# Patient Record
Sex: Female | Born: 1965 | Race: Black or African American | Hispanic: No | Marital: Married | Smoking: Former smoker
Health system: Southern US, Community
[De-identification: ages and names within clinical notes are randomized; demographics above are authoritative.]

## PROBLEM LIST (undated history)

## (undated) DIAGNOSIS — R5383 Other fatigue: Secondary | ICD-10-CM

## (undated) DIAGNOSIS — M359 Systemic involvement of connective tissue, unspecified: Principal | ICD-10-CM

## (undated) DIAGNOSIS — K2970 Gastritis, unspecified, without bleeding: Secondary | ICD-10-CM

## (undated) DIAGNOSIS — R232 Flushing: Secondary | ICD-10-CM

## (undated) DIAGNOSIS — M19042 Primary osteoarthritis, left hand: Secondary | ICD-10-CM

## (undated) DIAGNOSIS — M5136 Other intervertebral disc degeneration, lumbar region: Secondary | ICD-10-CM

## (undated) DIAGNOSIS — M170 Bilateral primary osteoarthritis of knee: Secondary | ICD-10-CM

## (undated) DIAGNOSIS — G43909 Migraine, unspecified, not intractable, without status migrainosus: Secondary | ICD-10-CM

## (undated) DIAGNOSIS — M2550 Pain in unspecified joint: Secondary | ICD-10-CM

## (undated) DIAGNOSIS — E559 Vitamin D deficiency, unspecified: Secondary | ICD-10-CM

## (undated) DIAGNOSIS — M19041 Primary osteoarthritis, right hand: Secondary | ICD-10-CM

## (undated) DIAGNOSIS — J45909 Unspecified asthma, uncomplicated: Secondary | ICD-10-CM

## (undated) HISTORY — DX: Migraine, unspecified, not intractable, without status migrainosus: G43.909

## (undated) HISTORY — DX: Flushing: R23.2

## (undated) HISTORY — DX: Pain in unspecified joint: M25.50

## (undated) HISTORY — DX: Primary osteoarthritis, right hand: M19.041

## (undated) HISTORY — PX: TUBAL LIGATION: SHX77

## (undated) HISTORY — DX: Bilateral primary osteoarthritis of knee: M17.0

## (undated) HISTORY — DX: Primary osteoarthritis, left hand: M19.042

## (undated) HISTORY — DX: Gastritis, unspecified, without bleeding: K29.70

## (undated) HISTORY — PX: BREAST BIOPSY: SHX20

## (undated) HISTORY — DX: Systemic involvement of connective tissue, unspecified: M35.9

## (undated) HISTORY — DX: Other fatigue: R53.83

## (undated) HISTORY — DX: Vitamin D deficiency, unspecified: E55.9

## (undated) HISTORY — DX: Other intervertebral disc degeneration, lumbar region: M51.36

---

## 2011-12-06 ENCOUNTER — Emergency Department (INDEPENDENT_AMBULATORY_CARE_PROVIDER_SITE_OTHER): Payer: 59

## 2011-12-06 ENCOUNTER — Encounter (HOSPITAL_COMMUNITY): Payer: Self-pay | Admitting: Emergency Medicine

## 2011-12-06 ENCOUNTER — Emergency Department (INDEPENDENT_AMBULATORY_CARE_PROVIDER_SITE_OTHER)
Admission: EM | Admit: 2011-12-06 | Discharge: 2011-12-06 | Disposition: A | Discharge status: Home / Self Care | Payer: 59 | Source: Home / Self Care | Attending: Family Medicine | Admitting: Family Medicine

## 2011-12-06 DIAGNOSIS — J209 Acute bronchitis, unspecified: Secondary | ICD-10-CM

## 2011-12-06 HISTORY — DX: Unspecified asthma, uncomplicated: J45.909

## 2011-12-06 MED ORDER — METHYLPREDNISOLONE ACETATE 40 MG/ML IJ SUSP
80.0000 mg | Freq: Once | INTRAMUSCULAR | Status: AC
  Administered 2011-12-06: 80 mg via INTRAMUSCULAR

## 2011-12-06 MED ORDER — HYDROCOD POLST-CHLORPHEN POLST 10-8 MG/5ML PO LQCR: 5.0000 mL | Freq: Two times a day (BID) | ORAL | Status: DC

## 2011-12-06 MED ORDER — METHYLPREDNISOLONE ACETATE 80 MG/ML IJ SUSP
INTRAMUSCULAR | Status: AC
  Filled 2011-12-06: qty 1

## 2011-12-06 NOTE — ED Notes (Signed)
PT HERE WITH C/O LOCALIZED LEFT CHEST WALL PAIN WITH DEEP BREATHING,SOB AND WHEEZING THAT STARTED X 1 WEEK AGI BUT HAS WORSENED IN LAST 2 DYS.USED INHALER WHICH SX SUBSIDED.SHARP,INTERMIT PAIN.ALSO C/O VOMITING TWICE AND POOR APPETITE.PT IS TAKING ATB'S FROM PREVIOUS BOIL INFECTION UNDER L AXILLA AND VAGINA PRESCRIBED BY ER IN [place].INSTRUCTED TO GOWN FOR POSS CHEST XRAY

## 2011-12-06 NOTE — ED Provider Notes (Signed)
History     CSN: [id number]  Arrival date & time 12/06/11  1842   First MD Initiated Contact with Patient 12/06/11 1845      Chief Complaint  Patient presents with  . Pleurisy  . Shortness of Breath  . Asthma    (Consider location/radiation/quality/duration/timing/severity/associated sxs/prior treatment) Patient is a 46 y.o. female presenting with shortness of breath and asthma. The history is provided by the patient.  Shortness of Breath  The current episode started 3 to 5 days ago (fever for 1 week, on doxy and keflex from md in [place] for boils ). The onset was gradual. The problem has been gradually worsening. The problem is mild. Associated symptoms include chest pain, rhinorrhea, cough and shortness of breath. Pertinent negatives include no wheezing. Her past medical history is significant for asthma.  Asthma Associated symptoms include chest pain and shortness of breath.    Past Medical History  Diagnosis Date  . Asthma     Past Surgical History  Procedure Date  . Tubal ligation     No family history on file.  History  Substance Use Topics  . Smoking status: Never Smoker   . Smokeless tobacco: Not on file  . Alcohol Use: No    OB History    Grav Para Term Preterm Abortions TAB SAB Ect Mult Living                  Review of Systems  Constitutional: Positive for appetite change.  HENT: Positive for congestion, rhinorrhea and postnasal drip.   Respiratory: Positive for cough and shortness of breath. Negative for wheezing.   Cardiovascular: Positive for chest pain.  Gastrointestinal: Negative.   Genitourinary: Negative.     Allergies  Sulfa antibiotics  Home Medications   Current Outpatient Rx  Name Route Sig Dispense Refill  . CEPHALEXIN 500 MG PO CAPS Oral Take 500 mg by mouth 4 (four) times daily.    Marland Kitchen DOXYCYCLINE HYCLATE 100 MG PO CPEP Oral Take 100 mg by mouth 2 (two) times daily.    Marland Kitchen HYDROCODONE-ACETAMINOPHEN 5-325 MG PO TABS Oral Take 1  tablet by mouth every 6 (six) hours as needed.    Marland Kitchen HYDROCOD POLST-CPM POLST ER 10-8 MG/5ML PO LQCR Oral Take 5 mLs by mouth every 12 (twelve) hours. 115 mL 0    BP 136/78  Pulse 110  Temp 99.4 F (37.4 C) (Oral)  Resp 21  SpO2 100%  LMP 11/30/2011  Physical Exam  Nursing note and vitals reviewed. Constitutional: She is oriented to person, place, and time. She appears well-developed and well-nourished.  HENT:  Head: Normocephalic.  Right Ear: External ear normal.  Left Ear: External ear normal.  Nose: Nose normal.  Mouth/Throat: Oropharynx is clear and moist.  Eyes: Pupils are equal, round, and reactive to light.  Neck: Normal range of motion. Neck supple.  Cardiovascular: Normal rate, regular rhythm, normal heart sounds and intact distal pulses.   Pulmonary/Chest: Effort normal and breath sounds normal. She has no wheezes. She exhibits tenderness.  Abdominal: Soft. Bowel sounds are normal.  Lymphadenopathy:    She has no cervical adenopathy.  Neurological: She is alert and oriented to person, place, and time.  Skin: Skin is warm and dry.    ED Course  Procedures (including critical care time)  Labs Reviewed - No data to display Dg Chest 2 View  12/06/2011  *RADIOLOGY REPORT*  Clinical Data: Fever with cough for 1 week.  Chest pain.  History of asthma.  CHEST - 2 VIEW  Comparison: None.  Findings: The heart size and mediastinal contours are normal.  The lungs demonstrate mild central airway thickening without hyperinflation or confluent airspace opacity.  There is no pleural effusion.  No osseous abnormalities are seen.  IMPRESSION: Mild central airway thickening consistent with bronchitis or asthma.  No evidence of pneumonia.   Original Report Authenticated By: Gerrianne Scale, M.D.      1. Bronchitis, acute       MDM          Linna Hoff, MD 12/06/11 2003

## 2012-01-08 ENCOUNTER — Telehealth: Payer: Self-pay | Admitting: Oncology

## 2012-01-08 NOTE — Telephone Encounter (Signed)
S/W pt in re NP appt 9/26 @ 1:30 w/ Dr. Clelia Croft.  Referring Dr. Cain Saupe Dx- WBC of 1.8 NP packet mailed

## 2012-01-09 ENCOUNTER — Other Ambulatory Visit: Payer: Self-pay | Admitting: Oncology

## 2012-01-09 ENCOUNTER — Telehealth: Payer: Self-pay | Admitting: Oncology

## 2012-01-09 DIAGNOSIS — D649 Anemia, unspecified: Secondary | ICD-10-CM

## 2012-01-09 NOTE — Telephone Encounter (Signed)
C/D on 9/24 for appt 9/26 °

## 2012-01-10 ENCOUNTER — Other Ambulatory Visit (HOSPITAL_COMMUNITY)
Admission: RE | Admit: 2012-01-10 | Discharge: 2012-01-10 | Disposition: A | Discharge status: Home / Self Care | Payer: BC Managed Care – PPO | Source: Ambulatory Visit | Attending: Otolaryngology | Admitting: Otolaryngology

## 2012-01-10 DIAGNOSIS — R220 Localized swelling, mass and lump, head: Secondary | ICD-10-CM | POA: Insufficient documentation

## 2012-01-11 ENCOUNTER — Telehealth: Payer: Self-pay | Admitting: Oncology

## 2012-01-11 ENCOUNTER — Encounter: Payer: Self-pay | Admitting: Oncology

## 2012-01-11 ENCOUNTER — Other Ambulatory Visit (HOSPITAL_BASED_OUTPATIENT_CLINIC_OR_DEPARTMENT_OTHER): Payer: 59 | Admitting: Lab

## 2012-01-11 ENCOUNTER — Ambulatory Visit: Payer: 59

## 2012-01-11 ENCOUNTER — Ambulatory Visit (HOSPITAL_BASED_OUTPATIENT_CLINIC_OR_DEPARTMENT_OTHER): Payer: BC Managed Care – PPO | Admitting: Oncology

## 2012-01-11 VITALS — BP 140/93 | HR 81 | Temp 97.6°F | Resp 20 | Ht 69.0 in | Wt 151.6 lb

## 2012-01-11 DIAGNOSIS — D649 Anemia, unspecified: Secondary | ICD-10-CM

## 2012-01-11 DIAGNOSIS — D72819 Decreased white blood cell count, unspecified: Secondary | ICD-10-CM

## 2012-01-11 DIAGNOSIS — R599 Enlarged lymph nodes, unspecified: Secondary | ICD-10-CM

## 2012-01-11 DIAGNOSIS — D72821 Monocytosis (symptomatic): Secondary | ICD-10-CM

## 2012-01-11 LAB — IRON AND TIBC
%SAT: 22 % (ref 20–55)
TIBC: 321 ug/dL (ref 250–470)
UIBC: 250 ug/dL (ref 125–400)

## 2012-01-11 LAB — CBC WITH DIFFERENTIAL/PLATELET
BASO%: 0.5 % (ref 0.0–2.0)
EOS%: 0.5 % (ref 0.0–7.0)
HCT: 30.9 % — ABNORMAL LOW (ref 34.8–46.6)
LYMPH%: 39.5 % (ref 14.0–49.7)
MCH: 25.7 pg (ref 25.1–34.0)
MCHC: 31.7 g/dL (ref 31.5–36.0)
MONO#: 0.4 10*3/uL (ref 0.1–0.9)
NEUT%: 40.9 % (ref 38.4–76.8)
Platelets: 231 10*3/uL (ref 145–400)
RBC: 3.82 10*6/uL (ref 3.70–5.45)
WBC: 2.2 10*3/uL — ABNORMAL LOW (ref 3.9–10.3)
lymph#: 0.9 10*3/uL (ref 0.9–3.3)

## 2012-01-11 LAB — COMPREHENSIVE METABOLIC PANEL (CC13)
ALT: 43 U/L (ref 0–55)
AST: 49 U/L — ABNORMAL HIGH (ref 5–34)
CO2: 26 mEq/L (ref 22–29)
Creatinine: 1.1 mg/dL (ref 0.6–1.1)
Sodium: 137 mEq/L (ref 136–145)
Total Bilirubin: 0.2 mg/dL (ref 0.20–1.20)
Total Protein: 9.3 g/dL — ABNORMAL HIGH (ref 6.4–8.3)

## 2012-01-11 LAB — CHCC SMEAR

## 2012-01-11 NOTE — Progress Notes (Signed)
CC:   Megan Fulp, MD  REASON FOR CONSULTATION:  Leukocytopenia and anemia.  HISTORY OF PRESENT ILLNESS:  [name] is a pleasant 46 year old African American woman who has lived in multiple locations, is originally from [place], and moved to [place] from [place] in the last 6 weeks.  She has a past medical history significant for asthma and migraines and for the most part in reasonable health.  She does report an episode back in February 2012 where she had presented with weakness, fatigue, and tiredness.  She was found to be leukocytopenic and anemic. She was diagnosed with a stomach ulcer.  As part of her workup, she reports that she had a bone marrow biopsy that was unremarkable.  Her leukocytopenia was attributed at that time to Topamax.  The patient recovered fully from that episode and had been doing relatively fair and re-established care here in [place] with Dr. Jillyn Hidden at [facility].  Upon evaluation, she started developing more of a constellation of symptoms of weakness, fatigue, tiredness, night sweats. She has had increased neck adenopathy, hoarseness, and cough and she was referred to ENT and was prescribed Augmentin.  Upon evaluation, it was felt that she had a left-sided cervical lymph node that was aspirated, the results of which are currently pending.  With that, she is still functional.  She is still able to work full-time.  She does report, as mentioned, fatigue and migraine headaches.  She does also endorse heavy menstrual cycles.  REVIEW OF SYSTEMS:  She did not report any headaches.  Did not report any blurry vision.  Did not report any double vision.  Did not report any seizures, alteration in mental status, psychiatric issues, or depression.  Did report fevers and chills.  Did report weight loss.  Did report night sweats, but reports more of hot flashes than sweats.  She does not report any chest pain or orthopnea.  Does not report any shortness  of breath.  Did not report any nausea.  Did not report any vomiting.  Did not report any hematochezia and melena.  Did not report any hematuria.  Did not report any bleeding problems.  She did not report any easy bruisability.  Rest of review of systems unremarkable.  PAST MEDICAL HISTORY:  Significant for asthma, history of migraines, history of gastritis.  MEDICATIONS:  She is on meloxicam and Augmentin.  ALLERGIES:  Sulfa.  SOCIAL HISTORY:  She is separated.  She has 4 children.  Her youngest is a 4 year old who lives with her at this time.  FAMILY HISTORY:  Father deceased of complications of shingles.  Mother had renal failure, possibly related to lupus.  PHYSICAL EXAMINATION:  General:  Alert, awake woman, appeared in no active distress.  Vital Signs:  Blood pressure is 140/93, pulse 81, respirations 20, temperature is 97, weighs 151 pounds.  ECOG performance status 0.  HEENT:  Head is normocephalic, atraumatic.  Pupils equal, round, and reactive to light.  Oral mucosa moist and pink.  Neck: Supple.  Very small pea-size lymph node noted in the cervical and submandibular area, predominantly on the left side.  Chest:  Clear to auscultation.  Abdomen:  Soft, nontender.  Heart:  Regular rate and rhythm.  Extremities:  No edema.  Could not appreciate any hepatosplenomegaly.  Could not appreciate any lymphadenopathy on her examination.  Peripheral smear was personally reviewed today.  Her CBC did show a hemoglobin of 9.8, white cell count of 2.2, platelet count of 231.  Her RDW  is 16.9 and her percent monocytes are increased to 18%.  Peripheral smear with my review today showed her red cells appeared slightly hypochromic, microcytic in nature.  She has increased platelets and platelet clumping.  The neutrophils did not appear dysplastic.  I did not appreciate any blasts.  There was is some monocytosis noted on her peripheral smear.  ASSESSMENT AND PLAN:  A 46 year old woman  with the following issues: 1. Leukocytopenia with a slight monocytosis.  The differential     diagnosis today discussed with [name].  This could be reactive     due to an acute illness, whether it is a bronchitis infection or     viral illness that can cause leukocytopenia and a monocytosis.     This could be also a sign of an autoimmune disease.  This could be     a fluctuating leukocytopenia due to ethnic variation and it is     possible that she could have a lymphoproliferative disorder.  I see     this as less of a possibility, given the fact that she has had an     extensive workup just about a year and a half ago with similar     complaints and a negative bone marrow biopsy.  However, she does     have lymph nodes on examination today and she does have a lot of     constitutional symptoms, so it is certainly possible she could have     that.  She had a fine needle aspiration of her lymph node, which     will certainly help.  I would like to obtain a full body scan to     fully work that up.  If she does have diffuse lymphadenopathy that     is suspicious or she has an abnormal finding on her fine-needle     aspiration, then a bone marrow biopsy should pursue.  I would like     to get the results of the fine-needle aspiration, as well as CT     scans before we make that final assessment. 2. Anemia.  Hemoglobin is 9.8.  She has increased RDW.  I am checking     her iron studies today to rule out an element of iron deficiency as     well.  All her questions were answered.  I will have her come back     in about 2 weeks to discuss these results.    ______________________________ Benjiman Core, M.D. FNS/MEDQ  D:  01/11/2012  T:  01/11/2012  Job:  [id number]

## 2012-01-11 NOTE — Progress Notes (Signed)
Note dictated

## 2012-01-11 NOTE — Addendum Note (Signed)
Addended by: Benjiman Core on: 01/11/2012 03:10 PM   Modules accepted: Orders

## 2012-01-11 NOTE — Progress Notes (Signed)
Checked in new pt with no financial concerns. °

## 2012-01-11 NOTE — Telephone Encounter (Signed)
Printed and gv appt to pt. °

## 2012-01-15 ENCOUNTER — Ambulatory Visit
Admission: RE | Admit: 2012-01-15 | Discharge: 2012-01-15 | Disposition: A | Discharge status: Home / Self Care | Payer: BC Managed Care – PPO | Source: Ambulatory Visit | Attending: Oncology | Admitting: Oncology

## 2012-01-15 DIAGNOSIS — R599 Enlarged lymph nodes, unspecified: Secondary | ICD-10-CM

## 2012-01-15 LAB — SPEP & IFE WITH QIG
Beta 2: 6.8 % — ABNORMAL HIGH (ref 3.2–6.5)
Beta Globulin: 5.6 % (ref 4.7–7.2)
Gamma Globulin: 38.9 % — ABNORMAL HIGH (ref 11.1–18.8)
IgA: 676 mg/dL — ABNORMAL HIGH (ref 69–380)
IgG (Immunoglobin G), Serum: 3510 mg/dL — ABNORMAL HIGH (ref 690–1700)
IgM, Serum: 164 mg/dL (ref 52–322)

## 2012-01-15 MED ORDER — IOHEXOL 300 MG/ML  SOLN
150.0000 mL | Freq: Once | INTRAMUSCULAR | Status: AC | PRN
  Administered 2012-01-15: 150 mL via INTRAVENOUS

## 2012-01-19 ENCOUNTER — Encounter: Payer: Self-pay | Admitting: Oncology

## 2012-01-19 NOTE — Progress Notes (Signed)
01/19/2012 Good Morning Dr. Clelia Croft,  The CT SCANS you ordered for this patient were denied by [facility].  They faxed me letter of explanation and Ms.McCloud should get her copy Saturday.  Their reason was that a Biopsy would need to be performed prior to the CT SCANS being performed per [employer] protocol.  [employer] is the authorizing agent for [employer] and [employer].  Thank you, Bonita Quin [phone]

## 2012-01-25 ENCOUNTER — Ambulatory Visit (HOSPITAL_BASED_OUTPATIENT_CLINIC_OR_DEPARTMENT_OTHER): Payer: BC Managed Care – PPO | Admitting: Oncology

## 2012-01-25 ENCOUNTER — Telehealth: Payer: Self-pay | Admitting: Oncology

## 2012-01-25 VITALS — BP 147/91 | HR 78 | Temp 98.8°F | Resp 20 | Ht 69.0 in | Wt 153.0 lb

## 2012-01-25 DIAGNOSIS — R599 Enlarged lymph nodes, unspecified: Secondary | ICD-10-CM

## 2012-01-25 DIAGNOSIS — D72821 Monocytosis (symptomatic): Secondary | ICD-10-CM

## 2012-01-25 DIAGNOSIS — D72819 Decreased white blood cell count, unspecified: Secondary | ICD-10-CM

## 2012-01-25 NOTE — Telephone Encounter (Signed)
gv and printed appt for Dec gv to pt.

## 2012-01-25 NOTE — Progress Notes (Signed)
Hematology and Oncology Follow Up Visit  Megan Reid [id number] [birth year] 46 y.o. 01/25/2012 4:12 PM   Principle Diagnosis: 46 year old with leukocytopenia, monocytosis and mild lymph node enlargement. Her work up is unrevealing.    Interim History:  Ms Megan Reid presents today for a follow up visit. I saw her for the first time on 9/26 with the above issues. CT scans are negative for any lymph nodes enlargement. Her fine needle aspiration of her neck node has been unraveling. She is doing better since her last visit. No fevers, chills. She is still tired at times. She is able to work and perform activity with decline.   Medications: I have reviewed the patient's current medications. No current outpatient prescriptions on file.  Allergies:  Allergies  Allergen Reactions  . Sulfa Antibiotics     Past Medical History, Surgical history, Social history, and Family History were reviewed and updated.  Review of Systems: Constitutional:  Negative for fever, chills, night sweats, anorexia, weight loss, pain. Cardiovascular: no chest pain or dyspnea on exertion Respiratory: negative Neurological: negative Dermatological: negative ENT: negative Skin: Negative. Gastrointestinal: negative Genito-Urinary: negative Hematological and Lymphatic: negative Breast: negative Musculoskeletal: negative Remaining ROS negative. Physical Exam: Blood pressure 147/91, pulse 78, temperature 98.8 F (37.1 C), temperature source Oral, resp. rate 20, height 5\' 9"  (1.753 m), weight 153 lb (69.4 kg), last menstrual period 12/25/2011. ECOG: 0 General appearance: alert Head: Normocephalic, without obvious abnormality, atraumatic Neck: no adenopathy, no carotid bruit, no JVD, supple, symmetrical, trachea midline and thyroid not enlarged, symmetric, no tenderness/mass/nodules Lymph nodes: Cervical, supraclavicular, and axillary nodes normal. Heart:regular rate and rhythm, S1, S2 normal, no murmur, click, rub  or gallop Lung:chest clear, no wheezing, rales, normal symmetric air entry Abdomin: soft, non-tender, without masses or organomegaly EXT:no erythema, induration, or nodules   Lab Results: Lab Results  Component Value Date   WBC 2.2* 01/11/2012   HGB 9.8* 01/11/2012   HCT 30.9* 01/11/2012   MCV 80.9 01/11/2012   PLT 231 01/11/2012     Chemistry      Component Value Date/Time   NA 137 01/11/2012 1349   K 4.3 01/11/2012 1349   CL 105 01/11/2012 1349   CO2 26 01/11/2012 1349   BUN 17.0 01/11/2012 1349   CREATININE 1.1 01/11/2012 1349      Component Value Date/Time   CALCIUM 9.8 01/11/2012 1349   ALKPHOS 66 01/11/2012 1349   AST 49* 01/11/2012 1349   ALT 43 01/11/2012 1349   BILITOT 0.20 01/11/2012 1349       Radiological Studies: CT NECK, CHEST, ABDOMEN AND PELVIS WITH CONTRAST  Technique: Multidetector CT imaging of the neck, chest, abdomen  and pelvis was performed using the standard protocol following the  bolus administration of intravenous contrast.  Contrast: OMNIPAQUE IOHEXOL 300 MG/ML SOLN  Comparison: None.  CT NECK  Findings: The area of patient concern was localized with a vitamin  E capsule. There is no evidence for enlarged cervical lymph nodes  deep to the capsule which was placed on the neck bilaterally, just  inferior to the mandibular angle. There are some very tiny lymph  nodes superficial to the sternocleidomastoid muscle on each side  which are in close proximity to the vitamin E capsules.  There is no segmental or submandibular lymphadenopathy although  small submental lymph nodes are visible. No deep level II or level  III lymphadenopathy.  Parotid glands are symmetric in size and appearance. There appears  to be some scattered  tiny calcifications within the parenchyma of  each parotid gland. The major salivary glands are symmetric in  size and shape.  Posterior soft tissues of the nasopharynx are unremarkable.  Epiglottis and aryepiglottic folds have  normal imaging features.  No mass effect identified in the oropharynx. Vocal cords are  unremarkable.  1.3 cm nodule is identified in the right thyroid lobe. A 0.8 cm  nodule is identified in the left thyroid lobe.  Major arterial and venous anatomy of the neck opacifies as  expected.  Bone windows reveal no worrisome lytic or sclerotic osseous  lesions.  IMPRESSION:  No evidence for lymphadenopathy in the neck. The patient only has  a very tiny superficial lymph nodes in the submental region and  superficial to the sternocleidomastoid muscle.  Bilateral thyroid nodules. Ultrasound evaluation recommended to  further characterize.  CT CHEST  Findings: Scattered small lymph nodes are seen in each axillary  region without axillary lymphadenopathy. No supraclavicular,  mediastinal, or hilar lymphadenopathy. Heart size is normal. No  pericardial or pleural effusion.  The lungs are clear without focal airspace consolidation. No  parenchymal nodule or mass.  Bone windows reveal no worrisome lytic or sclerotic osseous  lesions.  IMPRESSION:  No acute findings in the chest. Specifically, no evidence for  thoracic lymphadenopathy.  CT ABDOMEN AND PELVIS  Findings: No focal abnormalities seen in the liver or spleen. The  stomach, duodenum, pancreas, gallbladder, and adrenal glands are  unremarkable. The kidneys have normal imaging features.  No abdominal aortic aneurysm. There is no free fluid in the  abdomen. No retroperitoneal lymphadenopathy. No lymphadenopathy  in the hepato duodenal ligament or elsewhere.  The patient has a large stool volume in the abdominal segments of  the colon.  Imaging through the pelvis shows no intraperitoneal free fluid.  There is no pelvic sidewall lymphadenopathy. There are some lymph  nodes in both inguinal regions, the largest of which is on the left  and measures 1.5 x 1.0 cm. Uterus is unremarkable. No evidence  for adnexal mass.  Large volume of  formed stool is seen in the sigmoid colon. No  evidence for colonic diverticulitis. Cecum and right colon are  also prominently stool filled. The terminal ileum is normal. The  appendix is not visualized, but there is no edema or inflammation  in the region of the cecum.  9 mm anterior slip of L5 on S1 noted. Bone windows reveal no  worrisome lytic or sclerotic osseous lesions.  IMPRESSION:  No lymphadenopathy in the abdomen or pelvis. The largest single  node is on the left and measures 1.5 x 1.0 cm.    Impression and Plan: A 46 year old woman with the following issues:  1. 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 This appear to be reactive due to likely viral illness. No sign or symptoms of a lymphoproliferative disorder. I would continue to monitor this and repeat blood counts in 3 months.  CT scans results discussed today and did not show any abnormalities.   2. Anemia: work up is normal  at this time. This could be due to her recent illness. I will continue to watch. She did have a bone marrow biopsy 2 years ago that was negative at this time.     Eli Hose, MD 10/10/20134:12 PM

## 2012-01-31 ENCOUNTER — Encounter: Payer: Self-pay | Admitting: Dietician

## 2012-01-31 NOTE — Progress Notes (Signed)
Brief Out-patient Oncology Nutrition Note  Reason: Patient Screened Positive For Nutrition Risk For Unintentional Weight Loss and Decreased Appetite.   Patient returned RD phone call. She reported her appetite is good and she is gaining her weight back. She was without any nutrition questions or concerns. Provided RD contact information for future questions or concerns.   RD available for nutrition needs.   Iven Finn, MS, RD, LDN [phone]

## 2012-01-31 NOTE — Progress Notes (Signed)
Brief Out-patient Oncology Nutrition Note  Reason: Patient Screened Positive For Nutrition Risk For Unintentional Weight Loss and Decreased Appetite.   Mrs. Megan Reid is a 46 year old female patient of Dr. Clelia Croft, diagnosed with leukocytopenia. Attempted to contact patient  via telephone for positive nutrition risk. Left patient voicemail with RD contact information.   Wt Readings from Last 10 Encounters:  01/25/12 153 lb (69.4 kg)  01/11/12 151 lb 9.6 oz (68.765 kg)     RD available for nutrition needs.   Iven Finn, MS, RD, LDN [phone]

## 2012-03-27 ENCOUNTER — Ambulatory Visit: Payer: BC Managed Care – PPO | Admitting: Oncology

## 2012-03-27 ENCOUNTER — Other Ambulatory Visit: Payer: BC Managed Care – PPO | Admitting: Lab

## 2012-04-12 ENCOUNTER — Encounter (HOSPITAL_COMMUNITY): Payer: Self-pay | Admitting: Emergency Medicine

## 2012-04-12 ENCOUNTER — Emergency Department (HOSPITAL_COMMUNITY): Payer: BC Managed Care – PPO

## 2012-04-12 ENCOUNTER — Emergency Department (HOSPITAL_COMMUNITY)
Admission: EM | Admit: 2012-04-12 | Discharge: 2012-04-12 | Disposition: A | Discharge status: Home / Self Care | Payer: BC Managed Care – PPO | Attending: Emergency Medicine | Admitting: Emergency Medicine

## 2012-04-12 DIAGNOSIS — J45909 Unspecified asthma, uncomplicated: Secondary | ICD-10-CM | POA: Insufficient documentation

## 2012-04-12 DIAGNOSIS — R079 Chest pain, unspecified: Secondary | ICD-10-CM | POA: Insufficient documentation

## 2012-04-12 LAB — CBC WITH DIFFERENTIAL/PLATELET
Basophils Absolute: 0 10*3/uL (ref 0.0–0.1)
Basophils Relative: 0 % (ref 0–1)
Eosinophils Absolute: 0 10*3/uL (ref 0.0–0.7)
Hemoglobin: 11.1 g/dL — ABNORMAL LOW (ref 12.0–15.0)
MCH: 26.2 pg (ref 26.0–34.0)
MCHC: 32.1 g/dL (ref 30.0–36.0)
Monocytes Relative: 9 % (ref 3–12)
Neutro Abs: 3.7 10*3/uL (ref 1.7–7.7)
Neutrophils Relative %: 78 % — ABNORMAL HIGH (ref 43–77)
Platelets: 139 10*3/uL — ABNORMAL LOW (ref 150–400)
RDW: 13.6 % (ref 11.5–15.5)

## 2012-04-12 LAB — BASIC METABOLIC PANEL
BUN: 12 mg/dL (ref 6–23)
GFR calc Af Amer: >90 mL/min (ref >90)
GFR calc non Af Amer: >90 mL/min (ref >90)
Potassium: 3.8 mEq/L (ref 3.5–5.1)

## 2012-04-12 LAB — POCT I-STAT TROPONIN I: Troponin i, poc: 0 ng/mL (ref 0.00–0.08)

## 2012-04-12 MED ORDER — ASPIRIN 81 MG PO CHEW
324.0000 mg | CHEWABLE_TABLET | Freq: Once | ORAL | Status: AC
  Administered 2012-04-12: 324 mg via ORAL
  Filled 2012-04-12: qty 4

## 2012-04-12 MED ORDER — SODIUM CHLORIDE 0.9 % IV BOLUS (SEPSIS)
1000.0000 mL | Freq: Once | INTRAVENOUS | Status: AC
  Administered 2012-04-12: 1000 mL via INTRAVENOUS

## 2012-04-12 MED ORDER — OXYCODONE-ACETAMINOPHEN 5-325 MG PO TABS: 1.0000 | ORAL_TABLET | Freq: Four times a day (QID) | ORAL | Status: DC | PRN

## 2012-04-12 MED ORDER — ONDANSETRON HCL 4 MG/2ML IJ SOLN
4.0000 mg | Freq: Once | INTRAMUSCULAR | Status: AC
  Administered 2012-04-12: 4 mg via INTRAVENOUS
  Filled 2012-04-12: qty 2

## 2012-04-12 MED ORDER — IPRATROPIUM BROMIDE 0.02 % IN SOLN
0.5000 mg | Freq: Once | RESPIRATORY_TRACT | Status: AC
  Administered 2012-04-12: 0.5 mg via RESPIRATORY_TRACT
  Filled 2012-04-12: qty 2.5

## 2012-04-12 MED ORDER — IOHEXOL 350 MG/ML SOLN
100.0000 mL | Freq: Once | INTRAVENOUS | Status: AC | PRN
  Administered 2012-04-12: 100 mL via INTRAVENOUS

## 2012-04-12 MED ORDER — MORPHINE SULFATE 4 MG/ML IJ SOLN
4.0000 mg | Freq: Once | INTRAMUSCULAR | Status: AC
  Administered 2012-04-12: 4 mg via INTRAVENOUS
  Filled 2012-04-12: qty 1

## 2012-04-12 MED ORDER — ALBUTEROL SULFATE (5 MG/ML) 0.5% IN NEBU
5.0000 mg | INHALATION_SOLUTION | Freq: Once | RESPIRATORY_TRACT | Status: AC
  Administered 2012-04-12: 5 mg via RESPIRATORY_TRACT
  Filled 2012-04-12: qty 40

## 2012-04-12 NOTE — ED Provider Notes (Signed)
History     CSN: [id number]  Arrival date & time 04/12/12  1500   First MD Initiated Contact with Patient 04/12/12 1925      Chief Complaint  Patient presents with  . Chest Pain    (Consider location/radiation/quality/duration/timing/severity/associated sxs/prior treatment) HPI Pt presenting with c/o chest pain in left side of chest which is worse with movement and deep breathing.  Pain has been present for several days.  She has also had a nonproductive cough.  Has also had subjective fever with diffuse body aches.  Was seen at her PMD office this morning and given an albuterol treatment which she states did not help her symptoms.  Has had a decreased appetite as well and drinking less liquids.  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  States she does have a hx of possible lymphoma which she is undergoing evaluation for currently.  There are no other associated systemic symptoms, there are no other alleviating or modifying factors.   Past Medical History  Diagnosis Date  . Asthma     Past Surgical History  Procedure Date  . Tubal ligation     History reviewed. No pertinent family history.  History  Substance Use Topics  . Smoking status: Never Smoker   . Smokeless tobacco: Not on file  . Alcohol Use: No    OB History    Grav Para Term Preterm Abortions TAB SAB Ect Mult Living                  Review of Systems ROS reviewed and all otherwise negative except for mentioned in HPI  Allergies  Strawberry and Sulfa antibiotics  Home Medications   Current Outpatient Rx  Name  Route  Sig  Dispense  Refill  . ALBUTEROL SULFATE HFA 108 (90 BASE) MCG/ACT IN AERS   Inhalation   Inhale 2 puffs into the lungs every 6 (six) hours as needed. For shortness of breath         . BUDESONIDE-FORMOTEROL FUMARATE 160-4.5 MCG/ACT IN AERO   Inhalation   Inhale 2 puffs into the lungs every 4 (four) hours as needed. For shortness of breath         .  OXYCODONE-ACETAMINOPHEN 5-325 MG PO TABS   Oral   Take 1-2 tablets by mouth every 6 (six) hours as needed for pain.   15 tablet   0     BP 137/85  Pulse 101  Temp 97.8 F (36.6 C) (Oral)  Resp 21  SpO2 100%  LMP 03/01/2012 Vitals reviewed Physical Exam Physical Examination: General appearance - alert, uncomfortable appearing, and in no distress Mental status - alert, oriented to person, place, and time Eyes - no scleral icterus, no conjunctival injection Mouth - mucous membranes moist, pharynx normal without lesions Neck - supple, no significant adenopathy Chest - clear to auscultation, no wheezes, rales or rhonchi, symmetric air entry, nontender to palpation but pain worse with positional movement in bed Heart - normal rate, regular rhythm, normal S1, S2, no murmurs, rubs, clicks or gallops Abdomen - soft, nontender, nondistended, no masses or organomegaly Extremities - peripheral pulses normal, no pedal edema, no clubbing or cyanosis Skin - normal coloration and turgor, no rashes  ED Course  Procedures (including critical care time)   Date: 04/12/2012  Rate: 114  Rhythm: sinus tachycardia  QRS Axis: normal  Intervals: normal  ST/T Wave abnormalities: normal  Conduction Disutrbances:none  Narrative Interpretation:   Old EKG Reviewed: none available  11:18 PM pt feels much improved after pain medication.  HR is down to 99 on monitor during my evaluation.  Chest CT without any acute abnormalities.  All results d/w patient.    Labs Reviewed  CBC WITH DIFFERENTIAL - Abnormal; Notable for the following:    Hemoglobin 11.1 (*)     HCT 34.6 (*)     Platelets 139 (*)     Neutrophils Relative 78 (*)     Lymphs Abs 0.6 (*)     All other components within normal limits  BASIC METABOLIC PANEL - Abnormal; Notable for the following:    Sodium 134 (*)     Glucose, Bld 106 (*)     All other components within normal limits  D-DIMER, QUANTITATIVE - Abnormal; Notable for the  following:    D-Dimer, Quant 3.49 (*)     All other components within normal limits  POCT I-STAT TROPONIN I  LAB REPORT - SCANNED   Dg Chest 2 View  04/12/2012  *RADIOLOGY REPORT*  Clinical Data: Chest pain and shortness of breath.  CHEST - 2 VIEW  Comparison: 12/06/2011 study.  Findings: Cardiac silhouette is normal size and shape.  Mediastinal and hilar contours appear stable.  No pulmonary infiltrates or masses are evident. No pleural abnormality is evident.  There is slight scoliosis convexity to the right. Bones appear average for age.  IMPRESSION: No acute or active cardiopulmonary or pleural abnormalities are evident.   Original Report Authenticated By: Onalee Hua Call    Ct Angio Chest Pe W/cm &/or Wo Cm  04/12/2012  *RADIOLOGY REPORT*  Clinical Data: Short of breath, tachycardia.  Diagnosis of lymphoma.  CT ANGIOGRAPHY CHEST  Technique:  Multidetector CT imaging of the chest using the standard protocol during bolus administration of intravenous contrast. Multiplanar reconstructed images including MIPs were obtained and reviewed to evaluate the vascular anatomy.  Contrast:  100 ml Isovue  Comparison: The the CT chest 01/15/2012 and  Findings: No filling defects within pulmonary arteries to suggest acute pulmonary embolism.  No acute findings of the aorta or great vessels.  No pericardial fluid.  Esophagus is normal.  No mediastinal lymphadenopathy.  Review of the lung parenchyma demonstrates no consolidation or pleural fluid.  No pneumothorax.  Several ill-defined nodules in the left lower lobe measuring 2-3 mm.  These could represent small f granulomas.  They appear stable from prior.  There is mild axillary lymphadenopathy unchanged from prior. Limited view of the upper abdomen shows normal size spleen.  No aggressive osseous lesions.  IMPRESSION:  1.  No evidence of acute pulmonary embolism. 2.  Stable small lower lobe pulmonary nodules on the left. 3.  Mild axillary adenopathy is unchanged from  prior.   Original Report Authenticated By: Genevive Bi, M.D.      1. Chest pain       MDM  Pt presenting with anterior chest wall pain with shortness of breath.  Pt given albuterol/atrovent, also CT angio obtained due to tachycardia and pleuritic chest pain.  This was negative for acute findings.  Pt hydrated with IV fluid, given pain meds and feels much improved upon discharge.  Low suspicion for ACS and patient had negative troponin after 2 days of constant chest pain.  Discharged with strict return precautions.  Pt agreeable with plan.        Ethelda Chick, MD 04/13/12 2020

## 2012-04-12 NOTE — ED Notes (Signed)
Pt c/o left sided CP worse with inspiration and cough x several days; pt sts chills and body aches also

## 2012-04-12 NOTE — ED Notes (Signed)
Pt dc to home.  Pt states understanding to dc paperwork.  Pt will f/u with pcp.  Pt taken by w/c to car.

## 2012-04-16 ENCOUNTER — Emergency Department (HOSPITAL_COMMUNITY)
Admission: EM | Admit: 2012-04-16 | Discharge: 2012-04-17 | Disposition: A | Discharge status: Home / Self Care | Payer: BC Managed Care – PPO | Attending: Emergency Medicine | Admitting: Emergency Medicine

## 2012-04-16 ENCOUNTER — Encounter (HOSPITAL_COMMUNITY): Payer: Self-pay | Admitting: *Deleted

## 2012-04-16 DIAGNOSIS — R0789 Other chest pain: Secondary | ICD-10-CM

## 2012-04-16 DIAGNOSIS — J3489 Other specified disorders of nose and nasal sinuses: Secondary | ICD-10-CM | POA: Insufficient documentation

## 2012-04-16 DIAGNOSIS — R000 Tachycardia, unspecified: Secondary | ICD-10-CM

## 2012-04-16 DIAGNOSIS — J45909 Unspecified asthma, uncomplicated: Secondary | ICD-10-CM | POA: Insufficient documentation

## 2012-04-16 DIAGNOSIS — Z79899 Other long term (current) drug therapy: Secondary | ICD-10-CM | POA: Insufficient documentation

## 2012-04-16 DIAGNOSIS — F411 Generalized anxiety disorder: Secondary | ICD-10-CM | POA: Insufficient documentation

## 2012-04-16 DIAGNOSIS — M2550 Pain in unspecified joint: Secondary | ICD-10-CM | POA: Insufficient documentation

## 2012-04-16 DIAGNOSIS — I498 Other specified cardiac arrhythmias: Secondary | ICD-10-CM | POA: Insufficient documentation

## 2012-04-16 DIAGNOSIS — Z7982 Long term (current) use of aspirin: Secondary | ICD-10-CM | POA: Insufficient documentation

## 2012-04-16 DIAGNOSIS — R0602 Shortness of breath: Secondary | ICD-10-CM | POA: Insufficient documentation

## 2012-04-16 DIAGNOSIS — IMO0001 Reserved for inherently not codable concepts without codable children: Secondary | ICD-10-CM | POA: Insufficient documentation

## 2012-04-16 DIAGNOSIS — R6883 Chills (without fever): Secondary | ICD-10-CM | POA: Insufficient documentation

## 2012-04-16 DIAGNOSIS — R61 Generalized hyperhidrosis: Secondary | ICD-10-CM | POA: Insufficient documentation

## 2012-04-16 DIAGNOSIS — R9431 Abnormal electrocardiogram [ECG] [EKG]: Secondary | ICD-10-CM

## 2012-04-16 LAB — CBC
HCT: 32.1 % — ABNORMAL LOW (ref 36.0–46.0)
HCT: 32.5 % — ABNORMAL LOW (ref 36.0–46.0)
Hemoglobin: 10.6 g/dL — ABNORMAL LOW (ref 12.0–15.0)
Hemoglobin: 10.9 g/dL — ABNORMAL LOW (ref 12.0–15.0)
MCH: 26.6 pg (ref 26.0–34.0)
MCH: 26.8 pg (ref 26.0–34.0)
MCHC: 33 g/dL (ref 30.0–36.0)
MCHC: 33.5 g/dL (ref 30.0–36.0)
MCV: 80.5 fL (ref 78.0–100.0)
MCV: 80 fL (ref 78.0–100.0)
Platelets: 186 K/uL (ref 150–400)
Platelets: 184 K/uL (ref 150–400)
RBC: 3.99 MIL/uL (ref 3.87–5.11)
RBC: 4.06 MIL/uL (ref 3.87–5.11)
RDW: 13.2 % (ref 11.5–15.5)
RDW: 13.1 % (ref 11.5–15.5)
WBC: 7 10*3/uL (ref 4.0–10.5)
WBC: 6.9 10*3/uL (ref 4.0–10.5)

## 2012-04-16 LAB — PRO B NATRIURETIC PEPTIDE: Pro B Natriuretic peptide (BNP): 432.3 pg/mL — ABNORMAL HIGH (ref 0–125)

## 2012-04-16 LAB — BASIC METABOLIC PANEL
BUN: 19 mg/dL (ref 6–23)
BUN: 19 mg/dL (ref 6–23)
CO2: 28 mEq/L (ref 19–32)
Chloride: 97 mEq/L (ref 96–112)
Chloride: 99 mEq/L (ref 96–112)
Creatinine, Ser: 0.81 mg/dL (ref 0.50–1.10)
GFR calc Af Amer: >90 mL/min (ref >90)
Glucose, Bld: 113 mg/dL — ABNORMAL HIGH (ref 70–99)
Potassium: 3.9 mEq/L (ref 3.5–5.1)

## 2012-04-16 LAB — BASIC METABOLIC PANEL WITH GFR
CO2: 27 meq/L (ref 19–32)
Calcium: 10 mg/dL (ref 8.4–10.5)
Calcium: 10 mg/dL (ref 8.4–10.5)
Creatinine, Ser: 0.79 mg/dL (ref 0.50–1.10)
GFR calc Af Amer: >90 mL/min (ref >90)
GFR calc non Af Amer: 85 mL/min — ABNORMAL LOW (ref >90)
GFR calc non Af Amer: >90 mL/min (ref >90)
Glucose, Bld: 106 mg/dL — ABNORMAL HIGH (ref 70–99)
Potassium: 3.2 meq/L — ABNORMAL LOW (ref 3.5–5.1)
Sodium: 135 meq/L (ref 135–145)
Sodium: 137 meq/L (ref 135–145)

## 2012-04-16 MED ORDER — POTASSIUM CHLORIDE IN NACL 20-0.9 MEQ/L-% IV SOLN
Freq: Once | INTRAVENOUS | Status: AC
  Administered 2012-04-16: 22:00:00 via INTRAVENOUS
  Filled 2012-04-16: qty 1000

## 2012-04-16 MED ORDER — LORAZEPAM 2 MG/ML IJ SOLN
INTRAMUSCULAR | Status: AC
  Filled 2012-04-16: qty 1

## 2012-04-16 MED ORDER — SODIUM CHLORIDE 0.9 % IV BOLUS (SEPSIS)
500.0000 mL | Freq: Once | INTRAVENOUS | Status: AC
  Administered 2012-04-16: 500 mL via INTRAVENOUS

## 2012-04-16 MED ORDER — LORAZEPAM 2 MG/ML IJ SOLN
1.0000 mg | Freq: Once | INTRAMUSCULAR | Status: DC
  Filled 2012-04-16: qty 2

## 2012-04-16 MED ORDER — FENTANYL CITRATE 0.05 MG/ML IJ SOLN
50.0000 ug | Freq: Once | INTRAMUSCULAR | Status: AC
  Administered 2012-04-16: 50 ug via INTRAVENOUS
  Filled 2012-04-16: qty 2

## 2012-04-16 MED ORDER — KETOROLAC TROMETHAMINE 30 MG/ML IJ SOLN
30.0000 mg | Freq: Once | INTRAMUSCULAR | Status: AC
  Administered 2012-04-16: 30 mg via INTRAVENOUS
  Filled 2012-04-16: qty 1

## 2012-04-16 MED ORDER — SODIUM CHLORIDE 0.9 % IV SOLN
Freq: Once | INTRAVENOUS | Status: AC
  Administered 2012-04-16: 23:00:00 via INTRAVENOUS

## 2012-04-16 NOTE — ED Provider Notes (Addendum)
History     CSN: [id number]  Arrival date & time 04/16/12  1748   First MD Initiated Contact with Patient 04/16/12 2022      Chief Complaint  Patient presents with  . Chest Pain  . Shortness of Breath    (Consider location/radiation/quality/duration/timing/severity/associated sxs/prior treatment) HPI Comments: Patient reports approximately 5 days of worsening shortness of breath with some sharp chest pain that initially started in her lower rib cage on the left flank area but then moved to her anterior left chest area. She reports cough for deep breath or significant movements of her torso seems to worsen the pain. However movement of her arms and shoulders does not reproduce the pain. She denies any trauma. She also endorses some subjective fever or chills, occasional sweats, body aches and joint aches. She did have her influenza vaccination. She reports 4 days ago she went to the urgent care were given her some breathing treatments but was not feeling significantly improved and then was referred to the emergency department for additional care. While in the emergency department, she had blood tests, an EKG and even a CAT scan of her chest which showed apparently no significant abnormalities and she was released home with a prescription for analgesics which he did not fill. She reports she is extremely concerned because she has family members who have died of heart attacks and congestive heart failure. She reports that she has a history of asthma and has taken her inhalers without significant improvement. She denies history of smoking, no obvious sick contacts, no prolonged travel. No family history of thyroid problems. She denies any drug use.  Patient is a 46 y.o. female presenting with chest pain and shortness of breath. The history is provided by the patient and medical records.  Chest Pain Primary symptoms include shortness of breath. Pertinent negatives for primary symptoms include no  wheezing, no palpitations, no abdominal pain, no nausea, no vomiting and no dizziness.  Associated symptoms include diaphoresis.    Shortness of Breath  Associated symptoms include chest pain, rhinorrhea and shortness of breath. Pertinent negatives include no wheezing.    Past Medical History  Diagnosis Date  . Asthma     Past Surgical History  Procedure Date  . Tubal ligation     History reviewed. No pertinent family history.  History  Substance Use Topics  . Smoking status: Never Smoker   . Smokeless tobacco: Not on file  . Alcohol Use: No    OB History    Grav Para Term Preterm Abortions TAB SAB Ect Mult Living                  Review of Systems  Constitutional: Positive for chills and diaphoresis.  HENT: Positive for congestion and rhinorrhea.   Respiratory: Positive for shortness of breath. Negative for wheezing.   Cardiovascular: Positive for chest pain. Negative for palpitations and leg swelling.  Gastrointestinal: Negative for nausea, vomiting and abdominal pain.  Musculoskeletal: Positive for myalgias and arthralgias.  Neurological: Negative for dizziness and light-headedness.  Psychiatric/Behavioral: The patient is nervous/anxious.   All other systems reviewed and are negative.    Allergies  Strawberry and Sulfa antibiotics  Home Medications   Current Outpatient Rx  Name  Route  Sig  Dispense  Refill  . ALBUTEROL SULFATE HFA 108 (90 BASE) MCG/ACT IN AERS   Inhalation   Inhale 2 puffs into the lungs every 6 (six) hours as needed. For shortness of breath         .  ASPIRIN EC 325 MG PO TBEC   Oral   Take 325 mg by mouth 2 (two) times daily as needed. For pain         . BUDESONIDE-FORMOTEROL FUMARATE 160-4.5 MCG/ACT IN AERO   Inhalation   Inhale 2 puffs into the lungs every 4 (four) hours as needed. For shortness of breath           BP 133/51  Pulse 121  Temp 98.5 F (36.9 C) (Oral)  Resp 32  SpO2 98%  LMP 03/01/2012  Physical  Exam  Nursing note and vitals reviewed. Constitutional: She appears well-developed and well-nourished. No distress.  HENT:  Head: Normocephalic and atraumatic.  Eyes: EOM are normal.  Neck: Normal range of motion. Neck supple.  Cardiovascular: Regular rhythm and intact distal pulses.  Tachycardia present.   No murmur heard. Pulmonary/Chest: Tachypnea noted. She has no wheezes. She has no rales.  Abdominal: Soft. She exhibits no distension. There is no tenderness.  Musculoskeletal: She exhibits no edema.  Neurological: She is alert. She exhibits normal muscle tone.  Skin: Skin is warm. No rash noted. She is diaphoretic.  Psychiatric: Her mood appears anxious. Her speech is not rapid and/or pressured.    ED Course  Procedures (including critical care time)  Labs Reviewed  CBC - Abnormal; Notable for the following:    Hemoglobin 10.6 (*)     HCT 32.1 (*)     All other components within normal limits  BASIC METABOLIC PANEL - Abnormal; Notable for the following:    Potassium 3.2 (*)     Glucose, Bld 106 (*)     GFR calc non Af Amer 85 (*)     All other components within normal limits  PRO B NATRIURETIC PEPTIDE - Abnormal; Notable for the following:    Pro B Natriuretic peptide (BNP) 432.3 (*)     All other components within normal limits  CBC - Abnormal; Notable for the following:    Hemoglobin 10.9 (*)     HCT 32.5 (*)     All other components within normal limits  BASIC METABOLIC PANEL - Abnormal; Notable for the following:    Glucose, Bld 113 (*)     All other components within normal limits  POCT I-STAT TROPONIN I  INFLUENZA PANEL BY PCR  TSH   No results found.   1. Atypical chest pain   2. Prolonged Q-T interval on ECG   3. Abnormal ECG   4. Sinus tachycardia      Room air oxygenation is 98% I interpret this to be normal.  EKG performed at time 18:05 shows a sinus tachycardia with a ventricular rate of 129. PR interval is shortened at 104 ms. Also noted is a  prolonged QT at 580 ms. Nonspecific, flattened T waves seen diffusely. Shortened P-R, prolonged QT and nonspecific T-wave abnormalities are changed compared to EKG from 04/12/2012.  11:47 PM After IV fluids, IV potassium, IV fentanyl and Toradol, the patient reports feeling much improved. Her heart rate is improved at a rate of 103. Pressure remains stable.   My plan is to repeat her EKG. If her ECG is improved, I feel the patient is stable to be discharged home. If she continues to have prolonged QT interval, we'll consider observation admission. That way influenza and TSH studies can be followed closely here. Otherwise her primary care physician can followup on the results as an outpatient.   11:57 PM EKG is performed at 23:51 shows sinus tachycardia  at a rate of 100. PR interval is normal. R-wave progression is poor between leads V1 through V3. No ST or T-wave abnormalities. QT interval is normal as compared to prior EKG earlier this evening. Given that her heart rate is now normal, her EKG has normalized, will give patient potassium supplementation at home, she was given pain prescription previously and she is deemed safe for discharge for outpatient followup.  MDM   Patient initially with a low potassium at 3.2. IV fluids with supplemental potassium is given considering the EKG abnormality seen. Patient with no significant is factors for coronary disease. She had a CT scan or days ago which ruled out pulmonary infection, pulmonary embolism. Here 20 care troponin is normal, patient has mild anemia which appears to be stable. She is tachycardic and tachypneic, does appear anxious which is understandable given her family risk factors. Patient is given verbal reassurance here, she is given IV Toradol and fentanyl for pain relief. Recheck of her potassium shows that it is improved after IV supplementation is given. Her home medications include albuterol and subcortical.        Gavin Pound. Oletta Lamas,  MD 04/16/12 2359  Gavin Pound. Tayia Stonesifer, MD 04/30/12 [phone]

## 2012-04-16 NOTE — ED Notes (Addendum)
Unable to obtain IV access at this time. 2nd RN unavailable. IV team paged. Pt aware.

## 2012-04-16 NOTE — ED Notes (Signed)
IV team at bedside 

## 2012-04-16 NOTE — ED Notes (Addendum)
Pt reports SOB and chest pain since Friday. 10/10. "Shap stabbing pain. It feels like I am drowning when I lay flat.  Saw PCP Dr Jordan Hawks today for chest pain. Reports elevated heart rate of 137. States PCP sent her here. Denies Nausea and vomiting. Reports episodes of dizziness on Sunday. Non currently.

## 2012-04-16 NOTE — ED Notes (Signed)
Pt was here on 12/27 for chest pain and sob, was sent home and told to follow up with pcp. Went to md today and told to come back due to still having cp and sob. Pain is left and right side of chest, sharp when coughing and breathing. HR 120's. ekg done at triage.

## 2012-04-16 NOTE — Discharge Instructions (Signed)
Nonspecific Tachycardia  Tachycardia is a faster than normal heartbeat (more than 100 beats per minute). In adults, the heart normally beats between 60 and 100 times a minute. A fast heartbeat may be a normal response to exercise or stress. It does not necessarily mean that something is wrong. However, sometimes when your heart beats too fast it may not be able to pump enough blood to the rest of your body. This can result in chest pain, shortness of breath, dizziness, and even fainting. Nonspecific tachycardia means that the specific cause or pattern of your tachycardia is unknown.  CAUSES   Tachycardia may be harmless or it may be due to a more serious underlying cause. Possible causes of tachycardia include:   Exercise or exertion.   Fever.   Pain or injury.   Infection.   Loss of body fluids (dehydration).   Overactive thyroid.   Lack of red blood cells (anemia).   Anxiety and stress.   Alcohol.   Caffeine.   Tobacco products.   Diet pills.   Illegal drugs.   Heart disease.  SYMPTOMS   Rapid or irregular heartbeat (palpitations).   Suddenly feeling your heart beating (cardiac awareness).   Dizziness.   Tiredness (fatigue).   Shortness of breath.   Chest pain.   Nausea.   Fainting.  DIAGNOSIS   Your caregiver will perform a physical exam and take your medical history. In some cases, a heart specialist (cardiologist) may be consulted. Your caregiver may also order:   Blood tests.   Electrocardiography. This test records the electrical activity of your heart.   A heart monitoring test.  TREATMENT   Treatment will depend on the likely cause of your tachycardia. The goal is to treat the underlying cause of your tachycardia. Treatment methods may include:   Replacement of fluids or blood through an intravenous (IV) tube for moderate to severe dehydration or anemia.   New medicines or changes in your current medicines.   Diet and lifestyle changes.   Treatment for certain  infections.   Stress relief or relaxation methods.  HOME CARE INSTRUCTIONS    Rest.   Drink enough fluids to keep your urine clear or pale yellow.   Do not smoke.   Avoid:   Caffeine.   Tobacco.   Alcohol.   Chocolate.   Stimulants such as over-the-counter diet pills or pills that help you stay awake.   Situations that cause anxiety or stress.   Illegal drugs such as marijuana, phencyclidine (PCP), and cocaine.   Only take medicine as directed by your caregiver.   Keep all follow-up appointments as directed by your caregiver.  SEEK IMMEDIATE MEDICAL CARE IF:    You have pain in your chest, upper arms, jaw, or neck.   You become weak, dizzy, or feel faint.   You have palpitations that will not go away.   You vomit, have diarrhea, or pass blood in your stool.   Your skin is cool, pale, and wet.   You have a fever that will not go away with rest, fluids, and medicine.  MAKE SURE YOU:    Understand these instructions.   Will watch your condition.   Will get help right away if you are not doing well or get worse.  Document Released: 05/11/2004 Document Revised: 06/26/2011 Document Reviewed: 03/14/2011  [employer] Patient Information 2013 [employer]

## 2012-04-17 LAB — TSH: TSH: 2.267 u[IU]/mL (ref 0.350–4.500)

## 2012-04-17 MED ORDER — POTASSIUM CHLORIDE CRYS ER 10 MEQ PO TBCR: 10.0000 meq | EXTENDED_RELEASE_TABLET | Freq: Two times a day (BID) | ORAL | Status: DC

## 2012-04-17 NOTE — ED Notes (Addendum)
Pt A.O.x 4. Respirations even and regular. Vitals stable. NAD. Verbalized understanding of diagnosis, medication application, and follow up care instructions. Ambulatory with no assist. NAD.

## 2012-04-19 ENCOUNTER — Emergency Department (HOSPITAL_COMMUNITY): Payer: BC Managed Care – PPO

## 2012-04-19 ENCOUNTER — Encounter (HOSPITAL_COMMUNITY): Payer: Self-pay | Admitting: Emergency Medicine

## 2012-04-19 ENCOUNTER — Emergency Department (HOSPITAL_COMMUNITY)
Admission: EM | Admit: 2012-04-19 | Discharge: 2012-04-19 | Disposition: A | Discharge status: Home / Self Care | Payer: BC Managed Care – PPO | Attending: Emergency Medicine | Admitting: Emergency Medicine

## 2012-04-19 DIAGNOSIS — J45909 Unspecified asthma, uncomplicated: Secondary | ICD-10-CM | POA: Insufficient documentation

## 2012-04-19 DIAGNOSIS — Z9851 Tubal ligation status: Secondary | ICD-10-CM | POA: Insufficient documentation

## 2012-04-19 DIAGNOSIS — R0602 Shortness of breath: Secondary | ICD-10-CM

## 2012-04-19 DIAGNOSIS — R079 Chest pain, unspecified: Secondary | ICD-10-CM

## 2012-04-19 DIAGNOSIS — Z79899 Other long term (current) drug therapy: Secondary | ICD-10-CM | POA: Insufficient documentation

## 2012-04-19 LAB — PRO B NATRIURETIC PEPTIDE: Pro B Natriuretic peptide (BNP): 366.3 pg/mL — ABNORMAL HIGH (ref 0–125)

## 2012-04-19 LAB — CBC
HCT: 29.8 % — ABNORMAL LOW (ref 36.0–46.0)
Hemoglobin: 9.7 g/dL — ABNORMAL LOW (ref 12.0–15.0)
MCHC: 32.6 g/dL (ref 30.0–36.0)
RBC: 3.66 MIL/uL — ABNORMAL LOW (ref 3.87–5.11)

## 2012-04-19 LAB — BASIC METABOLIC PANEL
BUN: 11 mg/dL (ref 6–23)
CO2: 29 mEq/L (ref 19–32)
Chloride: 102 mEq/L (ref 96–112)
GFR calc non Af Amer: >90 mL/min (ref >90)
Glucose, Bld: 90 mg/dL (ref 70–99)
Potassium: 3.9 mEq/L (ref 3.5–5.1)
Sodium: 138 mEq/L (ref 135–145)

## 2012-04-19 LAB — POCT I-STAT TROPONIN I

## 2012-04-19 MED ORDER — IBUPROFEN 800 MG PO TABS: 800.0000 mg | ORAL_TABLET | Freq: Three times a day (TID) | ORAL | Status: DC

## 2012-04-19 MED ORDER — TECHNETIUM TO 99M ALBUMIN AGGREGATED
3.0000 | Freq: Once | INTRAVENOUS | Status: AC | PRN
  Administered 2012-04-19: 3 via INTRAVENOUS

## 2012-04-19 NOTE — ED Notes (Signed)
The pt returned from xray 

## 2012-04-19 NOTE — ED Notes (Signed)
No pain  Resting comfortably.  C/o being hungry

## 2012-04-19 NOTE — ED Notes (Signed)
Pt reports for the last 5 days she has had sharp left sided chest pain. Seen here twice and d/c'd home with cardiology f/u. Seen by cardiology today in office and told to come in d-dimer and further eval.

## 2012-04-19 NOTE — ED Notes (Signed)
nuc med tech was called with weight and  Will be here in approx 45 minutes

## 2012-04-19 NOTE — ED Notes (Signed)
The pt returned from xray alert no distress.  Waiting for the report of the vq

## 2012-04-19 NOTE — ED Notes (Signed)
Iv placed lt hand saline lok.  Waiting on the vq tech to arrive

## 2012-04-19 NOTE — ED Notes (Signed)
The pt was sent here from cards office today because she was told that had an elevation of a blood level that may indicate a clot in her lungs.

## 2012-04-19 NOTE — ED Notes (Signed)
Pt  Taken to nuc med for scan

## 2012-04-19 NOTE — ED Provider Notes (Signed)
History     CSN: [id number]  Arrival date & time 04/19/12  1433   First MD Initiated Contact with Patient 04/19/12 1511      Chief Complaint  Patient presents with  . Chest Pain     HPI chief complaint: Chest pain. Onset: December 25. Location: Chest. Symptoms resolved spontaneously and or worsened with exertion when present. Quality: sharp. Severity: Moderate. Timing: Intermittent. Duration: Up to several hours each episode. No associated diaphoresis, dizziness, syncope, vomiting, diarrhea, bloody stools, leg pain, leg swelling, hemoptysis. Patient does complain of shortness of breath. Regarding social history see nurse's notes. Positive family history for myocardial infarction and congestive heart failure. I reviewed patient's past medical, past surgical, social history as well as medications and allergies.  Past Medical History  Diagnosis Date  . Asthma     Past Surgical History  Procedure Date  . Tubal ligation     History reviewed. No pertinent family history.  History  Substance Use Topics  . Smoking status: Never Smoker   . Smokeless tobacco: Not on file  . Alcohol Use: No    OB History    Grav Para Term Preterm Abortions TAB SAB Ect Mult Living                  Review of Systems 10 Systems reviewed and are negative for acute change except as noted in the HPI.  Allergies  Strawberry and Sulfa antibiotics  Home Medications   Current Outpatient Rx  Name  Route  Sig  Dispense  Refill  . ALBUTEROL SULFATE HFA 108 (90 BASE) MCG/ACT IN AERS   Inhalation   Inhale 2 puffs into the lungs every 6 (six) hours as needed. For shortness of breath         . ASPIRIN EC 325 MG PO TBEC   Oral   Take 325 mg by mouth 2 (two) times daily as needed. For pain         . BUDESONIDE-FORMOTEROL FUMARATE 160-4.5 MCG/ACT IN AERO   Inhalation   Inhale 2 puffs into the lungs every 4 (four) hours as needed. For shortness of breath         . POTASSIUM CHLORIDE CRYS ER 10 MEQ  PO TBCR   Oral   Take 1 tablet (10 mEq total) by mouth 2 (two) times daily.   10 tablet   0     BP 126/85  Pulse 88  Temp 98.2 F (36.8 C) (Oral)  Resp 18  SpO2 98%  LMP 03/01/2012  Physical Exam  Constitutional: She is oriented to person, place, and time. She appears well-developed and well-nourished. No distress.  HENT:  Head: Normocephalic and atraumatic.  Eyes: Conjunctivae normal are normal. Right eye exhibits no discharge. Left eye exhibits no discharge. No scleral icterus.  Neck: Normal range of motion. Neck supple. No JVD present.  Cardiovascular: Normal rate, regular rhythm, normal heart sounds and intact distal pulses.   No murmur heard. Pulmonary/Chest: Effort normal and breath sounds normal. No stridor. No respiratory distress. She has no wheezes. She has no rales. She exhibits no tenderness.  Abdominal: Soft. Bowel sounds are normal. She exhibits no distension and no mass. There is no tenderness. There is no rebound and no guarding.  Musculoskeletal: Normal range of motion. She exhibits no tenderness.  Neurological: She is alert and oriented to person, place, and time.  Skin: Skin is warm and dry. She is not diaphoretic.  Psychiatric: She has a normal mood and affect.  ED Course  Procedures (including critical care time)  Labs Reviewed  CBC - Abnormal; Notable for the following:    WBC 2.8 (*)     RBC 3.66 (*)     Hemoglobin 9.7 (*)     HCT 29.8 (*)     All other components within normal limits  PRO B NATRIURETIC PEPTIDE - Abnormal; Notable for the following:    Pro B Natriuretic peptide (BNP) 366.3 (*)     All other components within normal limits  BASIC METABOLIC PANEL  POCT I-STAT TROPONIN I   Dg Chest 2 View  04/19/2012  *RADIOLOGY REPORT*  Clinical Data: Chest pain  CHEST - 2 VIEW  Comparison: 04/12/2012  Findings: Cardiomediastinal silhouette is stable.  There is trace left pleural effusion with left basilar atelectasis or infiltrate. No pulmonary  edema.  Bony thorax is stable.  IMPRESSION: Trace left pleural effusion with left basilar atelectasis or infiltrate.  No pulmonary edema.   Original Report Authenticated By: [name] Mead, M.D.    Nm Pulmonary Perfusion  04/19/2012  *RADIOLOGY REPORT*  Clinical Data:  Shortness of breath.  Cough.  Chest pain.  Elevated D-dimer.  NUCLEAR MEDICINE PERFUSION LUNG SCAN  Technique:  Perfusion images were obtained in multiple projections after intravenous injection of radiopharmaceutical.  Radiopharmaceutical:  3 mCi Tc-31m MAA.  Comparison:  CTA chest 04/12/2012.  Two-view chest x-ray earlier same date.  Findings: Homogeneous perfusion throughout both lungs without evidence of segmental or subsegmental defects to suggest pulmonary embolism.  IMPRESSION: Normal pulmonary perfusion study.   Original Report Authenticated By: Hulan Saas, M.D.      1. Chest pain   2. Shortness of breath      EKG reviewed and interpreted. Normal sinus rhythm rate 83. Normal axis. Normal intervals. T wave inversions in aVL and lead V2. No ST segment changes. Normal QT interval. MDM  Patient is a well-appearing 47 year old female with no cardiac risk factors other than family history of myocardial infarction at age 9 presents at the request of her cardiologist. Patient been seen twice for chest pain and shortness of breath since her symptoms started around Christmas. She had elevated d-dimer on her first visit which resulted in a negative CT scan PE study. Patient also seen at ED where she had several negative troponins. Patient was seen by Dr. Carolanne Grumbling with [facility] cardiology this morning who stated at that time in the office patient was short of breath and had a d-dimer in the clinic of 15. No reported abnormalities from the bedside echo performed in the cardiologist office today other than a small pericardial effusion. On arrival patient is completely asymptomatic. Vital stable. Afebrile. No concerns and findings. VQ scan  ordered at the request of Dr. Mayford Knife.  VQ scan negative. On further discussion with Dr. Mayford Knife the decision was reached to have the patient discharged home on NSAIDs and cardiology followup next week as well as oncology followup given the leukopenia. Strict return precautions discussed with patient. Understanding verbalized.        Consuello Masse, MD 04/20/12 [phone]

## 2012-04-19 NOTE — ED Provider Notes (Signed)
47 year old female has been having chest pain for the last 10 days. There is associated dyspnea and dyspnea is clearly worse when she lays flat. She's had a cough which has been intermittently productive. She's been seen in the ED twice and had a CT angiogram to look for pulmonary embolus which was unremarkable. She actually feels that she is starting to get better. He had been constant up until today and today pain is intermittent. Pain that starts in her left upper abdomen and radiates up to her left upper chest. She was seen by a cardiologist today and is reported to have abnormal lab test and an abnormal ultrasound and was told to come to the ED immediately. On exam, lungs are clear and heart has regular rate and rhythm. There are no muffled heart tones and no rub or murmur heard. She has no neck vein distention and no peripheral edema.   Date: 04/19/2012  Rate: 83  Rhythm: normal sinus rhythm  QRS Axis: normal  Intervals: normal  ST/T Wave abnormalities: normal  Conduction Disutrbances:none  Narrative Interpretation: Probable anteroseptal myocardial infarction. When compared with ECG of 04/16/2012, no significant changes are seen.  Old EKG Reviewed: unchanged  I saw and evaluated the patient, reviewed the resident's note and I agree with the findings and plan.   Dione Booze, MD 04/19/12 [phone]

## 2012-04-24 ENCOUNTER — Other Ambulatory Visit: Payer: Self-pay | Admitting: Family Medicine

## 2012-04-24 DIAGNOSIS — R590 Localized enlarged lymph nodes: Secondary | ICD-10-CM

## 2012-05-02 ENCOUNTER — Other Ambulatory Visit: Payer: Self-pay | Admitting: Oncology

## 2012-05-02 ENCOUNTER — Encounter: Payer: Self-pay | Admitting: *Deleted

## 2012-05-02 ENCOUNTER — Other Ambulatory Visit: Payer: BC Managed Care – PPO

## 2012-05-02 ENCOUNTER — Telehealth: Payer: Self-pay | Admitting: *Deleted

## 2012-05-02 DIAGNOSIS — D649 Anemia, unspecified: Secondary | ICD-10-CM

## 2012-05-02 NOTE — Telephone Encounter (Signed)
Patient states she has been seen in the e.r. 3 times with SOB, CP. Labs are low and the cardiologist would like for dr Clelia Croft to see patient. Per dr Clelia Croft, he has reviewed her labs and they are unchanged from her last visit here and there is no urgency. Will have schedulers call her for an appt with in a couple of weeks. Patient verbalizes understanding.

## 2012-05-03 ENCOUNTER — Telehealth: Payer: Self-pay | Admitting: Oncology

## 2012-05-03 NOTE — Telephone Encounter (Signed)
s.w pt and advised on 2.18.14 appt...pt ok

## 2012-05-24 ENCOUNTER — Institutional Professional Consult (permissible substitution): Payer: BC Managed Care – PPO | Admitting: Internal Medicine

## 2012-05-31 ENCOUNTER — Telehealth: Payer: Self-pay | Admitting: Oncology

## 2012-05-31 ENCOUNTER — Institutional Professional Consult (permissible substitution): Payer: BC Managed Care – PPO | Admitting: Internal Medicine

## 2012-05-31 NOTE — Telephone Encounter (Signed)
s.w. pt and advised on Dr Clelia Croft d/t appt change for April...pt ok and aware

## 2012-06-04 ENCOUNTER — Ambulatory Visit: Payer: BC Managed Care – PPO | Admitting: Oncology

## 2012-06-04 ENCOUNTER — Other Ambulatory Visit: Payer: BC Managed Care – PPO | Admitting: Lab

## 2012-06-13 ENCOUNTER — Ambulatory Visit
Admission: RE | Admit: 2012-06-13 | Discharge: 2012-06-13 | Disposition: A | Discharge status: Home / Self Care | Payer: BC Managed Care – PPO | Source: Ambulatory Visit | Attending: Family Medicine | Admitting: Family Medicine

## 2012-06-13 DIAGNOSIS — R590 Localized enlarged lymph nodes: Secondary | ICD-10-CM

## 2012-06-14 ENCOUNTER — Other Ambulatory Visit (HOSPITAL_COMMUNITY)
Admission: RE | Admit: 2012-06-14 | Discharge: 2012-06-14 | Disposition: A | Discharge status: Home / Self Care | Payer: BC Managed Care – PPO | Source: Ambulatory Visit | Attending: Obstetrics and Gynecology | Admitting: Obstetrics and Gynecology

## 2012-06-14 ENCOUNTER — Other Ambulatory Visit: Payer: Self-pay | Admitting: Obstetrics and Gynecology

## 2012-06-14 DIAGNOSIS — Z1151 Encounter for screening for human papillomavirus (HPV): Secondary | ICD-10-CM | POA: Insufficient documentation

## 2012-06-14 DIAGNOSIS — Z01419 Encounter for gynecological examination (general) (routine) without abnormal findings: Secondary | ICD-10-CM | POA: Insufficient documentation

## 2012-07-16 ENCOUNTER — Other Ambulatory Visit: Payer: BC Managed Care – PPO | Admitting: Lab

## 2012-07-16 ENCOUNTER — Ambulatory Visit: Payer: BC Managed Care – PPO | Admitting: Oncology

## 2012-07-17 ENCOUNTER — Telehealth: Payer: Self-pay | Admitting: Oncology

## 2012-07-17 ENCOUNTER — Ambulatory Visit: Payer: BC Managed Care – PPO | Admitting: Oncology

## 2012-07-17 ENCOUNTER — Other Ambulatory Visit: Payer: BC Managed Care – PPO | Admitting: Lab

## 2012-08-06 ENCOUNTER — Telehealth: Payer: Self-pay | Admitting: Oncology

## 2012-09-05 ENCOUNTER — Other Ambulatory Visit: Payer: BC Managed Care – PPO | Admitting: Lab

## 2012-09-05 ENCOUNTER — Ambulatory Visit: Payer: BC Managed Care – PPO | Admitting: Oncology

## 2012-09-06 ENCOUNTER — Ambulatory Visit: Payer: BC Managed Care – PPO | Admitting: Oncology

## 2012-09-06 ENCOUNTER — Other Ambulatory Visit: Payer: BC Managed Care – PPO | Admitting: Lab

## 2012-10-24 ENCOUNTER — Ambulatory Visit: Payer: BC Managed Care – PPO | Admitting: Sports Medicine

## 2012-11-27 ENCOUNTER — Ambulatory Visit: Payer: BC Managed Care – PPO | Admitting: Sports Medicine

## 2012-12-24 ENCOUNTER — Telehealth: Payer: Self-pay | Admitting: Oncology

## 2012-12-24 ENCOUNTER — Other Ambulatory Visit: Payer: Self-pay | Admitting: Oncology

## 2012-12-24 DIAGNOSIS — D61818 Other pancytopenia: Secondary | ICD-10-CM

## 2012-12-24 NOTE — Telephone Encounter (Signed)
REFERRAL FORWARDED TO DESK NURSE PT IS ESTABLISHED W/DR. SHADAD

## 2013-03-22 ENCOUNTER — Emergency Department: Payer: Self-pay | Admitting: Emergency Medicine

## 2013-03-23 LAB — CBC
HCT: 32.9 % — ABNORMAL LOW (ref 35.0–47.0)
MCH: 27.5 pg (ref 26.0–34.0)
MCHC: 33.1 g/dL (ref 32.0–36.0)
MCV: 83 fL (ref 80–100)

## 2013-03-23 LAB — BASIC METABOLIC PANEL
Anion Gap: 6 — ABNORMAL LOW (ref 7–16)
BUN: 13 mg/dL (ref 7–18)
Calcium, Total: 8.9 mg/dL (ref 8.5–10.1)
Chloride: 104 mmol/L (ref 98–107)
Creatinine: 0.91 mg/dL (ref 0.60–1.30)
EGFR (African American): >60
Osmolality: 276 (ref 275–301)
Potassium: 3.4 mmol/L — ABNORMAL LOW (ref 3.5–5.1)

## 2013-06-13 ENCOUNTER — Ambulatory Visit: Payer: Self-pay | Admitting: Podiatry

## 2013-06-20 ENCOUNTER — Ambulatory Visit (INDEPENDENT_AMBULATORY_CARE_PROVIDER_SITE_OTHER): Payer: BC Managed Care – PPO | Admitting: Podiatry

## 2013-06-20 ENCOUNTER — Ambulatory Visit (INDEPENDENT_AMBULATORY_CARE_PROVIDER_SITE_OTHER): Payer: BC Managed Care – PPO

## 2013-06-20 ENCOUNTER — Encounter: Payer: Self-pay | Admitting: Podiatry

## 2013-06-20 VITALS — BP 136/94 | HR 85 | Resp 16 | Ht 69.0 in | Wt 148.0 lb

## 2013-06-20 DIAGNOSIS — M79673 Pain in unspecified foot: Secondary | ICD-10-CM

## 2013-06-20 DIAGNOSIS — M79609 Pain in unspecified limb: Secondary | ICD-10-CM

## 2013-06-20 DIAGNOSIS — M2040 Other hammer toe(s) (acquired), unspecified foot: Secondary | ICD-10-CM

## 2013-06-20 DIAGNOSIS — M2010 Hallux valgus (acquired), unspecified foot: Secondary | ICD-10-CM

## 2013-06-20 NOTE — Progress Notes (Signed)
Subjective:     Patient ID: Megan Reid, female   DOB: Aug 12, 1965, 48 y.o.   MRN: [id number]  Foot Pain   patient presents stating I have a very painful bunion on my left foot and my fourth and fifth toes get tender. Patient states that it has been getting worse for the last year or 2 she's tried wider shoes and she's tried soaks without relief along with oral anti-inflammatory agents   Review of Systems  All other systems reviewed and are negative.       Objective:   Physical Exam  Nursing note and vitals reviewed. Constitutional: She is oriented to person, place, and time.  Cardiovascular: Intact distal pulses.   Musculoskeletal: Normal range of motion.  Neurological: She is oriented to person, place, and time.  Skin: Skin is warm.   neurovascular status found to be intact with muscle strength adequate and no equinus and good range of motion of the subtalar and midtarsal joint. Patient is found to have hyperostosis medial aspect first metatarsal head left over right it's very painful on the left with the deviation of the hallux against the second toe and reduced range of motion of the first MPJ but no crepitus or indications of arthritis. 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 Discussed consideration for correction consisting of Austin osteotomy with pin and arthroplasty of fourth and fifth toes left. Educated patient on deformity patient wants to have this fixed and we will try to find a time to do this in the next month. Reappoint one week for consultation concerning correction

## 2013-06-20 NOTE — Progress Notes (Signed)
   Subjective:    Patient ID: Lucia Gaskins, female    DOB: 09-Feb-1966, 48 y.o.   MRN: [id number]  HPI Comments: N pain left only L b/l bunions D 20 + yrs O slowly C worse A shoes T seen podiatrist in [place] 3 yrs ago, told her not to do anything with them as of yet   Foot Pain Associated symptoms include chills, coughing and headaches.      Review of Systems  Constitutional: Positive for chills.  HENT:       Sinus problems  Eyes: Positive for visual disturbance.  Respiratory: Positive for cough.        Difficulty breathing  Endocrine: Positive for cold intolerance.  Allergic/Immunologic: Positive for environmental allergies.  Neurological: Positive for headaches.  Hematological:       Slow to heal  All other systems reviewed and are negative.       Objective:   Physical Exam        Assessment & Plan:

## 2013-06-27 ENCOUNTER — Ambulatory Visit: Payer: BC Managed Care – PPO | Admitting: Podiatry

## 2013-07-21 ENCOUNTER — Other Ambulatory Visit: Payer: Self-pay

## 2013-07-21 DIAGNOSIS — Z1231 Encounter for screening mammogram for malignant neoplasm of breast: Secondary | ICD-10-CM

## 2013-08-01 ENCOUNTER — Ambulatory Visit: Payer: BC Managed Care – PPO

## 2013-08-05 ENCOUNTER — Inpatient Hospital Stay: Admission: RE | Admit: 2013-08-05 | Payer: BC Managed Care – PPO | Source: Ambulatory Visit

## 2013-08-06 ENCOUNTER — Ambulatory Visit
Admission: RE | Admit: 2013-08-06 | Discharge: 2013-08-06 | Disposition: A | Discharge status: Home / Self Care | Payer: BC Managed Care – PPO | Source: Ambulatory Visit

## 2013-08-06 DIAGNOSIS — Z1231 Encounter for screening mammogram for malignant neoplasm of breast: Secondary | ICD-10-CM

## 2013-08-14 ENCOUNTER — Ambulatory Visit: Payer: Self-pay | Admitting: Orthopedic Surgery

## 2014-01-30 ENCOUNTER — Telehealth: Payer: Self-pay | Admitting: Oncology

## 2014-01-30 ENCOUNTER — Telehealth: Payer: Self-pay | Admitting: *Deleted

## 2014-01-30 NOTE — Telephone Encounter (Signed)
PT. IS VERY TIRED. SHE IS HAVING NIGHTS SWEATS AND SWELLING IN HER JOINTS. PT. PHONE #[phone]. THIS NOTE TO DR.SHADAD'S ACTIVE WORK FOLDER.

## 2014-01-30 NOTE — Telephone Encounter (Signed)
VERBAL ORDER AND READ BACK TO DR.SHADAD- SCHEDULE PT. FOR NEXT AVAILABLE THIRTY MINUTE VISIT WITH LAB. THIS INFORMATION WAS GIVEN TO SCHEDULER, KIM.

## 2014-01-30 NOTE — Telephone Encounter (Signed)
Pt called to sch MD visit hasn't been seen since 2013, per MD/Triage pt to be seen next available w/labs. Called pt back pt confirmed labs/ov for 10/20 pt states she is going to take a half a sick day from work so that she will be able to come in and corrected cell phone area code..... KJ

## 2014-02-02 ENCOUNTER — Encounter: Payer: Self-pay | Admitting: *Deleted

## 2014-02-03 ENCOUNTER — Telehealth: Payer: Self-pay | Admitting: Oncology

## 2014-02-03 ENCOUNTER — Other Ambulatory Visit: Payer: Self-pay | Admitting: Oncology

## 2014-02-03 ENCOUNTER — Other Ambulatory Visit (HOSPITAL_BASED_OUTPATIENT_CLINIC_OR_DEPARTMENT_OTHER): Payer: BC Managed Care – PPO

## 2014-02-03 ENCOUNTER — Encounter: Payer: Self-pay | Admitting: Oncology

## 2014-02-03 ENCOUNTER — Ambulatory Visit (HOSPITAL_BASED_OUTPATIENT_CLINIC_OR_DEPARTMENT_OTHER): Payer: BC Managed Care – PPO | Admitting: Oncology

## 2014-02-03 VITALS — BP 149/95 | HR 109 | Temp 97.3°F | Resp 20 | Wt 168.0 lb

## 2014-02-03 DIAGNOSIS — R591 Generalized enlarged lymph nodes: Secondary | ICD-10-CM

## 2014-02-03 DIAGNOSIS — D61818 Other pancytopenia: Secondary | ICD-10-CM

## 2014-02-03 DIAGNOSIS — D72821 Monocytosis (symptomatic): Secondary | ICD-10-CM

## 2014-02-03 DIAGNOSIS — D649 Anemia, unspecified: Secondary | ICD-10-CM

## 2014-02-03 LAB — COMPREHENSIVE METABOLIC PANEL (CC13)
ALT: 30 U/L (ref 0–55)
ANION GAP: 8 meq/L (ref 3–11)
AST: 35 U/L — ABNORMAL HIGH (ref 5–34)
Albumin: 3.2 g/dL — ABNORMAL LOW (ref 3.5–5.0)
Alkaline Phosphatase: 71 U/L (ref 40–150)
BUN: 16.3 mg/dL (ref 7.0–26.0)
CALCIUM: 10 mg/dL (ref 8.4–10.4)
CHLORIDE: 107 meq/L (ref 98–109)
CO2: 26 meq/L (ref 22–29)
Creatinine: 0.9 mg/dL (ref 0.6–1.1)
Glucose: 72 mg/dl (ref 70–140)
Potassium: 3.9 mEq/L (ref 3.5–5.1)
SODIUM: 140 meq/L (ref 136–145)
TOTAL PROTEIN: 9.7 g/dL — AB (ref 6.4–8.3)
Total Bilirubin: 0.35 mg/dL (ref 0.20–1.20)

## 2014-02-03 LAB — CBC WITH DIFFERENTIAL/PLATELET
BASO%: 0.7 % (ref 0.0–2.0)
Basophils Absolute: 0 10*3/uL (ref 0.0–0.1)
EOS%: 0.3 % (ref 0.0–7.0)
Eosinophils Absolute: 0 10*3/uL (ref 0.0–0.5)
HEMATOCRIT: 37.5 % (ref 34.8–46.6)
HGB: 12 g/dL (ref 11.6–15.9)
LYMPH#: 0.7 10*3/uL — AB (ref 0.9–3.3)
LYMPH%: 25.9 % (ref 14.0–49.7)
MCH: 26.9 pg (ref 25.1–34.0)
MCHC: 31.9 g/dL (ref 31.5–36.0)
MCV: 84.6 fL (ref 79.5–101.0)
MONO#: 0.3 10*3/uL (ref 0.1–0.9)
MONO%: 9.2 % (ref 0.0–14.0)
NEUT%: 63.9 % (ref 38.4–76.8)
NEUTROS ABS: 1.8 10*3/uL (ref 1.5–6.5)
Platelets: 137 10*3/uL — ABNORMAL LOW (ref 145–400)
RBC: 4.44 10*6/uL (ref 3.70–5.45)
RDW: 13.6 % (ref 11.2–14.5)
WBC: 2.8 10*3/uL — AB (ref 3.9–10.3)

## 2014-02-03 LAB — CHCC SMEAR

## 2014-02-03 NOTE — Progress Notes (Signed)
Hematology and Oncology Follow Up Visit  Megan Reid [id number] 19-Oct-1965 48 y.o. 02/03/2014 9:54 AM   Principle Diagnosis: 48 year old woman with leukocytopenia, monocytosis and mild lymph node enlargement. She has been followed since 2013 with recurrent symptoms.   Interim History:  Ms Megan Reid presents today for a follow up visit. I have not seen her in the last 2 years but called our office complaining of symptoms of recurrent fevers, chills, sweats and lymphadenopathy. She also reported symptoms of bone pain associated with her lower and upper extremities. She reporting the pain as rather nagging persistent with crawling qualities. She feels that she has insects calling under her leg. She also reported symptoms of wheezing and shortness of breath at times. Although her appetite has not really changed and her weight have been stable. She did not report any headaches or blurry vision or syncope. She does report some occasional lightheadedness and dizziness. She does not report any chest pain or palpitation. She does not report any cough. She is not reporting any nausea, vomiting, abdominal pain, hematochezia, melena. She did not report any frequency urgency or hesitancy. She does not report any petechiae or easy bruising. Rest of her review of systems unremarkable.  Medications: I have reviewed the patient's current medications.   Current Outpatient Prescriptions  Medication Sig Dispense Refill  . levalbuterol (XOPENEX HFA) 45 MCG/ACT inhaler Inhale 2 puffs into the lungs every 6 (six) hours as needed for wheezing.      Marland Kitchen albuterol (PROVENTIL HFA;VENTOLIN HFA) 108 (90 BASE) MCG/ACT inhaler Inhale 2 puffs into the lungs every 6 (six) hours as needed. For shortness of breath      . montelukast (SINGULAIR) 10 MG tablet        No current facility-administered medications for this visit.    Allergies:  Allergies  Allergen Reactions  . Strawberry     hives  . Sulfa Antibiotics    swelling    Past Medical History, Surgical history, Social history, and Family History were reviewed and updated.   Physical Exam: Blood pressure 149/95, pulse 109, temperature 97.3 F (36.3 C), temperature source Oral, resp. rate 20, weight 168 lb (76.204 kg). ECOG: 1 General appearance: alert awake epilated in mild distress. Head: Normocephalic, without obvious abnormality Neck: No thyromegaly or masses. Lymph nodes: Small lymphadenopathy noted in the cervical area. Heart:regular rate and rhythm, S1, S2 normal, no murmur, click, rub or gallop Lung:chest clear, expiratory wheezes noted. Abdomin: soft, non-tender, without masses or organomegaly EXT:no erythema, induration, or nodules   Lab Results: Lab Results  Component Value Date   WBC 2.8* 02/03/2014   HGB 12.0 02/03/2014   HCT 37.5 02/03/2014   MCV 84.6 02/03/2014   PLT 137* 02/03/2014     Chemistry      Component Value Date/Time   NA 138 04/19/2012 1519   NA 137 01/11/2012 1349   K 3.9 04/19/2012 1519   K 4.3 01/11/2012 1349   CL 102 04/19/2012 1519   CL 105 01/11/2012 1349   CO2 29 04/19/2012 1519   CO2 26 01/11/2012 1349   BUN 11 04/19/2012 1519   BUN 17.0 01/11/2012 1349   CREATININE 0.64 04/19/2012 1519   CREATININE 1.1 01/11/2012 1349      Component Value Date/Time   CALCIUM 9.8 04/19/2012 1519   CALCIUM 9.8 01/11/2012 1349   ALKPHOS 66 01/11/2012 1349   AST 49* 01/11/2012 1349   ALT 43 01/11/2012 1349   BILITOT 0.20 01/11/2012 1349  Impression and Plan: A 48 year old woman with the following issues:  1. 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 The differential diagnosis was discussed today extensively with the patient. I cannot rule out a lymphoproliferative disorder at this time given her constitutional symptoms and leukopenia. Conditions such as leukemia, lymphoma, myelodysplasia among others. Nonmalignant etiologies would include viral infection and autoimmune disorder. The rectus up  completely, I would like to repeat a PET/CT scan to evaluate her for any diffuse lymphadenopathy. I will like to also repeat a bone marrow biopsy on her. She had one presumably about 5 years ago in [place]. Risks and benefits of this procedure was discussed. Complications include bleeding, infection and pain. She prefers to have this done under CT guidance and we'll arrange for interventional radiology for it to be done soon.  2. Anemia: Her hemoglobin is normal at this time and no evidence of bleeding.  3. Followup: Will be after the completion of her workup to discuss the results.    TMBPJP,ETKKO, MD 10/20/20159:54 AM

## 2014-02-03 NOTE — Telephone Encounter (Signed)
Pt confirmed ov per 10/20 POF, Pt adv of PET/CT Biopsy, gave pt AVS.... KJ

## 2014-02-06 ENCOUNTER — Other Ambulatory Visit: Payer: Self-pay | Admitting: Radiology

## 2014-02-09 ENCOUNTER — Ambulatory Visit (HOSPITAL_COMMUNITY): Payer: BC Managed Care – PPO

## 2014-02-10 ENCOUNTER — Ambulatory Visit (HOSPITAL_COMMUNITY)
Admission: RE | Admit: 2014-02-10 | Discharge: 2014-02-10 | Disposition: A | Discharge status: Home / Self Care | Payer: BC Managed Care – PPO | Source: Ambulatory Visit | Attending: Oncology | Admitting: Oncology

## 2014-02-10 ENCOUNTER — Other Ambulatory Visit: Payer: Self-pay | Admitting: Radiology

## 2014-02-10 DIAGNOSIS — R591 Generalized enlarged lymph nodes: Secondary | ICD-10-CM | POA: Diagnosis not present

## 2014-02-10 DIAGNOSIS — D61818 Other pancytopenia: Secondary | ICD-10-CM | POA: Diagnosis not present

## 2014-02-10 LAB — GLUCOSE, CAPILLARY: Glucose-Capillary: 82 mg/dL (ref 70–99)

## 2014-02-10 MED ORDER — FLUDEOXYGLUCOSE F - 18 (FDG) INJECTION
8.3000 | Freq: Once | INTRAVENOUS | Status: AC | PRN
  Administered 2014-02-10: 8.3 via INTRAVENOUS

## 2014-02-12 ENCOUNTER — Ambulatory Visit (HOSPITAL_COMMUNITY)
Admission: RE | Admit: 2014-02-12 | Discharge: 2014-02-12 | Disposition: A | Discharge status: Home / Self Care | Payer: BC Managed Care – PPO | Source: Ambulatory Visit | Attending: Oncology | Admitting: Oncology

## 2014-02-12 ENCOUNTER — Encounter (HOSPITAL_COMMUNITY): Payer: Self-pay

## 2014-02-12 DIAGNOSIS — R599 Enlarged lymph nodes, unspecified: Secondary | ICD-10-CM | POA: Diagnosis present

## 2014-02-12 DIAGNOSIS — D61818 Other pancytopenia: Secondary | ICD-10-CM | POA: Diagnosis present

## 2014-02-12 DIAGNOSIS — Z87891 Personal history of nicotine dependence: Secondary | ICD-10-CM | POA: Diagnosis not present

## 2014-02-12 DIAGNOSIS — R591 Generalized enlarged lymph nodes: Secondary | ICD-10-CM

## 2014-02-12 DIAGNOSIS — D72819 Decreased white blood cell count, unspecified: Secondary | ICD-10-CM | POA: Diagnosis not present

## 2014-02-12 LAB — CBC WITH DIFFERENTIAL/PLATELET
BASOS ABS: 0 10*3/uL (ref 0.0–0.1)
BASOS PCT: 1 % (ref 0–1)
EOS PCT: 1 % (ref 0–5)
Eosinophils Absolute: 0 10*3/uL (ref 0.0–0.7)
HEMATOCRIT: 34.4 % — AB (ref 36.0–46.0)
Hemoglobin: 11.4 g/dL — ABNORMAL LOW (ref 12.0–15.0)
Lymphocytes Relative: 34 % (ref 12–46)
Lymphs Abs: 0.9 10*3/uL (ref 0.7–4.0)
MCH: 27.8 pg (ref 26.0–34.0)
MCHC: 33.1 g/dL (ref 30.0–36.0)
MCV: 83.9 fL (ref 78.0–100.0)
MONO ABS: 0.3 10*3/uL (ref 0.1–1.0)
Monocytes Relative: 11 % (ref 3–12)
Neutro Abs: 1.4 10*3/uL — ABNORMAL LOW (ref 1.7–7.7)
Neutrophils Relative %: 53 % (ref 43–77)
Platelets: 127 10*3/uL — ABNORMAL LOW (ref 150–400)
RBC: 4.1 MIL/uL (ref 3.87–5.11)
RDW: 13.2 % (ref 11.5–15.5)
WBC: 2.5 10*3/uL — ABNORMAL LOW (ref 4.0–10.5)

## 2014-02-12 LAB — APTT: aPTT: 24 seconds (ref 24–37)

## 2014-02-12 LAB — PROTIME-INR
INR: 1.03 (ref 0.00–1.49)
Prothrombin Time: 13.6 seconds (ref 11.6–15.2)

## 2014-02-12 LAB — BONE MARROW EXAM

## 2014-02-12 MED ORDER — FENTANYL CITRATE 0.05 MG/ML IJ SOLN
INTRAMUSCULAR | Status: AC | PRN
  Administered 2014-02-12: 100 ug via INTRAVENOUS

## 2014-02-12 MED ORDER — SODIUM CHLORIDE 0.9 % IV SOLN
INTRAVENOUS | Status: DC
  Administered 2014-02-12: 500 mL via INTRAVENOUS

## 2014-02-12 MED ORDER — FENTANYL CITRATE 0.05 MG/ML IJ SOLN
INTRAMUSCULAR | Status: AC
  Filled 2014-02-12: qty 4

## 2014-02-12 MED ORDER — MIDAZOLAM HCL 2 MG/2ML IJ SOLN
INTRAMUSCULAR | Status: AC | PRN
  Administered 2014-02-12: 2 mg via INTRAVENOUS

## 2014-02-12 MED ORDER — HYDROCODONE-ACETAMINOPHEN 5-325 MG PO TABS
1.0000 | ORAL_TABLET | ORAL | Status: DC | PRN
  Filled 2014-02-12: qty 2

## 2014-02-12 MED ORDER — MIDAZOLAM HCL 2 MG/2ML IJ SOLN
INTRAMUSCULAR | Status: AC
  Filled 2014-02-12: qty 4

## 2014-02-12 NOTE — Discharge Instructions (Signed)
Bone Biopsy, Open °An open bone biopsy is a surgical procedure to remove a small piece of bone. This piece of bone is examined under a microscope by a specialist (pathologist). It is usually taken in the area where X-rays and or MRI have identified a concern. The biopsy may be done to make sure something seen in the bone is not cancer or another problem that needs treatment. It can identify problems such as: °· A tumor of the bone marrow (multiple myeloma). °· Bone that forms abnormally (Paget's disease). °· Benign bone cysts. °· Bony growths. °· An infection in the bone (osteomyelitis). °LET YOUR CAREGIVER KNOW ABOUT:  °· Previous problems with anesthetics or medicines used to numb the skin. °· Allergies to dyes, iodine, foods, or latex. °· Medicines taken including herbs, eye drops, prescription medicines (especially medicines used to "thin the blood"), aspirin and other over-the-counter medicines, and steroids (by mouth or as a cream). °· History of bleeding or blood problems. °· Possibility of pregnancy, if this applies. °· History of blood clots in your legs or lungs . °· Previous surgery. °· Other important health problems. °RISKS AND COMPLICATIONS °All surgery is associated with risks. Some of these risks are:  °· Excessive bleeding. °· Infection. °· Injury to surrounding tissue. °BEFORE THE PROCEDURE °· Ask your caregiver how long to withhold aspirin or blood thinners prior to the procedure, as these can cause excessive bleeding after surgery. °· Do not eat or drink anything after midnight the night before the biopsy °· Let your caregiver know if you develop a cold or other infectious problem prior to surgery. °· You should be present 60 minutes prior to your procedure or as directed. °PROCEDURE  °· A general anesthetic is usually given. This means you will be asleep during the procedure. Sometimes a regional anesthesia is used. This means just the location of the surgical site will be numbed for the  procedure. °· After the sample is removed through a cut made by the surgeon, the cut is sewn up with stitches. °· This is usually a same day surgery, although your caregiver may want you to stay overnight for observation. If you are allowed to go home, you can usually leave within a couple of hours after surgery as long as there are no complications. °HOME CARE INSTRUCTIONS  °· Keep your surgery site clean and dry. °· You may shower and bathe normally unless instructed otherwise by your surgeon. °· Pat the wound dry and put on a new dressing (medication and bandage) after cleansing. °· Protect the wound until your caregiver advises you can return to regular daily activities. °Finding out the results of your test °Not all test results are available during your visit. If your test results are not back during the visit, make an appointment with your caregiver to find out the results. Do not assume everything is normal if you have not heard from your caregiver or the medical facility. It is important for you to follow up on all of your test results.  °SEEK MEDICAL CARE IF:  °· You have redness, swelling, or increasing pain in the surgical site. °· You have pus coming from the surgical site. °· You have drainage from the biopsy site lasting longer than 1 day. °· You notice a bad smell coming from the biopsy site or dressing. °· You have a breaking open of the site (edges not staying together) after sutures have been removed. °· You develop persistent nausea or vomiting. °SEEK IMMEDIATE MEDICAL   CARE IF:   You have a fever.  You develop a rash.  You have difficulty breathing.  You develop any reaction or side effects to medicines given. Document Released: 02/11/2004 Document Revised: 06/26/2011 Document Reviewed: 09/09/2008 [facility] Patient Information 2015 [place], [place]. This information is not intended to replace advice given to you by your health care provider. Make sure you discuss any questions you have  with your health care provider. Do not drive  For 24 hours Do not go into public places today May resume your regular diet and take home medications as usual May experience small amount of tingling in leg (biopsy side) May take shower and remove bandage in am For any questions or concerns, call dr If bleeding occurs at site, hold pressure x10 minutes  If continues, call doctorBone Biopsy, Needle, Care After Read the instructions outlined below and refer to this sheet in the next few weeks. These discharge instructions provide you with general information on caring for yourself after you leave the hospital. Your caregiver may also give you specific instructions. While your treatment has been planned according to the most current medical practices available, unavoidable complications sometimes occur. If you have any problems or questions after discharge, call your caregiver. Finding out the results of your test Not all test results are available during your visit. If your test results are not back during the visit, make an appointment with your caregiver to find out the results. Do not assume everything is normal if you have not heard from your caregiver or the medical facility. It is important for you to follow up on all of your test results.  SEEK MEDICAL CARE IF:   You have redness, swelling, or increasing pain at the site of the biopsy.  You have pus coming from the biopsy site.  You have drainage from the biopsy site lasting longer than 1 day.  You notice a bad smell coming from the biopsy site or dressing.  You develop persistent nausea or vomiting. SEEK IMMEDIATE MEDICAL CARE IF:  You have a fever.  You develop a rash.  You have difficulty breathing.  You develop any reaction or side effects to medicines given. Document Released: 10/21/2004 Document Revised: 01/22/2013 Document Reviewed: 09/08/2008 [facility] Patient Information 2015 [place], [place]. This information is not  intended to replace advice given to you by your health care provider. Make sure you discuss any questions you have with your health care provider. Conscious Sedation, Adult, Care After Refer to this sheet in the next few weeks. These instructions provide you with information on caring for yourself after your procedure. Your health care provider may also give you more specific instructions. Your treatment has been planned according to current medical practices, but problems sometimes occur. Call your health care provider if you have any problems or questions after your procedure. WHAT TO EXPECT AFTER THE PROCEDURE  After your procedure:  You may feel sleepy, clumsy, and have poor balance for several hours.  Vomiting may occur if you eat too soon after the procedure. HOME CARE INSTRUCTIONS  Do not participate in any activities where you could become injured for at least 24 hours. Do not:  Drive.  Swim.  Ride a bicycle.  Operate heavy machinery.  Cook.  Use power tools.  Climb ladders.  Work from a high place.  Do not make important decisions or sign legal documents until you are improved.  If you vomit, drink water, juice, or soup when you can drink without vomiting. Make  sure you have little or no nausea before eating solid foods.  Only take over-the-counter or prescription medicines for pain, discomfort, or fever as directed by your health care provider.  Make sure you and your family fully understand everything about the medicines given to you, including what side effects may occur.  You should not drink alcohol, take sleeping pills, or take medicines that cause drowsiness for at least 24 hours.  If you smoke, do not smoke without supervision.  If you are feeling better, you may resume normal activities 24 hours after you were sedated.  Keep all appointments with your health care provider. SEEK MEDICAL CARE IF:  Your skin is pale or bluish in color.  You continue to  feel nauseous or vomit.  Your pain is getting worse and is not helped by medicine.  You have bleeding or swelling.  You are still sleepy or feeling clumsy after 24 hours. SEEK IMMEDIATE MEDICAL CARE IF:  You develop a rash.  You have difficulty breathing.  You develop any type of allergic problem.  You have a fever. MAKE SURE YOU:  Understand these instructions.  Will watch your condition.  Will get help right away if you are not doing well or get worse. Document Released: 01/22/2013 Document Reviewed: 01/22/2013 [facility] Patient Information 2015 [place], [place]. This information is not intended to replace advice given to you by your health care provider. Make sure you discuss any questions you have with your health care provider.

## 2014-02-12 NOTE — H&P (Signed)
Chief Complaint: Pancytopenia Lymphadenopathy  Referring Physician(s): Shadad,Firas N  History of Present Illness: Megan Reid is a 48 y.o. female  Pt with hx leukocytopenia/pancytopenia Has had work up in [facility] 2012 "Negative" per pt Now with symptoms of fatigue; and cervical lymphadenopathy 02/10/14 PET no lymphoproliferative disorder Scheduled for Bone marrow biopsy per Dr Alen Blew   Past Medical History  Diagnosis Date  . Asthma   . Hot flashes     Past Surgical History  Procedure Laterality Date  . Tubal ligation      Allergies: Strawberry and Sulfa antibiotics  Medications: Prior to Admission medications   Medication Sig Start Date End Date Taking? Authorizing Provider  albuterol (PROVENTIL HFA;VENTOLIN HFA) 108 (90 BASE) MCG/ACT inhaler Inhale 2 puffs into the lungs 2 (two) times daily. For shortness of breath   Yes Historical Provider, MD  levalbuterol (XOPENEX HFA) 45 MCG/ACT inhaler Inhale 2 puffs into the lungs every 4 (four) hours as needed for wheezing or shortness of breath.    Yes Historical Provider, MD  aspirin 500 MG tablet Take 500 mg by mouth every 6 (six) hours as needed for pain.    Historical Provider, MD  montelukast (SINGULAIR) 10 MG tablet Take 10 mg by mouth every evening.  05/28/13   Historical Provider, MD    History reviewed. No pertinent family history.  History   Social History  . Marital Status: Legally Separated    Spouse Name: N/A    Number of Children: N/A  . Years of Education: N/A   Social History Main Topics  . Smoking status: Former Research scientist (life sciences)  . Smokeless tobacco: None  . Alcohol Use: No  . Drug Use: No  . Sexual Activity: None   Other Topics Concern  . None   Social History Narrative  . None     Review of Systems: A 12 point ROS discussed and pertinent positives are indicated in the HPI above.  All other systems are negative.  Review of Systems  Constitutional: Positive for fatigue. Negative for  activity change, appetite change and unexpected weight change.  Respiratory: Positive for shortness of breath. Negative for cough.   Cardiovascular: Negative for chest pain.  Gastrointestinal: Negative for nausea, vomiting and abdominal pain.  Genitourinary: Negative for difficulty urinating.  Musculoskeletal: Negative for back pain.  Neurological: Negative for dizziness.  Psychiatric/Behavioral: Negative for behavioral problems, confusion and agitation.    Vital Signs: BP 150/98  Pulse 79  Temp(Src) 97.9 F (36.6 C) (Oral)  Resp 18  Ht 5' 10"  (1.778 m)  Wt 76.204 kg (168 lb)  BMI 24.11 kg/m2  SpO2 100%  LMP 05/22/2012  Physical Exam  Constitutional: She is oriented to person, place, and time. She appears well-nourished.  Cardiovascular: Normal rate, regular rhythm and normal heart sounds.   No murmur heard. Pulmonary/Chest: Effort normal and breath sounds normal. She has no wheezes.  Abdominal: Soft. Bowel sounds are normal. There is no tenderness.  Musculoskeletal: Normal range of motion.  Neurological: She is alert and oriented to person, place, and time.  Skin: Skin is warm and dry.  Psychiatric: She has a normal mood and affect. Her behavior is normal. Judgment and thought content normal.    Imaging: Nm Pet Image Initial (pi) Skull Base To Thigh  02/10/2014   CLINICAL DATA:  Initial treatment strategy for lymphadenopathy. Pancytopenia  EXAM: NUCLEAR MEDICINE PET SKULL BASE TO THIGH  TECHNIQUE: 8.3 mCi F-18 FDG was injected intravenously. Full-ring PET imaging was performed from the skull  base to thigh after the radiotracer. CT data was obtained and used for attenuation correction and anatomic localization.  FASTING BLOOD GLUCOSE:  Value: 82 mg/dl  COMPARISON:  MR lumbar spine 08/14/2013 CT 04/12/2012  FINDINGS: NECK  There is hypermetabolic activity within the parotid glands and submandibular glands which is symmetric. No mass lesion identified. There is extensive  hypermetabolic brown fat within the jugular chains and posterior triangles of neck and shoulders shoulders. No clear hypermetabolic lymph nodes are present within the neck.  CHEST  No hypermetabolic mediastinal or hilar nodes. No suspicious pulmonary nodules on the CT scan. There is a single 3 mm nodule in the left lower lobe on image 37 is not changed from CT of 04/12/2012  There is extensive hypermetabolic paravertebral brown fat.  ABDOMEN/PELVIS  No abnormal hypermetabolic activity within the liver, pancreas, adrenal glands, or spleen. No hypermetabolic lymph nodes in the abdomen or pelvis. Spleen is normal volume.  There is mild metabolic activity associated with several inguinal lymph nodes. The largest lymph node measures 12 mm on image 194, series 4 with mild metabolic activity ( SUV max = 3.2.  SKELETON  No focal hypermetabolic activity to suggest skeletal metastasis.  IMPRESSION: 1. No convincing evidence of lymphoproliferative disorder. 2. Symmetric hyper metabolic submandibular and parotid glands. This is nonspecific finding but can be associated with Sjogrens' syndrome. 3. Extensive hypermetabolic brown fat within the neck and thorax. 4. Mild metabolic activity associated with normal size inguinal lymph nodes.   Electronically Signed   By: Suzy Bouchard M.D.   On: 02/10/2014 16:25    Labs:  CBC:  Recent Labs  02/03/14 0923 02/12/14 0730  WBC 2.8* 2.5*  HGB 12.0 11.4*  HCT 37.5 34.4*  PLT 137* 127*    COAGS:  Recent Labs  02/12/14 0730  INR 1.03  APTT 24    BMP:  Recent Labs  02/03/14 0924  NA 140  K 3.9  CO2 26  GLUCOSE 72  BUN 16.3  CALCIUM 10.0  CREATININE 0.9    LIVER FUNCTION TESTS:  Recent Labs  02/03/14 0924  BILITOT 0.35  AST 35*  ALT 30  ALKPHOS 71  PROT 9.7*  ALBUMIN 3.2*    TUMOR MARKERS: No results found for this basename: AFPTM, CEA, CA199, CHROMGRNA,  in the last 8760 hours  Assessment and Plan:  Pt with  leukocytopenia Pancytopenia Recent hx cervical LAN scheduled now for BM bx Pt aware of procedure benefits and risks and agreeable to proceed Consent signed and in chart  Thank you for this interesting consult.  I greatly enjoyed meeting [employer] and look forward to participating in their care.    I spent a total of 20 minutes face to face in clinical consultation, greater than 50% of which was counseling/coordinating care for bone marrow bx  Signed: Maxton Noreen A 02/12/2014, 8:19 AM

## 2014-02-12 NOTE — Procedures (Signed)
Interventional Radiology Procedure Note  Procedure: CT guided aspirate and core biopsy of right iliac bone Complications: None Recommendations: - Bedrest supine x 2 hrs - Hydrocodone PRN  Pain - Follow biopsy results  Signed,  Dequane Strahan K. Chasten Blaze, MD   

## 2014-02-18 ENCOUNTER — Ambulatory Visit (HOSPITAL_BASED_OUTPATIENT_CLINIC_OR_DEPARTMENT_OTHER): Payer: BC Managed Care – PPO | Admitting: Oncology

## 2014-02-18 ENCOUNTER — Encounter: Payer: Self-pay | Admitting: Oncology

## 2014-02-18 ENCOUNTER — Telehealth: Payer: Self-pay | Admitting: Oncology

## 2014-02-18 VITALS — BP 146/97 | HR 109 | Temp 98.2°F | Resp 19 | Ht 70.0 in | Wt 168.8 lb

## 2014-02-18 DIAGNOSIS — D72821 Monocytosis (symptomatic): Secondary | ICD-10-CM

## 2014-02-18 DIAGNOSIS — D649 Anemia, unspecified: Secondary | ICD-10-CM

## 2014-02-18 DIAGNOSIS — D61818 Other pancytopenia: Secondary | ICD-10-CM

## 2014-02-18 DIAGNOSIS — D72819 Decreased white blood cell count, unspecified: Secondary | ICD-10-CM

## 2014-02-18 NOTE — Telephone Encounter (Signed)
gv and printed appt sched and avs for pt for May °

## 2014-02-18 NOTE — Progress Notes (Signed)
Hematology and Oncology Follow Up Visit  Megan Reid [id number] [birth year] 48 y.o. 02/18/2014 4:27 PM   Principle Diagnosis: 48 year old woman with leukocytopenia, monocytosis and mild lymph node enlargement. She has been followed since 2013 with recurrent symptoms.   Interim History:  Megan Reid presents today for a follow up visit. Since the last visit, she underwent a bone marrow biopsy and a PET scan and tolerated it well. She still reports symptoms of bone pain associated with her lower and upper extremities. A lot of her symptoms from last visit have improved including fevers and chills and sweats. She also reported symptoms of wheezing and shortness of breath at times. Although her appetite have been relatively stable. She did not report any headaches or blurry vision or syncope. She does report some occasional lightheadedness and dizziness. She does not report any chest pain or palpitation. She does not report any cough. She is not reporting any nausea, vomiting, abdominal pain, hematochezia, melena. She did not report any frequency urgency or hesitancy. She does not report any petechiae or easy bruising. Rest of her review of systems unremarkable.  Medications: I have reviewed the patient's current medications.   Current Outpatient Prescriptions  Medication Sig Dispense Refill  . albuterol (PROVENTIL HFA;VENTOLIN HFA) 108 (90 BASE) MCG/ACT inhaler Inhale 2 puffs into the lungs 2 (two) times daily. For shortness of breath    . aspirin 500 MG tablet Take 500 mg by mouth every 6 (six) hours as needed for pain.    Marland Kitchen levalbuterol (XOPENEX HFA) 45 MCG/ACT inhaler Inhale 2 puffs into the lungs every 4 (four) hours as needed for wheezing or shortness of breath.     . montelukast (SINGULAIR) 10 MG tablet Take 10 mg by mouth every evening.      No current facility-administered medications for this visit.    Allergies:  Allergies  Allergen Reactions  . Strawberry     Unknown reaction  .  Sulfa Antibiotics Anaphylaxis, Hives and Swelling    Swelling of the face and tongue     Past Medical History, Surgical history, Social history, and Family History were reviewed and updated.   Physical Exam: Blood pressure 146/97, pulse 109, temperature 98.2 F (36.8 C), temperature source Oral, resp. rate 19, height 5' 10"  (1.778 m), weight 168 lb 12.8 oz (76.567 kg), last menstrual period 05/22/2012, SpO2 100 %. ECOG: 1 General appearance: alert awake epilated in mild distress. Head: Normocephalic, without obvious abnormality Neck: No thyromegaly or masses. Lymph nodes: Small lymphadenopathy noted in the cervical area. Heart:regular rate and rhythm, S1, S2 normal, no murmur, click, rub or gallop Lung:chest clear, expiratory wheezes noted. Abdomin: soft, non-tender, without masses or organomegaly EXT:no erythema, induration, or nodules   Lab Results: Lab Results  Component Value Date   WBC 2.5* 02/12/2014   HGB 11.4* 02/12/2014   HCT 34.4* 02/12/2014   MCV 83.9 02/12/2014   PLT 127* 02/12/2014     Chemistry      Component Value Date/Time   NA 140 02/03/2014 0924   NA 138 04/19/2012 1519   K 3.9 02/03/2014 0924   K 3.9 04/19/2012 1519   CL 102 04/19/2012 1519   CL 105 01/11/2012 1349   CO2 26 02/03/2014 0924   CO2 29 04/19/2012 1519   BUN 16.3 02/03/2014 0924   BUN 11 04/19/2012 1519   CREATININE 0.9 02/03/2014 0924   CREATININE 0.64 04/19/2012 1519      Component Value Date/Time   CALCIUM 10.0 02/03/2014 0924  CALCIUM 9.8 04/19/2012 1519   ALKPHOS 71 02/03/2014 0924   AST 35* 02/03/2014 0924   ALT 30 02/03/2014 0924   BILITOT 0.35 02/03/2014 0924     EXAM: NUCLEAR MEDICINE PET SKULL BASE TO THIGH  TECHNIQUE: 8.3 mCi F-18 FDG was injected intravenously. Full-ring PET imaging was performed from the skull base to thigh after the radiotracer. CT data was obtained and used for attenuation correction and anatomic localization.  FASTING BLOOD GLUCOSE:  Value: 82 mg/dl  COMPARISON: MR lumbar spine 08/14/2013 CT 04/12/2012  FINDINGS: NECK  There is hypermetabolic activity within the parotid glands and submandibular glands which is symmetric. No mass lesion identified. There is extensive hypermetabolic brown fat within the jugular chains and posterior triangles of neck and shoulders shoulders. No clear hypermetabolic lymph nodes are present within the neck.  CHEST  No hypermetabolic mediastinal or hilar nodes. No suspicious pulmonary nodules on the CT scan. There is a single 3 mm nodule in the left lower lobe on image 37 is not changed from CT of 04/12/2012  There is extensive hypermetabolic paravertebral brown fat.  ABDOMEN/PELVIS  No abnormal hypermetabolic activity within the liver, pancreas, adrenal glands, or spleen. No hypermetabolic lymph nodes in the abdomen or pelvis. Spleen is normal volume.  There is mild metabolic activity associated with several inguinal lymph nodes. The largest lymph node measures 12 mm on image 194, series 4 with mild metabolic activity ( SUV max = 3.2.  SKELETON  No focal hypermetabolic activity to suggest skeletal metastasis.  IMPRESSION: 1. No convincing evidence of lymphoproliferative disorder. 2. Symmetric hyper metabolic submandibular and parotid glands. This is nonspecific finding but can be associated with Sjogrens' syndrome. 3. Extensive hypermetabolic brown fat within the neck and thorax. 4. Mild metabolic activity associated with normal size inguinal lymph nodes.   Electronically Signed  Impression and Plan: A 48 year old woman with the following issues:  1. 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 The results of the PET scan as well as the bone marrow biopsy were discussed. Her PET scan did not show any evidence to suggest malignancy. Her bone marrow biopsy although had limited sample, did not show any malignancy either.The  differential diagnosis was discussed today and essentially ruled out lymphoproliferative disorder. There is no evidence of any malignancy at this time or solid tumor involvement. A lot of her symptoms are likely unrelated to hematological disorder. I urged her to follow-up with her primary care physician and a possible rheumatology referral.  2. Anemia and leukopenia: Her hemoglobin is close tonormal at this time and no evidence of bleeding.I think this is reactive findings bulky continue to observe it and recheck it again in 6 months.  3. Followup: Will be in 6 months.   Zola Button, MD 11/4/20154:27 PM

## 2014-02-19 LAB — CHROMOSOME ANALYSIS, BONE MARROW

## 2014-02-25 ENCOUNTER — Encounter (HOSPITAL_COMMUNITY): Payer: Self-pay

## 2014-05-12 ENCOUNTER — Other Ambulatory Visit: Payer: Self-pay | Admitting: Family Medicine

## 2014-05-12 ENCOUNTER — Ambulatory Visit
Admission: RE | Admit: 2014-05-12 | Discharge: 2014-05-12 | Disposition: A | Discharge status: Home / Self Care | Payer: BC Managed Care – PPO | Source: Ambulatory Visit | Attending: Family Medicine | Admitting: Family Medicine

## 2014-05-12 DIAGNOSIS — R059 Cough, unspecified: Secondary | ICD-10-CM

## 2014-05-12 DIAGNOSIS — R05 Cough: Secondary | ICD-10-CM

## 2014-05-12 DIAGNOSIS — J45901 Unspecified asthma with (acute) exacerbation: Secondary | ICD-10-CM

## 2014-05-12 DIAGNOSIS — R0602 Shortness of breath: Secondary | ICD-10-CM

## 2014-07-05 ENCOUNTER — Emergency Department: Payer: Self-pay | Admitting: Physician Assistant

## 2014-07-13 ENCOUNTER — Telehealth: Payer: Self-pay | Admitting: Oncology

## 2014-07-13 NOTE — Telephone Encounter (Signed)
Patient called requesting a letter that states she has undergone medical testing and had incurred medical costs.  She stated it is not necessary to be specific as to what testing she has had.  She needs this to assist her in getting financial aid.

## 2014-07-14 ENCOUNTER — Other Ambulatory Visit: Payer: Self-pay | Admitting: Oncology

## 2014-07-14 ENCOUNTER — Encounter: Payer: Self-pay | Admitting: Oncology

## 2014-07-14 NOTE — Telephone Encounter (Signed)
Letter done

## 2014-07-14 NOTE — Telephone Encounter (Signed)
Requested letter from dr Clelia Croft, left at front for patient p/u. Patient notified.

## 2014-07-31 ENCOUNTER — Other Ambulatory Visit: Payer: Self-pay | Admitting: Unknown Physician Specialty

## 2014-07-31 DIAGNOSIS — K1121 Acute sialoadenitis: Secondary | ICD-10-CM

## 2014-07-31 DIAGNOSIS — B260 Mumps orchitis: Secondary | ICD-10-CM

## 2014-08-19 ENCOUNTER — Ambulatory Visit: Payer: BC Managed Care – PPO

## 2014-08-19 ENCOUNTER — Ambulatory Visit (HOSPITAL_BASED_OUTPATIENT_CLINIC_OR_DEPARTMENT_OTHER): Payer: BC Managed Care – PPO | Admitting: Oncology

## 2014-08-19 ENCOUNTER — Telehealth: Payer: Self-pay | Admitting: Oncology

## 2014-08-19 ENCOUNTER — Other Ambulatory Visit (HOSPITAL_BASED_OUTPATIENT_CLINIC_OR_DEPARTMENT_OTHER): Payer: BC Managed Care – PPO

## 2014-08-19 VITALS — BP 130/76 | HR 86 | Temp 98.1°F | Resp 18 | Ht 70.0 in | Wt 159.2 lb

## 2014-08-19 DIAGNOSIS — D649 Anemia, unspecified: Secondary | ICD-10-CM

## 2014-08-19 DIAGNOSIS — D72821 Monocytosis (symptomatic): Secondary | ICD-10-CM

## 2014-08-19 DIAGNOSIS — D61818 Other pancytopenia: Secondary | ICD-10-CM

## 2014-08-19 DIAGNOSIS — D500 Iron deficiency anemia secondary to blood loss (chronic): Secondary | ICD-10-CM

## 2014-08-19 DIAGNOSIS — D72819 Decreased white blood cell count, unspecified: Secondary | ICD-10-CM | POA: Diagnosis not present

## 2014-08-19 LAB — CBC WITH DIFFERENTIAL/PLATELET
BASO%: 0.4 % (ref 0.0–2.0)
Basophils Absolute: 0 10*3/uL (ref 0.0–0.1)
EOS%: 0.3 % (ref 0.0–7.0)
Eosinophils Absolute: 0 10*3/uL (ref 0.0–0.5)
HCT: 34.8 % (ref 34.8–46.6)
HGB: 10.9 g/dL — ABNORMAL LOW (ref 11.6–15.9)
LYMPH%: 28.8 % (ref 14.0–49.7)
MCH: 26.5 pg (ref 25.1–34.0)
MCHC: 31.5 g/dL (ref 31.5–36.0)
MCV: 84.1 fL (ref 79.5–101.0)
MONO#: 0.3 10*3/uL (ref 0.1–0.9)
MONO%: 13.3 % (ref 0.0–14.0)
NEUT%: 57.2 % (ref 38.4–76.8)
NEUTROS ABS: 1.2 10*3/uL — AB (ref 1.5–6.5)
Platelets: 129 10*3/uL — ABNORMAL LOW (ref 145–400)
RBC: 4.13 10*6/uL (ref 3.70–5.45)
RDW: 14.7 % — AB (ref 11.2–14.5)
WBC: 2.1 10*3/uL — ABNORMAL LOW (ref 3.9–10.3)
lymph#: 0.6 10*3/uL — ABNORMAL LOW (ref 0.9–3.3)

## 2014-08-19 LAB — COMPREHENSIVE METABOLIC PANEL (CC13)
ALT: 18 U/L (ref 0–55)
AST: 22 U/L (ref 5–34)
Albumin: 3 g/dL — ABNORMAL LOW (ref 3.5–5.0)
Alkaline Phosphatase: 62 U/L (ref 40–150)
Anion Gap: 8 mEq/L (ref 3–11)
BUN: 18.8 mg/dL (ref 7.0–26.0)
CALCIUM: 9.5 mg/dL (ref 8.4–10.4)
CHLORIDE: 108 meq/L (ref 98–109)
CO2: 26 mEq/L (ref 22–29)
CREATININE: 0.8 mg/dL (ref 0.6–1.1)
GLUCOSE: 88 mg/dL (ref 70–140)
Potassium: 3.6 mEq/L (ref 3.5–5.1)
Sodium: 142 mEq/L (ref 136–145)
Total Bilirubin: 0.24 mg/dL (ref 0.20–1.20)
Total Protein: 8.3 g/dL (ref 6.4–8.3)

## 2014-08-19 NOTE — Progress Notes (Signed)
Hematology and Oncology Follow Up Visit  Megan Reid [id number] [birth year] 49 y.o. 08/19/2014 4:00 PM   Principle Diagnosis: 49 year old woman with leukocytopenia, monocytosis and mild lymph node enlargement. She has been followed since 2013 with recurrent symptoms. Her workup has been unrevealing.  Prior Therapy: She is status post a bone marrow biopsy which did not show any evidence of dysplasia. PET CT scan in October 2015 did not show any evidence of lymphadenopathy.  Current therapy: Observation and surveillance.   Interim History:  Megan Reid presents today for a follow up visit. Since the last visit, she doing very well and relatively asymptomatic. She has reported salivary gland inflammation and have been on steroid previously. She has been followed up with rheumatology for questionable autoimmune disorder. Her appetite have been relatively stable. She did not report any headaches or blurry vision or syncope. She does report some occasional lightheadedness and dizziness. She does not report any chest pain or palpitation. She does not report any cough. She is not reporting any nausea, vomiting, abdominal pain, hematochezia, melena. She did not report any frequency urgency or hesitancy. She does not report any petechiae or easy bruising. Rest of her review of systems unremarkable.  Medications: I have reviewed the patient's current medications.   Current Outpatient Prescriptions  Medication Sig Dispense Refill  . albuterol (PROVENTIL HFA;VENTOLIN HFA) 108 (90 BASE) MCG/ACT inhaler Inhale 2 puffs into the lungs 2 (two) times daily. For shortness of breath    . levalbuterol (XOPENEX HFA) 45 MCG/ACT inhaler Inhale 2 puffs into the lungs every 4 (four) hours as needed for wheezing or shortness of breath.     . montelukast (SINGULAIR) 10 MG tablet Take 10 mg by mouth every evening.      No current facility-administered medications for this visit.    Allergies:  Allergies  Allergen  Reactions  . Strawberry     Unknown reaction  . Sulfa Antibiotics Anaphylaxis, Hives and Swelling    Swelling of the face and tongue     Past Medical History, Surgical history, Social history, and Family History were reviewed and updated.   Physical Exam: Blood pressure 130/76, pulse 86, temperature 98.1 F (36.7 C), temperature source Oral, resp. rate 18, height 5' 10"  (1.778 m), weight 159 lb 3.2 oz (72.213 kg), last menstrual period 05/22/2012, SpO2 100 %. ECOG: 1 General appearance: alert awake epilated in mild distress. Head: Normocephalic, without obvious abnormality Neck: No thyromegaly or masses. Lymph nodes: Small lymphadenopathy noted in the cervical area. Heart:regular rate and rhythm, S1, S2 normal, no murmur, click, rub or gallop Lung:chest clear, expiratory wheezes noted. Abdomin: soft, non-tender, without masses or organomegaly EXT:no erythema, induration, or nodules   Lab Results: Lab Results  Component Value Date   WBC 2.1* 08/19/2014   HGB 10.9* 08/19/2014   HCT 34.8 08/19/2014   MCV 84.1 08/19/2014   PLT 129* 08/19/2014     Chemistry      Component Value Date/Time   NA 140 02/03/2014 0924   NA 138 03/23/2013 0101   NA 138 04/19/2012 1519   K 3.9 02/03/2014 0924   K 3.4* 03/23/2013 0101   K 3.9 04/19/2012 1519   CL 104 03/23/2013 0101   CL 102 04/19/2012 1519   CL 105 01/11/2012 1349   CO2 26 02/03/2014 0924   CO2 28 03/23/2013 0101   CO2 29 04/19/2012 1519   BUN 16.3 02/03/2014 0924   BUN 13 03/23/2013 0101   BUN 11 04/19/2012 1519  CREATININE 0.9 02/03/2014 0924   CREATININE 0.91 03/23/2013 0101   CREATININE 0.64 04/19/2012 1519      Component Value Date/Time   CALCIUM 10.0 02/03/2014 0924   CALCIUM 8.9 03/23/2013 0101   CALCIUM 9.8 04/19/2012 1519   ALKPHOS 71 02/03/2014 0924   AST 35* 02/03/2014 0924   ALT 30 02/03/2014 0924   BILITOT 0.35 02/03/2014 0924        Impression and Plan: A 50 year old woman with the following  issues:  1. 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 The results of the PET scan as well as the bone marrow biopsy did not show any evidence to suggest malignancy. The plan is to continue with active surveillance at this time. 2. Anemia and leukopenia: Her hemoglobin is close tonormal at this time and no evidence of bleeding.as his most likely reactive in nature and no intervention is needed. I will recheck her iron stores with the next visit. 3. Followup: Will be in 6 months.   Zola Button, MD 5/4/20164:00 PM

## 2014-08-19 NOTE — Telephone Encounter (Signed)
per pof to sch pt appt-gave pt copy of sch °

## 2014-08-21 ENCOUNTER — Ambulatory Visit
Admission: RE | Admit: 2014-08-21 | Discharge: 2014-08-21 | Disposition: A | Discharge status: Home / Self Care | Payer: BC Managed Care – PPO | Source: Ambulatory Visit | Attending: Unknown Physician Specialty | Admitting: Unknown Physician Specialty

## 2014-08-21 DIAGNOSIS — K1120 Sialoadenitis, unspecified: Secondary | ICD-10-CM | POA: Insufficient documentation

## 2014-08-21 DIAGNOSIS — K1121 Acute sialoadenitis: Secondary | ICD-10-CM

## 2014-08-21 MED ORDER — IOHEXOL 300 MG/ML  SOLN
75.0000 mL | Freq: Once | INTRAMUSCULAR | Status: AC | PRN
  Administered 2014-08-21: 75 mL via INTRAVENOUS

## 2014-09-17 ENCOUNTER — Telehealth: Payer: Self-pay | Admitting: Oncology

## 2014-09-17 NOTE — Telephone Encounter (Signed)
Faxed pt medical records to [facility] [phone]

## 2015-02-23 ENCOUNTER — Ambulatory Visit: Payer: BC Managed Care – PPO | Admitting: Oncology

## 2015-02-23 ENCOUNTER — Other Ambulatory Visit: Payer: BC Managed Care – PPO

## 2015-04-09 ENCOUNTER — Telehealth: Payer: Self-pay | Admitting: *Deleted

## 2015-04-09 ENCOUNTER — Encounter: Payer: Self-pay | Admitting: *Deleted

## 2015-04-09 ENCOUNTER — Other Ambulatory Visit: Payer: Self-pay | Admitting: *Deleted

## 2015-04-09 NOTE — Telephone Encounter (Signed)
Spoke with [facility], per dr Clelia Croft, labs have not changed much. POF to schedulers for f/u appt.

## 2015-04-09 NOTE — Progress Notes (Signed)
Spoke with patient, per dr Clelia Croft, lab counts not changed much. POF to schedulers for dr appt @ 1:30 with labs.

## 2015-04-09 NOTE — Telephone Encounter (Signed)
Call received in TRIAGE from pt stating she was seen by was seen by her rheumatologist - Dr Ferd Glassing yesterday and was informed " my counts have dropped and I need to follow up with Dr Clelia Croft ".  Labs not in EPIC- pt stated " my white blood cells is 20 down from 24 "  Per inquiry pt states " I feel feverish " but has not taken her temp.  Dr Valorie Roosevelt per pt did not say pt needed to be seen today.  This RN informed pt above will be sent to MD and RN at desk for review and request for an appointment.  This RN advised pt due to known history of neutropenia to monitor her temp ( verified pt has thermometer in her home ) and if temp goes higher then 100.5 she should proceed to the ER. To expeditite her her care she is to tell them she is a patient at the cancer center and she is neutropenic.  Megan Reid verbalized understanding.  Return call number given as [phone].  This RN contacted Dr Percell Locus and requested most recent labs to be faxed.

## 2015-04-09 NOTE — Telephone Encounter (Signed)
Please let her know that her white cells are not a lot different from previous counts. Please send POF for her to be scheduled with me on 12/27 1:30 with labs.

## 2015-04-13 ENCOUNTER — Ambulatory Visit (HOSPITAL_BASED_OUTPATIENT_CLINIC_OR_DEPARTMENT_OTHER): Payer: BC Managed Care – PPO | Admitting: Oncology

## 2015-04-13 ENCOUNTER — Telehealth: Payer: Self-pay | Admitting: Oncology

## 2015-04-13 ENCOUNTER — Ambulatory Visit (HOSPITAL_BASED_OUTPATIENT_CLINIC_OR_DEPARTMENT_OTHER): Payer: BC Managed Care – PPO

## 2015-04-13 VITALS — BP 142/86 | HR 82 | Temp 98.2°F | Resp 18 | Ht 70.0 in | Wt 166.6 lb

## 2015-04-13 DIAGNOSIS — D649 Anemia, unspecified: Secondary | ICD-10-CM | POA: Diagnosis not present

## 2015-04-13 DIAGNOSIS — D500 Iron deficiency anemia secondary to blood loss (chronic): Secondary | ICD-10-CM

## 2015-04-13 DIAGNOSIS — D72819 Decreased white blood cell count, unspecified: Secondary | ICD-10-CM | POA: Diagnosis not present

## 2015-04-13 DIAGNOSIS — D702 Other drug-induced agranulocytosis: Secondary | ICD-10-CM

## 2015-04-13 LAB — CBC WITH DIFFERENTIAL/PLATELET
BASO%: 0.6 % (ref 0.0–2.0)
Basophils Absolute: 0 10*3/uL (ref 0.0–0.1)
EOS%: 1.1 % (ref 0.0–7.0)
Eosinophils Absolute: 0 10*3/uL (ref 0.0–0.5)
HCT: 36.7 % (ref 34.8–46.6)
HGB: 11.7 g/dL (ref 11.6–15.9)
LYMPH#: 0.8 10*3/uL — AB (ref 0.9–3.3)
LYMPH%: 33.6 % (ref 14.0–49.7)
MCH: 26.9 pg (ref 25.1–34.0)
MCHC: 31.9 g/dL (ref 31.5–36.0)
MCV: 84.4 fL (ref 79.5–101.0)
MONO#: 0.2 10*3/uL (ref 0.1–0.9)
MONO%: 8.7 % (ref 0.0–14.0)
NEUT#: 1.3 10*3/uL — ABNORMAL LOW (ref 1.5–6.5)
NEUT%: 56 % (ref 38.4–76.8)
PLATELETS: 133 10*3/uL — AB (ref 145–400)
RBC: 4.35 10*6/uL (ref 3.70–5.45)
RDW: 14 % (ref 11.2–14.5)
WBC: 2.4 10*3/uL — AB (ref 3.9–10.3)

## 2015-04-13 LAB — COMPREHENSIVE METABOLIC PANEL
ALBUMIN: 3.2 g/dL — AB (ref 3.5–5.0)
ALK PHOS: 65 U/L (ref 40–150)
ALT: 13 U/L (ref 0–55)
ANION GAP: 6 meq/L (ref 3–11)
AST: 26 U/L (ref 5–34)
BILIRUBIN TOTAL: 0.3 mg/dL (ref 0.20–1.20)
BUN: 15.9 mg/dL (ref 7.0–26.0)
CALCIUM: 9.7 mg/dL (ref 8.4–10.4)
CO2: 26 mEq/L (ref 22–29)
CREATININE: 0.9 mg/dL (ref 0.6–1.1)
Chloride: 107 mEq/L (ref 98–109)
EGFR: 87 mL/min/{1.73_m2} — ABNORMAL LOW (ref >90)
Glucose: 87 mg/dl (ref 70–140)
Potassium: 4.2 mEq/L (ref 3.5–5.1)
Sodium: 139 mEq/L (ref 136–145)
TOTAL PROTEIN: 9.6 g/dL — AB (ref 6.4–8.3)

## 2015-04-13 LAB — FERRITIN: FERRITIN: 79 ng/mL (ref 9–269)

## 2015-04-13 LAB — IRON AND TIBC
%SAT: 30 % (ref 21–57)
IRON: 74 ug/dL (ref 41–142)
TIBC: 248 ug/dL (ref 236–444)
UIBC: 174 ug/dL (ref 120–384)

## 2015-04-13 NOTE — Progress Notes (Signed)
Hematology and Oncology Follow Up Visit  [name] [id number] 1966/01/10 49 y.o. 04/13/2015 1:48 PM   Principle Diagnosis: 49 year old woman with leukocytopenia, monocytosis likely reactive related to her autoimmune arthritis.  Prior Therapy: She is status post a bone marrow biopsy which did not show any evidence of dysplasia. PET CT scan in October 2015 did not show any evidence of lymphadenopathy.  Current therapy: Plaquenil for rheumatoid arthritis started in July 2016 under the care of Dr. Estanislado Pandy.    Interim History:  Megan Reid presents today for a follow up visit. Since the last visit, she was diagnosed with rheumatoid arthritis and started on Plaquenil under the care of Dr. Estanislado Pandy.she tolerated this medication well and helped her joint stiffness and arthritis. She was noted to have leukocytopenia and her dose was reduced to once a day. Her most recent CBC done on 04/07/2015 showed white cell count of 2.0 and neutrophil count was 44%. The absolute neutrophil count was 900. She was instructed to stop's medication at this time. She is asymptomatic with this findings but was diagnosed with a URI and currently finishing a course of antibiotic for it. Has not had any fevers or chills or opportunistic infections. Her performance status and activity level remain excellent.   She did not report any headaches or blurry vision or syncope. She does report some occasional lightheadedness and dizziness. She does not report any chest pain or palpitation. She does not report any cough. She is not reporting any nausea, vomiting, abdominal pain, hematochezia, melena. She did not report any frequency urgency or hesitancy. She does not report any petechiae or easy bruising. Rest of her review of systems unremarkable.  Medications: I have reviewed the patient's current medications.   Current Outpatient Prescriptions  Medication Sig Dispense Refill  . albuterol (PROVENTIL HFA;VENTOLIN HFA) 108 (90  BASE) MCG/ACT inhaler Inhale 2 puffs into the lungs 2 (two) times daily. For shortness of breath    . levalbuterol (XOPENEX HFA) 45 MCG/ACT inhaler Inhale 2 puffs into the lungs every 4 (four) hours as needed for wheezing or shortness of breath.     . montelukast (SINGULAIR) 10 MG tablet Take 10 mg by mouth every evening.      No current facility-administered medications for this visit.    Allergies:  Allergies  Allergen Reactions  . Strawberry Extract     Unknown reaction  . Sulfa Antibiotics Anaphylaxis, Hives and Swelling    Swelling of the face and tongue     Past Medical History, Surgical history, Social history, and Family History were reviewed and updated.   Physical Exam: Blood pressure 142/86, pulse 82, temperature 98.2 F (36.8 C), temperature source Oral, resp. rate 18, height 5' 10"  (1.778 m), weight 166 lb 9.6 oz (75.569 kg), last menstrual period 05/22/2012, SpO2 100 %. ECOG: 1 General appearance: alert awake without distress. Head: Normocephalic, without obvious abnormality no oral ulcers or lesions. Neck: No thyromegaly or masses. Lymph nodes: Small lymphadenopathy noted in the cervical area. Heart:regular rate and rhythm, S1, S2 normal, no murmur, click, rub or gallop Lung:chest clear, expiratory wheezes noted. Abdomin: soft, non-tender, without masses or organomegaly no shifting dullness or ascites. EXT:no erythema, induration, or nodules   Lab Results: Lab Results  Component Value Date   WBC 2.4* 04/13/2015   HGB 11.7 04/13/2015   HCT 36.7 04/13/2015   MCV 84.4 04/13/2015   PLT 133* 04/13/2015     Chemistry      Component Value Date/Time  NA 142 08/19/2014 1535   NA 138 03/23/2013 0101   NA 138 04/19/2012 1519   K 3.6 08/19/2014 1535   K 3.4* 03/23/2013 0101   K 3.9 04/19/2012 1519   CL 104 03/23/2013 0101   CL 102 04/19/2012 1519   CL 105 01/11/2012 1349   CO2 26 08/19/2014 1535   CO2 28 03/23/2013 0101   CO2 29 04/19/2012 1519   BUN 18.8  08/19/2014 1535   BUN 13 03/23/2013 0101   BUN 11 04/19/2012 1519   CREATININE 0.8 08/19/2014 1535   CREATININE 0.91 03/23/2013 0101   CREATININE 0.64 04/19/2012 1519      Component Value Date/Time   CALCIUM 9.5 08/19/2014 1535   CALCIUM 8.9 03/23/2013 0101   CALCIUM 9.8 04/19/2012 1519   ALKPHOS 62 08/19/2014 1535   AST 22 08/19/2014 1535   ALT 18 08/19/2014 1535   BILITOT 0.24 08/19/2014 1535        Impression and Plan: A 49 year old woman with the following issues:  1. Leukocytopenia: Repeat CBC today showed total white cell count to be slightly low but her neutrophil percentage is within normal range and absolute neutrophil count of 1300. Her previous white cell count has fluctuated within this range dating back to at least 2013. This is related to her autoimmune disorder and may be exacerbated by Plaquenil.   Her ANC appears adequate and I have no objections to starting Plaquenil about once a day dosing. As long as her [place] is above 500 I think her risk of infection is very low. She benefits from this medication reasonably well and I have recommended continuing it from a hematology standpoint. If her ANC drops below 500, different medication needs to be considered.  2. Anemia and leukopenia: Her hemoglobin is close to normal range says her platelets and these are related to her autoimmune disease. No intervention is needed.  3. Followup: Will be in 3 months.   Zola Button, MD 12/27/20161:48 PM

## 2015-04-13 NOTE — Telephone Encounter (Signed)
Gave patient avs report and appointments for March  °

## 2015-07-02 ENCOUNTER — Encounter: Payer: Self-pay | Admitting: *Deleted

## 2015-07-07 ENCOUNTER — Other Ambulatory Visit (HOSPITAL_BASED_OUTPATIENT_CLINIC_OR_DEPARTMENT_OTHER): Payer: BC Managed Care – PPO

## 2015-07-07 ENCOUNTER — Ambulatory Visit (HOSPITAL_BASED_OUTPATIENT_CLINIC_OR_DEPARTMENT_OTHER): Payer: BC Managed Care – PPO | Admitting: Oncology

## 2015-07-07 ENCOUNTER — Telehealth: Payer: Self-pay | Admitting: Oncology

## 2015-07-07 VITALS — BP 153/88 | HR 96 | Temp 98.7°F | Resp 18 | Ht 70.0 in | Wt 160.7 lb

## 2015-07-07 DIAGNOSIS — M1380 Other specified arthritis, unspecified site: Secondary | ICD-10-CM

## 2015-07-07 DIAGNOSIS — D72819 Decreased white blood cell count, unspecified: Secondary | ICD-10-CM

## 2015-07-07 DIAGNOSIS — D702 Other drug-induced agranulocytosis: Secondary | ICD-10-CM

## 2015-07-07 LAB — CBC WITH DIFFERENTIAL/PLATELET
BASO%: 0.3 % (ref 0.0–2.0)
BASOS ABS: 0 10*3/uL (ref 0.0–0.1)
EOS ABS: 0 10*3/uL (ref 0.0–0.5)
EOS%: 0.2 % (ref 0.0–7.0)
HCT: 34.3 % — ABNORMAL LOW (ref 34.8–46.6)
HEMOGLOBIN: 11 g/dL — AB (ref 11.6–15.9)
LYMPH%: 9.8 % — AB (ref 14.0–49.7)
MCH: 27 pg (ref 25.1–34.0)
MCHC: 32.1 g/dL (ref 31.5–36.0)
MCV: 84.1 fL (ref 79.5–101.0)
MONO#: 0.5 10*3/uL (ref 0.1–0.9)
MONO%: 7.9 % (ref 0.0–14.0)
NEUT%: 81.8 % — ABNORMAL HIGH (ref 38.4–76.8)
NEUTROS ABS: 5.5 10*3/uL (ref 1.5–6.5)
PLATELETS: 128 10*3/uL — AB (ref 145–400)
RBC: 4.08 10*6/uL (ref 3.70–5.45)
RDW: 13.4 % (ref 11.2–14.5)
WBC: 6.7 10*3/uL (ref 3.9–10.3)
lymph#: 0.7 10*3/uL — ABNORMAL LOW (ref 0.9–3.3)

## 2015-07-07 NOTE — Progress Notes (Signed)
Hematology and Oncology Follow Up Visit  Megan Reid [id number] 09-26-1965 50 y.o. 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  Prior Therapy: She is status post a bone marrow biopsy which did not show any evidence of dysplasia. PET CT scan in October 2015 did not show any evidence of lymphadenopathy.  Current therapy: Plaquenil for rheumatoid arthritis started in July 2016 under the care of Dr. Estanislado Pandy.    Interim History:  Megan Reid presents today for a follow up visit. Since the last visit, she continued on Plaquenil at a reduced dose with once a day instead of twice a day. She has tolerated this current dose fairly well and have improved her arthritis reasonably as well. She has reported some fatigue in the last few days as well as enlarging submandibular lymph node. She reports she had a right-sided periauricular lymph node that enlarged and now have subsided. She denied any fevers or chills or sweats. Her appetite have been fluctuating.   She does not report any decline in her performance status or activity level. She denied any constitutional symptoms at this time.    She did not report any headaches or blurry vision or syncope. She does not report any chest pain or palpitation. She does not report any cough. She is not reporting any nausea, vomiting, abdominal pain, hematochezia, melena. She did not report any frequency urgency or hesitancy. She does not report any petechiae or easy bruising. Rest of her review of systems unremarkable.  Medications: I have reviewed the patient's current medications.   Current Outpatient Prescriptions  Medication Sig Dispense Refill  . benzonatate (TESSALON) 200 MG capsule Take by mouth. As directed    . albuterol (PROVENTIL HFA;VENTOLIN HFA) 108 (90 BASE) MCG/ACT inhaler Inhale 2 puffs into the lungs 2 (two) times daily. For shortness of breath     . levalbuterol (XOPENEX HFA) 45 MCG/ACT inhaler Inhale 2 puffs into the lungs every 4 (four) hours as needed for wheezing or shortness of breath.     . montelukast (SINGULAIR) 10 MG tablet Take 10 mg by mouth every evening.      No current facility-administered medications for this visit.    Allergies:  Allergies  Allergen Reactions  . Strawberry Extract     Unknown reaction  . Sulfa Antibiotics Anaphylaxis, Hives and Swelling    Swelling of the face and tongue     Past Medical History, Surgical history, Social history, and Family History were reviewed and updated.   Physical Exam: Blood pressure 153/88, pulse 96, temperature 98.7 F (37.1 C), temperature source Oral, resp. rate 18, height _0  (1.778 m), weight 160 lb 11.2 oz (72.893 kg), last menstrual period 05/22/2012, SpO2 100 %. ECOG: 1 General appearance: alert awake woman without distress. Head: Normocephalic, without obvious abnormality no oral ulcers or lesions. Neck: Slightly tender, soft left-sided submandibular lymph node. No other adenopathy noted. Lymph nodes: Small lymphadenopathy noted in the cervical area. Heart:regular rate and rhythm, S1, S2 normal, no murmur, click, rub or gallop Lung:chest clear, expiratory wheezes noted. Abdomin: soft, non-tender, without masses or organomegaly no rebound or guarding. EXT:no erythema, induration, or nodules   Lab Results: Lab Results  Component Value Date   WBC 6.7 07/07/2015   HGB 11.0* 07/07/2015   HCT 34.3* 07/07/2015   MCV 84.1 07/07/2015   PLT 128* 07/07/2015     Chemistry      Component Value Date/Time  NA 139 04/13/2015 1323   NA 138 03/23/2013 0101   NA 138 04/19/2012 1519   K 4.2 04/13/2015 1323   K 3.4* 03/23/2013 0101   K 3.9 04/19/2012 1519   CL 104 03/23/2013 0101   CL 102 04/19/2012 1519   CL 105 01/11/2012 1349   CO2 26 04/13/2015 1323   CO2 28 03/23/2013 0101   CO2 29 04/19/2012 1519   BUN 15.9 04/13/2015 1323   BUN 13 03/23/2013  0101   BUN 11 04/19/2012 1519   CREATININE 0.9 04/13/2015 1323   CREATININE 0.91 03/23/2013 0101   CREATININE 0.64 04/19/2012 1519      Component Value Date/Time   CALCIUM 9.7 04/13/2015 1323   CALCIUM 8.9 03/23/2013 0101   CALCIUM 9.8 04/19/2012 1519   ALKPHOS 65 04/13/2015 1323   AST 26 04/13/2015 1323   ALT 13 04/13/2015 1323   BILITOT 0.30 04/13/2015 1323        Impression and Plan: A 50 year old woman with the following issues:  1. Leukocytopenia: Likely related due to rheumatoid arthritis as well as Plaquenil therapy. Her CBC was reviewed today and showed her white cell count is within normal range. No further intervention or workup is needed at this time. I think it is safe to continue Plaquenil from a hematological standpoint.   2. Cervical adenopathy: Appears to be reactive or infectious in etiology. She had that multiple lymph node biopsies as well as lymphoma workup in the past that has been unrevealing. I recommended supportive care for the time being and obtain a biopsy if they do not respond to conservative therapy.  3. Followup: Will be in 6 months.Zola Button, MD 3/22/20174:25 PM

## 2015-07-07 NOTE — Telephone Encounter (Signed)
Gave patient avs report and appointments for August.  °

## 2015-09-29 LAB — BASIC METABOLIC PANEL
BUN: 18 mg/dL (ref 4–21)
CREATININE: 0.8 mg/dL (ref 0.5–1.1)
GLUCOSE: 92 mg/dL
POTASSIUM: 4.5 mmol/L (ref 3.4–5.3)
SODIUM: 142 mmol/L (ref 137–147)

## 2015-09-29 LAB — HEPATIC FUNCTION PANEL
ALK PHOS: 66 U/L (ref 25–125)
ALT: 19 U/L (ref 7–35)
AST: 29 U/L (ref 13–35)
BILIRUBIN, TOTAL: 0.2 mg/dL

## 2015-09-29 LAB — CBC AND DIFFERENTIAL
HCT: 35 % — AB (ref 36–46)
HEMOGLOBIN: 12.2 g/dL (ref 12.0–16.0)
Platelets: 166 10*3/uL (ref 150–399)
WBC: 2.1 10*3/mL

## 2015-12-04 ENCOUNTER — Telehealth: Payer: Self-pay | Admitting: Oncology

## 2015-12-04 NOTE — Telephone Encounter (Signed)
S/w pt, advised appt 8/25 chgd due to md pal. Pt says she really needs to be seen soon. She has some face swelling that she wanted evaluated while it's happening. Gave pt appt for 8/23 at 3.30.

## 2015-12-08 ENCOUNTER — Telehealth: Payer: Self-pay | Admitting: Oncology

## 2015-12-08 ENCOUNTER — Ambulatory Visit (HOSPITAL_BASED_OUTPATIENT_CLINIC_OR_DEPARTMENT_OTHER): Payer: BC Managed Care – PPO | Admitting: Oncology

## 2015-12-08 ENCOUNTER — Other Ambulatory Visit (HOSPITAL_BASED_OUTPATIENT_CLINIC_OR_DEPARTMENT_OTHER): Payer: BC Managed Care – PPO

## 2015-12-08 VITALS — BP 147/97 | HR 74 | Temp 98.1°F | Resp 18

## 2015-12-08 DIAGNOSIS — R5383 Other fatigue: Secondary | ICD-10-CM | POA: Diagnosis not present

## 2015-12-08 DIAGNOSIS — R599 Enlarged lymph nodes, unspecified: Secondary | ICD-10-CM

## 2015-12-08 DIAGNOSIS — D61818 Other pancytopenia: Secondary | ICD-10-CM

## 2015-12-08 DIAGNOSIS — D72821 Monocytosis (symptomatic): Secondary | ICD-10-CM

## 2015-12-08 DIAGNOSIS — D72819 Decreased white blood cell count, unspecified: Secondary | ICD-10-CM

## 2015-12-08 LAB — CBC WITH DIFFERENTIAL/PLATELET
BASO%: 1.1 % (ref 0.0–2.0)
Basophils Absolute: 0 10*3/uL (ref 0.0–0.1)
EOS ABS: 0 10*3/uL (ref 0.0–0.5)
EOS%: 1 % (ref 0.0–7.0)
HCT: 35.5 % (ref 34.8–46.6)
HGB: 11.4 g/dL — ABNORMAL LOW (ref 11.6–15.9)
LYMPH%: 41.7 % (ref 14.0–49.7)
MCH: 26.8 pg (ref 25.1–34.0)
MCHC: 32 g/dL (ref 31.5–36.0)
MCV: 83.7 fL (ref 79.5–101.0)
MONO#: 0.3 10*3/uL (ref 0.1–0.9)
MONO%: 13.4 % (ref 0.0–14.0)
NEUT%: 42.8 % (ref 38.4–76.8)
NEUTROS ABS: 0.9 10*3/uL — AB (ref 1.5–6.5)
Platelets: 125 10*3/uL — ABNORMAL LOW (ref 145–400)
RBC: 4.24 10*6/uL (ref 3.70–5.45)
RDW: 13.4 % (ref 11.2–14.5)
WBC: 2 10*3/uL — AB (ref 3.9–10.3)
lymph#: 0.8 10*3/uL — ABNORMAL LOW (ref 0.9–3.3)

## 2015-12-08 NOTE — Progress Notes (Signed)
Hematology and Oncology Follow Up Visit  Megan Reid [id number] February 04, 1966 50 y.o. 12/08/2015 4:31 PM   Principle Diagnosis: 50 year old woman with leukocytopenia, monocytosis. These findings are reactive related to her autoimmune arthritis.  Prior Therapy: She is status post a bone marrow biopsy which did not show any evidence of dysplasia. PET CT scan in October 2015 did not show any evidence of lymphadenopathy.  Current therapy: Plaquenil for rheumatoid arthritis started in July 2016 under the care of Dr. Estanislado Pandy.    Interim History:  Megan Reid presents today for a follow up visit. Since the last visit, she reports no major changes in her health. She does report increased fatigue, weight gain and irregularity in her menstrual cycles. She continued on Plaquenil at a reduced dose with once a day instead of twice a day. She has tolerated this current dose fairly well and have improved her arthritis.   At any constitutional symptoms of fevers or chills or sweats. She denied any GI or GU bleeding. She feels that she is approaching menopause and attributes some of the symptoms to that. She does report periodic back pain but still able to function properly and work full time.    She did not report any headaches or blurry vision or syncope. She does not report any chest pain or palpitation. She does not report any cough. She is not reporting any nausea, vomiting, abdominal pain, hematochezia, melena. She did not report any frequency urgency or hesitancy. She does not report any petechiae or easy bruising. Rest of her review of systems unremarkable.  Medications: I have reviewed the patient's current medications.   Current Outpatient Prescriptions  Medication Sig Dispense Refill  . albuterol (PROVENTIL HFA;VENTOLIN HFA) 108 (90 BASE) MCG/ACT inhaler Inhale 2 puffs into the lungs 2 (two) times daily. For shortness of breath    . hydroxychloroquine (PLAQUENIL) 200 MG tablet Take 200 mg by mouth  2 (two) times daily.  0  . montelukast (SINGULAIR) 10 MG tablet Take 10 mg by mouth every evening.      No current facility-administered medications for this visit.     Allergies:  Allergies  Allergen Reactions  . Strawberry Extract Other (See Comments)    Unknown reaction  . Sulfa Antibiotics Anaphylaxis, Hives and Swelling    Swelling of the face and tongue     Past Medical History, Surgical history, Social history, and Family History were reviewed and updated.   Physical Exam: Blood pressure (!) 147/97, pulse 74, temperature 98.1 F (36.7 C), temperature source Oral, resp. rate 18, last menstrual period 05/22/2012, SpO2 100 %. ECOG: 1 General appearance: Well-appearing woman without distress. Head: Normocephalic, without obvious abnormality no oral rash noted. Neck: No adenopathy palpated today's exam. Lymph nodes: No lymphadenopathy noted. Heart:regular rate and rhythm, S1, S2 normal, no murmur, click, rub or gallop Lung:chest clear, expiratory wheezes noted. Abdomin: soft, non-tender, without masses or organomegaly no shifting dullness or ascites. EXT:no erythema, induration, or nodules   Lab Results: Lab Results  Component Value Date   WBC 2.0 (L) 12/08/2015   HGB 11.4 (L) 12/08/2015   HCT 35.5 12/08/2015   MCV 83.7 12/08/2015   PLT 125 (L) 12/08/2015     Chemistry      Component Value Date/Time   NA 139 04/13/2015 1323   K 4.2 04/13/2015 1323   CL 104 03/23/2013 0101   CL 105 01/11/2012 1349   CO2 26 04/13/2015 1323   BUN 15.9 04/13/2015 1323   CREATININE 0.9 04/13/2015  1323      Component Value Date/Time   CALCIUM 9.7 04/13/2015 1323   ALKPHOS 65 04/13/2015 1323   AST 26 04/13/2015 1323   ALT 13 04/13/2015 1323   BILITOT 0.30 04/13/2015 1323        Impression and Plan:  50 year old woman with the following issues:   1. Leukocytopenia: Related due to rheumatoid arthritis as well as Plaquenil therapy.   Her CBC today was personally reviewed  and showed mild decrease in her white cell count with an absolute neutrophil count of 900. She does not have any history of recurrent infections and periodic monitoring of her white cell count is recommended.   2. Cervical adenopathy: Reactive in nature and could be related to her rheumatoid arthritis. Her workup for malignancy have been unrevealing including lymph node biopsy as well as bone marrow biopsy.  3. Fatigue, weight gain and menstrual irregularity: She could have thyroid disease and she has a physical exam upcoming with her primary care provider and I have recommended checking her thyroid function at that time.  4. Followup: Will be in 6 months.Zola Button, MD 8/23/20174:31 PM

## 2015-12-08 NOTE — Telephone Encounter (Signed)
AVS REPORT AND APPT SCHD GIVEN PER 08/23 LOS. °

## 2015-12-10 ENCOUNTER — Other Ambulatory Visit: Payer: Self-pay | Admitting: Obstetrics and Gynecology

## 2015-12-10 ENCOUNTER — Ambulatory Visit: Payer: BC Managed Care – PPO | Admitting: Oncology

## 2015-12-10 ENCOUNTER — Other Ambulatory Visit: Payer: BC Managed Care – PPO

## 2015-12-10 DIAGNOSIS — Z1231 Encounter for screening mammogram for malignant neoplasm of breast: Secondary | ICD-10-CM

## 2015-12-14 ENCOUNTER — Ambulatory Visit
Admission: RE | Admit: 2015-12-14 | Discharge: 2015-12-14 | Disposition: A | Discharge status: Home / Self Care | Payer: BC Managed Care – PPO | Source: Ambulatory Visit | Attending: Obstetrics and Gynecology | Admitting: Obstetrics and Gynecology

## 2015-12-14 DIAGNOSIS — Z1231 Encounter for screening mammogram for malignant neoplasm of breast: Secondary | ICD-10-CM

## 2016-02-14 ENCOUNTER — Encounter: Payer: Self-pay | Admitting: *Deleted

## 2016-02-14 ENCOUNTER — Other Ambulatory Visit: Payer: Self-pay | Admitting: Rheumatology

## 2016-02-14 ENCOUNTER — Telehealth: Payer: Self-pay | Admitting: Rheumatology

## 2016-02-14 DIAGNOSIS — M51369 Other intervertebral disc degeneration, lumbar region without mention of lumbar back pain or lower extremity pain: Secondary | ICD-10-CM

## 2016-02-14 DIAGNOSIS — M359 Systemic involvement of connective tissue, unspecified: Secondary | ICD-10-CM

## 2016-02-14 DIAGNOSIS — R5383 Other fatigue: Secondary | ICD-10-CM | POA: Insufficient documentation

## 2016-02-14 DIAGNOSIS — M19041 Primary osteoarthritis, right hand: Secondary | ICD-10-CM | POA: Insufficient documentation

## 2016-02-14 DIAGNOSIS — M5136 Other intervertebral disc degeneration, lumbar region: Secondary | ICD-10-CM

## 2016-02-14 DIAGNOSIS — Z79899 Other long term (current) drug therapy: Secondary | ICD-10-CM

## 2016-02-14 DIAGNOSIS — E559 Vitamin D deficiency, unspecified: Secondary | ICD-10-CM

## 2016-02-14 DIAGNOSIS — M170 Bilateral primary osteoarthritis of knee: Secondary | ICD-10-CM

## 2016-02-14 DIAGNOSIS — M19042 Primary osteoarthritis, left hand: Secondary | ICD-10-CM

## 2016-02-14 DIAGNOSIS — K2970 Gastritis, unspecified, without bleeding: Secondary | ICD-10-CM

## 2016-02-14 DIAGNOSIS — J45909 Unspecified asthma, uncomplicated: Secondary | ICD-10-CM | POA: Insufficient documentation

## 2016-02-14 DIAGNOSIS — G43909 Migraine, unspecified, not intractable, without status migrainosus: Secondary | ICD-10-CM

## 2016-02-14 DIAGNOSIS — M3501 Sicca syndrome with keratoconjunctivitis: Secondary | ICD-10-CM | POA: Insufficient documentation

## 2016-02-14 DIAGNOSIS — M2550 Pain in unspecified joint: Secondary | ICD-10-CM | POA: Insufficient documentation

## 2016-02-14 HISTORY — DX: Other intervertebral disc degeneration, lumbar region without mention of lumbar back pain or lower extremity pain: M51.369

## 2016-02-14 HISTORY — DX: Migraine, unspecified, not intractable, without status migrainosus: G43.909

## 2016-02-14 HISTORY — DX: Primary osteoarthritis, right hand: M19.041

## 2016-02-14 HISTORY — DX: Other intervertebral disc degeneration, lumbar region: M51.36

## 2016-02-14 HISTORY — DX: Pain in unspecified joint: M25.50

## 2016-02-14 HISTORY — DX: Bilateral primary osteoarthritis of knee: M17.0

## 2016-02-14 HISTORY — DX: Other fatigue: R53.83

## 2016-02-14 HISTORY — DX: Primary osteoarthritis, left hand: M19.042

## 2016-02-14 HISTORY — DX: Vitamin D deficiency, unspecified: E55.9

## 2016-02-14 HISTORY — DX: Gastritis, unspecified, without bleeding: K29.70

## 2016-02-14 HISTORY — DX: Systemic involvement of connective tissue, unspecified: M35.9

## 2016-02-14 NOTE — Telephone Encounter (Signed)
Patient is requesting lab orders be sent to [facility] on [address] In [place]. Patient has scheduled appointment for tomorrow and would like to get labs done today.

## 2016-02-14 NOTE — Telephone Encounter (Signed)
I have put in orders for her to have the standing labs at [facility].

## 2016-02-14 NOTE — Progress Notes (Signed)
*IMAGE* Office Visit Note  Patient: Megan Reid             Date of Birth: 06-10-1965           MRN: [id number]             PCP: Antony Blackbird, MD Referring: Antony Blackbird, MD Visit Date: 02/15/2016 Occupation:Teacher   Subjective:  Fatigue  History of Present Illness: Aleera Gilcrease is a 50 y.o. female with the history of Sjogren's syndrome. She states she continues to have dry mouth and dry eyes. She has not had any recent problems with parotid swelling or lymph node enlargement. She denies any joint pain or joint swelling her main concern today is fatigue. She denies any insomnia. She does not feel well rested in the morning.   She is also having lower back discomfort. She has history of disc disease of lumbar spine and has chronic lower back pain she states she's tried physical therapy in the past which was not very helpful. She denies any radiculopathy.  Activities of Daily Living:  Patient reports morning stiffness for <5 minutes.   Patient Denies nocturnal pain.  Difficulty dressing/grooming: Denies Difficulty climbing stairs: Reports Difficulty getting out of chair: Reports Difficulty using hands for taps, buttons, cutlery, and/or writing: Denies   Review of Systems  Constitutional: Positive for fatigue. Negative for night sweats, weight gain, weight loss and weakness.  HENT: Positive for mouth dryness. Negative for mouth sores, trouble swallowing, trouble swallowing and nose dryness.   Eyes: Positive for dryness. Negative for pain, redness and visual disturbance.  Respiratory: Positive for shortness of breath. Negative for cough and difficulty breathing.   Cardiovascular: Negative for chest pain, palpitations, hypertension, irregular heartbeat and swelling in legs/feet.  Gastrointestinal: Negative for blood in stool, constipation and diarrhea.  Endocrine: Negative for increased urination.  Genitourinary: Negative for vaginal dryness.  Musculoskeletal: Positive for  arthralgias and joint pain. Negative for joint swelling, myalgias, muscle weakness, morning stiffness, muscle tenderness and myalgias.  Skin: Negative for color change, rash, hair loss, skin tightness, ulcers and sensitivity to sunlight.  Allergic/Immunologic: Negative for susceptible to infections.  Neurological: Negative for dizziness, memory loss and night sweats.  Hematological: Negative for swollen glands.  Psychiatric/Behavioral: Negative for depressed mood and sleep disturbance. The patient is not nervous/anxious.     PMFS History:  Patient Active Problem List   Diagnosis Date Noted  . Autoimmune disease ([place]) 02/14/2016  . Fatigue 02/14/2016  . Arthralgia 02/14/2016  . DDD (degenerative disc disease), lumbar 02/14/2016  . Osteoarthritis of both knees 02/14/2016  . Osteoarthritis of both hands 02/14/2016  . Asthma 02/14/2016  . Migraines 02/14/2016  . Gastritis 02/14/2016  . Vitamin D deficiency 02/14/2016  . Sjogren's disease ([place]) 02/14/2016  . High risk medication use 02/14/2016    Past Medical History:  Diagnosis Date  . Arthralgia 02/14/2016  . Asthma   . Autoimmune disease ([place]) 02/14/2016   Positive RF 139 CCP Negative  Positive ANA 1:640 Positive Ro Positive La Elevated ESR 105   . DDD (degenerative disc disease), lumbar 02/14/2016  . Fatigue 02/14/2016  . Gastritis 02/14/2016  . Hot flashes   . Migraines 02/14/2016  . Osteoarthritis of both hands 02/14/2016  . Osteoarthritis of both knees 02/14/2016  . Vitamin D deficiency 02/14/2016    Family History  Problem Relation Age of Onset  . Kidney failure Mother   . Heart failure Mother   . COPD Father   . Hypertension Sister  Past Surgical History:  Procedure Laterality Date  . TUBAL LIGATION     Social History   Social History Narrative  . No narrative on file     Objective: Vital Signs: BP 128/80 (BP Location: Left Arm, Patient Position: Sitting, Cuff Size: Large)   Pulse 79   Resp 12   Ht  5' 10"  (1.778 m)   Wt 174 lb (78.9 kg)   LMP 05/22/2012   BMI 24.97 kg/m    Physical Exam  Constitutional: She is oriented to person, place, and time. She appears well-developed and well-nourished.  HENT:  Head: Normocephalic and atraumatic.  Eyes: Conjunctivae and EOM are normal.  Neck: Normal range of motion.  Cardiovascular: Normal rate, regular rhythm, normal heart sounds and intact distal pulses.   Pulmonary/Chest: Effort normal and breath sounds normal.  Abdominal: Soft. Bowel sounds are normal.  Lymphadenopathy:    She has no cervical adenopathy.  Neurological: She is alert and oriented to person, place, and time.  Skin: Skin is warm and dry. Capillary refill takes 2 to 3 seconds.  Psychiatric: She has a normal mood and affect. Her behavior is normal.  Vitals reviewed.    Musculoskeletal Exam: She is good range of motion of her C-spine and thoracic lumbar spine, good range of motion of her shoulder joints, elbow joints, wrist joints, MCPs, PIPs, DIPs,. There was no synovitis in her upper extremities. Hip joints, knee joints, ankle joints, MTPs PIPs DIPs were all good range of motion with no synovitis.  CDAI Exam: CDAI Homunculus Exam:   Joint Counts:  CDAI Tender Joint count: 0 CDAI Swollen Joint count: 0     Investigation: Findings:  12/08/15: CBC WBC 2, PLT 125 09/29/15: CMP normal 07/2014: TB neg, Hep neg, Ig abnormal, IFE neg    Imaging: No results found.  Speciality Comments: No specialty comments available.    Procedures:  No procedures performed Allergies: Strawberry extract and Sulfa antibiotics   Assessment / Plan: Visit Diagnoses:  Fatigue: She complains of extreme fatigue. A recent workup by hematology was unremarkable. She has chronic neutropenia which is stable. She also has thrombocytopenia. I'll schedule sleep study.   Vitamin D deficiency: She has history of vitamin D deficiency. She's not taking any supplements at this time. I'll check  her levels again today.  Sjogren's syndrome,  - ANA, + Ro, + La, + RF,Parotitis, lymphadenopathy, fatigue,Low WBC and PLT. She has no clinical features of rheumatoid arthritis she continues to have sicca symptoms. Which are controlled with over-the-counter meds and Plaquenil. She is on low-dose Plaquenil.  High risk medication use: Eye exam is a normal per patient. We will continue to monitor her labs every 3 months. I will obtain ANA C3-C4 ENA sedimentation rate UA vitamin D anticardiolipin and lupus anticoagulant and beta-2 today. The she's complaining of fatigue I will check TSH and CK as well.  DDD (degenerative disc disease), lumbar - L5-S1 listhesis: She had inadequate response to physical therapy in the past which she was not satisfied with. I will resent her for physical therapy.  Primary osteoarthritis of both hands: Joint protection and muscle strengthening was discussed.  Primary osteoarthritis of both knees : Weight loss, muscle strengthening was discussed.   Orders: Orders Placed This Encounter  Procedures  . COMPLETE METABOLIC PANEL WITH GFR  . Urinalysis, Routine w reflex microscopic (not at [facility])  . VITAMIN D 25 Hydroxy (Vit-D Deficiency, Fractures)  . CK  . TSH  . ANA  . C3 and C4  .  Sedimentation rate  . ENA 9 Panel  . Cardiolipin antibodies, IgG, IgM, IgA  . Lupus anticoagulant panel  . Beta-2 glycoprotein antibodies  . Protein electrophoresis, serum  . Rheumatoid factor  . Ambulatory referral to Physical Therapy  . Ambulatory referral to Sleep Studies   No orders of the defined types were placed in this encounter.   Face-to-face time spent with patient was 35 minutes. 50% of time was spent in counseling and coordination of care.  Follow-Up Instructions: No Follow-up on file.

## 2016-02-15 ENCOUNTER — Encounter: Payer: Self-pay | Admitting: Rheumatology

## 2016-02-15 ENCOUNTER — Ambulatory Visit (INDEPENDENT_AMBULATORY_CARE_PROVIDER_SITE_OTHER): Payer: BC Managed Care – PPO | Admitting: Rheumatology

## 2016-02-15 VITALS — BP 128/80 | HR 79 | Resp 12 | Ht 70.0 in | Wt 174.0 lb

## 2016-02-15 DIAGNOSIS — M19042 Primary osteoarthritis, left hand: Secondary | ICD-10-CM | POA: Diagnosis not present

## 2016-02-15 DIAGNOSIS — E559 Vitamin D deficiency, unspecified: Secondary | ICD-10-CM | POA: Diagnosis not present

## 2016-02-15 DIAGNOSIS — Z79899 Other long term (current) drug therapy: Secondary | ICD-10-CM

## 2016-02-15 DIAGNOSIS — M3500 Sicca syndrome, unspecified: Secondary | ICD-10-CM

## 2016-02-15 DIAGNOSIS — R5383 Other fatigue: Secondary | ICD-10-CM | POA: Diagnosis not present

## 2016-02-15 DIAGNOSIS — M19041 Primary osteoarthritis, right hand: Secondary | ICD-10-CM

## 2016-02-15 DIAGNOSIS — M5136 Other intervertebral disc degeneration, lumbar region: Secondary | ICD-10-CM

## 2016-02-15 DIAGNOSIS — G8929 Other chronic pain: Secondary | ICD-10-CM | POA: Diagnosis not present

## 2016-02-15 DIAGNOSIS — M545 Low back pain: Secondary | ICD-10-CM | POA: Diagnosis not present

## 2016-02-15 DIAGNOSIS — M170 Bilateral primary osteoarthritis of knee: Secondary | ICD-10-CM | POA: Diagnosis not present

## 2016-02-15 NOTE — Patient Instructions (Signed)
Physical therapy referral for lower back pain. Sleep study referral for fatigue. Labs obtained today

## 2016-02-16 LAB — URINALYSIS, ROUTINE W REFLEX MICROSCOPIC
Bilirubin Urine: NEGATIVE
Glucose, UA: NEGATIVE
HGB URINE DIPSTICK: NEGATIVE
Ketones, ur: NEGATIVE
LEUKOCYTES UA: NEGATIVE
NITRITE: NEGATIVE
PH: 5.5 (ref 5.0–8.0)
PROTEIN: NEGATIVE
Specific Gravity, Urine: 1.027 (ref 1.001–1.035)

## 2016-02-16 LAB — COMPLETE METABOLIC PANEL WITH GFR
ALK PHOS: 53 U/L (ref 33–130)
ALT: 14 U/L (ref 6–29)
AST: 28 U/L (ref 10–35)
Albumin: 3.7 g/dL (ref 3.6–5.1)
BILIRUBIN TOTAL: 0.2 mg/dL (ref 0.2–1.2)
BUN: 18 mg/dL (ref 7–25)
CO2: 27 mmol/L (ref 20–31)
Calcium: 9.7 mg/dL (ref 8.6–10.4)
Chloride: 104 mmol/L (ref 98–110)
Creat: 0.83 mg/dL (ref 0.50–1.05)
GFR, EST NON AFRICAN AMERICAN: 82 mL/min (ref >=60)
GLUCOSE: 86 mg/dL (ref 65–99)
POTASSIUM: 4 mmol/L (ref 3.5–5.3)
SODIUM: 138 mmol/L (ref 135–146)
TOTAL PROTEIN: 9.8 g/dL — AB (ref 6.1–8.1)

## 2016-02-16 LAB — CK: CK TOTAL: 117 U/L (ref 7–177)

## 2016-02-16 LAB — ANTI-NUCLEAR AB-TITER (ANA TITER)

## 2016-02-16 LAB — RHEUMATOID FACTOR: Rhuematoid fact SerPl-aCnc: 139 IU/mL — ABNORMAL HIGH (ref <14)

## 2016-02-16 LAB — CARDIOLIPIN ANTIBODIES, IGG, IGM, IGA
Anticardiolipin IgA: 17 [APL'U] — ABNORMAL HIGH
Anticardiolipin IgG: <14 [GPL'U]
Anticardiolipin IgM: <12 [MPL'U]

## 2016-02-16 LAB — C3 AND C4
C3 COMPLEMENT: 119 mg/dL (ref 90–180)
C4 COMPLEMENT: 21 mg/dL (ref 16–47)

## 2016-02-16 LAB — TSH: TSH: 0.84 m[IU]/L

## 2016-02-16 LAB — ANA: ANA: POSITIVE — AB

## 2016-02-16 LAB — SJOGRENS SYNDROME-B EXTRACTABLE NUCLEAR ANTIBODY: SSB (LA) (ENA) ANTIBODY, IGG: POSITIVE — AB

## 2016-02-16 LAB — SEDIMENTATION RATE: Sed Rate: 130 mm/hr — ABNORMAL HIGH (ref 0–30)

## 2016-02-16 LAB — VITAMIN D 25 HYDROXY (VIT D DEFICIENCY, FRACTURES): Vit D, 25-Hydroxy: 24 ng/mL — ABNORMAL LOW (ref 30–100)

## 2016-02-16 LAB — SJOGRENS SYNDROME-A EXTRACTABLE NUCLEAR ANTIBODY: SSA (Ro) (ENA) Antibody, IgG: >8 — ABNORMAL HIGH

## 2016-02-17 LAB — PROTEIN ELECTROPHORESIS, SERUM
Albumin ELP: 3.7 g/dL — ABNORMAL LOW (ref 3.8–4.8)
Alpha-1-Globulin: 0.3 g/dL (ref 0.2–0.3)
Alpha-2-Globulin: 0.6 g/dL (ref 0.5–0.9)
BETA GLOBULIN: 0.7 g/dL — AB (ref 0.4–0.6)
Beta 2: 0.6 g/dL — ABNORMAL HIGH (ref 0.2–0.5)
Gamma Globulin: 3.9 g/dL — ABNORMAL HIGH (ref 0.8–1.7)
Total Protein, Serum Electrophoresis: 9.8 g/dL — ABNORMAL HIGH (ref 6.1–8.1)

## 2016-02-17 LAB — BETA-2 GLYCOPROTEIN ANTIBODIES: Beta-2 Glyco I IgG: <9 SGU (ref <=20)

## 2016-02-18 ENCOUNTER — Telehealth: Payer: Self-pay | Admitting: Radiology

## 2016-02-18 LAB — RFX PTT-LA W/RFX TO HEX PHASE CONF: PTT-LA Screen: 38 s (ref <=40)

## 2016-02-18 LAB — RFX DRVVT SCR W/RFLX CONF 1:1 MIX: DRVVT SCREEN: 38 s (ref <=45)

## 2016-02-18 LAB — LUPUS ANTICOAGULANT PANEL

## 2016-02-18 NOTE — Telephone Encounter (Signed)
Called patient advised her sed rate is quite elevated. I have asked her to call Dr Clelia Croft for a sooner appointment she states she will call his office now. She is not sch to see him until Feb

## 2016-02-18 NOTE — Progress Notes (Signed)
Thank you for your prompt response.

## 2016-02-18 NOTE — Telephone Encounter (Signed)
Call patient regarding labs / she will need to review these with Dr Clelia Croft, Dr Corliss Skains has sent a note to him

## 2016-02-18 NOTE — Progress Notes (Signed)
Patient has high sedimentation rate 130. I evaluated her this week. Despite having high ANA titer and positive rheumatoid factor, her autoimmune diseases not active. I cannot explain her high sedimentation rate wrist on her autoimmune disease. She has slightly abnormal SPEP. She complains of extreme fatigue. She has low vitamin D and I'll give her vitamin D. I still cannot explain her high sedimentation rate and fatigue .I would appreciate your input regarding this.

## 2016-02-22 ENCOUNTER — Telehealth: Payer: Self-pay | Admitting: *Deleted

## 2016-02-22 NOTE — Telephone Encounter (Signed)
Patient calling to get an earlier appt than February, Sed Rate elevated per  dr Titus Dubin

## 2016-02-22 NOTE — Telephone Encounter (Signed)
Please let her know that I discussed her situation with Dr. Titus Dubin. No changes needed for now.

## 2016-02-22 NOTE — Telephone Encounter (Signed)
Spoke with patient, let her know that dr Clelia Croft spoke with her primary physician and no changes are needed for now.

## 2016-02-25 ENCOUNTER — Other Ambulatory Visit: Payer: Self-pay | Admitting: Obstetrics and Gynecology

## 2016-02-25 ENCOUNTER — Other Ambulatory Visit (HOSPITAL_COMMUNITY)
Admission: RE | Admit: 2016-02-25 | Discharge: 2016-02-25 | Disposition: A | Discharge status: Home / Self Care | Payer: BC Managed Care – PPO | Source: Ambulatory Visit | Attending: Obstetrics and Gynecology | Admitting: Obstetrics and Gynecology

## 2016-02-25 DIAGNOSIS — Z1151 Encounter for screening for human papillomavirus (HPV): Secondary | ICD-10-CM | POA: Insufficient documentation

## 2016-02-25 DIAGNOSIS — Z01411 Encounter for gynecological examination (general) (routine) with abnormal findings: Secondary | ICD-10-CM | POA: Diagnosis present

## 2016-03-02 LAB — CYTOLOGY - PAP
Diagnosis: UNDETERMINED — AB
HPV: DETECTED — AB

## 2016-03-13 ENCOUNTER — Telehealth: Payer: Self-pay | Admitting: Rheumatology

## 2016-03-13 NOTE — Telephone Encounter (Signed)
See note from patient, I do not think we can help with her request, if she needs treatment for abnormal sleep study, she will have to go through PCP, correct ?

## 2016-03-13 NOTE — Telephone Encounter (Signed)
Patient would like results of the sleep study sent to Dr. Corliss Skains since she no longer has a PCP due to him not working for the same practice anymore. Patient states Dr. Corliss Skains had ordered a sleep study but this one was done at a different location. Could she have results sent here and Dr. Corliss Skains review them with her? Please call patient.

## 2016-03-14 NOTE — Telephone Encounter (Signed)
yes

## 2016-03-14 NOTE — Telephone Encounter (Signed)
Thank you, I have called patient to advise.  

## 2016-05-01 ENCOUNTER — Other Ambulatory Visit: Payer: Self-pay | Admitting: Obstetrics and Gynecology

## 2016-05-16 ENCOUNTER — Encounter: Payer: Self-pay | Admitting: Rheumatology

## 2016-05-16 ENCOUNTER — Ambulatory Visit (INDEPENDENT_AMBULATORY_CARE_PROVIDER_SITE_OTHER): Payer: BC Managed Care – PPO | Admitting: Rheumatology

## 2016-05-16 VITALS — BP 140/91 | HR 81 | Resp 12 | Ht 70.0 in | Wt 173.0 lb

## 2016-05-16 DIAGNOSIS — R748 Abnormal levels of other serum enzymes: Secondary | ICD-10-CM

## 2016-05-16 DIAGNOSIS — R778 Other specified abnormalities of plasma proteins: Secondary | ICD-10-CM

## 2016-05-16 DIAGNOSIS — E559 Vitamin D deficiency, unspecified: Secondary | ICD-10-CM

## 2016-05-16 DIAGNOSIS — M3500 Sicca syndrome, unspecified: Secondary | ICD-10-CM | POA: Diagnosis not present

## 2016-05-16 DIAGNOSIS — Z79899 Other long term (current) drug therapy: Secondary | ICD-10-CM | POA: Diagnosis not present

## 2016-05-16 DIAGNOSIS — R768 Other specified abnormal immunological findings in serum: Secondary | ICD-10-CM

## 2016-05-16 DIAGNOSIS — D61818 Other pancytopenia: Secondary | ICD-10-CM

## 2016-05-16 LAB — COMPLETE METABOLIC PANEL WITH GFR
ALBUMIN: 3.2 g/dL — AB (ref 3.6–5.1)
ALK PHOS: 50 U/L (ref 33–130)
ALT: 11 U/L (ref 6–29)
AST: 22 U/L (ref 10–35)
BUN: 15 mg/dL (ref 7–25)
CALCIUM: 9.4 mg/dL (ref 8.6–10.4)
CO2: 24 mmol/L (ref 20–31)
CREATININE: 0.78 mg/dL (ref 0.50–1.05)
Chloride: 105 mmol/L (ref 98–110)
GFR, Est Non African American: 88 mL/min (ref >=60)
Glucose, Bld: 95 mg/dL (ref 65–99)
Potassium: 4 mmol/L (ref 3.5–5.3)
Sodium: 137 mmol/L (ref 135–146)
Total Bilirubin: 0.3 mg/dL (ref 0.2–1.2)
Total Protein: 9.6 g/dL — ABNORMAL HIGH (ref 6.1–8.1)

## 2016-05-16 LAB — CBC WITH DIFFERENTIAL/PLATELET
BASOS PCT: 0 %
Basophils Absolute: 0 cells/uL (ref 0–200)
EOS ABS: 0 {cells}/uL — AB (ref 15–500)
Eosinophils Relative: 0 %
HEMATOCRIT: 34.7 % — AB (ref 35.0–45.0)
HEMOGLOBIN: 11.2 g/dL — AB (ref 11.7–15.5)
LYMPHS ABS: 1008 {cells}/uL (ref 850–3900)
LYMPHS PCT: 42 %
MCH: 27.1 pg (ref 27.0–33.0)
MCHC: 32.3 g/dL (ref 32.0–36.0)
MCV: 84 fL (ref 80.0–100.0)
MONO ABS: 216 {cells}/uL (ref 200–950)
MPV: 10.6 fL (ref 7.5–12.5)
Monocytes Relative: 9 %
NEUTROS PCT: 49 %
Neutro Abs: 1176 cells/uL — ABNORMAL LOW (ref 1500–7800)
Platelets: 130 10*3/uL — ABNORMAL LOW (ref 140–400)
RBC: 4.13 MIL/uL (ref 3.80–5.10)
RDW: 13.9 % (ref 11.0–15.0)
WBC: 2.4 10*3/uL — AB (ref 3.8–10.8)

## 2016-05-16 MED ORDER — HYDROXYCHLOROQUINE SULFATE 200 MG PO TABS: 200.0000 mg | ORAL_TABLET | Freq: Every day | ORAL | 1 refills | Status: AC

## 2016-05-16 MED ORDER — VITAMIN D3 1.25 MG (50000 UT) PO CAPS: 1.0000 | ORAL_CAPSULE | ORAL | 0 refills | Status: AC

## 2016-05-16 NOTE — Progress Notes (Signed)
Office Visit Note  Patient: Megan Reid             Date of Birth: 1966-04-07           MRN: [id number]             PCP: Antony Blackbird, MD Referring: Antony Blackbird, MD Visit Date: 05/16/2016 Occupation: _0 @    Subjective:  Follow-up Follow-up on Sjogren's, elevated sedimentation rate at 130, history of positive ANA, positive Ro, positive LOC, positive rheumatoid factor, severe fatigue,  History of Present Illness: Megan Reid is a 51 y.o. female  Last seen  October 2017 by Dr. Estanislado Pandy.   Follow-up on Sjogren's, elevated sedimentation rate at 130, history of positive ANA, positive Ro, positive LOC, positive rheumatoid factor, severe fatigue,  Patient states that her symptoms have not changed since the last visit. She has ongoing dry eyes and dry mouth.  She saw Dr. Alen Blew for the elevated sedimentation rate, abnormal SPEP, low white count. According to the patient, he is not worried about her abnormal laboratory results. He will continue to monitor her as needed. She has a follow-up appointment coming up in a few months. I reviewed patient's last visit with Dr. Alen Blew and it showed the following from aug 2017 visit ==>  1. Leukocytopenia: Related due to rheumatoid arthritis as well as Plaquenil therapy.    Her CBC today was personally reviewed and showed mild decrease in her white cell count with an absolute neutrophil count of 900. She does not have any history of recurrent infections and periodic monitoring of her white cell count is recommended.    2. Cervical adenopathy: Reactive in nature and could be related to her rheumatoid arthritis. Her workup for malignancy have been unrevealing including lymph node biopsy as well as bone marrow biopsy.   3. Fatigue, weight gain and menstrual irregularity: She could have thyroid disease and she has a physical exam upcoming with her primary care provider and I have recommended checking her thyroid function at that  time.   4. Followup: Will be in 6 months..  Patient had full autoimmune workup in October. She was supposed to get that workup done in June but failed to do so. Today we reviewed all of those values in detail with the patient. Patient states that she did not get any treatment for the low vitamin D so we will start treatment in office today since she still has ongoing fatigue.  Activities of Daily Living:  Patient reports morning stiffness for 30 minutes.   Patient Denies nocturnal pain.  Difficulty dressing/grooming: Denies Difficulty climbing stairs: Denies Difficulty getting out of chair: Denies Difficulty using hands for taps, buttons, cutlery, and/or writing: Denies   Review of Systems  Constitutional: Negative for fatigue.  HENT: Negative for mouth sores and mouth dryness.   Eyes: Negative for dryness.  Respiratory: Negative for shortness of breath.   Gastrointestinal: Negative for constipation and diarrhea.  Musculoskeletal: Negative for myalgias and myalgias.  Skin: Negative for sensitivity to sunlight.  Psychiatric/Behavioral: Negative for decreased concentration and sleep disturbance.    PMFS History:  Patient Active Problem List   Diagnosis Date Noted  . Autoimmune disease ([facility]) 02/14/2016  . Fatigue 02/14/2016  . Arthralgia 02/14/2016  . DDD (degenerative disc disease), lumbar 02/14/2016  . Osteoarthritis of both knees 02/14/2016  . Osteoarthritis of both hands 02/14/2016  . Asthma 02/14/2016  . Migraines 02/14/2016  . Gastritis 02/14/2016  . Vitamin D deficiency 02/14/2016  . Sjogren's disease ([place]) 02/14/2016  .  High risk medication use 02/14/2016    Past Medical History:  Diagnosis Date  . Arthralgia 02/14/2016  . Asthma   . Autoimmune disease ([facility]) 02/14/2016   Positive RF 139 CCP Negative  Positive ANA 1:640 Positive Ro Positive La Elevated ESR 105   . DDD (degenerative disc disease), lumbar 02/14/2016  . Fatigue 02/14/2016  . Gastritis 02/14/2016   . Hot flashes   . Migraines 02/14/2016  . Osteoarthritis of both hands 02/14/2016  . Osteoarthritis of both knees 02/14/2016  . Vitamin D deficiency 02/14/2016    Family History  Problem Relation Age of Onset  . Kidney failure Mother   . Heart failure Mother   . Lupus Mother   . COPD Father   . Hypertension Sister   . Acromegaly Sister    Past Surgical History:  Procedure Laterality Date  . TUBAL LIGATION     Social History   Social History Narrative  . No narrative on file     Objective: Vital Signs: BP (!) 140/91 (BP Location: Left Arm, Patient Position: Sitting, Cuff Size: Normal)   Pulse 81   Resp 12   Ht _0  (1.778 m)   Wt 173 lb (78.5 kg)   LMP 05/22/2012   BMI 24.82 kg/m    Physical Exam  Constitutional: She is oriented to person, place, and time. She appears well-developed and well-nourished.  HENT:  Head: Normocephalic and atraumatic.  Eyes: EOM are normal. Pupils are equal, round, and reactive to light.  Cardiovascular: Normal rate, regular rhythm and normal heart sounds.  Exam reveals no gallop and no friction rub.   No murmur heard. Pulmonary/Chest: Effort normal and breath sounds normal. She has no wheezes. She has no rales.  Abdominal: Soft. Bowel sounds are normal. She exhibits no distension. There is no tenderness. There is no guarding. No hernia.  Musculoskeletal: Normal range of motion. She exhibits no edema, tenderness or deformity.  Lymphadenopathy:    She has no cervical adenopathy.  Neurological: She is alert and oriented to person, place, and time. Coordination normal.  Skin: Skin is warm and dry. Capillary refill takes less than 2 seconds. No rash noted.  Psychiatric: She has a normal mood and affect. Her behavior is normal.  Nursing note and vitals reviewed.    Musculoskeletal Exam:  Full range of motion of all joints Grip strength is equal and strong bilaterally Fiber myalgia tender points are all absent  CDAI Exam: CDAI  Homunculus Exam:   Joint Counts:  CDAI Tender Joint count: 0 CDAI Swollen Joint count: 0  No synovitis on examination.   Investigation: No additional findings.  Orders Only on 02/25/2016  Component Date Value Ref Range Status  . Adequacy 02/25/2016 Satisfactory for evaluation  endocervical/transformation zone component PRESENT.*  Final  . Diagnosis 02/25/2016 ATYPICAL SQUAMOUS CELLS OF UNDETERMINED SIGNIFICANCE (ASC-US).*  Final  . HPV 02/25/2016 DETECTED*  Final  . Material Submitted 02/25/2016 CervicoVaginal Pap [ThinPrep Imaged]*  Final  . CYTOLOGY - PAP 02/25/2016 PAP RESULT   Final-Edited  Office Visit on 02/15/2016  Component Date Value Ref Range Status  . Sodium 02/15/2016 138  135 - 146 mmol/L Final  . Potassium 02/15/2016 4.0  3.5 - 5.3 mmol/L Final  . Chloride 02/15/2016 104  98 - 110 mmol/L Final  . CO2 02/15/2016 27  20 - 31 mmol/L Final  . Glucose, Bld 02/15/2016 86  65 - 99 mg/dL Final  . BUN 02/15/2016 18  7 - 25 mg/dL Final  . Creat  02/15/2016 0.83  0.50 - 1.05 mg/dL Final   Comment:   For patients > or = 52 years of age: The upper reference limit for Creatinine is approximately 13% higher for people identified as African-American.     . Total Bilirubin 02/15/2016 0.2  0.2 - 1.2 mg/dL Final  . Alkaline Phosphatase 02/15/2016 53  33 - 130 U/L Final  . AST 02/15/2016 28  10 - 35 U/L Final  . ALT 02/15/2016 14  6 - 29 U/L Final  . Total Protein 02/15/2016 9.8* 6.1 - 8.1 g/dL Final  . Albumin 02/15/2016 3.7  3.6 - 5.1 g/dL Final  . Calcium 02/15/2016 9.7  8.6 - 10.4 mg/dL Final  . GFR, Est African American 02/15/2016 >89  >=60 mL/min Final  . GFR, Est Non African American 02/15/2016 82  >=60 mL/min Final  . Color, Urine 02/15/2016 YELLOW  YELLOW Final  . APPearance 02/15/2016 CLEAR  CLEAR Final  . Specific Gravity, Urine 02/15/2016 1.027  1.001 - 1.035 Final  . pH 02/15/2016 5.5  5.0 - 8.0 Final  . Glucose, UA 02/15/2016 NEGATIVE  NEGATIVE Final  .  Bilirubin Urine 02/15/2016 NEGATIVE  NEGATIVE Final  . Ketones, ur 02/15/2016 NEGATIVE  NEGATIVE Final  . Hgb urine dipstick 02/15/2016 NEGATIVE  NEGATIVE Final  . Protein, ur 02/15/2016 NEGATIVE  NEGATIVE Final  . Nitrite 02/15/2016 NEGATIVE  NEGATIVE Final  . Leukocytes, UA 02/15/2016 NEGATIVE  NEGATIVE Final  . Vit D, 25-Hydroxy 02/15/2016 24* 30 - 100 ng/mL Final   Comment: Vitamin D Status           25-OH Vitamin D        Deficiency                <20 ng/mL        Insufficiency         20 - 29 ng/mL        Optimal             > or = 30 ng/mL   For 25-OH Vitamin D testing on patients on D2-supplementation and patients for whom quantitation of D2 and D3 fractions is required, the [facility] 25-OH VIT D, (D2,D3), LC/MS/MS is recommended: order code [id number] (patients > 2 yrs).   . Total CK 02/15/2016 117  7 - 177 U/L Final  . TSH 02/15/2016 0.84  mIU/L Final   Comment:   Reference Range   > or = 20 Years  0.40-4.50   Pregnancy Range First trimester  0.26-2.66 Second trimester 0.55-2.73 Third trimester  0.43-2.91     . Anit Nuclear Antibody(ANA) 02/15/2016 POS* NEGATIVE Final  . C3 Complement 02/15/2016 119  90 - 180 mg/dL Final  . C4 Complement 02/15/2016 21  16 - 47 mg/dL Final  . Sed Rate 02/15/2016 130* 0 - 30 mm/hr Final  . Anticardiolipin IgA 02/15/2016 17* APL Final   Comment:                                    Value      Interpretation                                 -------     --------------                               <  or = 11     Negative                                 12 - 20     Indeterminate                                 21 - 80     Low to Medium Positive                                    > 80     High Positive   . Anticardiolipin IgG 02/15/2016 <14  GPL Final   Comment:                                    Value      Interpretation                                 -------     --------------                               < or = 14     Negative                                  15 - 20     Indeterminate                                 21 - 80     Low to Medium Positive                                    > 80     High Positive   . Anticardiolipin IgM 02/15/2016 <12  MPL Final   Comment:                                    Value      Interpretation                                 -------     --------------                               < or = 12     Negative                                 13 - 20     Indeterminate                                 21 - 80  Low to Medium Positive                                    > 80     High Positive The antiphospholipid antibody syndrome (APS) is a clinical-pathologic correlation that includes a clinical event (e.g. thrombosis, pregnancy loss, thrombocytopenia) and persistent positive antiphospholipid antibodies (IgM or IgG ACA >40 MPL/GPL, IgM or IgG anti-b2GPI antibodies or a lupus anticoagulant). The IgA isotype has been implicated in smaller studies, but have not yet been incorporated into the APS criteria. International consensus guidelines suggest waiting at least 12 weeks before retesting to confirm antibody persistence. Reference: J Thromb Haemost 2006: 4; 295   . Lupus Anticoagulant Eval 02/15/2016 REPORT   Final   Comment: A Lupus Anticoagulant is not detected. Reference Range:  Not Detected http://education.questdiagnostics.com/faq/LupusAnticoag ------------------------------------------------------- This interpretation is based on the following test results.   . Beta-2 Glyco I IgG 02/15/2016 <9  <=20 [facility] Final  . Beta-2-Glycoprotein I IgM 02/15/2016 <9  <=20 [facility] Final  . Beta-2-Glycoprotein I IgA 02/15/2016 <9  <=20 [facility] Final   Comment:   The Antiphospholipid Antibody Syndrome (APS) is a clinical-pathologic correlation that includes a clinical event (e.g. thrombosis, pregnancy loss, thrombocytopenia) and persistent positive Antiphospholipid Antibodies (IgM or IgG ACA >40 MPL/GPL,  IgM or IgG anti-B2GPI antibodies, or a Lupus Anticoagulant). The IgA isotype has been implicated in smaller studies, but have not yet been incorporated into the APS criteria. International consensus guidelines suggest waiting at least 12 weeks before retesting to confirm antibody persistence. Reference J Thromb Haemost 2006: 4; 295   For more information on this test, go to http://education.questdiagnostics.com/faq/FAQ109   . Total Protein, Serum Electrophores* 02/15/2016 9.8* 6.1 - 8.1 g/dL Final  . Albumin ELP 02/15/2016 3.7* 3.8 - 4.8 g/dL Final  . Alpha-1-Globulin 02/15/2016 0.3  0.2 - 0.3 g/dL Final  . Alpha-2-Globulin 02/15/2016 0.6  0.5 - 0.9 g/dL Final  . Beta Globulin 02/15/2016 0.7* 0.4 - 0.6 g/dL Final  . Beta 2 02/15/2016 0.6* 0.2 - 0.5 g/dL Final  . Gamma Globulin 02/15/2016 3.9* 0.8 - 1.7 g/dL Final  . Abnormal Protein Band1 02/15/2016 NOT DET  g/dL Final  . SPE Interp. 02/15/2016 SEE NOTE   Final   Comment: A poorly defined band of restricted protein mobility is detected in the gamma globulins. It is unlikely this may represent a monoclonal protein, however, immunofixation analysis is available if clinically indicated. Reviewed by Odis Hollingshead, MD, PhD, FCAP (Electronic Signature on File)   . Abnormal Protein Band2 02/15/2016 NOT DET  g/dL Final  . Abnormal Protein Band3 02/15/2016 NOT DET  g/dL Final  . Rhuematoid fact SerPl-aCnc 02/15/2016 139* <14 IU/mL Final  . SSA (Ro) (ENA) Antibody, IgG 02/15/2016 >8.0 POS* <1.0 NEG AI Final  . SSB (La) (ENA) Antibody, IgG 02/15/2016 >8.0 POS* <1.0 NEG AI Final  . dRVVT Mix Interp. 02/15/2016 REPORT   Final  . dRVVT Screen 02/15/2016 38  <=45 sec Final  . dRVVT 02/15/2016 REPORT   Final  . PTT-LA Screen 02/15/2016 38  <=40 sec Final  . Additional Testing 02/15/2016 REPORT   Final  . ANA Pattern 1 02/15/2016 SPECKLED*  Final  . ANA Titer 1 02/15/2016 >=1:1280* titer Final   Comment:           Reference Range            < 1:40  Negative             1:40-1:80 Low Antibody level           > 1:80      Elevated Antibody level   Abstract on 02/14/2016  Component Date Value Ref Range Status  . Hemoglobin 09/29/2015 12.2  12.0 - 16.0 g/dL Final  . HCT 09/29/2015 35* 36 - 46 % Final  . Platelets 09/29/2015 166  150 - 399 K/L Final  . WBC 09/29/2015 2.1  10^3/mL Final  . Glucose 09/29/2015 92  mg/dL Final  . BUN 09/29/2015 18  4 - 21 mg/dL Final  . Creatinine 09/29/2015 0.8  0.5 - 1.1 mg/dL Final  . Potassium 09/29/2015 4.5  3.4 - 5.3 mmol/L Final  . Sodium 09/29/2015 142  137 - 147 mmol/L Final  . Alkaline Phosphatase 09/29/2015 66  25 - 125 U/L Final  . ALT 09/29/2015 19  7 - 35 U/L Final  . AST 09/29/2015 29  13 - 35 U/L Final  . Bilirubin, Total 09/29/2015 0.2  mg/dL Final  Appointment on 12/08/2015  Component Date Value Ref Range Status  . WBC 12/08/2015 2.0* 3.9 - 10.3 10e3/uL Final  . NEUT# 12/08/2015 0.9* 1.5 - 6.5 10e3/uL Final  . HGB 12/08/2015 11.4* 11.6 - 15.9 g/dL Final  . HCT 12/08/2015 35.5  34.8 - 46.6 % Final  . Platelets 12/08/2015 125* 145 - 400 10e3/uL Final  . MCV 12/08/2015 83.7  79.5 - 101.0 fL Final  . MCH 12/08/2015 26.8  25.1 - 34.0 pg Final  . MCHC 12/08/2015 32.0  31.5 - 36.0 g/dL Final  . RBC 12/08/2015 4.24  3.70 - 5.45 10e6/uL Final  . RDW 12/08/2015 13.4  11.2 - 14.5 % Final  . lymph# 12/08/2015 0.8* 0.9 - 3.3 10e3/uL Final  . MONO# 12/08/2015 0.3  0.1 - 0.9 10e3/uL Final  . Eosinophils Absolute 12/08/2015 0.0  0.0 - 0.5 10e3/uL Final  . Basophils Absolute 12/08/2015 0.0  0.0 - 0.1 10e3/uL Final  . NEUT% 12/08/2015 42.8  38.4 - 76.8 % Final  . LYMPH% 12/08/2015 41.7  14.0 - 49.7 % Final  . MONO% 12/08/2015 13.4  0.0 - 14.0 % Final  . EOS% 12/08/2015 1.0  0.0 - 7.0 % Final  . BASO% 12/08/2015 1.1  0.0 - 2.0 % Final     Imaging: No results found.  Speciality Comments: No specialty comments available.    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[facility]) - +ANA; +Ro, +La, +RF;  High risk medications (not anticoagulants) long-term use - 05/16/2016: Plaquenil 200 mg daily; Plaquenil eye exam normal July 13,2017 ; - Plan: CBC with Differential/Platelet, COMPLETE METABOLIC PANEL WITH GFR, Sedimentation rate, Rheumatoid factor, Protein electrophoresis, serum  Vitamin D deficiency  Abnormal levels of other serum enzymes  Rheumatoid factor positive - Plan: Rheumatoid factor  Abnormal serum protein electrophoresis - Plan: Protein electrophoresis, serum  Other pancytopenia ([facility]) - Plan: hydroxychloroquine (PLAQUENIL) 200 MG tablet    #1: vitamin D deficiency Vitamin D 3, 50,000 units every week, dispense 12 with no refills We discussed using 5000 IUs once a week thereafter if we need to maintain her once she improves.  #2: I discussed in detail her autoimmune workup. For her anti-cardiolipin IgA positive, I've asked her to take enteric-coated aspirin 81 mg daily.  #3: Patient has Ro positive. Need to do baseline EKG.  #  4: We discussed Sjogren's over-the-counter medications including GenTeal ointment at night, Biotene mouth rinse, other modalities. Note patient cannot take pilocarpine because she has a history of asthma.  #5: Today, we will do CBC with differential, CMP with GFR, rheumatoid factor, SPEP to monitor the patient.  #6: High risk prescription. Patient is on Plaquenil 200 mg daily; I will give her 90 day supply with a refill  #7: She has a history of low white count and low platelet count, therefore I do not want to give her higher dose of Plaquenil at this time.  #8: Return to clinic in 4 months  #9: Continue to see hematologist as scheduled.  #10: Patient reports at our office today that she has learned new family history for her mother and her sister. Her sister was diagnosed with gigantism and had surgery done. She does  not recall exactly what was surgically removed but she stated that the surgeon had to go through her nose to enter her brain and removed something. She also states that her sister way to wear size 13 shoes  She also states that her mom had lupus and they learned that the lupus was the cause of the failed kidney.  Orders: Orders Placed This Encounter  Procedures  . CBC with Differential/Platelet  . COMPLETE METABOLIC PANEL WITH GFR  . Sedimentation rate  . Rheumatoid factor  . Protein electrophoresis, serum   Meds ordered this encounter  Medications  . Cholecalciferol (VITAMIN D3) 50000 units CAPS    Sig: Take 1 capsule by mouth once a week.    Dispense:  12 capsule    Refill:  0    Order Specific Question:   Supervising Provider    Answer:   Bo Merino [[id number]]  . hydroxychloroquine (PLAQUENIL) 200 MG tablet    Sig: Take 1 tablet (200 mg total) by mouth daily.    Dispense:  90 tablet    Refill:  1    Order Specific Question:   Supervising Provider    Answer:   Bo Merino [phone]    Face-to-face time spent with patient was 50 minutes. 50% of time was spent in counseling and coordination of care.  Follow-Up Instructions: Return in about 4 months (around 09/13/2016) for SJOGREN'S;FATIGUE,VIT D DEF, PLQ 200QD.   Eliezer Lofts, PA-C I examined and evaluated the patient with Eliezer Lofts PA.Patient was asymptomatic and had no synovitis on examination today. She has no active disease. The plan of care was discussed as noted above.  Bo Merino, MD Note - This record has been created using Editor, commissioning.  Chart creation errors have been sought, but may not always  have been located. Such creation errors do not reflect on  the standard of medical care.

## 2016-05-17 LAB — SEDIMENTATION RATE: SED RATE: 120 mm/h — AB (ref 0–30)

## 2016-05-17 LAB — RHEUMATOID FACTOR: RHEUMATOID FACTOR: 133 [IU]/mL — AB (ref <14)

## 2016-05-18 LAB — PROTEIN ELECTROPHORESIS, SERUM
Albumin ELP: 3.7 g/dL — ABNORMAL LOW (ref 3.8–4.8)
Alpha-1-Globulin: 0.3 g/dL (ref 0.2–0.3)
Alpha-2-Globulin: 0.6 g/dL (ref 0.5–0.9)
BETA 2: 0.6 g/dL — AB (ref 0.2–0.5)
BETA GLOBULIN: 0.7 g/dL — AB (ref 0.4–0.6)
GAMMA GLOBULIN: 3.8 g/dL — AB (ref 0.8–1.7)
Total Protein, Serum Electrophoresis: 9.6 g/dL — ABNORMAL HIGH (ref 6.1–8.1)

## 2016-05-19 ENCOUNTER — Telehealth: Payer: Self-pay | Admitting: Radiology

## 2016-05-19 NOTE — Telephone Encounter (Signed)
Have received fax from [facility], they have sent orders for sleep study, returned their fax to advise this should be cancelled, as patient has had already, with [facility]

## 2016-05-20 ENCOUNTER — Telehealth: Payer: Self-pay

## 2016-05-20 NOTE — Telephone Encounter (Signed)
Spoke with patient a d rescheduled her appt due to pros. Clinic   Megan Reid

## 2016-05-29 ENCOUNTER — Encounter: Payer: Self-pay | Admitting: *Deleted

## 2016-06-13 ENCOUNTER — Other Ambulatory Visit: Payer: BC Managed Care – PPO

## 2016-06-13 ENCOUNTER — Ambulatory Visit: Payer: BC Managed Care – PPO | Admitting: Oncology

## 2016-06-21 ENCOUNTER — Telehealth: Payer: Self-pay | Admitting: Oncology

## 2016-06-21 ENCOUNTER — Ambulatory Visit (HOSPITAL_BASED_OUTPATIENT_CLINIC_OR_DEPARTMENT_OTHER): Payer: BC Managed Care – PPO | Admitting: Oncology

## 2016-06-21 ENCOUNTER — Other Ambulatory Visit (HOSPITAL_BASED_OUTPATIENT_CLINIC_OR_DEPARTMENT_OTHER): Payer: BC Managed Care – PPO

## 2016-06-21 VITALS — BP 151/84 | HR 78 | Temp 98.0°F | Resp 18 | Ht 70.0 in | Wt 170.6 lb

## 2016-06-21 DIAGNOSIS — R700 Elevated erythrocyte sedimentation rate: Secondary | ICD-10-CM | POA: Diagnosis not present

## 2016-06-21 DIAGNOSIS — D72821 Monocytosis (symptomatic): Secondary | ICD-10-CM

## 2016-06-21 DIAGNOSIS — D696 Thrombocytopenia, unspecified: Secondary | ICD-10-CM | POA: Diagnosis not present

## 2016-06-21 DIAGNOSIS — D709 Neutropenia, unspecified: Secondary | ICD-10-CM

## 2016-06-21 DIAGNOSIS — D649 Anemia, unspecified: Secondary | ICD-10-CM

## 2016-06-21 DIAGNOSIS — D72819 Decreased white blood cell count, unspecified: Secondary | ICD-10-CM

## 2016-06-21 DIAGNOSIS — M069 Rheumatoid arthritis, unspecified: Secondary | ICD-10-CM

## 2016-06-21 DIAGNOSIS — D61818 Other pancytopenia: Secondary | ICD-10-CM

## 2016-06-21 LAB — CBC WITH DIFFERENTIAL/PLATELET
BASO%: 0.7 % (ref 0.0–2.0)
BASOS ABS: 0 10*3/uL (ref 0.0–0.1)
EOS ABS: 0 10*3/uL (ref 0.0–0.5)
EOS%: 1.1 % (ref 0.0–7.0)
HEMATOCRIT: 33.1 % — AB (ref 34.8–46.6)
HEMOGLOBIN: 10.8 g/dL — AB (ref 11.6–15.9)
LYMPH%: 36.3 % (ref 14.0–49.7)
MCH: 27.3 pg (ref 25.1–34.0)
MCHC: 32.5 g/dL (ref 31.5–36.0)
MCV: 84 fL (ref 79.5–101.0)
MONO#: 0.3 10*3/uL (ref 0.1–0.9)
MONO%: 12.1 % (ref 0.0–14.0)
NEUT#: 1.4 10*3/uL — ABNORMAL LOW (ref 1.5–6.5)
NEUT%: 49.8 % (ref 38.4–76.8)
PLATELETS: 116 10*3/uL — AB (ref 145–400)
RBC: 3.94 10*6/uL (ref 3.70–5.45)
RDW: 14.3 % (ref 11.2–14.5)
WBC: 2.7 10*3/uL — ABNORMAL LOW (ref 3.9–10.3)
lymph#: 1 10*3/uL (ref 0.9–3.3)

## 2016-06-21 NOTE — Telephone Encounter (Signed)
Appointments scheduled per 06/21/16 los. Patient was given a copy of the AVS report and appointment schedule per 06/21/16 los. °

## 2016-06-21 NOTE — Progress Notes (Signed)
Hematology and Oncology Follow Up Visit  Megan Reid [id number] [birth year] 51 y.o. 06/21/2016 3:36 PM   Principle Diagnosis: 51 year old woman with leukocytopenia, monocytosis. These findings are reactive related to her autoimmune arthritis.  Prior Therapy: She is status post a bone marrow biopsy which did not show any evidence of dysplasia. PET CT scan in October 2015 did not show any evidence of lymphadenopathy.  Current therapy: Plaquenil for rheumatoid arthritis started in July 2016 under the care of Dr. Estanislado Pandy.    Interim History:  Megan Reid presents today for a follow up visit. Since the last visit, she reports feeling reasonably well without any major changes in her health. She does report some minor fatigue but no major debilitation or constitutional symptoms. She denied any fevers, chills or sweats. She denied any weight loss. She continues to attend activities of daily living including working full time.  She continued on Plaquenil which she has tolerated well and controlled her arthritis. She denied any worsening cytopenias associated with it. She denied any opportunistic infections or hospitalizations.     She did not report any headaches or blurry vision or syncope. She does not report any chest pain or palpitation. She does not report any cough. She is not reporting any nausea, vomiting, abdominal pain, hematochezia, melena. She did not report any frequency urgency or hesitancy. She does not report any petechiae or easy bruising. Rest of her review of systems unremarkable.  Medications: I have reviewed the patient's current medications.   Current Outpatient Prescriptions  Medication Sig Dispense Refill  . albuterol (PROVENTIL HFA;VENTOLIN HFA) 108 (90 BASE) MCG/ACT inhaler Inhale 2 puffs into the lungs 2 (two) times daily. For shortness of breath    . Cholecalciferol (VITAMIN D3) 50000 units CAPS Take 1 capsule by mouth once a week. 12 capsule 0  . hydroxychloroquine  (PLAQUENIL) 200 MG tablet Take 1 tablet (200 mg total) by mouth daily. 90 tablet 1  . montelukast (SINGULAIR) 10 MG tablet Take 10 mg by mouth every evening.      No current facility-administered medications for this visit.     Allergies:  Allergies  Allergen Reactions  . Strawberry Extract Other (See Comments)    Unknown reaction  . Sulfa Antibiotics Anaphylaxis, Hives and Swelling    Swelling of the face and tongue     Past Medical History, Surgical history, Social history, and Family History were reviewed and updated.   Physical Exam: Blood pressure (!) 151/84, pulse 78, temperature 98 F (36.7 C), temperature source Oral, resp. rate 18, height 5' 10" (1.778 m), weight 170 lb 9.6 oz (77.4 kg), last menstrual period 05/22/2012, SpO2 100 %. ECOG: 1 General appearance: Alert, awake woman appeared without distress. Head: Normocephalic, without obvious abnormality no oral ulcers or lesions. Neck: No adenopathy palpated today's exam. Lymph nodes: No lymphadenopathy noted. Heart:regular rate and rhythm, S1, S2 normal, no murmur, click, rub or gallop Lung:chest clear, expiratory wheezes noted. Abdomin: soft, non-tender, without masses or organomegaly no rebound or guarding. EXT:no erythema, induration, or nodules   Lab Results: Lab Results  Component Value Date   WBC 2.7 (L) 06/21/2016   HGB 10.8 (L) 06/21/2016   HCT 33.1 (L) 06/21/2016   MCV 84.0 06/21/2016   PLT 116 (L) 06/21/2016     Chemistry      Component Value Date/Time   NA 137 05/16/2016 1614   NA 142 09/29/2015   NA 139 04/13/2015 1323   K 4.0 05/16/2016 1614   K 4.2 04/13/2015  1323   CL 105 05/16/2016 1614   CL 104 03/23/2013 0101   CL 105 01/11/2012 1349   CO2 24 05/16/2016 1614   CO2 26 04/13/2015 1323   BUN 15 05/16/2016 1614   BUN 18 09/29/2015   BUN 15.9 04/13/2015 1323   CREATININE 0.78 05/16/2016 1614   CREATININE 0.9 04/13/2015 1323   GLU 92 09/29/2015      Component Value Date/Time    CALCIUM 9.4 05/16/2016 1614   CALCIUM 9.7 04/13/2015 1323   ALKPHOS 50 05/16/2016 1614   ALKPHOS 65 04/13/2015 1323   AST 22 05/16/2016 1614   AST 26 04/13/2015 1323   ALT 11 05/16/2016 1614   ALT 13 04/13/2015 1323   BILITOT 0.3 05/16/2016 1614   BILITOT 0.30 04/13/2015 1323        Impression and Plan:  51 year old woman with the following issues:   1. Leukocytopenia: Related due to rheumatoid arthritis as well as Plaquenil therapy.   Her CBC From 06/21/2016 showed her white cell count to be stable with an absolute neutrophil count of 1400.   2. Mild anemia and thrombocytopenia: Likely reactive in nature without any evidence to suggest hematological abnormalities.  3. Elevated sedimentation rate: I do not see any definitive hematological disorder to explain this finding. He has no evidence to suggest plasma cell disorder without any M spike. A bone marrow biopsy obtained in 2015 was unremarkable. This is likely related to a chronic inflammatory process such as rheumatoid arthritis.  4. Followup: Will be in 6 months.Zola Button, MD 3/7/20183:36 PM

## 2016-09-01 ENCOUNTER — Telehealth: Payer: Self-pay | Admitting: *Deleted

## 2016-09-01 DIAGNOSIS — D72819 Decreased white blood cell count, unspecified: Secondary | ICD-10-CM | POA: Insufficient documentation

## 2016-09-01 DIAGNOSIS — R768 Other specified abnormal immunological findings in serum: Secondary | ICD-10-CM | POA: Insufficient documentation

## 2016-09-01 NOTE — Telephone Encounter (Signed)
Attempted to contact patient and left message for patient to call the office.  

## 2016-09-01 NOTE — Progress Notes (Signed)
Office Visit Note  Patient: Megan Reid             Date of Birth: 04-17-1966           MRN: [id number]             PCP: Antony Blackbird, MD Referring: Antony Blackbird, MD Visit Date: 09/14/2016 Occupation: '@GUAROCC'$ @    Subjective:  Pain and swelling in bilateral hands   History of Present Illness: Megan Reid is a 51 y.o. female with history of Sjogren's syndrome and osteoarthritis. She states she's been having increased pain and swelling in bilateral hands. 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 She's also having increased tingling and numbness in her bilateral feet. None of the other joints are painful. She's been having increased lower back pain and has difficulty climbing stairs. She gives history of recurrent urinary tract infections and believes that she has a UTI now. Her PCP has retired and she would prefer first to check her urine today.  Activities of Daily Living:  Patient reports morning stiffness for 5-7 minutes.   Patient Reports nocturnal pain.  Difficulty dressing/grooming: Denies Difficulty climbing stairs: Reports Difficulty getting out of chair: Reports Difficulty using hands for taps, buttons, cutlery, and/or writing: Reports   Review of Systems  Constitutional: Positive for fatigue. Negative for night sweats, weight gain, weight loss and weakness.  HENT: Positive for mouth dryness. Negative for mouth sores, trouble swallowing, trouble swallowing and nose dryness.   Eyes: Positive for dryness. Negative for pain, redness and visual disturbance.  Respiratory: Positive for shortness of breath. Negative for cough and difficulty breathing.        On exertion  Cardiovascular: Negative for chest pain, palpitations, hypertension, irregular heartbeat and swelling in legs/feet.  Gastrointestinal: Positive for constipation. Negative for blood in stool and diarrhea.  Endocrine: Negative for increased urination.  Genitourinary: Negative for  vaginal dryness.  Musculoskeletal: Positive for arthralgias, joint pain, joint swelling and morning stiffness. Negative for myalgias, muscle weakness, muscle tenderness and myalgias.  Skin: Negative for color change, rash, hair loss, skin tightness, ulcers and sensitivity to sunlight.  Allergic/Immunologic: Negative for susceptible to infections.  Neurological: Negative for dizziness, memory loss and night sweats.  Hematological: Negative for swollen glands.  Psychiatric/Behavioral: Negative for depressed mood and sleep disturbance. The patient is not nervous/anxious.     PMFS History:  Patient Active Problem List   Diagnosis Date Noted  . Rheumatoid factor positive 09/01/2016  . Leucopenia 09/01/2016  . Autoimmune disease ([place]) 02/14/2016  . Fatigue 02/14/2016  . Arthralgia 02/14/2016  . DDD (degenerative disc disease), lumbar 02/14/2016  . Osteoarthritis of both knees 02/14/2016  . Osteoarthritis of both hands 02/14/2016  . Asthma 02/14/2016  . Migraines 02/14/2016  . Gastritis 02/14/2016  . Vitamin D deficiency 02/14/2016  . Sjogren's disease ([place]) 02/14/2016  . High risk medication use 02/14/2016    Past Medical History:  Diagnosis Date  . Arthralgia 02/14/2016  . Asthma   . Autoimmune disease ([place]) 02/14/2016   Positive RF 139 CCP Negative  Positive ANA 1:640 Positive Ro Positive La Elevated ESR 105   . DDD (degenerative disc disease), lumbar 02/14/2016  . Fatigue 02/14/2016  . Gastritis 02/14/2016  . Hot flashes   . Migraines 02/14/2016  . Osteoarthritis of both hands 02/14/2016  . Osteoarthritis of both knees 02/14/2016  . Vitamin D deficiency 02/14/2016    Family History  Problem Relation Age of Onset  . Kidney failure Mother   .  Heart failure Mother   . Lupus Mother   . COPD Father   . Hypertension Sister   . Acromegaly Sister    Past Surgical History:  Procedure Laterality Date  . TUBAL LIGATION     Social History   Social History Narrative  . No  narrative on file     Objective: Vital Signs: BP 131/81 (BP Location: Left Arm, Patient Position: Sitting, Cuff Size: Normal)   Pulse 73   Resp 15   Ht '5\' 10"'$  (1.778 m)   Wt 175 lb (79.4 kg)   LMP 05/22/2012   BMI 25.11 kg/m    Physical Exam  Constitutional: She is oriented to person, place, and time. She appears well-developed and well-nourished.  HENT:  Head: Normocephalic and atraumatic.  Bilateral Parotid swelling  Eyes: Conjunctivae and EOM are normal.  Neck: Normal range of motion.  Cardiovascular: Normal rate, regular rhythm, normal heart sounds and intact distal pulses.   Pulmonary/Chest: Effort normal and breath sounds normal.  Abdominal: Soft. Bowel sounds are normal.  Lymphadenopathy:    She has no cervical adenopathy.  Neurological: She is alert and oriented to person, place, and time.  Skin: Skin is warm and dry. Capillary refill takes less than 2 seconds.  Psychiatric: She has a normal mood and affect. Her behavior is normal.  Nursing note and vitals reviewed.    Musculoskeletal Exam: C-spine and thoracic lumbar spine good range of motion shoulder joints although joints wrist joints are good range of motion she has some synovitis on her PIP joints. Hip joints knee joints ankles MTPs PIPs with good range of motion with no synovitis.  CDAI Exam: CDAI Homunculus Exam:   Tenderness:  Right hand: 2nd PIP and 3rd PIP  Swelling:  Right hand: 2nd PIP and 3rd PIP  Joint Counts:  CDAI Tender Joint count: 2 CDAI Swollen Joint count: 2  Global Assessments:  Patient Global Assessment: 4 Provider Global Assessment: 4  CDAI Calculated Score: 12    Investigation: No additional findings. CBC Latest Ref Rng & Units 06/21/2016 05/16/2016 12/08/2015  WBC 3.9 - 10.3 10e3/uL 2.7(L) 2.4(L) 2.0(L)  Hemoglobin 11.6 - 15.9 g/dL 10.8(L) 11.2(L) 11.4(L)  Hematocrit 34.8 - 46.6 % 33.1(L) 34.7(L) 35.5  Platelets 145 - 400 10e3/uL 116(L) 130(L) 125(L)   CMP Latest Ref Rng &  Units 05/16/2016 02/15/2016 09/29/2015  Glucose 65 - 99 mg/dL 95 86 -  BUN 7 - 25 mg/dL '15 18 18  '$ Creatinine 0.50 - 1.05 mg/dL 0.78 0.83 0.8  Sodium 135 - 146 mmol/L 137 138 142  Potassium 3.5 - 5.3 mmol/L 4.0 4.0 4.5  Chloride 98 - 110 mmol/L 105 104 -  CO2 20 - 31 mmol/L 24 27 -  Calcium 8.6 - 10.4 mg/dL 9.4 9.7 -  Total Protein 6.1 - 8.1 g/dL 9.6(H) 9.8(H) -  Total Bilirubin 0.2 - 1.2 mg/dL 0.3 0.2 -  Alkaline Phos 33 - 130 U/L 50 53 66  AST 10 - 35 U/L '22 28 29  '$ ALT 6 - 29 U/L '11 14 19  '$ 05/16/2016 SPEP negative, ESR 120, RF 133 Imaging: No results found.  Speciality Comments: No specialty comments available.    Procedures:  No procedures performed Allergies: Strawberry extract and Sulfa antibiotics   Assessment / Plan:     Visit Diagnoses: 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HCC) - Positive ANA, positive Ro, positive La,, positive RF, sicca symptoms, history of parotitis, lymphadenopathy, fatigue, neutropenia, thrombocytopenia. She continues to have sicca symptoms and parotid swelling.  She's been experiencing increased fatigue recently.  Parotid swelling: She has seen her ENT frequently in the past. I've advised her to make another appointment and see if she can get parotid biopsy.  Elevated sedimentation rate: She has had very high sedimentation rate for several years and oncology workup has been negative in the past.  Rheumatoid factor positive: She has synovitis in her hands today. She states she's been having increased swelling in her hands recently. She might be having inflammatory arthritis due to underlying Sjogren's are due to rheumatoid arthritis. I'll check the following labs today. We also had detailed discussion regarding different treatment options and side effects. Short course of prednisone taper will help her as she is having a lot of stiffness in her hands. We also had detailed discussion regarding methotrexate. I'm hesitant to start her on the  medication due to neutropenia, cytopenia. Indications side effects contraindications were discussed at length. We will obtain following labs before starting immunosuppressive therapy. My plan will be to start her on methotrexate 3 tablets by mouth every week and stay on the same dose along with folic acid 1 mg by mouth daily after the labs are available. When she starts methotrexate we will have to follow her labs very closely due to pancytopenia. I plan to check labs every 2 weeks and if stable then every month.  High risk medication use - Plaquenil 200 mg by mouth daily. Labs have been stable with neutropenia, anemia and thrombocytopenia.  She's having increased shortness of breath. I will schedule high-resolution CT to evaluate this further.  I had detailed discussion with the patient and she is quite concerned about her chronic illness. I will also make a referral [facility]/ [facility] for evaluation and treatment plan.  Primary osteoarthritis of both knees: Chronic discomfort  Primary osteoarthritis of both hands: She is some stiffness  DDD (degenerative disc disease), lumbar: She complains of increased lower back pain and difficulty climbing stairs.  Dysuria I will check UA and urine culture today.  Neutropenia, thrombocytopenia, elevated ESR - ESR 120 to130, evaluated by hematology Dr. Osker Mason  Other fatigue: She complains of increased fatigue .Vitamin D deficiency  History of migraine  History of asthma    Orders: Orders Placed This Encounter  Procedures  . Urine Culture  . CT Chest High Resolution  . Urinalysis, Routine w reflex microscopic  . CBC with Differential/Platelet  . COMPLETE METABOLIC PANEL WITH GFR  . Sedimentation rate  . Rheumatoid factor  . Cyclic citrul peptide antibody, IgG  . Hepatitis panel, acute  . IgG, IgA, IgM  . Quantiferon tb gold assay (blood)  . VITAMIN D 25 Hydroxy (Vit-D Deficiency, Fractures)  . HIV antibody  . Ambulatory referral to Rheumatology    Meds ordered this encounter  Medications  . predniSONE (DELTASONE) 5 MG tablet    Sig: 4 tabs po x 3 days, 3 tabs po x days, 2 tabs po x 3 days, 1 tab po x 3 days, 1/2 tab po x 3 days    Dispense:  32 tablet    Refill:  0    Face-to-face time spent with patient was 40 minutes. 50% of time was spent in counseling and coordination of care.  Follow-Up Instructions: Return in about 4 weeks (around 10/12/2016) for Sjogren's, Osteoarthritis.   Bo Merino, MD  Note - This record has been created using Editor, commissioning.  Chart creation errors have been sought, but may not always  have been located. Such creation errors do not  reflect on  the standard of medical care.

## 2016-09-07 NOTE — Telephone Encounter (Signed)
Spoke with patient regarding results received in office. Patient states she has had a name change and she is now [employer]. Advise patient we just wanted to verify to make sure.

## 2016-09-14 ENCOUNTER — Ambulatory Visit (INDEPENDENT_AMBULATORY_CARE_PROVIDER_SITE_OTHER): Payer: BC Managed Care – PPO | Admitting: Rheumatology

## 2016-09-14 ENCOUNTER — Encounter: Payer: Self-pay | Admitting: Rheumatology

## 2016-09-14 VITALS — BP 131/81 | HR 73 | Resp 15 | Ht 70.0 in | Wt 175.0 lb

## 2016-09-14 DIAGNOSIS — M19042 Primary osteoarthritis, left hand: Secondary | ICD-10-CM

## 2016-09-14 DIAGNOSIS — D72818 Other decreased white blood cell count: Secondary | ICD-10-CM

## 2016-09-14 DIAGNOSIS — R768 Other specified abnormal immunological findings in serum: Secondary | ICD-10-CM

## 2016-09-14 DIAGNOSIS — R5383 Other fatigue: Secondary | ICD-10-CM

## 2016-09-14 DIAGNOSIS — Z8709 Personal history of other diseases of the respiratory system: Secondary | ICD-10-CM

## 2016-09-14 DIAGNOSIS — M170 Bilateral primary osteoarthritis of knee: Secondary | ICD-10-CM

## 2016-09-14 DIAGNOSIS — M5136 Other intervertebral disc degeneration, lumbar region: Secondary | ICD-10-CM

## 2016-09-14 DIAGNOSIS — Z79899 Other long term (current) drug therapy: Secondary | ICD-10-CM

## 2016-09-14 DIAGNOSIS — Z8669 Personal history of other diseases of the nervous system and sense organs: Secondary | ICD-10-CM

## 2016-09-14 DIAGNOSIS — M3501 Sicca syndrome with keratoconjunctivitis: Secondary | ICD-10-CM

## 2016-09-14 DIAGNOSIS — E559 Vitamin D deficiency, unspecified: Secondary | ICD-10-CM | POA: Diagnosis not present

## 2016-09-14 DIAGNOSIS — R399 Unspecified symptoms and signs involving the genitourinary system: Secondary | ICD-10-CM

## 2016-09-14 DIAGNOSIS — M19041 Primary osteoarthritis, right hand: Secondary | ICD-10-CM

## 2016-09-14 LAB — VITAMIN D 25 HYDROXY (VIT D DEFICIENCY, FRACTURES): VIT D 25 HYDROXY: 66 ng/mL (ref 30–100)

## 2016-09-14 MED ORDER — PREDNISONE 5 MG PO TABS: ORAL_TABLET | ORAL | 0 refills | Status: DC

## 2016-09-14 NOTE — Progress Notes (Signed)
Pharmacy Note  Subjective: Patient presents today to the [facility] to see Dr. Corliss Skains.  Patient seen by the pharmacist for counseling on methotrexate.    Objective: CBC    Component Value Date/Time   WBC 2.7 (L) 06/21/2016 1513   WBC 2.4 (L) 05/16/2016 1614   RBC 3.94 06/21/2016 1513   RBC 4.13 05/16/2016 1614   HGB 10.8 (L) 06/21/2016 1513   HCT 33.1 (L) 06/21/2016 1513   PLT 116 (L) 06/21/2016 1513   MCV 84.0 06/21/2016 1513   MCH 27.3 06/21/2016 1513   MCH 27.1 05/16/2016 1614   MCHC 32.5 06/21/2016 1513   MCHC 32.3 05/16/2016 1614   RDW 14.3 06/21/2016 1513   LYMPHSABS 1.0 06/21/2016 1513   MONOABS 0.3 06/21/2016 1513   EOSABS 0.0 06/21/2016 1513   BASOSABS 0.0 06/21/2016 1513   CMP     Component Value Date/Time   NA 137 05/16/2016 1614   NA 142 09/29/2015   NA 139 04/13/2015 1323   K 4.0 05/16/2016 1614   K 4.2 04/13/2015 1323   CL 105 05/16/2016 1614   CL 104 03/23/2013 0101   CL 105 01/11/2012 1349   CO2 24 05/16/2016 1614   CO2 26 04/13/2015 1323   GLUCOSE 95 05/16/2016 1614   GLUCOSE 87 04/13/2015 1323   GLUCOSE 76 01/11/2012 1349   BUN 15 05/16/2016 1614   BUN 18 09/29/2015   BUN 15.9 04/13/2015 1323   CREATININE 0.78 05/16/2016 1614   CREATININE 0.9 04/13/2015 1323   CALCIUM 9.4 05/16/2016 1614   CALCIUM 9.7 04/13/2015 1323   PROT 9.6 (H) 05/16/2016 1614   PROT 9.6 (H) 04/13/2015 1323   ALBUMIN 3.2 (L) 05/16/2016 1614   ALBUMIN 3.2 (L) 04/13/2015 1323   AST 22 05/16/2016 1614   AST 26 04/13/2015 1323   ALT 11 05/16/2016 1614   ALT 13 04/13/2015 1323   ALKPHOS 50 05/16/2016 1614   ALKPHOS 65 04/13/2015 1323   BILITOT 0.3 05/16/2016 1614   BILITOT 0.30 04/13/2015 1323   GFRNONAA 88 05/16/2016 1614   GFRAA >89 05/16/2016 1614   TB Gold: ordered today Hepatitis panel: ordered today HIV: ordered today  Chest-xray:  CT ordered today  Contraception: post-menopause  Alcohol use: None  Assessment/Plan:  Plan is to  initiate patient on methotrexate 3 tablets by mouth weekly after lab results return.  Patient was counseled on the purpose, proper use, and adverse effects of methotrexate including nausea, infection, and signs and symptoms of pneumonitis.  Reviewed instructions with patient to take methotrexate weekly along with folic acid 1 mg daily.  Discussed the importance of frequent monitoring of kidney and liver function and blood counts, and provided patient with standing lab instructions.  Counseled patient to avoid NSAIDs and alcohol while on methotrexate.  Provided patient with educational materials on methotrexate and answered all questions.  Advised patient to get annual influenza vaccine and to get a pneumococcal vaccine if patient has not already had one.  Patient voiced understanding.  Patient consented to methotrexate use.  Will upload into chart.    Patient was prescribed prednisone taper.  I counseled patient on purpose, proper use, and adverse effects of prednisone.  Advised her to take the medication first thing in the morning with food.  Patient voiced understanding and denies any questions or concerns at this time.   Lilla Shook, Pharm.D., BCPS Clinical Pharmacist Pager: [phone] Phone: [phone] 09/14/2016 5:06 PM

## 2016-09-14 NOTE — Patient Instructions (Addendum)
Please contact your ENT for parotid gland biopsy.  Once we get your lab results, we plan to start methotrexate 3 tablets by mouth weekly and folic acid 1 mg by mouth daily  Standing Labs We placed an order today for your standing lab work.    Please come back and get your standing labs in 2 weeks x2 then every month  We have open lab Monday through Friday from 8:30-11:30 AM and 1:30-4 PM at the office of Dr. Arbutus Ped, PA.   The office is located at [address], [place], [place] [address] No appointment is necessary.   Labs are drawn by [employer].  You may receive a bill from [facility] for your lab work. If you have any questions regarding directions or hours of operation,  please call [phone].    Methotrexate tablets What is this medicine? METHOTREXATE (METH oh TREX ate) is a chemotherapy drug used to treat cancer including breast cancer, leukemia, and lymphoma. This medicine can also be used to treat psoriasis and certain kinds of arthritis. This medicine may be used for other purposes; ask your health care provider or pharmacist if you have questions. COMMON BRAND NAME(S): Rheumatrex, Trexall What should I tell my health care provider before I take this medicine? They need to know if you have any of these conditions: -fluid in the stomach area or lungs -if you often drink alcohol -infection or immune system problems -kidney disease or on hemodialysis -liver disease -low blood counts, like low white cell, platelet, or red cell counts -lung disease -radiation therapy -stomach ulcers -ulcerative colitis -an unusual or allergic reaction to methotrexate, other medicines, foods, dyes, or preservatives -pregnant or trying to get pregnant -breast-feeding How should I use this medicine? Take this medicine by mouth with a glass of water. Follow the directions on the prescription label. Take your medicine at regular intervals. Do not take it more often  than directed. Do not stop taking except on your doctor's advice. Make sure you know why you are taking this medicine and how often you should take it. If this medicine is used for a condition that is not cancer, like arthritis or psoriasis, it should be taken weekly, NOT daily. Taking this medicine more often than directed can cause serious side effects, even death. Talk to your healthcare provider about safe handling and disposal of this medicine. You may need to take special precautions. Talk to your pediatrician regarding the use of this medicine in children. While this drug may be prescribed for selected conditions, precautions do apply. Overdosage: If you think you have taken too much of this medicine contact a poison control center or emergency room at once. NOTE: This medicine is only for you. Do not share this medicine with others. What if I miss a dose? If you miss a dose, talk with your doctor or health care professional. Do not take double or extra doses. What may interact with this medicine? This medicine may interact with the following medication: -acitretin -aspirin and aspirin-like medicines including salicylates -azathioprine -certain antibiotics like penicillins, tetracycline, and chloramphenicol -cyclosporine -gold -hydroxychloroquine -live virus vaccines -NSAIDs, medicines for pain and inflammation, like ibuprofen or naproxen -other cytotoxic agents -penicillamine -phenylbutazone -phenytoin -probenecid -retinoids such as isotretinoin and tretinoin -steroid medicines like prednisone or cortisone -sulfonamides like sulfasalazine and trimethoprim/sulfamethoxazole -theophylline This list may not describe all possible interactions. Give your health care provider a list of all the medicines, herbs, non-prescription drugs, or dietary supplements you use. Also tell  them if you smoke, drink alcohol, or use illegal drugs. Some items may interact with your medicine. What should  I watch for while using this medicine? Avoid alcoholic drinks. This medicine can make you more sensitive to the sun. Keep out of the sun. If you cannot avoid being in the sun, wear protective clothing and use sunscreen. Do not use sun lamps or tanning beds/booths. You may need blood work done while you are taking this medicine. Call your doctor or health care professional for advice if you get a fever, chills or sore throat, or other symptoms of a cold or flu. Do not treat yourself. This drug decreases your body's ability to fight infections. Try to avoid being around people who are sick. This medicine may increase your risk to bruise or bleed. Call your doctor or health care professional if you notice any unusual bleeding. Check with your doctor or health care professional if you get an attack of severe diarrhea, nausea and vomiting, or if you sweat a lot. The loss of too much body fluid can make it dangerous for you to take this medicine. Talk to your doctor about your risk of cancer. You may be more at risk for certain types of cancers if you take this medicine. Both men and women must use effective birth control with this medicine. Do not become pregnant while taking this medicine or until at least 1 normal menstrual cycle has occurred after stopping it. Women should inform their doctor if they wish to become pregnant or think they might be pregnant. Men should not father a child while taking this medicine and for 3 months after stopping it. There is a potential for serious side effects to an unborn child. Talk to your health care professional or pharmacist for more information. Do not breast-feed an infant while taking this medicine. What side effects may I notice from receiving this medicine? Side effects that you should report to your doctor or health care professional as soon as possible: -allergic reactions like skin rash, itching or hives, swelling of the face, lips, or tongue -breathing  problems or shortness of breath -diarrhea -dry, nonproductive cough -low blood counts - this medicine may decrease the number of white blood cells, red blood cells and platelets. You may be at increased risk for infections and bleeding. -mouth sores -redness, blistering, peeling or loosening of the skin, including inside the mouth -signs of infection - fever or chills, cough, sore throat, pain or trouble passing urine -signs and symptoms of bleeding such as bloody or black, tarry stools; red or dark-brown urine; spitting up blood or brown material that looks like coffee grounds; red spots on the skin; unusual bruising or bleeding from the eye, gums, or nose -signs and symptoms of kidney injury like trouble passing urine or change in the amount of urine -signs and symptoms of liver injury like dark yellow or brown urine; general ill feeling or flu-like symptoms; light-colored stools; loss of appetite; nausea; right upper belly pain; unusually weak or tired; yellowing of the eyes or skin Side effects that usually do not require medical attention (report to your doctor or health care professional if they continue or are bothersome): -dizziness -hair loss -tiredness -upset stomach -vomiting This list may not describe all possible side effects. Call your doctor for medical advice about side effects. You may report side effects to FDA at 1-800-FDA-1088. Where should I keep my medicine? Keep out of the reach of children. Store at room temperature between 20  and 25 degrees C (68 and 77 degrees F). Protect from light. Throw away any unused medicine after the expiration date. NOTE: This sheet is a summary. It may not cover all possible information. If you have questions about this medicine, talk to your doctor, pharmacist, or health care provider.  2018 Elsevier/Gold Standard (2014-12-07 05:39:22)

## 2016-09-15 ENCOUNTER — Other Ambulatory Visit: Payer: Self-pay | Admitting: *Deleted

## 2016-09-15 ENCOUNTER — Telehealth (INDEPENDENT_AMBULATORY_CARE_PROVIDER_SITE_OTHER): Payer: Self-pay | Admitting: *Deleted

## 2016-09-15 LAB — CBC WITH DIFFERENTIAL/PLATELET
BASOS ABS: 0 {cells}/uL (ref 0–200)
Basophils Relative: 0 %
EOS ABS: 27 {cells}/uL (ref 15–500)
Eosinophils Relative: 1 %
HEMATOCRIT: 34.4 % — AB (ref 35.0–45.0)
Hemoglobin: 10.9 g/dL — ABNORMAL LOW (ref 11.7–15.5)
LYMPHS PCT: 39 %
Lymphs Abs: 1053 cells/uL (ref 850–3900)
MCH: 27 pg (ref 27.0–33.0)
MCHC: 31.7 g/dL — ABNORMAL LOW (ref 32.0–36.0)
MCV: 85.4 fL (ref 80.0–100.0)
MONOS PCT: 9 %
MPV: 11.8 fL (ref 7.5–12.5)
Monocytes Absolute: 243 cells/uL (ref 200–950)
Neutro Abs: 1377 cells/uL — ABNORMAL LOW (ref 1500–7800)
Neutrophils Relative %: 51 %
PLATELETS: 157 10*3/uL (ref 140–400)
RBC: 4.03 MIL/uL (ref 3.80–5.10)
RDW: 14.3 % (ref 11.0–15.0)
WBC: 2.7 10*3/uL — ABNORMAL LOW (ref 3.8–10.8)

## 2016-09-15 LAB — URINALYSIS, ROUTINE W REFLEX MICROSCOPIC
BILIRUBIN URINE: NEGATIVE
GLUCOSE, UA: NEGATIVE
Ketones, ur: NEGATIVE
Nitrite: POSITIVE — AB
PH: 5.5 (ref 5.0–8.0)
Protein, ur: NEGATIVE
SPECIFIC GRAVITY, URINE: 1.021 (ref 1.001–1.035)

## 2016-09-15 LAB — IGG, IGA, IGM
IGG (IMMUNOGLOBIN G), SERUM: 4325 mg/dL — AB (ref 694–1618)
IgA: 845 mg/dL — ABNORMAL HIGH (ref 81–463)
IgM, Serum: 96 mg/dL (ref 48–271)

## 2016-09-15 LAB — URINALYSIS, MICROSCOPIC ONLY
CASTS: NONE SEEN [LPF]
Crystals: NONE SEEN [HPF]
SQUAMOUS EPITHELIAL / LPF: NONE SEEN [HPF] (ref <=5)
WBC, UA: >60 WBC/HPF — AB (ref <=5)
YEAST: NONE SEEN [HPF]

## 2016-09-15 LAB — CYCLIC CITRUL PEPTIDE ANTIBODY, IGG: CYCLIC CITRULLIN PEPTIDE AB: 49 U — AB

## 2016-09-15 LAB — COMPLETE METABOLIC PANEL WITH GFR
ALT: 19 U/L (ref 6–29)
AST: 27 U/L (ref 10–35)
Albumin: 3.5 g/dL — ABNORMAL LOW (ref 3.6–5.1)
Alkaline Phosphatase: 59 U/L (ref 33–130)
BILIRUBIN TOTAL: 0.2 mg/dL (ref 0.2–1.2)
BUN: 25 mg/dL (ref 7–25)
CALCIUM: 9.8 mg/dL (ref 8.6–10.4)
CHLORIDE: 104 mmol/L (ref 98–110)
CO2: 22 mmol/L (ref 20–31)
Creat: 0.97 mg/dL (ref 0.50–1.05)
GFR, EST AFRICAN AMERICAN: 78 mL/min (ref >=60)
GFR, EST NON AFRICAN AMERICAN: 67 mL/min (ref >=60)
Glucose, Bld: 86 mg/dL (ref 65–99)
Potassium: 4.2 mmol/L (ref 3.5–5.3)
Sodium: 136 mmol/L (ref 135–146)
TOTAL PROTEIN: 9.6 g/dL — AB (ref 6.1–8.1)

## 2016-09-15 LAB — HEPATITIS PANEL, ACUTE
HCV Ab: NEGATIVE
HEP A IGM: NONREACTIVE
HEP B C IGM: NONREACTIVE
Hepatitis B Surface Ag: NEGATIVE

## 2016-09-15 LAB — HIV ANTIBODY (ROUTINE TESTING W REFLEX): HIV: NONREACTIVE

## 2016-09-15 LAB — SEDIMENTATION RATE: Sed Rate: 120 mm/hr — ABNORMAL HIGH (ref 0–30)

## 2016-09-15 LAB — RHEUMATOID FACTOR: Rhuematoid fact SerPl-aCnc: 145 IU/mL — ABNORMAL HIGH (ref <14)

## 2016-09-15 NOTE — Progress Notes (Signed)
Urine culture is positive please call Macrobid 100 mg by mouth twice a day for 10 days

## 2016-09-15 NOTE — Telephone Encounter (Signed)
Pt called back and aware of appt 

## 2016-09-15 NOTE — Telephone Encounter (Signed)
Pt has CT Chest appt scheduled at [facility] radiology on Wednesday June 6 at 10:30am, left message on vm to return my call for appt information.

## 2016-09-16 LAB — QUANTIFERON TB GOLD ASSAY (BLOOD)
INTERFERON GAMMA RELEASE ASSAY: NEGATIVE
Mitogen-Nil: 6.79 IU/mL
Quantiferon Nil Value: 0.06 IU/mL
Quantiferon Tb Ag Minus Nil Value: 0.03 IU/mL

## 2016-09-16 LAB — URINE CULTURE

## 2016-09-16 NOTE — Progress Notes (Signed)
Urine culture positive for Escherichia coli. It is sensitive to Macrobid.

## 2016-09-18 NOTE — Progress Notes (Signed)
Repeat UA after Macrobid.Plan MTX after UA.

## 2016-09-19 ENCOUNTER — Telehealth: Payer: Self-pay | Admitting: Rheumatology

## 2016-09-19 MED ORDER — NITROFURANTOIN MONOHYD MACRO 100 MG PO CAPS: 100.0000 mg | ORAL_CAPSULE | Freq: Two times a day (BID) | ORAL | 0 refills | Status: DC

## 2016-09-19 NOTE — Telephone Encounter (Signed)
Patient advised of lab results. Prescription for Macrobid sent to the pharmacy. Patient advised we will  Repeat UA after she finishes Macorbid and then start MTX. Patient verbalized understanding. Patient also states she has scheduled an appointment with an ENT in [facility] Dr. Jenne Campus and sees him on 09/29/16.

## 2016-09-19 NOTE — Telephone Encounter (Signed)
Patient returned your call about her lab results.  CB#[phone].  Thank you.

## 2016-09-20 ENCOUNTER — Ambulatory Visit (HOSPITAL_COMMUNITY): Payer: BC Managed Care – PPO

## 2016-09-21 ENCOUNTER — Ambulatory Visit (HOSPITAL_COMMUNITY)
Admission: RE | Admit: 2016-09-21 | Discharge: 2016-09-21 | Disposition: A | Discharge status: Home / Self Care | Payer: BC Managed Care – PPO | Source: Ambulatory Visit | Attending: Rheumatology | Admitting: Rheumatology

## 2016-09-21 DIAGNOSIS — M3501 Sicca syndrome with keratoconjunctivitis: Secondary | ICD-10-CM | POA: Diagnosis not present

## 2016-09-21 DIAGNOSIS — I700 Atherosclerosis of aorta: Secondary | ICD-10-CM | POA: Diagnosis not present

## 2016-09-21 DIAGNOSIS — R0602 Shortness of breath: Secondary | ICD-10-CM | POA: Diagnosis present

## 2016-09-26 NOTE — Progress Notes (Signed)
CT Chest neg for ILD

## 2016-09-29 ENCOUNTER — Telehealth: Payer: Self-pay | Admitting: Rheumatology

## 2016-09-29 ENCOUNTER — Telehealth: Payer: Self-pay

## 2016-09-29 ENCOUNTER — Other Ambulatory Visit: Payer: Self-pay | Admitting: *Deleted

## 2016-09-29 ENCOUNTER — Other Ambulatory Visit: Payer: Self-pay

## 2016-09-29 DIAGNOSIS — R300 Dysuria: Secondary | ICD-10-CM

## 2016-09-29 NOTE — Telephone Encounter (Signed)
Parris from [facility] called and is needing [place] to refax the referral we sent them for this patient.  Fax#[phone].  CB#[phone].  Thank you.

## 2016-09-29 NOTE — Telephone Encounter (Signed)
Patient would like Dr. Corliss Skains to call Dr. Davina Poke at [facility] to discuss her reasoning in why she referred patient to him.  Patient has already had an appointment with Dr. Jenne Campus, but wasn't sure as to why she was referred to him.  [facility] phone # is [phone].  CB# is [phone]

## 2016-09-29 NOTE — Telephone Encounter (Signed)
Refaxed referral, pending appt

## 2016-09-30 LAB — URINALYSIS, ROUTINE W REFLEX MICROSCOPIC
Bilirubin Urine: NEGATIVE
Glucose, UA: NEGATIVE
Ketones, ur: NEGATIVE
NITRITE: NEGATIVE
PH: 6 (ref 5.0–8.0)
Protein, ur: NEGATIVE
SPECIFIC GRAVITY, URINE: 1.02 (ref 1.001–1.035)

## 2016-09-30 LAB — URINALYSIS, MICROSCOPIC ONLY
Bacteria, UA: NONE SEEN [HPF]
CASTS: NONE SEEN [LPF]
Crystals: NONE SEEN [HPF]
Yeast: NONE SEEN [HPF]

## 2016-10-01 NOTE — Progress Notes (Signed)
She still had a few white cells in her UA. Please get repeat urine culture.

## 2016-10-02 NOTE — Telephone Encounter (Signed)
Pt has Sjogrens and parotid swelling. She was referred to ENT for Parotid gland bx. She is very much concerned that she may have lymphoma.

## 2016-10-04 ENCOUNTER — Other Ambulatory Visit: Payer: Self-pay | Admitting: Radiology

## 2016-10-04 ENCOUNTER — Other Ambulatory Visit: Payer: Self-pay | Admitting: *Deleted

## 2016-10-04 DIAGNOSIS — R300 Dysuria: Secondary | ICD-10-CM

## 2016-10-04 NOTE — Telephone Encounter (Signed)
I called Dr.  Jenne Campus, he explained that patient has parotid swelling and no enlarged lymph nodes. He does not think about parotid swelling is malignant. He believes his parotid swelling is due to underlying Sjogren's. He does not recommend doing a parotid biopsy. He thinks the risk of having parotid biopsy is much higher than the benefit of getting any results. He will discuss this with the patient.

## 2016-10-04 NOTE — Telephone Encounter (Signed)
Spoke with patient and Dr. Jenne Campus would  like to speak with Dr. Corliss Skains directly to make sure they are on the same page as to which procedure perform for the patient. Dr. Synthia Innocent states there are three different procedures he could performa nd wants to speak with you about which one you would like to have done based off what you are trying to find.

## 2016-10-05 LAB — URINE CULTURE

## 2016-10-06 NOTE — Progress Notes (Signed)
Ok to [employer] for MTX 3 tabs po q week. Repeat urine cx is negative.

## 2016-10-10 ENCOUNTER — Telehealth: Payer: Self-pay | Admitting: *Deleted

## 2016-10-10 MED ORDER — METHOTREXATE 2.5 MG PO TABS: 7.5000 mg | ORAL_TABLET | ORAL | 0 refills | Status: DC

## 2016-10-10 NOTE — Telephone Encounter (Signed)
Prescription sent to the pharmacy. Patient advised and reminded of lab schedule.

## 2016-10-10 NOTE — Telephone Encounter (Signed)
-----   Message from Pollyann Savoy, MD sent at 10/06/2016  8:36 AM EDT ----- Ok to rX for MTX 3 tabs po q week. Repeat urine cx is negative.

## 2016-10-23 ENCOUNTER — Telehealth: Payer: Self-pay | Admitting: Pharmacist

## 2016-10-23 MED ORDER — FOLIC ACID 1 MG PO TABS: 1.0000 mg | ORAL_TABLET | Freq: Every day | ORAL | 3 refills | Status: DC

## 2016-10-23 NOTE — Telephone Encounter (Signed)
I received notification from patient's insurance regarding therapeutic alert "methotrexate without folic acid therapy."  Noted patient was started on methotrexate on 10/10/16.  I previously counseled patient on importance of taking folic acid with methotrexate.    I called patient to discuss.  She forgot she had to take folic acid with her methotrexate.  I sent in folic acid prescription to her pharmacy and advised patient to take folic acid 1 mg by mouth daily.    I reminded patient she is due for labs two weeks after starting methotrexate.  Patient voiced understanding.   Lilla Shook, Pharm.D., BCPS, CPP Clinical Pharmacist Pager: [phone] Phone: [phone] 10/23/2016 2:40 PM

## 2016-10-30 ENCOUNTER — Other Ambulatory Visit: Payer: Self-pay | Admitting: Rheumatology

## 2016-10-30 DIAGNOSIS — Z79899 Other long term (current) drug therapy: Secondary | ICD-10-CM

## 2016-10-30 LAB — CBC WITH DIFFERENTIAL/PLATELET
BASOS PCT: 1 %
Basophils Absolute: 19 cells/uL (ref 0–200)
EOS PCT: 2 %
Eosinophils Absolute: 38 cells/uL (ref 15–500)
HEMATOCRIT: 35.5 % (ref 35.0–45.0)
Hemoglobin: 11.5 g/dL — ABNORMAL LOW (ref 11.7–15.5)
LYMPHS ABS: 931 {cells}/uL (ref 850–3900)
LYMPHS PCT: 49 %
MCH: 27.6 pg (ref 27.0–33.0)
MCHC: 32.4 g/dL (ref 32.0–36.0)
MCV: 85.3 fL (ref 80.0–100.0)
MONO ABS: 209 {cells}/uL (ref 200–950)
MPV: 12.1 fL (ref 7.5–12.5)
Monocytes Relative: 11 %
NEUTROS PCT: 37 %
Neutro Abs: 703 cells/uL — ABNORMAL LOW (ref 1500–7800)
Platelets: 136 10*3/uL — ABNORMAL LOW (ref 140–400)
RBC: 4.16 MIL/uL (ref 3.80–5.10)
RDW: 13.9 % (ref 11.0–15.0)
WBC: 1.9 10*3/uL — AB (ref 3.8–10.8)

## 2016-10-30 LAB — COMPREHENSIVE METABOLIC PANEL
ALK PHOS: 68 U/L (ref 33–130)
ALT: 28 U/L (ref 6–29)
AST: 33 U/L (ref 10–35)
Albumin: 3.3 g/dL — ABNORMAL LOW (ref 3.6–5.1)
BILIRUBIN TOTAL: 0.2 mg/dL (ref 0.2–1.2)
BUN: 18 mg/dL (ref 7–25)
CO2: 27 mmol/L (ref 20–31)
CREATININE: 0.89 mg/dL (ref 0.50–1.05)
Calcium: 9.6 mg/dL (ref 8.6–10.4)
Chloride: 106 mmol/L (ref 98–110)
GLUCOSE: 75 mg/dL (ref 65–99)
Potassium: 3.9 mmol/L (ref 3.5–5.3)
SODIUM: 138 mmol/L (ref 135–146)
Total Protein: 9.4 g/dL — ABNORMAL HIGH (ref 6.1–8.1)

## 2016-10-30 NOTE — Addendum Note (Signed)
Addended byCaffie Damme on: 10/30/2016 10:43 AM   Modules accepted: Orders

## 2016-10-31 NOTE — Progress Notes (Signed)
Dc PLQ. Repeat CBC in 3 weeks.

## 2016-11-01 ENCOUNTER — Telehealth: Payer: Self-pay | Admitting: *Deleted

## 2016-11-01 NOTE — Telephone Encounter (Signed)
Attempted to contact the patient and left message for patient to call the office.  

## 2016-11-01 NOTE — Telephone Encounter (Signed)
-----   Message from Pollyann Savoy, MD sent at 10/31/2016  1:15 PM EDT ----- Dc PLQ. Repeat CBC in 3 weeks.

## 2016-11-08 NOTE — Telephone Encounter (Signed)
I spoke with patient and advised her to discontinue methotrexate as she has not noticed any improvement in her symptoms. Her white cell count is low at 1.7. I've advised her to repeat her white cell count in 2 weeks. She has an appointment coming up in September. I've advised her that we will resume her Plaquenil if her white cell count comes back to her baseline.

## 2016-11-08 NOTE — Telephone Encounter (Signed)
Patient advised of lab results. Patient states she discontinued the PLQ when she started the MTX. Patient will come into office and have CBC repeated,.

## 2016-12-01 ENCOUNTER — Other Ambulatory Visit (HOSPITAL_COMMUNITY)
Admission: RE | Admit: 2016-12-01 | Discharge: 2016-12-01 | Disposition: A | Discharge status: Home / Self Care | Payer: BC Managed Care – PPO | Source: Ambulatory Visit | Attending: Obstetrics and Gynecology | Admitting: Obstetrics and Gynecology

## 2016-12-01 ENCOUNTER — Other Ambulatory Visit: Payer: Self-pay

## 2016-12-01 ENCOUNTER — Telehealth: Payer: Self-pay | Admitting: Radiology

## 2016-12-01 ENCOUNTER — Other Ambulatory Visit: Payer: Self-pay | Admitting: Obstetrics and Gynecology

## 2016-12-01 DIAGNOSIS — Z124 Encounter for screening for malignant neoplasm of cervix: Secondary | ICD-10-CM | POA: Insufficient documentation

## 2016-12-01 DIAGNOSIS — D709 Neutropenia, unspecified: Secondary | ICD-10-CM

## 2016-12-01 DIAGNOSIS — Z79899 Other long term (current) drug therapy: Secondary | ICD-10-CM

## 2016-12-01 LAB — CBC WITH DIFFERENTIAL/PLATELET
Basophils Absolute: 0 {cells}/uL (ref 0–200)
Basophils Relative: 0 %
Eosinophils Absolute: 0 {cells}/uL — ABNORMAL LOW (ref 15–500)
Eosinophils Relative: 0 %
HCT: 34.9 % — ABNORMAL LOW (ref 35.0–45.0)
Hemoglobin: 11.4 g/dL — ABNORMAL LOW (ref 11.7–15.5)
Lymphocytes Relative: 46 %
Lymphs Abs: 1150 {cells}/uL (ref 850–3900)
MCH: 27.8 pg (ref 27.0–33.0)
MCHC: 32.7 g/dL (ref 32.0–36.0)
MCV: 85.1 fL (ref 80.0–100.0)
MPV: 11.1 fL (ref 7.5–12.5)
Monocytes Absolute: 250 {cells}/uL (ref 200–950)
Monocytes Relative: 10 %
Neutro Abs: 1100 {cells}/uL — ABNORMAL LOW (ref 1500–7800)
Neutrophils Relative %: 44 %
Platelets: 140 K/uL (ref 140–400)
RBC: 4.1 MIL/uL (ref 3.80–5.10)
RDW: 14.3 % (ref 11.0–15.0)
WBC: 2.5 K/uL — ABNORMAL LOW (ref 3.8–10.8)

## 2016-12-02 NOTE — Telephone Encounter (Signed)
She was advised to discontinue methotrexate. As her WBC count has improved she should resume Plaquenil.

## 2016-12-02 NOTE — Telephone Encounter (Signed)
Labs are stable.

## 2016-12-04 ENCOUNTER — Telehealth: Payer: Self-pay | Admitting: Radiology

## 2016-12-04 NOTE — Telephone Encounter (Signed)
-----   Message from Pollyann Savoy, MD sent at 12/02/2016  7:56 PM EDT ----- Labs are stable

## 2016-12-04 NOTE — Telephone Encounter (Signed)
She was advised to discontinue methotrexate. As her WBC count has improved she should resume Plaquenil.  I have left message asked her to call back or send a my chart message

## 2016-12-05 NOTE — Telephone Encounter (Signed)
Attempted to contact the patient and left message for patient to call the office.  

## 2016-12-06 LAB — CYTOLOGY - PAP: HPV (WINDOPATH): NOT DETECTED

## 2016-12-08 NOTE — Telephone Encounter (Signed)
Attempted to contact the patient and left message for patient to call the office.  

## 2016-12-19 ENCOUNTER — Other Ambulatory Visit: Payer: Self-pay | Admitting: *Deleted

## 2016-12-19 DIAGNOSIS — D709 Neutropenia, unspecified: Secondary | ICD-10-CM

## 2016-12-19 MED ORDER — HYDROXYCHLOROQUINE SULFATE 200 MG PO TABS: 200.0000 mg | ORAL_TABLET | Freq: Every day | ORAL | 2 refills | Status: DC

## 2016-12-19 NOTE — Telephone Encounter (Signed)
Patient advised of results and to resume PLQ. Patient states she needs a refill. Prescription sent to the pharmacy

## 2016-12-20 ENCOUNTER — Other Ambulatory Visit (HOSPITAL_BASED_OUTPATIENT_CLINIC_OR_DEPARTMENT_OTHER): Payer: BC Managed Care – PPO

## 2016-12-20 ENCOUNTER — Telehealth: Payer: Self-pay | Admitting: Oncology

## 2016-12-20 ENCOUNTER — Ambulatory Visit (HOSPITAL_BASED_OUTPATIENT_CLINIC_OR_DEPARTMENT_OTHER): Payer: BC Managed Care – PPO | Admitting: Oncology

## 2016-12-20 VITALS — BP 137/97 | HR 80 | Temp 97.6°F | Resp 18 | Ht 70.0 in | Wt 181.5 lb

## 2016-12-20 DIAGNOSIS — D72821 Monocytosis (symptomatic): Secondary | ICD-10-CM

## 2016-12-20 DIAGNOSIS — M1380 Other specified arthritis, unspecified site: Secondary | ICD-10-CM

## 2016-12-20 DIAGNOSIS — D709 Neutropenia, unspecified: Secondary | ICD-10-CM

## 2016-12-20 DIAGNOSIS — D72819 Decreased white blood cell count, unspecified: Secondary | ICD-10-CM | POA: Diagnosis not present

## 2016-12-20 DIAGNOSIS — G629 Polyneuropathy, unspecified: Secondary | ICD-10-CM | POA: Diagnosis not present

## 2016-12-20 LAB — CBC WITH DIFFERENTIAL/PLATELET
BASO%: 0.4 % (ref 0.0–2.0)
BASOS ABS: 0 10*3/uL (ref 0.0–0.1)
EOS ABS: 0 10*3/uL (ref 0.0–0.5)
EOS%: 0.4 % (ref 0.0–7.0)
HCT: 32.5 % — ABNORMAL LOW (ref 34.8–46.6)
HGB: 10.5 g/dL — ABNORMAL LOW (ref 11.6–15.9)
LYMPH%: 48 % (ref 14.0–49.7)
MCH: 27.5 pg (ref 25.1–34.0)
MCHC: 32.3 g/dL (ref 31.5–36.0)
MCV: 85.1 fL (ref 79.5–101.0)
MONO#: 0.3 10*3/uL (ref 0.1–0.9)
MONO%: 11 % (ref 0.0–14.0)
NEUT#: 0.9 10*3/uL — ABNORMAL LOW (ref 1.5–6.5)
NEUT%: 40.2 % (ref 38.4–76.8)
Platelets: 125 10*3/uL — ABNORMAL LOW (ref 145–400)
RBC: 3.82 10*6/uL (ref 3.70–5.45)
RDW: 14.1 % (ref 11.2–14.5)
WBC: 2.3 10*3/uL — ABNORMAL LOW (ref 3.9–10.3)
lymph#: 1.1 10*3/uL (ref 0.9–3.3)

## 2016-12-20 NOTE — Telephone Encounter (Signed)
Gave patient AVS and calendar of upcoming March appointment. Called [facility] regarding referral. [place]ero works from work que they will contact patient.

## 2016-12-20 NOTE — Progress Notes (Signed)
Hematology and Oncology Follow Up Visit  Megan Reid [id number] 05/09/1965 51 y.o. 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  Prior Therapy: She is status post a bone marrow biopsy which did not show any evidence of dysplasia. PET CT scan in October 2015 did not show any evidence of lymphadenopathy.  Current therapy: She is currently receiving for her rheumatoid arthritis under the care of  Dr. Estanislado Pandy.    Interim History:  Ms Megan Reid presents today for a follow up visit. Since the last visit, she reports few complaints at this time. She is reporting peripheral neuropathy in her lower extremities bilaterally. Spine predominantly sensory in nature. She reports having difficulties standing from a sitting position after she is been seated for an extended period of time. She does report back pain that has been chronic in nature and has been told she has degenerative disc disease.  She continues to report joint swelling in her hands as she currently off treatment for her rheumatoid arthritis. She has been on Plaquenil and subsequently methotrexate was discontinued because of leukocytopenia. Her appetite has been reasonable and continues to maintain her weight. She continues to work full time although she is having difficulties with stamina especially in the morning.   She did not report any headaches or blurry vision or syncope. She does not report any chest pain or palpitation. She does not report any cough. She is not reporting any nausea, vomiting, abdominal pain, hematochezia, melena. She did not report any frequency urgency or hesitancy. She does not report any petechiae or easy bruising. Rest of her review of systems unremarkable.  Medications: I have reviewed the patient's current medications.   Current Outpatient Prescriptions  Medication Sig Dispense Refill  .  albuterol (PROVENTIL HFA;VENTOLIN HFA) 108 (90 BASE) MCG/ACT inhaler Inhale 2 puffs into the lungs 2 (two) times daily. For shortness of breath    . folic acid (FOLVITE) 1 MG tablet Take 1 tablet (1 mg total) by mouth daily. 90 tablet 3  . hydroxychloroquine (PLAQUENIL) 200 MG tablet Take 1 tablet (200 mg total) by mouth daily. 30 tablet 2  . methotrexate (RHEUMATREX) 2.5 MG tablet Take 3 tablets (7.5 mg total) by mouth once a week. Caution:Chemotherapy. Protect from light. 36 tablet 0  . montelukast (SINGULAIR) 10 MG tablet Take 10 mg by mouth every evening.     . nitrofurantoin, macrocrystal-monohydrate, (MACROBID) 100 MG capsule Take 1 capsule (100 mg total) by mouth 2 (two) times daily. 1 po BID x 7days 20 capsule 0  . predniSONE (DELTASONE) 5 MG tablet 4 tabs po x 3 days, 3 tabs po x days, 2 tabs po x 3 days, 1 tab po x 3 days, 1/2 tab po x 3 days 32 tablet 0   No current facility-administered medications for this visit.     Allergies:  Allergies  Allergen Reactions  . Strawberry Extract Other (See Comments)    Unknown reaction  . Sulfa Antibiotics Anaphylaxis, Hives and Swelling    Swelling of the face and tongue     Past Medical History, Surgical history, Social history, and Family History were reviewed and updated.   Physical Exam: Blood pressure (!) 137/97, pulse 80, temperature 97.6 F (36.4 C), temperature source Oral, resp. rate 18, height _0  (1.778 m), weight 181 lb 8 oz (82.3 kg), last menstrual period 05/22/2012, SpO2 100 %. ECOG: 1 General appearance: A well-appearing woman appearing  without distress. Head: Normocephalic, without obvious abnormality no oral ulcers or lesions. Neck: No adenopathy  Lymph nodes: No lymphadenopathy noted. Heart:regular rate and rhythm, S1, S2 normal, no murmur, click, rub or gallop Lung:chest clear, expiratory wheezes noted. Abdomin: soft, non-tender, without masses or organomegaly no shifting dullness or ascites. EXT:no erythema,  induration, or nodules   Lab Results: Lab Results  Component Value Date   WBC 2.3 (L) 12/20/2016   HGB 10.5 (L) 12/20/2016   HCT 32.5 (L) 12/20/2016   MCV 85.1 12/20/2016   PLT 125 (L) 12/20/2016     Chemistry      Component Value Date/Time   NA 138 10/30/2016 1046   NA 142 09/29/2015   NA 139 04/13/2015 1323   K 3.9 10/30/2016 1046   K 4.2 04/13/2015 1323   CL 106 10/30/2016 1046   CL 104 03/23/2013 0101   CL 105 01/11/2012 1349   CO2 27 10/30/2016 1046   CO2 26 04/13/2015 1323   BUN 18 10/30/2016 1046   BUN 18 09/29/2015   BUN 15.9 04/13/2015 1323   CREATININE 0.89 10/30/2016 1046   CREATININE 0.9 04/13/2015 1323   GLU 92 09/29/2015      Component Value Date/Time   CALCIUM 9.6 10/30/2016 1046   CALCIUM 9.7 04/13/2015 1323   ALKPHOS 68 10/30/2016 1046   ALKPHOS 65 04/13/2015 1323   AST 33 10/30/2016 1046   AST 26 04/13/2015 1323   ALT 28 10/30/2016 1046   ALT 13 04/13/2015 1323   BILITOT 0.2 10/30/2016 1046   BILITOT 0.30 04/13/2015 1323        Impression and Plan:  51 year old woman with the following issues:   1. Leukocytopenia: Her white cell count currently at 2.3 with absolute neutrophil count of 900. Her differential was otherwise normal. The etiology appears to be related to chronic fluctuation as well as autoimmune disease.  She had hematological workup in the past including a PET scan as well as a bone marrow biopsy. At this point, I see no evidence to suggest a blood disorder.   2. Mild anemia and thrombocytopenia: Continues to be stable and reactive in nature.  3. Elevated sedimentation rate: Likely related to autoimmune disease rather than hematological disorder.  4. Peripheral neuropathy: These are new findings for her at this time etiology is unknown. I will refer her to neurology for an evaluation regarding these findings.  5. Followup: Will be in 6 months.Zola Button, MD 9/5/20183:56 PM

## 2016-12-21 ENCOUNTER — Encounter: Payer: Self-pay | Admitting: Neurology

## 2017-02-12 ENCOUNTER — Encounter: Payer: Self-pay | Admitting: Internal Medicine

## 2017-02-13 ENCOUNTER — Ambulatory Visit (INDEPENDENT_AMBULATORY_CARE_PROVIDER_SITE_OTHER): Payer: BC Managed Care – PPO | Admitting: Internal Medicine

## 2017-02-13 ENCOUNTER — Encounter: Payer: Self-pay | Admitting: Internal Medicine

## 2017-02-13 ENCOUNTER — Other Ambulatory Visit: Payer: Self-pay | Admitting: Internal Medicine

## 2017-02-13 VITALS — BP 112/68 | HR 79 | Ht 69.0 in | Wt 181.2 lb

## 2017-02-13 DIAGNOSIS — D8989 Other specified disorders involving the immune mechanism, not elsewhere classified: Secondary | ICD-10-CM

## 2017-02-13 DIAGNOSIS — M170 Bilateral primary osteoarthritis of knee: Secondary | ICD-10-CM

## 2017-02-13 DIAGNOSIS — J4520 Mild intermittent asthma, uncomplicated: Secondary | ICD-10-CM

## 2017-02-13 DIAGNOSIS — M3501 Sicca syndrome with keratoconjunctivitis: Secondary | ICD-10-CM

## 2017-02-13 DIAGNOSIS — M359 Systemic involvement of connective tissue, unspecified: Secondary | ICD-10-CM

## 2017-02-13 DIAGNOSIS — M5136 Other intervertebral disc degeneration, lumbar region: Secondary | ICD-10-CM | POA: Diagnosis not present

## 2017-02-13 DIAGNOSIS — S90511A Abrasion, right ankle, initial encounter: Secondary | ICD-10-CM | POA: Diagnosis not present

## 2017-02-13 DIAGNOSIS — E559 Vitamin D deficiency, unspecified: Secondary | ICD-10-CM

## 2017-02-13 DIAGNOSIS — M51369 Other intervertebral disc degeneration, lumbar region without mention of lumbar back pain or lower extremity pain: Secondary | ICD-10-CM

## 2017-02-13 MED ORDER — MONTELUKAST SODIUM 10 MG PO TABS: 10.0000 mg | ORAL_TABLET | Freq: Every evening | ORAL | 5 refills | Status: DC

## 2017-02-13 MED ORDER — FLUTICASONE-SALMETEROL 100-50 MCG/DOSE IN AEPB: 1.0000 | INHALATION_SPRAY | Freq: Two times a day (BID) | RESPIRATORY_TRACT | 5 refills | Status: DC

## 2017-02-13 MED ORDER — MUPIROCIN CALCIUM 2 % EX CREA: 1.0000 "application " | TOPICAL_CREAM | Freq: Two times a day (BID) | CUTANEOUS | 0 refills | Status: DC

## 2017-02-13 MED ORDER — ALBUTEROL SULFATE HFA 108 (90 BASE) MCG/ACT IN AERS: 2.0000 | INHALATION_SPRAY | Freq: Two times a day (BID) | RESPIRATORY_TRACT | 5 refills | Status: DC

## 2017-02-13 NOTE — Progress Notes (Signed)
Date:  02/13/2017   Name:  Megan Reid   DOB:  [birth year]   MRN:  [id number]   Chief Complaint: Establish Care; Asthma (Refill on Singulair and Inhaler. ); and Numbness (Both feet having tingling and numbness feeling in them. Has to keep feet moving to keep "awake".  Knee MRI- [facility] one month ago. Never got told results. )  Asthma  She complains of cough, shortness of breath and wheezing. This is a recurrent problem. The current episode started more than 1 year ago. The problem occurs intermittently. The problem has been waxing and waning. The cough is non-productive. Pertinent negatives include no fever, myalgias or trouble swallowing. Her symptoms are aggravated by pollen, exercise and exposure to smoke. Her symptoms are alleviated by steroid inhaler, beta-agonist and leukotriene antagonist. She reports moderate improvement on treatment. Her past medical history is significant for asthma.   Numbness - in both feet.  Numbness in the soles at night.  Tingling off and on throughout the day.  Nothing seems to worsen her sx.  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  She has an appointment with Neurology in November for further testing.  Knee pain - MRI at [facility]: 1. Horizontal tear of the posterior horn of the lateral meniscus within the right knee. There is underlying advanced cartilaginous loss and fissuring with associated subchondral reactive changes and subcortical cysts within the posterior tibial plateau, which correspond with the lucencies as seen on the radiograph from 01/08/2017.  Skin redness - she has had swelling and tenderness of lower inner ankle and leg for some time.  On the right there is peeling skin and it is also itchy.  Sjogren's syndrome and likely RA/autoimmune disorder - seen by Rheum at [facility].  Stopped MTX and other medications due to concern over low WBC, mild anemia and borderline low platelets.  She was supposed to start [place] but never  got the Rx.  In the past month she has started on essential oils from a supplier at her work.  She is feeling much better with more energy, less joint pain and swelling and improved mobility. She is not eager to begin on new medication at this time.  Review of Systems  Constitutional: Negative for chills, fatigue and fever.  HENT: Negative for trouble swallowing.   Eyes: Negative for visual disturbance.  Respiratory: Positive for cough, shortness of breath and wheezing.   Gastrointestinal: Negative for abdominal pain, constipation and diarrhea.  Musculoskeletal: Positive for arthralgias. Negative for gait problem, joint swelling and myalgias.  Skin: Positive for color change and wound.  Allergic/Immunologic: Negative for environmental allergies.  Hematological: Negative for adenopathy.  Psychiatric/Behavioral: Negative for dysphoric mood and sleep disturbance.    Patient Active Problem List   Diagnosis Date Noted  . Rheumatoid factor positive 09/01/2016  . Leucopenia 09/01/2016  . Autoimmune disease (HCC) 02/14/2016  . Fatigue 02/14/2016  . Arthralgia 02/14/2016  . DDD (degenerative disc disease), lumbar 02/14/2016  . Osteoarthritis of both knees 02/14/2016  . Osteoarthritis of both hands 02/14/2016  . Asthma 02/14/2016  . Migraines 02/14/2016  . Gastritis 02/14/2016  . Vitamin D deficiency 02/14/2016  . Sjogren's disease (HCC) 02/14/2016  . High risk medication use 02/14/2016    Prior to Admission medications   Medication Sig Start Date End Date Taking? Authorizing Provider  albuterol (PROVENTIL HFA;VENTOLIN HFA) 108 (90 BASE) MCG/ACT inhaler Inhale 2 puffs into the lungs 2 (two) times daily. For shortness of  breath    [provider]  folic acid (FOLVITE) 1 MG tablet Take 1 tablet (1 mg total) by mouth daily. 10/23/16   Pollyann Savoy, MD  hydroxychloroquine (PLAQUENIL) 200 MG tablet Take 1 tablet (200 mg total) by mouth daily. 12/19/16   Pollyann Savoy, MD    methotrexate (RHEUMATREX) 2.5 MG tablet Take 3 tablets (7.5 mg total) by mouth once a week. Caution:Chemotherapy. Protect from light. 10/10/16   Pollyann Savoy, MD  montelukast (SINGULAIR) 10 MG tablet Take 10 mg by mouth every evening.  05/28/13   [provider]  nitrofurantoin, macrocrystal-monohydrate, (MACROBID) 100 MG capsule Take 1 capsule (100 mg total) by mouth 2 (two) times daily. 1 po BID x 7days 09/19/16   Pollyann Savoy, MD  predniSONE (DELTASONE) 5 MG tablet 4 tabs po x 3 days, 3 tabs po x days, 2 tabs po x 3 days, 1 tab po x 3 days, 1/2 tab po x 3 days 09/14/16   Pollyann Savoy, MD    Allergies  Allergen Reactions  . Strawberry Extract Other (See Comments)    Unknown reaction  . Sulfa Antibiotics Anaphylaxis, Hives and Swelling    Swelling of the face and tongue     Past Surgical History:  Procedure Laterality Date  . TUBAL LIGATION      Social History  Substance Use Topics  . Smoking status: Former Smoker    Packs/day: 0.50    Years: 2.00    Types: Cigarettes    Quit date: 05/16/1986  . Smokeless tobacco: Never Used     Comment: TEEN AGER  . Alcohol use No     Medication list has been reviewed and updated.  PHQ 2/9 Scores 02/13/2017  PHQ - 2 Score 0    Physical Exam  Constitutional: She is oriented to person, place, and time. She appears well-developed. No distress.  HENT:  Head: Normocephalic and atraumatic.  Neck: Normal range of motion. Neck supple. No thyromegaly present.  Cardiovascular: Normal rate, regular rhythm and normal heart sounds.   Pulmonary/Chest: Effort normal and breath sounds normal. No respiratory distress. She has no wheezes.  Musculoskeletal:  No active synovitis noted in hands or wrists. She is tender along lateral right knee joint.  No effusion noted.   Neurological: She is alert and oriented to person, place, and time.  Skin: Skin is warm and dry. No rash noted.     Psychiatric: She has a normal mood and  affect. Her speech is normal and behavior is normal. Thought content normal.  Nursing note and vitals reviewed.   BP 112/68   Pulse 79   Ht 5\' 9"  (1.753 m)   Wt 181 lb 3.2 oz (82.2 kg)   LMP 05/22/2012   SpO2 99%   BMI 26.76 kg/m   Assessment and Plan: 1. Mild intermittent asthma without complication Resume maintenance medication Pt declines flu vaccine due to low WBC - montelukast (SINGULAIR) 10 MG tablet; Take 1 tablet (10 mg total) by mouth every evening.  Dispense: 30 tablet; Refill: 5 - albuterol (PROVENTIL HFA;VENTOLIN HFA) 108 (90 Base) MCG/ACT inhaler; Inhale 2 puffs into the lungs 2 (two) times daily. For shortness of breath  Dispense: 18 g; Refill: 5 - Fluticasone-Salmeterol (ADVAIR DISKUS) 100-50 MCG/DOSE AEPB; Inhale 1 puff into the lungs 2 (two) times daily.  Dispense: 60 each; Refill: 5  2. Primary osteoarthritis of both knees With mensical tear and cortical changes in posterior tibial plateau Recommend follow up with Orthopedics of her choice  3.  DDD (degenerative disc disease), lumbar Noted on MRI in 2015 Has received ESI in the past for this  4. Abrasion of right ankle, initial encounter - mupirocin cream (BACTROBAN) 2 %; Apply 1 application topically 2 (two) times daily.  Dispense: 15 g; Refill: 0  5. Vitamin D deficiency Continue supplementation  6. 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HCC) Currently on no medications Sx stable  7. Autoimmune disease (HCC) Continue homeopathic essential oils Unclear if neuropathic sx are related - will see Neurology   Meds ordered this encounter  Medications  . mupirocin cream (BACTROBAN) 2 %    Sig: Apply 1 application topically 2 (two) times daily.    Dispense:  15 g    Refill:  0  . montelukast (SINGULAIR) 10 MG tablet    Sig: Take 1 tablet (10 mg total) by mouth every evening.    Dispense:  30 tablet    Refill:  5  . albuterol (PROVENTIL HFA;VENTOLIN HFA) 108 (90 Base) MCG/ACT inhaler     Sig: Inhale 2 puffs into the lungs 2 (two) times daily. For shortness of breath    Dispense:  18 g    Refill:  5  . Fluticasone-Salmeterol (ADVAIR DISKUS) 100-50 MCG/DOSE AEPB    Sig: Inhale 1 puff into the lungs 2 (two) times daily.    Dispense:  60 each    Refill:  5    Partially dictated using Animal nutritionist. Any errors are unintentional.  Bari Edward, MD [facility]  02/13/2017

## 2017-03-29 NOTE — Progress Notes (Signed)
[facility] Clinic Note - Initial Visit   Date: 03/30/17  Megan Reid MRN: [id number] DOB: 1965-10-16   Dear Dr. Alen Blew:  Thank you for your kind referral of Megan Reid for consultation of neuropathy. Although her history is well known to you, please allow [place] to reiterate it for the purpose of our medical record. The patient was accompanied to the clinic by self.    History of Present Illness: Megan Reid is a 51 y.o. right-handed African American female with asthma, Sjogren's disease, cytopenia, and lumbar spinal stenosis presenting for evaluation of bilateral feet pain.    Starting around 2014, she began having numbness and tingling over the soles of the feet.  She does not have a numbness or tingling involving the dorsum of the feet or above the ankles.  Symptoms were intermittent at first and over the past year, symptoms have become constant.  She has some imbalance, but has not fallen.  There are no identfiable triggers, exacerbating or alleviating factors.  Her knees can buckle sometimes, so she is cautious when walking.  She cannot determine whether this is pain or weakness related. 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  She briefly did physical therapy in 2016.  She continues to have intermittent low back pain, which is sore and occasionally radiating into the right leg.   She is being followed by Dr. Franchot Gallo here for polyarthralgia and fatigue in the setting of positive ANA (1:640) and elevated ESR (105) who was treating her for Sjogren's disease with methotrexate and plaquenil.  However, this was stopped due to worsening cytopenia.  She was recently evaluated by Dr. Manuella Ghazi at [facility] for this and was offered to start leflunamide, but she did not start this due to side effect profile.  She is treating her symptoms with natural supplements and feels less fatigued.   Out-side paper records,  electronic medical record, and images have been reviewed where available and summarized as:  MRI lumbar spine wo contrast 08/14/2013:  Severe facet disease at L5-S1 contributing to non isthmic  spondylolisthesis of L5. There is associated advanced degenerative disc disease and there is moderately severe spinal and bilateral lateral recess stenosis and moderate to moderately severe bilateral foraminal stenosis.   Lab Results  Component Value Date   TSH 0.84 02/15/2016     Past Medical History:  Diagnosis Date  . Arthralgia 02/14/2016  . Asthma   . Autoimmune disease ([place]) 02/14/2016   Positive RF 139 CCP Negative  Positive ANA 1:640 Positive Ro Positive La Elevated ESR 105 - Sjorgen's Disease  . DDD (degenerative disc disease), lumbar 02/14/2016  . Fatigue 02/14/2016  . Gastritis 02/14/2016  . Hot flashes   . Migraines 02/14/2016  . Osteoarthritis of both hands 02/14/2016  . Osteoarthritis of both knees 02/14/2016  . Vitamin D deficiency 02/14/2016    Past Surgical History:  Procedure Laterality Date  . TUBAL LIGATION       Medications:  Outpatient Encounter Medications as of 03/30/2017  Medication Sig  . albuterol (PROVENTIL HFA;VENTOLIN HFA) 108 (90 Base) MCG/ACT inhaler Inhale 2 puffs into the lungs 2 (two) times daily. For shortness of breath  . Fluticasone-Salmeterol (ADVAIR DISKUS) 100-50 MCG/DOSE AEPB Inhale 1 puff into the lungs 2 (two) times daily.  . [DISCONTINUED] montelukast (SINGULAIR) 10 MG tablet Take 1 tablet (10 mg total) by mouth every evening.  . [DISCONTINUED] mupirocin cream (BACTROBAN) 2 % Apply 1 application topically 2 (two) times  daily.   No facility-administered encounter medications on file as of 03/30/2017.      Allergies:  Allergies  Allergen Reactions  . Strawberry Extract Other (See Comments)    Unknown reaction  . Sulfa Antibiotics Anaphylaxis, Hives and Swelling    Swelling of the face and tongue     Family History: Family History    Problem Relation Age of Onset  . Kidney failure Mother   . Heart failure Mother   . Lupus Mother   . COPD Father   . Hypertension Sister   . Acromegaly Sister     Social History: Social History   Tobacco Use  . Smoking status: Former Smoker    Packs/day: 0.50    Years: 2.00    Pack years: 1.00    Types: Cigarettes    Last attempt to quit: 05/16/1986    Years since quitting: 30.8  . Smokeless tobacco: Never Used  . Tobacco comment: TEEN AGER  Substance Use Topics  . Alcohol use: No  . Drug use: No   Social History   Social History Narrative   Lives with husband in a 2 story home.  Has 4 children.  Teacher.  Education: college.     Review of Systems:  CONSTITUTIONAL: No fevers, chills, night sweats, +weight loss.   EYES: No visual changes or eye pain ENT: No hearing changes.  No history of nose bleeds.   RESPIRATORY: No cough, wheezing and shortness of breath.   CARDIOVASCULAR: Negative for chest pain, and palpitations.   GI: Negative for abdominal discomfort, blood in stools or black stools.  No recent change in bowel habits.   GU:  No history of incontinence.   MUSCLOSKELETAL: +history of joint pain or swelling.  No myalgias.   SKIN: Negative for lesions, rash, and itching.   HEMATOLOGY/ONCOLOGY: Negative for prolonged bleeding, bruising easily, and swollen nodes.  No history of cancer.   ENDOCRINE: Negative for cold or heat intolerance, polydipsia or goiter.   PSYCH:  No depression or anxiety symptoms.   NEURO: As Above.   Vital Signs:  BP 130/80   Pulse 84   Ht 5' 9"  (1.753 m)   Wt 167 lb 2 oz (75.8 kg)   LMP 05/22/2012   SpO2 97%   BMI 24.68 kg/m    General Medical Exam:   General:  Well appearing, comfortable.   Eyes/ENT: see cranial nerve examination.   Neck: No masses appreciated.  Full range of motion without tenderness.  No carotid bruits. Respiratory:  Clear to auscultation, good air entry bilaterally.   Cardiac:  Regular rate and rhythm, no  murmur.   Extremities:  No deformities, edema, or skin discoloration.  Skin:  No rashes or lesions.  Neurological Exam: MENTAL STATUS including orientation to time, place, person, recent and remote memory, attention span and concentration, language, and fund of knowledge is normal.  Speech is not dysarthric.  CRANIAL NERVES: II:  No visual field defects.  Unremarkable fundi.   III-IV-VI: Pupils equal round and reactive to light.  Normal conjugate, extra-ocular eye movements in all directions of gaze.  No nystagmus.  No ptosis.   V:  Normal facial sensation.    VII:  Normal facial symmetry and movements.  No pathologic facial reflexes.  VIII:  Normal hearing and vestibular function.   IX-X:  Normal palatal movement.   XI:  Normal shoulder shrug and head rotation.   XII:  Normal tongue strength and range of motion, no deviation or fasciculation.  MOTOR:  No atrophy, fasciculations or abnormal movements.  No pronator drift.  Tone is normal.    Right Upper Extremity:    Left Upper Extremity:    Deltoid  5/5   Deltoid  5/5   Biceps  5/5   Biceps  5/5   Triceps  5/5   Triceps  5/5   Wrist extensors  5/5   Wrist extensors  5/5   Wrist flexors  5/5   Wrist flexors  5/5   Finger extensors  5/5   Finger extensors  5/5   Finger flexors  5/5   Finger flexors  5/5   Dorsal interossei  5/5   Dorsal interossei  5/5   Abductor pollicis  5/5   Abductor pollicis  5/5   Tone (Ashworth scale)  0  Tone (Ashworth scale)  0   Right Lower Extremity:    Left Lower Extremity:    Hip flexors  5/5   Hip flexors  5/5   Hip extensors  5/5   Hip extensors  5/5   Knee flexors  5/5   Knee flexors  5/5   Knee extensors  5/5   Knee extensors  5/5   Dorsiflexors  5/5   Dorsiflexors  5/5   Plantarflexors  5/5   Plantarflexors  5/5   Toe extensors  5/5   Toe extensors  5/5   Toe flexors  5/5   Toe flexors  5/5   Tone (Ashworth scale)  0  Tone (Ashworth scale)  0   MSRs:  Right                                                                  Left brachioradialis 2+  brachioradialis 2+  biceps 2+  biceps 2+  triceps 2+  triceps 2+  patellar 2+  patellar 2+  ankle jerk 0  ankle jerk 2+  Hoffman no  Hoffman no  plantar response down  plantar response down   SENSORY:  Normal and symmetric perception of light touch, pinprick, vibration, and proprioception.  Romberg's sign absent.   COORDINATION/GAIT: Normal finger-to- nose-finger and heel-to-shin.  Intact rapid alternating movements bilaterally.  Able to rise from a chair without using arms.  Gait slightly wide-based and stable, unassisted.  She is able to perform stressed and tandem gait.   IMPRESSION: Mrs. Wesche is a 51 year-old female with Sjogren's disease referred for evaluation of bilateral feet paresthesias.  Her exam does not disclose any sensory or motor deficits.  Her right Achilles reflex is absent and most likely due to S1 radiculopathy. She has known spinal canal and biforaminal stenosis at L5-S1 and does complain of right leg radicular pain in addition to constant low back pain.  I explained that Sjogren's disease can be associated with neuropathy and it is possible her symptoms are early and distal manifestation of neuropathy; however, she may also have similar symptoms from S1 radiculopathy.  To better localize her symptoms, I recommend NCS/EMG of the legs.   PLAN/RECOMMENDATIONS:  1.  NCS/EMG of the legs 2.  Check vitamin B12, MMA, folate, copper for treatable causes of neuropathy  3.  Gabapentin was declined, she will contact us if she changes her mind 4.  If there is no evidence of neuropathy  on EMG can consider skin biopsy for small fiber neuropathy and/or starting physical therapy for low back strengthening/stretching to see if this will help.   Return to clinic in 4 months.   Thank you for allowing me to participate in patient's care.  If I can answer any additional questions, I would be pleased to do so.     Sincerely,    Clarabel Marion K. Posey Pronto, DO

## 2017-03-30 ENCOUNTER — Ambulatory Visit: Payer: BC Managed Care – PPO | Admitting: Neurology

## 2017-03-30 ENCOUNTER — Other Ambulatory Visit (INDEPENDENT_AMBULATORY_CARE_PROVIDER_SITE_OTHER): Payer: BC Managed Care – PPO

## 2017-03-30 ENCOUNTER — Encounter: Payer: Self-pay | Admitting: Neurology

## 2017-03-30 VITALS — BP 130/80 | HR 84 | Ht 69.0 in | Wt 167.1 lb

## 2017-03-30 DIAGNOSIS — R202 Paresthesia of skin: Secondary | ICD-10-CM

## 2017-03-30 DIAGNOSIS — M5417 Radiculopathy, lumbosacral region: Secondary | ICD-10-CM

## 2017-03-30 DIAGNOSIS — M48061 Spinal stenosis, lumbar region without neurogenic claudication: Secondary | ICD-10-CM

## 2017-03-30 LAB — VITAMIN B12: Vitamin B-12: 1230 pg/mL — ABNORMAL HIGH (ref 211–911)

## 2017-03-30 LAB — FOLATE: Folate: 14.3 ng/mL (ref >5.9)

## 2017-03-30 NOTE — Patient Instructions (Signed)
1.  NCS/EMG of the legs 2.  Check labs 3.  If you choose to start a medication for your nerve pain, please call my office  Return to clinic in 4 months

## 2017-04-02 LAB — COPPER, SERUM: Copper: 117 ug/dL (ref 70–175)

## 2017-04-02 LAB — METHYLMALONIC ACID, SERUM: Methylmalonic Acid, Quant: 63 nmol/L — ABNORMAL LOW (ref 87–318)

## 2017-04-24 ENCOUNTER — Ambulatory Visit (INDEPENDENT_AMBULATORY_CARE_PROVIDER_SITE_OTHER): Payer: BC Managed Care – PPO | Admitting: Neurology

## 2017-04-24 DIAGNOSIS — R202 Paresthesia of skin: Secondary | ICD-10-CM | POA: Diagnosis not present

## 2017-04-24 DIAGNOSIS — M5417 Radiculopathy, lumbosacral region: Secondary | ICD-10-CM

## 2017-04-24 NOTE — Procedures (Signed)
[facility]  [address], [address]  [place], [place] [address] Tel: [phone] Fax:  [phone] Test Date:  04/24/2017  Patient: Megan Reid DOB: Jun 30, 1965 Physician: Nita Sickle, DO  Sex: Female Height: 5\' 9"  Ref Phys: , DO  ID#: Nita Sickle Temp: 37.1C Technician:    Patient Complaints: This is a 52 year old female referred for evaluation of bilateral feet paresthesias.  NCV & EMG Findings: Extensive electrodiagnostic testing of the right lower extremity and additional studies of the left shows:  1. Bilateral sural and superficial peroneal sensory responses are within normal limits. 2. Right peroneal motor response is absent at the extensor digitorum brevis, and normal at the tibialis anterior. Left peroneal and bilateral tibial motor responses are within normal limits. 3. Right tibial H reflex shows mildly prolonged latency. Left tibial H reflex study is within normal limits. 4. Chronic motor axon loss changes are seen affecting the right L5-S1 myotomes, without a made active denervation. These findings are not present in the left lower extremity.   Impression: 1. Chronic radiculopathy affecting the right L5-S1 nerve root/segment; moderate in degree electrically. 2. There is no evidence of a sensorimotor polyneuropathy affecting the lower extremities.   ___________________________ 44, DO    Nerve Conduction Studies Anti Sensory Summary Table   Site NR Peak (ms) Norm Peak (ms) P-T Amp (V) Norm P-T Amp  Left Sup Peroneal Anti Sensory (Ant Lat Mall)  37.1C  12 cm    3.1 <4.6 4.0 >4  Right Sup Peroneal Anti Sensory (Ant Lat Mall)  37.1C  12 cm    2.1 <4.6 4.8 >4  Left Sural Anti Sensory (Lat Mall)  37.1C  Calf    3.0 <4.6 6.4 >4  Right Sural Anti Sensory (Lat Mall)  37.1C  Calf    3.0 <4.6 5.7 >4   Motor Summary Table   Site NR Onset (ms) Norm Onset (ms) O-P Amp (mV) Norm O-P Amp Site1 Site2 Delta-0 (ms) Dist (cm) Vel (m/s)  Norm Vel (m/s)  Left Peroneal Motor (Ext Dig Brev)  37.1C  Ankle    4.4 <6.0 2.5 >2.5 B Fib Ankle 8.2 42.0 51 >40  B Fib    12.6  2.4  Poplt B Fib 1.4 8.0 57 >40  Poplt    14.0  2.1         Right Peroneal Motor (Ext Dig Brev)  37.1C  Ankle NR  <6.0  >2.5 B Fib Ankle  0.0  >40  B Fib NR     Poplt B Fib  0.0  >40  Poplt NR            Left Peroneal TA Motor (Tib Ant)  37.1C  Fib Head    3.7 <4.5 3.0 >3 Poplit Fib Head 1.4 8.0 57 >40  Poplit    5.1  2.9         Right Peroneal TA Motor (Tib Ant)  37.1C  Fib Head    3.0 <4.5 3.7 >3 Poplit Fib Head 1.4 8.0 57 >40  Poplit    4.4  3.7         Left Tibial Motor (Abd Hall Brev)  37.1C  Ankle    5.2 <6.0 4.7 >4 Knee Ankle 8.9 45.0 51 >40  Knee    14.1  2.9         Right Tibial Motor (Abd Hall Brev)  37.1C  Ankle    5.2 <6.0 6.7 >4 Knee Ankle 9.6 0.0  >40  Knee    14.8  4.7          H Reflex Studies   NR H-Lat (ms) Lat Norm (ms) L-R H-Lat (ms)  Left Tibial (Gastroc)  37.1C     34.01 <35 2.18  Right Tibial (Gastroc)  37.1C     36.19 <35 2.18   EMG   Side Muscle Ins Act Fibs Psw Fasc Number Recrt Dur Dur. Amp Amp. Poly Poly. Comment  Right AntTibialis Nml Nml Nml Nml 1- Rapid Some 1+ Some 1+ Nml Nml N/A  Right Gastroc Nml Nml Nml Nml 1- Rapid Some 1+ Some 1+ Nml Nml N/A  Right Flex Dig Long Nml Nml Nml Nml 1- Rapid Some 1+ Some 1+ Nml Nml N/A  Right RectFemoris Nml Nml Nml Nml Nml Nml Nml Nml Nml Nml Nml Nml N/A  Right GluteusMed Nml Nml Nml Nml Nml Nml Nml Nml Nml Nml Nml Nml N/A  Right BicepsFemS Nml Nml Nml Nml 1- Rapid Some 1+ Some 1+ Nml Nml N/A  Left BicepsFemS Nml Nml Nml Nml Nml Nml Nml Nml Nml Nml Nml Nml N/A  Left AntTibialis Nml Nml Nml Nml Nml Nml Nml Nml Nml Nml Nml Nml N/A  Left Gastroc Nml Nml Nml Nml Nml Nml Nml Nml Nml Nml Nml Nml N/A  Left Flex Dig Long Nml Nml Nml Nml Nml Nml Nml Nml Nml Nml Nml Nml N/A  Left RectFemoris Nml Nml Nml Nml Nml Nml Nml Nml Nml Nml Nml Nml N/A  Left GluteusMed Nml Nml Nml Nml Nml  Nml Nml Nml Nml Nml Nml Nml N/A      Waveforms:

## 2017-04-25 ENCOUNTER — Telehealth: Payer: Self-pay | Admitting: *Deleted

## 2017-04-25 NOTE — Telephone Encounter (Signed)
Patient given results and will start PT at [facility] in [place].  Referral sent.

## 2017-04-25 NOTE — Telephone Encounter (Signed)
-----   Message from Glendale Chard, DO sent at 04/25/2017  1:24 PM EST ----- Please inform patient that her nerve testing shows that she has impingement in her back and that I would recommend starting physical therapy for low back strengthening.  No signs of neuropathy on her testing.  Thanks.

## 2017-05-22 ENCOUNTER — Telehealth: Payer: Self-pay | Admitting: Oncology

## 2017-05-22 NOTE — Telephone Encounter (Signed)
Spoke to patient regarding upcoming March appointment updates per patient request.

## 2017-06-04 ENCOUNTER — Other Ambulatory Visit: Payer: Self-pay | Admitting: Obstetrics and Gynecology

## 2017-06-04 ENCOUNTER — Other Ambulatory Visit (HOSPITAL_COMMUNITY)
Admission: RE | Admit: 2017-06-04 | Discharge: 2017-06-04 | Disposition: A | Discharge status: Home / Self Care | Payer: BC Managed Care – PPO | Source: Ambulatory Visit | Attending: Obstetrics and Gynecology | Admitting: Obstetrics and Gynecology

## 2017-06-04 DIAGNOSIS — Z124 Encounter for screening for malignant neoplasm of cervix: Secondary | ICD-10-CM | POA: Insufficient documentation

## 2017-06-04 DIAGNOSIS — Z1231 Encounter for screening mammogram for malignant neoplasm of breast: Secondary | ICD-10-CM

## 2017-06-07 LAB — CYTOLOGY - PAP: HPV: NOT DETECTED

## 2017-06-20 ENCOUNTER — Ambulatory Visit: Payer: BC Managed Care – PPO | Admitting: Oncology

## 2017-06-20 ENCOUNTER — Other Ambulatory Visit: Payer: BC Managed Care – PPO

## 2017-06-27 ENCOUNTER — Other Ambulatory Visit: Payer: BC Managed Care – PPO

## 2017-06-27 ENCOUNTER — Ambulatory Visit
Admission: RE | Admit: 2017-06-27 | Discharge: 2017-06-27 | Disposition: A | Discharge status: Home / Self Care | Payer: BC Managed Care – PPO | Source: Ambulatory Visit | Attending: Obstetrics and Gynecology | Admitting: Obstetrics and Gynecology

## 2017-06-27 ENCOUNTER — Ambulatory Visit: Payer: BC Managed Care – PPO | Admitting: Oncology

## 2017-06-27 DIAGNOSIS — Z1231 Encounter for screening mammogram for malignant neoplasm of breast: Secondary | ICD-10-CM

## 2017-07-09 ENCOUNTER — Telehealth: Payer: Self-pay | Admitting: Oncology

## 2017-07-09 NOTE — Telephone Encounter (Signed)
Patient called in stating that she could not make 3/27 appt and requested for next appt to be in April.  Appointments moved per 3/25 phone msg

## 2017-07-11 ENCOUNTER — Ambulatory Visit: Payer: BC Managed Care – PPO | Admitting: Oncology

## 2017-07-11 ENCOUNTER — Other Ambulatory Visit: Payer: BC Managed Care – PPO

## 2017-08-01 ENCOUNTER — Other Ambulatory Visit: Payer: BC Managed Care – PPO

## 2017-08-01 ENCOUNTER — Ambulatory Visit: Payer: BC Managed Care – PPO | Admitting: Oncology

## 2017-08-29 ENCOUNTER — Other Ambulatory Visit: Payer: Self-pay | Admitting: Obstetrics and Gynecology

## 2017-09-04 ENCOUNTER — Telehealth: Payer: Self-pay | Admitting: Oncology

## 2017-09-04 NOTE — Telephone Encounter (Signed)
Returned patient call and gave patient new d/t for la/fu 6/14 at 11:30 am.

## 2017-09-28 ENCOUNTER — Inpatient Hospital Stay: Payer: BC Managed Care – PPO | Attending: Oncology | Admitting: Oncology

## 2017-09-28 ENCOUNTER — Telehealth: Payer: Self-pay

## 2017-09-28 ENCOUNTER — Inpatient Hospital Stay: Payer: BC Managed Care – PPO

## 2017-09-28 VITALS — BP 138/97 | HR 72 | Temp 98.3°F | Resp 19 | Ht 69.0 in | Wt 163.3 lb

## 2017-09-28 DIAGNOSIS — G629 Polyneuropathy, unspecified: Secondary | ICD-10-CM

## 2017-09-28 DIAGNOSIS — D72819 Decreased white blood cell count, unspecified: Secondary | ICD-10-CM | POA: Diagnosis not present

## 2017-09-28 DIAGNOSIS — D638 Anemia in other chronic diseases classified elsewhere: Secondary | ICD-10-CM | POA: Diagnosis not present

## 2017-09-28 DIAGNOSIS — Z79899 Other long term (current) drug therapy: Secondary | ICD-10-CM | POA: Insufficient documentation

## 2017-09-28 DIAGNOSIS — M069 Rheumatoid arthritis, unspecified: Secondary | ICD-10-CM

## 2017-09-28 DIAGNOSIS — M3500 Sicca syndrome, unspecified: Secondary | ICD-10-CM

## 2017-09-28 DIAGNOSIS — D709 Neutropenia, unspecified: Secondary | ICD-10-CM

## 2017-09-28 LAB — CBC WITH DIFFERENTIAL/PLATELET
BASOS ABS: 0 10*3/uL (ref 0.0–0.1)
Basophils Relative: 1 %
EOS PCT: 1 %
Eosinophils Absolute: 0 10*3/uL (ref 0.0–0.5)
HCT: 32.3 % — ABNORMAL LOW (ref 34.8–46.6)
Hemoglobin: 10.4 g/dL — ABNORMAL LOW (ref 11.6–15.9)
LYMPHS PCT: 50 %
Lymphs Abs: 1.2 10*3/uL (ref 0.9–3.3)
MCH: 27.5 pg (ref 25.1–34.0)
MCHC: 32.2 g/dL (ref 31.5–36.0)
MCV: 85.4 fL (ref 79.5–101.0)
MONO ABS: 0.2 10*3/uL (ref 0.1–0.9)
Monocytes Relative: 10 %
Neutro Abs: 0.9 10*3/uL — ABNORMAL LOW (ref 1.5–6.5)
Neutrophils Relative %: 38 %
PLATELETS: 155 10*3/uL (ref 145–400)
RBC: 3.78 MIL/uL (ref 3.70–5.45)
RDW: 14.7 % — AB (ref 11.2–14.5)
WBC: 2.4 10*3/uL — AB (ref 3.9–10.3)

## 2017-09-28 NOTE — Progress Notes (Signed)
Hematology and Oncology Follow Up Visit  Megan Reid [id number] [birth year] 52 y.o. 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  Etiology appears to be reactive in nature without any lymphoproliferative disorder diagnosis.  She has a diagnosis of rheumatoid arthritis as well as Sjogren's disease.  Prior Therapy: She is status post a bone marrow biopsy which did not show any evidence of dysplasia. PET CT scan in October 2015 did not show any evidence of lymphadenopathy.  Current therapy: Active surveillance.   Interim History:  Ms Megan Reid is here for a follow-up visit.  Since her last visit, she reports no major changes in her health.  She is currently off all treatments for her autoimmune diseases including Plaquenil.  She was on steroids for short period of time because of exacerbation of Sjogren's disease.  She reported some weight loss associated with it but is improving at this time.  She does report joint pain in her arms and knees but has improved as of late.  She denies any recent infections or hospitalizations.  She denies any excessive fatigue or tiredness but does report overall minor fatigue issues.  She remains active and continues to work full-time.  She did report one area of bruising on her arm and thigh that have resolved at this time.  She denies any epistaxis or other bleeding complaints.   She did not report any headaches or blurry vision or syncope.  She denies any alteration in mental status or confusion.  She denies any fevers or chills or sweats.  She does not report any chest pain or palpitation. She does not report any cough, wheezing or hemoptysis.  She is not reporting any nausea, vomiting, abdominal pain, hematochezia, melena. She did not report any frequency urgency or hesitancy.  She denies any lymphadenopathy or petechiae.Marland Kitchen Rest of her review of systems is negative.  Medications: I have reviewed the  patient's current medications.   Current Outpatient Medications  Medication Sig Dispense Refill  . albuterol (PROVENTIL HFA;VENTOLIN HFA) 108 (90 Base) MCG/ACT inhaler Inhale 2 puffs into the lungs 2 (two) times daily. For shortness of breath 18 g 5  . Fluticasone-Salmeterol (ADVAIR DISKUS) 100-50 MCG/DOSE AEPB Inhale 1 puff into the lungs 2 (two) times daily. 60 each 5   No current facility-administered medications for this visit.     Allergies:  Allergies  Allergen Reactions  . Strawberry Extract Other (See Comments)    Unknown reaction  . Sulfa Antibiotics Anaphylaxis, Hives and Swelling    Swelling of the face and tongue     Past Medical History, Surgical history, Social history, and Family History were reviewed and updated.   Physical Exam: Blood pressure (!) 138/97, pulse 72, temperature 98.3 F (36.8 C), temperature source Oral, resp. rate 19, height 5' 9"  (1.753 m), weight 163 lb 4.8 oz (74.1 kg), last menstrual period 05/22/2012, SpO2 100 %.   ECOG: 1 General appearance: Alert, awake woman without distress. Head: Atraumatic without abnormalities. Oropharynx: Without any thrush or ulcers.  No mouth sores noted. Eyes: No scleral icterus. Lymph nodes: No cervical, axillary or supraclavicular lymph nodes. Heart:regular rate without any murmurs or gallops. Lung: Clear to auscultation without any rhonchi, wheezes or dullness to percussion. Abdomin: Soft, nontender without any rebound or guarding.  No shifting dullness or ascites. Musculoskeletal: No joint deformity or effusion. Skin: No rashes or lesions.  No ecchymosis noted.   Lab Results: Lab Results  Component Value Date  WBC 2.3 (L) 12/20/2016   HGB 10.5 (L) 12/20/2016   HCT 32.5 (L) 12/20/2016   MCV 85.1 12/20/2016   PLT 125 (L) 12/20/2016     Chemistry      Component Value Date/Time   NA 138 10/30/2016 1046   NA 142 09/29/2015   NA 139 04/13/2015 1323   K 3.9 10/30/2016 1046   K 4.2 04/13/2015 1323    CL 106 10/30/2016 1046   CL 104 03/23/2013 0101   CL 105 01/11/2012 1349   CO2 27 10/30/2016 1046   CO2 26 04/13/2015 1323   BUN 18 10/30/2016 1046   BUN 18 09/29/2015   BUN 15.9 04/13/2015 1323   CREATININE 0.89 10/30/2016 1046   CREATININE 0.9 04/13/2015 1323   GLU 92 09/29/2015      Component Value Date/Time   CALCIUM 9.6 10/30/2016 1046   CALCIUM 9.7 04/13/2015 1323   ALKPHOS 68 10/30/2016 1046   ALKPHOS 65 04/13/2015 1323   AST 33 10/30/2016 1046   AST 26 04/13/2015 1323   ALT 28 10/30/2016 1046   ALT 13 04/13/2015 1323   BILITOT 0.2 10/30/2016 1046   BILITOT 0.30 04/13/2015 1323        Impression and Plan:  52 year old woman with:   1. Leukocytopenia: She continues to have normal differential dating back to 2013.  Her white cell count fluctuated within normal range and as low as 1.9.  The differential diagnosis was reviewed today with the patient.  Her leukocytopenia is likely related to her autoimmune disease with her absolute neutrophil count has fluctuated to normal range at 900.  Other etiologies would include ethnic variation versus lymphoproliferative disorder.  He had work-up in the past to rule out hematological condition including a PET CT scan as well as bone marrow biopsy on 2 separate occasions.  I see no need to repeat another bone marrow biopsy at this time given the stability of her white cell count for at least 6 years.  I have recommended continuing monitoring at this time and consideration for repeat bone marrow biopsy in the future if further cytopenias have been detected.   2.  Mild anemia: Related to chronic disease with a hemoglobin relatively stable over many years.  Iron studies, U20 and folic acid have been checked periodically without any abnormalities detected.   3.  Follow-up: Will be in 6 months to repeat laboratory testing and clinical visit.  15  minutes was spent with the patient face-to-face today.  More than 50% of time was  dedicated to patient counseling, education and coordination of her care.    Zola Button, MD 6/14/201912:08 PM

## 2017-09-28 NOTE — Telephone Encounter (Signed)
Printed avs and calender of upcoming appointment. Per 6/14 los 

## 2017-10-01 ENCOUNTER — Ambulatory Visit: Payer: BC Managed Care – PPO | Admitting: Neurology

## 2018-01-03 ENCOUNTER — Ambulatory Visit: Payer: BC Managed Care – PPO | Admitting: Podiatry

## 2018-01-03 ENCOUNTER — Encounter: Payer: Self-pay | Admitting: Podiatry

## 2018-01-03 ENCOUNTER — Ambulatory Visit (INDEPENDENT_AMBULATORY_CARE_PROVIDER_SITE_OTHER): Payer: BC Managed Care – PPO

## 2018-01-03 VITALS — BP 136/96 | HR 83 | Resp 16

## 2018-01-03 DIAGNOSIS — M2580 Other specified joint disorders, unspecified joint: Secondary | ICD-10-CM | POA: Diagnosis not present

## 2018-01-03 DIAGNOSIS — M2012 Hallux valgus (acquired), left foot: Secondary | ICD-10-CM

## 2018-01-03 DIAGNOSIS — M779 Enthesopathy, unspecified: Secondary | ICD-10-CM

## 2018-01-03 DIAGNOSIS — M2011 Hallux valgus (acquired), right foot: Secondary | ICD-10-CM

## 2018-01-03 DIAGNOSIS — M778 Other enthesopathies, not elsewhere classified: Secondary | ICD-10-CM

## 2018-01-03 NOTE — Progress Notes (Signed)
He presents today for follow-up of the plantar forefoot.  He states is been aching for several months states sometimes it feels soft and sometimes it looks really red.  Burns at times but throbbing pain sub-first metatarsophalangeal joint.  Denies any trauma.  Objective: Vital signs are stable alert and oriented x3.  Pulses are palpable.  Neurologic sensorium is intact.  Degenerative flexors are intact muscle strength is normal symmetrical bilateral.  Orthopedic evaluation demonstrates hallux abductovalgus deformity left greater than right with painful palpation to the tibiofibular sesamoid of the right first metatarsal phalangeal joint with mild hallux valgus deformity.  Taken today demonstrate hypertrophic medial sesamoid with hallux valgus of the right first metatarsal phalangeal joint.  No acute findings were noted.  Assessment: Sesamoiditis right foot.  Plan: Discussed etiology pathology and surgical therapies at this point time discussed surgery in great detail regarding her bunion left.  I also injected with 20 mg Kenalog 5 mg Marcaine between the first and second intermetatarsal space of the right foot.  I will follow-up with her on an as-needed basis.

## 2018-04-02 ENCOUNTER — Inpatient Hospital Stay: Payer: BC Managed Care – PPO | Attending: Oncology | Admitting: Oncology

## 2018-04-02 ENCOUNTER — Inpatient Hospital Stay: Payer: BC Managed Care – PPO

## 2018-04-02 VITALS — BP 137/97 | HR 92 | Temp 98.2°F | Resp 16 | Ht 70.0 in | Wt 164.5 lb

## 2018-04-02 DIAGNOSIS — Z79899 Other long term (current) drug therapy: Secondary | ICD-10-CM | POA: Diagnosis not present

## 2018-04-02 DIAGNOSIS — M069 Rheumatoid arthritis, unspecified: Secondary | ICD-10-CM | POA: Insufficient documentation

## 2018-04-02 DIAGNOSIS — D638 Anemia in other chronic diseases classified elsewhere: Secondary | ICD-10-CM | POA: Diagnosis not present

## 2018-04-02 DIAGNOSIS — D72819 Decreased white blood cell count, unspecified: Secondary | ICD-10-CM | POA: Diagnosis not present

## 2018-04-02 DIAGNOSIS — M3500 Sicca syndrome, unspecified: Secondary | ICD-10-CM | POA: Diagnosis not present

## 2018-04-02 DIAGNOSIS — D709 Neutropenia, unspecified: Secondary | ICD-10-CM

## 2018-04-02 LAB — CBC WITH DIFFERENTIAL (CANCER CENTER ONLY)
ABS IMMATURE GRANULOCYTES: 0 10*3/uL (ref 0.00–0.07)
BASOS ABS: 0 10*3/uL (ref 0.0–0.1)
BASOS PCT: 0 %
Eosinophils Absolute: 0 10*3/uL (ref 0.0–0.5)
Eosinophils Relative: 0 %
HCT: 33.8 % — ABNORMAL LOW (ref 36.0–46.0)
Hemoglobin: 10.6 g/dL — ABNORMAL LOW (ref 12.0–15.0)
IMMATURE GRANULOCYTES: 0 %
Lymphocytes Relative: 38 %
Lymphs Abs: 1.1 10*3/uL (ref 0.7–4.0)
MCH: 27 pg (ref 26.0–34.0)
MCHC: 31.4 g/dL (ref 30.0–36.0)
MCV: 86.2 fL (ref 80.0–100.0)
Monocytes Absolute: 0.4 10*3/uL (ref 0.1–1.0)
Monocytes Relative: 13 %
NEUTROS PCT: 49 %
NRBC: 0 % (ref 0.0–0.2)
Neutro Abs: 1.4 10*3/uL — ABNORMAL LOW (ref 1.7–7.7)
PLATELETS: 108 10*3/uL — AB (ref 150–400)
RBC: 3.92 MIL/uL (ref 3.87–5.11)
RDW: 14.4 % (ref 11.5–15.5)
WBC: 3 10*3/uL — AB (ref 4.0–10.5)

## 2018-04-02 NOTE — Progress Notes (Signed)
Hematology and Oncology Follow Up Visit  Megan Reid [id number] January 13, 1966 52 y.o. 04/02/2018 3:24 PM   Principle Diagnosis: 52 year old woman with leukocytopenia related to reactive process versus autoimmune disease noted in 2013.  Her work-up did not reveal evidence of a hematological condition including PET scan and bone marrow biopsy.   Prior Therapy: She is status post a bone marrow biopsy which did not show any evidence of dysplasia. PET CT scan in October 2015 did not show any evidence of lymphadenopathy.  Current therapy: Active surveillance.   Interim History:  Megan Reid returns today for a repeat evaluation.  Since last visit, she reports no complaints or changes in her health.  She remains active and attends to activities of daily living.  She has not had any recent exacerbation of rheumatoid arthritis.  She denies any constitutional symptoms or weight loss.  She denies any painful adenopathy.   She did not report any headaches or blurry vision or syncope.  She denies any dizziness or lethargy.  She denies any fevers or chills or sweats.  She does not report any chest pain or palpitation. She does not report any cough, wheezing or hemoptysis.  She is not reporting any nausea, vomiting, or abdominal distention.  She denies any changes in bowel habits. She did not report any frequency urgency or hesitancy.  She denies any bleeding or clotting tendency.  Denies any heat or cold intolerance.  Rest of her review of systems is negative.  Medications: I have reviewed the patient's current medications.   Current Outpatient Medications  Medication Sig Dispense Refill  . albuterol (PROVENTIL HFA;VENTOLIN HFA) 108 (90 Base) MCG/ACT inhaler Inhale 2 puffs into the lungs 2 (two) times daily. For shortness of breath 18 g 5  . Fluticasone-Salmeterol (ADVAIR DISKUS) 100-50 MCG/DOSE AEPB Inhale 1 puff into the lungs 2 (two) times daily. 60 each 5  . meloxicam (MOBIC) 7.5 MG tablet TK 1 TO  2 TS PO D PRN P  0   No current facility-administered medications for this visit.     Allergies:  Allergies  Allergen Reactions  . Strawberry Extract Other (See Comments)    Unknown reaction  . Sulfa Antibiotics Anaphylaxis, Hives and Swelling    Swelling of the face and tongue     Past Medical History, Surgical history, Social history, and Family History were reviewed and updated.   Physical Exam:  Blood pressure (!) 137/97, pulse 92, temperature 98.2 F (36.8 C), temperature source Oral, resp. rate 16, height 5' 10"  (1.778 m), weight 164 lb 8 oz (74.6 kg), last menstrual period 05/22/2012, SpO2 100 %.   ECOG: 1   General appearance: Comfortable appearing without any discomfort Head: Normocephalic without any trauma Oropharynx: Mucous membranes are moist and pink without any thrush or ulcers. Eyes: Pupils are equal and round reactive to light. Lymph nodes: No cervical, supraclavicular, inguinal or axillary lymphadenopathy.   Heart:regular rate and rhythm.  S1 and S2 without leg edema. Lung: Clear without any rhonchi or wheezes.  No dullness to percussion. Abdomin: Soft, nontender, nondistended with good bowel sounds.  No hepatosplenomegaly. Musculoskeletal: No joint deformity or effusion.  Full range of motion noted. Neurological: No deficits noted on motor, sensory and deep tendon reflex exam. Skin: No petechial rash or dryness.  Appeared moist.     Lab Results: Lab Results  Component Value Date   WBC 2.4 (L) 09/28/2017   HGB 10.4 (L) 09/28/2017   HCT 32.3 (L) 09/28/2017   MCV 85.4  09/28/2017   PLT 155 09/28/2017     Chemistry      Component Value Date/Time   NA 138 10/30/2016 1046   NA 142 09/29/2015   NA 139 04/13/2015 1323   K 3.9 10/30/2016 1046   K 4.2 04/13/2015 1323   CL 106 10/30/2016 1046   CL 104 03/23/2013 0101   CL 105 01/11/2012 1349   CO2 27 10/30/2016 1046   CO2 26 04/13/2015 1323   BUN 18 10/30/2016 1046   BUN 18 09/29/2015   BUN 15.9  04/13/2015 1323   CREATININE 0.89 10/30/2016 1046   CREATININE 0.9 04/13/2015 1323   GLU 92 09/29/2015      Component Value Date/Time   CALCIUM 9.6 10/30/2016 1046   CALCIUM 9.7 04/13/2015 1323   ALKPHOS 68 10/30/2016 1046   ALKPHOS 65 04/13/2015 1323   AST 33 10/30/2016 1046   AST 26 04/13/2015 1323   ALT 28 10/30/2016 1046   ALT 13 04/13/2015 1323   BILITOT 0.2 10/30/2016 1046   BILITOT 0.30 04/13/2015 1323        Impression and Plan:  52 year old woman with:   1. Leukocytopenia diagnosed in 2013.: She continues to have normal differential dating back to 2013.  Differential has been within normal range previously.  The natural course of these findings and laboratory testing was reviewed today including review of her CBC dating back to 2013.  Her total white cell count have ranged between 2.0 and normal range.  Her overall white cell count has fluctuated without any major changes in the last 6 years.  Her white cell count today is 3.0 with hemoglobin and platelet count that is unchanged.  Management options today were reviewed including continued observation versus repeat imaging studies or a bone marrow biopsy.  At this time I see no indication to repeat this testing that have been done in the past.   2.  Mild anemia: Related to chronic disease and autoimmune disorder.   3.  Follow-up: Will be in 6 months.  15  minutes was spent with the patient face-to-face today.  More than 50% of time was spent on reviewing laboratory data, differential diagnosis of these findings and management options.   Zola Button, MD 12/17/20193:24 PM

## 2018-04-14 ENCOUNTER — Emergency Department (HOSPITAL_COMMUNITY): Payer: BC Managed Care – PPO

## 2018-04-14 ENCOUNTER — Other Ambulatory Visit: Payer: Self-pay

## 2018-04-14 ENCOUNTER — Encounter (HOSPITAL_COMMUNITY): Payer: Self-pay

## 2018-04-14 ENCOUNTER — Emergency Department (HOSPITAL_COMMUNITY)
Admission: EM | Admit: 2018-04-14 | Discharge: 2018-04-15 | Disposition: A | Discharge status: Home / Self Care | Payer: BC Managed Care – PPO | Attending: Emergency Medicine | Admitting: Emergency Medicine

## 2018-04-14 DIAGNOSIS — J189 Pneumonia, unspecified organism: Secondary | ICD-10-CM | POA: Diagnosis not present

## 2018-04-14 DIAGNOSIS — Z87891 Personal history of nicotine dependence: Secondary | ICD-10-CM | POA: Diagnosis not present

## 2018-04-14 DIAGNOSIS — Z79899 Other long term (current) drug therapy: Secondary | ICD-10-CM | POA: Insufficient documentation

## 2018-04-14 DIAGNOSIS — J45909 Unspecified asthma, uncomplicated: Secondary | ICD-10-CM | POA: Insufficient documentation

## 2018-04-14 DIAGNOSIS — K1120 Sialoadenitis, unspecified: Secondary | ICD-10-CM | POA: Insufficient documentation

## 2018-04-14 DIAGNOSIS — R079 Chest pain, unspecified: Secondary | ICD-10-CM

## 2018-04-14 DIAGNOSIS — J181 Lobar pneumonia, unspecified organism: Secondary | ICD-10-CM

## 2018-04-14 DIAGNOSIS — M3501 Sicca syndrome with keratoconjunctivitis: Secondary | ICD-10-CM | POA: Diagnosis not present

## 2018-04-14 LAB — BASIC METABOLIC PANEL
ANION GAP: 7 (ref 5–15)
BUN: 15 mg/dL (ref 6–20)
CHLORIDE: 101 mmol/L (ref 98–111)
CO2: 24 mmol/L (ref 22–32)
CREATININE: 0.82 mg/dL (ref 0.44–1.00)
Calcium: 9.2 mg/dL (ref 8.9–10.3)
GFR calc Af Amer: >60 mL/min (ref >60)
GFR calc non Af Amer: >60 mL/min (ref >60)
Glucose, Bld: 121 mg/dL — ABNORMAL HIGH (ref 70–99)
POTASSIUM: 3.6 mmol/L (ref 3.5–5.1)
Sodium: 132 mmol/L — ABNORMAL LOW (ref 135–145)

## 2018-04-14 LAB — CBC
HEMATOCRIT: 36.4 % (ref 36.0–46.0)
HEMOGLOBIN: 11.3 g/dL — AB (ref 12.0–15.0)
MCH: 27 pg (ref 26.0–34.0)
MCHC: 31 g/dL (ref 30.0–36.0)
MCV: 87.1 fL (ref 80.0–100.0)
Platelets: 141 10*3/uL — ABNORMAL LOW (ref 150–400)
RBC: 4.18 MIL/uL (ref 3.87–5.11)
RDW: 14 % (ref 11.5–15.5)
WBC: 9.2 10*3/uL (ref 4.0–10.5)
nRBC: 0 % (ref 0.0–0.2)

## 2018-04-14 LAB — I-STAT BETA HCG BLOOD, ED (MC, WL, AP ONLY)

## 2018-04-14 LAB — I-STAT TROPONIN, ED: Troponin i, poc: 0.01 ng/mL (ref 0.00–0.08)

## 2018-04-14 MED ORDER — MORPHINE SULFATE (PF) 4 MG/ML IV SOLN
4.0000 mg | Freq: Once | INTRAVENOUS | Status: AC
  Administered 2018-04-14: 4 mg via INTRAVENOUS
  Filled 2018-04-14: qty 1

## 2018-04-14 MED ORDER — ONDANSETRON HCL 4 MG/2ML IJ SOLN
4.0000 mg | Freq: Once | INTRAMUSCULAR | Status: AC
  Administered 2018-04-14: 4 mg via INTRAVENOUS
  Filled 2018-04-14: qty 2

## 2018-04-14 MED ORDER — SODIUM CHLORIDE 0.9 % IV BOLUS
1000.0000 mL | Freq: Once | INTRAVENOUS | Status: AC
  Administered 2018-04-14: 1000 mL via INTRAVENOUS

## 2018-04-14 MED ORDER — IOPAMIDOL (ISOVUE-370) INJECTION 76%
100.0000 mL | Freq: Once | INTRAVENOUS | Status: AC | PRN
  Administered 2018-04-14: 100 mL via INTRAVENOUS

## 2018-04-14 MED ORDER — IOPAMIDOL (ISOVUE-370) INJECTION 76%
INTRAVENOUS | Status: AC
  Filled 2018-04-14: qty 100

## 2018-04-14 MED ORDER — SODIUM CHLORIDE (PF) 0.9 % IJ SOLN
INTRAMUSCULAR | Status: AC
  Filled 2018-04-14: qty 50

## 2018-04-14 NOTE — ED Provider Notes (Signed)
[facility] DEPT Provider Note   CSN: [id number] Arrival date & time: 04/14/18  2009     History   Chief Complaint Chief Complaint  Patient presents with  . Chest Pain    HPI Megan Reid is a 52 y.o. female.  Megan Reid is a 52 y.o. female with a history of Sjogren's, asthma, migraines, osteoarthritis, who presents for evaluation of pain and swelling over the right side of her jaw as well as pain in her chest.  She reports pain and swelling in her jaw started overnight on in the middle of the night on the 27th, on the 28 she had associated fevers up to 102, has continued to have worsening swelling and so presented to urgent care earlier today was started on prednisone and Pen-Vee K and given an IM shot of Rocephin.  She reports that her pain has continued she has started having left-sided chest pains that have been constant and worsening.  Pain is worse with movement and she is unable to lie flat or leaning forward, pain is worse with palpation also worse when she takes a deep breath.  She reports pain is worsened throughout the evening and made her feel progressively more short of breath.  She denies any lower extremity swelling or pain.  She has not had any fevers today.     Past Medical History:  Diagnosis Date  . Arthralgia 02/14/2016  . Asthma   . Autoimmune disease ([place]) 02/14/2016   Positive RF 139 CCP Negative  Positive ANA 1:640 Positive Ro Positive La Elevated ESR 105 - Sjorgen's Disease  . DDD (degenerative disc disease), lumbar 02/14/2016  . Fatigue 02/14/2016  . Gastritis 02/14/2016  . Hot flashes   . Migraines 02/14/2016  . Osteoarthritis of both hands 02/14/2016  . Osteoarthritis of both knees 02/14/2016  . Vitamin D deficiency 02/14/2016    Patient Active Problem List   Diagnosis Date Noted  . Abrasion of right ankle 02/13/2017  . Rheumatoid factor positive 09/01/2016  . Leucopenia 09/01/2016  .  Autoimmune disease ([place]) 02/14/2016  . Fatigue 02/14/2016  . DDD (degenerative disc disease), lumbar 02/14/2016  . Osteoarthritis of both knees 02/14/2016  . Osteoarthritis of both hands 02/14/2016  . Asthma 02/14/2016  . Migraines 02/14/2016  . Gastritis 02/14/2016  . Vitamin D deficiency 02/14/2016  . 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 High risk medication use 02/14/2016    Past Surgical History:  Procedure Laterality Date  . TUBAL LIGATION       OB History   No obstetric history on file.      Home Medications    Prior to Admission medications   Medication Sig Start Date End Date Taking? Authorizing Provider  albuterol (PROVENTIL HFA;VENTOLIN HFA) 108 (90 Base) MCG/ACT inhaler Inhale 2 puffs into the lungs 2 (two) times daily. For shortness of breath 02/13/17  Yes Glean Hess, MD  Fluticasone-Salmeterol (ADVAIR DISKUS) 100-50 MCG/DOSE AEPB Inhale 1 puff into the lungs 2 (two) times daily. 02/13/17  Yes Glean Hess, MD  doxycycline (VIBRAMYCIN) 100 MG capsule Take 1 capsule (100 mg total) by mouth 2 (two) times daily. One po bid x 7 days 04/15/18   Jacqlyn Larsen, PA-C  predniSONE (DELTASONE) 10 MG tablet Take 10 mg by mouth daily with breakfast.    [provider]  traMADol (ULTRAM) 50 MG tablet Take 1 tablet (50 mg total) by mouth every 6 (six) hours as needed. 04/15/18  Jacqlyn Larsen, PA-C    Family History Family History  Problem Relation Age of Onset  . Kidney failure Mother   . Heart failure Mother   . Lupus Mother   . COPD Father   . Hypertension Sister   . Acromegaly Sister   . Breast cancer Paternal Aunt 44    Social History Social History   Tobacco Use  . Smoking status: Former Smoker    Packs/day: 0.50    Years: 2.00    Pack years: 1.00    Types: Cigarettes    Last attempt to quit: 05/16/1986    Years since quitting: 31.9  . Smokeless tobacco: Never Used  . Tobacco comment: TEEN AGER    Substance Use Topics  . Alcohol use: No  . Drug use: No     Allergies   Strawberry extract and Sulfa antibiotics   Review of Systems Review of Systems  Constitutional: Positive for chills and fever.  HENT: Positive for facial swelling. Negative for congestion, dental problem, drooling, ear discharge, rhinorrhea, sore throat and trouble swallowing.   Eyes: Negative for visual disturbance.  Respiratory: Positive for cough and shortness of breath. Negative for chest tightness and wheezing.   Cardiovascular: Positive for chest pain. Negative for palpitations and leg swelling.  Gastrointestinal: Negative for abdominal pain, nausea and vomiting.  Genitourinary: Negative for dysuria and frequency.  Musculoskeletal: Negative for arthralgias and myalgias.  Skin: Negative for color change and rash.  Neurological: Negative for dizziness, syncope and light-headedness.     Physical Exam Updated Vital Signs BP (!) 147/101   Pulse 97   Temp 98.9 F (37.2 C) (Oral)   Resp (!) 22   Ht 5' 10"  (1.778 m)   Wt 73.5 kg   LMP 05/22/2012   SpO2 97%   BMI 23.24 kg/m   Physical Exam Vitals signs and nursing note reviewed.  Constitutional:      General: She is not in acute distress.    Appearance: She is well-developed and normal weight. She is not ill-appearing or diaphoretic.  HENT:     Head: Normocephalic and atraumatic.     Comments: Facial swelling over the right jaw over the parotid and submandibular gland with overlying warmth but no erythema.  There is no noted erythema or drainage over the oral mucosa, no notable dental abscess and teeth appear to be in good dentition overall.  Mild trismus, patient able to tolerate secretions without difficulty and posterior oropharynx is clear. Eyes:     General:        Right eye: No discharge.        Left eye: No discharge.     Pupils: Pupils are equal, round, and reactive to light.  Neck:     Musculoskeletal: Normal range of motion and neck  supple.     Comments: No tenderness over the neck, normal range of motion, no stridor Cardiovascular:     Rate and Rhythm: Normal rate and regular rhythm.     Pulses:          Radial pulses are 2+ on the right side and 2+ on the left side.       Dorsalis pedis pulses are 2+ on the right side and 2+ on the left side.     Heart sounds: No murmur. No friction rub. No gallop.   Pulmonary:     Effort: Pulmonary effort is normal. No respiratory distress.     Breath sounds: Normal breath sounds. No wheezing or rales.  Comments: Respirations equal and unlabored, patient able to speak in full sentences, lungs clear to auscultation bilaterally Chest:     Chest wall: Tenderness present.     Comments: Tenderness to palpation over the left side of the chest wall without overlying erythema or skin changes, no palpable deformity Abdominal:     General: Bowel sounds are normal. There is no distension.     Palpations: Abdomen is soft. There is no mass.     Tenderness: There is no abdominal tenderness. There is no guarding.     Comments: Abdomen soft, nondistended, nontender to palpation in all quadrants without guarding or peritoneal signs  Musculoskeletal:        General: No deformity.     Right lower leg: She exhibits no tenderness. No edema.     Left lower leg: She exhibits no tenderness. No edema.     Comments: Bilateral lower extremities without edema or tenderness.  Skin:    General: Skin is warm and dry.     Capillary Refill: Capillary refill takes less than 2 seconds.  Neurological:     General: No focal deficit present.     Mental Status: She is alert and oriented to person, place, and time.     Coordination: Coordination normal.     Comments: Speech is clear, able to follow commands CN III-XII intact Normal strength in upper and lower extremities bilaterally including dorsiflexion and plantar flexion, strong and equal grip strength Sensation normal to light and sharp touch Moves  extremities without ataxia, coordination intact   Psychiatric:        Mood and Affect: Mood normal.        Behavior: Behavior normal.      ED Treatments / Results  Labs (all labs ordered are listed, but only abnormal results are displayed) Labs Reviewed  BASIC METABOLIC PANEL - Abnormal; Notable for the following components:      Result Value   Sodium 132 (*)    Glucose, Bld 121 (*)    All other components within normal limits  CBC - Abnormal; Notable for the following components:   Hemoglobin 11.3 (*)    Platelets 141 (*)    All other components within normal limits  I-STAT TROPONIN, ED  I-STAT BETA HCG BLOOD, ED ([facility], [facility], [facility] ONLY)    EKG None  Radiology Dg Chest 2 View  Result Date: 04/14/2018 CLINICAL DATA:  Left-sided chest pain and fever. EXAM: CHEST - 2 VIEW COMPARISON:  09/21/2016 CT and 05/12/2014 CXR FINDINGS: Subtle pulmonary opacity at the left lung base suspicious for pneumonia and atelectasis. No overt pulmonary edema. Normal heart size and mediastinal contours. No acute osseous abnormality. IMPRESSION: Faint airspace opacity at left lung base suspicious for pneumonia and/or atelectasis. Electronically Signed   By: Ashley Royalty M.D.   On: 04/14/2018 22:21   Ct Soft Tissue Neck W Contrast  Result Date: 04/14/2018 CLINICAL DATA:  Right facial swelling with fever. History of Sjogren syndrome. EXAM: CT NECK WITH CONTRAST TECHNIQUE: Multidetector CT imaging of the neck was performed using the standard protocol following the bolus administration of intravenous contrast. CONTRAST:  137m ISOVUE-370 IOPAMIDOL (ISOVUE-370) INJECTION 76% COMPARISON:  CT neck 08/21/2014 FINDINGS: PHARYNX AND LARYNX: --Nasopharynx: Fossae of Rosenmuller are clear. Normal adenoid tonsils for age. --Oral cavity and oropharynx: The palatine and lingual tonsils are normal. The visible oral cavity and floor of mouth are normal. --Hypopharynx: Normal vallecula and pyriform sinuses. --Larynx: Normal  epiglottis and pre-epiglottic space. Normal aryepiglottic and  vocal folds. --Retropharyngeal space: No abscess, effusion or lymphadenopathy. SALIVARY GLANDS: There is heterogeneous hyperdensity of the parotid and submandibular glands, slightly enlarged on the right. There is thickening of the overlying right platysma muscle with mild inflammatory induration just anterior to the right submandibular gland. No sialolithiasis or ductal dilatation. THYROID: Normal. LYMPH NODES: No enlarged or abnormal density lymph nodes. VASCULAR: Major cervical vessels are patent. LIMITED INTRACRANIAL: Normal. VISUALIZED ORBITS: Normal. MASTOIDS AND VISUALIZED PARANASAL SINUSES: Moderate ethmoid sinus opacification and partial opacification of the left sphenoid sinus. Mastoid air cells are clear. No middle ear effusion. SKELETON: No bony spinal canal stenosis. No lytic or blastic lesions. UPPER CHEST: Clear. OTHER: None. IMPRESSION: Slightly enlarged right parotid and submandibular glands with overlying inflammatory induration and thickening of the platysma muscle, most suggestive of acute sialadenitis. No sialolithiasis or ductal dilatation. Electronically Signed   By: Ulyses Jarred M.D.   On: 04/14/2018 23:51   Ct Angio Chest Pe W And/or Wo Contrast  Result Date: 04/14/2018 CLINICAL DATA:  Angina. Dyspnea. Right neck swelling. Nausea. Sjogren disease. EXAM: CT ANGIOGRAPHY CHEST WITH CONTRAST TECHNIQUE: Multidetector CT imaging of the chest was performed using the standard protocol during bolus administration of intravenous contrast. Multiplanar CT image reconstructions and MIPs were obtained to evaluate the vascular anatomy. CONTRAST:  141m ISOVUE-370 IOPAMIDOL (ISOVUE-370) INJECTION 76% COMPARISON:  Chest radiograph from earlier today. 09/21/2016 high-resolution chest CT. FINDINGS: Cardiovascular: The study is high quality for the evaluation of pulmonary embolism. There are no filling defects in the central, lobar, segmental  or subsegmental pulmonary artery branches to suggest acute pulmonary embolism. Great vessels are normal in course and caliber. Normal heart size. No significant pericardial fluid/thickening. Mediastinum/Nodes: No discrete thyroid nodules. Unremarkable esophagus. No pathologically enlarged axillary, mediastinal or hilar lymph nodes. Lungs/Pleura: No pneumothorax. No pleural effusion. No acute consolidative airspace disease or lung masses. 2 solid left lower lobe pulmonary nodules, largest 3 mm (series 1/image 64), both stable since 09/21/2016 chest CT, considered benign. No new significant pulmonary nodules. Mild patchy ground-glass opacity and scattered reticulation in the mid to lower lungs bilaterally, new from 09/21/2016 chest CT. Upper abdomen: Punctate nonobstructing upper left renal stone. Musculoskeletal: No aggressive appearing focal osseous lesions. Mild thoracic spondylosis. Review of the MIP images confirms the above findings. IMPRESSION: 1. No pulmonary embolism. 2. Mild patchy ground-glass opacity and scattered reticulation in the mid to lower lungs bilaterally, new from 09/21/2016 chest CT. Differential includes resolving pneumonia or developing interstitial lung disease. Short-term follow-up outpatient high-resolution chest CT may be considered given the history of autoimmune disease. 3. Punctate nonobstructing upper left renal stone. Electronically Signed   By: JIlona SorrelM.D.   On: 04/14/2018 23:36    Procedures Procedures (including critical care time)  Medications Ordered in ED Medications  sodium chloride 0.9 % bolus 1,000 mL (0 mLs Intravenous Stopped 04/15/18 0023)  ondansetron (ZOFRAN) injection 4 mg (4 mg Intravenous Given 04/14/18 2250)  morphine 4 MG/ML injection 4 mg (4 mg Intravenous Given 04/14/18 2251)  iopamidol (ISOVUE-370) 76 % injection 100 mL (100 mLs Intravenous Contrast Given 04/14/18 2301)  cefTRIAXone (ROCEPHIN) 1 g in sodium chloride 0.9 % 100 mL IVPB (0 g  Intravenous Stopped 04/15/18 0159)  doxycycline (VIBRA-TABS) tablet 100 mg (100 mg Oral Given 04/15/18 0127)  ketorolac (TORADOL) 30 MG/ML injection 30 mg (30 mg Intravenous Given 04/15/18 0123)     Initial Impression / Assessment and Plan / ED Course  I have reviewed the triage vital signs and the nursing  notes.  Pertinent labs & imaging results that were available during my care of the patient were reviewed by me and considered in my medical decision making (see chart for details).  Patient presents for evaluation of right-sided facial swelling over the past 2 days.  She is also started having left-sided chest pains.  On arrival patient mildly tachypneic and hypertensive but vitals otherwise normal.  Does report fevers yesterday at home.  Was seen at urgent care and started on prednisone and antibiotics, but is since developed chest pain and worsening discomfort over the right jaw.  Known history of Sjogren's, but has never developed chest pain in association with her parotitis and jaw swelling.  No prior history of cardiac issues.  Reports pain over the left chest is worse with movement and palpation as well as with deep breathing.  She reports associated shortness of breath some intermittent coughing.  No abdominal pain, nausea or vomiting.  Atypical presentation with likely parotitis which is consistent with patient's Sjogren's which she has had intermittent flares with, but chest pain seems new and unrelated.  It is reproducible on exam but is also pleuritic in nature and given patient's inflammatory state this could increase her risk for PE, no prior history of blood clots.  Initial work-up began in triage, EKG without concerning ischemic changes and negative troponin, no leukocytosis, stable hemoglobin, no acute electrolyte derangements requiring intervention and normal renal function.  Negative pregnancy.  Chest x-ray shows a possible left lower lobe pneumonia with a mild opacity noted.  Case  discussed with Dr. Venora Maples, will proceed with CT soft tissue neck with contrast and a CT angios of the chest to better differentiate patient's symptoms.  If patient does have deep space infection related to parotitis she could have mediastinitis from this.  We will also treat patient's pain and she appears very uncomfortable on exam, morphine and Zofran ordered.  CT of the neck shows inflammation over the parotid and submandibular glands with no evidence of abscess or deep space infection.  This is likely in the setting of patient's Sjogren's.  A CT angios of the chest shows no evidence of pulmonary embolus.  It does show some interstitial opacities which could certainly be pneumonia, but given patient's history of autoimmune disease, interstitial lung disease should also be considered.  Will treat patient with Rocephin and doxycycline for pneumonia.  On reevaluation she has had significant improvement in her pain with treatment here in the emergency department and I suspect this pain is due to pleurisy from patient's pneumonia.  Patient ambulatory in the department with no hypoxia or tachycardia.  I feel she is stable for discharge home with continued treatment with doxycycline, will have her discontinue the prescribed penicillin.  We will have her follow closely with PCP.  Strict return precautions discussed.  Ibuprofen and Tylenol as needed for chest wall pain with tramadol for breakthrough pain.  At this time patient is stable for discharge home in good condition.  Final Clinical Impressions(s) / ED Diagnoses   Final diagnoses:  Community acquired pneumonia of left lower lobe of lung ([place])  Parotitis  Left-sided chest pain    ED Discharge Orders         Ordered    doxycycline (VIBRAMYCIN) 100 MG capsule  2 times daily,   Status:  Discontinued     04/15/18 0252    traMADol (ULTRAM) 50 MG tablet  Every 6 hours PRN     04/15/18 0252    doxycycline (VIBRAMYCIN)  100 MG capsule  2 times daily      04/15/18 0258           Janet Berlin 04/15/18 2395    Jola Schmidt, MD 04/15/18 [phone]

## 2018-04-14 NOTE — ED Triage Notes (Signed)
States taking pen vk yesterday for shoguns and right sided jaw swelling and chest pain since 2300pm last night with nausea and shortness of breath. Pain 10/10.

## 2018-04-15 ENCOUNTER — Other Ambulatory Visit: Payer: Self-pay

## 2018-04-15 ENCOUNTER — Telehealth: Payer: Self-pay

## 2018-04-15 ENCOUNTER — Other Ambulatory Visit: Payer: Self-pay | Admitting: Internal Medicine

## 2018-04-15 DIAGNOSIS — J189 Pneumonia, unspecified organism: Secondary | ICD-10-CM | POA: Diagnosis not present

## 2018-04-15 DIAGNOSIS — J4520 Mild intermittent asthma, uncomplicated: Secondary | ICD-10-CM

## 2018-04-15 MED ORDER — ALBUTEROL SULFATE HFA 108 (90 BASE) MCG/ACT IN AERS: 2.0000 | INHALATION_SPRAY | Freq: Two times a day (BID) | RESPIRATORY_TRACT | 0 refills | Status: AC

## 2018-04-15 MED ORDER — DOXYCYCLINE HYCLATE 100 MG PO CAPS: 100.0000 mg | ORAL_CAPSULE | Freq: Two times a day (BID) | ORAL | 0 refills | Status: DC

## 2018-04-15 MED ORDER — FLUTICASONE-SALMETEROL 100-50 MCG/DOSE IN AEPB: 1.0000 | INHALATION_SPRAY | Freq: Two times a day (BID) | RESPIRATORY_TRACT | 0 refills | Status: DC

## 2018-04-15 MED ORDER — SODIUM CHLORIDE 0.9 % IV SOLN
1.0000 g | Freq: Once | INTRAVENOUS | Status: AC
  Administered 2018-04-15: 1 g via INTRAVENOUS
  Filled 2018-04-15: qty 10

## 2018-04-15 MED ORDER — KETOROLAC TROMETHAMINE 30 MG/ML IJ SOLN
30.0000 mg | Freq: Once | INTRAMUSCULAR | Status: AC
  Administered 2018-04-15: 30 mg via INTRAVENOUS
  Filled 2018-04-15: qty 1

## 2018-04-15 MED ORDER — TRAMADOL HCL 50 MG PO TABS: 50.0000 mg | ORAL_TABLET | Freq: Four times a day (QID) | ORAL | 0 refills | Status: DC | PRN

## 2018-04-15 MED ORDER — DOXYCYCLINE HYCLATE 100 MG PO TABS
100.0000 mg | ORAL_TABLET | Freq: Once | ORAL | Status: AC
  Administered 2018-04-15: 100 mg via ORAL
  Filled 2018-04-15: qty 1

## 2018-04-15 NOTE — ED Notes (Addendum)
Pt. Ambulated down the hall and back to her room on 96% room air without difficulty. Pt. Gait steady on her feet. 

## 2018-04-15 NOTE — Telephone Encounter (Signed)
Patient has Pneumonia FU Apr 26 2018 having finished all ABX. She needs refills on Nebulizer Solution Albuterol and her inhaler Albuterol as well as Advair inhaler. Please send to [facility] [address] in [place] or call patient ONLY if unable to send refills. No call if sending meds as she has been instructed to check with pharmacy and to call and come in sooner if worsening though she already feels like she is getting better.

## 2018-04-15 NOTE — Discharge Instructions (Signed)
You have a left lower lobe pneumonia that is likely causing your chest discomfort.  Please take doxycycline twice daily as directed to treat pneumonia, discontinue penicillin but continue other medications prescribed by urgent care.  You can use Tylenol and ibuprofen every 6 hours for pain, tramadol as needed for breakthrough pain.  Your CT scan shows inflammation of your parotid and salivary gland which is to be expected with your Sjogren's, but it does not show any abscess or deep space infection.  I would like for you to follow-up closely with your primary care doctor in the next few days.  Return to the emergency department if you have worsening chest pain, shortness of breath, high fevers or any other new or concerning symptoms.  Your CT did not show signs of blood clot, it is most consistent with pneumonia but could also be interstitial lung disease and our radiologist recommend a repeat chest CT after you complete your antibiotics for evaluation of this, please discuss this with your primary care doctor.

## 2018-04-26 ENCOUNTER — Ambulatory Visit: Payer: BC Managed Care – PPO | Admitting: Internal Medicine

## 2018-04-26 ENCOUNTER — Ambulatory Visit
Admission: RE | Admit: 2018-04-26 | Discharge: 2018-04-26 | Disposition: A | Discharge status: Home / Self Care | Payer: BC Managed Care – PPO | Source: Ambulatory Visit | Attending: Internal Medicine | Admitting: Internal Medicine

## 2018-04-26 ENCOUNTER — Ambulatory Visit
Admission: RE | Admit: 2018-04-26 | Discharge: 2018-04-26 | Disposition: A | Discharge status: Home / Self Care | Payer: BC Managed Care – PPO | Attending: Internal Medicine | Admitting: Internal Medicine

## 2018-04-26 ENCOUNTER — Encounter: Payer: Self-pay | Admitting: Internal Medicine

## 2018-04-26 VITALS — BP 116/78 | HR 78 | Ht 70.0 in | Wt 162.8 lb

## 2018-04-26 DIAGNOSIS — J181 Lobar pneumonia, unspecified organism: Secondary | ICD-10-CM

## 2018-04-26 DIAGNOSIS — J189 Pneumonia, unspecified organism: Secondary | ICD-10-CM

## 2018-04-26 DIAGNOSIS — J4520 Mild intermittent asthma, uncomplicated: Secondary | ICD-10-CM | POA: Diagnosis not present

## 2018-04-26 MED ORDER — BUDESONIDE-FORMOTEROL FUMARATE 160-4.5 MCG/ACT IN AERO: 2.0000 | INHALATION_SPRAY | Freq: Two times a day (BID) | RESPIRATORY_TRACT | 3 refills | Status: DC

## 2018-04-26 NOTE — Progress Notes (Signed)
Date:  04/26/2018   Name:  Megan Reid   DOB:  [birth year]   MRN:  [id number]   Chief Complaint: Pneumonia (Follow up. )  Pneumonia  She complains of cough, shortness of breath and wheezing. Primary symptoms comments: Chest pain and cough - treated with antibiotics and prednisone. The problem has been rapidly improving. Pertinent negatives include no chest pain, ear pain, fever, headaches, sweats, trouble swallowing or weight loss.  Also had inflammation of her parotid glands which has improved back to her baseline status. She took 10 days of doxycycline which she has completed.  CXR - airspace disease at left lung base She had a CT of the neck - parotitis as above. CT chest PE protocol - left basilar infiltrate and haze ground glass appearance suggestive of autoimmune etiology.  Review of Systems  Constitutional: Negative for chills, diaphoresis, fatigue, fever and weight loss.  HENT: Negative for ear pain and trouble swallowing.   Respiratory: Positive for cough, shortness of breath and wheezing. Negative for chest tightness.   Cardiovascular: Negative for chest pain, palpitations and leg swelling.  Gastrointestinal: Negative for abdominal pain, diarrhea, nausea and vomiting.  Neurological: Negative for dizziness and headaches.    Patient Active Problem List   Diagnosis Date Noted  . Abrasion of right ankle 02/13/2017  . Rheumatoid factor positive 09/01/2016  . Leucopenia 09/01/2016  . Autoimmune disease (HCC) 02/14/2016  . Fatigue 02/14/2016  . DDD (degenerative disc disease), lumbar 02/14/2016  . Osteoarthritis of both knees 02/14/2016  . Osteoarthritis of both hands 02/14/2016  . Asthma 02/14/2016  . Migraines 02/14/2016  . Gastritis 02/14/2016  . Vitamin D deficiency 02/14/2016  . 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HCC) 02/14/2016  . High risk medication use 02/14/2016    Allergies  Allergen Reactions  . Strawberry Extract Other (See  Comments)    Unknown reaction  . Sulfa Antibiotics Anaphylaxis, Hives and Swelling    Swelling of the face and tongue     Past Surgical History:  Procedure Laterality Date  . TUBAL LIGATION      Social History   Tobacco Use  . Smoking status: Former Smoker    Packs/day: 0.50    Years: 2.00    Pack years: 1.00    Types: Cigarettes    Last attempt to quit: 05/16/1986    Years since quitting: 31.9  . Smokeless tobacco: Never Used  . Tobacco comment: TEEN AGER  Substance Use Topics  . Alcohol use: No  . Drug use: No     Medication list has been reviewed and updated.  Current Meds  Medication Sig  . albuterol (PROVENTIL HFA;VENTOLIN HFA) 108 (90 Base) MCG/ACT inhaler Inhale 2 puffs into the lungs 2 (two) times daily. For shortness of breath  . Fluticasone-Salmeterol (ADVAIR DISKUS) 100-50 MCG/DOSE AEPB Inhale 1 puff into the lungs 2 (two) times daily.  . traMADol (ULTRAM) 50 MG tablet Take 1 tablet (50 mg total) by mouth every 6 (six) hours as needed.    PHQ 2/9 Scores 02/13/2017  PHQ - 2 Score 0    Physical Exam Vitals signs and nursing note reviewed.  Constitutional:      General: She is not in acute distress.    Appearance: She is well-developed.  HENT:     Head: Normocephalic and atraumatic.  Eyes:     Extraocular Movements: Extraocular movements intact.     Pupils: Pupils are equal, round, and reactive to light.  Neck:     Musculoskeletal:  Normal range of motion.     Comments: Bilateral parotid gland enlargement c/w sjogren's disease Cardiovascular:     Rate and Rhythm: Normal rate and regular rhythm.     Pulses: Normal pulses.  Pulmonary:     Effort: Pulmonary effort is normal. No respiratory distress.     Breath sounds: Normal breath sounds.  Chest:     Chest wall: No tenderness.  Musculoskeletal: Normal range of motion.  Lymphadenopathy:     Cervical: No cervical adenopathy.  Skin:    General: Skin is warm and dry.     Findings: No rash.    Neurological:     Mental Status: She is alert and oriented to person, place, and time.  Psychiatric:        Behavior: Behavior normal.        Thought Content: Thought content normal.     BP 116/78 (BP Location: Right Arm, Patient Position: Sitting, Cuff Size: Normal)   Pulse 78   Ht 5\' 10"  (1.778 m)   Wt 162 lb 12.8 oz (73.8 kg)   LMP 05/22/2012   SpO2 98%   BMI 23.36 kg/m   Assessment and Plan: 1. Mild intermittent asthma without complication Switch from Advair to Symbicort daily Continue rescue albuterol MDI - budesonide-formoterol (SYMBICORT) 160-4.5 MCG/ACT inhaler; Inhale 2 puffs into the lungs 2 (two) times daily.  Dispense: 1 Inhaler; Refill: 3  2. Community acquired pneumonia of left lower lobe of lung (HCC) Repeat CXR Pt clinically much improved - will order CT if CXR is worse, otherwise will repeat CXR in 1 month - DG Chest 2 View; Future   Partially dictated using Animal nutritionist. Any errors are unintentional.  Bari Edward, MD [facility]  04/26/2018

## 2018-05-24 ENCOUNTER — Other Ambulatory Visit: Payer: Self-pay

## 2018-05-24 DIAGNOSIS — Z1231 Encounter for screening mammogram for malignant neoplasm of breast: Secondary | ICD-10-CM

## 2018-06-19 ENCOUNTER — Other Ambulatory Visit (HOSPITAL_COMMUNITY)
Admission: RE | Admit: 2018-06-19 | Discharge: 2018-06-19 | Disposition: A | Discharge status: Home / Self Care | Payer: BC Managed Care – PPO | Source: Ambulatory Visit | Attending: Obstetrics and Gynecology | Admitting: Obstetrics and Gynecology

## 2018-06-19 ENCOUNTER — Other Ambulatory Visit: Payer: Self-pay | Admitting: Obstetrics and Gynecology

## 2018-06-19 DIAGNOSIS — Z01419 Encounter for gynecological examination (general) (routine) without abnormal findings: Secondary | ICD-10-CM | POA: Insufficient documentation

## 2018-06-21 ENCOUNTER — Other Ambulatory Visit: Payer: Self-pay | Admitting: Obstetrics and Gynecology

## 2018-06-21 DIAGNOSIS — R52 Pain, unspecified: Secondary | ICD-10-CM

## 2018-06-24 LAB — CYTOLOGY - PAP: HPV: NOT DETECTED

## 2018-07-02 ENCOUNTER — Ambulatory Visit: Payer: BC Managed Care – PPO

## 2018-07-02 ENCOUNTER — Other Ambulatory Visit: Payer: Self-pay

## 2018-07-02 ENCOUNTER — Ambulatory Visit
Admission: RE | Admit: 2018-07-02 | Discharge: 2018-07-02 | Disposition: A | Discharge status: Home / Self Care | Payer: BC Managed Care – PPO | Source: Ambulatory Visit | Attending: Obstetrics and Gynecology | Admitting: Obstetrics and Gynecology

## 2018-07-02 DIAGNOSIS — R52 Pain, unspecified: Secondary | ICD-10-CM

## 2018-08-01 ENCOUNTER — Other Ambulatory Visit: Payer: Self-pay | Admitting: Obstetrics and Gynecology

## 2018-08-23 ENCOUNTER — Telehealth: Payer: Self-pay

## 2018-08-23 NOTE — Telephone Encounter (Signed)
-----   Message from Benjiman Core, MD sent at 08/23/2018  4:08 PM EDT ----- I agree. Thanks.  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 I instructed the patient to fax in her daughter's FMLA paperwork to be out of work as patient's caregiver and our FMLA nurse will review. Patient also has a deep, dry cough and fatigue x 1 week. Gave patient information for COVID 19 testing and instructed her to contact her PCP if symptoms persist. Patient was last seen by you 03/2018 and her next appointment is 10/09/18. Dorene Grebe

## 2018-09-30 ENCOUNTER — Other Ambulatory Visit: Payer: Self-pay | Admitting: Obstetrics and Gynecology

## 2018-10-09 ENCOUNTER — Inpatient Hospital Stay: Payer: BC Managed Care – PPO | Attending: Oncology | Admitting: Oncology

## 2018-10-09 ENCOUNTER — Telehealth: Payer: Self-pay | Admitting: Oncology

## 2018-10-09 ENCOUNTER — Other Ambulatory Visit: Payer: Self-pay

## 2018-10-09 ENCOUNTER — Inpatient Hospital Stay: Payer: BC Managed Care – PPO

## 2018-10-09 VITALS — BP 135/86 | HR 104 | Temp 98.6°F | Resp 18 | Ht 70.0 in | Wt 168.1 lb

## 2018-10-09 DIAGNOSIS — Z79899 Other long term (current) drug therapy: Secondary | ICD-10-CM | POA: Diagnosis not present

## 2018-10-09 DIAGNOSIS — Z7951 Long term (current) use of inhaled steroids: Secondary | ICD-10-CM | POA: Diagnosis not present

## 2018-10-09 DIAGNOSIS — D709 Neutropenia, unspecified: Secondary | ICD-10-CM

## 2018-10-09 DIAGNOSIS — D649 Anemia, unspecified: Secondary | ICD-10-CM

## 2018-10-09 DIAGNOSIS — D72819 Decreased white blood cell count, unspecified: Secondary | ICD-10-CM | POA: Diagnosis present

## 2018-10-09 DIAGNOSIS — R61 Generalized hyperhidrosis: Secondary | ICD-10-CM | POA: Insufficient documentation

## 2018-10-09 LAB — CBC WITH DIFFERENTIAL (CANCER CENTER ONLY)
Abs Immature Granulocytes: 0.01 10*3/uL (ref 0.00–0.07)
Basophils Absolute: 0 10*3/uL (ref 0.0–0.1)
Basophils Relative: 0 %
Eosinophils Absolute: 0 10*3/uL (ref 0.0–0.5)
Eosinophils Relative: 0 %
HCT: 31.1 % — ABNORMAL LOW (ref 36.0–46.0)
Hemoglobin: 9.5 g/dL — ABNORMAL LOW (ref 12.0–15.0)
Immature Granulocytes: 0 %
Lymphocytes Relative: 32 %
Lymphs Abs: 1.1 10*3/uL (ref 0.7–4.0)
MCH: 26.7 pg (ref 26.0–34.0)
MCHC: 30.5 g/dL (ref 30.0–36.0)
MCV: 87.4 fL (ref 80.0–100.0)
Monocytes Absolute: 0.4 10*3/uL (ref 0.1–1.0)
Monocytes Relative: 11 %
Neutro Abs: 2 10*3/uL (ref 1.7–7.7)
Neutrophils Relative %: 57 %
Platelet Count: 129 10*3/uL — ABNORMAL LOW (ref 150–400)
RBC: 3.56 MIL/uL — ABNORMAL LOW (ref 3.87–5.11)
RDW: 13.9 % (ref 11.5–15.5)
WBC Count: 3.6 10*3/uL — ABNORMAL LOW (ref 4.0–10.5)
nRBC: 0 % (ref 0.0–0.2)

## 2018-10-09 NOTE — Telephone Encounter (Signed)
Gave avs and calendar ° °

## 2018-10-09 NOTE — Progress Notes (Signed)
Hematology and Oncology Follow Up Visit  Megan Reid [id number] 02-19-1966 53 y.o. 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  Differential diagnoses related to reactive versus autoimmune phenomenon.    Prior Therapy: She is status post a bone marrow biopsy which did not show any evidence of dysplasia. PET CT scan in October 2015 did not show any evidence of lymphadenopathy.  Current therapy: Active surveillance.   Interim History:  Ms Megan Reid is here for return evaluation.  Since last visit, she reports no major changes in her health.  She underwent a LEEP procedure and she has developed vaginal bleeding afterwards.  She was evaluated by her gynecologist and her bleeding has stopped at this time.  She denies any excessive fatigue and tiredness.  She does report periodic cough and night sweats that has recurred as of late.  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  She denies any recent hospitalization or illnesses however.   She denied any alteration mental status, neuropathy, confusion or dizziness.  Denies any headaches or lethargy.  Denies any night sweats, weight loss or changes in appetite.  Denied orthopnea, dyspnea on exertion or chest discomfort.  Denies shortness of breath, difficulty breathing hemoptysis or cough.  Denies any abdominal distention, nausea, early satiety or dyspepsia.  Denies any hematuria, frequency, dysuria or nocturia.  Denies any skin irritation, dryness or rash.  Denies any ecchymosis or petechiae.  Denies any lymphadenopathy or clotting.  Denies any heat or cold intolerance.  Denies any anxiety or depression.  Remaining review of system is negative.      Medications: I have reviewed the patient's current medications.   Current Outpatient Medications  Medication Sig Dispense Refill  . albuterol (PROVENTIL HFA;VENTOLIN HFA) 108 (90 Base) MCG/ACT inhaler Inhale 2 puffs into the lungs 2  (two) times daily. For shortness of breath 18 g 0  . budesonide-formoterol (SYMBICORT) 160-4.5 MCG/ACT inhaler Inhale 2 puffs into the lungs 2 (two) times daily. 1 Inhaler 3  . traMADol (ULTRAM) 50 MG tablet Take 1 tablet (50 mg total) by mouth every 6 (six) hours as needed. 8 tablet 0   No current facility-administered medications for this visit.     Allergies:  Allergies  Allergen Reactions  . Strawberry Extract Other (See Comments)    Unknown reaction  . Sulfa Antibiotics Anaphylaxis, Hives and Swelling    Swelling of the face and tongue     Past Medical History, Surgical history, Social history, and Family History were reviewed and updated.   Physical Exam:  Blood pressure 135/86, pulse (!) 104, temperature 98.6 F (37 C), temperature source Oral, resp. rate 18, height 5' 10"  (1.778 m), weight 168 lb 1.6 oz (76.2 kg), last menstrual period 05/22/2012, SpO2 98 %.    ECOG: 1     General appearance: Alert, awake without any distress. Head: Atraumatic without abnormalities Oropharynx: Without any thrush or ulcers. Eyes: No scleral icterus. Lymph nodes: No lymphadenopathy noted in the cervical, supraclavicular, or axillary nodes Heart:regular rate and rhythm, without any murmurs or gallops.   Lung: Clear to auscultation without any rhonchi, wheezes or dullness to percussion. Abdomin: Soft, nontender without any shifting dullness or ascites. Musculoskeletal: No clubbing or cyanosis. Neurological: No motor or sensory deficits. Skin: No rashes or lesions.     Lab Results: Lab Results  Component Value Date   WBC 3.6 (L) 10/09/2018   HGB 9.5 (L) 10/09/2018   HCT 31.1 (L) 10/09/2018  MCV 87.4 10/09/2018   PLT 129 (L) 10/09/2018     Chemistry      Component Value Date/Time   NA 132 (L) 04/14/2018 2137   NA 142 09/29/2015   NA 139 04/13/2015 1323   K 3.6 04/14/2018 2137   K 4.2 04/13/2015 1323   CL 101 04/14/2018 2137   CL 104 03/23/2013 0101   CL 105  01/11/2012 1349   CO2 24 04/14/2018 2137   CO2 26 04/13/2015 1323   BUN 15 04/14/2018 2137   BUN 18 09/29/2015   BUN 15.9 04/13/2015 1323   CREATININE 0.82 04/14/2018 2137   CREATININE 0.89 10/30/2016 1046   CREATININE 0.9 04/13/2015 1323   GLU 92 09/29/2015      Component Value Date/Time   CALCIUM 9.2 04/14/2018 2137   CALCIUM 9.7 04/13/2015 1323   ALKPHOS 68 10/30/2016 1046   ALKPHOS 65 04/13/2015 1323   AST 33 10/30/2016 1046   AST 26 04/13/2015 1323   ALT 28 10/30/2016 1046   ALT 13 04/13/2015 1323   BILITOT 0.2 10/30/2016 1046   BILITOT 0.30 04/13/2015 1323        Impression and Plan:  53 year old woman with:   1. Leukocytopenia noted in 2013 and has been fluctuating since that time.  Leukocytopenia appears to be reactive and related to autoimmune disease.  Laboratory data from today reviewed with white cell count currently at 3.6 and normal differential.  Differential diagnosis was reviewed again and management options were discussed.  At this time of recommended continuing active surveillance without any intervention.  2.  Anemia: Mostly related to recent blood loss from vaginal source.  Her bleeding had stopped at this time.  3.  Age-appropriate cancer screening: She is up-to-date with mammography and pelvic exam.  4.  Night sweats: Does not appear to be related to a lymphoproliferative disorder at this time.  5.  Follow-up: Will be in 6 months.  15  minutes was spent with the patient face-to-face today.  More than 50% of time was dedicated to reviewing her disease status, laboratory data review and answering questions regarding future plan of care.   Megan Button, MD 6/24/20203:38 PM

## 2018-11-01 ENCOUNTER — Telehealth: Payer: Self-pay

## 2018-11-01 NOTE — Telephone Encounter (Signed)
Received call from the patient stating that she has a form to be filled out by Dr. Alen Blew which she has to return to the [employer] office by 7/22. She stated that the form will allow for accommodations with COVID and retuning to school if medical diagnosis deems appropriate. Provided the fax number to the office and patient requested that she be called once completed so she can pick up.

## 2018-11-04 ENCOUNTER — Telehealth: Payer: Self-pay

## 2018-11-04 NOTE — Telephone Encounter (Signed)
Contacted patient and made aware that requested forms have been filled out and signed by MD and are ready for pick up. Patient verbalized understanding and appreciation and had no other questions or concerns.

## 2018-11-20 ENCOUNTER — Other Ambulatory Visit: Payer: Self-pay

## 2018-11-20 DIAGNOSIS — J4520 Mild intermittent asthma, uncomplicated: Secondary | ICD-10-CM

## 2018-11-20 MED ORDER — MONTELUKAST SODIUM 10 MG PO TABS: 10.0000 mg | ORAL_TABLET | Freq: Every evening | ORAL | 5 refills | Status: DC

## 2019-02-07 ENCOUNTER — Other Ambulatory Visit: Payer: Self-pay

## 2019-02-07 ENCOUNTER — Ambulatory Visit (INDEPENDENT_AMBULATORY_CARE_PROVIDER_SITE_OTHER): Payer: BC Managed Care – PPO | Admitting: Pulmonary Disease

## 2019-02-07 ENCOUNTER — Encounter: Payer: Self-pay | Admitting: Pulmonary Disease

## 2019-02-07 VITALS — BP 122/80 | HR 88 | Temp 97.1°F | Ht 70.0 in | Wt 171.0 lb

## 2019-02-07 DIAGNOSIS — Z23 Encounter for immunization: Secondary | ICD-10-CM | POA: Diagnosis not present

## 2019-02-07 DIAGNOSIS — J4541 Moderate persistent asthma with (acute) exacerbation: Secondary | ICD-10-CM

## 2019-02-07 DIAGNOSIS — R0602 Shortness of breath: Secondary | ICD-10-CM | POA: Diagnosis not present

## 2019-02-07 LAB — CBC WITH DIFFERENTIAL/PLATELET
Basophils Absolute: 0 10*3/uL (ref 0.0–0.1)
Basophils Relative: 0.7 % (ref 0.0–3.0)
Eosinophils Absolute: 0 10*3/uL (ref 0.0–0.7)
Eosinophils Relative: 0.2 % (ref 0.0–5.0)
HCT: 32.2 % — ABNORMAL LOW (ref 36.0–46.0)
Hemoglobin: 10.5 g/dL — ABNORMAL LOW (ref 12.0–15.0)
Lymphocytes Relative: 39.7 % (ref 12.0–46.0)
Lymphs Abs: 0.9 10*3/uL (ref 0.7–4.0)
MCHC: 32.6 g/dL (ref 30.0–36.0)
MCV: 83.9 fl (ref 78.0–100.0)
Monocytes Absolute: 0.2 10*3/uL (ref 0.1–1.0)
Monocytes Relative: 11 % (ref 3.0–12.0)
Neutro Abs: 1 10*3/uL — ABNORMAL LOW (ref 1.4–7.7)
Neutrophils Relative %: 48.4 % (ref 43.0–77.0)
Platelets: 140 10*3/uL — ABNORMAL LOW (ref 150.0–400.0)
RBC: 3.84 Mil/uL — ABNORMAL LOW (ref 3.87–5.11)
RDW: 14 % (ref 11.5–15.5)
WBC: 2.2 10*3/uL — ABNORMAL LOW (ref 4.0–10.5)

## 2019-02-07 MED ORDER — PANTOPRAZOLE SODIUM 40 MG PO TBEC: 40.0000 mg | DELAYED_RELEASE_TABLET | Freq: Two times a day (BID) | ORAL | 2 refills | Status: DC

## 2019-02-07 MED ORDER — FLUTICASONE PROPIONATE 50 MCG/ACT NA SUSP: 2.0000 | Freq: Every day | NASAL | 2 refills | Status: DC

## 2019-02-07 NOTE — Progress Notes (Signed)
Megan Reid    [id number]    16-Apr-1966  Primary Care Physician:Berglund, Jesse Sans, MD  Referring Physician: Glean Hess, MD [address] [place],   [address]  Chief complaint: Consult for asthma  HPI: 53 year old with history of asthma, autoimmune disease with rheumatoid arthritis, Sjogren's syndrome  Previously followed by Dr. Estanislado Pandy and rheumatology at [facility].  She was on plaquenil, methotrexate and folic acid, prednisone.  She has not followed up with rheumatology since 2018 and is currently not on treatment.  Last rheumatology note was from [facility] in 2018 where they discussed possibly needing leflunomide or CellCept.  But she has not followed back since the co-pay was high at [facility] She had a high-res CT in 2018 with no evidence of interstitial lung disease.  CTA in December 2019 showed some groundglass opacities at the bases but no follow-up CT was done  Has history of asthma for which she is on Symbicort but is using it 2 puffs once a day.  Also has significant allergic rhinitis, GERD which is untreated at present.  Pets: Has a cat.  No birds, farm animal Occupation: Pharmacist, hospital for fourth grade Exposures: Reports mold exposure at school but currently she is working remotely from home.  She also has a comfortable.  No hot tubs, Jacuzzi's, humidifier Smoking history: Remote smoking history as a teenager Travel history: Previously lived in [place], [place].  She has been in [place] for the past 7 years.  No significant recent travel Relevant family history: Father had emphysema.  He was a smoker.  Outpatient Encounter Medications as of 02/07/2019  Medication Sig  . albuterol (PROVENTIL HFA;VENTOLIN HFA) 108 (90 Base) MCG/ACT inhaler Inhale 2 puffs into the lungs 2 (two) times daily. For shortness of breath  . budesonide-formoterol (SYMBICORT) 160-4.5 MCG/ACT inhaler Inhale 2 puffs into the lungs 2 (two) times daily.  . montelukast  (SINGULAIR) 10 MG tablet Take 1 tablet (10 mg total) by mouth every evening.  . [DISCONTINUED] traMADol (ULTRAM) 50 MG tablet Take 1 tablet (50 mg total) by mouth every 6 (six) hours as needed.   No facility-administered encounter medications on file as of 02/07/2019.     Allergies as of 02/07/2019 - Review Complete 02/07/2019  Allergen Reaction Noted  . Strawberry extract Other (See Comments) 04/12/2012  . Sulfa antibiotics Anaphylaxis, Hives, and Swelling 12/06/2011    Past Medical History:  Diagnosis Date  . Arthralgia 02/14/2016  . Asthma   . Autoimmune disease ([place]) 02/14/2016   Positive RF 139 CCP Negative  Positive ANA 1:640 Positive Ro Positive La Elevated ESR 105 - Sjorgen's Disease  . DDD (degenerative disc disease), lumbar 02/14/2016  . Fatigue 02/14/2016  . Gastritis 02/14/2016  . Hot flashes   . Migraines 02/14/2016  . Osteoarthritis of both hands 02/14/2016  . Osteoarthritis of both knees 02/14/2016  . Vitamin D deficiency 02/14/2016    Past Surgical History:  Procedure Laterality Date  . BREAST BIOPSY Left   . TUBAL LIGATION      Family History  Problem Relation Age of Onset  . Kidney failure Mother   . Heart failure Mother   . Lupus Mother   . COPD Father   . Hypertension Sister   . Acromegaly Sister   . Breast cancer Paternal Aunt 48    Social History   Socioeconomic History  . Marital status: Married    Spouse name: Not on file  . Number of  children: 4  . Years of education: 56  . Highest education level: Not on file  Occupational History  . Occupation: Pharmacist, hospital  Social Needs  . Financial resource strain: Not on file  . Food insecurity    Worry: Not on file    Inability: Not on file  . Transportation needs    Medical: Not on file    Non-medical: Not on file  Tobacco Use  . Smoking status: Former Smoker    Packs/day: 0.50    Years: 2.00    Pack years: 1.00    Types: Cigarettes    Quit date: 05/16/1986    Years since quitting: 32.7   . Smokeless tobacco: Never Used  . Tobacco comment: TEEN AGER  Substance and Sexual Activity  . Alcohol use: No  . Drug use: No  . Sexual activity: Not on file  Lifestyle  . Physical activity    Days per week: Not on file    Minutes per session: Not on file  . Stress: Not on file  Relationships  . Social Herbalist on phone: Not on file    Gets together: Not on file    Attends religious service: Not on file    Active member of club or organization: Not on file    Attends meetings of clubs or organizations: Not on file    Relationship status: Not on file  . Intimate partner violence    Fear of current or ex partner: Not on file    Emotionally abused: Not on file    Physically abused: Not on file    Forced sexual activity: Not on file  Other Topics Concern  . Not on file  Social History Narrative   Lives with husband in a 2 story home.  Has 4 children.  Teacher.  Education: college.     Review of systems: Review of Systems  Constitutional: Negative for fever and chills.  HENT: Negative.   Eyes: Negative for blurred vision.  Respiratory: as per HPI  Cardiovascular: Negative for chest pain and palpitations.  Gastrointestinal: Negative for vomiting, diarrhea, blood per rectum. Genitourinary: Negative for dysuria, urgency, frequency and hematuria.  Musculoskeletal: Negative for myalgias, back pain and joint pain.  Skin: Negative for itching and rash.  Neurological: Negative for dizziness, tremors, focal weakness, seizures and loss of consciousness.  Endo/Heme/Allergies: Negative for environmental allergies.  Psychiatric/Behavioral: Negative for depression, suicidal ideas and hallucinations.  All other systems reviewed and are negative.  Physical Exam: Blood pressure 122/80, pulse 88, temperature (!) 97.1 F (36.2 C), temperature source Temporal, height 5' 10"  (1.778 m), weight 171 lb (77.6 kg), last menstrual period 05/22/2012, SpO2 99 %. Gen:      No acute  distress HEENT:  EOMI, sclera anicteric Neck:     No masses; no thyromegaly Lungs:    Clear to auscultation bilaterally; normal respiratory effort CV:         Regular rate and rhythm; no murmurs Abd:      + bowel sounds; soft, non-tender; no palpable masses, no distension Ext:    No edema; adequate peripheral perfusion Skin:      Warm and dry; no rash Neuro: alert and oriented x 3 Psych: normal mood and affect  Data Reviewed: Imaging: CT high-resolution 09/21/2016-biapical scarring.  No evidence of interstitial lung disease.  4 mm nodule in the left major fissure. CTA 04/14/2018 no PE, patchy groundglass opacities and reticulation in the mid to lower lungs. Chest x-ray 04/26/2018-no active  cardiopulmonary disease I have reviewed the images personally.  Labs: CBC 10/09/2018-WBC 3.6, hemoglobin 9.5, platelets 129, eos 0%  Assessment:  Asthma Has worsening symptoms with cough for the past-year-old.  She will need evaluation with CBC, respiratory allergy profile, PFTs She is underdosing her Symbicort.  I have asked her to use 2 puffs twice daily.  Exposure history notable for down comforters.  I have advised her to get her doctors. We will review presence of interstitial lung disease CT scan.  Order hypersensitivity panel serologies  Allergic rhinitis, GERD Has significant issues with rhinitis, GERD We will start Flonase, chlorphentermine and Protonix twice daily  Autoimmune disease, arthritis, Sjogren's syndrome Her last imaging was in December 2019 with groundglass opacities at the bases.  She will need follow-up to make sure there is no underlying interstitial lung disease Order high-resolution CT. Advised her to follow back with Dr. Estanislado Pandy, rheumatology since she has not been seen since 2018  Plan/Recommendations: - CBC, allergy panel, sensory panel - Get rid of down comforter - High-res CT, PFTs - Increase Symbicort to twice daily - Flonase, chlorphentermine - Protonix  twice daily  Marshell Garfinkel MD [facility] 02/07/2019, 9:20 AM  CC: Glean Hess, MD

## 2019-02-07 NOTE — Patient Instructions (Signed)
We will get some labs today including CBC differential, respiratory allergy profile, hypersensitivity panel We will schedule you for high-resolution CT and pulmonary function tests  Increase the Symbicort to 2 puffs twice daily Start you on Flonase nasal spray and over-the-counter chlorpheniramine 8 mg 3 times daily Start on Protonix 40 mg twice daily  We will give you a flu shot today Follow-up in 1 to 2 months.

## 2019-02-10 LAB — RESPIRATORY ALLERGY PROFILE REGION II ~~LOC~~
Allergen, A. alternata, m6: <0.1 kU/L
Allergen, Cedar tree, t12: <0.1 kU/L
Allergen, Comm Silver Birch, t9: <0.1 kU/L
Allergen, Cottonwood, t14: <0.1 kU/L
Allergen, D pternoyssinus,d7: 0.4 kU/L — ABNORMAL HIGH
Allergen, Mouse Urine Protein, e78: <0.1 kU/L
Allergen, Mulberry, t76: 0.11 kU/L — ABNORMAL HIGH
Allergen, Oak,t7: <0.1 kU/L
Allergen, P. notatum, m1: <0.1 kU/L
Aspergillus fumigatus, m3: 0.11 kU/L — ABNORMAL HIGH
Bermuda Grass: <0.1 kU/L
Box Elder IgE: <0.1 kU/L
CLADOSPORIUM HERBARUM (M2) IGE: 0.15 kU/L — ABNORMAL HIGH
COMMON RAGWEED (SHORT) (W1) IGE: <0.1 kU/L
Cat Dander: <0.1 kU/L
Class: 0
Class: 0
Class: 0
Class: 0
Class: 0
Class: 0
Class: 0
Class: 0
Class: 0
Class: 0
Class: 0
Class: 0
Class: 0
Class: 0
Class: 0
Class: 0
Class: 0
Class: 0
Class: 0
Class: 0
Class: 0
Class: 1
Class: 1
Class: 1
Cockroach: <0.1 kU/L
D. farinae: 0.44 kU/L — ABNORMAL HIGH
Dog Dander: 0.46 kU/L — ABNORMAL HIGH
Elm IgE: <0.1 kU/L
IgE (Immunoglobulin E), Serum: 1567 kU/L — ABNORMAL HIGH (ref <=114)
Johnson Grass: 0.13 kU/L — ABNORMAL HIGH
Pecan/Hickory Tree IgE: <0.1 kU/L
Rough Pigweed  IgE: <0.1 kU/L
Sheep Sorrel IgE: <0.1 kU/L
Timothy Grass: <0.1 kU/L

## 2019-02-10 LAB — INTERPRETATION:

## 2019-02-11 ENCOUNTER — Ambulatory Visit (INDEPENDENT_AMBULATORY_CARE_PROVIDER_SITE_OTHER)
Admission: RE | Admit: 2019-02-11 | Discharge: 2019-02-11 | Disposition: A | Discharge status: Home / Self Care | Payer: BC Managed Care – PPO | Source: Ambulatory Visit | Attending: Pulmonary Disease | Admitting: Pulmonary Disease

## 2019-02-11 ENCOUNTER — Other Ambulatory Visit: Payer: Self-pay

## 2019-02-11 DIAGNOSIS — R0602 Shortness of breath: Secondary | ICD-10-CM

## 2019-02-11 DIAGNOSIS — J4541 Moderate persistent asthma with (acute) exacerbation: Secondary | ICD-10-CM

## 2019-02-12 LAB — HYPERSENSITIVITY PNEUMONITIS
A. Pullulans Abs: NEGATIVE
A.Fumigatus #1 Abs: NEGATIVE
Micropolyspora faeni, IgG: NEGATIVE
Pigeon Serum Abs: NEGATIVE
Thermoact. Saccharii: NEGATIVE
Thermoactinomyces vulgaris, IgG: NEGATIVE

## 2019-04-05 ENCOUNTER — Other Ambulatory Visit (HOSPITAL_COMMUNITY)
Admission: RE | Admit: 2019-04-05 | Discharge: 2019-04-05 | Disposition: A | Discharge status: Home / Self Care | Payer: BC Managed Care – PPO | Source: Ambulatory Visit | Attending: Pulmonary Disease | Admitting: Pulmonary Disease

## 2019-04-05 ENCOUNTER — Other Ambulatory Visit (HOSPITAL_COMMUNITY): Payer: BC Managed Care – PPO

## 2019-04-05 DIAGNOSIS — Z01812 Encounter for preprocedural laboratory examination: Secondary | ICD-10-CM | POA: Diagnosis not present

## 2019-04-05 DIAGNOSIS — Z20828 Contact with and (suspected) exposure to other viral communicable diseases: Secondary | ICD-10-CM | POA: Diagnosis not present

## 2019-04-06 LAB — NOVEL CORONAVIRUS, NAA (HOSP ORDER, SEND-OUT TO REF LAB; TAT 18-24 HRS): SARS-CoV-2, NAA: NOT DETECTED

## 2019-04-09 ENCOUNTER — Other Ambulatory Visit: Payer: Self-pay

## 2019-04-09 ENCOUNTER — Ambulatory Visit (INDEPENDENT_AMBULATORY_CARE_PROVIDER_SITE_OTHER): Payer: BC Managed Care – PPO | Admitting: Pulmonary Disease

## 2019-04-09 DIAGNOSIS — R0602 Shortness of breath: Secondary | ICD-10-CM

## 2019-04-09 DIAGNOSIS — J4541 Moderate persistent asthma with (acute) exacerbation: Secondary | ICD-10-CM

## 2019-04-09 LAB — PULMONARY FUNCTION TEST
DL/VA % pred: 87 %
DL/VA: 3.59 ml/min/mmHg/L
DLCO unc % pred: 69 %
DLCO unc: 17.46 ml/min/mmHg
FEF 25-75 Post: 2.45 L/sec
FEF 25-75 Pre: 1.85 L/sec
FEF2575-%Change-Post: 32 %
FEF2575-%Pred-Post: 89 %
FEF2575-%Pred-Pre: 67 %
FEV1-%Change-Post: 10 %
FEV1-%Pred-Post: 97 %
FEV1-%Pred-Pre: 88 %
FEV1-Post: 2.73 L
FEV1-Pre: 2.47 L
FEV1FVC-%Change-Post: 7 %
FEV1FVC-%Pred-Pre: 87 %
FEV6-%Change-Post: 3 %
FEV6-%Pred-Post: 105 %
FEV6-%Pred-Pre: 101 %
FEV6-Post: 3.6 L
FEV6-Pre: 3.49 L
FEV6FVC-%Pred-Post: 102 %
FEV6FVC-%Pred-Pre: 102 %
FVC-%Change-Post: 3 %
FVC-%Pred-Post: 102 %
FVC-%Pred-Pre: 99 %
FVC-Post: 3.6 L
FVC-Pre: 3.49 L
Post FEV1/FVC ratio: 76 %
Post FEV6/FVC ratio: 100 %
Pre FEV1/FVC ratio: 71 %
Pre FEV6/FVC Ratio: 100 %
RV % pred: 69 %
RV: 1.51 L
TLC % pred: 85 %
TLC: 5.09 L

## 2019-04-09 NOTE — Progress Notes (Signed)
Full PFT performed today. °

## 2019-04-14 ENCOUNTER — Other Ambulatory Visit: Payer: Self-pay

## 2019-04-14 DIAGNOSIS — D709 Neutropenia, unspecified: Secondary | ICD-10-CM

## 2019-04-15 ENCOUNTER — Other Ambulatory Visit: Payer: Self-pay

## 2019-04-15 ENCOUNTER — Inpatient Hospital Stay: Payer: BC Managed Care – PPO | Attending: Oncology | Admitting: Oncology

## 2019-04-15 ENCOUNTER — Inpatient Hospital Stay: Payer: BC Managed Care – PPO

## 2019-04-15 VITALS — BP 134/91 | HR 104 | Temp 98.2°F | Resp 18 | Ht 70.0 in | Wt 172.6 lb

## 2019-04-15 DIAGNOSIS — Z7951 Long term (current) use of inhaled steroids: Secondary | ICD-10-CM | POA: Diagnosis not present

## 2019-04-15 DIAGNOSIS — D709 Neutropenia, unspecified: Secondary | ICD-10-CM

## 2019-04-15 DIAGNOSIS — Z79899 Other long term (current) drug therapy: Secondary | ICD-10-CM | POA: Insufficient documentation

## 2019-04-15 DIAGNOSIS — D649 Anemia, unspecified: Secondary | ICD-10-CM | POA: Diagnosis not present

## 2019-04-15 DIAGNOSIS — D72819 Decreased white blood cell count, unspecified: Secondary | ICD-10-CM | POA: Diagnosis not present

## 2019-04-15 LAB — CBC WITH DIFFERENTIAL (CANCER CENTER ONLY)
Abs Immature Granulocytes: 0.01 10*3/uL (ref 0.00–0.07)
Basophils Absolute: 0 10*3/uL (ref 0.0–0.1)
Basophils Relative: 0 %
Eosinophils Absolute: 0 10*3/uL (ref 0.0–0.5)
Eosinophils Relative: 0 %
HCT: 34.5 % — ABNORMAL LOW (ref 36.0–46.0)
Hemoglobin: 10.8 g/dL — ABNORMAL LOW (ref 12.0–15.0)
Immature Granulocytes: 0 %
Lymphocytes Relative: 31 %
Lymphs Abs: 1.2 10*3/uL (ref 0.7–4.0)
MCH: 27.1 pg (ref 26.0–34.0)
MCHC: 31.3 g/dL (ref 30.0–36.0)
MCV: 86.5 fL (ref 80.0–100.0)
Monocytes Absolute: 0.2 10*3/uL (ref 0.1–1.0)
Monocytes Relative: 6 %
Neutro Abs: 2.4 10*3/uL (ref 1.7–7.7)
Neutrophils Relative %: 63 %
Platelet Count: 127 10*3/uL — ABNORMAL LOW (ref 150–400)
RBC: 3.99 MIL/uL (ref 3.87–5.11)
RDW: 15 % (ref 11.5–15.5)
WBC Count: 3.8 10*3/uL — ABNORMAL LOW (ref 4.0–10.5)
nRBC: 0 % (ref 0.0–0.2)

## 2019-04-15 NOTE — Progress Notes (Signed)
Hematology and Oncology Follow Up Visit  Megan Reid [id number] July 30, 1965 53 y.o. 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     Prior Therapy: She is status post a bone marrow biopsy which did not show any evidence of dysplasia. PET CT scan in October 2015 did not show any evidence of lymphadenopathy.  Current therapy: Active surveillance.   Interim History:  Megan Reid is here for return evaluation and follow-up.  Since the last visit, she reports increase in her joint stiffness predominantly in her wrists and fingers as well as knee pain.  Despite that she still able to ambulate and attends to most activities of daily living.  She denies any recent fevers or chills.  She denies any constitutional symptoms.  She denies any recent hospitalizations.  She denies any bleeding or thrombosis episodes.   Patient denied headaches, blurry vision, syncope or seizures.  Denies any fevers, chills or sweats.  Denied chest pain, palpitation, orthopnea or leg edema.  Denied cough, wheezing or hemoptysis.  Denied nausea, vomiting or abdominal pain.  Denies any constipation or diarrhea.  Denies any frequency urgency or hesitancy.  Denies any arthralgias or myalgias.  Denies any skin rashes or lesions.  Denies any bleeding or clotting tendency.  Denies any easy bruising.  Denies any hair or nail changes.  Denies any anxiety or depression.  Remaining review of system is negative.       Medications: Reviewed without any changes.  Current Outpatient Medications  Medication Sig Dispense Refill  . albuterol (PROVENTIL HFA;VENTOLIN HFA) 108 (90 Base) MCG/ACT inhaler Inhale 2 puffs into the lungs 2 (two) times daily. For shortness of breath 18 g 0  . budesonide-formoterol (SYMBICORT) 160-4.5 MCG/ACT inhaler Inhale 2 puffs into the lungs 2 (two) times daily. 1 Inhaler 3  . fluticasone  (FLONASE) 50 MCG/ACT nasal spray Place 2 sprays into both nostrils daily. 16 g 2  . montelukast (SINGULAIR) 10 MG tablet Take 1 tablet (10 mg total) by mouth every evening. 30 tablet 5  . pantoprazole (PROTONIX) 40 MG tablet Take 1 tablet (40 mg total) by mouth 2 (two) times daily. 60 tablet 2   No current facility-administered medications for this visit.    Allergies:  Allergies  Allergen Reactions  . Strawberry Extract Other (See Comments)    Unknown reaction  . Sulfa Antibiotics Anaphylaxis, Hives and Swelling    Swelling of the face and tongue     Past Medical History, Surgical history, Social history, and Family History updated without any changes.   Physical Exam:  Blood pressure (!) 134/91, pulse (!) 104, temperature 98.2 F (36.8 C), temperature source Temporal, resp. rate 18, height 5' 10"  (1.778 m), weight 172 lb 9.6 oz (78.3 kg), last menstrual period 05/22/2012, SpO2 99 %.    ECOG: 1    General appearance: Comfortable appearing without any discomfort Head: Normocephalic without any trauma Oropharynx: Mucous membranes are moist and pink without any thrush or ulcers. Eyes: Pupils are equal and round reactive to light. Lymph nodes: No cervical, supraclavicular, inguinal or axillary lymphadenopathy.   Heart:regular rate and rhythm.  S1 and S2 without leg edema. Lung: Clear without any rhonchi or wheezes.  No dullness to percussion. Abdomin: Soft, nontender, nondistended with good bowel sounds.  No hepatosplenomegaly. Musculoskeletal: No joint deformity or effusion.  Full range of motion noted. Neurological: No deficits noted on motor, sensory and deep tendon reflex exam. Skin:  No petechial rash or dryness.  Appeared moist.       Lab Results: Lab Results  Component Value Date   WBC 3.8 (L) 04/15/2019   HGB 10.8 (L) 04/15/2019   HCT 34.5 (L) 04/15/2019   MCV 86.5 04/15/2019   PLT 127 (L) 04/15/2019     Chemistry      Component Value Date/Time   NA 132  (L) 04/14/2018 2137   NA 142 09/29/2015 0000   NA 139 04/13/2015 1323   K 3.6 04/14/2018 2137   K 4.2 04/13/2015 1323   CL 101 04/14/2018 2137   CL 104 03/23/2013 0101   CL 105 01/11/2012 1349   CO2 24 04/14/2018 2137   CO2 26 04/13/2015 1323   BUN 15 04/14/2018 2137   BUN 18 09/29/2015 0000   BUN 15.9 04/13/2015 1323   CREATININE 0.82 04/14/2018 2137   CREATININE 0.89 10/30/2016 1046   CREATININE 0.9 04/13/2015 1323   GLU 92 09/29/2015 0000      Component Value Date/Time   CALCIUM 9.2 04/14/2018 2137   CALCIUM 9.7 04/13/2015 1323   ALKPHOS 68 10/30/2016 1046   ALKPHOS 65 04/13/2015 1323   AST 33 10/30/2016 1046   AST 26 04/13/2015 1323   ALT 28 10/30/2016 1046   ALT 13 04/13/2015 1323   BILITOT 0.2 10/30/2016 1046   BILITOT 0.30 04/13/2015 1323        Impression and Plan:  53 year old woman with:   1.  Thrombocytopenia with leukocytopenia diagnosed in 2013.  Differential diagnosis include autoimmune etiologies versus medication related.    Laboratory data from today reviewed and showed improved total white cell count and platelet count that is overall stable.  I do not see any evidence to suggest lymphoproliferative disorder or malignancy.  I recommended continued active surveillance at this time with a trial of prednisone if needed in the future she develops worsening thrombocytopenia.   2.  Anemia: Hemoglobin relatively stable without any further decline.  No intervention is needed at this time.  3.  COVID-19 considerations: She had a a lot of questions regarding her autoimmune disease and risk of developing severe illness associated with this virus.  She certainly would be at high risk given her immunosuppressive agents in the past and would be reasonable to consider vaccination.  I also urged her to discuss this with her rheumatologist.   4.  Follow-up: Will be in 6 months.  15  minutes was spent with the patient face-to-face today.  More than 50% of time was  spent on updating her disease status, reviewing laboratory data as well as answering questions regarding future plan of care.   Zola Button, MD 12/29/202012:14 PM

## 2019-04-16 ENCOUNTER — Telehealth: Payer: Self-pay | Admitting: Oncology

## 2019-04-16 ENCOUNTER — Other Ambulatory Visit: Payer: Self-pay | Admitting: Internal Medicine

## 2019-04-16 DIAGNOSIS — J4520 Mild intermittent asthma, uncomplicated: Secondary | ICD-10-CM

## 2019-04-16 NOTE — Telephone Encounter (Signed)
No los per 12/29. 

## 2019-04-23 ENCOUNTER — Ambulatory Visit: Payer: Self-pay

## 2019-04-23 ENCOUNTER — Ambulatory Visit: Payer: BC Managed Care – PPO | Admitting: Orthopedic Surgery

## 2019-04-23 ENCOUNTER — Other Ambulatory Visit: Payer: Self-pay

## 2019-04-23 ENCOUNTER — Encounter: Payer: Self-pay | Admitting: Orthopedic Surgery

## 2019-04-23 ENCOUNTER — Ambulatory Visit (INDEPENDENT_AMBULATORY_CARE_PROVIDER_SITE_OTHER): Payer: BC Managed Care – PPO

## 2019-04-23 DIAGNOSIS — M79602 Pain in left arm: Secondary | ICD-10-CM | POA: Diagnosis not present

## 2019-04-23 DIAGNOSIS — M79601 Pain in right arm: Secondary | ICD-10-CM

## 2019-04-23 DIAGNOSIS — M25511 Pain in right shoulder: Secondary | ICD-10-CM

## 2019-04-25 ENCOUNTER — Encounter: Payer: Self-pay | Admitting: Orthopedic Surgery

## 2019-04-25 NOTE — Progress Notes (Signed)
Office Visit Note   Patient: Megan Reid           Date of Birth: 24-Aug-1965           MRN: [id number] Visit Date: 04/23/2019 Requested by: Glean Hess, MD [address] [place],  [place] [address] PCP: Glean Hess, MD  Subjective: Chief Complaint  Patient presents with  . Right Shoulder - Pain  . Left Shoulder - Pain  . Neck - Pain    HPI: Megan Reid is a patient with bilateral shoulder pain and neck pain.  Right shoulder hurts worse than the left shoulder.  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  She also reports some neck pain with her head feeling heavy at times.  At times her shoulders get stiff and stuck more on the right than the left.  She also reports some radicular pain involving the right and left arm but no numbness and tingling.  The pain does not wake her from sleep.  She is right-hand dominant.  Takes bare aspirin as needed.  She states that the right shoulder gets stuck when she is trying to move it.  Hard for her to lift the arm at those times.  She works as a Pharmacist, hospital.  She does have a history of autoimmune disease including rheumatoid arthritis as well as low white count.  She also reports bilateral foot numbness which we will address at a later time.              ROS: All systems reviewed are negative as they relate to the chief complaint within the history of present illness.  Patient denies  fevers or chills.   Assessment & Plan: Visit Diagnoses:  1. Bilateral arm pain   2. Right shoulder pain, unspecified chronicity     Plan: Impression is right shoulder posttraumatic deformity with definite mechanical symptoms and possible upper portion subscap tear with biceps tendon instability.  Left shoulder exam is a little bit more reasonable with no rotator cuff weakness.  Cervical spine exam also normal.  She does have a little bit of posttraumatic  deformity in the humeral head and neck region which could long-term have contributed to mechanical/structural problem in the shoulder joint.  Plan MRI arthrogram to evaluate rotator cuff tear.  We will see her back after that study.  No real evidence of myelopathy at this time and the bilateral foot numbness could be related to some of her other medical conditions.  Follow-Up Instructions: Return for after MRI.   Orders:  Orders Placed This Encounter  Procedures  . XR Shoulder Right  . XR Shoulder Left  . XR Cervical Spine 2 or 3 views  . Arthrogram  . MR SHOULDER RIGHT W CONTRAST   No orders of the defined types were placed in this encounter.     Procedures: No procedures performed   Clinical Data: No additional findings.  Objective: Vital Signs: LMP 05/22/2012   Physical Exam:   Constitutional: Patient appears well-developed HEENT:  Head: Normocephalic Eyes:EOM are normal Neck: Normal range of motion Cardiovascular: Normal rate Pulmonary/chest: Effort normal Neurologic: Patient is alert Skin: Skin is warm Psychiatric: Patient has normal mood and affect    Ortho Exam: Ortho exam demonstrates good cervical spine range of motion with 5 out of 5 grip EPL FPL interosseous are flexion extension bicep triceps and deltoid strength.  Reflexes symmetric bilateral  biceps and triceps.  Radial pulse intact bilaterally.  On the left shoulder she has no restriction of external rotation 15 degrees of abduction full forward flexion and good rotator cuff strength infraspinatus supraspinatus and subscap muscle testing with negative O'Brien's testing and no AC joint tenderness to direct palpation.  On the right-hand side she has definite grinding and crepitus with internal X rotation of the right arm and shoulder.  Motor sensory function hand is intact she has a little bit of weakness with supraspinatus testing and some bicipital groove tenderness on the right.  No acromioclavicular joint  tenderness on the right.  No other masses lymphadenopathy or skin changes noted in that shoulder girdle region.  Negative Hoffmann's bilateral hands.  She has bilateral pedal pulses intact.  No diabetes.  Specialty Comments:  No specialty comments available.  Imaging: No results found.   PMFS History: Patient Active Problem List   Diagnosis Date Noted  . Abrasion of right ankle 02/13/2017  . Rheumatoid factor positive 09/01/2016  . Leucopenia 09/01/2016  . Autoimmune disease ([place]) 02/14/2016  . Fatigue 02/14/2016  . DDD (degenerative disc disease), lumbar 02/14/2016  . Osteoarthritis of both knees 02/14/2016  . Osteoarthritis of both hands 02/14/2016  . Asthma 02/14/2016  . Migraines 02/14/2016  . Gastritis 02/14/2016  . Vitamin D deficiency 02/14/2016  . 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[place]) 02/14/2016  . High risk medication use 02/14/2016   Past Medical History:  Diagnosis Date  . Arthralgia 02/14/2016  . Asthma   . Autoimmune disease ([place]) 02/14/2016   Positive RF 139 CCP Negative  Positive ANA 1:640 Positive Ro Positive La Elevated ESR 105 - Sjorgen's Disease  . DDD (degenerative disc disease), lumbar 02/14/2016  . Fatigue 02/14/2016  . Gastritis 02/14/2016  . Hot flashes   . Migraines 02/14/2016  . Osteoarthritis of both hands 02/14/2016  . Osteoarthritis of both knees 02/14/2016  . Vitamin D deficiency 02/14/2016    Family History  Problem Relation Age of Onset  . Kidney failure Mother   . Heart failure Mother   . Lupus Mother   . COPD Father   . Hypertension Sister   . Acromegaly Sister   . Breast cancer Paternal Aunt 8    Past Surgical History:  Procedure Laterality Date  . BREAST BIOPSY Left   . TUBAL LIGATION     Social History   Occupational History  . Occupation: Pharmacist, hospital  Tobacco Use  . Smoking status: Former Smoker    Packs/day: 0.50    Years: 2.00    Pack years: 1.00    Types: Cigarettes    Quit date: 05/16/1986     Years since quitting: 32.9  . Smokeless tobacco: Never Used  . Tobacco comment: TEEN AGER  Substance and Sexual Activity  . Alcohol use: No  . Drug use: No  . Sexual activity: Not on file

## 2019-05-01 ENCOUNTER — Other Ambulatory Visit: Payer: Self-pay | Admitting: Podiatry

## 2019-05-01 ENCOUNTER — Ambulatory Visit (INDEPENDENT_AMBULATORY_CARE_PROVIDER_SITE_OTHER): Payer: BC Managed Care – PPO

## 2019-05-01 ENCOUNTER — Ambulatory Visit: Payer: BC Managed Care – PPO | Admitting: Podiatry

## 2019-05-01 ENCOUNTER — Encounter: Payer: Self-pay | Admitting: Podiatry

## 2019-05-01 ENCOUNTER — Other Ambulatory Visit: Payer: Self-pay

## 2019-05-01 DIAGNOSIS — M2011 Hallux valgus (acquired), right foot: Secondary | ICD-10-CM

## 2019-05-01 DIAGNOSIS — M349 Systemic sclerosis, unspecified: Secondary | ICD-10-CM

## 2019-05-01 DIAGNOSIS — M778 Other enthesopathies, not elsewhere classified: Secondary | ICD-10-CM

## 2019-05-02 ENCOUNTER — Telehealth: Payer: Self-pay | Admitting: *Deleted

## 2019-05-02 DIAGNOSIS — M2011 Hallux valgus (acquired), right foot: Secondary | ICD-10-CM

## 2019-05-02 DIAGNOSIS — M349 Systemic sclerosis, unspecified: Secondary | ICD-10-CM

## 2019-05-02 NOTE — Telephone Encounter (Signed)
-----   Message from Kristian Covey, [facility] sent at 05/01/2019  4:44 PM EST ----- Regarding: Referral Referral to Dr. Lendon Colonel - evaluate scleroderma

## 2019-05-02 NOTE — Telephone Encounter (Signed)
Prepared required form, 01/03/2018 clinicals and demographics, to fax with 05/01/2019 clinicals when available, to [facility].

## 2019-05-04 NOTE — Progress Notes (Signed)
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 States that he has been hurting so bad and I need to see about getting this fixed.  Review of systems: Denies fever chills nausea vomiting muscle aches pains calf pain back pain chest pain shortness of breath.  Past medical history is remarkable for positive ANA.  Objective: Vital signs are stable she is alert and oriented x3. Pulses are palpable bilateral. Neurologic sensorium is intact. Deep tendon reflexes are intact. Muscle strength is normal and symmetrical. At this point on physical exam is obvious that her skin over her left foot is thickened hyperpigmented dry and tight. She has a history of Sjogren's but this looks very much like scleroderma as well. I would be concerned about doing any type of work on this foot.  Radiographs taken today demonstrate dislocation of the first metatarsophalangeal joint of the left foot no other significant osteoarthritic changes rheumatological arthritic changes are acute findings are noted.  Assessment: Severe hallux abductovalgus deformity with obvious pain on palpation and range of motion of the joint. 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  Clinically concerned for possible scleroderma as part of her ANA syndrome.  Plan: At this point I am going to send her to Dr. Lendon Colonel for an evaluation of anything other than just Sjogren's. I would be concerned about surgical intervention on this foot though it seems to be very necessary. We would have to consider some type of minimal incision surgery for this. I would like Dr. Lendon Colonel to provide professional opinion as to whether or not to provide any type of surgical correction to the skin.

## 2019-05-04 NOTE — Telephone Encounter (Signed)
done

## 2019-05-05 ENCOUNTER — Encounter: Payer: Self-pay | Admitting: Podiatry

## 2019-05-06 ENCOUNTER — Encounter: Payer: Self-pay | Admitting: Podiatry

## 2019-05-06 ENCOUNTER — Other Ambulatory Visit: Payer: Self-pay | Admitting: Oncology

## 2019-05-06 NOTE — Telephone Encounter (Signed)
Faxed udated clinicals, prepared form and demographics to [facility].

## 2019-05-06 NOTE — Telephone Encounter (Signed)
[facility] - Victorino Dike states pt's clinicals state she had a +ANA. I reviewed and did not find a current rheumatoid/arthritic panel, so I copied and faxed the 2018. Faxed available labs to [facility].

## 2019-05-06 NOTE — Progress Notes (Signed)
There is no contraindication from a hematology standpoint to undergo foot surgery requires anesthesia.

## 2019-05-08 ENCOUNTER — Telehealth: Payer: Self-pay | Admitting: Podiatry

## 2019-05-09 NOTE — Telephone Encounter (Addendum)
This isn't the nurse that left the original message from Dr. Alver Fisher office about the clearance form. However, our fax number is [phone], if you would like to fax that form over to [place]. Thank you.

## 2019-05-09 NOTE — Telephone Encounter (Signed)
Spoke to [place] with Dr. Alver Fisher office and explained the request for the form was where Dr. Al Corpus was wanting to get medical clearance for a possible bunion surgery. I told her that Dr. Clelia Croft had responded to my letter via Epic in-basket and that if he wanted to complete the form he could, but that I have his response.

## 2019-05-09 NOTE — Telephone Encounter (Addendum)
This is Dr. Alver Fisher office calling in regards to [facility]. We wanted to see if you have a checklist from for patients who need surgical clearance. Please call me back at [phone]. Again, we wanted to see if you have a check list that Dr. Clelia Croft could use rather than typing out a letter. Thank you.

## 2019-05-09 NOTE — Telephone Encounter (Addendum)
Left message on Dr. Alver Fisher nurse line that if he wanted, Dr. Clelia Croft could dictated his medical clearance in his note. I stated if he did not want to do that, we could fax a form/check list but I would need a fax number. Asked to be called back directly if Dr. Clelia Croft wanted the form/check list.

## 2019-05-09 NOTE — Telephone Encounter (Signed)
Just trying to figure out what type of surgery she is having? Are you looking for clearance for her blood disorders? Please call me back. Thank you.

## 2019-05-20 ENCOUNTER — Other Ambulatory Visit: Payer: Self-pay

## 2019-05-20 ENCOUNTER — Ambulatory Visit
Admission: RE | Admit: 2019-05-20 | Discharge: 2019-05-20 | Disposition: A | Discharge status: Home / Self Care | Payer: BC Managed Care – PPO | Source: Ambulatory Visit | Attending: Orthopedic Surgery | Admitting: Orthopedic Surgery

## 2019-05-20 DIAGNOSIS — R0602 Shortness of breath: Secondary | ICD-10-CM

## 2019-05-20 DIAGNOSIS — M25511 Pain in right shoulder: Secondary | ICD-10-CM

## 2019-05-20 DIAGNOSIS — J4541 Moderate persistent asthma with (acute) exacerbation: Secondary | ICD-10-CM

## 2019-05-20 MED ORDER — IOPAMIDOL (ISOVUE-M 200) INJECTION 41%
15.0000 mL | Freq: Once | INTRAMUSCULAR | Status: AC
  Administered 2019-05-20: 15 mL via INTRA_ARTICULAR

## 2019-05-20 MED ORDER — FLUTICASONE PROPIONATE 50 MCG/ACT NA SUSP: 2.0000 | Freq: Every day | NASAL | 2 refills | Status: DC

## 2019-05-20 MED ORDER — PANTOPRAZOLE SODIUM 40 MG PO TBEC: 40.0000 mg | DELAYED_RELEASE_TABLET | Freq: Two times a day (BID) | ORAL | 2 refills | Status: DC

## 2019-05-28 ENCOUNTER — Ambulatory Visit: Payer: BC Managed Care – PPO | Admitting: Orthopedic Surgery

## 2019-05-28 ENCOUNTER — Encounter: Payer: Self-pay | Admitting: Orthopedic Surgery

## 2019-05-28 ENCOUNTER — Other Ambulatory Visit: Payer: Self-pay

## 2019-05-28 DIAGNOSIS — M75121 Complete rotator cuff tear or rupture of right shoulder, not specified as traumatic: Secondary | ICD-10-CM

## 2019-05-31 ENCOUNTER — Encounter: Payer: Self-pay | Admitting: Orthopedic Surgery

## 2019-05-31 NOTE — Progress Notes (Signed)
Office Visit Note   Patient: Megan Reid           Date of Birth: 1965/07/09           MRN: [id number] Visit Date: 05/28/2019 Requested by: No referring provider defined for this encounter. PCP: Patient, No Pcp Per  Subjective: Chief Complaint  Patient presents with  . Right Shoulder - Follow-up    HPI: Megan Reid is a patient with right arm pain.  She presents for follow-up of MRI scan.  MRI scan is reviewed and she does have a split tear of the biceps tendon as well as a rotator cuff tear retracted to the superior aspect of the humeral head.  This involves the supraspinatus and part of the infraspinatus.  She states that she wakes up with the shoulder sore and has pain which interferes with her activities of daily living.              ROS: All systems reviewed are negative as they relate to the chief complaint within the history of present illness.  Patient denies  fevers or chills.   Assessment & Plan: Visit Diagnoses:  1. Complete tear of right rotator cuff, unspecified whether traumatic     Plan: Impression is right shoulder rotator cuff tear and split biceps tendon.  Megan Reid looks to be intact.  Reviewed the MRI scan with the patient.  Plan at this time is arthroscopy with limited debridement of the superior labrum with biceps tendon release and mini open rotator cuff tear repair along with biceps tenodesis.  I think we will have to mobilize this tear anteriorly.  Risk benefits of surgery are discussed include not limited to infection nerve vessel damage incomplete restoration of function and incomplete pain relief as well as potential need for more surgery should the repair fail.  Patient understands the risk and benefits.  All questions answered.  Follow-Up Instructions: No follow-ups on file.   Orders:  No orders of the defined types were placed in this encounter.  No orders of the defined types were placed in this encounter.     Procedures: No procedures  performed   Clinical Data: No additional findings.  Objective: Vital Signs: LMP 05/22/2012   Physical Exam:   Constitutional: Patient appears well-developed HEENT:  Head: Normocephalic Eyes:EOM are normal Neck: Normal range of motion Cardiovascular: Normal rate Pulmonary/chest: Effort normal Neurologic: Patient is alert Skin: Skin is warm Psychiatric: Patient has normal mood and affect    Ortho Exam: Ortho exam demonstrates full active and passive range of motion of the left shoulder.  On the right-hand side she does have some grinding which was felt at the last clinic visit.  This is most pronounced with internal and external rotation at 90 degrees of abduction.  Slight supraspinatus weakness to resistance testing.  Subscap strength intact bilaterally.  Negative apprehension relocation testing on the right-hand side.  Specialty Comments:  No specialty comments available.  Imaging: No results found.   PMFS History: Patient Active Problem List   Diagnosis Date Noted  . Abrasion of right ankle 02/13/2017  . Rheumatoid factor positive 09/01/2016  . Leucopenia 09/01/2016  . Autoimmune disease ([place]) 02/14/2016  . Fatigue 02/14/2016  . DDD (degenerative disc disease), lumbar 02/14/2016  . Osteoarthritis of both knees 02/14/2016  . Osteoarthritis of both hands 02/14/2016  . Asthma 02/14/2016  . Migraines 02/14/2016  . Gastritis 02/14/2016  . Vitamin D deficiency 02/14/2016  . 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[place]) 02/14/2016  .  High risk medication use 02/14/2016   Past Medical History:  Diagnosis Date  . Arthralgia 02/14/2016  . Asthma   . Autoimmune disease ([place]) 02/14/2016   Positive RF 139 CCP Negative  Positive ANA 1:640 Positive Ro Positive La Elevated ESR 105 - Sjorgen's Disease  . DDD (degenerative disc disease), lumbar 02/14/2016  . Fatigue 02/14/2016  . Gastritis 02/14/2016  . Hot flashes   . Migraines 02/14/2016  . Osteoarthritis of  both hands 02/14/2016  . Osteoarthritis of both knees 02/14/2016  . Vitamin D deficiency 02/14/2016    Family History  Problem Relation Age of Onset  . Kidney failure Mother   . Heart failure Mother   . Lupus Mother   . COPD Father   . Hypertension Sister   . Acromegaly Sister   . Breast cancer Paternal Aunt 36    Past Surgical History:  Procedure Laterality Date  . BREAST BIOPSY Left   . TUBAL LIGATION     Social History   Occupational History  . Occupation: Pharmacist, hospital  Tobacco Use  . Smoking status: Former Smoker    Packs/day: 0.50    Years: 2.00    Pack years: 1.00    Types: Cigarettes    Quit date: 05/16/1986    Years since quitting: 33.0  . Smokeless tobacco: Never Used  . Tobacco comment: TEEN AGER  Substance and Sexual Activity  . Alcohol use: No  . Drug use: No  . Sexual activity: Not on file

## 2019-06-11 ENCOUNTER — Other Ambulatory Visit: Payer: Self-pay | Admitting: Internal Medicine

## 2019-06-11 DIAGNOSIS — J4520 Mild intermittent asthma, uncomplicated: Secondary | ICD-10-CM

## 2019-06-16 ENCOUNTER — Encounter: Payer: Self-pay | Admitting: Orthopedic Surgery

## 2019-06-16 ENCOUNTER — Other Ambulatory Visit: Payer: Self-pay | Admitting: Surgical

## 2019-06-16 DIAGNOSIS — M7521 Bicipital tendinitis, right shoulder: Secondary | ICD-10-CM

## 2019-06-16 DIAGNOSIS — M75121 Complete rotator cuff tear or rupture of right shoulder, not specified as traumatic: Secondary | ICD-10-CM

## 2019-06-16 HISTORY — PX: ROTATOR CUFF REPAIR: SHX139

## 2019-06-16 MED ORDER — METHOCARBAMOL 500 MG PO TABS: 500.0000 mg | ORAL_TABLET | Freq: Three times a day (TID) | ORAL | 0 refills | Status: DC | PRN

## 2019-06-16 MED ORDER — OXYCODONE HCL 5 MG PO TABS: 5.0000 mg | ORAL_TABLET | ORAL | 0 refills | Status: DC | PRN

## 2019-06-16 MED ORDER — ASPIRIN 81 MG PO CHEW: 81.0000 mg | CHEWABLE_TABLET | Freq: Every day | ORAL | 0 refills | Status: DC

## 2019-06-18 ENCOUNTER — Telehealth: Payer: Self-pay

## 2019-06-18 ENCOUNTER — Other Ambulatory Visit: Payer: Self-pay | Admitting: Surgical

## 2019-06-18 MED ORDER — ONDANSETRON HCL 4 MG PO TABS: 4.0000 mg | ORAL_TABLET | Freq: Three times a day (TID) | ORAL | 0 refills | Status: DC | PRN

## 2019-06-18 NOTE — Telephone Encounter (Signed)
Ok for zofran 4 po q 8 18 thx

## 2019-06-18 NOTE — Telephone Encounter (Signed)
submitted

## 2019-06-18 NOTE — Telephone Encounter (Signed)
Patient called Triage Phone states she  had SU Monday. She states she experiencing a lot of nausea. Would like something for Nausea.    Pharmacy: [facility] CB: [phone]

## 2019-06-18 NOTE — Addendum Note (Signed)
Addended byPrescott Parma on: 06/18/2019 04:24 PM   Modules accepted: Orders

## 2019-06-23 ENCOUNTER — Other Ambulatory Visit: Payer: Self-pay

## 2019-06-23 ENCOUNTER — Ambulatory Visit (INDEPENDENT_AMBULATORY_CARE_PROVIDER_SITE_OTHER): Payer: BC Managed Care – PPO | Admitting: Orthopedic Surgery

## 2019-06-23 ENCOUNTER — Encounter: Payer: Self-pay | Admitting: Orthopedic Surgery

## 2019-06-23 VITALS — Ht 69.0 in | Wt 172.0 lb

## 2019-06-23 DIAGNOSIS — M75121 Complete rotator cuff tear or rupture of right shoulder, not specified as traumatic: Secondary | ICD-10-CM

## 2019-06-23 MED ORDER — HYDROCODONE-ACETAMINOPHEN 5-325 MG PO TABS: 1.0000 | ORAL_TABLET | Freq: Four times a day (QID) | ORAL | 0 refills | Status: DC | PRN

## 2019-06-23 MED ORDER — ONDANSETRON HCL 4 MG PO TABS: 4.0000 mg | ORAL_TABLET | Freq: Three times a day (TID) | ORAL | 0 refills | Status: DC | PRN

## 2019-06-24 ENCOUNTER — Telehealth: Payer: Self-pay | Admitting: *Deleted

## 2019-06-24 NOTE — Telephone Encounter (Signed)
Pt states she got the thumbs up from Dr. Nickola Major and wanted to know what she was to do concerning her bunion and Dr. Al Corpus.

## 2019-06-25 ENCOUNTER — Encounter: Payer: Self-pay | Admitting: Orthopedic Surgery

## 2019-06-25 NOTE — Telephone Encounter (Signed)
I spoke with pt and she states she is out on FMLA and wanted to have the surgery performed this month at the hospital. I told pt I would inform Dr. Al Corpus and call again with his recommendations.

## 2019-06-25 NOTE — Telephone Encounter (Signed)
I don't go to the hospital anymore.  If she wants it there then she should see Dr Samuella Cota or Dr Logan Bores and I couldn't do it until April.

## 2019-06-25 NOTE — Telephone Encounter (Signed)
Just schedule her for a consult about a month or so before she is ready to have the surgery done.

## 2019-06-25 NOTE — Telephone Encounter (Signed)
I informed pt of Dr. Geryl Rankins 06/25/2019 4:41pm recommendation.

## 2019-06-25 NOTE — Progress Notes (Signed)
Post-Op Visit Note   Patient: Megan Reid           Date of Birth: [birth year]           MRN: [id number] Visit Date: 06/23/2019 PCP: Patient, No Pcp Per   Assessment & Plan:  Chief Complaint:  Chief Complaint  Patient presents with  . Right Shoulder - Routine Post Op   Visit Diagnoses:  1. Complete tear of right rotator cuff, unspecified whether traumatic     Plan: Megan Reid is a patient with right shoulder rotator cuff tear now a week out.  She is 78 on the CPM machine.  On exam she has some restricted external rotation but passively the other motions are improving.  Deltoid is functional.  Incisions intact.  I want her to discontinue the sling in 10 days.  Sutures DC'd Steri-Strips applied.  Change from oxycodone to Norco and add Zofran for nausea.  Come back in 2 weeks for clinical recheck.  Described her absolutely no lifting with that left arm after she comes out of the sling.  I do want her to get a little bit more external rotation.  We will start physical therapy at that time.  Follow-Up Instructions: Return in about 2 weeks (around 07/07/2019).   Orders:  No orders of the defined types were placed in this encounter.  Meds ordered this encounter  Medications  . HYDROcodone-acetaminophen (NORCO/VICODIN) 5-325 MG tablet    Sig: Take 1 tablet by mouth every 6 (six) hours as needed for moderate pain.    Dispense:  42 tablet    Refill:  0  . ondansetron (ZOFRAN) 4 MG tablet    Sig: Take 1 tablet (4 mg total) by mouth every 8 (eight) hours as needed for nausea or vomiting.    Dispense:  30 tablet    Refill:  0    Imaging: No results found.  PMFS History: Patient Active Problem List   Diagnosis Date Noted  . Abrasion of right ankle 02/13/2017  . Rheumatoid factor positive 09/01/2016  . Leucopenia 09/01/2016  . Autoimmune disease ([place]) 02/14/2016  . Fatigue 02/14/2016  . DDD (degenerative disc disease), lumbar 02/14/2016  . Osteoarthritis of both knees  02/14/2016  . Osteoarthritis of both hands 02/14/2016  . Asthma 02/14/2016  . Migraines 02/14/2016  . Gastritis 02/14/2016  . Vitamin D deficiency 02/14/2016  . 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[place]) 02/14/2016  . High risk medication use 02/14/2016   Past Medical History:  Diagnosis Date  . Arthralgia 02/14/2016  . Asthma   . Autoimmune disease ([place]) 02/14/2016   Positive RF 139 CCP Negative  Positive ANA 1:640 Positive Ro Positive La Elevated ESR 105 - Sjorgen's Disease  . DDD (degenerative disc disease), lumbar 02/14/2016  . Fatigue 02/14/2016  . Gastritis 02/14/2016  . Hot flashes   . Migraines 02/14/2016  . Osteoarthritis of both hands 02/14/2016  . Osteoarthritis of both knees 02/14/2016  . Vitamin D deficiency 02/14/2016    Family History  Problem Relation Age of Onset  . Kidney failure Mother   . Heart failure Mother   . Lupus Mother   . COPD Father   . Hypertension Sister   . Acromegaly Sister   . Breast cancer Paternal Aunt 25    Past Surgical History:  Procedure Laterality Date  . BREAST BIOPSY Left   . TUBAL LIGATION     Social History   Occupational History  . Occupation: Pharmacist, hospital  Tobacco Use  . Smoking  status: Former Smoker    Packs/day: 0.50    Years: 2.00    Pack years: 1.00    Types: Cigarettes    Quit date: 05/16/1986    Years since quitting: 33.1  . Smokeless tobacco: Never Used  . Tobacco comment: TEEN AGER  Substance and Sexual Activity  . Alcohol use: No  . Drug use: No  . Sexual activity: Not on file

## 2019-06-25 NOTE — Telephone Encounter (Signed)
Val see if you can get your hands on that report from [place].  I think I saw it yesterday and signed it. May have gone to batch scan.  Then we can have her in for consult.

## 2019-06-25 NOTE — Telephone Encounter (Signed)
I spoke with [facility] - Morrie Sheldon and asked that the last office note be faxed to [phone].

## 2019-06-25 NOTE — Telephone Encounter (Signed)
Faxed Dr. Nickola Major LOV to Dr. Al Corpus.

## 2019-07-07 ENCOUNTER — Other Ambulatory Visit: Payer: Self-pay

## 2019-07-07 ENCOUNTER — Ambulatory Visit (INDEPENDENT_AMBULATORY_CARE_PROVIDER_SITE_OTHER): Payer: BC Managed Care – PPO | Admitting: Orthopedic Surgery

## 2019-07-07 ENCOUNTER — Encounter: Payer: Self-pay | Admitting: Orthopedic Surgery

## 2019-07-07 DIAGNOSIS — M75121 Complete rotator cuff tear or rupture of right shoulder, not specified as traumatic: Secondary | ICD-10-CM

## 2019-07-07 MED ORDER — IBUPROFEN 800 MG PO TABS: 800.0000 mg | ORAL_TABLET | Freq: Three times a day (TID) | ORAL | 0 refills | Status: DC | PRN

## 2019-07-11 ENCOUNTER — Encounter: Payer: Self-pay | Admitting: Orthopedic Surgery

## 2019-07-11 ENCOUNTER — Other Ambulatory Visit: Payer: Self-pay | Admitting: Obstetrics and Gynecology

## 2019-07-11 DIAGNOSIS — Z1231 Encounter for screening mammogram for malignant neoplasm of breast: Secondary | ICD-10-CM

## 2019-07-11 NOTE — Progress Notes (Signed)
Post-Op Visit Note   Patient: Megan Reid           Date of Birth: 10/14/1965           MRN: [id number] Visit Date: 07/07/2019 PCP: Patient, No Pcp Per   Assessment & Plan:  Chief Complaint:  Chief Complaint  Patient presents with  . Right Shoulder - Follow-up   Visit Diagnoses:  1. Complete tear of right rotator cuff, unspecified whether traumatic     Plan: Patient is a 54 year old female who presents s/p right shoulder rotator cuff tear repair.  She is about 3 weeks out from surgery.  She has discontinued her sling.  Incision is healing well.  Pain continues to improve.  She does have some stiffness with external rotation but abduction and forward flexion continues to improve.  Cautioned against lifting with operative arm.  Plan to start physical therapy.  Also prescribed ibuprofen 800 mg for pain control.  Patient will try this rather than the hydrocodone.  Return to the office in 3 weeks for clinical recheck and we will likely begin strengthening in physical therapy at that time.  Follow-Up Instructions: No follow-ups on file.   Orders:  Orders Placed This Encounter  Procedures  . Ambulatory referral to Physical Therapy   Meds ordered this encounter  Medications  . ibuprofen (ADVIL) 800 MG tablet    Sig: Take 1 tablet (800 mg total) by mouth every 8 (eight) hours as needed.    Dispense:  30 tablet    Refill:  0    Imaging: No results found.  PMFS History: Patient Active Problem List   Diagnosis Date Noted  . Abrasion of right ankle 02/13/2017  . Rheumatoid factor positive 09/01/2016  . Leucopenia 09/01/2016  . Autoimmune disease ([address]) 02/14/2016  . Fatigue 02/14/2016  . DDD (degenerative disc disease), lumbar 02/14/2016  . Osteoarthritis of both knees 02/14/2016  . Osteoarthritis of both hands 02/14/2016  . Asthma 02/14/2016  . Migraines 02/14/2016  . Gastritis 02/14/2016  . Vitamin D deficiency 02/14/2016  . Sjogren's syndrome with  keratoconjunctivitis sicca ([place]) 02/14/2016  . High risk medication use 02/14/2016   Past Medical History:  Diagnosis Date  . Arthralgia 02/14/2016  . Asthma   . Autoimmune disease ([place]) 02/14/2016   Positive RF 139 CCP Negative  Positive ANA 1:640 Positive Ro Positive La Elevated ESR 105 - Sjorgen's Disease  . DDD (degenerative disc disease), lumbar 02/14/2016  . Fatigue 02/14/2016  . Gastritis 02/14/2016  . Hot flashes   . Migraines 02/14/2016  . Osteoarthritis of both hands 02/14/2016  . Osteoarthritis of both knees 02/14/2016  . Vitamin D deficiency 02/14/2016    Family History  Problem Relation Age of Onset  . Kidney failure Mother   . Heart failure Mother   . Lupus Mother   . COPD Father   . Hypertension Sister   . Acromegaly Sister   . Breast cancer Paternal Aunt 24    Past Surgical History:  Procedure Laterality Date  . BREAST BIOPSY Left   . TUBAL LIGATION     Social History   Occupational History  . Occupation: Pharmacist, hospital  Tobacco Use  . Smoking status: Former Smoker    Packs/day: 0.50    Years: 2.00    Pack years: 1.00    Types: Cigarettes    Quit date: 05/16/1986    Years since quitting: 33.1  . Smokeless tobacco: Never Used  . Tobacco comment: TEEN AGER  Substance and Sexual Activity  .  Alcohol use: No  . Drug use: No  . Sexual activity: Not on file

## 2019-07-14 ENCOUNTER — Other Ambulatory Visit: Payer: Self-pay | Admitting: Surgical

## 2019-07-14 NOTE — Telephone Encounter (Signed)
Dean patient 

## 2019-07-14 NOTE — Telephone Encounter (Signed)
Ok to rf? 

## 2019-07-21 ENCOUNTER — Ambulatory Visit: Payer: BC Managed Care – PPO | Admitting: Physical Therapy

## 2019-07-21 ENCOUNTER — Encounter: Payer: Self-pay | Admitting: Physical Therapy

## 2019-07-21 ENCOUNTER — Other Ambulatory Visit: Payer: Self-pay

## 2019-07-21 DIAGNOSIS — M25611 Stiffness of right shoulder, not elsewhere classified: Secondary | ICD-10-CM | POA: Diagnosis not present

## 2019-07-21 DIAGNOSIS — R600 Localized edema: Secondary | ICD-10-CM

## 2019-07-21 DIAGNOSIS — M25511 Pain in right shoulder: Secondary | ICD-10-CM

## 2019-07-21 DIAGNOSIS — M6281 Muscle weakness (generalized): Secondary | ICD-10-CM

## 2019-07-21 NOTE — Patient Instructions (Addendum)
Access Code: [email or web address]: [email or web address]: 04/05/2021Prepared by: Arlys John NelsonExercises  Seated Shoulder Flexion Slide at Table Top with Forearm in Neutral - 2 x daily - 6 x weekly - 10 reps - 1-2 sets - 5 sec hold  Seated Shoulder Scaption Slide at Table Top with Forearm in Neutral - 2 x daily - 6 x weekly - 1- sets - 10 reps - 5 sec hold  Seated Shoulder External Rotation PROM on Table - 2-3 x daily - 6 x weekly - 10 reps - 1-2 sets - 5 hold  Seated Scapular Retraction - 2 x daily - 6 x weekly - 10 reps - 3 sets  Circular Shoulder Pendulum with Table Support - 2 x daily - 6 x weekly - 10 reps - 3 sets

## 2019-07-21 NOTE — Therapy (Signed)
[facility] [address] [place], [place], [address] Phone: [phone]   Fax:  [phone]  Physical Therapy Evaluation  Patient Details  Name: Megan Reid MRN: [id number] Date of Birth: 09/30/1965 Referring Provider (PT): Marlou Sa, MD   Encounter Date: 07/21/2019  PT End of Session - 07/21/19 1425    Visit Number  1    Number of Visits  24    Date for PT Re-Evaluation  09/15/19    Authorization Type  BCBS    PT Start Time  1322    PT Stop Time  1405    PT Time Calculation (min)  43 min    Activity Tolerance  Patient tolerated treatment well    Behavior During Therapy  [facility] for tasks assessed/performed       Past Medical History:  Diagnosis Date  . Arthralgia 02/14/2016  . Asthma   . Autoimmune disease ([place]) 02/14/2016   Positive RF 139 CCP Negative  Positive ANA 1:640 Positive Ro Positive La Elevated ESR 105 - Sjorgen's Disease  . DDD (degenerative disc disease), lumbar 02/14/2016  . Fatigue 02/14/2016  . Gastritis 02/14/2016  . Hot flashes   . Migraines 02/14/2016  . Osteoarthritis of both hands 02/14/2016  . Osteoarthritis of both knees 02/14/2016  . Vitamin D deficiency 02/14/2016    Past Surgical History:  Procedure Laterality Date  . BREAST BIOPSY Left   . TUBAL LIGATION      There were no vitals filed for this visit.   Subjective Assessment - 07/21/19 1329    Subjective  she is 5 weeks post op from Rt shoulder RTC repair on 06/16/19. She has already DC from sling but says she is not using her arm. PT cautioned against AROM for one more week. 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  Yes    Pain Score  7     Pain Location  Shoulder    Pain Orientation  Right    Pain Descriptors / Indicators  Sharp    Pain Type   Surgical pain    Pain Radiating Towards  denies, some tingling in her Rt shoulder at night    Pain Onset  More than a month ago    Pain Frequency  Intermittent    Aggravating Factors   sleeping    Pain Relieving Factors  meds, ice         [facility] PT Assessment - 07/21/19 0001      Assessment   Medical Diagnosis   s/p right shoulder rotator cuff tear repair 06/16/19     Referring Provider (PT)  Marlou Sa, MD    Onset Date/Surgical Date  06/16/19    Hand Dominance  Right    Next MD Visit  07/28/19    Prior Therapy  none      Precautions   Precaution Comments  no AROM for Rt shoulder for one more week      Balance Screen   Has the patient fallen in the past 6 months  No      [place] residence      Prior Function   Level of Independence  Independent    Vocation  Full time employment  Vocation Requirements  educator, has to be able to write on the board    Leisure  playing with grandkids      Cognition   Overall Cognitive Status  Within Functional Limits for tasks assessed      Observation/Other Assessments   Observations  incision sites well healed. mild to mod edema in Rt shoulder      Sensation   Light Touch  Appears Intact      ROM / Strength   AROM / PROM / Strength  PROM;AROM      AROM   Overall AROM Comments  did not asess due to post op status, will measure at 6 weeks post op      PROM   Overall PROM Comments  pain and guarding with PROM, empty end feel    PROM Assessment Site  Shoulder    Right/Left Shoulder  Right    Right Shoulder Flexion  60 Degrees    Right Shoulder ABduction  30 Degrees    Right Shoulder Internal Rotation  30 Degrees    Right Shoulder External Rotation  20 Degrees                Objective measurements completed on examination: See above findings.      [place] Adult PT Treatment/Exercise - 07/21/19 0001      Exercises   Exercises  Shoulder      Shoulder Exercises: Supine   External  Rotation  AAROM;10 reps    External Rotation Limitations  limited to small ROM due to pain      Shoulder Exercises: Seated   Other Seated Exercises  tableslides AAROM/PROM for flexion, scaption, ER X 10 reps ea      Shoulder Exercises: Standing   Other Standing Exercises  pendulums X 10 CW, CCW      Modalities   Modalities  Vasopneumatic      Vasopneumatic   Number Minutes Vasopneumatic   10 minutes    Vasopnuematic Location   Shoulder    Vasopneumatic Pressure  Low    Vasopneumatic Temperature   34      Manual Therapy   Manual therapy comments  Rt shoulder PROM all planes gentle as tolerated             PT Education - 07/21/19 1425    Education Details  HEP, POC    Person(s) Educated  Patient    Methods  Explanation;Demonstration;Verbal cues;Handout    Comprehension  Verbalized understanding;Returned demonstration;Need further instruction       PT Short Term Goals - 07/21/19 1427      PT SHORT TERM GOAL #1   Title  Pt will be I and compliant with initial HEP.    Time  4    Period  Weeks    Status  New    Target Date  08/18/19      PT SHORT TERM GOAL #2   Title  Pt will improve shoulder PROM to [facility].    Status  New        PT Long Term Goals - 07/21/19 1428      PT LONG TERM GOAL #1   Title  Pt will be I and compliant with advanced HEP. (target for all LTG 8 weeks 09/15/19    Time  8    Period  Weeks    Status  New    Target Date  09/15/19      PT LONG TERM GOAL #2   Title  Pt will improve Rt shoulder AROM to [facility].    Status  New      PT LONG TERM GOAL #3   Title  Pt will improve Rt shoulder strength to 4+/5 to improve function.      PT LONG TERM GOAL #4   Title  Pt will reduce overall pain with usual activity and with reaching/writting to no more than 3/10 on avg    Status  New             Plan - 07/21/19 1412    Clinical Impression Statement  Pt presents with Rt shoulder pain, stiffness, and weakness S/P RTC repair on 06/16/19. She is 5  weeks post op. She will benefit from skiled PT to address her functional deficits and regain funcitonal use of her Rt arm.    Personal Factors and Comorbidities  Comorbidity 2    Comorbidities  PMH: S/P RTC repair 06/16/19, autoimmune disease, lumbar DDD,    Examination-Activity Limitations  Carry;Sleep;Lift;Reach Overhead    Examination-Participation Restrictions  Cleaning;Driving;Meal Prep;Laundry    Stability/Clinical Decision Making  Evolving/Moderate complexity    Clinical Decision Making  Moderate    Rehab Potential  Good    PT Frequency  2x / week   2-3   PT Duration  8 weeks    PT Treatment/Interventions  Electrical Stimulation;ADLs/Self Care Home Management;Iontophoresis 59m/ml Dexamethasone;Moist Heat;Ultrasound;Therapeutic activities;Therapeutic exercise;Neuromuscular re-education;Patient/family education;Manual techniques;Passive range of motion;Dry needling;Joint Manipulations;Taping    PT Next Visit Plan  review and update HEP PRN, will be 6 weeks post op next week.    PT Home Exercise Plan  Access Code: [email or web address]   Consulted and Agree with Plan of Care  Patient       Patient will benefit from skilled therapeutic intervention in order to improve the following deficits and impairments:  Decreased activity tolerance, Decreased endurance, Decreased range of motion, Decreased strength, Increased fascial restricitons, Increased muscle spasms, Impaired flexibility, Pain  Visit Diagnosis: Acute pain of right shoulder  Stiffness of right shoulder, not elsewhere classified  Muscle weakness (generalized)  Localized edema     Problem List Patient Active Problem List   Diagnosis Date Noted  . Abrasion of right ankle 02/13/2017  . Rheumatoid factor positive 09/01/2016  . Leucopenia 09/01/2016  . [place]immune disease ([place] 02/14/2016  . Fatigue 02/14/2016  . DDD (degenerative disc disease), lumbar 02/14/2016  . Osteoarthritis of both knees 02/14/2016  . Osteoarthritis of both  hands 02/14/2016  . Asthma 02/14/2016  . Migraines 02/14/2016  . Gastritis 02/14/2016  . Vitamin D deficiency 02/14/2016  . 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[place] 02/14/2016  . High risk medication use 02/14/2016    BDebbe Odea PT,DPT 07/21/2019, 2:31 PM  [facility] [address]GC[address] [place] [address]Phone: [phone]  Fax:  [phone] Name: Megan CullopMRN: [id number] of Birth: [birth year]

## 2019-07-21 NOTE — Addendum Note (Signed)
Addended by: April Manson on: 07/21/2019 02:32 PM   Modules accepted: Orders

## 2019-07-28 ENCOUNTER — Other Ambulatory Visit: Payer: Self-pay

## 2019-07-28 ENCOUNTER — Ambulatory Visit: Payer: BC Managed Care – PPO | Admitting: Physical Therapy

## 2019-07-28 ENCOUNTER — Encounter: Payer: Self-pay | Admitting: Orthopedic Surgery

## 2019-07-28 ENCOUNTER — Ambulatory Visit (INDEPENDENT_AMBULATORY_CARE_PROVIDER_SITE_OTHER): Payer: BC Managed Care – PPO | Admitting: Orthopedic Surgery

## 2019-07-28 DIAGNOSIS — M75121 Complete rotator cuff tear or rupture of right shoulder, not specified as traumatic: Secondary | ICD-10-CM

## 2019-07-28 MED ORDER — METHOCARBAMOL 500 MG PO TABS: 500.0000 mg | ORAL_TABLET | Freq: Three times a day (TID) | ORAL | 0 refills | Status: DC | PRN

## 2019-07-29 ENCOUNTER — Ambulatory Visit
Admission: RE | Admit: 2019-07-29 | Discharge: 2019-07-29 | Disposition: A | Discharge status: Home / Self Care | Payer: BC Managed Care – PPO | Source: Ambulatory Visit | Attending: Obstetrics and Gynecology | Admitting: Obstetrics and Gynecology

## 2019-07-29 ENCOUNTER — Encounter: Payer: Self-pay | Admitting: Rehabilitative and Restorative Service Providers"

## 2019-07-29 ENCOUNTER — Ambulatory Visit: Payer: BC Managed Care – PPO | Admitting: Rehabilitative and Restorative Service Providers"

## 2019-07-29 DIAGNOSIS — M25511 Pain in right shoulder: Secondary | ICD-10-CM | POA: Diagnosis not present

## 2019-07-29 DIAGNOSIS — R600 Localized edema: Secondary | ICD-10-CM

## 2019-07-29 DIAGNOSIS — M6281 Muscle weakness (generalized): Secondary | ICD-10-CM

## 2019-07-29 DIAGNOSIS — M25611 Stiffness of right shoulder, not elsewhere classified: Secondary | ICD-10-CM | POA: Diagnosis not present

## 2019-07-29 DIAGNOSIS — Z1231 Encounter for screening mammogram for malignant neoplasm of breast: Secondary | ICD-10-CM

## 2019-07-29 NOTE — Therapy (Addendum)
[facility] [address] [place], [place], [address] Phone: [phone]   Fax:  [phone]  Physical Therapy Treatment  Patient Details  Name: Megan Reid MRN: [id number] Date of Birth: 05-08-1965 Referring Provider (PT): Marlou Sa, MD   Encounter Date: 07/29/2019  PT End of Session - 07/29/19 1216    Visit Number  2    Number of Visits  24    Date for PT Re-Evaluation  09/15/19    Authorization Type  BCBS    PT Start Time  6579    PT Stop Time  1236    PT Time Calculation (min)  51 min    Activity Tolerance  Patient tolerated treatment well    Behavior During Therapy  [facility] for tasks assessed/performed       Past Medical History:  Diagnosis Date  . Arthralgia 02/14/2016  . Asthma   . Autoimmune disease ([place]) 02/14/2016   Positive RF 139 CCP Negative  Positive ANA 1:640 Positive Ro Positive La Elevated ESR 105 - Sjorgen's Disease  . DDD (degenerative disc disease), lumbar 02/14/2016  . Fatigue 02/14/2016  . Gastritis 02/14/2016  . Hot flashes   . Migraines 02/14/2016  . Osteoarthritis of both hands 02/14/2016  . Osteoarthritis of both knees 02/14/2016  . Vitamin D deficiency 02/14/2016    Past Surgical History:  Procedure Laterality Date  . BREAST BIOPSY Left   . TUBAL LIGATION      There were no vitals filed for this visit.  Subjective Assessment - 07/29/19 1148    Subjective  Pt. indicated feeling pain c some intervention.  Pt. stated she isn't confident in using Rt arm at this time.  Pt. produced referral from MD office saying strengthening was ok.    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  Yes    Pain Score  5     Pain Location  Shoulder    Pain Orientation  Right    Pain Onset  More than a month ago    Pain Frequency  Intermittent    Aggravating Factors   exercises, activity     Pain Relieving Factors  rest, ice                           07/29/19 0001  Shoulder Exercises: Supine  Other Supine Exercises Rt flexion supine AAROM c Lt UE assist 2 x 10  Shoulder Exercises: Seated  Other Seated Exercises tableslides AAROM/PROM for flexion, scaption, ER X 10 reps ea  Shoulder Exercises: Isometric Strengthening  Flexion  (5 seconds x 10)  External Rotation  (5 seconds x 10)  Internal Rotation  (5 seconds x 10)  ABduction  (5 seconds x 10)  Vasopneumatic  Number Minutes Vasopneumatic  10 minutes  Vasopnuematic Location  Shoulder  Vasopneumatic Pressure Medium  Vasopneumatic Temperature  34  Manual Therapy  Manual therapy comments g3-g4 inferior Rt GH jt mobs, ap jt mobs, prom c overpressure          PT Education - 07/29/19 1209    Education Details  Intervention cues, symptom response education for end range improvements.    Person(s) Educated  Patient    Methods  Explanation;Demonstration;Verbal cues    Comprehension  Verbalized understanding;Returned demonstration       PT  Short Term Goals - 07/21/19 1427      PT SHORT TERM GOAL #1   Title  Pt will be I and compliant with initial HEP.    Time  4    Period  Weeks    Status  New    Target Date  08/18/19      PT SHORT TERM GOAL #2   Title  Pt will improve shoulder PROM to [facility].    Status  New        PT Long Term Goals - 07/21/19 1428      PT LONG TERM GOAL #1   Title  Pt will be I and compliant with advanced HEP. (target for all LTG 8 weeks 09/15/19    Time  8    Period  Weeks    Status  New    Target Date  09/15/19      PT LONG TERM GOAL #2   Title  Pt will improve Rt shoulder AROM to [facility].    Status  New      PT LONG TERM GOAL #3   Title  Pt will improve Rt shoulder strength to 4+/5 to improve function.      PT LONG TERM GOAL #4   Title  Pt will reduce overall pain with usual activity and with reaching/writting to no more than 3/10 on avg    Status  New             Plan - 07/29/19 1209    Clinical Impression Statement  Pt. demonstrated marked restriction and pain noted c elevation attempts in AAROM, PROM activity today.  Demonstration of mild joint restriction noted c myofascial guarding and pain more prominent as limiting factor at this time.  Pt. is allowed to perform strengthening at this time but mobility limitations/pain current limit progression.    Personal Factors and Comorbidities  Comorbidity 2    Comorbidities  PMH: S/P RTC repair 06/16/19, autoimmune disease, lumbar DDD,    Examination-Activity Limitations  Carry;Sleep;Lift;Reach Overhead    Examination-Participation Restrictions  Cleaning;Driving;Meal Prep;Laundry    Stability/Clinical Decision Making  Evolving/Moderate complexity    Rehab Potential  Good    PT Frequency  2x / week   2-3   PT Duration  8 weeks    PT Treatment/Interventions  Electrical Stimulation;ADLs/Self Care Home Management;Iontophoresis 77m/ml Dexamethasone;Moist Heat;Ultrasound;Therapeutic activities;Therapeutic exercise;Neuromuscular re-education;Patient/family education;Manual techniques;Passive range of motion;Dry needling;Joint Manipulations;Taping    PT Next Visit Plan  review and update HEP PRN, will be 6 weeks post op next week.    PT Home Exercise Plan  Access Code: [email or web address]   Consulted and Agree with Plan of Care  Patient       Patient will benefit from skilled therapeutic intervention in order to improve the following deficits and impairments:  Decreased activity tolerance, Decreased endurance, Decreased range of motion, Decreased strength, Increased fascial restricitons, Increased muscle spasms, Impaired flexibility, Pain  Visit Diagnosis: Acute pain of right shoulder  Stiffness of right shoulder, not elsewhere classified  Muscle weakness (generalized)  Localized edema     Problem List Patient Active Problem List   Diagnosis Date Noted  . Abrasion of right ankle 02/13/2017  .  Rheumatoid factor positive 09/01/2016  . Leucopenia 09/01/2016  . Autoimmune disease ([place] 02/14/2016  . Fatigue 02/14/2016  . DDD (degenerative disc disease), lumbar 02/14/2016  . Osteoarthritis of both knees 02/14/2016  . Osteoarthritis of both hands 02/14/2016  . Asthma 02/14/2016  . Migraines 02/14/2016  . Gastritis  02/14/2016  . Vitamin D deficiency 02/14/2016  . 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[place]) 02/14/2016  . High risk medication use 02/14/2016   Scot Jun, PT, DPT, OCS, ATC 07/31/19  12:23 PM      [facility] [address] [place], [place], [address] Phone: [phone]   Fax:  [phone]  Name: Kazaria Gaertner MRN: [id number] Date of Birth: May 03, 1965

## 2019-07-31 ENCOUNTER — Encounter: Payer: Self-pay | Admitting: Rehabilitative and Restorative Service Providers"

## 2019-07-31 ENCOUNTER — Telehealth: Payer: Self-pay | Admitting: Orthopedic Surgery

## 2019-07-31 ENCOUNTER — Other Ambulatory Visit: Payer: Self-pay

## 2019-07-31 ENCOUNTER — Ambulatory Visit: Payer: BC Managed Care – PPO | Admitting: Rehabilitative and Restorative Service Providers"

## 2019-07-31 ENCOUNTER — Ambulatory Visit: Payer: BC Managed Care – PPO | Attending: Family

## 2019-07-31 DIAGNOSIS — R600 Localized edema: Secondary | ICD-10-CM | POA: Diagnosis not present

## 2019-07-31 DIAGNOSIS — M25611 Stiffness of right shoulder, not elsewhere classified: Secondary | ICD-10-CM

## 2019-07-31 DIAGNOSIS — M25511 Pain in right shoulder: Secondary | ICD-10-CM

## 2019-07-31 DIAGNOSIS — M6281 Muscle weakness (generalized): Secondary | ICD-10-CM | POA: Diagnosis not present

## 2019-07-31 DIAGNOSIS — Z23 Encounter for immunization: Secondary | ICD-10-CM

## 2019-07-31 NOTE — Therapy (Signed)
[facility] [address] [place], [place], [address] Phone: [phone]   Fax:  [phone]  Physical Therapy Treatment  Patient Details  Name: Megan Reid MRN: [id number] Date of Birth: 1965-09-03 Referring Provider (PT): Marlou Sa, MD   Encounter Date: 07/31/2019  PT End of Session - 07/31/19 1235    Visit Number  3    Number of Visits  24    Date for PT Re-Evaluation  09/15/19    Authorization Type  BCBS    PT Start Time  7564    PT Stop Time  1240    PT Time Calculation (min)  55 min    Activity Tolerance  Patient tolerated treatment well    Behavior During Therapy  [facility] for tasks assessed/performed       Past Medical History:  Diagnosis Date  . Arthralgia 02/14/2016  . Asthma   . Autoimmune disease ([place]) 02/14/2016   Positive RF 139 CCP Negative  Positive ANA 1:640 Positive Ro Positive La Elevated ESR 105 - Sjorgen's Disease  . DDD (degenerative disc disease), lumbar 02/14/2016  . Fatigue 02/14/2016  . Gastritis 02/14/2016  . Hot flashes   . Migraines 02/14/2016  . Osteoarthritis of both hands 02/14/2016  . Osteoarthritis of both knees 02/14/2016  . Vitamin D deficiency 02/14/2016    Past Surgical History:  Procedure Laterality Date  . BREAST BIOPSY Left   . TUBAL LIGATION      There were no vitals filed for this visit.                    [facility] Adult PT Treatment/Exercise - 07/31/19 0001      Shoulder Exercises: Supine   Other Supine Exercises  Rt flexion supine AAROM c Lt UE assist 3 x 10    Other Supine Exercises  supine wand 1 lb c min to mod A from clinician in flexion x 15      Shoulder Exercises: Standing   Row  Strengthening;AROM   3 x 10 green band     Vasopneumatic   Number Minutes Vasopneumatic   10 minutes    Vasopnuematic Location   Shoulder    Vasopneumatic Pressure  Medium    Vasopneumatic Temperature   34      Manual Therapy   Manual therapy comments  g3-g4 inferior Rt GH jt mobs,  ap jt mobs, prom c overpressure               PT Short Term Goals - 07/21/19 1427      PT SHORT TERM GOAL #1   Title  Pt will be I and compliant with initial HEP.    Time  4    Period  Weeks    Status  New    Target Date  08/18/19      PT SHORT TERM GOAL #2   Title  Pt will improve shoulder PROM to [facility].    Status  New        PT Long Term Goals - 07/21/19 1428      PT LONG TERM GOAL #1   Title  Pt will be I and compliant with advanced HEP. (target for all LTG 8 weeks 09/15/19    Time  8    Period  Weeks    Status  New    Target Date  09/15/19      PT LONG TERM GOAL #2   Title  Pt will improve Rt shoulder AROM to [facility].  Status  New      PT LONG TERM GOAL #3   Title  Pt will improve Rt shoulder strength to 4+/5 to improve function.      PT LONG TERM GOAL #4   Title  Pt will reduce overall pain with usual activity and with reaching/writting to no more than 3/10 on avg    Status  New            Plan - 07/31/19 1234    Clinical Impression Statement  Passive elevation attempts improved 15-20 degrees today c end range pain/guarding still noted.  Resistance in connective tissue mobility noted.  Tenderness to touch at [facility] joint.    Personal Factors and Comorbidities  Comorbidity 2    Comorbidities  PMH: S/P RTC repair 06/16/19, autoimmune disease, lumbar DDD,    Examination-Activity Limitations  Carry;Sleep;Lift;Reach Overhead    Examination-Participation Restrictions  Cleaning;Driving;Meal Prep;Laundry    Stability/Clinical Decision Making  Evolving/Moderate complexity    Rehab Potential  Good    PT Frequency  2x / week   2-3   PT Duration  8 weeks    PT Treatment/Interventions  Electrical Stimulation;ADLs/Self Care Home Management;Iontophoresis 18m/ml Dexamethasone;Moist Heat;Ultrasound;Therapeutic activities;Therapeutic exercise;Neuromuscular re-education;Patient/family education;Manual techniques;Passive range of motion;Dry needling;Joint Manipulations;Taping     PT Next Visit Plan  Continue mobility gains, progress AAROM.    PT Home Exercise Plan  Access Code: [email or web address]   Consulted and Agree with Plan of Care  Patient       Patient will benefit from skilled therapeutic intervention in order to improve the following deficits and impairments:  Decreased activity tolerance, Decreased endurance, Decreased range of motion, Decreased strength, Increased fascial restricitons, Increased muscle spasms, Impaired flexibility, Pain  Visit Diagnosis: Acute pain of right shoulder  Stiffness of right shoulder, not elsewhere classified  Muscle weakness (generalized)  Localized edema     Problem List Patient Active Problem List   Diagnosis Date Noted  . Abrasion of right ankle 02/13/2017  . Rheumatoid factor positive 09/01/2016  . Leucopenia 09/01/2016  . Autoimmune disease ([place] 02/14/2016  . Fatigue 02/14/2016  . DDD (degenerative disc disease), lumbar 02/14/2016  . Osteoarthritis of both knees 02/14/2016  . Osteoarthritis of both hands 02/14/2016  . Asthma 02/14/2016  . Migraines 02/14/2016  . Gastritis 02/14/2016  . Vitamin D deficiency 02/14/2016  . 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[place] 02/14/2016  . High risk medication use 02/14/2016   MScot Jun PT, DPT, OCS, ATC 07/31/19  12:36 PM    [facility] [address] [address]Phone: [phone]  Fax:  [phone] Name: TDaesha InscoMRN: [id number] of Birth: [birth year]

## 2019-07-31 NOTE — Telephone Encounter (Signed)
Patient called advised the note returning her back to work do not have a date on it. Patient said her employer will not except note without a return to work date on it. Patient said she will pick up the amended note today. The number to contact patient is [phone]

## 2019-07-31 NOTE — Progress Notes (Signed)
   Covid-19 Vaccination Clinic  Name:  Megan Reid    MRN: [id number] DOB: 18-Sep-1965  07/31/2019  [name] was observed post Covid-19 immunization for 15 minutes without incident. She was provided with Vaccine Information Sheet and instruction to access the V-Safe system.   [name] was instructed to call 911 with any severe reactions post vaccine: Marland Kitchen Difficulty breathing  . Swelling of face and throat  . A fast heartbeat  . A bad rash all over body  . Dizziness and weakness   Immunizations Administered    Name Date Dose VIS Date Route   Moderna COVID-19 Vaccine 07/31/2019  3:42 PM 0.5 mL 03/18/2019 Intramuscular   Manufacturer: Moderna   Lot: [id number]   NDC: 76184-859-27

## 2019-07-31 NOTE — Telephone Encounter (Signed)
Addendum made to note. Called patient and advised.

## 2019-08-01 ENCOUNTER — Encounter: Payer: Self-pay | Admitting: Orthopedic Surgery

## 2019-08-01 NOTE — Progress Notes (Signed)
Post-Op Visit Note   Patient: Megan Reid           Date of Birth: [birth year]           MRN: [id number] Visit Date: 07/28/2019 PCP: Patient, No Pcp Per   Assessment & Plan:  Chief Complaint:  Chief Complaint  Patient presents with  . Right Shoulder - Pain   Visit Diagnoses: No diagnosis found.  Plan: Patient is a 54 year old female who presents s/p right shoulder rotator cuff tear repair.  She is about 6 weeks out from surgery.  She notes that she is continuing to improve.  She has started physical therapy last Wednesday.  They are working on scapular retractions, pendulums, passive range of motion.  No active range of motion yet no strengthening yet.  She denies any numbness or tingling or radicular pain.  She has finished her prescription of methocarbamol.  She has occasional clicking in her shoulder.  She also wakes up every night if she does not take a muscle relaxer.  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  On exam she has 70 degrees of abduction, 80 degrees of forward flexion.  She is still stiff in external rotation.  Incisions are healing well.  Plan for patient to start active assisted range of motion and physical therapy as well as strengthening.  Refilled Robaxin to help patient sleep.  Plan for patient to be out of work over the next 6 weeks and then return to work after that.  Follow-up in 6 weeks for clinical recheck.  Follow-Up Instructions: No follow-ups on file.   Orders:  No orders of the defined types were placed in this encounter.  Meds ordered this encounter  Medications  . methocarbamol (ROBAXIN) 500 MG tablet    Sig: Take 1 tablet (500 mg total) by mouth every 8 (eight) hours as needed.    Dispense:  30 tablet    Refill:  0    Imaging: No results found.  PMFS History: Patient Active Problem List   Diagnosis Date Noted  . Abrasion of right ankle 02/13/2017  . Rheumatoid factor positive 09/01/2016  . Leucopenia 09/01/2016    . Autoimmune disease ([place]) 02/14/2016  . Fatigue 02/14/2016  . DDD (degenerative disc disease), lumbar 02/14/2016  . Osteoarthritis of both knees 02/14/2016  . Osteoarthritis of both hands 02/14/2016  . Asthma 02/14/2016  . Migraines 02/14/2016  . Gastritis 02/14/2016  . Vitamin D deficiency 02/14/2016  . 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[place]) 02/14/2016  . High risk medication use 02/14/2016   Past Medical History:  Diagnosis Date  . Arthralgia 02/14/2016  . Asthma   . Autoimmune disease ([place]) 02/14/2016   Positive RF 139 CCP Negative  Positive ANA 1:640 Positive Ro Positive La Elevated ESR 105 - Sjorgen's Disease  . DDD (degenerative disc disease), lumbar 02/14/2016  . Fatigue 02/14/2016  . Gastritis 02/14/2016  . Hot flashes   . Migraines 02/14/2016  . Osteoarthritis of both hands 02/14/2016  . Osteoarthritis of both knees 02/14/2016  . Vitamin D deficiency 02/14/2016    Family History  Problem Relation Age of Onset  . Kidney failure Mother   . Heart failure Mother   . Lupus Mother   . COPD Father   . Hypertension Sister   . Acromegaly Sister   . Breast cancer Paternal Aunt 69    Past Surgical History:  Procedure Laterality Date  . BREAST BIOPSY Left   .  TUBAL LIGATION     Social History   Occupational History  . Occupation: Pharmacist, hospital  Tobacco Use  . Smoking status: Former Smoker    Packs/day: 0.50    Years: 2.00    Pack years: 1.00    Types: Cigarettes    Quit date: 05/16/1986    Years since quitting: 33.2  . Smokeless tobacco: Never Used  . Tobacco comment: TE[employer]  Substance and Sexual Activity  . Alcohol use: No  . Drug use: No  . Sexual activity: Not on file

## 2019-08-05 ENCOUNTER — Ambulatory Visit: Payer: BC Managed Care – PPO | Admitting: Physical Therapy

## 2019-08-05 ENCOUNTER — Other Ambulatory Visit: Payer: Self-pay

## 2019-08-05 ENCOUNTER — Encounter: Payer: Self-pay | Admitting: Physical Therapy

## 2019-08-05 DIAGNOSIS — R600 Localized edema: Secondary | ICD-10-CM

## 2019-08-05 DIAGNOSIS — M25511 Pain in right shoulder: Secondary | ICD-10-CM | POA: Diagnosis not present

## 2019-08-05 DIAGNOSIS — M6281 Muscle weakness (generalized): Secondary | ICD-10-CM

## 2019-08-05 DIAGNOSIS — M25611 Stiffness of right shoulder, not elsewhere classified: Secondary | ICD-10-CM

## 2019-08-05 NOTE — Patient Instructions (Signed)
Access Code: [email or web address]: [email or web address]: 04/20/2021Prepared by: Arlys John NelsonExercises  Seated Shoulder Flexion Slide at Table Top with Forearm in Neutral - 2 x daily - 6 x weekly - 10 reps - 1-2 sets - 5 sec hold  Seated Shoulder Scaption Slide at Table Top with Forearm in Neutral - 2 x daily - 6 x weekly - 1- sets - 10 reps - 5 sec hold  Seated Shoulder External Rotation PROM on Table - 2-3 x daily - 6 x weekly - 10 reps - 1-2 sets - 5 hold  Standing Row with Resistance - 2 x daily - 6 x weekly - 10 reps - 2-3 sets  Standing Shoulder Extension with Resistance - 2 x daily - 6 x weekly - 10 reps - 3 sets  Shoulder Internal Rotation with Resistance - 2 x daily - 6 x weekly - 10 reps - 2-3 sets  Shoulder External Rotation with Anchored Resistance - 2 x daily - 6 x weekly - 10 reps - 2-3 sets

## 2019-08-05 NOTE — Therapy (Signed)
[facility] [address] [place], [place], [address] Phone: [phone]   Fax:  [phone]  Physical Therapy Treatment  Patient Details  Name: Megan Reid MRN: [id number] Date of Birth: [birth year] Referring Provider (PT): Marlou Sa, MD   Encounter Date: 08/05/2019  PT End of Session - 08/05/19 1115    Visit Number  4    Number of Visits  24    Date for PT Re-Evaluation  09/15/19    Authorization Type  BCBS    PT Start Time  1025    PT Stop Time  1106    PT Time Calculation (min)  41 min    Activity Tolerance  Patient tolerated treatment well    Behavior During Therapy  [facility] for tasks assessed/performed       Past Medical History:  Diagnosis Date  . Arthralgia 02/14/2016  . Asthma   . Autoimmune disease ([place]) 02/14/2016   Positive RF 139 CCP Negative  Positive ANA 1:640 Positive Ro Positive La Elevated ESR 105 - Sjorgen's Disease  . DDD (degenerative disc disease), lumbar 02/14/2016  . Fatigue 02/14/2016  . Gastritis 02/14/2016  . Hot flashes   . Migraines 02/14/2016  . Osteoarthritis of both hands 02/14/2016  . Osteoarthritis of both knees 02/14/2016  . Vitamin D deficiency 02/14/2016    Past Surgical History:  Procedure Laterality Date  . BREAST BIOPSY Left   . TUBAL LIGATION      There were no vitals filed for this visit.  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    Pain Onset  More than a month ago                       [facility] Adult PT Treatment/Exercise - 08/05/19 0001      Shoulder Exercises: Standing   External Rotation  Right    External Rotation Limitations  2 sets of 10 with red band    Internal Rotation  Right    Internal  Rotation Limitations  2 sets of 10 with red band    Flexion  AROM;Both;10 reps    Flexion Limitations  limited ROM due to weakness    Extension  Both;20 reps    Theraband Level (Shoulder Extension)  Level 3 (Green)    Row  Strengthening;Both;20 reps    Theraband Level (Shoulder Row)  Level 3 (Green)      Shoulder Exercises: Pulleys   Flexion  2 minutes    ABduction  2 minutes    Other Pulley Exercises  ER at 90 deg abd in sitting with pulleys 2 min      Shoulder Exercises: ROM/Strengthening   UBE (Upper Arm Bike)  L2 5 min total, 2.5 fwd, 2.5 retro      Shoulder Exercises: Stretch   Corner Stretch Limitations  doorway stretch for Rt shoulder ER 20 sec X 3 reps      Manual Therapy   Manual therapy comments  Rt shoulder g3-g4 inferior Rt GH jt mobs, ap jt mobs, prom c overpressure             PT Education - 08/05/19 1114    Education Details  revised HEP to add theraband RTC strength as she says she does not get enough out of isometrics.    Person(s) Educated  Patient    Methods  Explanation;Demonstration;Verbal cues;Handout    Comprehension  Verbalized understanding;Returned demonstration       PT Short Term Goals - 07/21/19 1427      PT SHORT TERM GOAL #1   Title  Pt will be I and compliant with initial HEP.    Time  4    Period  Weeks    Status  New    Target Date  08/18/19      PT SHORT TERM GOAL #2   Title  Pt will improve shoulder PROM to [facility].    Status  New        PT Long Term Goals - 07/21/19 1428      PT LONG TERM GOAL #1   Title  Pt will be I and compliant with advanced HEP. (target for all LTG 8 weeks 09/15/19    Time  8    Period  Weeks    Status  New    Target Date  09/15/19      PT LONG TERM GOAL #2   Title  Pt will improve Rt shoulder AROM to [facility].    Status  New      PT LONG TERM GOAL #3   Title  Pt will improve Rt shoulder strength to 4+/5 to improve function.      PT LONG TERM GOAL #4   Title  Pt will reduce overall pain with usual  activity and with reaching/writting to no more than 3/10 on avg    Status  New            Plan - 08/05/19 1116    Clinical Impression Statement  Continued with Rt shoulder stretghing and ROM to tolerance and she is slowly improving with this. Encouraged her to get set of pulleys at home and printed out a recommended set for her to improve shoulder ROM at home. Progressed her RTC strengthening today as tolerated. Continue POC    Personal Factors and Comorbidities  Comorbidity 2    Comorbidities  PMH: S/P RTC repair 06/16/19, autoimmune disease, lumbar DDD,    Examination-Activity Limitations  Carry;Sleep;Lift;Reach Overhead    Examination-Participation Restrictions  Cleaning;Driving;Meal Prep;Laundry    Stability/Clinical Decision Making  Evolving/Moderate complexity    Rehab Potential  Good    PT Frequency  2x / week   2-3   PT Duration  8 weeks    PT Treatment/Interventions  Electrical Stimulation;ADLs/Self Care Home Management;Iontophoresis 84m/ml Dexamethasone;Moist Heat;Ultrasound;Therapeutic activities;Therapeutic exercise;Neuromuscular re-education;Patient/family education;Manual techniques;Passive range of motion;Dry needling;Joint Manipulations;Taping    PT Next Visit Plan  Continue mobility gains, progress AAROM and RTC strength as able    PT Home Exercise Plan  Access Code: [email or web address]   Consulted and Agree with Plan of Care  Patient       Patient will benefit from skilled therapeutic intervention in order to improve the following deficits and impairments:  Decreased activity tolerance, Decreased endurance, Decreased range of motion, Decreased strength, Increased fascial restricitons, Increased muscle spasms, Impaired flexibility, Pain  Visit Diagnosis: Acute pain of right shoulder  Stiffness of right shoulder, not elsewhere classified  Muscle weakness (generalized)  Localized edema     Problem List Patient Active Problem List   Diagnosis Date Noted  . Abrasion of  right ankle 02/13/2017  . Rheumatoid factor positive 09/01/2016  . Leucopenia 09/01/2016  .  Autoimmune disease ([place]) 02/14/2016  . Fatigue 02/14/2016  . DDD (degenerative disc disease), lumbar 02/14/2016  . Osteoarthritis of both knees 02/14/2016  . Osteoarthritis of both hands 02/14/2016  . Asthma 02/14/2016  . Migraines 02/14/2016  . Gastritis 02/14/2016  . Vitamin D deficiency 02/14/2016  . 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[place]) 02/14/2016  . High risk medication use 02/14/2016    Debbe Odea, PT,DPT 08/05/2019, 11:19 AM  [facility] [address] [place], [place], [address] Phone: [phone]   Fax:  [phone]  Name: Megan Reid MRN: [id number] Date of Birth: Sep 29, 1965

## 2019-08-08 ENCOUNTER — Other Ambulatory Visit: Payer: Self-pay

## 2019-08-08 ENCOUNTER — Ambulatory Visit: Payer: BC Managed Care – PPO | Admitting: Physical Therapy

## 2019-08-08 DIAGNOSIS — M25511 Pain in right shoulder: Secondary | ICD-10-CM | POA: Diagnosis not present

## 2019-08-08 DIAGNOSIS — M6281 Muscle weakness (generalized): Secondary | ICD-10-CM

## 2019-08-08 DIAGNOSIS — R600 Localized edema: Secondary | ICD-10-CM | POA: Diagnosis not present

## 2019-08-08 DIAGNOSIS — M25611 Stiffness of right shoulder, not elsewhere classified: Secondary | ICD-10-CM

## 2019-08-08 NOTE — Therapy (Signed)
[facility] Physical Therapy [address] [place], [place], [address] Phone: [phone]   Fax:  [phone]  Physical Therapy Treatment  Patient Details  Name: Megan Reid MRN: [id number] Date of Birth: [birth year] Referring Provider (PT): Marlou Sa, MD   Encounter Date: 08/08/2019  PT End of Session - 08/08/19 1238    Visit Number  5    Number of Visits  24    Date for PT Re-Evaluation  09/15/19    Authorization Type  BCBS    PT Start Time  3343    PT Stop Time  5686    PT Time Calculation (min)  50 min    Activity Tolerance  Patient tolerated treatment well    Behavior During Therapy  [facility] for tasks assessed/performed       Past Medical History:  Diagnosis Date  . Arthralgia 02/14/2016  . Asthma   . Autoimmune disease ([place]) 02/14/2016   Positive RF 139 CCP Negative  Positive ANA 1:640 Positive Ro Positive La Elevated ESR 105 - Sjorgen's Disease  . DDD (degenerative disc disease), lumbar 02/14/2016  . Fatigue 02/14/2016  . Gastritis 02/14/2016  . Hot flashes   . Migraines 02/14/2016  . Osteoarthritis of both hands 02/14/2016  . Osteoarthritis of both knees 02/14/2016  . Vitamin D deficiency 02/14/2016    Past Surgical History:  Procedure Laterality Date  . BREAST BIOPSY Left   . TUBAL LIGATION      There were no vitals filed for this visit.  Subjective Assessment - 08/08/19 1232    Subjective  She says she feels she is improving some with her Rt shoulder, her pulleys came in today from [place] so she will start using them. 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    Pain Onset  More than a month ago                       [facility] Adult PT Treatment/Exercise - 08/08/19 0001      Shoulder Exercises: Supine   Other Supine Exercises  bilat flexion supine AAROM  and Rt ER AAROM  c 1 lb bar X 15 reps      Shoulder Exercises: Standing   External Rotation  Right    External Rotation Limitations  2 sets of 10 with red band    Internal Rotation  Right    Internal Rotation Limitations  2 sets of 10 with red band    Flexion  AROM;Both;10 reps      Shoulder Exercises: Pulleys   Flexion  2 minutes    ABduction  2 minutes    Other Pulley Exercises  ER at 90 deg abd in sitting with pulleys 2 min      Shoulder Exercises: ROM/Strengthening   UBE (Upper Arm Bike)  L2 5 min total, 2.5 fwd, 2.5 retro      Shoulder Exercises: Stretch   Corner Stretch Limitations  doorway stretch for Rt shoulder ER 20 sec X 3 reps      Vasopneumatic   Number Minutes Vasopneumatic   10 minutes    Vasopnuematic Location   Shoulder    Vasopneumatic Pressure  Medium    Vasopneumatic Temperature   34             PT  Education - 08/08/19 1237    Education Details  showed her with exercises to do with her pulleys    Person(s) Educated  Patient    Methods  Explanation;Demonstration;Verbal cues    Comprehension  Verbalized understanding;Returned demonstration       PT Short Term Goals - 07/21/19 1427      PT SHORT TERM GOAL #1   Title  Pt will be I and compliant with initial HEP.    Time  4    Period  Weeks    Status  New    Target Date  08/18/19      PT SHORT TERM GOAL #2   Title  Pt will improve shoulder PROM to [facility].    Status  New        PT Long Term Goals - 07/21/19 1428      PT LONG TERM GOAL #1   Title  Pt will be I and compliant with advanced HEP. (target for all LTG 8 weeks 09/15/19    Time  8    Period  Weeks    Status  New    Target Date  09/15/19      PT LONG TERM GOAL #2   Title  Pt will improve Rt shoulder AROM to [facility].    Status  New      PT LONG TERM GOAL #3   Title  Pt will improve Rt shoulder strength to 4+/5 to improve function.      PT LONG TERM GOAL #4   Title  Pt will reduce overall pain with usual activity and with  reaching/writting to no more than 3/10 on avg    Status  New            Plan - 08/08/19 1238    Clinical Impression Statement  Session limited some due to headache but was albe to perform ROM, Stretching, and light RTC strengthening as tolerated. She continues to lack significant Rt shoulder ROM and was instructed in different pulley exercises she can do at home with her new set of pulleys.    Personal Factors and Comorbidities  Comorbidity 2    Comorbidities  PMH: S/P RTC repair 06/16/19, autoimmune disease, lumbar DDD,    Examination-Activity Limitations  Carry;Sleep;Lift;Reach Overhead    Examination-Participation Restrictions  Cleaning;Driving;Meal Prep;Laundry    Stability/Clinical Decision Making  Evolving/Moderate complexity    Rehab Potential  Good    PT Frequency  2x / week   2-3   PT Duration  8 weeks    PT Treatment/Interventions  Electrical Stimulation;ADLs/Self Care Home Management;Iontophoresis 35m/ml Dexamethasone;Moist Heat;Ultrasound;Therapeutic activities;Therapeutic exercise;Neuromuscular re-education;Patient/family education;Manual techniques;Passive range of motion;Dry needling;Joint Manipulations;Taping    PT Next Visit Plan  Continue mobility gains, progress AAROM and RTC strength as able    PT Home Exercise Plan  Access Code: [email or web address]   Consulted and Agree with Plan of Care  Patient       Patient will benefit from skilled therapeutic intervention in order to improve the following deficits and impairments:  Decreased activity tolerance, Decreased endurance, Decreased range of motion, Decreased strength, Increased fascial restricitons, Increased muscle spasms, Impaired flexibility, Pain  Visit Diagnosis: Acute pain of right shoulder  Stiffness of right shoulder, not elsewhere classified  Muscle weakness (generalized)  Localized edema     Problem List Patient Active Problem List   Diagnosis Date Noted  . Abrasion of right ankle 02/13/2017  .  Rheumatoid factor positive 09/01/2016  . Leucopenia 09/01/2016  . Autoimmune disease (  [place]) 02/14/2016  . Fatigue 02/14/2016  . DDD (degenerative disc disease), lumbar 02/14/2016  . Osteoarthritis of both knees 02/14/2016  . Osteoarthritis of both hands 02/14/2016  . Asthma 02/14/2016  . Migraines 02/14/2016  . Gastritis 02/14/2016  . Vitamin D deficiency 02/14/2016  . 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[place]) 02/14/2016  . High risk medication use 02/14/2016    Debbe Odea 08/08/2019, 12:40 PM  [facility] Physical Therapy [address] [place], [place], [address] Phone: [phone]   Fax:  [phone]  Name: Otis Burress MRN: [id number] Date of Birth: [birth year]

## 2019-08-12 ENCOUNTER — Encounter: Payer: BC Managed Care – PPO | Admitting: Physical Therapy

## 2019-08-12 ENCOUNTER — Ambulatory Visit (INDEPENDENT_AMBULATORY_CARE_PROVIDER_SITE_OTHER): Payer: BC Managed Care – PPO | Admitting: Rehabilitative and Restorative Service Providers"

## 2019-08-12 ENCOUNTER — Encounter: Payer: Self-pay | Admitting: Rehabilitative and Restorative Service Providers"

## 2019-08-12 ENCOUNTER — Other Ambulatory Visit: Payer: Self-pay

## 2019-08-12 DIAGNOSIS — R600 Localized edema: Secondary | ICD-10-CM

## 2019-08-12 DIAGNOSIS — M25611 Stiffness of right shoulder, not elsewhere classified: Secondary | ICD-10-CM | POA: Diagnosis not present

## 2019-08-12 DIAGNOSIS — M6281 Muscle weakness (generalized): Secondary | ICD-10-CM | POA: Diagnosis not present

## 2019-08-12 DIAGNOSIS — M25511 Pain in right shoulder: Secondary | ICD-10-CM

## 2019-08-12 NOTE — Therapy (Signed)
[facility] Physical Therapy [address] [place], [place], [address] Phone: [phone]   Fax:  [phone]  Physical Therapy Treatment  Patient Details  Name: Megan Reid MRN: [id number] Date of Birth: 26-May-1965 Referring Provider (PT): Marlou Sa, MD   Encounter Date: 08/12/2019  PT End of Session - 08/12/19 1129    Visit Number  6    Number of Visits  24    Date for PT Re-Evaluation  09/15/19    Authorization Type  BCBS    PT Start Time  1106    PT Stop Time  1155    PT Time Calculation (min)  49 min    Activity Tolerance  Patient tolerated treatment well    Behavior During Therapy  [facility] for tasks assessed/performed       Past Medical History:  Diagnosis Date  . Arthralgia 02/14/2016  . Asthma   . Autoimmune disease ([place]) 02/14/2016   Positive RF 139 CCP Negative  Positive ANA 1:640 Positive Ro Positive La Elevated ESR 105 - Sjorgen's Disease  . DDD (degenerative disc disease), lumbar 02/14/2016  . Fatigue 02/14/2016  . Gastritis 02/14/2016  . Hot flashes   . Migraines 02/14/2016  . Osteoarthritis of both hands 02/14/2016  . Osteoarthritis of both knees 02/14/2016  . Vitamin D deficiency 02/14/2016    Past Surgical History:  Procedure Laterality Date  . BREAST BIOPSY Left   . TUBAL LIGATION      There were no vitals filed for this visit.  Subjective Assessment - 08/12/19 1108    Subjective  Pt. indicated feeling like she can use arm a bit better.  Pt. stated feeling some improvement in activation overall.    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  Yes    Pain Score  4     Pain Location  Shoulder    Pain Orientation  Right    Pain Descriptors / Indicators  Sore    Pain Onset  More than a month ago    Pain Frequency  Intermittent    Aggravating Factors   ER, horizontal abd end ranges,  lifting/reaching overhead.                       [facility] Adult PT Treatment/Exercise - 08/12/19 0001      Shoulder Exercises: Supine   Other Supine Exercises  bilateral wand flexion 1 lb 2 x 10 AAROM      Shoulder Exercises: Standing   External Rotation  Right;Other (comment)   3 x 10 red band   Internal Rotation  Right;Other (comment)   3 x 10 red band   Other Standing Exercises  standing ranger flexion c ipsilateral stepping fwd 2 x 10 Rt shoulder      Shoulder Exercises: Isometric Strengthening   Other Isometric Exercises  isometric flexion 10 sec x 10, abd 5 sec x 10      Vasopneumatic   Number Minutes Vasopneumatic   10 minutes    Vasopnuematic Location   Shoulder    Vasopneumatic Pressure  Medium    Vasopneumatic Temperature   34      Manual Therapy   Manual therapy comments  Rt shoulder inferior jt mobs G4, ap mobs g3-g4.  MET, pinch/stretch subscap, er  PT Short Term Goals - 07/21/19 1427      PT SHORT TERM GOAL #1   Title  Pt will be I and compliant with initial HEP.    Time  4    Period  Weeks    Status  New    Target Date  08/18/19      PT SHORT TERM GOAL #2   Title  Pt will improve shoulder PROM to [facility].    Status  New        PT Long Term Goals - 07/21/19 1428      PT LONG TERM GOAL #1   Title  Pt will be I and compliant with advanced HEP. (target for all LTG 8 weeks 09/15/19    Time  8    Period  Weeks    Status  New    Target Date  09/15/19      PT LONG TERM GOAL #2   Title  Pt will improve Rt shoulder AROM to [facility].    Status  New      PT LONG TERM GOAL #3   Title  Pt will improve Rt shoulder strength to 4+/5 to improve function.      PT LONG TERM GOAL #4   Title  Pt will reduce overall pain with usual activity and with reaching/writting to no more than 3/10 on avg    Status  New            Plan - 08/12/19 1125    Clinical Impression Statement  Anterior deltoid activation continued to struggle today  in AAROM activity.  Overall passive mobility continues to progress c noted improvement from myofascial release techniques for subscap and infraspinatus mobility.    Continued skilled PT services indicated to progress use of Rt UE.    Personal Factors and Comorbidities  Comorbidity 2    Comorbidities  PMH: S/P RTC repair 06/16/19, autoimmune disease, lumbar DDD,    Examination-Activity Limitations  Carry;Sleep;Lift;Reach Overhead    Examination-Participation Restrictions  Cleaning;Driving;Meal Prep;Laundry    Stability/Clinical Decision Making  Evolving/Moderate complexity    Rehab Potential  Good    PT Frequency  2x / week   2-3   PT Duration  8 weeks    PT Treatment/Interventions  Electrical Stimulation;ADLs/Self Care Home Management;Iontophoresis 59m/ml Dexamethasone;Moist Heat;Ultrasound;Therapeutic activities;Therapeutic exercise;Neuromuscular re-education;Patient/family education;Manual techniques;Passive range of motion;Dry needling;Joint Manipulations;Taping    PT Next Visit Plan  Continue to attempt to improve AAROM, AROM in gravity reduced positioning.    PT Home Exercise Plan  Access Code: [email or web address]   Consulted and Agree with Plan of Care  Patient       Patient will benefit from skilled therapeutic intervention in order to improve the following deficits and impairments:  Decreased activity tolerance, Decreased endurance, Decreased range of motion, Decreased strength, Increased fascial restricitons, Increased muscle spasms, Impaired flexibility, Pain  Visit Diagnosis: Acute pain of right shoulder  Stiffness of right shoulder, not elsewhere classified  Muscle weakness (generalized)  Localized edema     Problem List Patient Active Problem List   Diagnosis Date Noted  . Abrasion of right ankle 02/13/2017  . Rheumatoid factor positive 09/01/2016  . Leucopenia 09/01/2016  . [place] 02/14/2016  . Fatigue 02/14/2016  . DDD (degenerative disc disease), lumbar  02/14/2016  . Osteoarthritis of both knees 02/14/2016  . Osteoarthritis of both hands 02/14/2016  . Asthma 02/14/2016  . Migraines 02/14/2016  . Gastritis 02/14/2016  . Vitamin D deficiency 02/14/2016  .  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[place]) 02/14/2016  . High risk medication use 02/14/2016   Scot Jun, PT, DPT, OCS, ATC 08/12/19  11:50 AM    [facility] Physical Therapy [address] [place], [place], [address] Phone: [phone]   Fax:  [phone]  Name: Megan Reid MRN: [id number] Date of Birth: 07/06/1965

## 2019-08-14 ENCOUNTER — Ambulatory Visit: Payer: BC Managed Care – PPO | Admitting: Physical Therapy

## 2019-08-14 ENCOUNTER — Other Ambulatory Visit: Payer: Self-pay

## 2019-08-14 DIAGNOSIS — M25511 Pain in right shoulder: Secondary | ICD-10-CM | POA: Diagnosis not present

## 2019-08-14 DIAGNOSIS — M6281 Muscle weakness (generalized): Secondary | ICD-10-CM | POA: Diagnosis not present

## 2019-08-14 DIAGNOSIS — M25611 Stiffness of right shoulder, not elsewhere classified: Secondary | ICD-10-CM | POA: Diagnosis not present

## 2019-08-14 DIAGNOSIS — R600 Localized edema: Secondary | ICD-10-CM

## 2019-08-14 NOTE — Therapy (Signed)
[facility] Physical Therapy [address] [place], [place], [address] Phone: [phone]   Fax:  [phone]  Physical Therapy Treatment  Patient Details  Name: Megan Reid MRN: [id number] Date of Birth: 06/28/1965 Referring Provider (PT): Marlou Sa, MD   Encounter Date: 08/14/2019  PT End of Session - 08/14/19 1534    Visit Number  7    Number of Visits  24    Date for PT Re-Evaluation  09/15/19    Authorization Type  BCBS    PT Start Time  1445    PT Stop Time  1540    PT Time Calculation (min)  55 min    Activity Tolerance  Patient tolerated treatment well    Behavior During Therapy  [facility] for tasks assessed/performed       Past Medical History:  Diagnosis Date  . Arthralgia 02/14/2016  . Asthma   . Autoimmune disease ([place]) 02/14/2016   Positive RF 139 CCP Negative  Positive ANA 1:640 Positive Ro Positive La Elevated ESR 105 - Sjorgen's Disease  . DDD (degenerative disc disease), lumbar 02/14/2016  . Fatigue 02/14/2016  . Gastritis 02/14/2016  . Hot flashes   . Migraines 02/14/2016  . Osteoarthritis of both hands 02/14/2016  . Osteoarthritis of both knees 02/14/2016  . Vitamin D deficiency 02/14/2016    Past Surgical History:  Procedure Laterality Date  . BREAST BIOPSY Left   . TUBAL LIGATION      There were no vitals filed for this visit.  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    Pain Onset  More than a month ago         [facility] PT Assessment - 08/14/19 0001      Assessment   Medical Diagnosis   s/p right shoulder rotator cuff tear repair 06/16/19     Referring Provider (PT)  Marlou Sa, MD    Onset Date/Surgical Date  06/16/19    Hand Dominance  Right      ROM  / Strength   AROM / PROM / Strength  Strength      AROM   AROM Assessment Site  Shoulder    Right/Left Shoulder  Right      PROM   Right Shoulder Flexion  120 Degrees    Right Shoulder ABduction  110 Degrees (slight scaption)    Right Shoulder Internal Rotation  55 Degrees    Right Shoulder External Rotation  57 Degrees      Strength   Overall Strength Comments  Rt shoulder strength 3+/5 MMT in her available ROM                   [facility] Adult PT Treatment/Exercise - 08/14/19 0001      Shoulder Exercises: Sidelying   External Rotation  Right    External Rotation Limitations  2X15    ABduction  Right    ABduction Limitations  2X10      Shoulder Exercises: Standing   External Rotation  Right    External Rotation Limitations  3 sets of 10 with red band    Internal Rotation  Right    Internal Rotation Limitations  3 sets of  10 with red band    Other Standing Exercises  standing ranger flexion c ipsilateral stepping fwd X 10 Rt shoulder then circles X 10 CW and X 10 CCW in flexion plane    Other Standing Exercises  wall ladder X 5 reps flexion and 5 reps scaption      Shoulder Exercises: Pulleys   Flexion  2 minutes    ABduction  2 minutes    Other Pulley Exercises  ER at 90 deg abd in sitting with pulleys 2 min      Shoulder Exercises: ROM/Strengthening   UBE (Upper Arm Bike)  L3 4 min total, 2 fwd, 2 retro      Shoulder Exercises: Stretch   Corner Stretch Limitations  doorway stretch for Rt shoulder ER 20 sec X 3 reps      Vasopneumatic   Number Minutes Vasopneumatic   10 minutes    Vasopnuematic Location   Shoulder    Vasopneumatic Pressure  Medium    Vasopneumatic Temperature   34      Manual Therapy   Manual therapy comments  Rt shoulder PROM all planes and inferior jt mobs G4, ap, pa mobs g3-g4.                PT Short Term Goals - 08/14/19 1545      PT SHORT TERM GOAL #1   Title  Pt will be I and compliant with initial HEP.    Time  4     Period  Weeks    Status  Achieved    Target Date  08/18/19      PT SHORT TERM GOAL #2   Title  Pt will improve shoulder PROM to [facility].    Baseline  <75%% available    Status  On-going        PT Long Term Goals - 08/14/19 1546      PT LONG TERM GOAL #1   Title  Pt will be I and compliant with advanced HEP. (target for all LTG 8 weeks 09/15/19    Time  8    Period  Weeks    Status  On-going      PT LONG TERM GOAL #2   Title  Pt will improve Rt shoulder AROM to [facility].    Status  On-going      PT LONG TERM GOAL #3   Title  Pt will improve Rt shoulder strength to 4+/5 to improve function.    Status  On-going      PT LONG TERM GOAL #4   Title  Pt will reduce overall pain with usual activity and with reaching/writting to no more than 3/10 on avg    Status  On-going            Plan - 08/14/19 1535    Clinical Impression Statement  Measurements updated which show good progress in PROM however she is still limited by shoulder stiffness which was addressed with manual therapy and stretching program today. Also continues to be limited by weakness and PT will continue to progress this as able..    Personal Factors and Comorbidities  Comorbidity 2    Comorbidities  PMH: S/P RTC repair 06/16/19, autoimmune disease, lumbar DDD,    Examination-Activity Limitations  Carry;Sleep;Lift;Reach Overhead    Examination-Participation Restrictions  Cleaning;Driving;Meal Prep;Laundry    Stability/Clinical Decision Making  Evolving/Moderate complexity    Rehab Potential  Good    PT Frequency  2x / week   2-3  PT Duration  8 weeks    PT Treatment/Interventions  Electrical Stimulation;ADLs/Self Care Home Management;Iontophoresis 57m/ml Dexamethasone;Moist Heat;Ultrasound;Therapeutic activities;Therapeutic exercise;Neuromuscular re-education;Patient/family education;Manual techniques;Passive range of motion;Dry needling;Joint Manipulations;Taping    PT Next Visit Plan  ROM as Rt shoulder strength ,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    PT Home Exercise Plan  Access Code: [email or web address]   Consulted and Agree with Plan of Care  Patient       Patient will benefit from skilled therapeutic intervention in order to improve the following deficits and impairments:  Decreased activity tolerance, Decreased endurance, Decreased range of motion, Decreased strength, Increased fascial restricitons, Increased muscle spasms, Impaired flexibility, Pain  Visit Diagnosis: Acute pain of right shoulder  Stiffness of right shoulder, not elsewhere classified  Muscle weakness (generalized)  Localized edema     Problem List Patient Active Problem List   Diagnosis Date Noted  . Abrasion of right ankle 02/13/2017  . Rheumatoid factor positive 09/01/2016  . Leucopenia 09/01/2016  . [place] disease ([place] 02/14/2016  . Fatigue 02/14/2016  . DDD (degenerative disc disease), lumbar 02/14/2016  . Osteoarthritis of both knees 02/14/2016  . Osteoarthritis of both hands 02/14/2016  . Asthma 02/14/2016  . Migraines 02/14/2016  . Gastritis 02/14/2016  . Vitamin D deficiency 02/14/2016  . 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[place] 02/14/2016  . High risk medication use 02/14/2016    BSilvestre Mesi4/29/2021, 3:47 PM  [facility] [address] [address]Phone: [phone]  Fax:  [phone] Name: Megan EtzkornMRN: [id number] of Birth: [birth year]

## 2019-08-19 ENCOUNTER — Encounter: Payer: BC Managed Care – PPO | Admitting: Physical Therapy

## 2019-08-21 ENCOUNTER — Encounter: Payer: Self-pay | Admitting: Physical Therapy

## 2019-08-21 ENCOUNTER — Ambulatory Visit: Payer: BC Managed Care – PPO | Admitting: Physical Therapy

## 2019-08-21 ENCOUNTER — Other Ambulatory Visit: Payer: Self-pay

## 2019-08-21 DIAGNOSIS — M25611 Stiffness of right shoulder, not elsewhere classified: Secondary | ICD-10-CM

## 2019-08-21 DIAGNOSIS — M25511 Pain in right shoulder: Secondary | ICD-10-CM | POA: Diagnosis not present

## 2019-08-21 DIAGNOSIS — M6281 Muscle weakness (generalized): Secondary | ICD-10-CM | POA: Diagnosis not present

## 2019-08-21 DIAGNOSIS — R600 Localized edema: Secondary | ICD-10-CM | POA: Diagnosis not present

## 2019-08-21 NOTE — Therapy (Signed)
[facility] Physical Therapy [address] [place], [place], [address] Phone: [phone]   Fax:  [phone]  Physical Therapy Treatment  Patient Details  Name: Megan Reid MRN: [id number] Date of Birth: 1965/10/15 Referring Provider (PT): Marlou Sa, MD   Encounter Date: 08/21/2019  PT End of Session - 08/21/19 1457    Visit Number  8    Number of Visits  24    Date for PT Re-Evaluation  09/15/19    Authorization Type  BCBS    PT Start Time  0238    PT Stop Time  0316    PT Time Calculation (min)  38 min    Activity Tolerance  Patient tolerated treatment well    Behavior During Therapy  [facility] for tasks assessed/performed       Past Medical History:  Diagnosis Date  . Arthralgia 02/14/2016  . Asthma   . Autoimmune disease ([place]) 02/14/2016   Positive RF 139 CCP Negative  Positive ANA 1:640 Positive Ro Positive La Elevated ESR 105 - Sjorgen's Disease  . DDD (degenerative disc disease), lumbar 02/14/2016  . Fatigue 02/14/2016  . Gastritis 02/14/2016  . Hot flashes   . Migraines 02/14/2016  . Osteoarthritis of both hands 02/14/2016  . Osteoarthritis of both knees 02/14/2016  . Vitamin D deficiency 02/14/2016    Past Surgical History:  Procedure Laterality Date  . BREAST BIOPSY Left   . TUBAL LIGATION      There were no vitals filed for this visit.  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    Pain Onset  More than a month ago         [facility] PT Assessment - 08/21/19 0001      Assessment   Medical Diagnosis   s/p right shoulder rotator cuff tear repair 06/16/19     Referring Provider (PT)  Marlou Sa, MD    Onset  Date/Surgical Date  06/16/19      AROM   Right Shoulder Flexion  60 Degrees    Right Shoulder ABduction  60 Degrees    Right Shoulder Internal Rotation  --   L4   Right Shoulder External Rotation  --   occiput     PROM   Right Shoulder Flexion  135 Degrees    Right Shoulder ABduction  120 Degrees                   [facility] Adult PT Treatment/Exercise - 08/21/19 0001      Shoulder Exercises: Standing   External Rotation  Right    External Rotation Limitations  3 sets of 20 with red band    Internal Rotation Limitations  3 sets of 20 with red band    Extension  Both;20 reps    Theraband Level (Shoulder Extension)  Level 3 (Green)    Row  Strengthening;Both;20 reps    Theraband Level (Shoulder Row)  Level 3 (Green)    Other Standing Exercises  wall ladder X 5 reps flexion and 5 reps scaption      Shoulder Exercises: Pulleys   Flexion  2 minutes  ABduction  2 minutes    Other Pulley Exercises  ER at 90 deg abd in sitting with pulleys 2 min      Shoulder Exercises: Stretch   Corner Stretch Limitations  doorway stretch for Rt shoulder ER 20 sec X 5 reps    External Rotation Stretch  --   15 reps 5 sec table stretch standing with arm on pball     Vasopneumatic   Number Minutes Vasopneumatic   --    Vasopnuematic Location   --    Vasopneumatic Pressure  --    Vasopneumatic Temperature   --      Manual Therapy   Manual therapy comments  Rt shoulder PROM all planes and inferior jt mobs G4, ap, pa mobs g3-g4.                PT Short Term Goals - 08/14/19 1545      PT SHORT TERM GOAL #1   Title  Pt will be I and compliant with initial HEP.    Time  4    Period  Weeks    Status  Achieved    Target Date  08/18/19      PT SHORT TERM GOAL #2   Title  Pt will improve shoulder PROM to [facility].    Baseline  <75%% available    Status  On-going        PT Long Term Goals - 08/14/19 1546      PT LONG TERM GOAL #1   Title  Pt will be I and compliant with  advanced HEP. (target for all LTG 8 weeks 09/15/19    Time  8    Period  Weeks    Status  On-going      PT LONG TERM GOAL #2   Title  Pt will improve Rt shoulder AROM to [facility].    Status  On-going      PT LONG TERM GOAL #3   Title  Pt will improve Rt shoulder strength to 4+/5 to improve function.    Status  On-going      PT LONG TERM GOAL #4   Title  Pt will reduce overall pain with usual activity and with reaching/writting to no more than 3/10 on avg    Status  On-going            Plan - 08/21/19 1521    Clinical Impression Statement  again showed improvements in PROM and AROM but still limited by tightness and weakness with her Rt shoulder. PT will continue to progress this as able.    Personal Factors and Comorbidities  Comorbidity 2    Comorbidities  PMH: S/P RTC repair 06/16/19, autoimmune disease, lumbar DDD,    Examination-Activity Limitations  Carry;Sleep;Lift;Reach Overhead    Examination-Participation Restrictions  Cleaning;Driving;Meal Prep;Laundry    Stability/Clinical Decision Making  Evolving/Moderate complexity    Rehab Potential  Good    PT Frequency  2x / week   2-3   PT Duration  8 weeks    PT Treatment/Interventions  Electrical Stimulation;ADLs/Self Care Home Management;Iontophoresis 68m/ml Dexamethasone;Moist Heat;Ultrasound;Therapeutic activities;Therapeutic exercise;Neuromuscular re-education;Patient/family education;Manual techniques;Passive range of motion;Dry needling;Joint Manipulations;Taping    PT Next Visit Plan  ROM as Rt shoulder strength ,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    PT Home Exercise Plan  Access Code: [email or web address]   Consulted and Agree with Plan of Care  Patient       Patient will benefit from skilled therapeutic intervention  in order to improve the following deficits and impairments:  Decreased activity tolerance, Decreased endurance, Decreased range of motion, Decreased strength, Increased fascial restricitons, Increased muscle  spasms, Impaired flexibility, Pain  Visit Diagnosis: Acute pain of right shoulder  Stiffness of right shoulder, not elsewhere classified  Muscle weakness (generalized)  Localized edema     Problem List Patient Active Problem List   Diagnosis Date Noted  . Abrasion of right ankle 02/13/2017  . Rheumatoid factor positive 09/01/2016  . Leucopenia 09/01/2016  . Autoimmune disease ([place]) 02/14/2016  . Fatigue 02/14/2016  . DDD (degenerative disc disease), lumbar 02/14/2016  . Osteoarthritis of both knees 02/14/2016  . Osteoarthritis of both hands 02/14/2016  . Asthma 02/14/2016  . Migraines 02/14/2016  . Gastritis 02/14/2016  . Vitamin D deficiency 02/14/2016  . 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[place]) 02/14/2016  . High risk medication use 02/14/2016    Silvestre Mesi 08/21/2019, 3:23 PM  [facility] Physical Therapy [address] [place], [place], [address] Phone: [phone]   Fax:  [phone]  Name: Megan Reid MRN: [id number] Date of Birth: Feb 27, 1966

## 2019-08-25 ENCOUNTER — Other Ambulatory Visit: Payer: Self-pay | Admitting: *Deleted

## 2019-08-25 DIAGNOSIS — R0602 Shortness of breath: Secondary | ICD-10-CM

## 2019-08-25 DIAGNOSIS — J4541 Moderate persistent asthma with (acute) exacerbation: Secondary | ICD-10-CM

## 2019-08-25 MED ORDER — PANTOPRAZOLE SODIUM 40 MG PO TBEC: 40.0000 mg | DELAYED_RELEASE_TABLET | Freq: Two times a day (BID) | ORAL | 2 refills | Status: DC

## 2019-08-26 ENCOUNTER — Encounter: Payer: BC Managed Care – PPO | Admitting: Physical Therapy

## 2019-08-26 ENCOUNTER — Other Ambulatory Visit: Payer: Self-pay

## 2019-08-26 ENCOUNTER — Encounter: Payer: Self-pay | Admitting: Physical Therapy

## 2019-08-26 ENCOUNTER — Ambulatory Visit: Payer: BC Managed Care – PPO | Admitting: Physical Therapy

## 2019-08-26 DIAGNOSIS — M25611 Stiffness of right shoulder, not elsewhere classified: Secondary | ICD-10-CM

## 2019-08-26 DIAGNOSIS — M6281 Muscle weakness (generalized): Secondary | ICD-10-CM | POA: Diagnosis not present

## 2019-08-26 DIAGNOSIS — R600 Localized edema: Secondary | ICD-10-CM | POA: Diagnosis not present

## 2019-08-26 DIAGNOSIS — M25511 Pain in right shoulder: Secondary | ICD-10-CM

## 2019-08-26 NOTE — Therapy (Signed)
[facility] Physical Therapy [address] [place], [place], [address] Phone: [phone]   Fax:  [phone]  Physical Therapy Treatment  Patient Details  Name: Megan Reid MRN: [id number] Date of Birth: 03/05/1966 Referring Provider (PT): Marlou Sa, MD   Encounter Date: 08/26/2019  PT End of Session - 08/26/19 1109    Visit Number  9    Number of Visits  24    Date for PT Re-Evaluation  09/15/19    Authorization Type  BCBS    PT Start Time  1015    PT Stop Time  1105    PT Time Calculation (min)  50 min    Activity Tolerance  Patient tolerated treatment well    Behavior During Therapy  [facility] for tasks assessed/performed       Past Medical History:  Diagnosis Date  . Arthralgia 02/14/2016  . Asthma   . Autoimmune disease ([place]) 02/14/2016   Positive RF 139 CCP Negative  Positive ANA 1:640 Positive Ro Positive La Elevated ESR 105 - Sjorgen's Disease  . DDD (degenerative disc disease), lumbar 02/14/2016  . Fatigue 02/14/2016  . Gastritis 02/14/2016  . Hot flashes   . Migraines 02/14/2016  . Osteoarthritis of both hands 02/14/2016  . Osteoarthritis of both knees 02/14/2016  . Vitamin D deficiency 02/14/2016    Past Surgical History:  Procedure Laterality Date  . BREAST BIOPSY Left   . TUBAL LIGATION      There were no vitals filed for this visit.  Subjective Assessment - 08/26/19 1040    Subjective  no pain upon arrival in her Rt shoulder if she is not stretching it too far. 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    Pain Onset  More than a month ago       [facility] Adult PT Treatment/Exercise - 08/26/19 0001      Shoulder Exercises: Sidelying   ABduction  Right    ABduction Limitations  2X10      Shoulder Exercises: Standing   External Rotation  Right    External Rotation Limitations   2 sets of 20 with red band    Internal Rotation Limitations  2 sets of 20 with red band    Other Standing Exercises  standing ranger flexion c ipsilateral stepping 1 lb weight on wrist fwd X 10 Rt shoulder then circles X 10 CW and X 10 CCW in flexion plane, then H abd and add  X10    Other Standing Exercises  wall ladder X 10 reps flexion and 6 reps scaption      Shoulder Exercises: Pulleys   Flexion  2 minutes    ABduction  2 minutes      Shoulder Exercises: ROM/Strengthening   UBE (Upper Arm Bike)  L4 4 min total, 2 fwd, 2 retro      Shoulder Exercises: Stretch   Corner Stretch Limitations  doorway stretch for Rt shoulder ER 20 sec X 5 reps      Vasopneumatic   Number Minutes Vasopneumatic   10 minutes    Vasopnuematic Location   Shoulder    Vasopneumatic Pressure  Medium    Vasopneumatic Temperature   34      Manual Therapy   Manual therapy comments  Rt shoulder PROM all planes and inferior jt mobs  G4, ap, pa mobs g3-g4.                PT Short Term Goals - 08/14/19 1545      PT SHORT TERM GOAL #1   Title  Pt will be I and compliant with initial HEP.    Time  4    Period  Weeks    Status  Achieved    Target Date  08/18/19      PT SHORT TERM GOAL #2   Title  Pt will improve shoulder PROM to [facility].    Baseline  <75%% available    Status  On-going        PT Long Term Goals - 08/14/19 1546      PT LONG TERM GOAL #1   Title  Pt will be I and compliant with advanced HEP. (target for all LTG 8 weeks 09/15/19    Time  8    Period  Weeks    Status  On-going      PT LONG TERM GOAL #2   Title  Pt will improve Rt shoulder AROM to [facility].    Status  On-going      PT LONG TERM GOAL #3   Title  Pt will improve Rt shoulder strength to 4+/5 to improve function.    Status  On-going      PT LONG TERM GOAL #4   Title  Pt will reduce overall pain with usual activity and with reaching/writting to no more than 3/10 on avg    Status  On-going            Plan -  08/26/19 1109    Clinical Impression Statement  Adresed her deficits in ROM and strength with exercises and stretching as tolerated. She will need MD progress note next visit.    Personal Factors and Comorbidities  Comorbidity 2    Comorbidities  PMH: S/P RTC repair 06/16/19, autoimmune disease, lumbar DDD,    Examination-Activity Limitations  Carry;Sleep;Lift;Reach Overhead    Examination-Participation Restrictions  Cleaning;Driving;Meal Prep;Laundry    Stability/Clinical Decision Making  Evolving/Moderate complexity    Rehab Potential  Good    PT Frequency  2x / week   2-3   PT Duration  8 weeks    PT Treatment/Interventions  Electrical Stimulation;ADLs/Self Care Home Management;Iontophoresis 82m/ml Dexamethasone;Moist Heat;Ultrasound;Therapeutic activities;Therapeutic exercise;Neuromuscular re-education;Patient/family education;Manual techniques;Passive range of motion;Dry needling;Joint Manipulations;Taping    PT Next Visit Plan  ROM as Rt shoulder strength ,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    PT Home Exercise Plan  Access Code: [email or web address]   Consulted and Agree with Plan of Care  Patient       Patient will benefit from skilled therapeutic intervention in order to improve the following deficits and impairments:  Decreased activity tolerance, Decreased endurance, Decreased range of motion, Decreased strength, Increased fascial restricitons, Increased muscle spasms, Impaired flexibility, Pain  Visit Diagnosis: Acute pain of right shoulder  Stiffness of right shoulder, not elsewhere classified  Muscle weakness (generalized)  Localized edema     Problem List Patient Active Problem List   Diagnosis Date Noted  . Abrasion of right ankle 02/13/2017  . Rheumatoid factor positive 09/01/2016  . Leucopenia 09/01/2016  . Autoimmune disease ([place] 02/14/2016  . Fatigue 02/14/2016  . DDD (degenerative disc disease), lumbar 02/14/2016  . Osteoarthritis of both knees 02/14/2016  .  Osteoarthritis of both hands 02/14/2016  . Asthma 02/14/2016  . Migraines 02/14/2016  . Gastritis 02/14/2016  .  Vitamin D deficiency 02/14/2016  . 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[place]) 02/14/2016  . High risk medication use 02/14/2016    Megan Reid 08/26/2019, 11:12 AM  [facility] Physical Therapy [address] [place], [place], [address] Phone: [phone]   Fax:  [phone]  Name: Megan Reid MRN: [id number] Date of Birth: 01-05-1966

## 2019-08-28 ENCOUNTER — Ambulatory Visit: Payer: BC Managed Care – PPO | Admitting: Physical Therapy

## 2019-08-28 ENCOUNTER — Other Ambulatory Visit: Payer: Self-pay

## 2019-08-28 DIAGNOSIS — M25611 Stiffness of right shoulder, not elsewhere classified: Secondary | ICD-10-CM

## 2019-08-28 DIAGNOSIS — M25511 Pain in right shoulder: Secondary | ICD-10-CM

## 2019-08-28 DIAGNOSIS — R600 Localized edema: Secondary | ICD-10-CM | POA: Diagnosis not present

## 2019-08-28 DIAGNOSIS — M6281 Muscle weakness (generalized): Secondary | ICD-10-CM

## 2019-08-28 NOTE — Therapy (Signed)
[facility] Physical Therapy [address] [place], [place], [address] Phone: [phone]   Fax:  [phone]  Physical Therapy Treatment/MD progress note  Patient Details  Name: Megan Reid MRN: [id number] Date of Birth: 05-Apr-1966 Referring Provider (PT): Marlou Sa, MD   Encounter Date: 08/28/2019  PT End of Session - 08/28/19 1547    Visit Number  10    Number of Visits  24    Date for PT Re-Evaluation  09/15/19    Authorization Type  BCBS    PT Start Time  1430    PT Stop Time  1525    PT Time Calculation (min)  55 min    Activity Tolerance  Patient tolerated treatment well    Behavior During Therapy  [facility] for tasks assessed/performed       Past Medical History:  Diagnosis Date  . Arthralgia 02/14/2016  . Asthma   . Autoimmune disease ([place]) 02/14/2016   Positive RF 139 CCP Negative  Positive ANA 1:640 Positive Ro Positive La Elevated ESR 105 - Sjorgen's Disease  . DDD (degenerative disc disease), lumbar 02/14/2016  . Fatigue 02/14/2016  . Gastritis 02/14/2016  . Hot flashes   . Migraines 02/14/2016  . Osteoarthritis of both hands 02/14/2016  . Osteoarthritis of both knees 02/14/2016  . Vitamin D deficiency 02/14/2016    Past Surgical History:  Procedure Laterality Date  . BREAST BIOPSY Left   . TUBAL LIGATION      There were no vitals filed for this visit.  Subjective Assessment - 08/28/19 1540    Subjective  no pain upon arrival, has been working hard on HEP. 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    Pain Onset  More than a month ago         [facility] PT Assessment - 08/28/19 0001      Assessment   Medical Diagnosis   s/p right shoulder rotator cuff tear repair 06/16/19     Referring Provider (PT)  Marlou Sa, MD    Onset Date/Surgical Date  06/16/19       AROM   Right Shoulder Flexion  70 Degrees    Right Shoulder ABduction  75 Degrees      PROM   Right Shoulder Flexion  145 Degrees    Right Shoulder ABduction  150 Degrees   slight   Right Shoulder Internal Rotation  55 Degrees    Right Shoulder External Rotation  80 Degrees      Strength   Strength Assessment Site  Shoulder    Right/Left Shoulder  Right    Right Shoulder Flexion  3-/5    Right Shoulder ABduction  3-/5    Right Shoulder Internal Rotation  4+/5    Right Shoulder External Rotation  4-/5                    [facility] Adult PT Treatment/Exercise - 08/28/19 0001      Shoulder Exercises: Supine   Flexion  Right;20 reps    Shoulder Flexion Weight (lbs)  1    Other Supine Exercises  chest press Rt arm 1 lb 2X10      Shoulder Exercises: Sidelying   External Rotation  Right;20 reps    External Rotation Weight (lbs)  1  ABduction  Right    ABduction Weight (lbs)  1    ABduction Limitations  2X10      Shoulder Exercises: Standing   External Rotation  Right    External Rotation Limitations  2 sets of 20 with red band    Internal Rotation Limitations  2 sets of 20 with red band    Other Standing Exercises  standing ranger flexion c ipsilateral stepping 1 lb weight on wrist fwd X 10 Rt shoulder then circles X 10 CW and X 10 CCW in flexion plane, then H abd and add  X10    Other Standing Exercises  wall ladder X 5 reps for flexion and then scaption.      Shoulder Exercises: Pulleys   Flexion  2 minutes    ABduction  2 minutes      Shoulder Exercises: ROM/Strengthening   UBE (Upper Arm Bike)  L5, 5 min total, 2.5 fwd, 2.5 retro      Shoulder Exercises: Isometric Strengthening   ABduction Limitations  isometrics 5 sec hold X 15 reps for Rt sholder      Shoulder Exercises: IT sales professional Limitations  doorway stretch for Rt shoulder ER 20 sec X 5 reps      Vasopneumatic   Number Minutes Vasopneumatic   10 minutes    Vasopnuematic Location    Shoulder    Vasopneumatic Pressure  Medium    Vasopneumatic Temperature   34      Manual Therapy   Manual therapy comments  Rt shoulder PROM all planes and inferior jt mobs G4, ap, pa mobs g3-g4.                PT Short Term Goals - 08/28/19 1549      PT SHORT TERM GOAL #1   Title  Pt will be I and compliant with initial HEP.    Time  4    Period  Weeks    Status  Achieved    Target Date  08/18/19      PT SHORT TERM GOAL #2   Title  Pt will improve shoulder PROM to [facility].    Baseline  <75%% available    Status  Achieved        PT Long Term Goals - 08/28/19 1550      PT LONG TERM GOAL #1   Title  Pt will be I and compliant with advanced HEP. (target for all LTG 8 weeks 09/15/19    Time  8    Period  Weeks    Status  On-going      PT LONG TERM GOAL #2   Title  Pt will improve Rt shoulder AROM to [facility].    Status  On-going      PT LONG TERM GOAL #3   Title  Pt will improve Rt shoulder strength to 4+/5 to improve function.    Status  On-going      PT LONG TERM GOAL #4   Title  Pt will reduce overall pain with usual activity and with reaching/writting to no more than 3/10 on avg    Status  On-going            Plan - 08/28/19 1547    Clinical Impression Statement  She has made progress with PT in AROM, PROM, and strength but still significant deficits in strength limiting functional use of her Rt UE. She has met short term goals and is progressing toward long term goals.  She will continue to benefit from skilled PT to address these deficits. Overall doing well with pain levels.    Personal Factors and Comorbidities  Comorbidity 2    Comorbidities  PMH: S/P RTC repair 06/16/19, autoimmune disease, lumbar DDD,    Examination-Activity Limitations  Carry;Sleep;Lift;Reach Overhead    Examination-Participation Restrictions  Cleaning;Driving;Meal Prep;Laundry    Stability/Clinical Decision Making  Evolving/Moderate complexity    Rehab Potential  Good    PT Frequency   2x / week   2-3   PT Duration  8 weeks    PT Treatment/Interventions  Electrical Stimulation;ADLs/Self Care Home Management;Iontophoresis 31m/ml Dexamethasone;Moist Heat;Ultrasound;Therapeutic activities;Therapeutic exercise;Neuromuscular re-education;Patient/family education;Manual techniques;Passive range of motion;Dry needling;Joint Manipulations;Taping    PT Next Visit Plan  ROM as Rt shoulder strength ,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    PT Home Exercise Plan  Access Code: [email or web address]   Consulted and Agree with Plan of Care  Patient       Patient will benefit from skilled therapeutic intervention in order to improve the following deficits and impairments:  Decreased activity tolerance, Decreased endurance, Decreased range of motion, Decreased strength, Increased fascial restricitons, Increased muscle spasms, Impaired flexibility, Pain  Visit Diagnosis: Acute pain of right shoulder  Stiffness of right shoulder, not elsewhere classified  Muscle weakness (generalized)  Localized edema     Problem List Patient Active Problem List   Diagnosis Date Noted  . Abrasion of right ankle 02/13/2017  . Rheumatoid factor positive 09/01/2016  . Leucopenia 09/01/2016  . [place] disease [place] 02/14/[place]  . Fatigue 02/14/2016  . DDD (degenerative disc disease), lumbar 02/14/2016  . Osteoarthritis of both knees 02/14/2016  . Osteoarthritis of both hands 02/14/2016  . Asthma 02/14/2016  . Migraines 02/14/2016  . Gastritis 02/14/2016  . Vitamin D deficiency 02/14/2016  . 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 02/14/2016  . High risk medication use 02/14/2016    BSilvestre Mesi5/13/2021, 3:52 PM  [facility] [address] [address]Phone: [phone]  Fax:  [phone] Name: TAmaal DimartinoMRN: [id number] of Birth: [birth year]

## 2019-08-29 ENCOUNTER — Ambulatory Visit (INDEPENDENT_AMBULATORY_CARE_PROVIDER_SITE_OTHER): Payer: BC Managed Care – PPO | Admitting: Orthopedic Surgery

## 2019-08-29 ENCOUNTER — Encounter: Payer: Self-pay | Admitting: Orthopedic Surgery

## 2019-08-29 DIAGNOSIS — M75121 Complete rotator cuff tear or rupture of right shoulder, not specified as traumatic: Secondary | ICD-10-CM

## 2019-08-29 NOTE — Progress Notes (Signed)
Post-Op Visit Note   Patient: Megan Reid           Date of Birth: 1966-03-09           MRN: [id number] Visit Date: 08/29/2019 PCP: Patient, No Pcp Per   Assessment & Plan:  Chief Complaint:  Chief Complaint  Patient presents with  . Follow-up   Visit Diagnoses:  1. Complete tear of right rotator cuff, unspecified whether traumatic     Plan: Patient is a 54 year old female presents s/p right shoulder rotator cuff repair on 06/16/2019.  Patient notes that she is doing well with no significant complaints.  She has progressed well in the last several weeks.  She has been going to physical therapy 2 times per week as well is using a home exercise program.  They have been working on strengthening over the last 3 to 4 weeks as well as working on active range of motion.  She is now gone to the point where she is able to sleep on her right side.  She does not have to take any medications for pain.  She does note some difficulty with active range of motion but she is relatively early to the process of regaining her active range of motion and strength.  Plan to continue physical therapy and follow-up in 8 weeks for clinical recheck.  She wants to return to work on 09/04/2019.  She works as a [employer].  Plan to allow patient to return to work on that day without lifting above her waist over the next 4 months.  Patient proved plan and follow-up in 8 weeks.  Follow-Up Instructions: No follow-ups on file.   Orders:  No orders of the defined types were placed in this encounter.  No orders of the defined types were placed in this encounter.   Imaging: No results found.  PMFS History: Patient Active Problem List   Diagnosis Date Noted  . Abrasion of right ankle 02/13/2017  . Rheumatoid factor positive 09/01/2016  . Leucopenia 09/01/2016  . Autoimmune disease ([place]) 02/14/2016  . Fatigue 02/14/2016  . DDD (degenerative disc disease), lumbar 02/14/2016  . Osteoarthritis of both  knees 02/14/2016  . Osteoarthritis of both hands 02/14/2016  . Asthma 02/14/2016  . Migraines 02/14/2016  . Gastritis 02/14/2016  . Vitamin D deficiency 02/14/2016  . 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[place]) 02/14/2016  . High risk medication use 02/14/2016   Past Medical History:  Diagnosis Date  . Arthralgia 02/14/2016  . Asthma   . Autoimmune disease ([place]) 02/14/2016   Positive RF 139 CCP Negative  Positive ANA 1:640 Positive Ro Positive La Elevated ESR 105 - Sjorgen's Disease  . DDD (degenerative disc disease), lumbar 02/14/2016  . Fatigue 02/14/2016  . Gastritis 02/14/2016  . Hot flashes   . Migraines 02/14/2016  . Osteoarthritis of both hands 02/14/2016  . Osteoarthritis of both knees 02/14/2016  . Vitamin D deficiency 02/14/2016    Family History  Problem Relation Age of Onset  . Kidney failure Mother   . Heart failure Mother   . Lupus Mother   . COPD Father   . Hypertension Sister   . Acromegaly Sister   . Breast cancer Paternal Aunt 70    Past Surgical History:  Procedure Laterality Date  . BREAST BIOPSY Left   . TUBAL LIGATION     Social History   Occupational History  . Occupation: Pharmacist, hospital  Tobacco Use  . Smoking status: Former Smoker    Packs/day:  0.50    Years: 2.00    Pack years: 1.00    Types: Cigarettes    Quit date: 05/16/1986    Years since quitting: 33.3  . Smokeless tobacco: Never Used  . Tobacco comment: [employer]  Substance and Sexual Activity  . Alcohol use: No  . Drug use: No  . Sexual activity: Not on file

## 2019-09-02 ENCOUNTER — Other Ambulatory Visit: Payer: Self-pay

## 2019-09-02 ENCOUNTER — Ambulatory Visit: Payer: BC Managed Care – PPO | Admitting: Rehabilitative and Restorative Service Providers"

## 2019-09-02 ENCOUNTER — Ambulatory Visit: Payer: BC Managed Care – PPO | Attending: Family

## 2019-09-02 ENCOUNTER — Encounter: Payer: Self-pay | Admitting: Rehabilitative and Restorative Service Providers"

## 2019-09-02 DIAGNOSIS — R600 Localized edema: Secondary | ICD-10-CM

## 2019-09-02 DIAGNOSIS — M6281 Muscle weakness (generalized): Secondary | ICD-10-CM

## 2019-09-02 DIAGNOSIS — M25611 Stiffness of right shoulder, not elsewhere classified: Secondary | ICD-10-CM | POA: Diagnosis not present

## 2019-09-02 DIAGNOSIS — M25511 Pain in right shoulder: Secondary | ICD-10-CM | POA: Diagnosis not present

## 2019-09-02 DIAGNOSIS — Z23 Encounter for immunization: Secondary | ICD-10-CM

## 2019-09-02 NOTE — Therapy (Signed)
[facility] Physical Therapy [address] [place], [place], [address] Phone: [phone]   Fax:  [phone]  Physical Therapy Treatment  Patient Details  Name: Megan Reid MRN: [id number] Date of Birth: [birth year] Referring Provider (PT): Marlou Sa, MD   Encounter Date: 09/02/2019  PT End of Session - 09/02/19 1107    Visit Number  11    Number of Visits  24    Date for PT Re-Evaluation  09/15/19    Authorization Type  BCBS    PT Start Time  1103    PT Stop Time  1152    PT Time Calculation (min)  49 min    Activity Tolerance  Patient tolerated treatment well    Behavior During Therapy  [facility] for tasks assessed/performed       Past Medical History:  Diagnosis Date  . Arthralgia 02/14/2016  . Asthma   . Autoimmune disease ([place]) 02/14/2016   Positive RF 139 CCP Negative  Positive ANA 1:640 Positive Ro Positive [place] Elevated ESR 105 - Sjorgen's Disease  . DDD (degenerative disc disease), lumbar 02/14/2016  . Fatigue 02/14/2016  . Gastritis 02/14/2016  . Hot flashes   . Migraines 02/14/2016  . Osteoarthritis of both hands 02/14/2016  . Osteoarthritis of both knees 02/14/2016  . Vitamin D deficiency 02/14/2016    Past Surgical History:  Procedure Laterality Date  . BREAST BIOPSY Left   . TUBAL LIGATION      There were no vitals filed for this visit.  Subjective Assessment - 09/02/19 1109    Subjective  Pt. indicated good MD visit.  Pt. stated she felt some popping in shoulder at times that created soreness and some pain.    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  Yes    Pain Score  5     Pain Location  Shoulder    Pain Orientation  Right    Pain Descriptors / Indicators  Sore    Pain Type  Surgical pain    Pain Onset  More than a month ago    Aggravating Factors   popping in elevation at times.                         [place] Adult PT Treatment/Exercise - 09/02/19 0001      Neuro Re-ed    Neuro Re-ed Details   ER/IR in 45 deg scaption, flexion 100 deg rhythmic stabilizations c very mild resistances.       Shoulder Exercises: Supine   Flexion  Right   unweighted to fatigue 2:15   Other Supine Exercises  supine 90 deg flexion clockwise, counter clockwise circles 20x each      Shoulder Exercises: Sidelying   External Rotation  Right   3 x 10   ABduction  Right   3 x 10     Shoulder Exercises: Pulleys   Flexion  3 minutes    Scaption  3 minutes      Shoulder Exercises: ROM/Strengthening   UBE (Upper Arm Bike)  Lvl 5 3 mins fwd/rev each      Vasopneumatic   Number Minutes Vasopneumatic   10 minutes    Vasopnuematic Location   Shoulder    Vasopneumatic Pressure  Medium    Vasopneumatic Temperature   34  Manual Therapy   Manual therapy comments  contract/relax for IR mobility in supine, g2-g3 inferior mobs             PT Education - 09/02/19 1134    Education Details  Intervention cues    Person(s) Educated  Patient    Methods  Explanation;Verbal cues    Comprehension  Verbalized understanding;Returned demonstration       PT Short Term Goals - 08/28/19 1549      PT SHORT TERM GOAL #1   Title  Pt will be I and compliant with initial HEP.    Time  4    Period  Weeks    Status  Achieved    Target Date  08/18/19      PT SHORT TERM GOAL #2   Title  Pt will improve shoulder PROM to [facility].    Baseline  <75%% available    Status  Achieved        PT Long Term Goals - 08/28/19 1550      PT LONG TERM GOAL #1   Title  Pt will be I and compliant with advanced HEP. (target for all LTG 8 weeks 09/15/19    Time  8    Period  Weeks    Status  On-going      PT LONG TERM GOAL #2   Title  Pt will improve Rt shoulder AROM to [facility].    Status  On-going      PT LONG TERM GOAL #3   Title  Pt will improve Rt shoulder strength to 4+/5 to improve  function.    Status  On-going      PT LONG TERM GOAL #4   Title  Pt will reduce overall pain with usual activity and with reaching/writting to no more than 3/10 on avg    Status  On-going            Plan - 09/02/19 1134    Clinical Impression Statement  Pt. to benefit from muscle activation and neuro-red for coordination response to arm positioning in space as well as outside force application to help improve biomechanical control for elevation attempts and further strengthening of Rt UE.    Personal Factors and Comorbidities  Comorbidity 2    Comorbidities  PMH: S/P RTC repair 06/16/19, autoimmune disease, lumbar DDD,    Examination-Activity Limitations  Carry;Sleep;Lift;Reach Overhead    Examination-Participation Restrictions  Cleaning;Driving;Meal Prep;Laundry    Stability/Clinical Decision Making  Evolving/Moderate complexity    Rehab Potential  Good    PT Frequency  2x / week   2-3   PT Duration  8 weeks    PT Treatment/Interventions  Electrical Stimulation;ADLs/Self Care Home Management;Iontophoresis 46m/ml Dexamethasone;Moist Heat;Ultrasound;Therapeutic activities;Therapeutic exercise;Neuromuscular re-education;Patient/family education;Manual techniques;Passive range of motion;Dry needling;Joint Manipulations;Taping    PT Next Visit Plan  Rhythmic stabilizations, endurance and strengthening. Improved gravity reduced activity prior to elevation against gravity.    PT Home Exercise Plan  Access Code: [email or web address]   Consulted and Agree with Plan of Care  Patient       Patient will benefit from skilled therapeutic intervention in order to improve the following deficits and impairments:  Decreased activity tolerance, Decreased endurance, Decreased range of motion, Decreased strength, Increased fascial restricitons, Increased muscle spasms, Impaired flexibility, Pain  Visit Diagnosis: Acute pain of right shoulder  Stiffness of right shoulder, not elsewhere classified  Muscle  weakness (generalized)  Localized edema     Problem List Patient Active Problem List  Diagnosis Date Noted  . Abrasion of right ankle 02/13/2017  . Rheumatoid factor positive 09/01/2016  . Leucopenia 09/01/2016  . Autoimmune disease ([place]) 02/14/2016  . Fatigue 02/14/2016  . DDD (degenerative disc disease), lumbar 02/14/2016  . Osteoarthritis of both knees 02/14/2016  . Osteoarthritis of both hands 02/14/2016  . Asthma 02/14/2016  . Migraines 02/14/2016  . Gastritis 02/14/2016  . Vitamin D deficiency 02/14/2016  . 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[place]) 02/14/2016  . High risk medication use 02/14/2016    Scot Jun, PT, DPT, OCS, ATC 09/02/19  11:43 AM    [facility] Physical Therapy [address] [place], [place], [address] Phone: [phone]   Fax:  [phone]  Name: Rayann Jolley MRN: [id number] Date of Birth: 04-30-1965

## 2019-09-02 NOTE — Progress Notes (Signed)
   Covid-19 Vaccination Clinic  Name:  Megan Reid    MRN: [id number] DOB: 1965/09/24  09/02/2019  [name] was observed post Covid-19 immunization for 15 minutes without incident. She was provided with Vaccine Information Sheet and instruction to access the V-Safe system.   [name] was instructed to call 911 with any severe reactions post vaccine: Marland Kitchen Difficulty breathing  . Swelling of face and throat  . A fast heartbeat  . A bad rash all over body  . Dizziness and weakness   Immunizations Administered    Name Date Dose VIS Date Route   Moderna COVID-19 Vaccine 09/02/2019  1:23 PM 0.5 mL 03/2019 Intramuscular   Manufacturer: Moderna   Lot: [id number]   NDC: 10071-219-75

## 2019-09-18 ENCOUNTER — Encounter: Payer: BC Managed Care – PPO | Admitting: Rehabilitative and Restorative Service Providers"

## 2019-09-23 ENCOUNTER — Other Ambulatory Visit: Payer: Self-pay

## 2019-09-23 ENCOUNTER — Ambulatory Visit: Payer: BC Managed Care – PPO | Admitting: Rehabilitative and Restorative Service Providers"

## 2019-09-23 ENCOUNTER — Encounter: Payer: Self-pay | Admitting: Rehabilitative and Restorative Service Providers"

## 2019-09-23 DIAGNOSIS — M25511 Pain in right shoulder: Secondary | ICD-10-CM | POA: Diagnosis not present

## 2019-09-23 DIAGNOSIS — M25611 Stiffness of right shoulder, not elsewhere classified: Secondary | ICD-10-CM

## 2019-09-23 DIAGNOSIS — R600 Localized edema: Secondary | ICD-10-CM | POA: Diagnosis not present

## 2019-09-23 DIAGNOSIS — M6281 Muscle weakness (generalized): Secondary | ICD-10-CM | POA: Diagnosis not present

## 2019-09-23 NOTE — Therapy (Signed)
[facility] Physical Therapy [address] [place], [place], [address] Phone: [phone]   Fax:  [phone]  Physical Therapy Treatment/Progress Note/Recert POC  Patient Details  Name: Megan Reid MRN: [id number] Date of Birth: Sep 24, 1965 Referring Provider (PT): Marlou Sa, MD  Progress Note Reporting Period 08/28/19 to 09/23/2019  See note below for Objective Data and Assessment of Progress/Goals.       Encounter Date: 09/23/2019  PT End of Session - 09/23/19 1110    Visit Number  12    Number of Visits  23    Date for PT Re-Evaluation  11/04/19    Authorization Type  BCBS    Authorization Time Period  09/23/2019 - 11/04/2019 (new POC order)    PT Start Time  1105    PT Stop Time  1155    PT Time Calculation (min)  50 min    Activity Tolerance  Patient tolerated treatment well    Behavior During Therapy  [facility] for tasks assessed/performed       Past Medical History:  Diagnosis Date  . Arthralgia 02/14/2016  . Asthma   . Autoimmune disease ([place]) 02/14/2016   Positive RF 139 CCP Negative  Positive ANA 1:640 Positive Ro Positive [place] Elevated ESR 105 - Sjorgen's Disease  . DDD (degenerative disc disease), lumbar 02/14/2016  . Fatigue 02/14/2016  . Gastritis 02/14/2016  . Hot flashes   . Migraines 02/14/2016  . Osteoarthritis of both hands 02/14/2016  . Osteoarthritis of both knees 02/14/2016  . Vitamin D deficiency 02/14/2016    Past Surgical History:  Procedure Laterality Date  . BREAST BIOPSY Left   . TUBAL LIGATION      There were no vitals filed for this visit.  Subjective Assessment - 09/23/19 1108    Subjective  Pt. stated having occasional sharp pain at times.  Pt. indicated while at work she would feel burning at times and fatigue.  Also waking due to pain at times.  Pt. stated tenderness to touch on anterior shoulder.  Pt. stated not performing exercise in last weekend due to symptoms with some mild improvements c rest.    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  Yes    Pain Score  1    pain at worst 8/10   Pain Location  Shoulder    Pain Orientation  Right    Pain Descriptors / Indicators  Burning;Sore    Pain Onset  More than a month ago    Aggravating Factors   reaching, sustained activity, exercise at times, tenderness to touch.    Pain Relieving Factors  rest         [facility] PT Assessment - 09/23/19 0001      Assessment   Medical Diagnosis   s/p right shoulder rotator cuff tear repair 06/16/19     Referring Provider (PT)  Marlou Sa, MD    Onset Date/Surgical Date  06/16/19    Hand Dominance  Right      Prior Function   Level of Independence  Independent      AROM   Overall AROM Comments  end range pain noted all directions, mid range symptoms noted in flexion    Right Shoulder Flexion  138 Degrees    Right Shoulder ABduction  125 Degrees    Right Shoulder Internal Rotation  60 Degrees    Right Shoulder External Rotation  55 Degrees      PROM   Overall PROM Comments  end range symptoms noted    Right Shoulder Flexion  138 Degrees    Right Shoulder ABduction  125 Degrees    Right Shoulder Internal Rotation  60 Degrees    Right Shoulder External Rotation  55 Degrees      Strength   Right Shoulder Flexion  2+/5   unable to complete against gravity, shrug noted     Palpation   Palpation comment  TrP and concordant pain Rt anterior deltoid.  TrP in Rt infraspinatus, sub scap and lat                    [facility] Adult PT Treatment/Exercise - 09/23/19 0001      Shoulder Exercises: Supine   Flexion  Right   2 x 10   Other Supine Exercises  supine wand flexion 1 lb 2 x 10    Other Supine Exercises  supine 90 deg flexion circle cw, ccw 2 x 30 each Rt UE      Shoulder Exercises: Pulleys   Flexion  2 minutes    Scaption  2 minutes      Vasopneumatic    Number Minutes Vasopneumatic   10 minutes    Vasopnuematic Location   Shoulder    Vasopneumatic Pressure  Medium    Vasopneumatic Temperature   34      Manual Therapy   Manual therapy comments  active compression to anterior deltoid Rt, MET contract/relax for ir/er, g3 inferior jt mobs               PT Short Term Goals - 08/28/19 1549      PT SHORT TERM GOAL #1   Title  Pt will be I and compliant with initial HEP.    Time  4    Period  Weeks    Status  Achieved    Target Date  08/18/19      PT SHORT TERM GOAL #2   Title  Pt will improve shoulder PROM to [facility].    Baseline  <75%% available    Status  Achieved        PT Long Term Goals - 09/23/19 1133      PT LONG TERM GOAL #1   Title  Pt will be I and compliant with advanced HEP. (target for all LTG 8 weeks 09/15/19    Time  6    Period  Weeks    Status  On-going    Target Date  11/04/19      PT LONG TERM GOAL #2   Title  Pt will improve Rt shoulder AROM to [facility]    Time  6    Period  Weeks    Status  On-going    Target Date  11/04/19      PT LONG TERM GOAL #3   Title  Pt will improve Rt shoulder strength to 4+/5 to improve function.    Period  Weeks    Status  On-going    Target Date  11/04/19      PT LONG TERM GOAL #4   Title  Pt will reduce overall pain with usual activity and with reaching/writting to no more than 3/10 on avg    Time  6    Period  Weeks    Status  On-going    Target Date  11/04/19  Plan - 09/23/19 1120    Clinical Impression Statement  Pt. has attended 12 visits overall during course of treatment.  More recently, Pt. returned to clinic today indicating aggravation of anterior Rt GH jt symptoms c lifting and movement since return to work.  See objective data for updated information.  Pt. continued to demonstrated Rt [place] jt mobility deficits, strength deficits which impair daily activity from PLOF s limitation at this time.  Recommend continued skilled PT services c  improved adherence to POC in clinic to help progress towards goals and PLOF.    Personal Factors and Comorbidities  Comorbidity 2    Comorbidities  PMH: S/P RTC repair 06/16/19, autoimmune disease, lumbar DDD,    Examination-Activity Limitations  Carry;Sleep;Lift;Reach Overhead    Examination-Participation Restrictions  Cleaning;Driving;Meal Prep;Laundry    Stability/Clinical Decision Making  Evolving/Moderate complexity    Clinical Decision Making  Moderate    Rehab Potential  Good    PT Frequency  2x / week   2-3   PT Duration  6 weeks    PT Treatment/Interventions  Electrical Stimulation;ADLs/Self Care Home Management;Iontophoresis 72m/ml Dexamethasone;Moist Heat;Ultrasound;Therapeutic activities;Therapeutic exercise;Neuromuscular re-education;Patient/family education;Manual techniques;Passive range of motion;Dry needling;Joint Manipulations;Taping    PT Next Visit Plan  2x/week for 6 weeks recommended to improve end range mobility, Rt GH strength    PT Home Exercise Plan  Access Code: [email or web address]   Consulted and Agree with Plan of Care  Patient       Patient will benefit from skilled therapeutic intervention in order to improve the following deficits and impairments:  Decreased activity tolerance, Decreased endurance, Decreased range of motion, Decreased strength, Increased fascial restricitons, Increased muscle spasms, Impaired flexibility, Pain  Visit Diagnosis: Acute pain of right shoulder  Stiffness of right shoulder, not elsewhere classified  Muscle weakness (generalized)  Localized edema     Problem List Patient Active Problem List   Diagnosis Date Noted  . Abrasion of right ankle 02/13/2017  . Rheumatoid factor positive 09/01/2016  . Leucopenia 09/01/2016  . [place]immune disease ([place] 02/14/2016  . Fatigue 02/14/2016  . DDD (degenerative disc disease), lumbar 02/14/2016  . Osteoarthritis of both knees 02/14/2016  . Osteoarthritis of both hands 02/14/2016  . Asthma  02/14/2016  . Migraines 02/14/2016  . Gastritis 02/14/2016  . Vitamin D deficiency 02/14/2016  . 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 02/14/2016  . High risk medication use 02/14/2016   MScot Jun PT, DPT, OCS, ATC 09/23/19  11:39 AM    [facility] [address] [address]Phone: [phone]  Fax:  [phone] Name: Megan RobeckMRN: [id number] of Birth: 5Mar 24, 1967

## 2019-09-24 ENCOUNTER — Other Ambulatory Visit: Payer: Self-pay | Admitting: Internal Medicine

## 2019-09-24 DIAGNOSIS — J4520 Mild intermittent asthma, uncomplicated: Secondary | ICD-10-CM

## 2019-09-24 NOTE — Telephone Encounter (Signed)
Requested  medications are  due for refill today yes  Requested medications are on the active medication list yes  Last refill 04/15/18  Future visit scheduled no  Last visit 04/26/2018  Notes to clinic failed protocol due to no visit within 12 months

## 2019-09-25 ENCOUNTER — Other Ambulatory Visit: Payer: Self-pay

## 2019-09-25 ENCOUNTER — Encounter: Payer: Self-pay | Admitting: Rehabilitative and Restorative Service Providers"

## 2019-09-25 ENCOUNTER — Ambulatory Visit: Payer: BC Managed Care – PPO | Admitting: Rehabilitative and Restorative Service Providers"

## 2019-09-25 DIAGNOSIS — M25611 Stiffness of right shoulder, not elsewhere classified: Secondary | ICD-10-CM

## 2019-09-25 DIAGNOSIS — M25511 Pain in right shoulder: Secondary | ICD-10-CM

## 2019-09-25 DIAGNOSIS — R600 Localized edema: Secondary | ICD-10-CM | POA: Diagnosis not present

## 2019-09-25 DIAGNOSIS — M6281 Muscle weakness (generalized): Secondary | ICD-10-CM

## 2019-09-25 NOTE — Telephone Encounter (Signed)
Sorry, I did not see that someone had already routed this to you. I will refuse with pharmacy due to no PCP. Thanks

## 2019-09-25 NOTE — Telephone Encounter (Signed)
Not a patient at this practice

## 2019-09-25 NOTE — Telephone Encounter (Signed)
Requested  medications are  due for refill today yes  Requested medications are on the active medication list yes  Last refill 04/15/2018  Future visit scheduled no  Notes to clinic Failed protocol due to last visit more than a year ago.

## 2019-09-25 NOTE — Therapy (Signed)
[facility] Physical Therapy [address] [place], [place], [address] Phone: [phone]   Fax:  [phone]  Physical Therapy Treatment  Patient Details  Name: Megan Reid MRN: [id number] Date of Birth: 1965-04-19 Referring Provider (PT): Marlou Sa, MD   Encounter Date: 09/25/2019   PT End of Session - 09/25/19 1056    Visit Number 13    Number of Visits 23    Date for PT Re-Evaluation 11/04/19    Authorization Type BCBS    Authorization Time Period 09/23/2019 - 11/04/2019 (new POC order)    PT Start Time 1056    PT Stop Time 1146    PT Time Calculation (min) 50 min    Activity Tolerance Patient tolerated treatment well    Behavior During Therapy [facility] for tasks assessed/performed           Past Medical History:  Diagnosis Date  . Arthralgia 02/14/2016  . Asthma   . Autoimmune disease ([place]) 02/14/2016   Positive RF 139 CCP Negative  Positive ANA 1:640 Positive Ro Positive La Elevated ESR 105 - Sjorgen's Disease  . DDD (degenerative disc disease), lumbar 02/14/2016  . Fatigue 02/14/2016  . Gastritis 02/14/2016  . Hot flashes   . Migraines 02/14/2016  . Osteoarthritis of both hands 02/14/2016  . Osteoarthritis of both knees 02/14/2016  . Vitamin D deficiency 02/14/2016    Past Surgical History:  Procedure Laterality Date  . BREAST BIOPSY Left   . TUBAL LIGATION      There were no vitals filed for this visit.   Subjective Assessment - 09/25/19 1059    Subjective Pt. stated doing much better since last visit with less pain reported.    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 No/denies    Pain Score 0-No pain    Pain Location Shoulder    Pain Onset More than a month ago    Aggravating Factors  no aggravation since last visit.                             [facility] Adult PT  Treatment/Exercise - 09/25/19 0001      Neuro Re-ed    Neuro Re-ed Details  ER/IR in 45 deg scaption, flexion 100 deg rhythmic stabilizations c very mild resistances.       Shoulder Exercises: Supine   Flexion Right;Other (comment)   to fatigue x 25   Other Supine Exercises supine wand flexion 1 lb 2 x 10      Shoulder Exercises: Sidelying   External Rotation Right;Other (comment)   3 x 15   External Rotation Weight (lbs) 2    ABduction AROM;Strengthening;Right   3 x 15   ABduction Weight (lbs) 1      Shoulder Exercises: Standing   Other Standing Exercises gh ext, rows green 3 x 15 each      Shoulder Exercises: Pulleys   Flexion 2 minutes    Scaption 2 minutes      Shoulder Exercises: ROM/Strengthening   UBE (Upper Arm Bike) lvl 2.5 3 mins each fwd/rev      Vasopneumatic   Number Minutes Vasopneumatic  10 minutes    Vasopnuematic Location  Shoulder    Vasopneumatic Pressure Medium    Vasopneumatic Temperature  34      Manual Therapy  Manual therapy comments active compression to anterior deltoid Rt, MET contract/relax for ir/er, g3 inferior jt mobs                    PT Short Term Goals - 08/28/19 1549      PT SHORT TERM GOAL #1   Title Pt will be I and compliant with initial HEP.    Time 4    Period Weeks    Status Achieved    Target Date 08/18/19      PT SHORT TERM GOAL #2   Title Pt will improve shoulder PROM to [facility].    Baseline <75%% available    Status Achieved             PT Long Term Goals - 09/23/19 1133      PT LONG TERM GOAL #1   Title Pt will be I and compliant with advanced HEP. (target for all LTG 8 weeks 09/15/19    Time 6    Period Weeks    Status On-going    Target Date 11/04/19      PT LONG TERM GOAL #2   Title Pt will improve Rt shoulder AROM to [facility].    Time 6    Period Weeks    Status On-going    Target Date 11/04/19      PT LONG TERM GOAL #3   Title Pt will improve Rt shoulder strength to 4+/5 to improve function.      Period Weeks    Status On-going    Target Date 11/04/19      PT LONG TERM GOAL #4   Title Pt will reduce overall pain with usual activity and with reaching/writting to no more than 3/10 on avg    Time 6    Period Weeks    Status On-going    Target Date 11/04/19                 Plan - 09/25/19 1138    Clinical Impression Statement Pain reported down today c positive improvement from myofasical release techniques.  Continued strengtheningindicated at this time to improve daily activity.    Personal Factors and Comorbidities Comorbidity 2    Comorbidities PMH: S/P RTC repair 06/16/19, autoimmune disease, lumbar DDD,    Examination-Activity Limitations Carry;Sleep;Lift;Reach Overhead    Examination-Participation Restrictions Cleaning;Driving;Meal Prep;Laundry    Stability/Clinical Decision Making Evolving/Moderate complexity    Rehab Potential Good    PT Frequency 2x / week   2-3   PT Duration 6 weeks    PT Treatment/Interventions Electrical Stimulation;ADLs/Self Care Home Management;Iontophoresis 59m/ml Dexamethasone;Moist Heat;Ultrasound;Therapeutic activities;Therapeutic exercise;Neuromuscular re-education;Patient/family education;Manual techniques;Passive range of motion;Dry needling;Joint Manipulations;Taping    PT Next Visit Plan Myofascial release as needed, progress strengthening, arom    PT Home Exercise Plan Access Code: [email or web address]   Consulted and Agree with Plan of Care Patient           Patient will benefit from skilled therapeutic intervention in order to improve the following deficits and impairments:  Decreased activity tolerance, Decreased endurance, Decreased range of motion, Decreased strength, Increased fascial restricitons, Increased muscle spasms, Impaired flexibility, Pain  Visit Diagnosis: Acute pain of right shoulder  Stiffness of right shoulder, not elsewhere classified  Muscle weakness (generalized)  Localized edema     Problem List Patient  Active Problem List   Diagnosis Date Noted  . Abrasion of right ankle 02/13/2017  . Rheumatoid factor positive 09/01/2016  . Leucopenia 09/01/2016  . Autoimmune  disease ([place]) 02/14/2016  . Fatigue 02/14/2016  . DDD (degenerative disc disease), lumbar 02/14/2016  . Osteoarthritis of both knees 02/14/2016  . Osteoarthritis of both hands 02/14/2016  . Asthma 02/14/2016  . Migraines 02/14/2016  . Gastritis 02/14/2016  . Vitamin D deficiency 02/14/2016  . 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[place]) 02/14/2016  . High risk medication use 02/14/2016   Scot Jun, PT, DPT, OCS, ATC 09/25/19  11:40 AM     [facility] Physical Therapy [address] [place], [place], [address] Phone: [phone]   Fax:  [phone]  Name: Tiffanny Lamarche MRN: [id number] Date of Birth: [birth year]

## 2019-09-29 ENCOUNTER — Encounter: Payer: BC Managed Care – PPO | Admitting: Physical Therapy

## 2019-10-01 ENCOUNTER — Ambulatory Visit: Payer: BC Managed Care – PPO | Admitting: Physical Therapy

## 2019-10-01 ENCOUNTER — Other Ambulatory Visit: Payer: Self-pay

## 2019-10-01 DIAGNOSIS — R600 Localized edema: Secondary | ICD-10-CM

## 2019-10-01 DIAGNOSIS — M25511 Pain in right shoulder: Secondary | ICD-10-CM

## 2019-10-01 DIAGNOSIS — M25611 Stiffness of right shoulder, not elsewhere classified: Secondary | ICD-10-CM | POA: Diagnosis not present

## 2019-10-01 DIAGNOSIS — M6281 Muscle weakness (generalized): Secondary | ICD-10-CM

## 2019-10-01 NOTE — Therapy (Signed)
[facility] Physical Therapy [address] [place], [place], [address] Phone: [phone]   Fax:  [phone]  Physical Therapy Treatment  Patient Details  Name: Megan Reid MRN: [id number] Date of Birth: 07-Jul-1965 Referring Provider (PT): Marlou Sa, MD   Encounter Date: 10/01/2019   PT End of Session - 10/01/19 1147    Visit Number 14    Number of Visits 23    Date for PT Re-Evaluation 11/04/19    Authorization Type BCBS    Authorization Time Period 09/23/2019 - 11/04/2019 (new POC order)    PT Start Time 1102    PT Stop Time 1153    PT Time Calculation (min) 51 min    Activity Tolerance Patient tolerated treatment well    Behavior During Therapy [facility] for tasks assessed/performed           Past Medical History:  Diagnosis Date  . Arthralgia 02/14/2016  . Asthma   . Autoimmune disease ([place]) 02/14/2016   Positive RF 139 CCP Negative  Positive ANA 1:640 Positive Ro Positive La Elevated ESR 105 - Sjorgen's Disease  . DDD (degenerative disc disease), lumbar 02/14/2016  . Fatigue 02/14/2016  . Gastritis 02/14/2016  . Hot flashes   . Migraines 02/14/2016  . Osteoarthritis of both hands 02/14/2016  . Osteoarthritis of both knees 02/14/2016  . Vitamin D deficiency 02/14/2016    Past Surgical History:  Procedure Laterality Date  . BREAST BIOPSY Left   . TUBAL LIGATION      There were no vitals filed for this visit.   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    Pain Onset More than a month ago              [facility] Adult PT Treatment/Exercise - 10/01/19 0001      Shoulder Exercises: Supine   External Rotation AAROM;20 reps    External Rotation Weight (lbs) 3    Flexion AAROM;Both;20 reps     Shoulder Flexion Weight (lbs) 3    ABduction AAROM;20 reps    Shoulder ABduction Weight (lbs) 3      Shoulder Exercises: Sidelying   External Rotation Right    External Rotation Weight (lbs) 2    External Rotation Limitations 3X15    ABduction AAROM;Right    ABduction Weight (lbs) 1    ABduction Limitations 3X10      Shoulder Exercises: Standing   Flexion AROM;Both    Flexion Limitations 2X10, shrug sign    ABduction Both    ABduction Limitations 2X10, shrug sign      Shoulder Exercises: Pulleys   Flexion 2 minutes    Scaption 2 minutes      Shoulder Exercises: ROM/Strengthening   UBE (Upper Arm Bike) lvl 3.5 2 mins each fwd/rev      Vasopneumatic   Number Minutes Vasopneumatic  10 minutes    Vasopnuematic Location  Shoulder    Vasopneumatic Pressure Medium    Vasopneumatic Temperature  34      Manual Therapy   Manual therapy comments Rt shoulder PROM all planes and GH mobs inferior, distraciton, A-P, P-A grade 3-4  PT Short Term Goals - 08/28/19 1549      PT SHORT TERM GOAL #1   Title Pt will be I and compliant with initial HEP.    Time 4    Period Weeks    Status Achieved    Target Date 08/18/19      PT SHORT TERM GOAL #2   Title Pt will improve shoulder PROM to [facility].    Baseline <75%% available    Status Achieved             PT Long Term Goals - 09/23/19 1133      PT LONG TERM GOAL #1   Title Pt will be I and compliant with advanced HEP. (target for all LTG 8 weeks 09/15/19    Time 6    Period Weeks    Status On-going    Target Date 11/04/19      PT LONG TERM GOAL #2   Title Pt will improve Rt shoulder AROM to [facility].    Time 6    Period Weeks    Status On-going    Target Date 11/04/19      PT LONG TERM GOAL #3   Title Pt will improve Rt shoulder strength to 4+/5 to improve function.    Period Weeks    Status On-going    Target Date 11/04/19      PT LONG TERM GOAL #4   Title Pt will reduce overall pain with usual  activity and with reaching/writting to no more than 3/10 on avg    Time 6    Period Weeks    Status On-going    Target Date 11/04/19                 Plan - 10/01/19 1152    Clinical Impression Statement ROM improving some but still with deficits and still with significant shoulder weakness and shrug sign compensation. PT will continue to progress her as able.    Personal Factors and Comorbidities Comorbidity 2    Comorbidities PMH: S/P RTC repair 06/16/19, autoimmune disease, lumbar DDD,    Examination-Activity Limitations Carry;Sleep;Lift;Reach Overhead    Examination-Participation Restrictions Cleaning;Driving;Meal Prep;Laundry    Stability/Clinical Decision Making Evolving/Moderate complexity    Rehab Potential Good    PT Frequency 2x / week   2-3   PT Duration 6 weeks    PT Treatment/Interventions Electrical Stimulation;ADLs/Self Care Home Management;Iontophoresis 31m/ml Dexamethasone;Moist Heat;Ultrasound;Therapeutic activities;Therapeutic exercise;Neuromuscular re-education;Patient/family education;Manual techniques;Passive range of motion;Dry needling;Joint Manipulations;Taping    PT Next Visit Plan Myofascial release as needed, progress strengthening, arom    PT Home Exercise Plan Access Code: [email or web address]   Consulted and Agree with Plan of Care Patient           Patient will benefit from skilled therapeutic intervention in order to improve the following deficits and impairments:  Decreased activity tolerance, Decreased endurance, Decreased range of motion, Decreased strength, Increased fascial restricitons, Increased muscle spasms, Impaired flexibility, Pain  Visit Diagnosis: Acute pain of right shoulder  Stiffness of right shoulder, not elsewhere classified  Muscle weakness (generalized)  Localized edema     Problem List Patient Active Problem List   Diagnosis Date Noted  . Abrasion of right ankle 02/13/2017  . Rheumatoid factor positive 09/01/2016  .  Leucopenia 09/01/2016  . Autoimmune disease ([place] 02/14/2016  . Fatigue 02/14/2016  . DDD (degenerative disc disease), lumbar 02/14/2016  . Osteoarthritis of both knees 02/14/2016  . Osteoarthritis of both hands 02/14/2016  . Asthma 02/14/2016  .  Migraines 02/14/2016  . Gastritis 02/14/2016  . Vitamin D deficiency 02/14/2016  . 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[place]) 02/14/2016  . High risk medication use 02/14/2016    Debbe Odea, PT,DPT 10/01/2019, 11:53 AM  [facility] Physical Therapy [address] [place], [place], [address] Phone: [phone]   Fax:  [phone]  Name: Sheritha Louis MRN: [id number] Date of Birth: [birth year]

## 2019-10-02 ENCOUNTER — Other Ambulatory Visit: Payer: Self-pay | Admitting: Surgical

## 2019-10-02 NOTE — Telephone Encounter (Signed)
Pls advise.  

## 2019-10-07 ENCOUNTER — Other Ambulatory Visit: Payer: Self-pay

## 2019-10-07 ENCOUNTER — Ambulatory Visit: Payer: BC Managed Care – PPO | Admitting: Physical Therapy

## 2019-10-07 DIAGNOSIS — M25611 Stiffness of right shoulder, not elsewhere classified: Secondary | ICD-10-CM

## 2019-10-07 DIAGNOSIS — R600 Localized edema: Secondary | ICD-10-CM | POA: Diagnosis not present

## 2019-10-07 DIAGNOSIS — M25511 Pain in right shoulder: Secondary | ICD-10-CM

## 2019-10-07 DIAGNOSIS — M6281 Muscle weakness (generalized): Secondary | ICD-10-CM

## 2019-10-07 NOTE — Therapy (Signed)
[facility] Physical Therapy [address] [place], [place], [address] Phone: [phone]   Fax:  [phone]  Physical Therapy Treatment  Patient Details  Name: Megan Reid MRN: [id number] Date of Birth: [birth year] Referring Provider (PT): Marlou Sa, MD   Encounter Date: 10/07/2019   PT End of Session - 10/07/19 1027    Visit Number 15    Number of Visits 23    Date for PT Re-Evaluation 11/04/19    Authorization Type BCBS    Authorization Time Period 09/23/2019 - 11/04/2019 (new POC order)    PT Start Time 0938    PT Stop Time 1030    PT Time Calculation (min) 52 min    Activity Tolerance Patient tolerated treatment well    Behavior During Therapy [facility] for tasks assessed/performed           Past Medical History:  Diagnosis Date  . Arthralgia 02/14/2016  . Asthma   . Autoimmune disease ([place]) 02/14/2016   Positive RF 139 CCP Negative  Positive ANA 1:640 Positive Ro Positive La Elevated ESR 105 - Sjorgen's Disease  . DDD (degenerative disc disease), lumbar 02/14/2016  . Fatigue 02/14/2016  . Gastritis 02/14/2016  . Hot flashes   . Migraines 02/14/2016  . Osteoarthritis of both hands 02/14/2016  . Osteoarthritis of both knees 02/14/2016  . Vitamin D deficiency 02/14/2016    Past Surgical History:  Procedure Laterality Date  . BREAST BIOPSY Left   . TUBAL LIGATION      There were no vitals filed for this visit.   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    Pain Onset More than a month ago              [facility] Adult PT Treatment/Exercise - 10/07/19 0001      Shoulder Exercises: Sidelying   External Rotation Right    External Rotation Weight (lbs) 2    External Rotation Limitations 3X15     ABduction AAROM;Right    ABduction Weight (lbs) 2    ABduction Limitations 3X10      Shoulder Exercises: Standing   Flexion AROM;Both    Flexion Limitations 3X10, shrug sign    ABduction Both    ABduction Limitations 2X10, then 2X5 shrug sign    Other Standing Exercises reaching up into cabinent with 1 lb wt middle shelf X 10, then top shelf no weight X10,  then screw on/off BAPS L 3 then L 2 one time each      Shoulder Exercises: Pulleys   Flexion 2 minutes    Scaption 2 minutes      Shoulder Exercises: ROM/Strengthening   UBE (Upper Arm Bike) lvl 4 2 mins each fwd/rev    [place] X15 flexion, then cirlces in flexion 1 lb wt on Rt wrist      Shoulder Exercises: Stretch   Corner Stretch Limitations doorway stretch for Rt shoulder ER 10 sec X 10 reps    Internal Rotation Stretch Limitations 10 sec X 10 with strap behind back    Other Shoulder Stretches seated inferior GH mobs self MWM with strap      Vasopneumatic   Number Minutes Vasopneumatic  10 minutes    Vasopnuematic Location  Shoulder  Vasopneumatic Pressure Medium    Vasopneumatic Temperature  34      Manual Therapy   Manual therapy comments Rt shoulder PROM all planes and GH mobs inferior, distraciton, A-P, P-A grade 3-4                    PT Short Term Goals - 08/28/19 1549      PT SHORT TERM GOAL #1   Title Pt will be I and compliant with initial HEP.    Time 4    Period Weeks    Status Achieved    Target Date 08/18/19      PT SHORT TERM GOAL #2   Title Pt will improve shoulder PROM to [facility].    Baseline <75%% available    Status Achieved             PT Long Term Goals - 09/23/19 1133      PT LONG TERM GOAL #1   Title Pt will be I and compliant with advanced HEP. (target for all LTG 8 weeks 09/15/19    Time 6    Period Weeks    Status On-going    Target Date 11/04/19      PT LONG TERM GOAL #2   Title Pt will improve Rt shoulder AROM to [facility].    Time 6    Period Weeks    Status  On-going    Target Date 11/04/19      PT LONG TERM GOAL #3   Title Pt will improve Rt shoulder strength to 4+/5 to improve function.    Period Weeks    Status On-going    Target Date 11/04/19      PT LONG TERM GOAL #4   Title Pt will reduce overall pain with usual activity and with reaching/writting to no more than 3/10 on avg    Time 6    Period Weeks    Status On-going    Target Date 11/04/19                 Plan - 10/07/19 1121    Clinical Impression Statement Noticed improvements in shoulder strength but still with weakness and shrug sign compensation. PT will continue to address this as able    Personal Factors and Comorbidities Comorbidity 2    Comorbidities PMH: S/P RTC repair 06/16/19, autoimmune disease, lumbar DDD,    Examination-Activity Limitations Carry;Sleep;Lift;Reach Overhead    Examination-Participation Restrictions Cleaning;Driving;Meal Prep;Laundry    Stability/Clinical Decision Making Evolving/Moderate complexity    Rehab Potential Good    PT Frequency 2x / week   2-3   PT Duration 6 weeks    PT Treatment/Interventions Electrical Stimulation;ADLs/Self Care Home Management;Iontophoresis 19m/ml Dexamethasone;Moist Heat;Ultrasound;Therapeutic activities;Therapeutic exercise;Neuromuscular re-education;Patient/family education;Manual techniques;Passive range of motion;Dry needling;Joint Manipulations;Taping    PT Next Visit Plan Myofascial release as needed, progress strengthening, arom    PT Home Exercise Plan Access Code: [email or web address]   Consulted and Agree with Plan of Care Patient           Patient will benefit from skilled therapeutic intervention in order to improve the following deficits and impairments:  Decreased activity tolerance, Decreased endurance, Decreased range of motion, Decreased strength, Increased fascial restricitons, Increased muscle spasms, Impaired flexibility, Pain  Visit Diagnosis: Acute pain of right shoulder  Stiffness of right  shoulder, not elsewhere classified  Muscle weakness (generalized)  Localized edema     Problem List Patient Active Problem List   Diagnosis Date Noted  .  Abrasion of right ankle 02/13/2017  . Rheumatoid factor positive 09/01/2016  . Leucopenia 09/01/2016  . Autoimmune disease ([place]) 02/14/2016  . Fatigue 02/14/2016  . DDD (degenerative disc disease), lumbar 02/14/2016  . Osteoarthritis of both knees 02/14/2016  . Osteoarthritis of both hands 02/14/2016  . Asthma 02/14/2016  . Migraines 02/14/2016  . Gastritis 02/14/2016  . Vitamin D deficiency 02/14/2016  . 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[place]) 02/14/2016  . High risk medication use 02/14/2016    Silvestre Mesi 10/07/2019, [facility] [address] [place], [place], [address] Phone: [phone]   Fax:  [phone]  Name: Shardae Kleinman MRN: [id number] Date of Birth: 12/03/1965

## 2019-10-09 ENCOUNTER — Encounter: Payer: BC Managed Care – PPO | Admitting: Physical Therapy

## 2019-10-22 ENCOUNTER — Other Ambulatory Visit: Payer: Self-pay

## 2019-10-22 ENCOUNTER — Encounter: Payer: Self-pay | Admitting: Physical Therapy

## 2019-10-22 ENCOUNTER — Ambulatory Visit: Payer: BC Managed Care – PPO | Admitting: Physical Therapy

## 2019-10-22 DIAGNOSIS — R600 Localized edema: Secondary | ICD-10-CM | POA: Diagnosis not present

## 2019-10-22 DIAGNOSIS — M25511 Pain in right shoulder: Secondary | ICD-10-CM

## 2019-10-22 DIAGNOSIS — M25611 Stiffness of right shoulder, not elsewhere classified: Secondary | ICD-10-CM

## 2019-10-22 DIAGNOSIS — M6281 Muscle weakness (generalized): Secondary | ICD-10-CM

## 2019-10-22 NOTE — Therapy (Addendum)
[facility] [address] [place], [place], [address] Phone: [phone]   Fax:  [phone]  Physical Therapy Treatment/MD progress note/Discharge addendum PHYSICAL THERAPY DISCHARGE SUMMARY  Visits from Start of Care: 16  Current functional level related to goals / functional outcomes: See below   Remaining deficits: See below   Education / Equipment: HEP Plan: Patient agrees to discharge.  Patient goals were partially met. Patient is being discharged due to not returning since the last visit.  ?????  Elsie Ra, PT, DPT 02/02/20 9:02 AM      Patient Details  Name: Megan Reid MRN: [id number] Date of Birth: [birth year] Referring Provider (PT): Marlou Sa, MD   Encounter Date: 10/22/2019   PT End of Session - 10/22/19 0957    Visit Number 16    Number of Visits 23    Date for PT Re-Evaluation 11/04/19    Authorization Type BCBS    Authorization Time Period 09/23/2019 - 11/04/2019 (new POC order)    PT Start Time 0937    PT Stop Time 1025    PT Time Calculation (min) 48 min    Activity Tolerance Patient tolerated treatment well    Behavior During Therapy [facility] for tasks assessed/performed           Past Medical History:  Diagnosis Date  . Arthralgia 02/14/2016  . Asthma   . Autoimmune disease ([place]) 02/14/2016   Positive RF 139 CCP Negative  Positive ANA 1:640 Positive Ro Positive La Elevated ESR 105 - Sjorgen's Disease  . DDD (degenerative disc disease), lumbar 02/14/2016  . Fatigue 02/14/2016  . Gastritis 02/14/2016  . Hot flashes   . Migraines 02/14/2016  . Osteoarthritis of both hands 02/14/2016  . Osteoarthritis of both knees 02/14/2016  . Vitamin D deficiency 02/14/2016    Past Surgical History:  Procedure Laterality Date  . BREAST BIOPSY Left   . TUBAL LIGATION      There were no vitals filed for this visit.   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    Pain Onset More than a month ago              [facility] PT Assessment - 10/22/19 0001      Assessment   Medical Diagnosis  s/p right shoulder rotator cuff tear repair 06/16/19     Referring Provider (PT) Marlou Sa, MD    Onset Date/Surgical Date 06/16/19    Hand Dominance Right      AROM   Right Shoulder Flexion 140 Degrees    Right Shoulder ABduction 130 Degrees      PROM   Right Shoulder Flexion 165 Degrees    Right Shoulder ABduction 160 Degrees   slight scaption   Right Shoulder Internal Rotation 65 Degrees    Right Shoulder External Rotation 85 Degrees      Strength   Right Shoulder Flexion 4/5    Right Shoulder ABduction 3+/5    Right Shoulder Internal Rotation 4+/5    Right Shoulder External Rotation 4-/5                         [facility] Adult PT Treatment/Exercise - 10/22/19 0001  Shoulder Exercises: Standing   External Rotation Right    Theraband Level (Shoulder External Rotation) Level 2 (Red)    External Rotation Limitations 3X10 reps with red    Internal Rotation Right    Theraband Level (Shoulder Internal Rotation) Level 2 (Red)    Internal Rotation Limitations 3X10 reps    ABduction Limitations 3X10 shrug sign    Extension Both    Theraband Level (Shoulder Extension) Level 3 (Green)    Extension Limitations 2X15    Row Strengthening;Both;20 reps    Theraband Level (Shoulder Row) Level 3 (Green)    Row Limitations 2X15    Other Standing Exercises reaching up into cabinent with 2 lb wt X 10 reps then 2 sets of 5 reps    Other Standing Exercises standing OH ball stabilization rolls 2 lb ball X 10 each direction on Rt      Shoulder Exercises: Pulleys   Flexion 2 minutes    Scaption 2 minutes      Shoulder Exercises: ROM/Strengthening   UBE  (Upper Arm Bike) lvl 4 2 mins each fwd/rev    [place]anger X15 flexion, then cirlces in flexion 2 lb wt on Rt wrist    Wall Wash wall ladde X 5 flexion and X 5 scaption      Vasopneumatic   Number Minutes Vasopneumatic  10 minutes    Vasopnuematic Location  Shoulder    Vasopneumatic Pressure Medium    Vasopneumatic Temperature  34      Manual Therapy   Manual therapy comments Rt shoulder PROM all planes and GH mobs inferior, distraciton, A-P, P-A grade 3-4                    PT Short Term Goals - 08/28/19 1549      PT SHORT TERM GOAL #1   Title Pt will be I and compliant with initial HEP.    Time 4    Period Weeks    Status Achieved    Target Date 08/18/19      PT SHORT TERM GOAL #2   Title Pt will improve shoulder PROM to [facility].    Baseline <75%% available    Status Achieved             PT Long Term Goals - 10/22/19 1018      PT LONG TERM GOAL #1   Title Pt will be I and compliant with advanced HEP. (target for all LTG 8 weeks 09/15/19    Time 6    Period Weeks    Status Achieved      PT LONG TERM GOAL #2   Title Pt will improve Rt shoulder AROM to [facility].    Time 6    Period Weeks    Status On-going      PT LONG TERM GOAL #3   Title Pt will improve Rt shoulder strength to 4+/5 to improve function.    Period Weeks    Status On-going      PT LONG TERM GOAL #4   Title Pt will reduce overall pain with usual activity and with reaching/writting to no more than 3/10 on avg    Time 6    Period Weeks    Status Achieved                 Plan - 10/22/19 1016    Clinical Impression Statement She has made progress in strength and ROM, overall PT is pleased with her progress but still  has significant weakness in flexion, abduciton, and ER. PT recommending to continue with PT and will continue to progress her strength as tolerated with PT and she shows great effort with PT.    Personal Factors and Comorbidities Comorbidity 2    Comorbidities PMH: S/P RTC repair  06/16/19, autoimmune disease, lumbar DDD,    Examination-Activity Limitations Carry;Sleep;Lift;Reach Overhead    Examination-Participation Restrictions Cleaning;Driving;Meal Prep;Laundry    Stability/Clinical Decision Making Evolving/Moderate complexity    Rehab Potential Good    PT Frequency 2x / week   2-3   PT Duration 6 weeks    PT Treatment/Interventions Electrical Stimulation;ADLs/Self Care Home Management;Iontophoresis 17m/ml Dexamethasone;Moist Heat;Ultrasound;Therapeutic activities;Therapeutic exercise;Neuromuscular re-education;Patient/family education;Manual techniques;Passive range of motion;Dry needling;Joint Manipulations;Taping    PT Next Visit Plan Myofascial release as needed, progress strengthening, arom    PT Home Exercise Plan Access Code: [email or web address]   Consulted and Agree with Plan of Care Patient           Patient will benefit from skilled therapeutic intervention in order to improve the following deficits and impairments:  Decreased activity tolerance, Decreased endurance, Decreased range of motion, Decreased strength, Increased fascial restricitons, Increased muscle spasms, Impaired flexibility, Pain  Visit Diagnosis: Acute pain of right shoulder  Stiffness of right shoulder, not elsewhere classified  Muscle weakness (generalized)  Localized edema     Problem List Patient Active Problem List   Diagnosis Date Noted  . Abrasion of right ankle 02/13/2017  . Rheumatoid factor positive 09/01/2016  . Leucopenia 09/01/2016  . Autoimmune disease ([place] 02/14/2016  . Fatigue 02/14/2016  . DDD (degenerative disc disease), lumbar 02/14/2016  . Osteoarthritis of both knees 02/14/2016  . Osteoarthritis of both hands 02/14/2016  . Asthma 02/14/2016  . Migraines 02/14/2016  . Gastritis 02/14/2016  . Vitamin D deficiency 02/14/2016  . 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 02/14/2016  . High risk medication use 02/14/2016    BSilvestre Mesi7/10/2019, 10:18 AM  [facility] [address] [place] [address]Phone: [phone]  Fax:  [phone] Name: Megan BarrerasMRN: [id number] of Birth: 502/21/1967

## 2019-10-24 ENCOUNTER — Ambulatory Visit (INDEPENDENT_AMBULATORY_CARE_PROVIDER_SITE_OTHER): Payer: BC Managed Care – PPO | Admitting: Orthopedic Surgery

## 2019-10-24 DIAGNOSIS — M75121 Complete rotator cuff tear or rupture of right shoulder, not specified as traumatic: Secondary | ICD-10-CM

## 2019-10-25 ENCOUNTER — Encounter: Payer: Self-pay | Admitting: Orthopedic Surgery

## 2019-10-25 NOTE — Progress Notes (Signed)
Office Visit Note   Patient: Megan Reid           Date of Birth: 1965-12-03           MRN: [id number] Visit Date: 10/24/2019 Requested by: No referring provider defined for this encounter. PCP: Patient, No Pcp Per  Subjective: Chief Complaint  Patient presents with  . Right Shoulder - Follow-up    HPI: Megan Reid is a 54 y.o. female who presents to the office s/p right rotator cuff repair on 06/16/2019.  He notes that she is doing well overall.  She complains of occasional anterior pain and occasionally wakes with pain but overall pain is continuing to improve.  She does not take any medication for pain.  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  She notes steady improvement but she does state that it is difficult to lift desks above her head at work.  This is definitely not something we have encouraged her to do.  She is able to write on the board in the classroom..                ROS:  All systems reviewed are negative as they relate to the chief complaint within the history of present illness.  Patient denies fevers or chills.  Assessment & Plan: Visit Diagnoses:  1. Complete tear of right rotator cuff, unspecified whether traumatic     Plan: Patient is a 54 year old female presents about 4 months out from right rotator cuff repair.  Patient is doing well overall and her range of motion continues to improve.  She is still in physical therapy 1 time a week.  She did not take any medication for pain any longer.  She is back to work as a Patent examiner.  Her only main complaint is that she has difficulty taking the 40 pound desks off of the tables where they are stored.  Highly recommended that she avoid doing this.  Work note was provided stating that she should not do any overhead lifting greater than 10 pounds.  She has good strength but she notes that she fatigues when she goes overhead.  She is able to write on the board.  Plan for patient to continue  physical therapy 1 time a week for 6 weeks.  Follow-up in 2 months.  We will need to do careful reassessment of her shoulder crepitus with passive range of motion as well as her strength.  Overall she is improved.  She did have a large tear.  Functionally she is doing better but we do need to protect essentially a chronic longstanding rotator cuff tear that has been repaired.  We will keep her for 12 months out of any overhead lifting.  Follow-Up Instructions: No follow-ups on file.   Orders:  Orders Placed This Encounter  Procedures  . Ambulatory referral to Physical Therapy   No orders of the defined types were placed in this encounter.     Procedures: No procedures performed   Clinical Data: No additional findings.  Objective: Vital Signs: LMP 05/22/2012   Physical Exam:  Constitutional: Patient appears well-developed HEENT:  Head: Normocephalic Eyes:EOM are normal Neck: Normal range of motion Cardiovascular: Normal rate Pulmonary/chest: Effort normal Neurologic: Patient is alert Skin: Skin is warm Psychiatric: Patient has normal mood and affect  Ortho Exam:  Shoulder exam demonstrates 35 degrees external rotation, 90 degrees abduction, 120 degrees forward flexion.  Incisions have healed well with no evidence of  infection.  She has good strength of the supraspinatus, infraspinatus, subscapularis muscles.  Active forward flexion, abduction to 90 degrees.  No crepitus is palpable with passive range of motion the shoulder.  Specialty Comments:  No specialty comments available.  Imaging: No results found.   PMFS History: Patient Active Problem List   Diagnosis Date Noted  . Abrasion of right ankle 02/13/2017  . Rheumatoid factor positive 09/01/2016  . Leucopenia 09/01/2016  . Autoimmune disease ([place]) 02/14/2016  . Fatigue 02/14/2016  . DDD (degenerative disc disease), lumbar 02/14/2016  . Osteoarthritis of both knees 02/14/2016  . Osteoarthritis of both hands  02/14/2016  . Asthma 02/14/2016  . Migraines 02/14/2016  . Gastritis 02/14/2016  . Vitamin D deficiency 02/14/2016  . 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[place]) 02/14/2016  . High risk medication use 02/14/2016   Past Medical History:  Diagnosis Date  . Arthralgia 02/14/2016  . Asthma   . Autoimmune disease ([place]) 02/14/2016   Positive RF 139 CCP Negative  Positive ANA 1:640 Positive Ro Positive La Elevated ESR 105 - Sjorgen's Disease  . DDD (degenerative disc disease), lumbar 02/14/2016  . Fatigue 02/14/2016  . Gastritis 02/14/2016  . Hot flashes   . Migraines 02/14/2016  . Osteoarthritis of both hands 02/14/2016  . Osteoarthritis of both knees 02/14/2016  . Vitamin D deficiency 02/14/2016    Family History  Problem Relation Age of Onset  . Kidney failure Mother   . Heart failure Mother   . Lupus Mother   . COPD Father   . Hypertension Sister   . Acromegaly Sister   . Breast cancer Paternal Aunt 54    Past Surgical History:  Procedure Laterality Date  . BREAST BIOPSY Left   . TUBAL LIGATION     Social History   Occupational History  . Occupation: Pharmacist, hospital  Tobacco Use  . Smoking status: Former Smoker    Packs/day: 0.50    Years: 2.00    Pack years: 1.00    Types: Cigarettes    Quit date: 05/16/1986    Years since quitting: 33.4  . Smokeless tobacco: Never Used  . Tobacco comment: TEEN AG[employer]  Vaping Use  . Vaping Use: Never used  Substance and Sexual Activity  . Alcohol use: No  . Drug use: No  . Sexual activity: Not on file

## 2019-11-14 ENCOUNTER — Ambulatory Visit: Payer: BC Managed Care – PPO | Admitting: Pulmonary Disease

## 2019-11-14 ENCOUNTER — Encounter: Payer: BC Managed Care – PPO | Admitting: Physical Therapy

## 2019-12-04 ENCOUNTER — Encounter (HOSPITAL_COMMUNITY): Payer: Self-pay

## 2019-12-04 ENCOUNTER — Other Ambulatory Visit: Payer: Self-pay

## 2019-12-04 ENCOUNTER — Emergency Department (HOSPITAL_COMMUNITY): Payer: BC Managed Care – PPO

## 2019-12-04 DIAGNOSIS — J45909 Unspecified asthma, uncomplicated: Secondary | ICD-10-CM | POA: Diagnosis not present

## 2019-12-04 DIAGNOSIS — J90 Pleural effusion, not elsewhere classified: Secondary | ICD-10-CM | POA: Insufficient documentation

## 2019-12-04 DIAGNOSIS — R0602 Shortness of breath: Secondary | ICD-10-CM | POA: Diagnosis not present

## 2019-12-04 DIAGNOSIS — Z87891 Personal history of nicotine dependence: Secondary | ICD-10-CM | POA: Diagnosis not present

## 2019-12-04 DIAGNOSIS — E041 Nontoxic single thyroid nodule: Secondary | ICD-10-CM | POA: Insufficient documentation

## 2019-12-04 DIAGNOSIS — Z20822 Contact with and (suspected) exposure to covid-19: Secondary | ICD-10-CM | POA: Diagnosis not present

## 2019-12-04 DIAGNOSIS — R0789 Other chest pain: Secondary | ICD-10-CM | POA: Diagnosis present

## 2019-12-04 NOTE — ED Triage Notes (Signed)
[facility] sent from [facility] for a chest CT. They found a L pleural effusion and recommended followup here. No distress noted. Pt has a hx of Sjogren's syndrome. No fevers, Fully vaccinated against COVID.

## 2019-12-05 ENCOUNTER — Emergency Department (HOSPITAL_COMMUNITY): Payer: BC Managed Care – PPO

## 2019-12-05 ENCOUNTER — Encounter (HOSPITAL_COMMUNITY): Payer: Self-pay

## 2019-12-05 ENCOUNTER — Emergency Department (HOSPITAL_COMMUNITY)
Admission: EM | Admit: 2019-12-05 | Discharge: 2019-12-05 | Disposition: A | Discharge status: Home / Self Care | Payer: BC Managed Care – PPO | Attending: Emergency Medicine | Admitting: Emergency Medicine

## 2019-12-05 DIAGNOSIS — E041 Nontoxic single thyroid nodule: Secondary | ICD-10-CM

## 2019-12-05 DIAGNOSIS — J90 Pleural effusion, not elsewhere classified: Secondary | ICD-10-CM

## 2019-12-05 LAB — URINALYSIS, ROUTINE W REFLEX MICROSCOPIC
Bacteria, UA: NONE SEEN
Bilirubin Urine: NEGATIVE
Glucose, UA: NEGATIVE mg/dL
Ketones, ur: NEGATIVE mg/dL
Leukocytes,Ua: NEGATIVE
Nitrite: NEGATIVE
Protein, ur: NEGATIVE mg/dL
Specific Gravity, Urine: 1.013 (ref 1.005–1.030)
pH: 5 (ref 5.0–8.0)

## 2019-12-05 LAB — CBC WITH DIFFERENTIAL/PLATELET
Abs Immature Granulocytes: 0.01 10*3/uL (ref 0.00–0.07)
Basophils Absolute: 0 10*3/uL (ref 0.0–0.1)
Basophils Relative: 1 %
Eosinophils Absolute: 0 10*3/uL (ref 0.0–0.5)
Eosinophils Relative: 0 %
HCT: 35.6 % — ABNORMAL LOW (ref 36.0–46.0)
Hemoglobin: 11.1 g/dL — ABNORMAL LOW (ref 12.0–15.0)
Immature Granulocytes: 0 %
Lymphocytes Relative: 33 %
Lymphs Abs: 1 10*3/uL (ref 0.7–4.0)
MCH: 27.5 pg (ref 26.0–34.0)
MCHC: 31.2 g/dL (ref 30.0–36.0)
MCV: 88.3 fL (ref 80.0–100.0)
Monocytes Absolute: 0.4 10*3/uL (ref 0.1–1.0)
Monocytes Relative: 11 %
Neutro Abs: 1.7 10*3/uL (ref 1.7–7.7)
Neutrophils Relative %: 55 %
Platelets: 253 10*3/uL (ref 150–400)
RBC: 4.03 MIL/uL (ref 3.87–5.11)
RDW: 14.6 % (ref 11.5–15.5)
WBC: 3.1 10*3/uL — ABNORMAL LOW (ref 4.0–10.5)
nRBC: 0 % (ref 0.0–0.2)

## 2019-12-05 LAB — COMPREHENSIVE METABOLIC PANEL
ALT: 17 U/L (ref 0–44)
AST: 36 U/L (ref 15–41)
Albumin: 2.8 g/dL — ABNORMAL LOW (ref 3.5–5.0)
Alkaline Phosphatase: 47 U/L (ref 38–126)
Anion gap: 6 (ref 5–15)
BUN: 16 mg/dL (ref 6–20)
CO2: 27 mmol/L (ref 22–32)
Calcium: 9.4 mg/dL (ref 8.9–10.3)
Chloride: 104 mmol/L (ref 98–111)
Creatinine, Ser: 0.88 mg/dL (ref 0.44–1.00)
GFR calc Af Amer: >60 mL/min (ref >60)
GFR calc non Af Amer: >60 mL/min (ref >60)
Glucose, Bld: 96 mg/dL (ref 70–99)
Potassium: 3.9 mmol/L (ref 3.5–5.1)
Sodium: 137 mmol/L (ref 135–145)
Total Bilirubin: 0.3 mg/dL (ref 0.3–1.2)
Total Protein: 10.1 g/dL — ABNORMAL HIGH (ref 6.5–8.1)

## 2019-12-05 LAB — TROPONIN I (HIGH SENSITIVITY)
Troponin I (High Sensitivity): 3 ng/L (ref <18)
Troponin I (High Sensitivity): 3 ng/L (ref <18)

## 2019-12-05 LAB — BRAIN NATRIURETIC PEPTIDE: B Natriuretic Peptide: 24.6 pg/mL (ref 0.0–100.0)

## 2019-12-05 LAB — SARS CORONAVIRUS 2 BY RT PCR (HOSPITAL ORDER, PERFORMED IN ~~LOC~~ HOSPITAL LAB): SARS Coronavirus 2: NEGATIVE

## 2019-12-05 MED ORDER — SODIUM CHLORIDE (PF) 0.9 % IJ SOLN
INTRAMUSCULAR | Status: AC
  Filled 2019-12-05: qty 50

## 2019-12-05 MED ORDER — IPRATROPIUM-ALBUTEROL 0.5-2.5 (3) MG/3ML IN SOLN
3.0000 mL | Freq: Once | RESPIRATORY_TRACT | Status: AC
  Administered 2019-12-05: 3 mL via RESPIRATORY_TRACT
  Filled 2019-12-05: qty 3

## 2019-12-05 MED ORDER — IOHEXOL 350 MG/ML SOLN
100.0000 mL | Freq: Once | INTRAVENOUS | Status: AC | PRN
  Administered 2019-12-05: 100 mL via INTRAVENOUS

## 2019-12-05 MED ORDER — PREDNISONE 20 MG PO TABS: ORAL_TABLET | ORAL | 0 refills | Status: DC

## 2019-12-05 MED ORDER — PREDNISONE 20 MG PO TABS
60.0000 mg | ORAL_TABLET | Freq: Once | ORAL | Status: AC
  Administered 2019-12-05: 60 mg via ORAL
  Filled 2019-12-05: qty 3

## 2019-12-05 NOTE — ED Notes (Signed)
Pt ambulated to the restroom gait steady with no assistance 

## 2019-12-05 NOTE — ED Notes (Signed)
Patient transported to CT 

## 2019-12-05 NOTE — ED Notes (Signed)
ED Provider at bedside. 

## 2019-12-05 NOTE — ED Notes (Signed)
Patient ambulatory to the stretcher with c/o sob and chest pain, sent here from [facility] for further evaluation.

## 2019-12-05 NOTE — ED Notes (Signed)
Pt brother is on the phone. Patient states that she would like him to speak with our staff because he is a PA in [place] and is irate. Staff agrees to speak with brother if he can be calm. Explained to brother that we have completed standing orders and the doctor ordered a repeat xray and is aware of the patient. Pt is ambulatory and in no obvious respiratory distress. Patient pushed open the door and quickly ambulated back out to the lobby under her own power stating "They are playing."

## 2019-12-05 NOTE — ED Provider Notes (Signed)
Emergency Department Provider Note  I have reviewed the triage vital signs and the nursing notes.  HISTORY  Chief Complaint Pleural Effusion   HPI Megan Reid is a 54 y.o. female with multiple medical problems documented below to include asthma, Sjogren's who presents the emergency department today with chest pain shortness of breath.  Patient states that is been going on for about a week or week and a half.  Similar to previous episodes of pneumonia that she has had.  She states that she was post to see her rheumatologist today so she was waiting for that appointment but she was to have been placed over she never got to see I went to a walk-in clinic here in [place] instead.  She states that an x-ray was done there showing a pleural effusion.  She states that the provider there seem to get very very concerned about it and told to come the emergency room for further work-up.  Patient has had a cough but no fevers.  She has bilateral lower extremity swelling which comes and goes sometimes.  She has some tenderness in her left greater than right leg but this is not new when has been told this is associated with her Sjogren's.  She does have a history of asthma and uses her inhaler and she feels like that might be one component of this.  She also describes some malodorous urine but has not had a urine checked in the last week or 2.   No other associated or modifying symptoms.    Past Medical History:  Diagnosis Date  . Arthralgia 02/14/2016  . Asthma   . Autoimmune disease ([place]) 02/14/2016   Positive RF 139 CCP Negative  Positive ANA 1:640 Positive Ro Positive La Elevated ESR 105 - Sjorgen's Disease  . DDD (degenerative disc disease), lumbar 02/14/2016  . Fatigue 02/14/2016  . Gastritis 02/14/2016  . Hot flashes   . Migraines 02/14/2016  . Osteoarthritis of both hands 02/14/2016  . Osteoarthritis of both knees 02/14/2016  . Vitamin D deficiency 02/14/2016    Patient Active  Problem List   Diagnosis Date Noted  . Abrasion of right ankle 02/13/2017  . Rheumatoid factor positive 09/01/2016  . Leucopenia 09/01/2016  . Autoimmune disease ([place]) 02/14/2016  . Fatigue 02/14/2016  . DDD (degenerative disc disease), lumbar 02/14/2016  . Osteoarthritis of both knees 02/14/2016  . Osteoarthritis of both hands 02/14/2016  . Asthma 02/14/2016  . Migraines 02/14/2016  . Gastritis 02/14/2016  . Vitamin D deficiency 02/14/2016  . 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[place]) 02/14/2016  . High risk medication use 02/14/2016    Past Surgical History:  Procedure Laterality Date  . BREAST BIOPSY Left   . TUBAL LIGATION      Current Outpatient Rx  . Order #: [id number] Class: Normal  . Order #: [id number] Class: Normal  . Order #: [id number] Class: Normal  . Order #: [id number] Class: Normal  . Order #: [id number] Class: Normal  . Order #: [id number] Class: Normal  . Order #: [id number] Class: Normal  . Order #: [id number] Class: Normal  . Order #: [id number] Class: Normal  . Order #: [id number] Class: Normal  . Order #: [id number] Class: Print  . Order #: [id number] Class: Normal    Allergies Strawberry extract and Sulfa antibiotics  Family History  Problem Relation Age of Onset  . Kidney failure Mother   . Heart failure Mother   . Lupus Mother   . COPD Father   . Hypertension Sister   . Acromegaly Sister   .  Breast cancer Paternal Aunt 23    Social History Social History   Tobacco Use  . Smoking status: Former Smoker    Packs/day: 0.50    Years: 2.00    Pack years: 1.00    Types: Cigarettes    Quit date: 05/16/1986    Years since quitting: 33.5  . Smokeless tobacco: Never Used  . Tobacco comment: TEEN AGER  Vaping Use  . Vaping Use: Never used  Substance Use Topics  . Alcohol use: No  . Drug use: No    Review of Systems  All other systems negative except as documented in the HPI. All pertinent positives and negatives as reviewed in the  HPI. ____________________________________________  PHYSICAL EXAM:  VITAL SIGNS: ED Triage Vitals  Enc Vitals Group     BP 12/04/19 2118 (!) 164/104     Pulse Rate 12/04/19 2118 86     Resp 12/04/19 2118 18     Temp 12/04/19 2118 98.4 F (36.9 C)     Temp Source 12/04/19 2118 Oral     SpO2 12/04/19 2118 100 %    Constitutional: Alert and oriented. Well appearing and in no acute distress. Eyes: Conjunctivae are normal. PERRL. EOMI. Head: Atraumatic. Nose: No congestion/rhinnorhea. Mouth/Throat: Mucous membranes are moist.  Oropharynx non-erythematous. Neck: No stridor.  No meningeal signs.   Cardiovascular: Normal rate, regular rhythm. Good peripheral circulation. Grossly normal heart sounds.   Respiratory: Normal respiratory effort.  No retractions. Lungs diminished bilaterally without obvious wheezing. Gastrointestinal: Soft and nontender. No distention.  Musculoskeletal: No lower extremity tenderness nor edema. No gross deformities of extremities. Neurologic:  Normal speech and language. No gross focal neurologic deficits are appreciated.  Skin:  Skin is warm, dry and intact. No rash noted.  ____________________________________________   LABS (all labs ordered are listed, but only abnormal results are displayed)  Labs Reviewed  CBC WITH DIFFERENTIAL/PLATELET - Abnormal; Notable for the following components:      Result Value   WBC 3.1 (*)    Hemoglobin 11.1 (*)    HCT 35.6 (*)    All other components within normal limits  URINALYSIS, ROUTINE W REFLEX MICROSCOPIC - Abnormal; Notable for the following components:   Color, Urine STRAW (*)    Hgb urine dipstick SMALL (*)    All other components within normal limits  COMPREHENSIVE METABOLIC PANEL - Abnormal; Notable for the following components:   Total Protein 10.1 (*)    Albumin 2.8 (*)    All other components within normal limits  SARS CORONAVIRUS 2 BY RT PCR (HOSPITAL ORDER, [place] [facility])   BRAIN NATRIURETIC PEPTIDE  TROPONIN I (HIGH SENSITIVITY)  TROPONIN I (HIGH SENSITIVITY)   ____________________________________________  EKG   EKG Interpretation  Date/Time:  Friday December 05 2019 02:30:16 EDT Ventricular Rate:  68 PR Interval:    QRS Duration: 97 QT Interval:  417 QTC Calculation: 444 R Axis:   36 Text Interpretation: Sinus rhythm Anteroseptal infarct, old No significant change since last tracing Confirmed by Merrily Pew [id number]) on 12/05/2019 3:10:06 AM      ____________________________________________  RADIOLOGY  CT Angio Chest PE W and/or Wo Contrast  Addendum Date: 12/05/2019   ADDENDUM REPORT: 12/05/2019 04:56 ADDENDUM: Additional impression point: 1.5 cm nodule in the posterior left thyroid lobe. Recommend further evaluation with outpatient thyroid ultrasound if not performed previously. This follows consensus guidelines: Managing Incidental Thyroid Nodules Detected on Imaging: White Paper of the ACR Incidental Thyroid Findings Committee. J Am Coll  Radiol 2015; 12:143-150. and [facility] 3-tiered system for managing ITNs: J Am Coll Radiol. 2015; Feb;12(2): 143-50 Electronically Signed   By: Lovena Le M.D.   On: 12/05/2019 04:56   Result Date: 12/05/2019 CLINICAL DATA:  PE suspected, left pleural effusion, history of Sjogren's disease. EXAM: CT ANGIOGRAPHY CHEST WITH CONTRAST TECHNIQUE: Multidetector CT imaging of the chest was performed using the standard protocol during bolus administration of intravenous contrast. Multiplanar CT image reconstructions and MIPs were obtained to evaluate the vascular anatomy. CONTRAST:  145m OMNIPAQUE IOHEXOL 350 MG/ML SOLN COMPARISON:  CT 02/11/2019 FINDINGS: Cardiovascular: Satisfactory opacification the pulmonary arteries to the segmental level. No pulmonary artery filling defects are identified. Central pulmonary arteries are normal caliber. Normal heart size. No pericardial effusion. Few coronary artery calcifications.  Atherosclerotic plaque within the normal caliber aorta. No acute aortic abnormality. No periaortic stranding or hemorrhage. Minimal plaque in the normally branching proximal great vessels. Mediastinum/Nodes: No mediastinal fluid or gas. 1.5 cm hypoattenuating nodule in the posterior left thyroid gland. No concerning right thyroid nodules. Thoracic inlet is otherwise unremarkable. No acute abnormality of the trachea or esophagus. No worrisome mediastinal, or hilar adenopathy. Clustered prominent bilateral axillary nodes are similar to comparisons. Lungs/Pleura: Evaluation of the lung parenchyma is significantly limited by respiratory motion artifact. There are areas of grossly stable reticular change in the medial segment of the right middle lobe and medial basal segment right lower lobe corresponding well to areas of suspected fibrosis on the comparison CT. A small left pleural effusion is present with some adjacent passive and subsegmental atelectasis in the lower lung with more mild diffuse dependent atelectasis bilaterally. No focal consolidative airspace opacity. Diffuse mild airways thickening is noted. Mild redistribution of the pulmonary vascularity. Previously seen 4 mm nodule in the right lower lobe is no longer visualized on this examination. There is a stable 3 mm sub fissural nodule within the lingula (6/61). No concerning pulmonary nodules or masses are seen. Upper Abdomen: No acute abnormalities present in the visualized portions of the upper abdomen. Musculoskeletal: No chest wall mass or suspicious bone lesions identified. Exaggerated thoracic kyphosis with mild multilevel discogenic changes in the spine. Additional mild degenerative changes in both shoulders. Review of the MIP images confirms the above findings. IMPRESSION: 1. No evidence of acute pulmonary artery embolism. 2. Small left pleural effusion with some adjacent passive and subsegmental atelectasis in the lower lung with more mild diffuse  dependent atelectasis bilaterally. 3. Redistribution of the pulmonary vascularity and some mild airways thickening could reflect early changes of interstitial edema. 4. Stable areas of reticular change in the medial segment of the right middle lobe and medial basal segment right lower lobe corresponding well to areas of suspected fibrosis on the comparison CT. 5. Previously seen 4 mm nodule in the right lower lobe is no longer visualized. Stable 3 mm sub fissural nodule in the lingula, possibly benign fissural lymph node. 6. Aortic Atherosclerosis (ICD10-I70.0). Electronically Signed: By: PLovena LeM.D. On: 12/05/2019 04:39   ____________________________________________  PROCEDURES  Procedure(s) performed:   Procedures ____________________________________________  INITIAL IMPRESSION / ASSESSMENT AND PLAN / ED COURSE   This patient presents to the ED for concern of sob/chest pain, this involves an extensive number of treatment options, and is a complaint that carries with it a high risk of complications and morbidity.  The differential diagnosis includes acs, PE, pneumonia, neoplasm, covid, asthma exacerbation     Lab Tests:  I Ordered, reviewed, and interpreted labs, which included delta troponin,  CMP, UA,  COVID, CBC, BNP.  These were all unremarkable making heart failure, ACS, covid pneumonia unlikely.   Medicines ordered:   I ordered medication DuoNeb and steroids so she does have asthma to see if there is help with the breathing and this actually did help very significantly.    Imaging Studies ordered:   I independently visualized and interpreted imaging chest x-ray which showed left basilar opacity but her CT scan showed no pulmonary embolus no consolidation or evidence of infection, minimal pleural effusion on side no evidence of neoplasm or other masses.  Did show a thyroid nodule which she will follow-up with her PCP for.    Additional history obtained:   Additional  history obtained from no one  Previous records obtained and reviewed in epic  Consultations Obtained:   I consulted no one and discussed lab and imaging findings  Reevaluation:  Of note patient was also noted to be sleeping multiple times in the emergency room with normal respiratory rate, oxygen and the rest of her vital signs within normal limits.  She also does not normally have high blood pressure.  I am wondering if there is an aspect of anxiety associated with this as she was told at the urgent care that there could be something severe.    After the interventions stated above, I reevaluated the patient and found significant improvement.  Will continue on steroids at home with PCP follow-up for the thyroid nodule and for her breathing.  A medical screening exam was performed and I feel the patient has had an appropriate workup for their chief complaint at this time and likelihood of emergent condition existing is low. They have been counseled on decision, discharge, follow up and which symptoms necessitate immediate return to the emergency department. They or their family verbally stated understanding and agreement with plan and discharged in stable condition.   ____________________________________________  FINAL CLINICAL IMPRESSION(S) / ED DIAGNOSES  Final diagnoses:  Pleural effusion  Thyroid nodule    MEDICATIONS GIVEN DURING THIS VISIT:  Medications  iohexol (OMNIPAQUE) 350 MG/ML injection 100 mL (100 mLs Intravenous Contrast Given 12/05/19 0418)  ipratropium-albuterol (DUONEB) 0.5-2.5 (3) MG/3ML nebulizer solution 3 mL (3 mLs Nebulization Given 12/05/19 0540)  predniSONE (DELTASONE) tablet 60 mg (60 mg Oral Given 12/05/19 0540)    NEW OUTPATIENT MEDICATIONS STARTED DURING THIS VISIT:  Discharge Medication List as of 12/05/2019  6:41 AM    START taking these medications   Details  predniSONE (DELTASONE) 20 MG tablet 3 tabs po day one, then 2 po daily x 4 days, Print         Note:  This note was prepared with assistance of Dragon voice recognition software. Occasional wrong-word or sound-a-like substitutions may have occurred due to the inherent limitations of voice recognition software.   Merrily Pew, MD 12/05/19 [phone]

## 2019-12-05 NOTE — ED Notes (Signed)
Patient ambulatory to the lobby with a steady gait and belongings. 

## 2019-12-26 ENCOUNTER — Other Ambulatory Visit: Payer: Self-pay | Admitting: Oncology

## 2019-12-26 DIAGNOSIS — D709 Neutropenia, unspecified: Secondary | ICD-10-CM

## 2019-12-30 ENCOUNTER — Encounter: Payer: Self-pay | Admitting: Pulmonary Disease

## 2019-12-30 ENCOUNTER — Other Ambulatory Visit: Payer: Self-pay

## 2019-12-30 ENCOUNTER — Ambulatory Visit: Payer: BC Managed Care – PPO | Admitting: Pulmonary Disease

## 2019-12-30 VITALS — BP 128/72 | HR 86 | Temp 97.6°F | Ht 70.0 in | Wt 169.2 lb

## 2019-12-30 DIAGNOSIS — J4541 Moderate persistent asthma with (acute) exacerbation: Secondary | ICD-10-CM

## 2019-12-30 MED ORDER — PREDNISONE 20 MG PO TABS: 20.0000 mg | ORAL_TABLET | Freq: Every day | ORAL | 0 refills | Status: DC

## 2019-12-30 NOTE — Progress Notes (Addendum)
Megan Reid    [id number]    10-07-1965  Primary Care Physician:System, Pcp Not In  Referring Physician: No referring provider defined for this encounter.  Chief complaint: Follow up for asthma  HPI: 54 year old with history of asthma, autoimmune disease with rheumatoid arthritis, Sjogren's syndrome  Previously followed by Dr. Corliss Skains and rheumatology at [facility].  She was on plaquenil, methotrexate and folic acid, prednisone.  She has not followed up with rheumatology since 2018 and is currently not on treatment.  Last rheumatology note was from [facility] in 2018 where they discussed possibly needing leflunomide or CellCept.  But she has not followed back since the co-pay was high at [facility] She had a high-res CT in 2018 with no evidence of interstitial lung disease.  CTA in December 2019 showed some groundglass opacities at the bases but no follow-up CT was done  Has history of asthma for which she is on Symbicort but is using it 2 puffs once a day.  Also has significant allergic rhinitis, GERD which is untreated at present.  Pets: Has a cat.  No birds, farm animal Occupation: Runner, broadcasting/film/video for fourth grade Exposures: Reports mold exposure at school but currently she is working remotely from home.  She also has a comfortable.  No hot tubs, Jacuzzi's, humidifier Smoking history: Remote smoking history as a teenager Travel history: Previously lived in [place], [place].  She has been in [place] for the past 7 years.  No significant recent travel Relevant family history: Father had emphysema.  He was a smoker.  Interim history: Has worsening asthma control with increasing dyspnea, shortness of breath She is using her Symbicort regularly Act score 01/09/2020-10  Outpatient Encounter Medications as of 12/30/2019  Medication Sig   albuterol (PROVENTIL HFA;VENTOLIN HFA) 108 (90 Base) MCG/ACT inhaler Inhale 2 puffs into the lungs 2 (two) times daily. For shortness of breath    aspirin (ASPIRIN CHILDRENS) 81 MG chewable tablet Chew 1 tablet (81 mg total) by mouth daily.   fluticasone (FLONASE) 50 MCG/ACT nasal spray Place 2 sprays into both nostrils daily.   HYDROcodone-acetaminophen (NORCO/VICODIN) 5-325 MG tablet Take 1 tablet by mouth every 6 (six) hours as needed for moderate pain.   ibuprofen (ADVIL) 800 MG tablet TAKE 1 TABLET(800 MG) BY MOUTH EVERY 8 HOURS AS NEEDED   methocarbamol (ROBAXIN) 500 MG tablet Take 1 tablet (500 mg total) by mouth every 8 (eight) hours as needed.   montelukast (SINGULAIR) 10 MG tablet TAKE 1 TABLET(10 MG) BY MOUTH EVERY EVENING   ondansetron (ZOFRAN) 4 MG tablet Take 1 tablet (4 mg total) by mouth every 8 (eight) hours as needed for nausea or vomiting.   oxyCODONE (ROXICODONE) 5 MG immediate release tablet Take 1 tablet (5 mg total) by mouth every 4 (four) hours as needed.   pantoprazole (PROTONIX) 40 MG tablet Take 1 tablet (40 mg total) by mouth 2 (two) times daily.   predniSONE (DELTASONE) 20 MG tablet 3 tabs po day one, then 2 po daily x 4 days   SYMBICORT 160-4.5 MCG/ACT inhaler INHALE 2 PUFFS INTO THE LUNGS TWICE DAILY   No facility-administered encounter medications on file as of 12/30/2019.   Physical Exam: Blood pressure 128/72, pulse 86, temperature 97.6 F (36.4 C), temperature source Oral, height 5\' 10"  (1.778 m), weight 169 lb 3.2 oz (76.7 kg), last menstrual period 05/22/2012, SpO2 97 %. Gen:      No acute distress HEENT:  EOMI, sclera anicteric Neck:  No masses; no thyromegaly Lungs:    Clear to auscultation bilaterally; normal respiratory effort CV:         Regular rate and rhythm; no murmurs Abd:      + bowel sounds; soft, non-tender; no palpable masses, no distension Ext:    No edema; adequate peripheral perfusion Skin:      Warm and dry; no rash Neuro: alert and oriented x 3 Psych: normal mood and affect  Data Reviewed: Imaging: CT high-resolution 09/21/2016-biapical scarring.  No evidence of  interstitial lung disease.  4 mm nodule in the left major fissure. CTA 04/14/2018 no PE, patchy groundglass opacities and reticulation in the mid to lower lungs. Chest x-ray 04/26/2018-no active cardiopulmonary disease CTA 12/05/2019-no PE, subsegmental atelectasis, stable reticular changes in the right middle lobe and right lower lobe.  I have reviewed the images personally.  PFTs  04/09/2019 FVC 3.60 [102%], FEV1 2.73 [97%], F/F 76, TLC 5.09 [5%], DLCO 17.46 [39%] Minimal obstructive airway disease with mild diffusion defect  Labs: CBC 10/09/2018-WBC 3.6, hemoglobin 9.5, platelets 129, eos 0% Hypersensitivity panel 02/07/2019-negative  Assessment:  Asthma Has worsening asthma control Give prednisone 40 mg a day for 5 days Continue Symbicort Check CBC differential, IgE  Exposure history notable for down comforters.  She has gotten rid of them earlier this year Given worsening symptoms we will get high-res CT and PFTs We will review presence of interstitial lung disease CT scan.  Order hypersensitivity panel serologies  Allergic rhinitis, GERD Has significant issues with rhinitis, GERD Continue Flonase, chlorphentermine and Protonix twice daily  Autoimmune disease, arthritis, Sjogren's syndrome Her last imaging was in December 2019 with groundglass opacities at the bases.  She will need follow-up to make sure there is no underlying interstitial lung disease Previously followed by Dr. Corliss Skains, rheumatology and at [facility] rheumatology since she has not been seen since 2019  If this evidence of ILD then will need to reassess immunosuppressive therapy  Plan/Recommendations: - CBC, IgE - High-res CT, PFTs - Prednisone for 5 days - Continue Symbicort  Chilton Greathouse MD [facility] 12/30/2019, 4:18 PM  CC: No ref. provider found

## 2019-12-30 NOTE — Patient Instructions (Signed)
We will get CBC differential, IgE today Prednisone 40 mg a day for 5 days Continue Symbicort Schedule high-res CT and PFTs for evaluation of your lung Follow-up in 1 to 2 months.

## 2019-12-31 LAB — CBC WITH DIFFERENTIAL/PLATELET
Basophils Absolute: 0 10*3/uL (ref 0.0–0.1)
Basophils Relative: 0.9 % (ref 0.0–3.0)
Eosinophils Absolute: 0 10*3/uL (ref 0.0–0.7)
Eosinophils Relative: 0.8 % (ref 0.0–5.0)
HCT: 32.2 % — ABNORMAL LOW (ref 36.0–46.0)
Hemoglobin: 10.6 g/dL — ABNORMAL LOW (ref 12.0–15.0)
Lymphocytes Relative: 44.2 % (ref 12.0–46.0)
Lymphs Abs: 1.3 10*3/uL (ref 0.7–4.0)
MCHC: 32.9 g/dL (ref 30.0–36.0)
MCV: 85.9 fl (ref 78.0–100.0)
Monocytes Absolute: 0.4 10*3/uL (ref 0.1–1.0)
Monocytes Relative: 12.4 % — ABNORMAL HIGH (ref 3.0–12.0)
Neutro Abs: 1.2 10*3/uL — ABNORMAL LOW (ref 1.4–7.7)
Neutrophils Relative %: 41.7 % — ABNORMAL LOW (ref 43.0–77.0)
Platelets: 134 10*3/uL — ABNORMAL LOW (ref 150.0–400.0)
RBC: 3.75 Mil/uL — ABNORMAL LOW (ref 3.87–5.11)
RDW: 14.6 % (ref 11.5–15.5)
WBC: 2.9 10*3/uL — ABNORMAL LOW (ref 4.0–10.5)

## 2020-01-01 LAB — IGE: IgE (Immunoglobulin E), Serum: 1726 kU/L — ABNORMAL HIGH (ref <=114)

## 2020-01-09 ENCOUNTER — Encounter: Payer: Self-pay | Admitting: Pulmonary Disease

## 2020-01-13 ENCOUNTER — Ambulatory Visit (INDEPENDENT_AMBULATORY_CARE_PROVIDER_SITE_OTHER)
Admission: RE | Admit: 2020-01-13 | Discharge: 2020-01-13 | Disposition: A | Discharge status: Home / Self Care | Payer: BC Managed Care – PPO | Source: Ambulatory Visit | Attending: Pulmonary Disease | Admitting: Pulmonary Disease

## 2020-01-13 ENCOUNTER — Other Ambulatory Visit: Payer: Self-pay

## 2020-01-13 DIAGNOSIS — J4541 Moderate persistent asthma with (acute) exacerbation: Secondary | ICD-10-CM

## 2020-02-04 ENCOUNTER — Ambulatory Visit (INDEPENDENT_AMBULATORY_CARE_PROVIDER_SITE_OTHER): Payer: BC Managed Care – PPO | Admitting: Pulmonary Disease

## 2020-02-04 ENCOUNTER — Other Ambulatory Visit: Payer: Self-pay

## 2020-02-04 ENCOUNTER — Inpatient Hospital Stay: Payer: BC Managed Care – PPO | Attending: Oncology | Admitting: Oncology

## 2020-02-04 ENCOUNTER — Inpatient Hospital Stay: Payer: BC Managed Care – PPO

## 2020-02-04 VITALS — BP 148/98 | HR 91 | Temp 98.2°F | Resp 18 | Wt 177.6 lb

## 2020-02-04 DIAGNOSIS — Z79899 Other long term (current) drug therapy: Secondary | ICD-10-CM | POA: Diagnosis not present

## 2020-02-04 DIAGNOSIS — Z7951 Long term (current) use of inhaled steroids: Secondary | ICD-10-CM | POA: Insufficient documentation

## 2020-02-04 DIAGNOSIS — D709 Neutropenia, unspecified: Secondary | ICD-10-CM

## 2020-02-04 DIAGNOSIS — Z7952 Long term (current) use of systemic steroids: Secondary | ICD-10-CM | POA: Insufficient documentation

## 2020-02-04 DIAGNOSIS — G35 Multiple sclerosis: Secondary | ICD-10-CM | POA: Insufficient documentation

## 2020-02-04 DIAGNOSIS — Z791 Long term (current) use of non-steroidal anti-inflammatories (NSAID): Secondary | ICD-10-CM | POA: Diagnosis not present

## 2020-02-04 DIAGNOSIS — D649 Anemia, unspecified: Secondary | ICD-10-CM | POA: Insufficient documentation

## 2020-02-04 DIAGNOSIS — J4541 Moderate persistent asthma with (acute) exacerbation: Secondary | ICD-10-CM | POA: Diagnosis not present

## 2020-02-04 DIAGNOSIS — D72819 Decreased white blood cell count, unspecified: Secondary | ICD-10-CM | POA: Insufficient documentation

## 2020-02-04 LAB — PULMONARY FUNCTION TEST
DL/VA % pred: 91 %
DL/VA: 3.74 ml/min/mmHg/L
DLCO cor % pred: 78 %
DLCO cor: 19.57 ml/min/mmHg
DLCO unc % pred: 70 %
DLCO unc: 17.65 ml/min/mmHg
FEF 25-75 Post: 2.25 L/sec
FEF 25-75 Pre: 1.91 L/sec
FEF2575-%Change-Post: 17 %
FEF2575-%Pred-Post: 83 %
FEF2575-%Pred-Pre: 70 %
FEV1-%Change-Post: 6 %
FEV1-%Pred-Post: 96 %
FEV1-%Pred-Pre: 90 %
FEV1-Post: 2.67 L
FEV1-Pre: 2.51 L
FEV1FVC-%Change-Post: 11 %
FEV1FVC-%Pred-Pre: 88 %
FEV6-%Change-Post: -2 %
FEV6-%Pred-Post: 99 %
FEV6-%Pred-Pre: 102 %
FEV6-Post: 3.4 L
FEV6-Pre: 3.48 L
FEV6FVC-%Pred-Post: 102 %
FEV6FVC-%Pred-Pre: 102 %
FVC-%Change-Post: -4 %
FVC-%Pred-Post: 97 %
FVC-%Pred-Pre: 101 %
FVC-Post: 3.4 L
FVC-Pre: 3.54 L
Post FEV1/FVC ratio: 79 %
Post FEV6/FVC ratio: 100 %
Pre FEV1/FVC ratio: 71 %
Pre FEV6/FVC Ratio: 100 %
RV % pred: 73 %
RV: 1.62 L
TLC % pred: 89 %
TLC: 5.31 L

## 2020-02-04 LAB — CBC WITH DIFFERENTIAL (CANCER CENTER ONLY)
Abs Immature Granulocytes: 0 10*3/uL (ref 0.00–0.07)
Basophils Absolute: 0 10*3/uL (ref 0.0–0.1)
Basophils Relative: 0 %
Eosinophils Absolute: 0 10*3/uL (ref 0.0–0.5)
Eosinophils Relative: 0 %
HCT: 33.5 % — ABNORMAL LOW (ref 36.0–46.0)
Hemoglobin: 10.6 g/dL — ABNORMAL LOW (ref 12.0–15.0)
Immature Granulocytes: 0 %
Lymphocytes Relative: 38 %
Lymphs Abs: 1.2 10*3/uL (ref 0.7–4.0)
MCH: 27.2 pg (ref 26.0–34.0)
MCHC: 31.6 g/dL (ref 30.0–36.0)
MCV: 86.1 fL (ref 80.0–100.0)
Monocytes Absolute: 0.4 10*3/uL (ref 0.1–1.0)
Monocytes Relative: 13 %
Neutro Abs: 1.6 10*3/uL — ABNORMAL LOW (ref 1.7–7.7)
Neutrophils Relative %: 49 %
Platelet Count: 136 10*3/uL — ABNORMAL LOW (ref 150–400)
RBC: 3.89 MIL/uL (ref 3.87–5.11)
RDW: 14.5 % (ref 11.5–15.5)
WBC Count: 3.2 10*3/uL — ABNORMAL LOW (ref 4.0–10.5)
nRBC: 0 % (ref 0.0–0.2)

## 2020-02-04 NOTE — Progress Notes (Signed)
Hematology and Oncology Follow Up Visit  Megan Reid [id number] [birth year] 54 y.o. 02/04/2020 3:32 PM   Principle Diagnosis: 54 year old woman with neutropenia diagnosed in 2013 with etiology related to autoimmune process versus and variation.     Prior Therapy: She is status post a bone marrow biopsy which did not show any evidence of dysplasia. PET CT scan in October 2015 did not show any evidence of lymphadenopathy.  Current therapy: Active surveillance.   Interim History:  Ms Megan Reid returns today for a repeat evaluation.  Since the last visit, he reports no major changes in her health.  She has reported periodic cough which has been evaluated by pulmonary medicine.  She has not had any recent hospitalizations or illnesses.  Her performance status quality of life remains unchanged.  He denies any recent worsening joint pain or exacerbation.       Medications: Updated on review.  Current Outpatient Medications  Medication Sig Dispense Refill  . albuterol (PROVENTIL HFA;VENTOLIN HFA) 108 (90 Base) MCG/ACT inhaler Inhale 2 puffs into the lungs 2 (two) times daily. For shortness of breath 18 g 0  . fluticasone (FLONASE) 50 MCG/ACT nasal spray Place 2 sprays into both nostrils daily. 16 g 2  . ibuprofen (ADVIL) 800 MG tablet TAKE 1 TABLET(800 MG) BY MOUTH EVERY 8 HOURS AS NEEDED 30 tablet 0  . montelukast (SINGULAIR) 10 MG tablet TAKE 1 TABLET(10 MG) BY MOUTH EVERY EVENING 30 tablet 5  . pantoprazole (PROTONIX) 40 MG tablet Take 1 tablet (40 mg total) by mouth 2 (two) times daily. 60 tablet 2  . predniSONE (DELTASONE) 20 MG tablet Take 1 tablet (20 mg total) by mouth daily with breakfast. Take 56m a day for 5 days 10 tablet 0  . SYMBICORT 160-4.5 MCG/ACT inhaler INHALE 2 PUFFS INTO THE LUNGS TWICE DAILY 10.2 g 5   No current facility-administered medications for this visit.    Allergies:  Allergies  Allergen Reactions  . Strawberry Extract Other (See Comments)    Unknown  reaction  . Sulfa Antibiotics Anaphylaxis, Hives and Swelling    Swelling of the face and tongue       Physical Exam:    's ECOG: 1     General appearance: Alert, awake without any distress. Head: Atraumatic without abnormalities Oropharynx: Without any thrush or ulcers. Eyes: No scleral icterus. Lymph nodes: No lymphadenopathy noted in the cervical, supraclavicular, or axillary nodes Heart:regular rate and rhythm, without any murmurs or gallops.   Lung: Clear to auscultation without any rhonchi, wheezes or dullness to percussion. Abdomin: Soft, nontender without any shifting dullness or ascites. Musculoskeletal: No clubbing or cyanosis. Neurological: No motor or sensory deficits. Skin: No rashes or lesions.      Lab Results: Lab Results  Component Value Date   WBC 2.9 (L) 12/30/2019   HGB 10.6 (L) 12/30/2019   HCT 32.2 (L) 12/30/2019   MCV 85.9 12/30/2019   PLT 134.0 (L) 12/30/2019     Chemistry      Component Value Date/Time   NA 137 12/05/2019 0310   NA 142 09/29/2015 0000   NA 139 04/13/2015 1323   K 3.9 12/05/2019 0310   K 4.2 04/13/2015 1323   CL 104 12/05/2019 0310   CL 104 03/23/2013 0101   CL 105 01/11/2012 1349   CO2 27 12/05/2019 0310   CO2 26 04/13/2015 1323   BUN 16 12/05/2019 0310   BUN 18 09/29/2015 0000   BUN 15.9 04/13/2015 1323   CREATININE 0.88  12/05/2019 0310   CREATININE 0.89 10/30/2016 1046   CREATININE 0.9 04/13/2015 1323   GLU 92 09/29/2015 0000      Component Value Date/Time   CALCIUM 9.4 12/05/2019 0310   CALCIUM 9.7 04/13/2015 1323   ALKPHOS 47 12/05/2019 0310   ALKPHOS 65 04/13/2015 1323   AST 36 12/05/2019 0310   AST 26 04/13/2015 1323   ALT 17 12/05/2019 0310   ALT 13 04/13/2015 1323   BILITOT 0.3 12/05/2019 0310   BILITOT 0.30 04/13/2015 1323        Impression and Plan:  54 year old woman with:   1.  Leukocytopenia with mild neutropenia diagnosed in 2013.  Her work-up at that time did not reveal any  hematological condition.    He has been on active surveillance without any indication for any treatment or additional work-up.  Laboratory data reviewed CBC obtained on 12/30/2019 showed white cell count consistent with her baseline with absolute neutrophil count of 1200.  Repeat CBC today was reviewed and compared to previous counts in the last 8 years.  Her white cell count currently at 3.3 with an absolute count of 800.   2.  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  Her hemoglobin today is 10.6 which is consistent with her baseline.   3.  Follow-up: In 6 months for repeat follow-up.  20 minutes were dedicated to this encounter.  Time was spent on reviewing laboratory data, discussing differential diagnosis and future plan of care reviewed.   Megan Button, MD 10/20/20213:32 PM

## 2020-02-04 NOTE — Progress Notes (Signed)
Full PFT performed today. °

## 2020-03-10 ENCOUNTER — Ambulatory Visit (INDEPENDENT_AMBULATORY_CARE_PROVIDER_SITE_OTHER): Payer: BC Managed Care – PPO | Admitting: Pulmonary Disease

## 2020-03-10 ENCOUNTER — Other Ambulatory Visit: Payer: Self-pay

## 2020-03-10 ENCOUNTER — Encounter: Payer: Self-pay | Admitting: Pulmonary Disease

## 2020-03-10 VITALS — BP 130/76 | HR 74 | Temp 98.2°F | Ht 69.0 in | Wt 176.0 lb

## 2020-03-10 DIAGNOSIS — J4550 Severe persistent asthma, uncomplicated: Secondary | ICD-10-CM | POA: Diagnosis not present

## 2020-03-10 NOTE — Progress Notes (Addendum)
Megan Reid    [id number]    [birth year]  Primary Care Physician:Varadarajan, Soyla Murphy, MD  Referring Physician: No referring provider defined for this encounter.  Chief complaint: Follow up for asthma  HPI: 54 year old with history of asthma, autoimmune disease with rheumatoid arthritis, Sjogren's syndrome  Previously followed by Dr. Corliss Skains and rheumatology at [facility].  She was on plaquenil, methotrexate and folic acid, prednisone.  She has not followed up with rheumatology since 2018 and is currently not on treatment.  Last rheumatology note was from [facility] in 2018 where they discussed possibly needing leflunomide or CellCept.  But she has not followed back since the co-pay was high at [facility] She had a high-res CT in 2018 with no evidence of interstitial lung disease.  CTA in December 2019 showed some groundglass opacities at the bases but no follow-up CT was done  Has history of asthma for which she is on Symbicort but is using it 2 puffs once a day.  Also has significant allergic rhinitis, GERD which is untreated at present.  Pets: Has a cat.  No birds, farm animal Occupation: Runner, broadcasting/film/video for fourth grade Exposures: Reports mold exposure at school but currently she is working remotely from home.  She also has a comfortable.  No hot tubs, Jacuzzi's, humidifier Smoking history: Remote smoking history as a teenager Travel history: Previously lived in [place], [place].  She has been in [place] for the past 7 years.  No significant recent travel Relevant family history: Father had emphysema.  He was a smoker.  Interim history: Has worsening asthma control with increasing dyspnea, shortness of breath She is using her Symbicort only once a day   Outpatient Encounter Medications as of 03/10/2020  Medication Sig  . albuterol (PROVENTIL HFA;VENTOLIN HFA) 108 (90 Base) MCG/ACT inhaler Inhale 2 puffs into the lungs 2 (two) times daily. For shortness of breath  . fluticasone  (FLONASE) 50 MCG/ACT nasal spray Place 2 sprays into both nostrils daily.  Marland Kitchen ibuprofen (ADVIL) 800 MG tablet TAKE 1 TABLET(800 MG) BY MOUTH EVERY 8 HOURS AS NEEDED  . montelukast (SINGULAIR) 10 MG tablet TAKE 1 TABLET(10 MG) BY MOUTH EVERY EVENING  . pantoprazole (PROTONIX) 40 MG tablet Take 1 tablet (40 mg total) by mouth 2 (two) times daily.  . SYMBICORT 160-4.5 MCG/ACT inhaler INHALE 2 PUFFS INTO THE LUNGS TWICE DAILY  . [DISCONTINUED] predniSONE (DELTASONE) 20 MG tablet Take 1 tablet (20 mg total) by mouth daily with breakfast. Take 40mg  a day for 5 days   No facility-administered encounter medications on file as of 03/10/2020.   Physical Exam: Blood pressure 130/76, pulse 74, temperature 98.2 F (36.8 C), temperature source Skin, height 5\' 9"  (1.753 m), weight 176 lb (79.8 kg), last menstrual period 05/22/2012, SpO2 99 %. Gen:      No acute distress HEENT:  EOMI, sclera anicteric Neck:     No masses; no thyromegaly Lungs:    Clear to auscultation bilaterally; normal respiratory effort CV:         Regular rate and rhythm; no murmurs Abd:      + bowel sounds; soft, non-tender; no palpable masses, no distension Ext:    No edema; adequate peripheral perfusion Skin:      Warm and dry; no rash Neuro: alert and oriented x 3 Psych: normal mood and affect  Data Reviewed: Imaging: CT high-resolution 09/21/2016-biapical scarring.  No evidence of interstitial lung disease.  4 mm nodule in the left major  fissure. CTA 04/14/2018 no PE, patchy groundglass opacities and reticulation in the mid to lower lungs. Chest x-ray 04/26/2018-no active cardiopulmonary disease CTA 12/05/2019-no PE, subsegmental atelectasis, stable reticular changes in the right middle lobe and right lower lobe.  I have reviewed the images personally.  PFTs  04/09/2019 FVC 3.60 [102%], FEV1 2.73 [97%], F/F 76, TLC 5.09 [5%], DLCO 17.46 [69%]  02/04/2019 FVC 3.40 [97%], FEV1 2.67 [96%], F/F 79, TLC 5.31 [89%], DLCO 17.65  [70%] Minimal obstructive airway disease with minimal diffusion defect  Act score 01/09/2020-10 Act score 03/10/2020-11  Labs: CBC 10/09/2018-WBC 3.6, hemoglobin 9.5, platelets 129, eos 0% CBC 02/04/2020-WBC 2.2, eos 0% IgE 12/30/2019-1726  Hypersensitivity panel 02/07/2019-negative  Assessment:  Asthma Has worsening asthma control She needs to use her Symbicort twice daily Continue Flonase  Given persistent symptoms of severe asthma we will start her on Xolair injection therapy.  She has elevated IgE but no peripheral eosinophilia. She is also requesting a referral to allergy to get skin testing for food allergies.  Exposure history notable for down comforters.  She has gotten rid of them earlier this year No evidence of ILD on high-res CT earlier this year.  Allergic rhinitis, GERD Has significant issues with rhinitis, GERD Continue Flonase and Protonix  Autoimmune disease, arthritis, Sjogren's syndrome Her last imaging was in December 2019 with groundglass opacities at the bases.  She will need follow-up to make sure there is no underlying interstitial lung disease Previously followed by Dr. Corliss Skains, rheumatology and at [facility] rheumatology since she has not been seen since 2019 No evidence of ILD on high-res CT  Plan/Recommendations: - Increase Symbicort to 2 puffs twice daily, continue Singulair - Start paperwork for Xolair - Allergy referral  Chilton Greathouse MD [facility] 03/10/2020, 9:40 AM  CC: No ref. provider found

## 2020-03-10 NOTE — Addendum Note (Signed)
Addended by: Jacquiline Doe on: 03/10/2020 10:11 AM   Modules accepted: Orders

## 2020-03-10 NOTE — Patient Instructions (Signed)
Since you are having persistent symptoms of asthma we will increase your Symbicort to 2 puffs twice daily We will start paperwork for an injection therapy called Zoloft Referral to allergy for further testing  Follow-up in 3 months.

## 2020-03-16 ENCOUNTER — Telehealth: Payer: Self-pay | Admitting: Pharmacy Technician

## 2020-03-16 DIAGNOSIS — J4550 Severe persistent asthma, uncomplicated: Secondary | ICD-10-CM

## 2020-03-16 NOTE — Telephone Encounter (Signed)
Received New start paperwork for XOLAIR. Will update as we work through the benefits process.  

## 2020-03-26 NOTE — Telephone Encounter (Signed)
Her pre-treatment IgE is 1726 kU/L and weight is 79.8kg. It looks like there are no dosing recommendations based on the FDA-approved dosing guide. She's essentially past the weight cut off.   Dr. Isaiah Serge - Please advise. Do you want to still move forward with applying for 375mg  every 2 weeks?   , PharmD, MPH Clinical Pharmacist (Rheumatology and Pulmonology)

## 2020-03-26 NOTE — Telephone Encounter (Signed)
Yes. Please apply for 375 mg q2 weeks. Thanks

## 2020-03-29 NOTE — Telephone Encounter (Signed)
Received notification from [facility] regarding a prior authorization for XOLAIR. Authorization has been APPROVED from 03/29/20 to 09/27/20.   Patient must fill through [facility]  Authorization # [id number] Phone # [phone]  Chesley Mires, PharmD, MPH Clinical Pharmacist (Rheumatology and Pulmonology)

## 2020-03-29 NOTE — Telephone Encounter (Signed)
Submitted a Prior Authorization request to [facility] for Geoffry Paradise via Cover My Meds. Will update once we receive a response.   Key: [id number] - PA Case ID: [id number]

## 2020-04-01 MED ORDER — OMALIZUMAB 75 MG/0.5ML ~~LOC~~ SOSY: 75.0000 mg | PREFILLED_SYRINGE | SUBCUTANEOUS | 11 refills | Status: DC

## 2020-04-01 MED ORDER — OMALIZUMAB 150 MG/ML ~~LOC~~ SOSY: 300.0000 mg | PREFILLED_SYRINGE | SUBCUTANEOUS | 11 refills | Status: DC

## 2020-04-01 MED ORDER — OMALIZUMAB 150 MG ~~LOC~~ SOLR: 300.0000 mg | SUBCUTANEOUS | 11 refills | Status: DC

## 2020-04-01 NOTE — Telephone Encounter (Signed)
Xolair 375mg  prescription sent to [facility]. Will follow up.

## 2020-04-01 NOTE — Telephone Encounter (Signed)
Called patient and advised of approval. I assisted patient to enroll for Xolair copay over the phone. Also provided patient with [facility] phone number.  Please send [facility]olair prescriptions [facility]. Dr. Isaiah Serge advised of dose above.  Xolair copay card infoJohnney [id number]  [id number]

## 2020-04-07 NOTE — Telephone Encounter (Signed)
Called and spoke with [facility] representative. Attempted to schedule Xolair shipment.  Copywriter, advertising given.  Xolair is being processed through benefits claim and someone will reach out to set up delivery.

## 2020-04-14 NOTE — Telephone Encounter (Signed)
ATC Patient.  LM for Patient to call office to schedule first Xolair injection, review office policy, and discuss Epipen.

## 2020-04-14 NOTE — Telephone Encounter (Signed)
Xolair Prefilled Syringe Order: 150mg  Prefilled Syringe:  #4 75mg  Prefilled Syringe: #2 Ordered Date: 04/14/20 Expected date of arrival: 04/20/20 Ordered by: Hennie Gosa,LPN Specialty Pharmacy: [facility]

## 2020-04-23 NOTE — Telephone Encounter (Signed)
Xolair Prefilled Syringe Received:  150mg  Prefilled Syringe >> quantity #4, lot # , exp date 03/16/2021 75mg  Prefilled Syringe >> quantity #2, lot # 03/18/2021, exp date 10/14/2020 Medication arrival date: 04/23/20 Received by: Mayleen Borrero,LPN

## 2020-04-24 ENCOUNTER — Ambulatory Visit: Payer: Self-pay | Attending: Internal Medicine

## 2020-04-24 DIAGNOSIS — Z23 Encounter for immunization: Secondary | ICD-10-CM

## 2020-04-24 NOTE — Progress Notes (Signed)
   Covid-19 Vaccination Clinic  Name:  Megan Reid    MRN: [id number] DOB: [birth year]  04/24/2020  Megan Reid was observed post Covid-19 immunization for 15 minutes without incident. She was provided with Vaccine Information Sheet and instruction to access the V-Safe system.   Megan Reid was instructed to call 911 with any severe reactions post vaccine: Marland Kitchen Difficulty breathing  . Swelling of face and throat  . A fast heartbeat  . A bad rash all over body  . Dizziness and weakness   Immunizations Administered    Name Date Dose VIS Date Route   Moderna Covid-19 Booster Vaccine 04/24/2020 11:31 AM 0.25 mL 02/04/2020 Intramuscular   Manufacturer: Moderna   Lot: [id number]   NDC: 10272-536-64

## 2020-04-30 NOTE — Telephone Encounter (Signed)
ATC patient to schedule Xolair start - medication already received from [facility]. Phone kept ringing. Will send MyChart message. No rx for Epipen sent yet. Ezra Sites or I will f/u again

## 2020-05-04 ENCOUNTER — Ambulatory Visit: Payer: BC Managed Care – PPO | Admitting: Allergy and Immunology

## 2020-05-06 MED ORDER — EPINEPHRINE 0.3 MG/0.3ML IJ SOAJ: 0.3000 mg | Freq: Once | INTRAMUSCULAR | 5 refills | Status: AC

## 2020-05-06 NOTE — Addendum Note (Signed)
Addended by: Jacquiline Doe on: 05/06/2020 12:40 PM   Modules accepted: Orders

## 2020-05-06 NOTE — Telephone Encounter (Signed)
Called and spoke with Patient.  Patient scheduled for first Xolair injection 06/01/20 at 2pm.  Patient aware of office injection protocol. Epi pen prescription sent to [facility] per Patient request. Nothing further at this time.

## 2020-06-01 ENCOUNTER — Ambulatory Visit (INDEPENDENT_AMBULATORY_CARE_PROVIDER_SITE_OTHER): Payer: BC Managed Care – PPO

## 2020-06-01 ENCOUNTER — Other Ambulatory Visit: Payer: Self-pay

## 2020-06-01 DIAGNOSIS — J4550 Severe persistent asthma, uncomplicated: Secondary | ICD-10-CM | POA: Diagnosis not present

## 2020-06-01 MED ORDER — OMALIZUMAB 75 MG/0.5ML ~~LOC~~ SOSY
75.0000 mg | PREFILLED_SYRINGE | Freq: Once | SUBCUTANEOUS | Status: AC
Start: 2020-06-01 — End: 2020-06-01
  Administered 2020-06-01: 75 mg via SUBCUTANEOUS

## 2020-06-01 MED ORDER — OMALIZUMAB 150 MG/ML ~~LOC~~ SOSY
300.0000 mg | PREFILLED_SYRINGE | Freq: Once | SUBCUTANEOUS | Status: AC
  Administered 2020-06-01: 300 mg via SUBCUTANEOUS

## 2020-06-01 NOTE — Progress Notes (Signed)
Patient presented to the office today for first-time Xolair injection.  Primary Pulmonologist: Chilton Greathouse MD Medication name: Xolair Strength: 375mg  Site(s): right arm and left arm  Epi pen/Auvi-Q visible during appointment: Yes  Time of injection: 1430  Patient evaluated every 15-20 minutes per protocol x2 hours.  1st check: 1445 Evaluation:Patient sitting in chair. Patient denies any side or adverse effects.  2nd check: 1500  Evaluation: Patient denies any problems at this time.  3rd check: 1515  Evaluation: Patient denies problems.  4th check: 1530   Evaluation:Patient denies problems  5th check: 1545  Evaluation:Patient denies problems.  6th check: 1600  Evaluation: patient sitting quietly. Patient denies problems.  7th check: 1615  Evaluation: Patient sitting quietly.  Patient denies any problems.  8th check: 1630  Evaluation: Patient denies any problems. No redness or swelling at right or left injection sites.

## 2020-06-10 ENCOUNTER — Telehealth: Payer: Self-pay | Admitting: Pulmonary Disease

## 2020-06-10 NOTE — Telephone Encounter (Signed)
Xolair Prefilled Syringe Received:  150mg  Prefilled Syringe >> quantity #4, lot # , exp date 03/16/2021 75mg  Prefilled Syringe >> quantity #2, lot # 03/18/2021, exp date 03/16/2021 Medication arrival date: 06/10/20 Received by: Holy Battenfield,LPN

## 2020-06-15 ENCOUNTER — Ambulatory Visit: Payer: BC Managed Care – PPO

## 2020-06-16 ENCOUNTER — Other Ambulatory Visit: Payer: Self-pay

## 2020-06-16 ENCOUNTER — Ambulatory Visit (INDEPENDENT_AMBULATORY_CARE_PROVIDER_SITE_OTHER): Payer: BC Managed Care – PPO

## 2020-06-16 DIAGNOSIS — J4550 Severe persistent asthma, uncomplicated: Secondary | ICD-10-CM

## 2020-06-16 MED ORDER — OMALIZUMAB 75 MG/0.5ML ~~LOC~~ SOSY
75.0000 mg | PREFILLED_SYRINGE | Freq: Once | SUBCUTANEOUS | Status: AC
  Administered 2020-06-16: 75 mg via SUBCUTANEOUS

## 2020-06-16 MED ORDER — OMALIZUMAB 150 MG/ML ~~LOC~~ SOSY
300.0000 mg | PREFILLED_SYRINGE | Freq: Once | SUBCUTANEOUS | Status: AC
  Administered 2020-06-16: 300 mg via SUBCUTANEOUS

## 2020-06-16 NOTE — Progress Notes (Signed)
Patient brought spouse to injection appointment.  Reviewed injection sites and Spouse done injection in right arm. Patient scheduled with [facility], Pharmacist 06/30/20 at 3:20pm for last self injection.  Have you been hospitalized within the last 10 days?  No Do you have a fever?  No Do you have a cough?  No Do you have a headache or sore throat? No Do you have your Epi Pen visible and is it within date?  Yes

## 2020-06-21 ENCOUNTER — Encounter: Payer: Self-pay | Admitting: Allergy

## 2020-06-21 ENCOUNTER — Ambulatory Visit (INDEPENDENT_AMBULATORY_CARE_PROVIDER_SITE_OTHER): Payer: BC Managed Care – PPO | Admitting: Allergy

## 2020-06-21 ENCOUNTER — Other Ambulatory Visit: Payer: Self-pay

## 2020-06-21 VITALS — BP 128/80 | HR 86 | Temp 97.6°F | Resp 17 | Ht 70.08 in | Wt 171.8 lb

## 2020-06-21 DIAGNOSIS — M3500 Sicca syndrome, unspecified: Secondary | ICD-10-CM | POA: Diagnosis not present

## 2020-06-21 DIAGNOSIS — R7689 Other specified abnormal immunological findings in serum: Secondary | ICD-10-CM

## 2020-06-21 DIAGNOSIS — T7819XD Other adverse food reactions, not elsewhere classified, subsequent encounter: Secondary | ICD-10-CM

## 2020-06-21 DIAGNOSIS — T148XXA Other injury of unspecified body region, initial encounter: Secondary | ICD-10-CM

## 2020-06-21 DIAGNOSIS — R12 Heartburn: Secondary | ICD-10-CM

## 2020-06-21 DIAGNOSIS — J310 Chronic rhinitis: Secondary | ICD-10-CM | POA: Diagnosis not present

## 2020-06-21 DIAGNOSIS — J4550 Severe persistent asthma, uncomplicated: Secondary | ICD-10-CM

## 2020-06-21 DIAGNOSIS — T781XXD Other adverse food reactions, not elsewhere classified, subsequent encounter: Secondary | ICD-10-CM

## 2020-06-21 DIAGNOSIS — R768 Other specified abnormal immunological findings in serum: Secondary | ICD-10-CM

## 2020-06-21 NOTE — Patient Instructions (Addendum)
Today's skin testing showed: Negative to common foods including gluten.  Negative to indoor/outdoor allergens.  Results given.   Asthma: . You are not allergic to gluten - most likely it is the dryness of the bread causing the coughing and you are not allergic to gluten as you eat other gluten foods with no issues.  . Continue follow up with pulmonology.  . Daily controller medication(s): Symbicort 2 puffs twice a day with spacer and rinse mouth afterwards. . Continue Xolair injections as per pulmonology.  . May use albuterol rescue inhaler 2 puffs every 4 to 6 hours as needed for shortness of breath, chest tightness, coughing, and wheezing. May use albuterol rescue inhaler 2 puffs 5 to 15 minutes prior to strenuous physical activities. Monitor frequency of use.  . Asthma control goals:  o Full participation in all desired activities (may need albuterol before activity) o Albuterol use two times or less a week on average (not counting use with activity) o Cough interfering with sleep two times or less a month o Oral steroids no more than once a year o No hospitalizations  Reflux:  Continue Protonix 40mg  daily and nothing to eat/drink for 30 minutes afterwards.  Continue lifestyle and dietary modifications as below.  Elevated IgE:  Will check environmental allergy panel via bloodwork. If negative, may need to do some additional work up. Get bloodwork:  We are ordering labs, so please allow 1-2 weeks for the results to come back. With the newly implemented Cures Act, the labs might be visible to you at the same time that they become visible to me. However, I will not address the results until all of the results are back, so please be patient.  In the meantime, continue recommendations in your patient instructions, including avoidance measures (if applicable), until you hear from me.  Follow up in 2 months or sooner if needed.   Follow up with hematologist regarding the  bruising.    Heartburn Heartburn is a type of pain or discomfort that can happen in your throat or chest. It is often described as a burning pain. It may also cause a bad, acid-like taste in your mouth. It may be caused by stomach contents that move back up (reflux) into the part of the body that moves food from your mouth to your stomach (esophagus). Heartburn may feel worse:  When you lie down.  When you bend over.  At night. Follow these instructions at home: Eating and drinking  Avoid certain foods and drinks as told by your doctor. This may include: ? Coffee and tea, with or without caffeine. ? Drinks that have alcohol. ? Energy drinks and sports drinks. ? Carbonated drinks or sodas. ? Chocolate and cocoa. ? Peppermint and mint flavorings. ? Garlic and onions. ? Horseradish. ? Spicy and acidic foods, such as:  Peppers.  Chili powder and curry powder.  Vinegar.  Hot sauces and BBQ sauce. ? Citrus fruit juices and citrus fruits, such as:  Oranges.  Lemons.  Limes. ? Tomato-based foods, such as:  Red sauce and pizza with red sauce.  Chili.  Salsa. ? Fried and fatty foods, such as:  Donuts.  fries and potato chips.  High-fat dressings. ? High-fat meats, such as:  Hot dogs and sausage.  Rib eye steak.  Ham and bacon. ? High-fat dairy items, such as:  Whole milk.  Butter.  Cream cheese.  Eat small meals often. Avoid eating large meals.  Avoid drinking large amounts of liquid  with your meals.  Avoid eating meals during the 2-3 hours before bedtime.  Avoid lying down right after you eat.  Do not exercise right after you eat.   Lifestyle  If you are overweight, lose an amount of weight that is healthy for you. Ask your doctor about a safe weight loss goal.  Do not smoke or use any products that contain nicotine or tobacco. These can make your symptoms worse. If you need help quitting, ask your doctor.  Wear loose clothes. Do not  wear anything tight around your waist.  Raise (elevate) the head of your bed about 6 inches (15 cm) when you sleep. You can use a wedge to do this.  Try to lower your stress. If you need help doing this, ask your doctor.      Medicines  Take over-the-counter and prescription medicines only as told by your doctor.  Do not take aspirin or NSAIDs, such as ibuprofen, unless your doctor says it is okay.  Stop medicines only as told by your doctor. If you stop taking some medicines too quickly, your symptoms may get worse. General instructions  Watch for any changes in your symptoms.  Keep all follow-up visits. Contact a doctor if:  You have new symptoms.  You lose weight and you do not know why.  You have trouble swallowing, or it hurts to swallow.  You have wheezing or a cough that keeps happening.  Your symptoms do not get better with treatment.  You have heartburn often for more than 2 weeks. Get help right away if:  You have pain in your arms, neck, jaw, teeth, or back all of a sudden.  You feel sweaty, dizzy, or light-headed all of a sudden.  You have chest pain or shortness of breath.  You vomit and your vomit looks like blood or coffee grounds.  Your poop (stool) is bloody or black. These symptoms may be an emergency. Get help right away. Call your local emergency services (911 in the [place]  Do not wait to see if the symptoms will go away.  Do not drive yourself to the hospital. Summary  Heartburn is a type of pain that can happen in your throat or chest. It can feel like a burning pain. It may also cause a bad, acid-like taste in your mouth.  You may need to avoid certain foods and drinks to help your symptoms. Ask your doctor what foods and drinks you should avoid.  Take over-the-counter and prescription medicines only as told by your doctor. Do not take aspirin or NSAIDs, such as ibuprofen, unless your doctor told you to do so.  Contact your doctor if  your symptoms do not get better or they get worse. This information is not intended to replace advice given to you by your health care provider. Make sure you discuss any questions you have with your health care provider. Document Revised: 10/08/2019 Document Reviewed: 10/08/2019 Elsevier Patient Education  2021 [employer].

## 2020-06-21 NOTE — Progress Notes (Unsigned)
New Patient Note  RE: Megan Reid MRN: [id number] DOB: 01-Mar-1966 Date of Office Visit: 06/21/2020  Referring provider: Marshell Garfinkel, MD Primary care provider: Leeroy Cha, MD  Chief Complaint: Asthma  History of Present Illness: I had the pleasure of seeing Megan Reid for initial evaluation at the [facility] on 06/23/2020. She is a 55 y.o. female, who is referred here by pulmonology for the evaluation of allergies and asthma.  Food: Patient concerned about food allergies making her asthma symptoms worse. She noticed that after eating bread, she tends to have an asthma flare but interestingly she can have pasta made of wheat with no issues.   Past work up includes: none. Dietary History: patient has been eating other foods including milk, eggs, peanut, treenuts, sesame, shellfish, fish, wheat, meats, fruits and vegetables. No soy ingestion.   She reports reading labels and avoiding bread in diet completely.   Rhinitis:  She reports symptoms of rhinorrhea. Symptoms have been going on for 2 years. The symptoms are present all year around with worsening in winter. Anosmia: yes for the past 3 years. Headache: yes. She has used Flonase 2 sprays, Singulair, zyrtec with some improvement in symptoms. Sinus infections: no. Previous work up includes: skin testing 2010 was positive to sulfa?, strawberries and grass per patient report.  Patient eats strawberries with no issues.  Previous ENT evaluation: yes for Sjogren's, no prior nasal surgery. History of nasal polyps: no.  Asthma: She reports symptoms of chest tightness, shortness of breath, coughing with post tussive emesis, wheezing for 10 years. Current medications include Symbicort 137mg 2 puffs twice a day x 2 yrs and albuterol prn which help. She recently started Xolair 379mevery 2 weeks.  She reports not using aerochamber with inhalers. She tried the following inhalers: no. Main triggers are  allergies, strong scents.   In the last month, frequency of symptoms: daily. Frequency of nocturnal symptoms: 0x/month. Frequency of SABA use: 2x/daily. Interference with physical activity: yes. Sleep is undisturbed. In the last 12 months, emergency room visits/urgent care visits or hospitalizations due to respiratory issues: one. In the last 12 months, oral steroids courses: 0. Lifetime history of hospitalization for respiratory issues: no. Prior intubations: no. History of pneumonia: yes. She was evaluated by allergist/pulmonologist in the past. Smoking exposure: no. Up to date with flu vaccine: no. Up to date with COVID-19 vaccine: no.  History of reflux: currently on Protonix 4066maily.  03/10/2020 pulm visit: "Asthma Has worsening asthma control She needs to use her Symbicort twice daily Continue Flonase  Given persistent symptoms of severe asthma we will start her on Xolair injection therapy.  She has elevated IgE but no peripheral eosinophilia. She is also requesting a referral to allergy to get skin testing for food allergies.  Exposure history notable for down comforters.  She has gotten rid of them earlier this year No evidence of ILD on high-res CT earlier this year.  Allergic rhinitis, GERD Has significant issues with rhinitis, GERD Continue Flonase and Protonix  Autoimmune disease, arthritis, Sjogren's syndrome Her last imaging was in December 2019 with groundglass opacities at the bases.  She will need follow-up to make sure there is no underlying interstitial lung disease Previously [facility] since she has not been seen since 2019 No evidence of ILD on high-res CT  Plan/Recommendations: - Increase Symbicort to 2 puffs twice daily, continue Singulair - Start paperwork for Xolair - Allergy referral"  Assessment and Plan: Megan Reid is a 55 y.o. female with: Adverse reaction to food, subsequent encounter Patient  concerned if food allergies are making her asthma symptoms worse. Especially noted that bread tends to cause coughing but tolerates other gluten containing products such as pasta without any issues.  Based on clinical history patient does not seem to have any IgE mediated food allergies. She still would like to pursue testing.  Today's skin testing showed: Negative to common foods including gluten.  . Discussed with patient that she is not allergic to gluten. It's most likely the dryness of the bread causing the coughing which may be exacerbated by her underlying Sjogren's.  . No dietary restrictions - may want to avoid dry foods such as bread and crackers if they seem to cause coughing.  Chronic rhinitis Perennial rhinitis symptoms for 2 years with worsening in the winter.  Tried Flonase, Singulair and Zyrtec with some benefit.  Testing over 10 years ago was positive to grass per patient report.  No prior allergy immunotherapy. Elevated IgE level.   Today's skin testing showed: Negative to indoor/outdoor allergens.   Will check environmental allergy panel via bloodwork as well due to elevated IgE level.   May use Flonase (fluticasone) nasal spray 1 spray per nostril twice a day as needed for nasal congestion.   Not well controlled severe persistent asthma Managed by pulmonology and recently started on Xolair 354m every 2 weeks. . Continue follow up with pulmonology.  . Daily controller medication(s): Symbicort 168m 2 puffs twice a day with spacer and rinse mouth afterwards. . Continue Xolair injections as per pulmonology.  . Continue Singulair (montelukast) 1022maily at night. . May use albuterol rescue inhaler 2 puffs every 4 to 6 hours as needed for shortness of breath, chest tightness, coughing, and wheezing. May use albuterol rescue inhaler 2 puffs 5 to 15 minutes prior to strenuous physical activities. Monitor frequency of use.   Heartburn  Continue Protonix 11m75mily and  nothing to eat/drink for 30 minutes afterwards.  Continue lifestyle and dietary modifications as below.  Bruising Patient has history of low WBC and low platelets - follows with hematology.  Advised patient to follow up with her hematology regarding the recent bruises which are unusual for her.    Return in about 2 months (around 08/21/2020).  No orders of the defined types were placed in this encounter.   Lab Orders     Allergens w/Total IgE Area 2  Other allergy screening: Medication allergy: yes Hymenoptera allergy: no Urticaria: no Eczema:no History of recurrent infections suggestive of immunodeficency: no  Diagnostics: Skin Testing: Environmental allergy panel and select foods. Negative to common foods including gluten.  Negative to indoor/outdoor allergens.  Results discussed with patient/family.  Airborne Adult Perc - 06/21/20 1542    Time Antigen Placed 1542    Allergen Manufacturer GreeLavella HammockLocation Back    Number of Test 59    Panel 1 Select    1. Control-Buffer 50% Glycerol Negative    2. Control-Histamine 1 mg/ml 2+    3. Albumin saline Negative    4. BahiRockyative    5. BermGuatemalaative    6. Johnson Negative    7. [place] Lake[place] Negative    8. Meadow Fescue Negative    9. Perennial Rye Negative    10. Sweet Vernal Negative    11. Timothy Negative    12. Cocklebur Negative    13. Burweed Marshelder Negative    14.  Ragweed, short Negative    15. Ragweed, Giant Negative    16. Plantain,  English Negative    17. Lamb's Quarters Negative    18. Sheep Sorrell Negative    19. Rough Pigweed Negative    20. Marsh Elder, Rough Negative    21. Mugwort, Common Negative    22. Ash mix Negative    23. Birch mix Negative    24. Beech American Negative    25. Box, Elder Negative    26. Cedar, red Negative    27. Cottonwood, [place] Negative    28. Elm mix Negative    29. Hickory Negative    30. Maple mix Negative    31. Oak, [place] mix Negative    32.  Pecan Pollen Negative    33. Pine mix Negative    34. Sycamore Eastern Negative    35. [place], Black Pollen Negative    36. Alternaria alternata Negative    37. Cladosporium Herbarum Negative    38. Aspergillus mix Negative    39. Penicillium mix Negative    40. Bipolaris sorokiniana (Helminthosporium) Negative    41. Drechslera spicifera (Curvularia) Negative    42. Mucor plumbeus Negative    43. Fusarium moniliforme Negative    44. Aureobasidium pullulans (pullulara) Negative    45. Rhizopus oryzae Negative    46. Botrytis cinera Negative    47. Epicoccum nigrum Negative    48. Phoma betae Negative    49. Candida Albicans Negative    50. Trichophyton mentagrophytes Negative    51. Mite, D Farinae  5,000 AU/ml Negative    52. Mite, D Pteronyssinus  5,000 AU/ml Negative    53. Cat Hair 10,000 BAU/ml Negative    54.  Dog Epithelia Negative    55. Mixed Feathers Negative    56. Horse Epithelia Negative    57. Cockroach, German Negative    58. Mouse Negative    59. Tobacco Leaf Negative          Food Perc - 06/21/20 1542      Test Information   Time Antigen Placed 1542    Allergen Manufacturer Lavella Hammock    Location Back    Number of allergen test [place]   1. Peanut Negative    2. Soybean food Negative    3. Wheat, whole Negative    4. Sesame Negative    5. Milk, cow Negative    6. Egg White, chicken Negative    7. Casein Negative    8. Shellfish mix Negative    9. Fish mix Negative    10. Cashew Negative          Intradermal - 06/21/20 1638    Time Antigen Placed 1620    Allergen Manufacturer Lavella Hammock    Location Arm    Number of Test 15    Intradermal Select    Control Negative    Guatemala Negative    Johnson Negative    7 Grass Negative    Ragweed mix Negative    Weed mix Negative    Tree mix Negative    Mold 1 Negative    Mold 2 Negative    Mold 3 Negative    Mold 4 Negative    Cat Negative    Dog Negative    Cockroach Negative     Mite mix Negative           Past Medical History: Patient  Active Problem List   Diagnosis Date Noted  . Adverse reaction to food, subsequent encounter 06/23/2020  . Not well controlled severe persistent asthma 06/23/2020  . Heartburn 06/23/2020  . Chronic rhinitis 06/23/2020  . Bruising 06/23/2020  . Abrasion of right ankle 02/13/2017  . Rheumatoid factor positive 09/01/2016  . Leucopenia 09/01/2016  . Autoimmune disease ([place]) 02/14/2016  . Fatigue 02/14/2016  . DDD (degenerative disc disease), lumbar 02/14/2016  . Osteoarthritis of both knees 02/14/2016  . Osteoarthritis of both hands 02/14/2016  . Asthma 02/14/2016  . Migraines 02/14/2016  . Gastritis 02/14/2016  . Vitamin D deficiency 02/14/2016  . 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 High risk medication use 02/14/2016   Past Medical History:  Diagnosis Date  . Arthralgia 02/14/2016  . Asthma   . Autoimmune disease ([place]) 02/14/2016   Positive RF 139 CCP Negative  Positive ANA 1:640 Positive Ro Positive La Elevated ESR 105 - Sjorgen's Disease  . DDD (degenerative disc disease), lumbar 02/14/2016  . Fatigue 02/14/2016  . Gastritis 02/14/2016  . Hot flashes   . Migraines 02/14/2016  . Osteoarthritis of both hands 02/14/2016  . Osteoarthritis of both knees 02/14/2016  . Vitamin D deficiency 02/14/2016   Past Surgical History: Past Surgical History:  Procedure Laterality Date  . BREAST BIOPSY Left   . TUBAL LIGATION     Medication List:  Current Outpatient Medications  Medication Sig Dispense Refill  . albuterol (PROVENTIL HFA;VENTOLIN HFA) 108 (90 Base) MCG/ACT inhaler Inhale 2 puffs into the lungs 2 (two) times daily. For shortness of breath 18 g 0  . fluticasone (FLONASE) 50 MCG/ACT nasal spray Place 2 sprays into both nostrils daily. 16 g 2  . ibuprofen (ADVIL) 800 MG tablet TAKE 1 TABLET(800 MG) BY MOUTH EVERY 8 HOURS AS NEEDED 30 tablet 0  . montelukast (SINGULAIR) 10 MG  tablet TAKE 1 TABLET(10 MG) BY MOUTH EVERY EVENING 30 tablet 5  . omalizumab (XOLAIR) 150 MG/ML prefilled syringe Inject 300 mg into the skin every 14 (fourteen) days. Total dose of 373m every 14 days. 4 mL 11  . omalizumab (XOLAIR) 75 MG/0.5ML prefilled syringe Inject 75 mg into the skin every 14 (fourteen) days. 1 mL 11  . pantoprazole (PROTONIX) 40 MG tablet Take 1 tablet (40 mg total) by mouth 2 (two) times daily. 60 tablet 2  . SYMBICORT 160-4.5 MCG/ACT inhaler INHALE 2 PUFFS INTO THE LUNGS TWICE DAILY 10.2 g 5   No current facility-administered medications for this visit.   Allergies: Allergies  Allergen Reactions  . Strawberry Extract Other (See Comments)    Unknown reaction  . Sulfa Antibiotics Anaphylaxis, Hives and Swelling    Swelling of the face and tongue    Social History: Social History   Socioeconomic History  . Marital status: Married    Spouse name: Not on file  . Number of children: 4  . Years of education: 155 . Highest education level: Not on file  Occupational History  . Occupation: tPharmacist, hospital Tobacco Use  . Smoking status: Former Smoker    Packs/day: 0.50    Years: 2.00    Pack years: 1.00    Types: Cigarettes    Quit date: 05/16/1986    Years since quitting: 34.1  . Smokeless tobacco: Never Used  . Tobacco comment: TEEN AGER  Vaping Use  . Vaping Use: Never used  Substance and Sexual Activity  . Alcohol use: No  . Drug use: No  . Sexual activity:  Not on file  Other Topics Concern  . Not on file  Social History Narrative   Lives with husband in a 2 story home.  Has 4 children.  Teacher.  Education: college.    Social Determinants of Health   Financial Resource Strain: Not on file  Food Insecurity: Not on file  Transportation Needs: Not on file  Physical Activity: Not on file  Stress: Not on file  Social Connections: Not on file   Lives in a house which is built in 1970. Smoking: denies Occupation: Engineering geologist  HistoryFreight forwarder in the house: yes Carpet in the family room: no Carpet in the bedroom: no Heating: heat pump Cooling: heat pump Pet: yes 1 cat x 9 yrs.   Family History: Family History  Problem Relation Age of Onset  . Kidney failure Mother   . Heart failure Mother   . Lupus Mother   . COPD Father   . Hypertension Sister   . Acromegaly Sister   . Breast cancer Paternal Aunt 37   Problem                               Relation Asthma                                   Brother  Eczema                                No  Food allergy                          No  Allergic rhino conjunctivitis     No   Review of Systems  Constitutional: Negative for appetite change, chills, fever and unexpected weight change.  HENT: Positive for rhinorrhea. Negative for congestion.   Eyes: Negative for itching.  Respiratory: Positive for cough, chest tightness, shortness of breath and wheezing.   Cardiovascular: Negative for chest pain.  Gastrointestinal: Negative for abdominal pain.  Genitourinary: Negative for difficulty urinating.  Skin: Negative for rash.  Allergic/Immunologic: Negative for environmental allergies and food allergies.  Neurological: Negative for headaches.   Objective: BP 128/80 (BP Location: Right Arm, Patient Position: Sitting, Cuff Size: Normal)   Pulse 86   Temp 97.6 F (36.4 C) (Temporal)   Resp 17   Ht 5' 10.08" (1.78 m)   Wt 171 lb 12.8 oz (77.9 kg)   LMP 05/22/2012   SpO2 99%   BMI 24.60 kg/m  Body mass index is 24.6 kg/m. Physical Exam Vitals and nursing note reviewed.  Constitutional:      Appearance: Normal appearance. She is well-developed.  HENT:     Head: Normocephalic and atraumatic.     Right Ear: Tympanic membrane and external ear normal.     Left Ear: Tympanic membrane and external ear normal.     Nose: Nose normal.     Mouth/Throat:     Mouth: Mucous membranes are moist.     Pharynx: Oropharynx is clear.  Eyes:      Conjunctiva/sclera: Conjunctivae normal.  Cardiovascular:     Rate and Rhythm: Normal rate and regular rhythm.     Heart sounds: Normal heart sounds. No murmur heard. No friction rub. No gallop.   Pulmonary:  Effort: Pulmonary effort is normal.     Breath sounds: Normal breath sounds. No wheezing, rhonchi or rales.  Musculoskeletal:     Cervical back: Neck supple.  Skin:    General: Skin is warm.     Findings: No rash.  Neurological:     Mental Status: She is alert and oriented to person, place, and time.  Psychiatric:        Behavior: Behavior normal.    The plan was reviewed with the patient/family, and all questions/concerned were addressed.  It was my pleasure to see Savannha today and participate in her care. Please feel free to contact me with any questions or concerns.  Sincerely,  Rexene Alberts, DO Allergy & Immunology  [facility] office: [place] office: [phone]

## 2020-06-23 ENCOUNTER — Encounter: Payer: Self-pay | Admitting: Allergy

## 2020-06-23 DIAGNOSIS — J310 Chronic rhinitis: Secondary | ICD-10-CM | POA: Insufficient documentation

## 2020-06-23 DIAGNOSIS — R12 Heartburn: Secondary | ICD-10-CM | POA: Insufficient documentation

## 2020-06-23 DIAGNOSIS — T148XXA Other injury of unspecified body region, initial encounter: Secondary | ICD-10-CM | POA: Insufficient documentation

## 2020-06-23 DIAGNOSIS — T781XXD Other adverse food reactions, not elsewhere classified, subsequent encounter: Secondary | ICD-10-CM | POA: Insufficient documentation

## 2020-06-23 DIAGNOSIS — J4550 Severe persistent asthma, uncomplicated: Secondary | ICD-10-CM | POA: Insufficient documentation

## 2020-06-23 NOTE — Assessment & Plan Note (Signed)
   Continue Protonix 40mg  daily and nothing to eat/drink for 30 minutes afterwards.  Continue lifestyle and dietary modifications as below.

## 2020-06-23 NOTE — Assessment & Plan Note (Addendum)
Managed by pulmonology and recently started on Xolair 375mg  every 2 weeks. . Continue follow up with pulmonology.  . Daily controller medication(s): Symbicort 2 puffs twice a day with spacer and rinse mouth afterwards. . Continue Xolair injections as per pulmonology.  . Continue Singulair (montelukast) 10mg  daily at night. . May use albuterol rescue inhaler 2 puffs every 4 to 6 hours as needed for shortness of breath, chest tightness, coughing, and wheezing. May use albuterol rescue inhaler 2 puffs 5 to 15 minutes prior to strenuous physical activities. Monitor frequency of use.

## 2020-06-23 NOTE — Assessment & Plan Note (Signed)
Patient concerned if food allergies are making her asthma symptoms worse. Especially noted that bread tends to cause coughing but tolerates other gluten containing products such as pasta without any issues.  Based on clinical history patient does not seem to have any IgE mediated food allergies. She still would like to pursue testing.  Today's skin testing showed: Negative to common foods including gluten.  . Discussed with patient that she is not allergic to gluten. It's most likely the dryness of the bread causing the coughing which may be exacerbated by her underlying Sjogren's.  . No dietary restrictions - may want to avoid dry foods such as bread and crackers if they seem to cause coughing.

## 2020-06-23 NOTE — Assessment & Plan Note (Addendum)
Patient has history of low WBC and low platelets - follows with hematology.  Advised patient to follow up with her hematology regarding the recent bruises which are unusual for her.

## 2020-06-23 NOTE — Assessment & Plan Note (Addendum)
Perennial rhinitis symptoms for 2 years with worsening in the winter.  Tried Flonase, Singulair and Zyrtec with some benefit.  Testing over 10 years ago was positive to grass per patient report.  No prior allergy immunotherapy. Elevated IgE level.   Today's skin testing showed: Negative to indoor/outdoor allergens.   Will check environmental allergy panel via bloodwork as well due to elevated IgE level.   May use Flonase (fluticasone) nasal spray 1 spray per nostril twice a day as needed for nasal congestion.

## 2020-06-24 LAB — ALLERGENS W/TOTAL IGE AREA 2
Alternaria Alternata IgE: 0.12 kU/L — AB
Aspergillus Fumigatus IgE: 0.12 kU/L — AB
Bermuda Grass IgE: <0.1 kU/L
Cat Dander IgE: <0.1 kU/L
Cedar, Mountain IgE: <0.1 kU/L
Cladosporium Herbarum IgE: 0.17 kU/L — AB
Cockroach, German IgE: <0.1 kU/L
Common Silver Birch IgE: <0.1 kU/L
Cottonwood IgE: <0.1 kU/L
D Farinae IgE: 0.49 kU/L — AB
D Pteronyssinus IgE: 0.37 kU/L — AB
Dog Dander IgE: 0.22 kU/L — AB
Elm, American IgE: 0.11 kU/L — AB
IgE (Immunoglobulin E), Serum: 3606 IU/mL — ABNORMAL HIGH (ref 6–495)
Johnson Grass IgE: 0.3 kU/L — AB
Maple/Box Elder IgE: <0.1 kU/L
Mouse Urine IgE: <0.1 kU/L
Oak, White IgE: <0.1 kU/L
Pecan, Hickory IgE: <0.1 kU/L
Penicillium Chrysogen IgE: <0.1 kU/L
Pigweed, Rough IgE: <0.1 kU/L
Ragweed, Short IgE: <0.1 kU/L
Sheep Sorrel IgE Qn: 0.13 kU/L — AB
Timothy Grass IgE: <0.1 kU/L
White Mulberry IgE: <0.1 kU/L

## 2020-06-30 ENCOUNTER — Other Ambulatory Visit: Payer: Self-pay

## 2020-06-30 ENCOUNTER — Ambulatory Visit: Payer: BC Managed Care – PPO | Admitting: Pharmacist

## 2020-06-30 DIAGNOSIS — J4550 Severe persistent asthma, uncomplicated: Secondary | ICD-10-CM

## 2020-06-30 MED ORDER — OMALIZUMAB 150 MG/ML ~~LOC~~ SOSY: PREFILLED_SYRINGE | SUBCUTANEOUS | 5 refills | Status: DC

## 2020-06-30 MED ORDER — OMALIZUMAB 75 MG/0.5ML ~~LOC~~ SOSY: PREFILLED_SYRINGE | SUBCUTANEOUS | 5 refills | Status: DC

## 2020-06-30 NOTE — Progress Notes (Signed)
HPI Ms. Ige and her husband, Jomarie Longs, present today to [facility] to see pharmacy team for 3rd Xolair dose. First dose received on 06/01/20 and second dose received on 06/16/20.  Patient recently had visit with Dr. Selena Batten with asthma/allergy for food triggers with no positive allergens identified.  Today, we discussed in detail that Xolair is an add-on treatment to maintenance inhaler regimen of Symbicort as well as montelukast. Discussed that there is no routing lab monitoring for Xolair but symptomology and changes in exacerbations are assessed.   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  Xolair dose: 375mg  every 14 days  Past medical history includes asthma, autoimmune disease with rheumatoid arthritis, and Sjogren's syndrome.  Respiratory Medications Current: Symbicort, montelukast, Flonase Patient reports no known adherence challenges  OBJECTIVE Allergies  Allergen Reactions   Strawberry Extract Other (See Comments)    Unknown reaction   Sulfa Antibiotics Anaphylaxis, Hives and Swelling    Swelling of the face and tongue     Outpatient Encounter Medications as of 06/30/2020  Medication Sig   albuterol (PROVENTIL HFA;VENTOLIN HFA) 108 (90 Base) MCG/ACT inhaler Inhale 2 puffs into the lungs 2 (two) times daily. For shortness of breath   fluticasone (FLONASE) 50 MCG/ACT nasal spray Place 2 sprays into both nostrils daily.   ibuprofen (ADVIL) 800 MG tablet TAKE 1 TABLET(800 MG) BY MOUTH EVERY 8 HOURS AS NEEDED   montelukast (SINGULAIR) 10 MG tablet TAKE 1 TABLET(10 MG) BY MOUTH EVERY EVENING   omalizumab (XOLAIR) 150 MG/ML prefilled syringe Inject 300 mg into the skin every 14 (fourteen) days. Total dose of 375mg  every 14 days.   omalizumab 07/02/2020) 75 MG/0.5ML prefilled syringe Inject 75 mg into the skin every 14 (fourteen) days.   pantoprazole (PROTONIX) 40 MG tablet Take 1  tablet (40 mg total) by mouth 2 (two) times daily.   SYMBICORT 160-4.5 MCG/ACT inhaler INHALE 2 PUFFS INTO THE LUNGS TWICE DAILY   No facility-administered encounter medications on file as of 06/30/2020.     Immunization History  Administered Date(s) Administered   Influenza,inj,Quad PF,6+ Mos 02/07/2019   Moderna SARS-COV2 Booster Vaccination 04/24/2020   Moderna Sars-Covid-2 Vaccination 07/31/2019, 09/02/2019     PFTs PFT Results Latest Ref Rng & Units 02/04/2020 04/09/2019  FVC-Pre L 3.54 3.49  FVC-Predicted Pre % 101 99  FVC-Post L 3.40 3.60  FVC-Predicted Post % 97 102  Pre FEV1/FVC % % 71 71  Post FEV1/FCV % % 79 76  FEV1-Pre L 2.51 2.47  FEV1-Predicted Pre % 90 88  FEV1-Post L 2.67 2.73  DLCO uncorrected ml/min/mmHg 17.65 17.46  DLCO UNC% % 70 69  DLCO corrected ml/min/mmHg 19.57 -  DLCO COR %Predicted % 78 -  DLVA Predicted % 91 87  TLC L 5.31 5.09  TLC % Predicted % 89 85  RV % Predicted % 73 69     Eosinophils Most recent blood eosinophil count was <100 cells/microL taken on 02/04/20.   IgE: 1726 pm 12/30/19   Assessment   1. Biologics training (Omalizumab - Xolair) o MOA: IgG monoclonal antibody (recombinant DNA derived) which inhibits IgE binding to the high-affinity IgE receptor on mast cells and basophils.  o Response to therapy: 3-6 months is considered adequate trial of medication o Side effects: Anaphylaxis (0.1%) (black boxed warning), injection site reaction (45%), arthralgia (2.9% to 8%), headache (3% to 15%) o Dosing: SubQ every 2-4 weeks based on weight and  pretreatment serum IgE  o Discussed storage in refrigerator. Advised that she can leave medication at room temperature in box for up to 48 hours. Advised to leave at room temperature for at least 30 min prior to administration for comfort. Discussed injection site reaction management.  o Discussed proper disposal of syringes. Has sharps box that she uses for pet. o Administration:  1. Doses  >150 mg should be divided over more than one injection site (eg, 375 mg administered as three injections) 2.  Do not inject into moles, scars, bruises, tender areas, or broken skin. 3. Injections may take 5 to 10 seconds to administer (solution is slightly viscous). 4. Administer only under direct medical supervision and observe patient for 2 hours after the first 3 injections and 30 minutes after subsequent injections or in accordance with individual institution policies and procedures 5. Recommended injection sites include the upper arm and the front and middle of the thighs.  Used medication that was delivered by [facility]. Patient administered one dose in right lower abdomen. Spouse administered two doses in left arm at least 3 inches apart. Though patient had some slight hesitance at first, she successfully self-administered. She reports that her husband or nurse at workplace (school) would be amenable to administering.  Patient provided with dose that is due in 2 weeks to take home - 2 PFS of 150mg  and 1 PFS of 75mg . Drug name: Xolair 150mg /mL PFS Qty: 2 PFS LOT: Exp.Date: 02/2021  Drug name: Xolair 75mg /mL PFS Qty: 1 PFS LOT: Exp.Date: 02/2021  2. Medication Reconciliation  A drug regimen assessment was performed, including review of allergies, interactions, disease-state management, dosing and immunization history. Medications were reviewed with the patient, including name, instructions, indication, goals of therapy, potential side effects, importance of adherence, and safe use.  Patient continues to report slight hesitance with long-term use of Xolair as well as ICS and medications overall. Does not appreciate having many medications - she did mention that she may not stay on Xolair for longer than a year though we discussed it's a long-term medication. We reviewed in detail the difference in side effect profile of inhaled steroids vs oral steroids. Discussed  pharmacokinetic differences in absorption and subsequent long-term effects.  Discussed that Xolair dose is not changed since baseline weight and IgE levels are using for dosing but IgE is not tracked to assess Xolair efficacy.  Patient and spouse verbalized understanding.   Drug interaction(s): none identified  PLAN - Continue Xolair 375mg  every 2 weeks. Patient provided syringes for dose that is due 07/14/20. Prescription for future fills sent to [facility] with note that patient has switched to self-admin. - Continue Symbicort and montelukast as prescribed. - Patient will need f/u with provider in 1-2 months after being on Xolair. Will route to triage team.  All questions encouraged and answered.  Instructed patient to reach out with any further questions or concerns.  Thank you for allowing pharmacy to participate in this patient's care.  , PharmD, MPH Clinical Pharmacist (Rheumatology and Pulmonology)

## 2020-07-13 ENCOUNTER — Telehealth: Payer: Self-pay | Admitting: Pulmonary Disease

## 2020-07-13 NOTE — Telephone Encounter (Signed)
Returned call answered clinical questions to confirm patient is able to self-inject. They are now able to ship to patient.   Called patient and advised. Nothing further needed.

## 2020-08-06 ENCOUNTER — Ambulatory Visit (INDEPENDENT_AMBULATORY_CARE_PROVIDER_SITE_OTHER): Payer: BC Managed Care – PPO | Admitting: Pulmonary Disease

## 2020-08-06 ENCOUNTER — Encounter: Payer: Self-pay | Admitting: Pulmonary Disease

## 2020-08-06 ENCOUNTER — Other Ambulatory Visit: Payer: Self-pay

## 2020-08-06 VITALS — BP 128/80 | HR 77 | Temp 97.3°F | Ht 70.0 in | Wt 171.8 lb

## 2020-08-06 DIAGNOSIS — J4550 Severe persistent asthma, uncomplicated: Secondary | ICD-10-CM | POA: Diagnosis not present

## 2020-08-06 MED ORDER — SPIRIVA RESPIMAT 2.5 MCG/ACT IN AERS: 2.0000 | INHALATION_SPRAY | Freq: Every day | RESPIRATORY_TRACT | 5 refills | Status: DC

## 2020-08-06 NOTE — Progress Notes (Addendum)
Megan Reid    [id number]    [birth year]  Primary Care Physician:Varadarajan, Soyla Murphy, MD  Referring Physician: [name], MD [address] [place],  [place] [address]  Chief complaint:  Follow up for Asthma Megan Reid, November 2021  HPI: 55 year old with history of asthma, autoimmune disease with rheumatoid arthritis, Sjogren's syndrome  Previously followed by Dr. Corliss Skains and rheumatology at [facility].  She was on plaquenil, methotrexate and folic acid, prednisone.  She has not followed up with rheumatology since 2018 and is currently not on treatment.  Last rheumatology note was from [facility] in 2018 where they discussed possibly needing leflunomide or CellCept.  But she has not followed back since the co-pay was high at [facility] She had a high-res CT in 2018 with no evidence of interstitial lung disease.  CTA in December 2019 showed some groundglass opacities at the bases but no follow-up CT was done  Has history of asthma for which she is on Symbicort but is using it 2 puffs once a day.  Also has significant allergic rhinitis, GERD which is untreated at present.  Pets: Has a cat.  No birds, farm animal Occupation: Runner, broadcasting/film/video for fourth grade Exposures: Reports mold exposure at school but currently she is working remotely from home.  She also has a comfortable.  No hot tubs, Jacuzzi's, humidifier Smoking history: Remote smoking history as a teenager Travel history: Previously lived in [place], [place].  She has been in [place] for the past 7 years.  No significant recent travel Relevant family history: Father had emphysema.  He was a smoker.  Interim history: Started on Xolair late last year but with improvement in the frequency of asthma exacerbations continues to have high symptom burden with persistent dyspnea, needing to use rescue medication several times a day She has coughing spells in the morning and evening Continues on PPI but dose  reduced to once a day due to fear of side effects  Follows with Dr. Selena Batten for food allergies  Outpatient Encounter Medications as of 08/06/2020  Medication Sig  . albuterol (PROVENTIL HFA;VENTOLIN HFA) 108 (90 Base) MCG/ACT inhaler Inhale 2 puffs into the lungs 2 (two) times daily. For shortness of breath  . fluticasone (FLONASE) 50 MCG/ACT nasal spray Place 2 sprays into both nostrils daily.  . montelukast (SINGULAIR) 10 MG tablet TAKE 1 TABLET(10 MG) BY MOUTH EVERY EVENING  . omalizumab Geoffry Paradise) 150 MG/ML prefilled syringe Inject 300mg  as two divided doses into the skin every 14 days IN ADDITION to one 75mg  syringe. (TOTAL DOSE: 375mg  every 14 days)  . omalizumab ) 75 MG/0.5ML prefilled syringe Inject 75mg  into the skin every 14 days IN ADDITION to two 150mg  syringes. (TOTAL DOSE: 375mg  every 14 days)  . pantoprazole (PROTONIX) 40 MG tablet Take 1 tablet (40 mg total) by mouth 2 (two) times daily.  . SYMBICORT 160-4.5 MCG/ACT inhaler INHALE 2 PUFFS INTO THE LUNGS TWICE DAILY  . ibuprofen (ADVIL) 800 MG tablet TAKE 1 TABLET(800 MG) BY MOUTH EVERY 8 HOURS AS NEEDED   No facility-administered encounter medications on file as of 08/06/2020.   Physical Exam: Blood pressure 128/80, pulse 77, temperature (!) 97.3 F (36.3 C), temperature source Temporal, height 5\' 10"  (1.778 m), weight 171 lb 12.8 oz (77.9 kg), last menstrual period 05/22/2012, SpO2 100 %. Gen:      No acute distress HEENT:  EOMI, sclera anicteric Neck:     No masses; no thyromegaly Lungs:  Clear to auscultation bilaterally; normal respiratory effort CV:         Regular rate and rhythm; no murmurs Abd:      + bowel sounds; soft, non-tender; no palpable masses, no distension Ext:    No edema; adequate peripheral perfusion Skin:      Warm and dry; no rash Neuro: alert and oriented x 3 Psych: normal mood and affect  Data Reviewed: Imaging: CT high-resolution 09/21/2016-biapical scarring.  No evidence of interstitial lung  disease.  4 mm nodule in the left major fissure. CTA 04/14/2018 no PE, patchy groundglass opacities and reticulation in the mid to lower lungs. Chest x-ray 04/26/2018-no active cardiopulmonary disease CTA 12/05/2019-no PE, subsegmental atelectasis, stable reticular changes in the right middle lobe and right lower lobe.  I have reviewed the images personally.  PFTs  04/09/2019 FVC 3.60 [102%], FEV1 2.73 [97%], F/F 76, TLC 5.09 [5%], DLCO 17.46 [69%]  02/04/2019 FVC 3.40 [97%], FEV1 2.67 [96%], F/F 79, TLC 5.31 [89%], DLCO 17.65 [70%] Minimal obstructive airway disease with minimal diffusion defect  Act score 01/09/2020-10 Act score 03/10/2020-11 Act score 08/06/2020-12  Labs: CBC 10/09/2018-WBC 3.6, hemoglobin 9.5, platelets 129, eos 0% CBC 02/04/2020-WBC 2.2, eos 0% IgE 12/30/2019-1726  Hypersensitivity panel 02/07/2019-negative  Assessment:  Asthma Has worsening asthma control Xolair has made improvement in frequency of exacerbations from 2-3 times/week to once every 2 weeks. But she continues to have symptoms of dyspnea. Will start paperwork for Tezspire.  She does not know if she wants to make the switch yet but wants to be ready with approval  Continue Symbicort, Singulair Add Spiriva  Allergic rhinitis, GERD Has significant issues with rhinitis, GERD Continue Protonix once a day.  Does not want to use twice a day for fear of side effects I will asked her to resume Flonase, she will use chlorpheniramine over-the-counter for postnasal drip  Autoimmune disease, arthritis, Sjogren's syndrome Previously followed by Dr. Corliss Skains, rheumatology and at [facility] rheumatology since she has not been seen since 2019 Exposure history notable for down comforters.  She has gotten rid of them earlier in 2020 No evidence of ILD on high-res CT  Plan/Recommendations: - Continue Symbicort, Singulair, add Spiriva - On Xolair, start paperwork for tezspire - Flonase, chlorpheniramine  Chilton Greathouse MD [facility] 08/06/2020, 4:31 PM  CC: [name],*

## 2020-08-06 NOTE — Patient Instructions (Signed)
We will start medication for new injection called tezspire Continue Xolair for now until we make the decision to make the switch  Start using the Flonase He can use over-the-counter antihistamine such as chlorpheniramine 1 to 2 tablets up to 3 times a day.  Watch out for side effects of daytime sleepiness Continue the antiacid medication Continue Symbicort We will start Spiriva  Follow-up in 3 months

## 2020-08-10 ENCOUNTER — Telehealth: Payer: Self-pay | Admitting: Pharmacy Technician

## 2020-08-10 NOTE — Telephone Encounter (Signed)
Received New start paperwork for Tezspire. Will update as we work through the benefits process.  Faxed forms to [employer] for BIV.

## 2020-08-13 NOTE — Telephone Encounter (Signed)
Called [facility] to check status of referral. Currently still in benefit review. Will follow up.  Phone# [phone]

## 2020-08-17 ENCOUNTER — Other Ambulatory Visit: Payer: Self-pay | Admitting: Pharmacy Technician

## 2020-08-17 NOTE — Telephone Encounter (Signed)
Called patient, she is still debating on the switch. She wants to give Xolair a little longer to see if her breathing gets better through the Spring. 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 She will contact office once she decides if she wants to move forward.  Patient discussed bills she received for in-office Xolair injections. She will look in her email for her portal login for the Xolair copay portal. If she is unable to locate, she will fax to the pharmacy team office to have submitted.  EOB's can be submitted via [email or web address] or by faxing to [phone] (With patient ID on cover letter)  Xolair phone# [phone]

## 2020-08-17 NOTE — Telephone Encounter (Signed)
We will let you guys know once patient reaches back out.

## 2020-08-17 NOTE — Telephone Encounter (Signed)
Called Tezsp[employer] to check status of patient's BIV, benefits investigation has been complete and patient has been enrolled into the Tezspire's quick start program. Prior Berkley Harvey will be required. They will fax over the benefits summary.  1st Tezspire shipment will deliver to clinic tomorrow, 08/18/20.  Routing to infusion team as an FYI- patient will need a therapy plan.   Phone# [phone]

## 2020-09-01 ENCOUNTER — Telehealth: Payer: BC Managed Care – PPO | Admitting: Pharmacist

## 2020-09-01 NOTE — Telephone Encounter (Addendum)
PA renewal for Xolair was denied by [facility] d/t most recent office note from 08/06/20 stating that she has not had improvement while on Xolair - must show improvement: fewer or less severe asthma symptoms and attacks, or reduction in daily steroid dose.  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 Does not have elevated eosinophils to necessarily qualify for Eliberto Ivory, Dupixent  Today she states her last asthma attack was 2 weeks ago which is different from prior to Xolair when she was having asthma attacks 2-3 times weekly  Patient states she is due for next Xolair dose on 09/09/20 and has syringes for this dose but it will be the last of the supply.  Will route to Dr. Isaiah Serge for advisement.  Chesley Mires, PharmD, MPH Clinical Pharmacist (Rheumatology and Pulmonology)

## 2020-09-03 ENCOUNTER — Ambulatory Visit: Payer: BC Managed Care – PPO | Admitting: Allergy

## 2020-09-17 NOTE — Telephone Encounter (Addendum)
Urgent appeal for Xolair faxed to [facility] with addended OV note and note from November 2021 prior to starting Xolair.  Fax: [phone] Phone: [phone]

## 2020-09-17 NOTE — Telephone Encounter (Signed)
Ok. I have addended my note to show that xolair made a difference

## 2020-09-20 ENCOUNTER — Other Ambulatory Visit: Payer: Self-pay | Admitting: Pulmonary Disease

## 2020-09-20 DIAGNOSIS — J4541 Moderate persistent asthma with (acute) exacerbation: Secondary | ICD-10-CM

## 2020-09-20 DIAGNOSIS — R0602 Shortness of breath: Secondary | ICD-10-CM

## 2020-09-22 ENCOUNTER — Other Ambulatory Visit (HOSPITAL_COMMUNITY): Payer: Self-pay

## 2020-09-22 NOTE — Telephone Encounter (Signed)
Spoke with rep at [facility] regarding Xolair appeal, no determination has been made yet and the deadline for decision is 5 calendar days from now. Provided direct fax number for determination to be sent to once it has been made.  Phone# [phone]

## 2020-09-27 ENCOUNTER — Other Ambulatory Visit (HOSPITAL_COMMUNITY): Payer: Self-pay

## 2020-09-27 NOTE — Telephone Encounter (Signed)
Received notification from [facility] regarding a prior authorization appeal for XOLAIR. Denial has been overturned. Xolair has been APPROVED from 09/24/20 to 09/24/21.   Authorization # [id number]  Chesley Mires, PharmD, MPH Clinical Pharmacist (Rheumatology and Pulmonology)

## 2020-09-27 NOTE — Telephone Encounter (Addendum)
ATC patient to advise that she may call [facility] to schedule Xolair shipment. ATC x 3 times but phone does not ring. Reached out to her spouse, Megan Reid, and advised that he can let her know that she should be able to schedule Xolair shipment to home. He verbalized understanding  Called [facility] and they were able to process both Xolair prescriptions for total of $5 copay.  Chesley Mires, PharmD, MPH Clinical Pharmacist (Rheumatology and Pulmonology)

## 2020-10-08 ENCOUNTER — Ambulatory Visit: Payer: BC Managed Care – PPO | Admitting: Allergy

## 2020-10-17 NOTE — Patient Instructions (Addendum)
Adverse reaction to food You are not allergic to gluten  You have no dietary restrictions  Asthma (managed by pulmonary) Continue follow up with pulmonology.  Daily controller medication(s): Symbicort 2 puffs twice a day with spacer and rinse mouth afterwards. Continue Xolair injections as per pulmonology.  May use albuterol rescue inhaler 2 puffs every 4 to 6 hours as needed for shortness of breath, chest tightness, coughing, and wheezing. May use albuterol rescue inhaler 2 puffs 5 to 15 minutes prior to strenuous physical activities. Monitor frequency of use.  Asthma control goals:  Full participation in all desired activities (may need albuterol before activity) Albuterol use two times or less a week on average (not counting use with activity) Cough interfering with sleep two times or less a month Oral steroids no more than once a year No hospitalizations  Reflux Increase Protonix 40mg  twice a day. 30 minutes before breakfast and dinner.  We will see if this helps with your cough Continue lifestyle and dietary modifications   Allergic rhinitis Continue avoidance measures directed towards dust mite, dog ,and grass Continue fluticasone nasal spray 1 spray each nostril twice a day as needed for stuffy nose.In the right nostril, point the applicator out toward the right ear. In the left nostril, point the applicator out toward the left ear.  We will see if using the proper technique for fluticasone nasal spray along with using saline nasal rinse will help with your symptoms and cough. Using a saline nasal rinse once to twice a day as needed for nasal symptoms.  Use this before any nasal sprays Consider sinus imaging if symptoms persist  Bruising( history of low WBC and low platelets) Schedule a follow up with hematology  Schedule an appointment with your primary care physician to discuss your urinary frequency, abdominal pain, the pain in your back, and your headaches  Follow up  in 4 weeks with Dr. or sooner if needed.

## 2020-10-19 ENCOUNTER — Other Ambulatory Visit: Payer: Self-pay

## 2020-10-19 ENCOUNTER — Encounter: Payer: Self-pay | Admitting: Family

## 2020-10-19 ENCOUNTER — Ambulatory Visit (INDEPENDENT_AMBULATORY_CARE_PROVIDER_SITE_OTHER): Payer: BC Managed Care – PPO | Admitting: Family

## 2020-10-19 VITALS — BP 140/80 | HR 85 | Temp 97.5°F | Resp 16 | Ht 70.0 in | Wt 171.2 lb

## 2020-10-19 DIAGNOSIS — T148XXA Other injury of unspecified body region, initial encounter: Secondary | ICD-10-CM

## 2020-10-19 DIAGNOSIS — J3089 Other allergic rhinitis: Secondary | ICD-10-CM

## 2020-10-19 DIAGNOSIS — R12 Heartburn: Secondary | ICD-10-CM

## 2020-10-19 DIAGNOSIS — R059 Cough, unspecified: Secondary | ICD-10-CM | POA: Diagnosis not present

## 2020-10-19 DIAGNOSIS — J4550 Severe persistent asthma, uncomplicated: Secondary | ICD-10-CM

## 2020-10-19 DIAGNOSIS — T781XXD Other adverse food reactions, not elsewhere classified, subsequent encounter: Secondary | ICD-10-CM

## 2020-10-19 DIAGNOSIS — J302 Other seasonal allergic rhinitis: Secondary | ICD-10-CM

## 2020-10-19 MED ORDER — FLUTICASONE PROPIONATE 50 MCG/ACT NA SUSP: NASAL | 5 refills | Status: DC

## 2020-10-19 NOTE — Progress Notes (Addendum)
[address] Debbora Presto [place] [address] Dept: [phone]  FOLLOW UP NOTE  Patient ID: Yarixa Dawes, female    DOB: 01/23/1966  Age: 55 y.o. MRN: [id number] Date of Office Visit: 10/19/2020  Assessment  Chief Complaint: Asthma (When she eats bread she starts coughing and it causes asthma flares. She is sensitive to smells it caused her to have constant coughing. ) and Cough (Constant coughing - feels like she has a rattle in her chest. Mask makes it worse. Has tested for COVID more than once and all test were negative. )  HPI Eddye Beauchaine is a 55 year old female who presents today for follow-up of allergic rhinitis, adverse reaction to food, not well controlled severe persistent asthma, heartburn, and bruising.  She was last seen on June 21, 2020 by Dr. Selena Batten.  She reports that she has been avoiding dry foods such as bread and crackers and this has not made a difference in her cough.  She reports that actually for the past 6 weeks her cough has been worse.  Her cough is reported as productive with clear sputum.  Allergic rhinitis is reported as not well controlled with fluticasone nasal spray 1 spray each nostril twice a day.  She reports a sinus headache over her left eye, clear rhinorrhea with an occasional streak of blood, and nasal congestion.  She denies any sinus tenderness and post nasal drip.  She reports that she does not feel like she has a sinus infection right now.She is able to taste and smell.  Severe persistent asthma is reported as moderately controlled with Xolair 375 mg every 2 weeks, Symbicort 160/4.5 mcg 2 puffs twice a day with spacer, Singulair 10 mg once a day, and albuterol as needed.  She reports that she has not seen pulmonary in approximately 2 months.  She reports a productive cough with clear sputum, shortness of breath and tightness in her chest last Monday that got better after using her nebulizer.  She denies any fever, or chills.  She is using her albuterol  approximately 3 times a month.  Since her last office visit she has not required any systemic steroids or made any trips to the emergency room or urgent care due to breathing problems.  Heartburn is reported as not well controlled with Protonix 40 mg once a day.  She reports that she will have heartburn daily but when she takes the Protonix at night this helps with her symptoms.  She reports that she was taking Protonix twice a day but stopped this approximately 6 months ago.  She reports at the time she was not have any problems with coughing.  She has forgotten to follow-up with hematology due to her bruising, but reports that she will schedule a follow-up appointment .   Drug Allergies:  Allergies  Allergen Reactions   Strawberry Extract Other (See Comments)    Unknown reaction   Sulfa Antibiotics Anaphylaxis, Hives and Swelling    Swelling of the face and tongue     Review of Systems: Review of Systems  Constitutional:  Positive for chills. Negative for fever.       Reports chills since being on iron  HENT:         Reports clear rhinorrhea with an occasional streak of blood.  She also reports nasal congestion and a sinus headache over her left eye.  She denies any sinus tenderness.  Eyes:        Reports itchy dry eyes.  Reports that she  has Sjogren's and uses wetting drops.  Respiratory:  Positive for cough and shortness of breath. Negative for wheezing.        Productive cough with clear sputum that is worse for the past 6 weeks, and shortness of breath and tightness in her chest last Monday that got better with albuterol and her nebulizer.  Cardiovascular:  Negative for chest pain and palpitations.  Gastrointestinal:        Reports heartburn and denies reflux.  She reports as soon as she takes her Protonix this helps with her heartburn symptoms.   Genitourinary:  Positive for frequency.       Reports increase in frequency of urination, cramp on the right side of her abdomen and  abdominal pain.  She has not discussed this with her primary care physician.  Discussed that she should speak with her primary care physician about the symptoms  Skin:  Negative for itching and rash.  Neurological:  Positive for headaches.       Reports sinus headache above her left eye.  And also that she has had a headache for the past 5 days.  Discussed that she should speak with her primary care physician about this.    Physical Exam: BP 140/80   Pulse 85   Temp (!) 97.5 F (36.4 C)   Resp 16   Ht 5\' 10"  (1.778 m)   Wt 171 lb 3.2 oz (77.7 kg)   LMP 05/22/2012   SpO2 100%   BMI 24.56 kg/m    Physical Exam Constitutional:      Appearance: Normal appearance.  HENT:     Head: Normocephalic and atraumatic.     Comments: Pharynx normal, eyes normal, ears normal, nose: Bilateral lower turbinates moderately edematous and slightly erythematous with no drainage noted.  Left nostril greater than right nostril    Right Ear: Tympanic membrane, ear canal and external ear normal.     Left Ear: Tympanic membrane, ear canal and external ear normal.     Mouth/Throat:     Mouth: Mucous membranes are moist.     Pharynx: Oropharynx is clear.  Eyes:     Conjunctiva/sclera: Conjunctivae normal.  Cardiovascular:     Rate and Rhythm: Regular rhythm.     Heart sounds: Normal heart sounds.  Pulmonary:     Effort: Pulmonary effort is normal.     Breath sounds: Normal breath sounds.     Comments: Lungs clear to auscultation Musculoskeletal:     Cervical back: Neck supple.  Skin:    General: Skin is warm.  Neurological:     Mental Status: She is alert and oriented to person, place, and time.  Psychiatric:        Mood and Affect: Mood normal.        Behavior: Behavior normal.        Thought Content: Thought content normal.        Judgment: Judgment normal.    Diagnostics: FVC 2.94 L, FEV1 2.23 L.  Predicted FVC 3.44 L, predicted FEV1 2.71 L.  Spirometry indicates normal respiratory  function.  Post bronchodilator response with 4 puffs of Xopenex shows FVC 3.18 L, FEV1 2.26 L.  Spirometry indicates normal respiratory function with a 1% change in FEV1.  Assessment and Plan: 1. Seasonal and perennial allergic rhinitis   2. Cough   3. Severe persistent asthma without complication   4. Heartburn   5. Bruising   6. Adverse reaction to food, subsequent encounter  Meds ordered this encounter  Medications   fluticasone (FLONASE) 50 MCG/ACT nasal spray    Sig: Apply 2 sprays per nostril daily    Dispense:  16 g    Refill:  5     Patient Instructions  Adverse reaction to food You are not allergic to gluten  You have no dietary restrictions  Asthma (managed by pulmonary) Continue follow up with pulmonology.  Daily controller medication(s): Symbicort 2 puffs twice a day with spacer and rinse mouth afterwards. Continue Xolair injections as per pulmonology.  May use albuterol rescue inhaler 2 puffs every 4 to 6 hours as needed for shortness of breath, chest tightness, coughing, and wheezing. May use albuterol rescue inhaler 2 puffs 5 to 15 minutes prior to strenuous physical activities. Monitor frequency of use.  Asthma control goals:  Full participation in all desired activities (may need albuterol before activity) Albuterol use two times or less a week on average (not counting use with activity) Cough interfering with sleep two times or less a month Oral steroids no more than once a year No hospitalizations  Reflux Increase Protonix 40mg  twice a day. 30 minutes before breakfast and dinner.  We will see if this helps with your cough Continue lifestyle and dietary modifications   Allergic rhinitis Continue avoidance measures directed towards dust mite, dog ,and grass Continue fluticasone nasal spray 1 spray each nostril twice a day as needed for stuffy nose.In the right nostril, point the applicator out toward the right ear. In the left nostril, point the  applicator out toward the left ear.  We will see if using the proper technique for fluticasone nasal spray along with using saline nasal rinse will help with your symptoms and cough. Using a saline nasal rinse once to twice a day as needed for nasal symptoms.  Use this before any nasal sprays Consider sinus imaging if symptoms persist  Bruising( history of low WBC and low platelets) Schedule a follow up with hematology  Schedule an appointment with your primary care physician to discuss your urinary frequency, abdominal pain, and the pain in your back  Follow up in 4 weeks with Dr. or sooner if needed.     Return in about 4 weeks (around 11/16/2020), or if symptoms worsen or fail to improve.    Thank you for the opportunity to care for this patient.  Please do not hesitate to contact me with questions.  01/16/2021, FNP [facility]  I have provided oversight concerning evaluation and treatment of this patient's health issues addressed during today's encounter. I agree with the assessment and therapeutic plan as outlined in the note.   Signed,   [facility], MD,  Allergy and Immunology,  [facility].

## 2020-11-17 ENCOUNTER — Ambulatory Visit: Payer: BC Managed Care – PPO | Admitting: Allergy

## 2020-11-17 DIAGNOSIS — J309 Allergic rhinitis, unspecified: Secondary | ICD-10-CM

## 2020-11-17 NOTE — Progress Notes (Deleted)
Follow Up Note  RE: Megan Reid MRN: [id number] DOB: August 10, 1965 Date of Office Visit: 11/17/2020  Referring provider: [name],* Primary care provider: [name], MD  Chief Complaint: No chief complaint on file.  History of Present Illness: I had the pleasure of seeing Megan Reid for a follow up visit at the [facility] on 11/17/2020. She is a 55 y.o. female, who is being followed for allergic rhinitis, reflux, asthma. Her previous allergy office visit was on 10/19/2020 with Megan Settle FNP. Today is a regular follow up visit.  Adverse reaction to food You are not allergic to gluten You have no dietary restrictions   Asthma (managed by pulmonary) Continue follow up with pulmonology. Daily controller medication(s): Symbicort 2 puffs twice a day with spacer and rinse mouth afterwards. Continue Xolair injections as per pulmonology. May use albuterol rescue inhaler 2 puffs every 4 to 6 hours as needed for shortness of breath, chest tightness, coughing, and wheezing. May use albuterol rescue inhaler 2 puffs 5 to 15 minutes prior to strenuous physical activities. Monitor frequency of use. Asthma control goals: Full participation in all desired activities (may need albuterol before activity) Albuterol use two times or less a week on average (not counting use with activity) Cough interfering with sleep two times or less a month Oral steroids no more than once a year No hospitalizations   Reflux Increase Protonix 40mg  twice a day. 30 minutes before breakfast and dinner.  We will see if this helps with your cough Continue lifestyle and dietary modifications   Allergic rhinitis Continue avoidance measures directed towards dust mite, dog ,and grass Continue fluticasone nasal spray 1 spray each nostril twice a day as needed for stuffy nose.In the right nostril, point the applicator out toward the right ear. In the left nostril, point  the applicator out toward the left ear.  We will see if using the proper technique for fluticasone nasal spray along with using saline nasal rinse will help with your symptoms and cough. Using a saline nasal rinse once to twice a day as needed for nasal symptoms.  Use this before any nasal sprays Consider sinus imaging if symptoms persist   Bruising( history of low WBC and low platelets) Schedule a follow up with hematology   Adverse reaction to food, subsequent encounter Patient concerned if food allergies are making her asthma symptoms worse. Especially noted that bread tends to cause coughing but tolerates other gluten containing products such as pasta without any issues. Based on clinical history patient does not seem to have any IgE mediated food allergies. She still would like to pursue testing. Today's skin testing showed: Negative to common foods including gluten.  Discussed with patient that she is not allergic to gluten. It's most likely the dryness of the bread causing the coughing which may be exacerbated by her underlying Sjogren's. No dietary restrictions - may want to avoid dry foods such as bread and crackers if they seem to cause coughing.   Chronic rhinitis Perennial rhinitis symptoms for 2 years with worsening in the winter.  Tried Flonase, Singulair and Zyrtec with some benefit.  Testing over 10 years ago was positive to grass per patient report.  No prior allergy immunotherapy. Elevated IgE level.  Today's skin testing showed: Negative to indoor/outdoor allergens. Will check environmental allergy panel via bloodwork as well due to elevated IgE level.  May use Flonase (fluticasone) nasal spray 1 spray per nostril twice a day as needed for  nasal congestion.    Not well controlled severe persistent asthma Managed by pulmonology and recently started on Xolair 375mg  every 2 weeks. Continue follow up with pulmonology. Daily controller medication(s): Symbicort 2 puffs  twice a day with spacer and rinse mouth afterwards. Continue Xolair injections as per pulmonology.  Continue Singulair (montelukast) 10mg  daily at night. May use albuterol rescue inhaler 2 puffs every 4 to 6 hours as needed for shortness of breath, chest tightness, coughing, and wheezing. May use albuterol rescue inhaler 2 puffs 5 to 15 minutes prior to strenuous physical activities. Monitor frequency of use.    Heartburn Continue Protonix 40mg  daily and nothing to eat/drink for 30 minutes afterwards. Continue lifestyle and dietary modifications as below.   Bruising Patient has history of low WBC and low platelets - follows with hematology. Advised patient to follow up with her hematology regarding the recent bruises which are unusual for her.     Assessment and Plan: Eri is a 55 y.o. female with: No problem-specific Assessment & Plan notes found for this encounter.  No follow-ups on file.  No orders of the defined types were placed in this encounter.  Lab Orders  No laboratory test(s) ordered today    Diagnostics: Spirometry:  Tracings reviewed. Her effort: {Blank single:19197::"Good reproducible efforts.","It was hard to get consistent efforts and there is a question as to whether this reflects a maximal maneuver.","Poor effort, data can not be interpreted."} FVC: ***L FEV1: ***L, ***% predicted FEV1/FVC ratio: ***% Interpretation: {Blank single:19197::"Spirometry consistent with mild obstructive disease","Spirometry consistent with moderate obstructive disease","Spirometry consistent with severe obstructive disease","Spirometry consistent with possible restrictive disease","Spirometry consistent with mixed obstructive and restrictive disease","Spirometry uninterpretable due to technique","Spirometry consistent with normal pattern","No overt abnormalities noted given today's efforts"}.  Please see scanned spirometry results for details.  Skin Testing: {Blank  single:19197::"Select foods","Environmental allergy panel","Environmental allergy panel and select foods","Food allergy panel","None","Deferred due to recent antihistamines use"}. Positive test to: ***. Negative test to: ***.  Results discussed with patient/family.   Medication List:  Current Outpatient Medications  Medication Sig Dispense Refill   albuterol (PROVENTIL HFA;VENTOLIN HFA) 108 (90 Base) MCG/ACT inhaler Inhale 2 puffs into the lungs 2 (two) times daily. For shortness of breath 18 g 0   fluticasone (FLONASE) 50 MCG/ACT nasal spray Apply 2 sprays per nostril daily 16 g 5   ibuprofen (ADVIL) 800 MG tablet TAKE 1 TABLET(800 MG) BY MOUTH EVERY 8 HOURS AS NEEDED 30 tablet 0   montelukast (SINGULAIR) 10 MG tablet TAKE 1 TABLET(10 MG) BY MOUTH EVERY EVENING 30 tablet 5   omalizumab (XOLAIR) 150 MG/ML prefilled syringe Inject 300mg  as two divided doses into the skin every 14 days IN ADDITION to one 75mg  syringe. (TOTAL DOSE: 375mg  every 14 days) 4 mL 5   omalizumab (XOLAIR) 75 MG/0.5ML prefilled syringe Inject 75mg  into the skin every 14 days IN ADDITION to two 150mg  syringes. (TOTAL DOSE: 375mg  every 14 days) 1 mL 5   pantoprazole (PROTONIX) 40 MG tablet Take 1 tablet (40 mg total) by mouth 2 (two) times daily. 60 tablet 2   SYMBICORT 160-4.5 MCG/ACT inhaler INHALE 2 PUFFS INTO THE LUNGS TWICE DAILY 10.2 g 5   Tiotropium Bromide Monohydrate (SPIRIVA RESPIMAT) 2.5 MCG/ACT AERS Inhale 2 puffs into the lungs daily. 4 g 5   No current facility-administered medications for this visit.   Allergies: Allergies  Allergen Reactions   Strawberry Extract Other (See Comments)    Unknown reaction   Sulfa Antibiotics Anaphylaxis, Hives and Swelling  Swelling of the face and tongue    I reviewed her past medical history, social history, family history, and environmental history and no significant changes have been reported from her previous visit.  Review of Systems  Objective: LMP 05/22/2012   There is no height or weight on file to calculate BMI. Physical Exam Previous notes and tests were reviewed. The plan was reviewed with the patient/family, and all questions/concerned were addressed.  It was my pleasure to see Loreda today and participate in her care. Please feel free to contact me with any questions or concerns.  Sincerely,  Wyline Mood, DO Allergy & Immunology  [facility] office: [phone] [facility] office: [phone]

## 2020-11-18 ENCOUNTER — Other Ambulatory Visit: Payer: Self-pay | Admitting: Internal Medicine

## 2020-11-18 DIAGNOSIS — E2839 Other primary ovarian failure: Secondary | ICD-10-CM

## 2020-11-25 ENCOUNTER — Other Ambulatory Visit: Payer: Self-pay | Admitting: Obstetrics and Gynecology

## 2020-11-25 DIAGNOSIS — Z1231 Encounter for screening mammogram for malignant neoplasm of breast: Secondary | ICD-10-CM

## 2020-11-29 ENCOUNTER — Ambulatory Visit
Admission: RE | Admit: 2020-11-29 | Discharge: 2020-11-29 | Disposition: A | Discharge status: Home / Self Care | Payer: BC Managed Care – PPO | Source: Ambulatory Visit | Attending: Obstetrics and Gynecology | Admitting: Obstetrics and Gynecology

## 2020-11-29 ENCOUNTER — Other Ambulatory Visit: Payer: Self-pay

## 2020-11-29 DIAGNOSIS — Z1231 Encounter for screening mammogram for malignant neoplasm of breast: Secondary | ICD-10-CM

## 2020-11-30 NOTE — Progress Notes (Signed)
Follow Up Note  RE: Megan Reid MRN: [id number] DOB: 1965/06/21 Date of Office Visit: 12/01/2020  Referring provider: [name],* Primary care provider: [name], MD  Chief Complaint: Follow-up  History of Present Illness: I had the pleasure of seeing Megan Reid for a follow up visit at the [facility] on 12/02/2020. She is a 55 y.o. female, who is being followed for asthma, reflux, allergic rhinitis. Her previous allergy office visit was on 10/19/2020 with [name] FNP. Today is a regular follow up visit.   Asthma (managed by pulmonary) Dry coughing is unchanged and persistent throughout the day. Sometimes she heaves afterwards.  Uses albuterol which sometimes helps and sometimes does not help.  Currently on Symbicort 2 puffs twice a day, Spiriva respimat 2.69mcg 2 puffs once a day, Xolair 375mg  injections every 2 weeks. Using albuterol as needed - about 1-2 times per day still.   She is also concerned about the nodules that were seen at her last CT - she has pulmonology appointment coming up.   Still coughing after eating dry foods such as bread and crackers. Patient has Sjogren's which is managed by her ENT in [place].  Reflux Currently taking Protonix 40mg  twice a day with no improvement in her cough.   Allergic rhinitis 1 cat at home.  Currently taking Flonase 1 spray per nostril twice a day. No nosebleeds.  No PND.  Not taking any antihistamines.   Bruising( history of low WBC and low platelets) Patient has not seen hematologist yet.   Others: Noted some sharp pains in the abdominal pain that comes and goes for a few seconds at a time.  No triggers noted.  01/13/2020 CT chest "Lungs/Pleura: No significant interval change in minimal irregular interstitial opacity of the medial segment right middle lobe (series 4, image 222). There is minimal scarring associated with thoracic disc osteophytes of the  medial right lower lobe. There are stable, definitively benign small nodules of the peripheral left upper lobe measuring 4 mm and smaller (e.g. series 4, image 126). No significant air trapping on expiratory phase imaging. No pleural effusion or pneumothorax.  1. No significant interval change in minimal irregular interstitial opacity of the medial segment right middle lobe. There is minimal scarring associated with thoracic disc osteophytes of the medial right lower lobe. There are no specific features of fibrotic interstitial lung disease. Continued attention on follow-up if there is high clinical suspicion for fibrotic interstitial lung disease. 2. No significant air trapping on expiratory phase imaging. 3. Aortic Atherosclerosis (ICD10-I70.0)."   Assessment and Plan: Megan Reid is a 55 y.o. female with: Chronic coughing Chronic coughing is unchanged - persistent throughout the day. Medical history significant for Sjogren's, asthma, reflux, and allergic rhinitis.  The most common causes of chronic cough include the following: upper airway cough syndrome (UACS) which is caused by variety of rhinitis conditions; asthma; gastroesophageal reflux disease (GERD); chronic bronchitis from cigarette smoking or other inhaled environmental irritants; non-asthmatic eosinophilic bronchitis; and bronchiectasis.  In this case, she most likely has multifactorial causes - I wonder if her Sjogren's may be exacerbating this as well.  Follow up with pulmonologist as scheduled. Reviewed 2021 CT chest with patient as she was concerned about the nodules which seem to be benign.  Follow up with ENT - to look at vocal cords. If coughing unchanged and no underlying issue is found may benefit from speech therapy for chronic cough.  Asthma Managed by pulmonology and on  Xolair 375mg  every 2 weeks. Continue follow up with pulmonology. Daily controller medication(s): Symbicort 2 puffs twice a day with spacer  and rinse mouth afterwards. Continue Spiriva 2.15mcg 2 puffs once a day. Continue Xolair injections as per pulmonology.  May use albuterol rescue inhaler 2 puffs every 4 to 6 hours as needed for shortness of breath, chest tightness, coughing, and wheezing. May use albuterol rescue inhaler 2 puffs 5 to 15 minutes prior to strenuous physical activities. Monitor frequency of use.   Seasonal and perennial allergic rhinitis Past history - Perennial rhinitis symptoms for 2 years with worsening in the winter.  Tried Flonase, Singulair and Zyrtec with some benefit.  Testing over 10 years ago was positive to grass per patient report.  No prior allergy immunotherapy. Elevated IgE level - repeat higher but drawn while on Xolair. 2022 skin testing showed: Negative to indoor/outdoor allergens. 2022 bloodwork positive to dust mites. Interim history - stable, denies PND.  Use Flonase (fluticasone) nasal spray 1 spray per nostril twice a day as needed for nasal congestion.  Start environmental control measures for dust mites.  Heartburn No change in coughing with Protonix 40mg  BID dose. DECREASE Protonix 40mg  to once a day and nothing to eat/drink for 30 minutes afterwards. Continue lifestyle and dietary modifications as below.  Leukopenia Past history - history of low WBC and low platelets. Interim history - has not seen hematology recently. Noted some bruising. Follow up with hematology/oncology.  Return in about 6 months (around 06/03/2021).  No orders of the defined types were placed in this encounter.  Lab Orders  No laboratory test(s) ordered today    Diagnostics: None.  Medication List:  Current Outpatient Medications  Medication Sig Dispense Refill   albuterol (PROVENTIL HFA;VENTOLIN HFA) 108 (90 Base) MCG/ACT inhaler Inhale 2 puffs into the lungs 2 (two) times daily. For shortness of breath 18 g 0   fluticasone (FLONASE) 50 MCG/ACT nasal spray Apply 2 sprays per nostril daily 16 g 5    montelukast (SINGULAIR) 10 MG tablet TAKE 1 TABLET(10 MG) BY MOUTH EVERY EVENING 30 tablet 5   omalizumab (XOLAIR) 150 MG/ML prefilled syringe Inject 300mg  as two divided doses into the skin every 14 days IN ADDITION to one 75mg  syringe. (TOTAL DOSE: 375mg  every 14 days) 4 mL 5   omalizumab (XOLAIR) 75 MG/0.5ML prefilled syringe Inject 75mg  into the skin every 14 days IN ADDITION to two 150mg  syringes. (TOTAL DOSE: 375mg  every 14 days) 1 mL 5   pantoprazole (PROTONIX) 40 MG tablet Take 1 tablet (40 mg total) by mouth 2 (two) times daily. 60 tablet 2   SYMBICORT 160-4.5 MCG/ACT inhaler INHALE 2 PUFFS INTO THE LUNGS TWICE DAILY 10.2 g 5   Tiotropium Bromide Monohydrate (SPIRIVA RESPIMAT) 2.5 MCG/ACT AERS Inhale 2 puffs into the lungs daily. 4 g 5   No current facility-administered medications for this visit.   Allergies: Allergies  Allergen Reactions   Strawberry Extract Other (See Comments)    Unknown reaction   Sulfa Antibiotics Anaphylaxis, Hives and Swelling    Swelling of the face and tongue    I reviewed her past medical history, social history, family history, and environmental history and no significant changes have been reported from her previous visit.  Review of Systems  Constitutional:  Positive for chills. Negative for appetite change, fever and unexpected weight change.  HENT:  Positive for rhinorrhea. Negative for congestion.   Eyes:  Negative for itching.  Respiratory:  Positive for cough. Negative for  chest tightness, shortness of breath and wheezing.   Gastrointestinal:  Positive for abdominal pain.  Skin:  Negative for rash.  Neurological:  Negative for headaches.   Objective: BP 130/82   Pulse 67   Temp 98.2 F (36.8 C) (Temporal)   Ht 5\' 10"  (1.778 m)   Wt 172 lb 4 oz (78.1 kg)   LMP 05/22/2012   SpO2 98%   BMI 24.72 kg/m  Body mass index is 24.72 kg/m. Physical Exam Vitals and nursing note reviewed.  Constitutional:      Appearance: Normal appearance.  She is well-developed.  HENT:     Head: Normocephalic and atraumatic.     Right Ear: Tympanic membrane and external ear normal.     Left Ear: Tympanic membrane and external ear normal.     Nose: Nose normal.     Mouth/Throat:     Mouth: Mucous membranes are dry.     Pharynx: Oropharynx is clear.  Eyes:     Conjunctiva/sclera: Conjunctivae normal.  Cardiovascular:     Rate and Rhythm: Normal rate and regular rhythm.     Heart sounds: Normal heart sounds. No murmur heard. Pulmonary:     Effort: Pulmonary effort is normal.     Breath sounds: Normal breath sounds. No wheezing, rhonchi or rales.  Musculoskeletal:     Cervical back: Neck supple.  Skin:    General: Skin is warm.     Findings: No rash.  Neurological:     Mental Status: She is alert and oriented to person, place, and time.  Psychiatric:        Behavior: Behavior normal.   Previous notes and tests were reviewed. The plan was reviewed with the patient/family, and all questions/concerned were addressed.  It was my pleasure to see Megan Reid today and participate in her care. Please feel free to contact me with any questions or concerns.  Sincerely,  Wyline Mood, DO Allergy & Immunology  [facility] office: [phone] [facility] office: [phone]

## 2020-12-01 ENCOUNTER — Encounter: Payer: Self-pay | Admitting: Allergy

## 2020-12-01 ENCOUNTER — Other Ambulatory Visit: Payer: Self-pay

## 2020-12-01 ENCOUNTER — Ambulatory Visit (INDEPENDENT_AMBULATORY_CARE_PROVIDER_SITE_OTHER): Payer: BC Managed Care – PPO | Admitting: Allergy

## 2020-12-01 VITALS — BP 130/82 | HR 67 | Temp 98.2°F | Ht 70.0 in | Wt 172.2 lb

## 2020-12-01 DIAGNOSIS — J3089 Other allergic rhinitis: Secondary | ICD-10-CM

## 2020-12-01 DIAGNOSIS — J4550 Severe persistent asthma, uncomplicated: Secondary | ICD-10-CM | POA: Diagnosis not present

## 2020-12-01 DIAGNOSIS — D72819 Decreased white blood cell count, unspecified: Secondary | ICD-10-CM

## 2020-12-01 DIAGNOSIS — J302 Other seasonal allergic rhinitis: Secondary | ICD-10-CM

## 2020-12-01 DIAGNOSIS — R12 Heartburn: Secondary | ICD-10-CM

## 2020-12-01 DIAGNOSIS — R053 Chronic cough: Secondary | ICD-10-CM

## 2020-12-01 DIAGNOSIS — M3500 Sicca syndrome, unspecified: Secondary | ICD-10-CM

## 2020-12-01 NOTE — Patient Instructions (Addendum)
Coughing  Not sure what's causing your coughing.   Follow up with pulmonologist as scheduled. Follow up with your ENT - see if they can look at your vocal cords.  Asthma: Continue follow up with pulmonology. Daily controller medication(s): Symbicort 2 puffs twice a day with spacer and rinse mouth afterwards. Continue Spiriva 2.30mcg 2 puffs once a day. Continue Xolair injections as per pulmonology.  May use albuterol rescue inhaler 2 puffs every 4 to 6 hours as needed for shortness of breath, chest tightness, coughing, and wheezing. May use albuterol rescue inhaler 2 puffs 5 to 15 minutes prior to strenuous physical activities. Monitor frequency of use.  Asthma control goals:  Full participation in all desired activities (may need albuterol before activity) Albuterol use two times or less a week on average (not counting use with activity) Cough interfering with sleep two times or less a month Oral steroids no more than once a year No hospitalizations  Reflux: DECREASE Protonix 40mg  once a day and nothing to eat/drink for 30 minutes afterwards. Continue lifestyle and dietary modifications as below.  Rhinitis: Use Flonase (fluticasone) nasal spray 1 spray per nostril twice a day as needed for nasal congestion.  Start environmental control measures for dust mites.  Low white count Please follow up with your hematology/oncology.  Follow up in 6 months or sooner if needed.   Control of House Dust Mite Allergen Dust mite allergens are a common trigger of allergy and asthma symptoms. While they can be found throughout the house, these microscopic creatures thrive in warm, humid environments such as bedding, upholstered furniture and carpeting. Because so much time is spent in the bedroom, it is essential to reduce mite levels there.  Encase pillows, mattresses, and box springs in special allergen-proof fabric covers or airtight, zippered plastic covers.  Bedding should be washed  weekly in hot water (130 F) and dried in a hot dryer. Allergen-proof covers are available for comforters and pillows that can't be regularly washed.  Wash the allergy-proof covers every few months. Minimize clutter in the bedroom. Keep pets out of the bedroom.  Keep humidity less than 50% by using a dehumidifier or air conditioning. You can buy a humidity measuring device called a hygrometer to monitor this.  If possible, replace carpets with hardwood, linoleum, or washable area rugs. If that's not possible, vacuum frequently with a vacuum that has a HEPA filter. Remove all upholstered furniture and non-washable window drapes from the bedroom. Remove all non-washable stuffed toys from the bedroom.  Wash stuffed toys weekly.  Pet Allergen Avoidance: Contrary to popular opinion, there are no "hypoallergenic" breeds of dogs or cats. That is because people are not allergic to an animal's hair, but to an allergen found in the animal's saliva, dander (dead skin flakes) or urine. Pet allergy symptoms typically occur within minutes. For some people, symptoms can build up and become most severe 8 to 12 hours after contact with the animal. People with severe allergies can experience reactions in public places if dander has been transported on the pet owners' clothing. Keeping an animal outdoors is only a partial solution, since homes with pets in the yard still have higher concentrations of animal allergens. Before getting a pet, ask your allergist to determine if you are allergic to animals. If your pet is already considered part of your family, try to minimize contact and keep the pet out of the bedroom and other rooms where you spend a great deal of time. As with dust  mites, vacuum carpets often or replace carpet with a hardwood floor, tile or linoleum. High-efficiency particulate air (HEPA) cleaners can reduce allergen levels over time. While dander and saliva are the source of cat and dog allergens, urine  is the source of allergens from rabbits, hamsters, mice and [place] pigs; so ask a non-allergic family member to clean the animal's cage. If you have a pet allergy, talk to your allergist about the potential for allergy immunotherapy (allergy shots). This strategy can often provide long-term relief.

## 2020-12-02 ENCOUNTER — Encounter: Payer: Self-pay | Admitting: Allergy

## 2020-12-02 DIAGNOSIS — J302 Other seasonal allergic rhinitis: Secondary | ICD-10-CM | POA: Insufficient documentation

## 2020-12-02 DIAGNOSIS — D72819 Decreased white blood cell count, unspecified: Secondary | ICD-10-CM | POA: Insufficient documentation

## 2020-12-02 DIAGNOSIS — R053 Chronic cough: Secondary | ICD-10-CM | POA: Insufficient documentation

## 2020-12-02 DIAGNOSIS — J3089 Other allergic rhinitis: Secondary | ICD-10-CM | POA: Insufficient documentation

## 2020-12-02 DIAGNOSIS — M3500 Sicca syndrome, unspecified: Secondary | ICD-10-CM | POA: Insufficient documentation

## 2020-12-02 NOTE — Assessment & Plan Note (Signed)
Past history - history of low WBC and low platelets. Interim history - has not seen hematology recently. Noted some bruising.  Follow up with hematology/oncology.

## 2020-12-02 NOTE — Assessment & Plan Note (Signed)
No change in coughing with Protonix 40mg  BID dose.  DECREASE Protonix 40mg  to once a day and nothing to eat/drink for 30 minutes afterwards.  Continue lifestyle and dietary modifications as below.

## 2020-12-02 NOTE — Assessment & Plan Note (Addendum)
Chronic coughing is unchanged - persistent throughout the day. Medical history significant for Sjogren's, asthma, reflux, and allergic rhinitis.  . The most common causes of chronic cough include the following: upper airway cough syndrome (UACS) which is caused by variety of rhinitis conditions; asthma; gastroesophageal reflux disease (GERD); chronic bronchitis from cigarette smoking or other inhaled environmental irritants; non-asthmatic eosinophilic bronchitis; and bronchiectasis.  . In this case, she most likely has multifactorial causes - I wonder if her Sjogren's may be exacerbating this as well.  . Follow up with pulmonologist as scheduled. o Reviewed 2021 CT chest with patient as she was concerned about the nodules which seem to be benign.  . Follow up with ENT - to look at vocal cords. . If coughing unchanged and no underlying issue is found may benefit from speech therapy for chronic cough.

## 2020-12-02 NOTE — Assessment & Plan Note (Signed)
Managed by pulmonology and on Xolair 375mg  every 2 weeks. . Continue follow up with pulmonology. . Daily controller medication(s): Symbicort 2 puffs twice a day with spacer and rinse mouth afterwards. o Continue Spiriva 2.70mcg 2 puffs once a day. . Continue Xolair injections as per pulmonology.  . May use albuterol rescue inhaler 2 puffs every 4 to 6 hours as needed for shortness of breath, chest tightness, coughing, and wheezing. May use albuterol rescue inhaler 2 puffs 5 to 15 minutes prior to strenuous physical activities. Monitor frequency of use.

## 2020-12-02 NOTE — Assessment & Plan Note (Addendum)
Past history - Perennial rhinitis symptoms for 2 years with worsening in the winter.  Tried Flonase, Singulair and Zyrtec with some benefit.  Testing over 10 years ago was positive to grass per patient report.  No prior allergy immunotherapy. Elevated IgE level - repeat higher but drawn while on Xolair. 2022 skin testing showed: Negative to indoor/outdoor allergens. 2022 bloodwork positive to dust mites. Interim history - stable, denies PND.  Marland Kitchen Use Flonase (fluticasone) nasal spray 1 spray per nostril twice a day as needed for nasal congestion.  . Start environmental control measures for dust mites.

## 2020-12-03 ENCOUNTER — Encounter: Payer: Self-pay | Admitting: Pulmonary Disease

## 2020-12-03 ENCOUNTER — Ambulatory Visit (INDEPENDENT_AMBULATORY_CARE_PROVIDER_SITE_OTHER): Payer: BC Managed Care – PPO | Admitting: Pulmonary Disease

## 2020-12-03 ENCOUNTER — Other Ambulatory Visit: Payer: Self-pay

## 2020-12-03 VITALS — BP 122/66 | HR 87 | Ht 70.0 in | Wt 173.0 lb

## 2020-12-03 DIAGNOSIS — J4550 Severe persistent asthma, uncomplicated: Secondary | ICD-10-CM | POA: Diagnosis not present

## 2020-12-03 DIAGNOSIS — R0602 Shortness of breath: Secondary | ICD-10-CM

## 2020-12-03 NOTE — Patient Instructions (Signed)
Reorder high-resolution CT for evaluation of your lungs Continue the inhalers and Xolair as prescribed Please keep appointment with ENT for further evaluation of cough Follow-up in 6 months

## 2020-12-03 NOTE — Progress Notes (Signed)
Megan Reid    [id number]    04/11/1966  Primary Care Physician:Varadarajan, Soyla Murphy, MD  Referring Physician: [name], MD [address] [place],  [place] [address]  Chief complaint:  Follow up for Asthma Megan Reid, November 2021  HPI: 55 year old with history of asthma, autoimmune disease with rheumatoid arthritis, Sjogren's syndrome  Previously followed by Dr. Corliss Reid and rheumatology at [facility].  She was on plaquenil, methotrexate and folic acid, prednisone.  She has not followed up with rheumatology since 2018 and is currently not on treatment.  Last rheumatology note was from [facility] in 2018 where they discussed possibly needing leflunomide or CellCept.  But she has not followed back since the co-pay was high at [facility] She had a high-res CT in 2018 with no evidence of interstitial lung disease.  CTA in December 2019 showed some groundglass opacities at the bases but no follow-up CT was done  Has history of asthma for which she is on Symbicort but is using it 2 puffs once a day.  Also has significant allergic rhinitis, GERD Started on Xolair in 2021 but with improvement in the frequency of asthma exacerbations continues to have high symptom burden with persistent dyspnea, needing to use rescue medication several times a day and persistent cough We discussed changing Biologics to tezspire but she is reluctant to make the switch  Pets: Has a cat.  No birds, farm animal Occupation: Runner, broadcasting/film/video for fourth grade Exposures: Reports mold exposure at school but currently she is working remotely from home.  She also has a comfortable.  No hot tubs, Jacuzzi's, humidifier Smoking history: Remote smoking history as a teenager Travel history: Previously lived in [place], [place].  She has been in [place] for the past 7 years.  No significant recent travel Relevant family history: Father had emphysema.  He was a smoker.  Interim history: Continues  on Xolair, inhalers, Singulair States that breathing is stable but continues to have coughing spells in the morning and evening We had discussed changing  She is on PPI Prilosec 40 mg twice daily which she not make a significant difference in cough and is back down to once a day  Follows with Dr. Selena Reid for food allergies  Outpatient Encounter Medications as of 12/03/2020  Medication Sig   albuterol (PROVENTIL HFA;VENTOLIN HFA) 108 (90 Base) MCG/ACT inhaler Inhale 2 puffs into the lungs 2 (two) times daily. For shortness of breath   fluticasone (FLONASE) 50 MCG/ACT nasal spray Apply 2 sprays per nostril daily   montelukast (SINGULAIR) 10 MG tablet TAKE 1 TABLET(10 MG) BY MOUTH EVERY EVENING   omalizumab (XOLAIR) 150 MG/ML prefilled syringe Inject 300mg  as two divided doses into the skin every 14 days IN ADDITION to one 75mg  syringe. (TOTAL DOSE: 375mg  every 14 days)   omalizumab ) 75 MG/0.5ML prefilled syringe Inject 75mg  into the skin every 14 days IN ADDITION to two 150mg  syringes. (TOTAL DOSE: 375mg  every 14 days)   pantoprazole (PROTONIX) 40 MG tablet Take 1 tablet (40 mg total) by mouth 2 (two) times daily. (Patient taking differently: Take 40 mg by mouth daily.)   SYMBICORT 160-4.5 MCG/ACT inhaler INHALE 2 PUFFS INTO THE LUNGS TWICE DAILY   Tiotropium Bromide Monohydrate (SPIRIVA RESPIMAT) 2.5 MCG/ACT AERS Inhale 2 puffs into the lungs daily.   No facility-administered encounter medications on file as of 12/03/2020.   Physical Exam: Blood pressure 122/66, pulse 87, height 5\' 10"  (1.778 m), weight 173  lb (78.5 kg), last menstrual period 05/22/2012, SpO2 98 %. Gen:      No acute distress HEENT:  EOMI, sclera anicteric Neck:     No masses; no thyromegaly Lungs:    Clear to auscultation bilaterally; normal respiratory effort CV:         Regular rate and rhythm; no murmurs Abd:      + bowel sounds; soft, non-tender; no palpable masses, no distension Ext:    No edema; adequate  peripheral perfusion Skin:      Warm and dry; no rash Neuro: alert and oriented x 3 Psych: normal mood and affect   Data Reviewed: Imaging: CT high-resolution 09/21/2016-biapical scarring.  No evidence of interstitial lung disease.  4 mm nodule in the left major fissure. CTA 04/14/2018 no PE, patchy groundglass opacities and reticulation in the mid to lower lungs. Chest x-ray 04/26/2018-no active cardiopulmonary disease CTA 12/05/2019-no PE, subsegmental atelectasis, stable reticular changes in the right middle lobe and right lower lobe.  CT high-resolution 01/14/2020-interstitial opacity in the right middle lobe, minimal scarring.  No evidence of ILD I have reviewed the images personally.  PFTs  04/09/2019 FVC 3.60 [102%], FEV1 2.73 [97%], F/F 76, TLC 5.09 [5%], DLCO 17.46 [69%]  02/04/2019 FVC 3.40 [97%], FEV1 2.67 [96%], F/F 79, TLC 5.31 [89%], DLCO 17.65 [70%] Minimal obstructive airway disease with minimal diffusion defect  02/04/2020 FVC 3.40 [19%], FEV1 2.67 [96%], F/F 79, TLC 5.31 [9%], DLCO 17.65 [90%], Minimal obstruction, minimal diffusion defect  ACT score 01/09/2020-10 ACT score 03/10/2020-11 ACT score 08/06/2020-12 ACT score 12/03/20- 15  Labs: CBC 10/09/2018-WBC 3.6, hemoglobin 9.5, platelets 129, eos 0% CBC 02/04/2020-WBC 2.2, eos 0% IgE 12/30/2019-1726  Hypersensitivity panel 02/07/2019-negative  Assessment:  Asthma Xolair has made improvement in frequency of exacerbations. But she continues to have symptoms of chronic cough, occasional dyspnea She is not interested in making the switch to [employer].    Continue Symbicort, Singulair and Spiriva  Allergic rhinitis, GERD Has significant issues with rhinitis, GERD Continue Protonix once a day.  Does not want to use twice a day for fear of side effects I will asked her to resume Flonase, she will use chlorpheniramine over-the-counter for postnasal drip  She has been treated already referred by allergy for ENT  referral to evaluate chronic cough  Autoimmune disease, arthritis, Sjogren's syndrome Previously followed by Dr. Corliss Reid, rheumatology and at [facility] rheumatology since she has not been seen since 2019 Exposure history notable for down comforters.  She has gotten rid of them earlier in 2020 No evidence of ILD on high-res CT Order repeat high-res CT for monitoring  Plan/Recommendations: - Continue inhalers, Singulair, Xolair - High-res CT - Agree with ENT referral  Chilton Greathouse MD [facility] 12/03/2020, 4:14 PM  CC: Megan Reid,*

## 2020-12-13 ENCOUNTER — Other Ambulatory Visit: Payer: BC Managed Care – PPO

## 2020-12-15 ENCOUNTER — Other Ambulatory Visit: Payer: Self-pay

## 2020-12-15 ENCOUNTER — Ambulatory Visit (INDEPENDENT_AMBULATORY_CARE_PROVIDER_SITE_OTHER)
Admission: RE | Admit: 2020-12-15 | Discharge: 2020-12-15 | Disposition: A | Discharge status: Home / Self Care | Payer: BC Managed Care – PPO | Source: Ambulatory Visit | Attending: Pulmonary Disease | Admitting: Pulmonary Disease

## 2020-12-15 DIAGNOSIS — R0602 Shortness of breath: Secondary | ICD-10-CM | POA: Diagnosis not present

## 2020-12-15 DIAGNOSIS — J4550 Severe persistent asthma, uncomplicated: Secondary | ICD-10-CM

## 2020-12-24 ENCOUNTER — Other Ambulatory Visit: Payer: Self-pay | Admitting: Pulmonary Disease

## 2020-12-24 DIAGNOSIS — J4550 Severe persistent asthma, uncomplicated: Secondary | ICD-10-CM

## 2020-12-28 IMAGING — CT CT ANGIO CHEST
2 of 6 series · 14 of 36 positions shown · IV contrast (OMNIPAQUE 350)
Comparison: CT 02/11/2019
COMPARISON: CT 02/11/2019

Addendum:
CLINICAL DATA: PE suspected, left pleural effusion, history of
Towler disease.

EXAM:
CT ANGIOGRAPHY CHEST WITH CONTRAST
TECHNIQUE: Multidetector CT imaging of the chest was performed using the
standard protocol during bolus administration of intravenous
contrast. Multiplanar CT image reconstructions and MIPs were
obtained to evaluate the vascular anatomy.
CONTRAST:  100mL OMNIPAQUE IOHEXOL 350 MG/ML SOLN

[Series 5: thins · axial · 0.62mm/px · z∈[-78,+133]mm · 13 of 239 slices shown]
[im 14/239  lung]
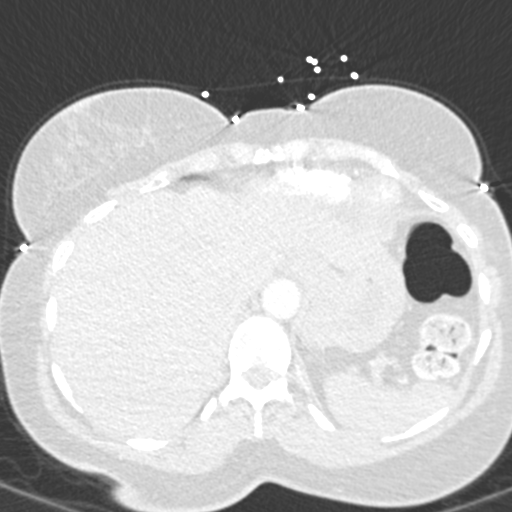
[im 27/239  mediastinal]
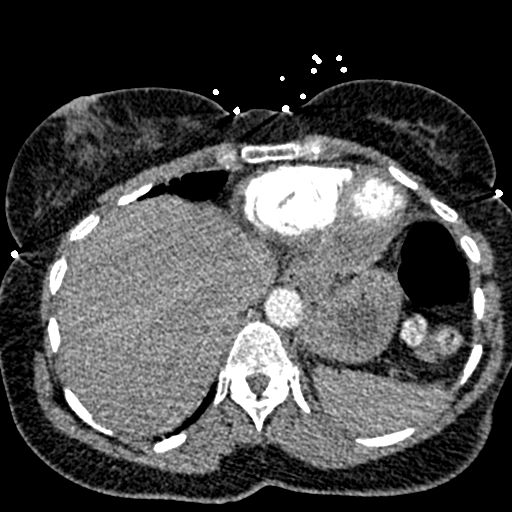
[im 53/239  lung]
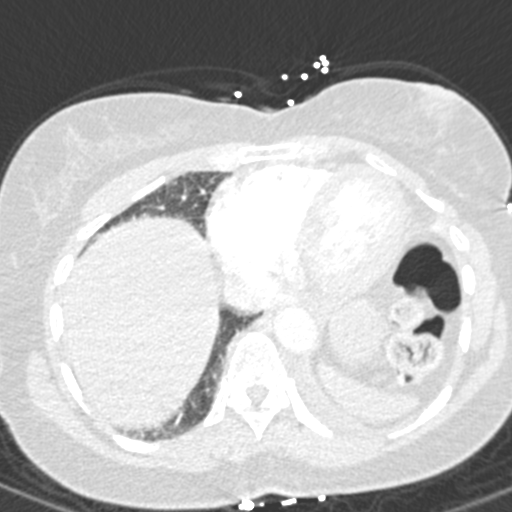
[im 67/239  mediastinal]
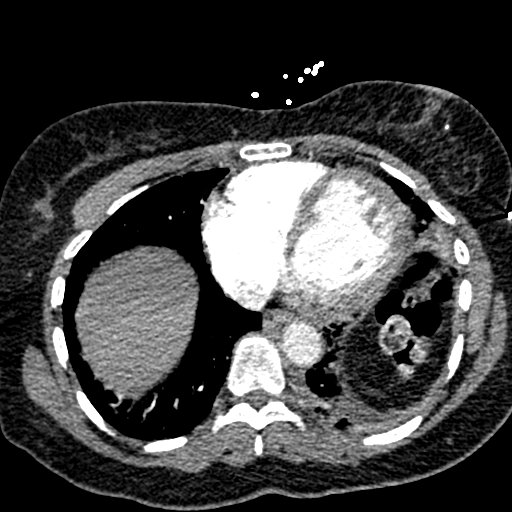
[im 80/239  lung]
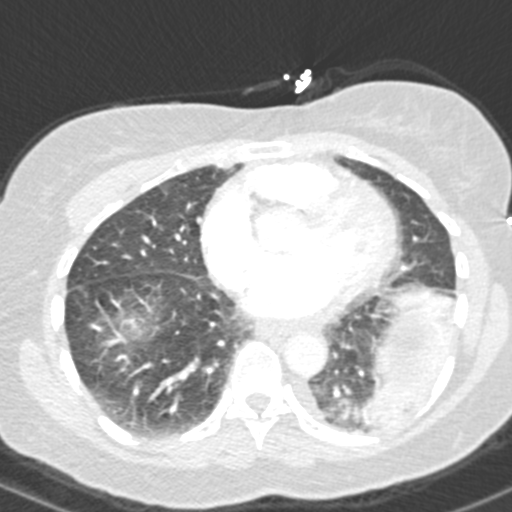
[im 106/239  mediastinal]
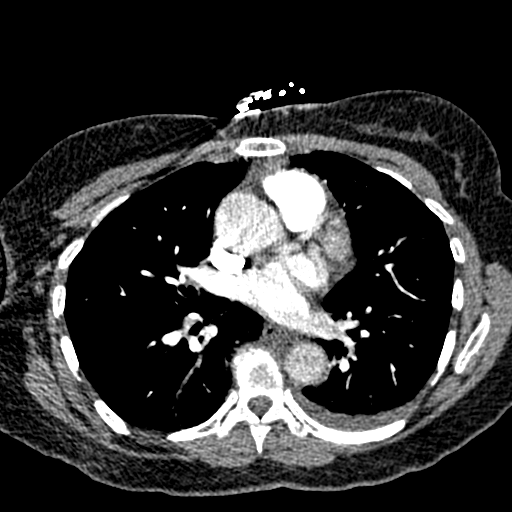
[im 120/239  lung]
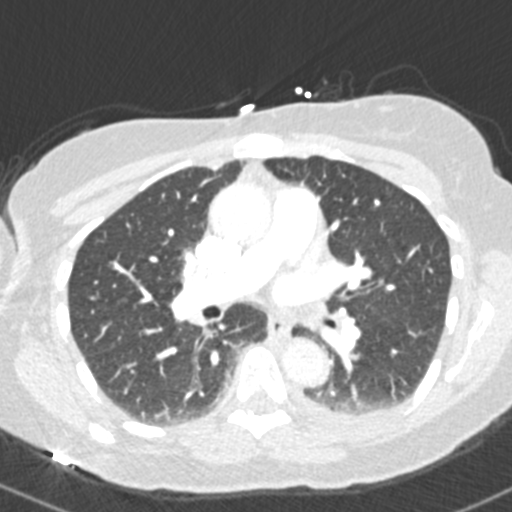
[im 133/239  mediastinal]
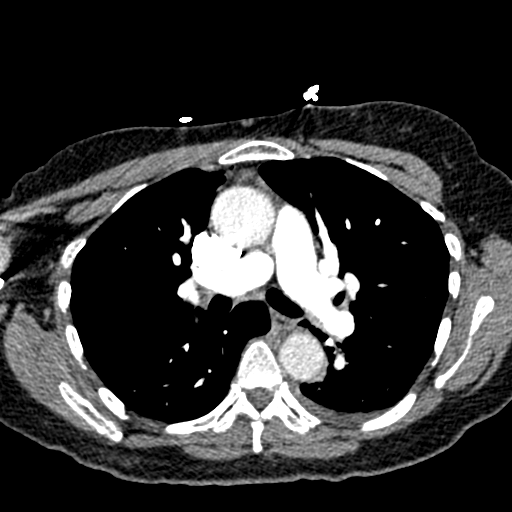
[im 159/239  lung]
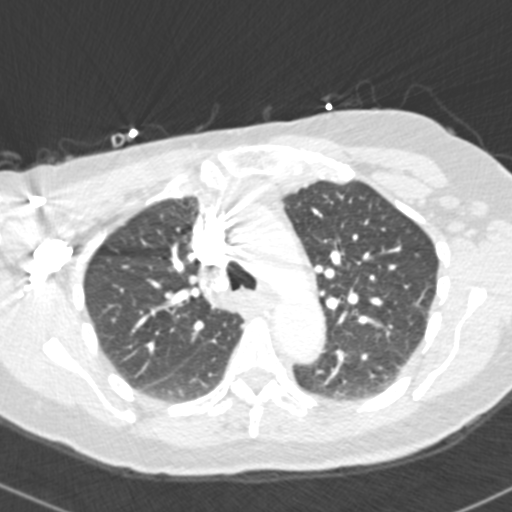
[im 172/239  mediastinal]
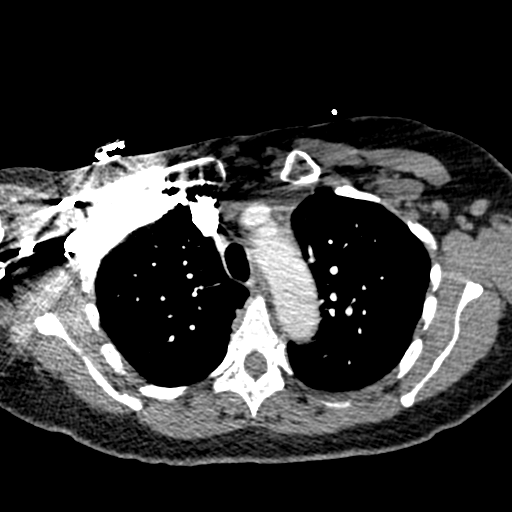
[im 186/239  lung]
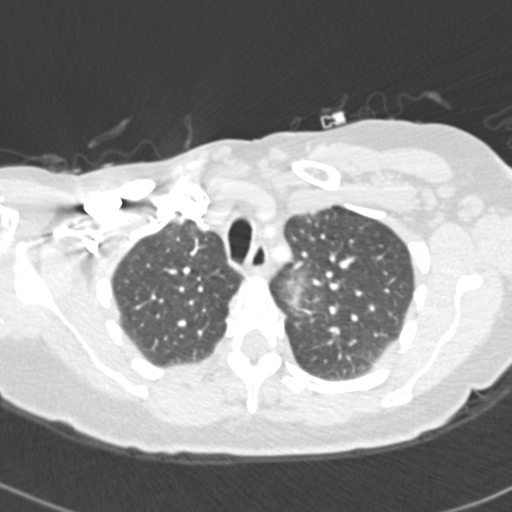
[im 212/239  mediastinal]
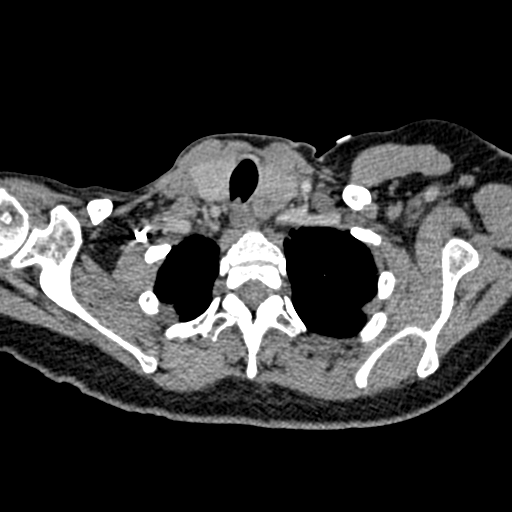
[im 225/239  lung]
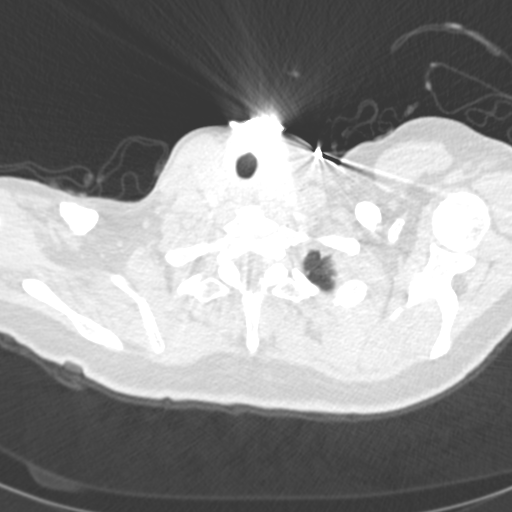

[Series 7: coronal mpr · coronal · 0.46mm/px · 1 of 116 slices shown]
[im 58/116  mediastinal]
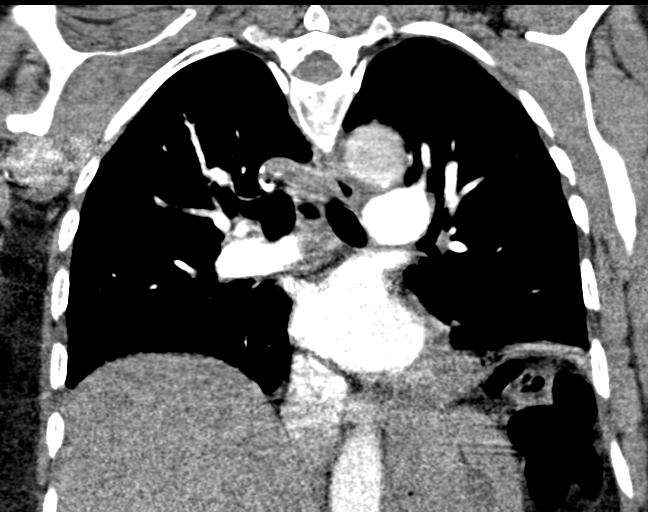

[14 of 36 positions shown; findings below may reference images not displayed]

FINDINGS: Cardiovascular: Satisfactory opacification the pulmonary arteries to
the segmental level. No pulmonary artery filling defects are
identified. Central pulmonary arteries are normal caliber. Normal
heart size. No pericardial effusion. Few coronary artery
calcifications. Atherosclerotic plaque within the normal caliber
aorta. No acute aortic abnormality. No periaortic stranding or
hemorrhage. Minimal plaque in the normally branching proximal great
vessels.

Mediastinum/Nodes: No mediastinal fluid or gas. 1.5 cm
hypoattenuating nodule in the posterior left thyroid gland. No
concerning right thyroid nodules. Thoracic inlet is otherwise
unremarkable. No acute abnormality of the trachea or esophagus. No
worrisome mediastinal, or hilar adenopathy. Clustered prominent
bilateral axillary nodes are similar to comparisons.

Lungs/Pleura: Evaluation of the lung parenchyma is significantly
limited by respiratory motion artifact. There are areas of grossly
stable reticular change in the medial segment of the right middle
lobe and medial basal segment right lower lobe corresponding well to
areas of suspected fibrosis on the comparison CT. A small left
pleural effusion is present with some adjacent passive and
subsegmental atelectasis in the lower lung with more mild diffuse
dependent atelectasis bilaterally. No focal consolidative airspace
opacity. Diffuse mild airways thickening is noted. Mild
redistribution of the pulmonary vascularity. Previously seen 4 mm
nodule in the right lower lobe is no longer visualized on this
examination. There is a stable 3 mm sub fissural nodule within the
lingula (6/61). No concerning pulmonary nodules or masses are seen.

Upper Abdomen: No acute abnormalities present in the visualized
portions of the upper abdomen.

Musculoskeletal: No chest wall mass or suspicious bone lesions
identified. Exaggerated thoracic kyphosis with mild multilevel
discogenic changes in the spine. Additional mild degenerative
changes in both shoulders.

Review of the MIP images confirms the above findings.
IMPRESSION: 1. No evidence of acute pulmonary artery embolism.
2. Small left pleural effusion with some adjacent passive and
subsegmental atelectasis in the lower lung with more mild diffuse
dependent atelectasis bilaterally.
3. Redistribution of the pulmonary vascularity and some mild airways
thickening could reflect early changes of interstitial edema.
4. Stable areas of reticular change in the medial segment of the
right middle lobe and medial basal segment right lower lobe
corresponding well to areas of suspected fibrosis on the comparison
CT.
5. Previously seen 4 mm nodule in the right lower lobe is no longer
visualized. Stable 3 mm sub fissural nodule in the lingula, possibly
benign fissural lymph node.
6. Aortic Atherosclerosis (RYZMX-M6P.P).

ADDENDUM:
Additional impression point:

1.5 cm nodule in the posterior left thyroid lobe. Recommend further
evaluation with outpatient thyroid ultrasound if not performed
previously. This follows consensus guidelines: Managing Incidental
Thyroid Nodules Detected on Imaging: White Paper of [REDACTED]. [HOSPITAL] 6393;
[DATE]. and [place] 3-tiered system for managing ITNs: [HOSPITAL]. [DATE]): 143-50

*** End of Addendum ***
FINDINGS: Cardiovascular: Satisfactory opacification the pulmonary arteries to
the segmental level. No pulmonary artery filling defects are
identified. Central pulmonary arteries are normal caliber. Normal
heart size. No pericardial effusion. Few coronary artery
calcifications. Atherosclerotic plaque within the normal caliber
aorta. No acute aortic abnormality. No periaortic stranding or
hemorrhage. Minimal plaque in the normally branching proximal great
vessels.

Mediastinum/Nodes: No mediastinal fluid or gas. 1.5 cm
hypoattenuating nodule in the posterior left thyroid gland. No
concerning right thyroid nodules. Thoracic inlet is otherwise
unremarkable. No acute abnormality of the trachea or esophagus. No
worrisome mediastinal, or hilar adenopathy. Clustered prominent
bilateral axillary nodes are similar to comparisons.

Lungs/Pleura: Evaluation of the lung parenchyma is significantly
limited by respiratory motion artifact. There are areas of grossly
stable reticular change in the medial segment of the right middle
lobe and medial basal segment right lower lobe corresponding well to
areas of suspected fibrosis on the comparison CT. A small left
pleural effusion is present with some adjacent passive and
subsegmental atelectasis in the lower lung with more mild diffuse
dependent atelectasis bilaterally. No focal consolidative airspace
opacity. Diffuse mild airways thickening is noted. Mild
redistribution of the pulmonary vascularity. Previously seen 4 mm
nodule in the right lower lobe is no longer visualized on this
examination. There is a stable 3 mm sub fissural nodule within the
lingula (6/61). No concerning pulmonary nodules or masses are seen.

Upper Abdomen: No acute abnormalities present in the visualized
portions of the upper abdomen.

Musculoskeletal: No chest wall mass or suspicious bone lesions
identified. Exaggerated thoracic kyphosis with mild multilevel
discogenic changes in the spine. Additional mild degenerative
changes in both shoulders.

Review of the MIP images confirms the above findings.
IMPRESSION: 1. No evidence of acute pulmonary artery embolism.
2. Small left pleural effusion with some adjacent passive and
subsegmental atelectasis in the lower lung with more mild diffuse
dependent atelectasis bilaterally.
3. Redistribution of the pulmonary vascularity and some mild airways
thickening could reflect early changes of interstitial edema.
4. Stable areas of reticular change in the medial segment of the
right middle lobe and medial basal segment right lower lobe
corresponding well to areas of suspected fibrosis on the comparison
CT.
5. Previously seen 4 mm nodule in the right lower lobe is no longer
visualized. Stable 3 mm sub fissural nodule in the lingula, possibly
benign fissural lymph node.
6. Aortic Atherosclerosis (RYZMX-M6P.P).

## 2020-12-31 ENCOUNTER — Other Ambulatory Visit: Payer: Self-pay | Admitting: *Deleted

## 2020-12-31 ENCOUNTER — Other Ambulatory Visit: Payer: Self-pay | Admitting: Pulmonary Disease

## 2020-12-31 DIAGNOSIS — R911 Solitary pulmonary nodule: Secondary | ICD-10-CM

## 2020-12-31 DIAGNOSIS — J4550 Severe persistent asthma, uncomplicated: Secondary | ICD-10-CM

## 2020-12-31 NOTE — Telephone Encounter (Signed)
Refill for Xolair sent to [facility].  Patient last seen by Dr. Isaiah Serge on 12/03/20 with plan to continue Xolair 375mg  every 14 days since she did not want to switch to , PharmD, MPH, BCPS Clinical Pharmacist (Rheumatology and Pulmonology)

## 2021-03-03 ENCOUNTER — Other Ambulatory Visit: Payer: BC Managed Care – PPO

## 2021-03-03 ENCOUNTER — Inpatient Hospital Stay: Admission: RE | Admit: 2021-03-03 | Payer: BC Managed Care – PPO | Source: Ambulatory Visit

## 2021-03-17 ENCOUNTER — Ambulatory Visit (INDEPENDENT_AMBULATORY_CARE_PROVIDER_SITE_OTHER)
Admission: RE | Admit: 2021-03-17 | Discharge: 2021-03-17 | Disposition: A | Discharge status: Home / Self Care | Payer: BC Managed Care – PPO | Source: Ambulatory Visit | Attending: Pulmonary Disease | Admitting: Pulmonary Disease

## 2021-03-17 ENCOUNTER — Other Ambulatory Visit: Payer: Self-pay

## 2021-03-17 DIAGNOSIS — R911 Solitary pulmonary nodule: Secondary | ICD-10-CM | POA: Diagnosis not present

## 2021-03-18 ENCOUNTER — Encounter: Payer: Self-pay | Admitting: Neurology

## 2021-03-21 ENCOUNTER — Other Ambulatory Visit: Payer: Self-pay | Admitting: Pulmonary Disease

## 2021-03-21 ENCOUNTER — Other Ambulatory Visit: Payer: Self-pay | Admitting: *Deleted

## 2021-03-21 DIAGNOSIS — R911 Solitary pulmonary nodule: Secondary | ICD-10-CM

## 2021-03-25 ENCOUNTER — Telehealth: Payer: Self-pay | Admitting: Oncology

## 2021-03-25 NOTE — Telephone Encounter (Signed)
Scheduled appt per 12/2 referral. Pt is established with Dr. Clelia Croft. Pt is aware of appt date and time.

## 2021-04-04 ENCOUNTER — Encounter: Payer: Self-pay | Admitting: Neurology

## 2021-04-04 ENCOUNTER — Ambulatory Visit (INDEPENDENT_AMBULATORY_CARE_PROVIDER_SITE_OTHER): Payer: BC Managed Care – PPO | Admitting: Neurology

## 2021-04-04 ENCOUNTER — Other Ambulatory Visit: Payer: Self-pay

## 2021-04-04 VITALS — BP 174/104 | HR 86 | Ht 70.0 in | Wt 169.0 lb

## 2021-04-04 DIAGNOSIS — R202 Paresthesia of skin: Secondary | ICD-10-CM | POA: Diagnosis not present

## 2021-04-04 DIAGNOSIS — M48061 Spinal stenosis, lumbar region without neurogenic claudication: Secondary | ICD-10-CM

## 2021-04-04 MED ORDER — CYCLOBENZAPRINE HCL 5 MG PO TABS: 5.0000 mg | ORAL_TABLET | Freq: Every evening | ORAL | 1 refills | Status: DC | PRN

## 2021-04-04 NOTE — Patient Instructions (Addendum)
Start flexeril 5mg  at bedtime as needed  MRI lumbar spine without contrast  Nerve testing of the legs. Do not apply oil,lotion, or moisturizer to your legs  ELECTROMYOGRAM AND NERVE CONDUCTION STUDIES (EMG/NCS) INSTRUCTIONS  How to Prepare The neurologist conducting the EMG will need to know if you have certain medical conditions. Tell the neurologist and other EMG lab personnel if you: Have a pacemaker or any other electrical medical device Take blood-thinning medications Have hemophilia, a blood-clotting disorder that causes prolonged bleeding Bathing Take a shower or bath shortly before your exam in order to remove oils from your skin. Dont apply lotions or creams before the exam.  What to Expect Youll likely be asked to change into a hospital gown for the procedure and lie down on an examination table. The following explanations can help you understand what will happen during the exam.  Electrodes. The neurologist or a technician places surface electrodes at various locations on your skin depending on where youre experiencing symptoms. Or the neurologist may insert needle electrodes at different sites depending on your symptoms.  Sensations. The electrodes will at times transmit a tiny electrical current that you may feel as a twinge or spasm. The needle electrode may cause discomfort or pain that usually ends shortly after the needle is removed. If you are concerned about discomfort or pain, you may want to talk to the neurologist about taking a short break during the exam.  Instructions. During the needle EMG, the neurologist will assess whether there is any spontaneous electrical activity when the muscle is at rest - activity that isnt present in healthy muscle tissue - and the degree of activity when you slightly contract the muscle.  He or she will give you instructions on resting and contracting a muscle at appropriate times. Depending on what muscles and nerves the neurologist is  examining, he or she may ask you to change positions during the exam.  After your EMG You may experience some temporary, minor bruising where the needle electrode was inserted into your muscle. This bruising should fade within several days. If it persists, contact your primary care doctor.

## 2021-04-04 NOTE — Progress Notes (Signed)
[facility] Clinic Note - Initial Visit   Date: 04/04/21  Megan Reid MRN: [id number] DOB: 1965/10/25   Dear Dr.Varadarajan:  Thank you for your kind referral of Megan Reid for consultation of neuropathy. Although her history is well known to you, please allow [place] to reiterate it for the purpose of our medical record. The patient was accompanied to the clinic by self.   History of Present Illness: Megan Reid is a 55 y.o. right-handed female with astham, Sjogren's disease, cytopenia, and lumbar canal stenosis presenting for evaluation of bilateral feet numbness/tingling.   She was evaluated by me in 2018 for bilateral feet paresthesias. NCS/EMG showed chronic right L5-S1 radiculopathy, no large fiber neuropathy.  MRI lumbar spine from 2015 shows severe facet disease at L5-S1 and moderately severe spinal and bilateral lateral recess stenosis and biforaminal stenosis att his level. She was referred for PT which significant helped her right leg numbness/tingling and improved symptoms on the left.  Over the past year, she has noticed worsening numbness/tingling of the feet which has moved up towards her ankles.  Symptoms are constant. Nothing makes her symptoms better or worse.  She has tried home PT exercises, which has not helped.  She denies weakness.  She has some unsteadiness first thing in the morning. She has noticed that symptoms get worse with walking.  She endorses low back pain and muscle cramps.  She was previously seeing [facility] and [facility] and currently not under the care of rheumatology.     Out-side paper records, electronic medical record, and images have been reviewed where available and summarized as:  NCS/EMG of the legs 04/24/2017: Chronic radiculopathy affecting the right L5-S1 nerve root/segment; moderate in degree electrically. There is no evidence of a sensorimotor polyneuropathy affecting the lower  extremities.   MRI lumbar spine wo contrast 08/14/2013:  Severe facet disease at L5-S1 contributing to non isthmic  spondylolisthesis of L5. There is associated advanced degenerative disc disease and there is moderately severe spinal and bilateral lateral recess stenosis and moderate to moderately severe bilateral foraminal stenosis.   No results found for: HGBA1C Lab Results  Component Value Date   VITAMINB12 1,230 (H) 03/30/2017   Lab Results  Component Value Date   TSH 0.84 02/15/2016   Lab Results  Component Value Date   ESRSEDRATE 120 (H) 09/14/2016    Past Medical History:  Diagnosis Date   Arthralgia 02/14/2016   Asthma    Autoimmune disease ([place]) 02/14/2016   Positive RF 139 CCP Negative  Positive ANA 1:640 Positive Ro Positive [place] Elevated ESR 105 - Sjorgen's Disease   DDD (degenerative disc disease), lumbar 02/14/2016   Fatigue 02/14/2016   Gastritis 02/14/2016   Hot flashes    Migraines 02/14/2016   Osteoarthritis of both hands 02/14/2016   Osteoarthritis of both knees 02/14/2016   Vitamin D deficiency 02/14/2016    Past Surgical History:  Procedure Laterality Date   BREAST BIOPSY Left    ROTATOR CUFF REPAIR Right 06/16/2019   TUBAL LIGATION       Medications:  Outpatient Encounter Medications as of 04/04/2021  Medication Sig   albuterol (PROVENTIL HFA;VENTOLIN HFA) 108 (90 Base) MCG/ACT inhaler Inhale 2 puffs into the lungs 2 (two) times daily. For shortness of breath   cyclobenzaprine (FLEXERIL) 5 MG tablet Take 1 tablet (5 mg total) by mouth at bedtime as needed for muscle spasms.   fluticasone (FLONASE) 50 MCG/ACT nasal spray Apply 2 sprays per nostril daily  montelukast (SINGULAIR) 10 MG tablet TAKE 1 TABLET(10 MG) BY MOUTH EVERY EVENING   omalizumab (XOLAIR) 150 MG/ML prefilled syringe INJECT 2 SYRINGES UNDER THE SKIN EVERY 2 WEEKS. TOTAL DOSE =375MG   omalizumab (XOLAIR) 75 MG/0.5ML prefilled syringe INJECT 75MG INTO THE SKIN EVERY 14 DAYS IN  ADDITION TO TWO 150MG SYRINGES. (TOTAL DOSE: 375MG EVERY 14 DAYS)   pantoprazole (PROTONIX) 40 MG tablet Take 1 tablet (40 mg total) by mouth 2 (two) times daily. (Patient taking differently: Take 40 mg by mouth daily.)   SYMBICORT 160-4.5 MCG/ACT inhaler INHALE 2 PUFFS INTO THE LUNGS TWICE DAILY   Tiotropium Bromide Monohydrate (SPIRIVA RESPIMAT) 2.5 MCG/ACT AERS Inhale 2 puffs into the lungs daily.   No facility-administered encounter medications on file as of 04/04/2021.    Allergies:  Allergies  Allergen Reactions   Strawberry Extract Other (See Comments)    Unknown reaction   Sulfa Antibiotics Anaphylaxis, Hives and Swelling    Swelling of the face and tongue     Family History: Family History  Problem Relation Age of Onset   Kidney failure Mother    Heart failure Mother    Lupus Mother    COPD Father    Hypertension Sister    Acromegaly Sister    Breast cancer Paternal Aunt 110    Social History: Social History   Tobacco Use   Smoking status: Former    Packs/day: 0.50    Years: 2.00    Pack years: 1.00    Types: Cigarettes    Quit date: 05/16/1986    Years since quitting: 34.9   Smokeless tobacco: Never   Tobacco comments:    TEEN AGER  Vaping Use   Vaping Use: Never used  Substance Use Topics   Alcohol use: No   Drug use: No   Social History   Social History Narrative   Lives with husband.  Has 4 children.  Teacher.  Education: college.       Right Handed    Lives in a one story home     Vital Signs:  BP (!) 174/104    Pulse 86    Ht 5' 10"  (1.778 m)    Wt 169 lb (76.7 kg)    LMP 05/22/2012    SpO2 99%    BMI 24.25 kg/m   Neurological Exam: MENTAL STATUS including orientation to time, place, person, recent and remote memory, attention span and concentration, language, and fund of knowledge is normal.  Speech is not dysarthric.  CRANIAL NERVES: II:  No visual field defects.  III-IV-VI: Pupils equal round and reactive to light.  Normal conjugate,  extra-ocular eye movements in all directions of gaze.  No nystagmus.  No ptosis.   V:  Normal facial sensation.    VII:  Normal facial symmetry and movements.   VIII:  Normal hearing and vestibular function.   IX-X:  Normal palatal movement.   XI:  Normal shoulder shrug and head rotation.   XII:  Normal tongue strength and range of motion, no deviation or fasciculation.  MOTOR:  No atrophy, fasciculations or abnormal movements.  No pronator drift.   Upper Extremity:  Right  Left  Deltoid  5/5   5/5   Biceps  5/5   5/5   Triceps  5/5   5/5   Infraspinatus 5/5  5/5  Medial pectoralis 5/5  5/5  Wrist extensors  5/5   5/5   Wrist flexors  5/5   5/5   Finger extensors  5/5   5/5   Finger flexors  5/5   5/5   Dorsal interossei  5/5   5/5   Abductor pollicis  5/5   5/5   Tone (Ashworth scale)  0  0   Lower Extremity:  Right  Left  Hip flexors  5/5   5/5   Hip extensors  5/5   5/5   Adductor 5/5  5/5  Abductor 5/5  5/5  Knee flexors  5/5   5/5   Knee extensors  5/5   5/5   Dorsiflexors  5/5   5/5   Plantarflexors  5/5   5/5   Toe extensors  5/5   5/5   Toe flexors  5/5   5/5   Tone (Ashworth scale)  0  0   MSRs:  Right        Left                  brachioradialis 2+  2+  biceps 2+  2+  triceps 2+  2+  patellar 3+  3+  ankle jerk 2+  2+  Hoffman no  no  plantar response down  down   SENSORY:  Reduced vibration at the great toe bilaterally. Normal and symmetric perception of light touch, pinprick, and temperature.  Romberg's sign is positive.   COORDINATION/GAIT: Normal finger-to- nose-finger.  Intact rapid alternating movements bilaterally.  Gait narrow based and stable. Tandem and stressed gait intact.    IMPRESSION/PLAN: Bilateral feet paresthesias with know L5-S1 lumbosacral canal stenosis and radiculopathy. Prior EMG from 2019 for the same symptoms did not show large fiber neuropathy and favored L5-S1 radiculopathy.  Symptoms improved with PT.  She presents with  worsening symptoms now and it's possible this may again be stemming from her lumbar canal stenosis.  However, neuropathy cannot be excluded with her history of Sjogren's syndrome. To help differentiate her this, MRI lumbar spine wo contrast and EMG BLE will be ordered. For cramps, start flexeril 50m qhs as needed.  Further recommendations pending results.    Thank you for allowing me to participate in patient's care.  If I can answer any additional questions, I would be pleased to do so.    Sincerely,    Jahziah Simonin K. PPosey Pronto DO

## 2021-04-08 ENCOUNTER — Ambulatory Visit
Admission: RE | Admit: 2021-04-08 | Discharge: 2021-04-08 | Disposition: A | Discharge status: Home / Self Care | Payer: BC Managed Care – PPO | Source: Ambulatory Visit | Attending: Neurology | Admitting: Neurology

## 2021-04-08 DIAGNOSIS — R202 Paresthesia of skin: Secondary | ICD-10-CM

## 2021-04-08 DIAGNOSIS — M48061 Spinal stenosis, lumbar region without neurogenic claudication: Secondary | ICD-10-CM

## 2021-04-13 ENCOUNTER — Other Ambulatory Visit: Payer: Self-pay

## 2021-04-13 DIAGNOSIS — R202 Paresthesia of skin: Secondary | ICD-10-CM

## 2021-04-13 DIAGNOSIS — M48061 Spinal stenosis, lumbar region without neurogenic claudication: Secondary | ICD-10-CM

## 2021-04-22 ENCOUNTER — Inpatient Hospital Stay: Payer: BC Managed Care – PPO | Attending: Oncology | Admitting: Oncology

## 2021-04-22 ENCOUNTER — Other Ambulatory Visit: Payer: Self-pay

## 2021-04-22 VITALS — BP 166/103 | HR 84 | Temp 97.2°F | Resp 19 | Ht 70.0 in | Wt 171.6 lb

## 2021-04-22 DIAGNOSIS — D709 Neutropenia, unspecified: Secondary | ICD-10-CM | POA: Insufficient documentation

## 2021-04-22 DIAGNOSIS — R221 Localized swelling, mass and lump, neck: Secondary | ICD-10-CM | POA: Diagnosis not present

## 2021-04-22 DIAGNOSIS — R591 Generalized enlarged lymph nodes: Secondary | ICD-10-CM | POA: Insufficient documentation

## 2021-04-22 NOTE — Progress Notes (Signed)
Hematology and Oncology Follow Up Visit  Megan Reid [id number] April 30, 1965 56 y.o. 04/22/2021 8:42 AM   Principle Diagnosis: 56 year old woman with benign fluctuating neutropenia diagnosed in 2013.  Differential diagnosis related to autoimmune etiology versus benign causes.   Prior Therapy: She is status post a bone marrow biopsy which did not show any evidence of dysplasia. PET CT scan in October 2015 did not show any evidence of lymphadenopathy.  Current therapy: Active surveillance.   Interim History:  Megan Reid presents today for a follow-up visit.  Since last visit, she reports a few complaints.  She was diagnosed with COVID-19 infection in November and currently recovered.  She has reported increased cough and dyspnea on exertion that is improving.  She has also reported some residual issues including fatigue, neuropathy which could be consistent with long COVID.  She is also reported some neck discomfort and thyroid nodules.  Imaging studies of the chest showed some abnormalities although no evidence of malignancy.      Medications: Reviewed without changes.  Current Outpatient Medications  Medication Sig Dispense Refill   albuterol (PROVENTIL HFA;VENTOLIN HFA) 108 (90 Base) MCG/ACT inhaler Inhale 2 puffs into the lungs 2 (two) times daily. For shortness of breath 18 g 0   cyclobenzaprine (FLEXERIL) 5 MG tablet Take 1 tablet (5 mg total) by mouth at bedtime as needed for muscle spasms. 30 tablet 1   fluticasone (FLONASE) 50 MCG/ACT nasal spray Apply 2 sprays per nostril daily 16 g 5   montelukast (SINGULAIR) 10 MG tablet TAKE 1 TABLET(10 MG) BY MOUTH EVERY EVENING 30 tablet 5   omalizumab (XOLAIR) 150 MG/ML prefilled syringe INJECT 2 SYRINGES UNDER THE SKIN EVERY 2 WEEKS. TOTAL DOSE =375MG 4 mL 5   omalizumab (XOLAIR) 75 MG/0.5ML prefilled syringe INJECT 75MG INTO THE SKIN EVERY 14 DAYS IN ADDITION TO TWO 150MG SYRINGES. (TOTAL DOSE: 375MG EVERY 14 DAYS) 2 mL 5   pantoprazole  (PROTONIX) 40 MG tablet Take 1 tablet (40 mg total) by mouth 2 (two) times daily. (Patient taking differently: Take 40 mg by mouth daily.) 60 tablet 2   SYMBICORT 160-4.5 MCG/ACT inhaler INHALE 2 PUFFS INTO THE LUNGS TWICE DAILY 10.2 g 5   Tiotropium Bromide Monohydrate (SPIRIVA RESPIMAT) 2.5 MCG/ACT AERS Inhale 2 puffs into the lungs daily. 4 g 5   No current facility-administered medications for this visit.    Allergies:  Allergies  Allergen Reactions   Strawberry Extract Other (See Comments)    Unknown reaction   Sulfa Antibiotics Anaphylaxis, Hives and Swelling    Swelling of the face and tongue       Physical Exam:   Blood pressure (!) 166/103, pulse 84, temperature (!) 97.2 F (36.2 C), temperature source Tympanic, resp. rate 19, height 5' 10"  (1.778 m), weight 171 lb 9.6 oz (77.8 kg), last menstrual period 05/22/2012, SpO2 100 %.   ECOG: 1    General appearance: Comfortable appearing without any discomfort Head: Normocephalic without any trauma Oropharynx: Mucous membranes are moist and pink without any thrush or ulcers. Eyes: Pupils are equal and round reactive to light. Lymph nodes: No cervical, supraclavicular, inguinal or axillary lymphadenopathy.   Heart:regular rate and rhythm.  S1 and S2 without leg edema. Lung: Clear without any rhonchi or wheezes.  No dullness to percussion. Abdomin: Soft, nontender, nondistended with good bowel sounds.  No hepatosplenomegaly. Musculoskeletal: No joint deformity or effusion.  Full range of motion noted. Neurological: No deficits noted on motor, sensory and deep tendon reflex  exam. Skin: No petechial rash or dryness.  Appeared moist.       Lab Results: Lab Results  Component Value Date   WBC 3.2 (L) 02/04/2020   HGB 10.6 (L) 02/04/2020   HCT 33.5 (L) 02/04/2020   MCV 86.1 02/04/2020   PLT 136 (L) 02/04/2020     Chemistry      Component Value Date/Time   NA 137 12/05/2019 0310   NA 142 09/29/2015 0000   NA 139  04/13/2015 1323   K 3.9 12/05/2019 0310   K 4.2 04/13/2015 1323   CL 104 12/05/2019 0310   CL 104 03/23/2013 0101   CL 105 01/11/2012 1349   CO2 27 12/05/2019 0310   CO2 26 04/13/2015 1323   BUN 16 12/05/2019 0310   BUN 18 09/29/2015 0000   BUN 15.9 04/13/2015 1323   CREATININE 0.88 12/05/2019 0310   CREATININE 0.89 10/30/2016 1046   CREATININE 0.9 04/13/2015 1323   GLU 92 09/29/2015 0000      Component Value Date/Time   CALCIUM 9.4 12/05/2019 0310   CALCIUM 9.7 04/13/2015 1323   ALKPHOS 47 12/05/2019 0310   ALKPHOS 65 04/13/2015 1323   AST 36 12/05/2019 0310   AST 26 04/13/2015 1323   ALT 17 12/05/2019 0310   ALT 13 04/13/2015 1323   BILITOT 0.3 12/05/2019 0310   BILITOT 0.30 04/13/2015 1323         Impression and Plan:  56 year old woman with:   1.  Neutropenia dating back to 2013.  Her most recent labs November 2022 were personally reviewed and showed no evidence of worsening neutropenia at this time.  The natural course of this disease was reviewed and differential diagnosis were discussed.  Given the chronicity of these findings and the fluctuating nature of her neutropenia, I doubt that this represents a blood disorder.  Bone marrow biopsy has been obtained in the past and did not show any infiltrative bone marrow process.    2.  Lymphadenopathy: Unclear etiology and her work-up has been negative.  She had a PET scan previously.  She had also had multiple imaging studies of the chest which did not show any lymphadenopathy.  3.  Pulmonary considerations: None of them are of the malignant nature at this time.  CT scan obtained on March 17, 2021 was personally reviewed and showed no evidence of malignancy.  4.  Neck masses: These are chronic findings although could be exacerbated.  We will obtain ultrasound of the neck to rule out any thyroid or any other masses.   5.  Follow-up: To be determined following her work-up.   30 minutes were spent on this visit.   The time was dedicated to reviewing laboratory data, disease status update and outlining future plan of care.   Zola Button, MD 1/6/20238:42 AM

## 2021-05-04 ENCOUNTER — Ambulatory Visit (INDEPENDENT_AMBULATORY_CARE_PROVIDER_SITE_OTHER): Payer: BC Managed Care – PPO | Admitting: Pulmonary Disease

## 2021-05-04 ENCOUNTER — Encounter: Payer: Self-pay | Admitting: Pulmonary Disease

## 2021-05-04 ENCOUNTER — Other Ambulatory Visit: Payer: Self-pay

## 2021-05-04 VITALS — BP 112/62 | HR 88 | Temp 97.8°F | Ht 70.0 in | Wt 168.0 lb

## 2021-05-04 DIAGNOSIS — R911 Solitary pulmonary nodule: Secondary | ICD-10-CM

## 2021-05-04 DIAGNOSIS — J4550 Severe persistent asthma, uncomplicated: Secondary | ICD-10-CM | POA: Diagnosis not present

## 2021-05-04 NOTE — Progress Notes (Signed)
Megan Reid    [id number]    Jan 19, 1966  Primary Care Physician:Varadarajan, Soyla Murphy, MD  Referring Physician: [name], MD [address] [place],  [place] [address]  Chief complaint:  Follow up for Asthma [name], November 2021  HPI: 56 year old with history of asthma, autoimmune disease with rheumatoid arthritis, Sjogren's syndrome  Previously followed by Dr. Corliss Skains and rheumatology at [facility].  She was on plaquenil, methotrexate and folic acid, prednisone.  She has not followed up with rheumatology since 2018 and is currently not on treatment.  Last rheumatology note was from [facility] in 2018 where they discussed possibly needing leflunomide or CellCept.  But she has not followed back since the co-pay was high at [facility] She had a high-res CT in 2018 with no evidence of interstitial lung disease.  CTA in December 2019 showed some groundglass opacities at the bases but no follow-up CT was done  Has history of asthma for which she is on Symbicort but is using it 2 puffs once a day.  Also has significant allergic rhinitis, GERD Started on Xolair in 2021 but with improvement in the frequency of asthma exacerbations continues to have high symptom burden with persistent dyspnea, needing to use rescue medication several times a day and persistent cough We discussed changing Biologics to tezspire but she is reluctant to make the switch  Pets: Has a cat.  No birds, farm animal Occupation: Runner, broadcasting/film/video for fourth grade Exposures: Reports mold exposure at school but currently she is working remotely from home.  She also has a comfortable.  No hot tubs, Jacuzzi's, humidifier Smoking history: Remote smoking history as a teenager Travel history: Previously lived in [place], [place].  She has been in [place] for the past 7 years.  No significant recent travel Relevant family history: Father had emphysema.  He was a smoker.  Interim history: Continues  on Xolair, inhalers, Singulair States that breathing is stable but continues to have coughing spells in the morning and evening We had discussed changing her biologic to test for but she did not need to make the switch as this would involve in facility injections  She is on PPI Prilosec 40 mg twice daily which she not make a significant difference in cough and is back down to once a day  Follows with Dr. Selena Batten for food allergies  She is being followed by Dr. Clelia Croft for thyroid nodules and ultrasound has been ordered  Outpatient Encounter Medications as of 05/04/2021  Medication Sig   albuterol (PROVENTIL HFA;VENTOLIN HFA) 108 (90 Base) MCG/ACT inhaler Inhale 2 puffs into the lungs 2 (two) times daily. For shortness of breath   cyclobenzaprine (FLEXERIL) 5 MG tablet Take 1 tablet (5 mg total) by mouth at bedtime as needed for muscle spasms.   fluticasone (FLONASE) 50 MCG/ACT nasal spray Apply 2 sprays per nostril daily   hydrochlorothiazide (HYDRODIURIL) 25 MG tablet Take 25 mg by mouth every morning.   montelukast (SINGULAIR) 10 MG tablet TAKE 1 TABLET(10 MG) BY MOUTH EVERY EVENING   omalizumab (XOLAIR) 150 MG/ML prefilled syringe INJECT 2 SYRINGES UNDER THE SKIN EVERY 2 WEEKS. TOTAL DOSE =375MG    omalizumab (XOLAIR) 75 MG/0.5ML prefilled syringe INJECT  INTO THE SKIN EVERY 14 DAYS IN ADDITION TO TWO  SYRINGES. (TOTAL DOSE:  EVERY 14 DAYS)   pantoprazole (PROTONIX) 40 MG tablet Take 1 tablet (40 mg total) by mouth 2 (two) times daily. (Patient taking differently: Take 40 mg  by mouth daily.)   SYMBICORT 160-4.5 MCG/ACT inhaler INHALE 2 PUFFS INTO THE LUNGS TWICE DAILY   Tiotropium Bromide Monohydrate (SPIRIVA RESPIMAT) 2.5 MCG/ACT AERS Inhale 2 puffs into the lungs daily.   No facility-administered encounter medications on file as of 05/04/2021.   Physical Exam: Blood pressure 112/62, pulse 88, temperature 97.8 F (36.6 C), temperature source Oral, height 5\' 10"  (1.778 m), weight  168 lb (76.2 kg), last menstrual period 05/22/2012, SpO2 99 %. Gen:      No acute distress HEENT:  EOMI, sclera anicteric Neck:     No masses; no thyromegaly Lungs:    Clear to auscultation bilaterally; normal respiratory effort CV:         Regular rate and rhythm; no murmurs Abd:      + bowel sounds; soft, non-tender; no palpable masses, no distension Ext:    No edema; adequate peripheral perfusion Skin:      Warm and dry; no rash Neuro: alert and oriented x 3 Psych: normal mood and affect   Data Reviewed: Imaging: CT high-resolution 09/21/2016-biapical scarring.  No evidence of interstitial lung disease.  4 mm nodule in the left major fissure. CTA 04/14/2018 no PE, patchy groundglass opacities and reticulation in the mid to lower lungs. Chest x-ray 04/26/2018-no active cardiopulmonary disease CTA 12/05/2019-no PE, subsegmental atelectasis, stable reticular changes in the right middle lobe and right lower lobe.  CT high-resolution 01/14/2020-interstitial opacity in the right middle lobe, minimal scarring.  No evidence of ILD CT 03/18/2021-mild patchy groundglass opacities which are unchanged, interval resolution of left lower lobe lung nodule, new 3 mm nodule in the left lower lobe. I have reviewed the images personally.  PFTs  04/09/2019 FVC 3.60 [102%], FEV1 2.73 [97%], F/F 76, TLC 5.09 [5%], DLCO 17.46 [69%]  02/04/2019 FVC 3.40 [97%], FEV1 2.67 [96%], F/F 79, TLC 5.31 [89%], DLCO 17.65 [70%] Minimal obstructive airway disease with minimal diffusion defect  02/04/2020 FVC 3.40 [19%], FEV1 2.67 [96%], F/F 79, TLC 5.31 [9%], DLCO 17.65 [90%], Minimal obstruction, minimal diffusion defect  ACT score 01/09/2020-10 ACT score 03/10/2020-11 ACT score 08/06/2020-12 ACT score 12/03/20- 15  Labs: CBC 10/09/2018-WBC 3.6, hemoglobin 9.5, platelets 129, eos 0% CBC 02/04/2020-WBC 2.2, eos 0% IgE 12/30/2019-1726  Hypersensitivity panel 02/07/2019-negative  Assessment:  Asthma Xolair has  made improvement in frequency of exacerbations. But she continues to have symptoms of chronic cough, occasional dyspnea She is not interested in making the switch to [employer]. She does not qualify for other biologics as her peripheral eosinophil cell count is low  Continue Symbicort, Singulair and Spiriva  Allergic rhinitis, GERD Has significant issues with rhinitis, GERD Continue Protonix once a day.  Does not want to use twice a day for fear of side effects I will asked her to resume Flonase, she will use chlorpheniramine over-the-counter for postnasal drip  She has been treated already referred by allergy for ENT referral to evaluate chronic cough  Autoimmune disease, arthritis, Sjogren's syndrome Previously followed by Dr. Corliss Skains, rheumatology and at [facility] rheumatology since she has not been seen since 2019 Exposure history notable for down comforters.  She has gotten rid of them earlier in 2020 No evidence of ILD on high-res CT  She has raised the concern for sarcoidosis today but I do not see any evidence of this as his CT scan is not typical.  She did have borderline lymph nodes in the past but PET scan in 2015 was negative.  More recent CTs do not show any lymphadenopathy  Plan/Recommendations: - Continue inhalers, Singulair, Xolair  Follow-up in 6 months  Chilton Greathouse MD [facility] 05/04/2021, 4:24 PM  CC: [name],*

## 2021-05-04 NOTE — Patient Instructions (Signed)
I have reviewed his CT scan which shows a tiny lung nodule which looks benign.  We can follow it up in 1 year We can also review the results of your recent neck ultrasound Continue inhalers and Xolair Follow-up in 6 months

## 2021-05-05 ENCOUNTER — Telehealth: Payer: Self-pay

## 2021-05-05 ENCOUNTER — Ambulatory Visit (INDEPENDENT_AMBULATORY_CARE_PROVIDER_SITE_OTHER): Payer: BC Managed Care – PPO | Admitting: Neurology

## 2021-05-05 DIAGNOSIS — M5416 Radiculopathy, lumbar region: Secondary | ICD-10-CM

## 2021-05-05 DIAGNOSIS — R202 Paresthesia of skin: Secondary | ICD-10-CM

## 2021-05-05 DIAGNOSIS — M48061 Spinal stenosis, lumbar region without neurogenic claudication: Secondary | ICD-10-CM

## 2021-05-05 NOTE — Procedures (Signed)
[facility]  [address], [address]  [place], [place] [address] Tel: [phone] Fax:  [phone] Test Date:  05/05/2021  Patient: Megan Reid  DOB: 05/16/1965 Physician: Nita Sickleonika Ayde Record, DO  Sex: Female Height: 5\' 10"  Ref Phys: Nita Sickleonika Steed Kanaan, DO  ID#: [id number]   Technician:    Patient Complaints: This is a 56 year old female referred for evaluation of bilateral feet numbness.  NCV & EMG Findings: Extensive electrodiagnostic testing of the right lower extremity and additional studies of the left shows:  Bilateral sural and superficial peroneal sensory responses are within normal limits and improved from prior study on 04/24/2017. Right peroneal motor response is absent at the extensor digitorum brevis, and normal at the tibialis anterior.  Left peroneal and bilateral tibial motor responses are within normal limits. Bilateral tibial H reflex studies are within normal limits. Chronic motor axonal loss changes are seen affecting the right L5 myotome, without accompanying active denervation.  Neurogenic changes involving the S1 myotome is no longer present on the study.  Impression: Chronic L5 radiculopathy affecting the right lower extremity, mild-to-moderate.  There has been mild improvement as compared to prior study on 04/24/2017.   In particular, there is no evidence of a sensorimotor polyneuropathy affecting the lower extremities.   ___________________________ Nita Sickleonika [name], DO    Nerve Conduction Studies Anti Sensory Summary Table   Stim Site NR Peak (ms) Norm Peak (ms) P-T Amp (V) Norm P-T Amp  Left Sup Peroneal Anti Sensory (Ant Lat Mall)  32C  12 cm    2.5 <4.6 8.9 >4  Right Sup Peroneal Anti Sensory (Ant Lat Mall)  32C  12 cm    2.6 <4.6 8.8 >4  Left Sural Anti Sensory (Lat Mall)  32C  Calf    3.7 <4.6 9.4 >4  Right Sural Anti Sensory (Lat Mall)  32C  Calf    2.8 <4.6 12.5 >4   Motor Summary Table   Stim Site NR Onset (ms) Norm Onset (ms) O-P Amp  (mV) Norm O-P Amp Site1 Site2 Delta-0 (ms) Dist (cm) Vel (m/s) Norm Vel (m/s)  Left Peroneal Motor (Ext Dig Brev)  32C  Ankle    4.3 <6.0 2.6 >2.5 B Fib Ankle 9.1 38.0 42 >40  B Fib    13.4  2.2  Poplt B Fib 1.6 8.0 50 >40  Poplt    15.0  2.1         Right Peroneal Motor (Ext Dig Brev)  32C  Ankle NR  <6.0  >2.5 B Fib Ankle  0.0  >40  B Fib NR     Poplt B Fib  0.0  >40  Poplt NR            Left Peroneal TA Motor (Tib Ant)  32C  Fib Head    3.0 <4.5 3.7 >3 Poplit Fib Head 1.3 8.0 62 >40  Poplit    4.3  3.4         Right Peroneal TA Motor (Tib Ant)  32C  Fib Head    2.4 <4.5 3.7 >3 Poplit Fib Head 1.3 8.0 62 >40  Poplit    3.7  3.5         Left Tibial Motor (Abd Hall Brev)  32C  Ankle    4.0 <6.0 5.0 >4 Knee Ankle 10.5 43.0 41 >40  Knee    14.5  3.5         Right Tibial Motor (Abd Hall Brev)  32C  Ankle  3.4 <6.0 7.8 >4 Knee Ankle 11.2 46.0 41 >40  Knee    14.6  4.2          H Reflex Studies   NR H-Lat (ms) Lat Norm (ms) L-R H-Lat (ms)  Left Tibial (Gastroc)  32C     34.83 <35 0.27  Right Tibial (Gastroc)  32C     34.56 <35 0.27   EMG   Side Muscle Ins Act Fibs Psw Fasc Number Recrt Dur Dur. Amp Amp. Poly Poly. Comment  Right AntTibialis Nml Nml Nml Nml 1- Rapid Some 1+ Some 1+ Some 1+ N/A  Right Gastroc Nml Nml Nml Nml Nml Nml Nml Nml Nml Nml Nml Nml N/A  Right Flex Dig Long Nml Nml Nml Nml 1- Rapid Some 1+ Some 1+ Some 1+ N/A  Right RectFemoris Nml Nml Nml Nml Nml Nml Nml Nml Nml Nml Nml Nml N/A  Right GluteusMed Nml Nml Nml Nml Nml Nml Nml Nml Nml Nml Nml Nml N/A  Left AntTibialis Nml Nml Nml Nml Nml Nml Nml Nml Nml Nml Nml Nml N/A  Left Gastroc Nml Nml Nml Nml Nml Nml Nml Nml Nml Nml Nml Nml N/A  Left Flex Dig Long Nml Nml Nml Nml Nml Nml Nml Nml Nml Nml Nml Nml N/A  Left RectFemoris Nml Nml Nml Nml Nml Nml Nml Nml Nml Nml Nml Nml N/A  Left GluteusMed Nml Nml Nml Nml Nml Nml Nml Nml Nml Nml Nml Nml N/A      Waveforms:

## 2021-05-05 NOTE — Telephone Encounter (Signed)
Referral created.

## 2021-05-11 ENCOUNTER — Ambulatory Visit
Admission: RE | Admit: 2021-05-11 | Discharge: 2021-05-11 | Disposition: A | Discharge status: Home / Self Care | Payer: BC Managed Care – PPO | Source: Ambulatory Visit | Attending: Internal Medicine | Admitting: Internal Medicine

## 2021-05-11 DIAGNOSIS — E2839 Other primary ovarian failure: Secondary | ICD-10-CM

## 2021-05-12 ENCOUNTER — Other Ambulatory Visit: Payer: Self-pay

## 2021-05-12 ENCOUNTER — Other Ambulatory Visit: Payer: Self-pay | Admitting: Oncology

## 2021-05-12 ENCOUNTER — Ambulatory Visit (HOSPITAL_COMMUNITY)
Admission: RE | Admit: 2021-05-12 | Discharge: 2021-05-12 | Disposition: A | Discharge status: Home / Self Care | Payer: BC Managed Care – PPO | Source: Ambulatory Visit | Attending: Oncology | Admitting: Oncology

## 2021-05-12 DIAGNOSIS — R221 Localized swelling, mass and lump, neck: Secondary | ICD-10-CM | POA: Diagnosis present

## 2021-05-20 ENCOUNTER — Encounter: Payer: Self-pay | Admitting: Allergy

## 2021-06-08 ENCOUNTER — Ambulatory Visit: Payer: BC Managed Care – PPO | Admitting: Allergy

## 2021-06-22 ENCOUNTER — Emergency Department (HOSPITAL_COMMUNITY): Payer: BC Managed Care – PPO

## 2021-06-22 ENCOUNTER — Emergency Department (HOSPITAL_COMMUNITY)
Admission: EM | Admit: 2021-06-22 | Discharge: 2021-06-23 | Disposition: A | Discharge status: Home / Self Care | Payer: BC Managed Care – PPO | Attending: Emergency Medicine | Admitting: Emergency Medicine

## 2021-06-22 ENCOUNTER — Encounter (HOSPITAL_COMMUNITY): Payer: Self-pay

## 2021-06-22 ENCOUNTER — Other Ambulatory Visit: Payer: Self-pay

## 2021-06-22 DIAGNOSIS — N9489 Other specified conditions associated with female genital organs and menstrual cycle: Secondary | ICD-10-CM | POA: Insufficient documentation

## 2021-06-22 DIAGNOSIS — A09 Infectious gastroenteritis and colitis, unspecified: Secondary | ICD-10-CM | POA: Diagnosis not present

## 2021-06-22 DIAGNOSIS — R1084 Generalized abdominal pain: Secondary | ICD-10-CM | POA: Diagnosis present

## 2021-06-22 LAB — COMPREHENSIVE METABOLIC PANEL
ALT: 32 U/L (ref 0–44)
AST: 50 U/L — ABNORMAL HIGH (ref 15–41)
Albumin: 3.3 g/dL — ABNORMAL LOW (ref 3.5–5.0)
Alkaline Phosphatase: 60 U/L (ref 38–126)
Anion gap: 9 (ref 5–15)
BUN: 25 mg/dL — ABNORMAL HIGH (ref 6–20)
CO2: 24 mmol/L (ref 22–32)
Calcium: 10.1 mg/dL (ref 8.9–10.3)
Chloride: 100 mmol/L (ref 98–111)
Creatinine, Ser: 1.15 mg/dL — ABNORMAL HIGH (ref 0.44–1.00)
GFR, Estimated: 56 mL/min — ABNORMAL LOW (ref >60)
Glucose, Bld: 128 mg/dL — ABNORMAL HIGH (ref 70–99)
Potassium: 3.5 mmol/L (ref 3.5–5.1)
Sodium: 133 mmol/L — ABNORMAL LOW (ref 135–145)
Total Bilirubin: 0.4 mg/dL (ref 0.3–1.2)
Total Protein: 10.9 g/dL — ABNORMAL HIGH (ref 6.5–8.1)

## 2021-06-22 LAB — CBC
HCT: 37.9 % (ref 36.0–46.0)
Hemoglobin: 12.4 g/dL (ref 12.0–15.0)
MCH: 28.1 pg (ref 26.0–34.0)
MCHC: 32.7 g/dL (ref 30.0–36.0)
MCV: 85.9 fL (ref 80.0–100.0)
Platelets: 144 10*3/uL — ABNORMAL LOW (ref 150–400)
RBC: 4.41 MIL/uL (ref 3.87–5.11)
RDW: 13 % (ref 11.5–15.5)
WBC: 5.7 10*3/uL (ref 4.0–10.5)
nRBC: 0 % (ref 0.0–0.2)

## 2021-06-22 LAB — LIPASE, BLOOD: Lipase: 45 U/L (ref 11–51)

## 2021-06-22 LAB — ABO/RH: ABO/RH(D): A POS

## 2021-06-22 LAB — TYPE AND SCREEN
ABO/RH(D): A POS
Antibody Screen: NEGATIVE

## 2021-06-22 LAB — I-STAT BETA HCG BLOOD, ED (MC, WL, AP ONLY): I-stat hCG, quantitative: <5 m[IU]/mL (ref <5)

## 2021-06-22 MED ORDER — OXYCODONE-ACETAMINOPHEN 5-325 MG PO TABS
1.0000 | ORAL_TABLET | Freq: Once | ORAL | Status: AC
  Administered 2021-06-22: 1 via ORAL
  Filled 2021-06-22: qty 1

## 2021-06-22 MED ORDER — OXYCODONE-ACETAMINOPHEN 5-325 MG PO TABS: 1.0000 | ORAL_TABLET | Freq: Four times a day (QID) | ORAL | 0 refills | Status: DC | PRN

## 2021-06-22 MED ORDER — LEVOFLOXACIN 500 MG PO TABS
500.0000 mg | ORAL_TABLET | Freq: Once | ORAL | Status: DC
  Filled 2021-06-22: qty 1

## 2021-06-22 MED ORDER — LEVOFLOXACIN 500 MG PO TABS: 500.0000 mg | ORAL_TABLET | Freq: Every day | ORAL | 0 refills | Status: AC

## 2021-06-22 MED ORDER — IOHEXOL 300 MG/ML  SOLN
100.0000 mL | Freq: Once | INTRAMUSCULAR | Status: AC | PRN
  Administered 2021-06-22: 100 mL via INTRAVENOUS

## 2021-06-22 MED ORDER — OXYCODONE-ACETAMINOPHEN 5-325 MG PO TABS
1.0000 | ORAL_TABLET | Freq: Once | ORAL | Status: AC
Start: 2021-06-23 — End: 2021-06-23
  Administered 2021-06-23: 1 via ORAL
  Filled 2021-06-22: qty 1

## 2021-06-22 MED ORDER — ONDANSETRON 4 MG PO TBDP
4.0000 mg | ORAL_TABLET | Freq: Once | ORAL | Status: AC
  Administered 2021-06-22: 4 mg via ORAL
  Filled 2021-06-22: qty 1

## 2021-06-22 MED ORDER — ONDANSETRON 4 MG PO TBDP: 4.0000 mg | ORAL_TABLET | Freq: Three times a day (TID) | ORAL | 0 refills | Status: DC | PRN

## 2021-06-22 NOTE — ED Triage Notes (Signed)
Pt reports she is here today due to left flank pain, rectal bleeding since 10am this morning. Pt reports she was standing when the pain hit. Pt reports since this morning she also had N&V&D.  ?

## 2021-06-22 NOTE — ED Provider Notes (Signed)
? ?[facility] EMERGENCY DEPARTMENT  ?Provider Note ? ?CSN: [id number] ?Arrival date & time: 06/22/21 2032 ? ?History ?Chief Complaint  ?Patient presents with  ? Rectal Bleeding  ? Abdominal Pain  ? ? ?Megan Reid is a 56 y.o. female with history of GERD, reports onset of generalized abdominal cramping around 10am today while at work. She had waxing and waning pain during the day, stools were initially normal but progressed to watery, blood diarrhea this afternoon. She had some nausea and vomiting, non bloody. No fevers. No history of similar. Reportedly normal colonoscopy in Dec 2021. Pain and nausea improved with medications given in Triage. She is not on blood thinners.  ? ? ?Home Medications ?Prior to Admission medications   ?Medication Sig Start Date End Date Taking? Authorizing Provider  ?levofloxacin (LEVAQUIN) 500 MG tablet Take 1 tablet (500 mg total) by mouth daily for 3 days. 06/22/21 06/25/21 Yes Pollyann Savoy, MD  ?ondansetron (ZOFRAN-ODT) 4 MG disintegrating tablet Take 1 tablet (4 mg total) by mouth every 8 (eight) hours as needed for nausea or vomiting. 06/22/21  Yes Pollyann Savoy, MD  ?oxyCODONE-acetaminophen (PERCOCET/ROXICET) 5-325 MG tablet Take 1 tablet by mouth every 6 (six) hours as needed for severe pain. 06/22/21  Yes Pollyann Savoy, MD  ?albuterol (PROVENTIL HFA;VENTOLIN HFA) 108 (90 Base) MCG/ACT inhaler Inhale 2 puffs into the lungs 2 (two) times daily. For shortness of breath 04/15/18   Reubin Milan, MD  ?cyclobenzaprine (FLEXERIL) 5 MG tablet Take 1 tablet (5 mg total) by mouth at bedtime as needed for muscle spasms. 04/04/21   Nita Sickle K, DO  ?fluticasone Aleda Grana) 50 MCG/ACT nasal spray Apply 2 sprays per nostril daily 10/19/20   [name], FNP  ?hydrochlorothiazide (HYDRODIURIL) 25 MG tablet Take 25 mg by mouth every morning. 04/26/21   [provider]  ?montelukast (SINGULAIR) 10 MG tablet TAKE 1 TABLET(10 MG) BY MOUTH EVERY EVENING  06/11/19   Reubin Milan, MD  ?omalizumab Geoffry Paradise) 150 MG/ML prefilled syringe INJECT 2 SYRINGES UNDER THE SKIN EVERY 2 WEEKS. TOTAL DOSE =375MG  12/31/20   Mannam, Colbert Coyer, MD  ?omalizumab Geoffry Paradise) 75 MG/0.5ML prefilled syringe INJECT 75MG  INTO THE SKIN EVERY 14 DAYS IN ADDITION TO TWO 150MG  SYRINGES. (TOTAL DOSE: 375MG  EVERY 14 DAYS) 12/31/20   Mannam, Colbert Coyer, MD  ?pantoprazole (PROTONIX) 40 MG tablet Take 1 tablet (40 mg total) by mouth 2 (two) times daily. ?Patient taking differently: Take 40 mg by mouth daily. 08/25/19   Chilton Greathouse, MD  ?[place] 160-4.5 MCG/ACT inhaler INHALE 2 PUFFS INTO THE LUNGS TWICE DAILY 04/16/19   Reubin Milan, MD  ?Tiotropium Bromide Monohydrate (SPIRIVA RESPIMAT) 2.5 MCG/ACT AERS Inhale 2 puffs into the lungs daily. 08/06/20   Chilton Greathouse, MD  ? ? ? ?Allergies    ?Strawberry extract and Sulfa antibiotics ? ? ?Review of Systems   ?Review of Systems ?Please see HPI for pertinent positives and negatives ? ?Physical Exam ?BP 109/77   Pulse 78   Temp 98.4 ?F (36.9 ?C) (Oral)   Resp 18   LMP 05/22/2012   SpO2 100%  ? ?Physical Exam ?Vitals and nursing note reviewed.  ?Constitutional:   ?   Appearance: Normal appearance.  ?HENT:  ?   Head: Normocephalic and atraumatic.  ?   Nose: Nose normal.  ?   Mouth/Throat:  ?   Mouth: Mucous membranes are moist.  ?Eyes:  ?   Extraocular Movements: Extraocular movements intact.  ?   Conjunctiva/sclera:  Conjunctivae normal.  ?Cardiovascular:  ?   Rate and Rhythm: Normal rate.  ?Pulmonary:  ?   Effort: Pulmonary effort is normal.  ?   Breath sounds: Normal breath sounds.  ?Abdominal:  ?   General: Abdomen is flat.  ?   Palpations: Abdomen is soft.  ?   Tenderness: There is no abdominal tenderness. There is no guarding.  ?Musculoskeletal:     ?   General: No swelling. Normal range of motion.  ?   Cervical back: Neck supple.  ?Skin: ?   General: Skin is warm and dry.  ?Neurological:  ?   General: No focal deficit present.  ?   Mental  Status: She is alert.  ?Psychiatric:     ?   Mood and Affect: Mood normal.  ? ? ?ED Results / Procedures / Treatments   ?EKG ?None ? ?Procedures ?Procedures ? ?Medications Ordered in the ED ?Medications  ?levofloxacin (LEVAQUIN) tablet 500 mg (has no administration in time range)  ?oxyCODONE-acetaminophen (PERCOCET/ROXICET) 5-325 MG per tablet 1 tablet (has no administration in time range)  ?oxyCODONE-acetaminophen (PERCOCET/ROXICET) 5-325 MG per tablet 1 tablet (1 tablet Oral Given 06/22/21 2051)  ?ondansetron (ZOFRAN-ODT) disintegrating tablet 4 mg (4 mg Oral Given 06/22/21 2051)  ?iohexol (OMNIPAQUE) 300 MG/ML solution 100 mL (100 mLs Intravenous Contrast Given 06/22/21 2226)  ? ? ?Initial Impression and Plan ? Patient with colicky abdominal cramping, watery diarrhea. Labs done in triage show CBC without leukocytosis or anemia. CMP/Lipase are unremarkable. No significant abnormalities. I personally viewed the images from radiology studies and agree with radiologist interpretation: CT showing distal colitis as the likely cause of her symptoms. Doubt this is inflammatory or ischemic. Spoke with Dr. Levora AngelBrahmbhatt, GI, who recommend Antibiotics for presumed infectious colitis and follow up in their office.  ? ?ED Course  ? ?  ? ? ?MDM Rules/Calculators/A&P ?Medical Decision Making ?Given presenting complaint, I considered that admission might be necessary. After review of results from ED lab and/or imaging studies, admission to the hospital is not indicated at this time.  ? ? ?Problems Addressed: ?Infectious colitis: acute illness or injury ? ?Amount and/or Complexity of Data Reviewed ?Labs: ordered. Decision-making details documented in ED Course. ?Radiology: ordered and independent interpretation performed. Decision-making details documented in ED Course. ? ?Risk ?Prescription drug management. ?Decision regarding hospitalization. ? ? ? ?Final Clinical Impression(s) / ED Diagnoses ?Final diagnoses:  ?Infectious colitis   ? ? ?Rx / DC Orders ?ED Discharge Orders   ? ?      Ordered  ?  levofloxacin (LEVAQUIN) 500 MG tablet  Daily       ? 06/22/21 2355  ?  oxyCODONE-acetaminophen (PERCOCET/ROXICET) 5-325 MG tablet  Every 6 hours PRN       ? 06/22/21 2355  ?  ondansetron (ZOFRAN-ODT) 4 MG disintegrating tablet  Every 8 hours PRN       ? 06/22/21 2355  ? ?  ?  ? ?  ? ?  ?Pollyann SavoySheldon, Doll Frazee B, MD ?06/22/21 2356 ? ?

## 2021-06-22 NOTE — ED Provider Triage Note (Signed)
Emergency Medicine Provider Triage Evaluation Note ? ?Megan Reid , a 56 y.o. female  was evaluated in triage.  Pt complains of LLQ abdominal / Left flank pain that is sharp, stabbing since this morning. Also endorses bloody diarrhea since today. Feels chills. Hx Sjogrens. No hx abdominal surgeries, blood thinners. No change in diet. Some NV as well. No hematemesis. No dysuria. Does endorse hx of hemorrhoids. ? ?Review of Systems  ?Positive: Abd pain, NVD, blood in stool ?Negative: Chest pain, shob ? ?Physical Exam  ?BP (!) 123/97 (BP Location: Right Arm)   Pulse (!) 103   Temp 98.4 ?F (36.9 ?C) (Oral)   Resp (!) 21   LMP 05/22/2012   SpO2 99%  ?Gen:   Awake, no distress   ?Resp:  Normal effort  ?MSK:   Moves extremities without difficulty  ?Other:  TTP LLQ/Left flank, no rebound rigidity, guarding ? ?Medical Decision Making  ?Medically screening exam initiated at 8:47 PM.  Appropriate orders placed.  Megan Reid was informed that the remainder of the evaluation will be completed by another provider, this initial triage assessment does not replace that evaluation, and the importance of remaining in the ED until their evaluation is complete. ? ?Workup initiated ?  ?Anselmo Pickler, PA-C ?06/22/21 2049 ? ?

## 2021-06-23 DIAGNOSIS — A09 Infectious gastroenteritis and colitis, unspecified: Secondary | ICD-10-CM | POA: Diagnosis not present

## 2021-06-23 NOTE — ED Notes (Signed)
Discharge instructions reviewed with patient. Patient verbalized understanding of instructions. Follow-up care and medications were reviewed. Patient ambulatory with steady gait. VSS upon discharge.  ?

## 2021-06-26 NOTE — Progress Notes (Signed)
Follow Up Note  RE: Megan Reid MRN: [id number] DOB: 1966-02-24 Date of Office Visit: 06/27/2021  Referring provider: [name],* Primary care provider: [name], MD  Chief Complaint: Follow-up  History of Present Illness: I had the pleasure of seeing Megan Reid for a follow up visit at the [facility] on 06/27/2021. She is a 56 y.o. female, who is being followed for chronic coughing, asthma, allergic rhinitis, heartburn and leukopenia. Her previous allergy office visit was on 12/01/2020 with Dr. Selena Batten. Today is a regular follow up visit.  Chronic coughing Coughing unchanged. Patient saw ENT twice in [place] - saw a nodule on throat. No notes in EMR - asked patient to have ENT send records to [place].   She also got Covid-19 in November 2022 and not sure if coughing got worse or not.    Asthma Followed by pulmonologist.  Taking Xolair  every 2 weeks at home with no issues for about 1 year. Not sure if she wants to switch to [employer]. She said that injections in the office was very expensive and that's why she opted for the at home injections.  Still taking Symbicort 2 puffs twice a day and Spiriva 2 puffs once a day.  Usually takes albuterol once a week but last week had to use it twice - she had a bout of colitis.  Denies any ER/urgent care visits or prednisone use since the last visit. Patient lost her spacer.   Seasonal and perennial allergic rhinitis Currently taking Flonase 1 spray per nostril once a day as  it was causing some smell disturbance. No nosebleeds. Taking Singulair  daily.   Heartburn Taking Protonix  once a day with good benefit.    Leukopenia Follow with hematology/oncology.  05/04/2021 pulmonology visit: "Asthma Xolair has made improvement in frequency of exacerbations. But she continues to have symptoms of chronic cough, occasional dyspnea She is not interested in making the switch to  [employer]. She does not qualify for other biologics as her peripheral eosinophil cell count is low   Continue Symbicort, Singulair and Spiriva   Allergic rhinitis, GERD Has significant issues with rhinitis, GERD Continue Protonix once a day.  Does not want to use twice a day for fear of side effects I will asked her to resume Flonase, she will use chlorpheniramine over-the-counter for postnasal drip   She has been treated already referred by allergy for ENT referral to evaluate chronic cough   Autoimmune disease, arthritis, Sjogren's syndrome Previously followed by Dr. Corliss Skains, rheumatology and at [facility] rheumatology since she has not been seen since 2019 Exposure history notable for down comforters.  She has gotten rid of them earlier in 2020 No evidence of ILD on high-res CT   She has raised the concern for sarcoidosis today but I do not see any evidence of this as his CT scan is not typical.  She did have borderline lymph nodes in the past but PET scan in 2015 was negative.  More recent CTs do not show any lymphadenopathy   Plan/Recommendations: - Continue inhalers, Singulair, Xolair"  Assessment and Plan: Megan Reid is a 56 y.o. female with: Seasonal and perennial allergic rhinitis Past history - Perennial rhinitis symptoms for 2 years with worsening in the winter.  Tried Flonase, SingulairHENRITTA MUTZwith some benefit.  Testing over 10 years ago was positive to grass per patient report.  No prior allergy immunotherapy. Elevated IgE level - repeat higher but drawn while on Xolair.  2022 skin testing showed: Negative to indoor/outdoor allergens. 2022 bloodwork positive to dust mites. Interim history - saw ENT once but had to cancel follow up due to Covid-19 infection. Unable to tolerate full dose Flonase.  Continue environmental control measures.  Use Flonase (fluticasone) nasal spray 1 spray per nostril once a day as needed for nasal congestion.  Continue Singulair (montelukast)  daily  at night. No antihistamines due to patient's Sjogren's disease.   Heartburn No worsening symptoms with once a day PPI. Continue Protonix  once a day and nothing to eat/drink for 30 minutes afterwards. Continue lifestyle and dietary modifications as below.  Chronic coughing Past history - Medical history significant for Sjogren's, asthma, reflux, and allergic rhinitis.  Interim history - unchanged, had Covid-19 in November 2022, saw ENT once - ? Nodule on throat? Not sure what's causing the coughing - most likely multifactorial.  Follow up with pulmonologist as scheduled. Follow up with your ENT - regarding vocal cords.  Recommend referral to speech therapy for coughing.   Asthma Past history - Managed by pulmonology and on Xolair  every 2 weeks. Interim history - unchanged.  Today's spirometry showed moderate obstruction.  Continue follow up with pulmonology. Daily controller medication(s): Symbicort 2 puffs twice a day with spacer and rinse mouth afterwards. Continue Spiriva 2.75mcg 2 puffs once a day. Continue Xolair  every 2 week injections as per pulmonology.  Think about switching to [employer] - prefers at home injection.  May use albuterol rescue inhaler 2 puffs every 4 to 6 hours as needed for shortness of breath, chest tightness, coughing, and wheezing. May use albuterol rescue inhaler 2 puffs 5 to 15 minutes prior to strenuous physical activities. Monitor frequency of use.   Return in about 6 months (around 12/28/2021).  Meds ordered this encounter  Medications   montelukast (SINGULAIR) 10 MG tablet    Sig: Take 1 tablet (10 mg total) by mouth at bedtime.    Dispense:  30 tablet    Refill:  5   fluticasone (FLONASE) 50 MCG/ACT nasal spray    Sig: Place 1 spray into both nostrils 2 (two) times daily as needed (nasal congestion).    Dispense:  16 g    Refill:  5   Spacer/Aero-Holding Chambers (AEROCHAMBER PLUS) inhaler    Sig: Use as instructed     Dispense:  2 each    Refill:  2   pantoprazole (PROTONIX) 40 MG tablet    Sig: Take 1 tablet (40 mg total) by mouth daily.    Dispense:  30 tablet    Refill:  5   Lab Orders  No laboratory test(s) ordered today    Diagnostics: Spirometry:  Tracings reviewed. Her effort: Good reproducible efforts. FVC: 2.87L FEV1: 1.84L, 68% predicted FEV1/FVC ratio: 64% Interpretation: Spirometry consistent with moderate obstructive disease.  Please see scanned spirometry results for details.  Medication List:  Current Outpatient Medications  Medication Sig Dispense Refill   albuterol (PROVENTIL HFA;VENTOLIN HFA) 108 (90 Base) MCG/ACT inhaler Inhale 2 puffs into the lungs 2 (two) times daily. For shortness of breath 18 g 0   cyclobenzaprine (FLEXERIL) 5 MG tablet Take 1 tablet (5 mg total) by mouth at bedtime as needed for muscle spasms. 30 tablet 1   fluticasone (FLONASE) 50 MCG/ACT nasal spray Place 1 spray into both nostrils 2 (two) times daily as needed (nasal congestion). 16 g 5   hydrochlorothiazide (HYDRODIURIL) 25 MG tablet Take 25 mg by mouth every morning.  montelukast (SINGULAIR) 10 MG tablet Take 1 tablet (10 mg total) by mouth at bedtime. 30 tablet 5   omalizumab (XOLAIR) 150 MG/ML prefilled syringe INJECT 2 SYRINGES UNDER THE SKIN EVERY 2 WEEKS. TOTAL DOSE =375MG  4 mL 5   omalizumab (XOLAIR) 75 MG/0.5ML prefilled syringe INJECT  INTO THE SKIN EVERY 14 DAYS IN ADDITION TO TWO  SYRINGES. (TOTAL DOSE:  EVERY 14 DAYS) 2 mL 5   ondansetron (ZOFRAN-ODT) 4 MG disintegrating tablet Take 1 tablet (4 mg total) by mouth every 8 (eight) hours as needed for nausea or vomiting. 20 tablet 0   pantoprazole (PROTONIX) 40 MG tablet Take 1 tablet (40 mg total) by mouth daily. 30 tablet 5   Spacer/Aero-Holding Chambers (AEROCHAMBER PLUS) inhaler Use as instructed 2 each 2   SYMBICORT 160-4.5 MCG/ACT inhaler INHALE 2 PUFFS INTO THE LUNGS TWICE DAILY 10.2 g 5   Tiotropium Bromide  Monohydrate (SPIRIVA RESPIMAT) 2.5 MCG/ACT AERS Inhale 2 puffs into the lungs daily. 4 g 5   No current facility-administered medications for this visit.   Allergies: Allergies  Allergen Reactions   Sulfa Antibiotics Anaphylaxis, Hives and Swelling    Swelling of the face and tongue    I reviewed her past medical history, social history, family history, and environmental history and no significant changes have been reported from her previous visit.  Review of Systems  Constitutional:  Negative for appetite change, fever and unexpected weight change.  HENT:  Positive for rhinorrhea. Negative for congestion.   Eyes:  Negative for itching.  Respiratory:  Positive for cough. Negative for chest tightness, shortness of breath and wheezing.   Skin:  Negative for rash.  Allergic/Immunologic: Positive for environmental allergies.  Neurological:  Negative for headaches.   Objective: BP 96/60 (BP Location: Left Arm, Patient Position: Sitting, Cuff Size: Normal)    Pulse (!) 113    Temp 97.8 F (36.6 C) (Temporal)    Resp 18    LMP 05/22/2012    SpO2 100%  There is no height or weight on file to calculate BMI. Physical Exam Vitals and nursing note reviewed.  Constitutional:      Appearance: Normal appearance. She is well-developed.  HENT:     Head: Normocephalic and atraumatic.     Right Ear: Tympanic membrane and external ear normal.     Left Ear: Tympanic membrane and external ear normal.     Nose: Nose normal.     Mouth/Throat:     Mouth: Mucous membranes are dry.     Pharynx: Oropharynx is clear.  Eyes:     Conjunctiva/sclera: Conjunctivae normal.  Cardiovascular:     Rate and Rhythm: Normal rate and regular rhythm.     Heart sounds: Normal heart sounds. No murmur heard. Pulmonary:     Effort: Pulmonary effort is normal.     Breath sounds: Normal breath sounds. No wheezing, rhonchi or rales.  Musculoskeletal:     Cervical back: Neck supple.  Skin:    General: Skin is warm.      Findings: No rash.  Neurological:     Mental Status: She is alert and oriented to person, place, and time.  Psychiatric:        Behavior: Behavior normal.   Previous notes and tests were reviewed. The plan was reviewed with the patient/family, and all questions/concerned were addressed.  It was my pleasure to see Megan Reid today and participate in her care. Please feel free to contact me with any questions or concerns.  Sincerely,  Rexene Alberts, DO Allergy & Immunology  [facility] office: [place] office: [phone]

## 2021-06-27 ENCOUNTER — Encounter: Payer: Self-pay | Admitting: Allergy

## 2021-06-27 ENCOUNTER — Other Ambulatory Visit: Payer: Self-pay

## 2021-06-27 ENCOUNTER — Ambulatory Visit (INDEPENDENT_AMBULATORY_CARE_PROVIDER_SITE_OTHER): Payer: BC Managed Care – PPO | Admitting: Allergy

## 2021-06-27 VITALS — BP 96/60 | HR 113 | Temp 97.8°F | Resp 18

## 2021-06-27 DIAGNOSIS — J302 Other seasonal allergic rhinitis: Secondary | ICD-10-CM

## 2021-06-27 DIAGNOSIS — J3089 Other allergic rhinitis: Secondary | ICD-10-CM | POA: Diagnosis not present

## 2021-06-27 DIAGNOSIS — R053 Chronic cough: Secondary | ICD-10-CM | POA: Diagnosis not present

## 2021-06-27 DIAGNOSIS — R12 Heartburn: Secondary | ICD-10-CM

## 2021-06-27 DIAGNOSIS — J4550 Severe persistent asthma, uncomplicated: Secondary | ICD-10-CM

## 2021-06-27 MED ORDER — AEROCHAMBER PLUS MISC: 2 refills | Status: DC

## 2021-06-27 MED ORDER — MONTELUKAST SODIUM 10 MG PO TABS: 10.0000 mg | ORAL_TABLET | Freq: Every day | ORAL | 5 refills | Status: DC

## 2021-06-27 MED ORDER — PANTOPRAZOLE SODIUM 40 MG PO TBEC: 40.0000 mg | DELAYED_RELEASE_TABLET | Freq: Every day | ORAL | 5 refills | Status: DC

## 2021-06-27 MED ORDER — FLUTICASONE PROPIONATE 50 MCG/ACT NA SUSP: 1.0000 | Freq: Two times a day (BID) | NASAL | 5 refills | Status: DC | PRN

## 2021-06-27 NOTE — Assessment & Plan Note (Addendum)
Past history - Perennial rhinitis symptoms for 2 years with worsening in the winter.  Tried Flonase, Singulair and Zyrtec with some benefit.  Testing over 10 years ago was positive to grass per patient report.  No prior allergy immunotherapy. Elevated IgE level - repeat higher but drawn while on Xolair. 2022 skin testing showed: Negative to indoor/outdoor allergens. 2022 bloodwork positive to dust mites. ?Interim history - saw ENT once but had to cancel follow up due to Covid-19 infection. Unable to tolerate full dose Flonase.  ?? Continue environmental control measures.  ?? Use Flonase (fluticasone) nasal spray 1 spray per nostril once a day as needed for nasal congestion.  ?? Continue Singulair (montelukast) 10mg  daily at night. ?? No antihistamines due to patient's Sjogren's disease.  ?

## 2021-06-27 NOTE — Assessment & Plan Note (Addendum)
Past history - Managed by pulmonology and on Xolair 375mg  every 2 weeks. ?Interim history - unchanged.  ?? Today's spirometry showed moderate obstruction.  ?? Continue follow up with pulmonology. ?? Daily controller medication(s): Symbicort 2 puffs twice a day with spacer and rinse mouth afterwards. ?o Continue Spiriva 2.91mcg 2 puffs once a day. ?? Continue Xolair 375mg  every 2 week injections as per pulmonology.  ?? Think about switching to [employer] - prefers at home injection.  ?? May use albuterol rescue inhaler 2 puffs every 4 to 6 hours as needed for shortness of breath, chest tightness, coughing, and wheezing. May use albuterol rescue inhaler 2 puffs 5 to 15 minutes prior to strenuous physical activities. Monitor frequency of use.  ?

## 2021-06-27 NOTE — Assessment & Plan Note (Signed)
No worsening symptoms with once a day PPI. ?? Continue Protonix 40mg  once a day and nothing to eat/drink for 30 minutes afterwards. ?? Continue lifestyle and dietary modifications as below. ?

## 2021-06-27 NOTE — Patient Instructions (Addendum)
Coughing  ?Not sure what's causing your coughing.   ?Follow up with pulmonologist as scheduled. ?Follow up with your ENT - regarding vocal cords.  ?Recommend referral to speech therapy for coughing.  ? ?Asthma: ?Continue follow up with pulmonology. ?Daily controller medication(s): Symbicort 160mcg 2 puffs twice a day with spacer and rinse mouth afterwards. ?Continue Spiriva 2.145mcg 2 puffs once a day. ?Continue Xolair injections as per pulmonology.  ?Think about switching to [employer].  ?May use albuterol rescue inhaler 2 puffs every 4 to 6 hours as needed for shortness of breath, chest tightness, coughing, and wheezing. May use albuterol rescue inhaler 2 puffs 5 to 15 minutes prior to strenuous physical activities. Monitor frequency of use.  ?Asthma control goals:  ?Full participation in all desired activities (may need albuterol before activity) ?Albuterol use two times or less a week on average (not counting use with activity) ?Cough interfering with sleep two times or less a month ?Oral steroids no more than once a year ?No hospitalizations ? ?Reflux: ?Continue Protonix 40mg  once a day and nothing to eat/drink for 30 minutes afterwards. ?Continue lifestyle and dietary modifications as below. ? ?Rhinitis: ?Continue environmental control measures.  ?Use Flonase (fluticasone) nasal spray 1 spray per nostril once a day as needed for nasal congestion.  ?Continue Singulair (montelukast) 10mg  daily at night. ?No antihistamines due to patient's Sjogren's disease.  ? ?Follow up in 6 months or sooner if needed.  ? ?Control of House Dust Mite Allergen ?Dust mite allergens are a common trigger of allergy and asthma symptoms. While they can be found throughout the house, these microscopic creatures thrive in warm, humid environments such as bedding, upholstered furniture and carpeting. ?Because so much time is spent in the bedroom, it is essential to reduce mite levels there.  ?Encase pillows, mattresses, and box springs in  special allergen-proof fabric covers or airtight, zippered plastic covers.  ?Bedding should be washed weekly in hot water (130? F) and dried in a hot dryer. Allergen-proof covers are available for comforters and pillows that can?t be regularly washed.  ?Wash the allergy-proof covers every few months. Minimize clutter in the bedroom. Keep pets out of the bedroom.  ?Keep humidity less than 50% by using a dehumidifier or air conditioning. You can buy a humidity measuring device called a hygrometer to monitor this.  ?If possible, replace carpets with hardwood, linoleum, or washable area rugs. If that's not possible, vacuum frequently with a vacuum that has a HEPA filter. ?Remove all upholstered furniture and non-washable window drapes from the bedroom. ?Remove all non-washable stuffed toys from the bedroom.  Wash stuffed toys weekly. ? ?Pet Allergen Avoidance: ?Contrary to popular opinion, there are no ?hypoallergenic? breeds of dogs or cats. That is because people are not allergic to an animal?s hair, but to an allergen found in the animal's saliva, dander (dead skin flakes) or urine. Pet allergy symptoms typically occur within minutes. For some people, symptoms can build up and become most severe 8 to 12 hours after contact with the animal. People with severe allergies can experience reactions in public places if dander has been transported on the pet owners? clothing. ?Keeping an animal outdoors is only a partial solution, since homes with pets in the yard still have higher concentrations of animal allergens. ?Before getting a pet, ask your allergist to determine if you are allergic to animals. If your pet is already considered part of your family, try to minimize contact and keep the pet out of the bedroom and other  rooms where you spend a great deal of time. ?As with dust mites, vacuum carpets often or replace carpet with a hardwood floor, tile or linoleum. ?High-efficiency particulate air (HEPA) cleaners can  reduce allergen levels over time. ?While dander and saliva are the source of cat and dog allergens, urine is the source of allergens from rabbits, hamsters, mice and [place] pigs; so ask a non-allergic family member to clean the animal?s cage. ?If you have a pet allergy, talk to your allergist about the potential for allergy immunotherapy (allergy shots). This strategy can often provide long-term relief. ?

## 2021-06-27 NOTE — Assessment & Plan Note (Signed)
Past history - Medical history significant for Sjogren's, asthma, reflux, and allergic rhinitis.  ?Interim history - unchanged, had Covid-19 in November 2022, saw ENT once - ? Nodule on throat? ?? Not sure what's causing the coughing - most likely multifactorial.  ?? Follow up with pulmonologist as scheduled. ?? Follow up with your ENT - regarding vocal cords.  ?o Recommend referral to speech therapy for coughing.  ?

## 2021-06-29 ENCOUNTER — Ambulatory Visit: Payer: BC Managed Care – PPO | Admitting: Allergy

## 2021-07-05 ENCOUNTER — Emergency Department (HOSPITAL_COMMUNITY): Payer: BC Managed Care – PPO

## 2021-07-05 ENCOUNTER — Other Ambulatory Visit: Payer: Self-pay

## 2021-07-05 ENCOUNTER — Encounter (HOSPITAL_COMMUNITY): Payer: Self-pay

## 2021-07-05 ENCOUNTER — Emergency Department (HOSPITAL_COMMUNITY)
Admission: EM | Admit: 2021-07-05 | Discharge: 2021-07-06 | Disposition: A | Discharge status: Home / Self Care | Payer: BC Managed Care – PPO | Attending: Emergency Medicine | Admitting: Emergency Medicine

## 2021-07-05 DIAGNOSIS — Z20822 Contact with and (suspected) exposure to covid-19: Secondary | ICD-10-CM | POA: Diagnosis not present

## 2021-07-05 DIAGNOSIS — K5900 Constipation, unspecified: Secondary | ICD-10-CM | POA: Diagnosis not present

## 2021-07-05 DIAGNOSIS — R1012 Left upper quadrant pain: Secondary | ICD-10-CM | POA: Diagnosis present

## 2021-07-05 DIAGNOSIS — Z79899 Other long term (current) drug therapy: Secondary | ICD-10-CM | POA: Insufficient documentation

## 2021-07-05 DIAGNOSIS — R1032 Left lower quadrant pain: Secondary | ICD-10-CM

## 2021-07-05 LAB — URINALYSIS, ROUTINE W REFLEX MICROSCOPIC
Bilirubin Urine: NEGATIVE
Glucose, UA: NEGATIVE mg/dL
Hgb urine dipstick: NEGATIVE
Ketones, ur: NEGATIVE mg/dL
Leukocytes,Ua: NEGATIVE
Nitrite: NEGATIVE
Protein, ur: NEGATIVE mg/dL
Specific Gravity, Urine: 1.017 (ref 1.005–1.030)
pH: 7 (ref 5.0–8.0)

## 2021-07-05 LAB — CBC WITH DIFFERENTIAL/PLATELET
Abs Immature Granulocytes: 0.01 10*3/uL (ref 0.00–0.07)
Basophils Absolute: 0 10*3/uL (ref 0.0–0.1)
Basophils Relative: 1 %
Eosinophils Absolute: 0 10*3/uL (ref 0.0–0.5)
Eosinophils Relative: 0 %
HCT: 36.2 % (ref 36.0–46.0)
Hemoglobin: 11.6 g/dL — ABNORMAL LOW (ref 12.0–15.0)
Immature Granulocytes: 0 %
Lymphocytes Relative: 24 %
Lymphs Abs: 1 10*3/uL (ref 0.7–4.0)
MCH: 27.6 pg (ref 26.0–34.0)
MCHC: 32 g/dL (ref 30.0–36.0)
MCV: 86.2 fL (ref 80.0–100.0)
Monocytes Absolute: 0.3 10*3/uL (ref 0.1–1.0)
Monocytes Relative: 8 %
Neutro Abs: 2.7 10*3/uL (ref 1.7–7.7)
Neutrophils Relative %: 67 %
Platelets: 121 10*3/uL — ABNORMAL LOW (ref 150–400)
RBC: 4.2 MIL/uL (ref 3.87–5.11)
RDW: 13.3 % (ref 11.5–15.5)
WBC: 4 10*3/uL (ref 4.0–10.5)
nRBC: 0 % (ref 0.0–0.2)

## 2021-07-05 LAB — COMPREHENSIVE METABOLIC PANEL
ALT: 18 U/L (ref 0–44)
AST: 34 U/L (ref 15–41)
Albumin: 3 g/dL — ABNORMAL LOW (ref 3.5–5.0)
Alkaline Phosphatase: 40 U/L (ref 38–126)
Anion gap: 8 (ref 5–15)
BUN: 17 mg/dL (ref 6–20)
CO2: 26 mmol/L (ref 22–32)
Calcium: 9.5 mg/dL (ref 8.9–10.3)
Chloride: 96 mmol/L — ABNORMAL LOW (ref 98–111)
Creatinine, Ser: 1.23 mg/dL — ABNORMAL HIGH (ref 0.44–1.00)
GFR, Estimated: 52 mL/min — ABNORMAL LOW (ref >60)
Glucose, Bld: 107 mg/dL — ABNORMAL HIGH (ref 70–99)
Potassium: 3.5 mmol/L (ref 3.5–5.1)
Sodium: 130 mmol/L — ABNORMAL LOW (ref 135–145)
Total Bilirubin: 0.5 mg/dL (ref 0.3–1.2)
Total Protein: 10 g/dL — ABNORMAL HIGH (ref 6.5–8.1)

## 2021-07-05 LAB — RESP PANEL BY RT-PCR (FLU A&B, COVID) ARPGX2
Influenza A by PCR: NEGATIVE
Influenza B by PCR: NEGATIVE
SARS Coronavirus 2 by RT PCR: NEGATIVE

## 2021-07-05 LAB — LIPASE, BLOOD: Lipase: 50 U/L (ref 11–51)

## 2021-07-05 LAB — LACTIC ACID, PLASMA: Lactic Acid, Venous: 0.7 mmol/L (ref 0.5–1.9)

## 2021-07-05 MED ORDER — ACETAMINOPHEN 325 MG PO TABS
650.0000 mg | ORAL_TABLET | Freq: Once | ORAL | Status: AC
  Administered 2021-07-05: 650 mg via ORAL
  Filled 2021-07-05: qty 2

## 2021-07-05 MED ORDER — IOHEXOL 300 MG/ML  SOLN
100.0000 mL | Freq: Once | INTRAMUSCULAR | Status: AC | PRN
  Administered 2021-07-05: 100 mL via INTRAVENOUS

## 2021-07-05 NOTE — ED Triage Notes (Signed)
Pt reports she was diagnosed with colitis 2 weeks ago and states she has not had a BM in 7 days. She also reports left side abd and flank pain associated with chills and joint pain to knees and elbows. ?

## 2021-07-05 NOTE — ED Provider Triage Note (Signed)
Emergency Medicine Provider Triage Evaluation Note ? ?Megan Reid , a 56 y.o. female  was evaluated in triage.  Pt complains of constipation and abdominal pain. States that she has not had a bowel movement in 10 days. Presented to the ER for abdominal pain on 3/8 and was diagnosed with colitis and given levaquin for same. States she took this as prescribed. She has taken several medications OTC for her constipation without success. She endorses associated nausea without vomiting. Also notes that she has been more congested and short of breath over the past few days. Denies any dysuria. ? ?Review of Systems  ?Positive:  ?Negative: See above ? ?Physical Exam  ?BP 130/84 (BP Location: Left Arm)   Pulse (!) 116   Temp (!) 101.3 ?F (38.5 ?C) (Oral)   Resp (!) 22   Ht 5\' 10"  (1.778 m)   Wt 71.7 kg   LMP 05/22/2012   SpO2 98%   BMI 22.67 kg/m?  ?Gen:   Awake, no distress   ?Resp:  Normal effort  ?MSK:   Moves extremities without difficulty  ?Other:   ? ?Medical Decision Making  ?Medically screening exam initiated at 7:26 PM.  Appropriate orders placed.  Megan Reid was informed that the remainder of the evaluation will be completed by another provider, this initial triage assessment does not replace that evaluation, and the importance of remaining in the ED until their evaluation is complete. ? ?Patient febrile, tachycardic, and tachypneic ?  ?Silva Bandy, PA-C ?07/05/21 1931 ? ?

## 2021-07-06 DIAGNOSIS — K5900 Constipation, unspecified: Secondary | ICD-10-CM | POA: Diagnosis not present

## 2021-07-06 MED ORDER — DOCUSATE SODIUM 100 MG PO CAPS: 100.0000 mg | ORAL_CAPSULE | Freq: Two times a day (BID) | ORAL | 0 refills | Status: DC

## 2021-07-06 MED ORDER — SODIUM CHLORIDE 0.9 % IV BOLUS
500.0000 mL | Freq: Once | INTRAVENOUS | Status: AC
  Administered 2021-07-06: 500 mL via INTRAVENOUS

## 2021-07-06 MED ORDER — ONDANSETRON HCL 4 MG/2ML IJ SOLN
4.0000 mg | Freq: Once | INTRAMUSCULAR | Status: AC
  Administered 2021-07-06: 4 mg via INTRAVENOUS
  Filled 2021-07-06: qty 2

## 2021-07-06 MED ORDER — GLYCERIN (ADULT) 2 G RE SUPP: 1.0000 | RECTAL | 0 refills | Status: DC | PRN

## 2021-07-06 MED ORDER — POLYETHYLENE GLYCOL 3350 17 GM/SCOOP PO POWD: 17.0000 g | Freq: Two times a day (BID) | ORAL | 1 refills | Status: DC

## 2021-07-06 MED ORDER — MORPHINE SULFATE (PF) 4 MG/ML IV SOLN
4.0000 mg | Freq: Once | INTRAVENOUS | Status: AC
  Administered 2021-07-06: 4 mg via INTRAVENOUS
  Filled 2021-07-06: qty 1

## 2021-07-06 NOTE — ED Provider Notes (Signed)
?[facility] EMERGENCY DEPARTMENT ?Provider Note ? ? ?CSN: [id number] ?Arrival date & time: 07/05/21  1836 ? ?  ? ?History ? ?Chief Complaint  ?Patient presents with  ? Constipation  ? ? ?Megan Reid is a 56 y.o. female. ? ?Patient with history of thrombocytopenia, leukopenia, ED visit on 06/22/2020 and diagnosed with colitis, given a course of Levaquin which she has completed --presents to the emergency department for ongoing constipation.  She states that she has not had a good bowel movement since being diagnosed with colitis.  She has tried Metamucil at home without improvement.  She is starting to develop intermittent left-sided abdominal pain, and she was concerned that she could be having recurrent symptoms.  She also reports joint pains which are a chronic problem for her but has been more pronounced in the left lower posterior hip.  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  No reported fever but was febrile on arrival to the emergency department.  No vomiting.  She states that her appetite has returned normal.  No chest pain or shortness of breath, significant cough.  No dysuria, increased frequency or urgency, hematuria.  No skin rashes, redness or warmth.  She is established with GI and states she has tried calling the office but has not yet been able to establish an appointment for follow-up. ? ? ?  ? ?Home Medications ?Prior to Admission medications   ?Medication Sig Start Date End Date Taking? Authorizing Provider  ?albuterol (PROVENTIL HFA;VENTOLIN HFA) 108 (90 Base) MCG/ACT inhaler Inhale 2 puffs into the lungs 2 (two) times daily. For shortness of breath 04/15/18   Reubin Milan, MD  ?cyclobenzaprine (FLEXERIL) 5 MG tablet Take 1 tablet (5 mg total) by mouth at bedtime as needed for muscle spasms. 04/04/21   Nita Sickle K, DO  ?fluticasone (FLONASE) 50 MCG/ACT nasal spray Place 1 spray into both  nostrils 2 (two) times daily as needed (nasal congestion). 06/27/21   Ellamae Sia, DO  ?hydrochlorothiazide (HYDRODIURIL) 25 MG tablet Take 25 mg by mouth every morning. 04/26/21   [provider]  ?montelukast (SINGULAIR) 10 MG tablet Take 1 tablet (10 mg total) by mouth at bedtime. 06/27/21   Ellamae Sia, DO  ?omalizumab Geoffry Paradise) 150 MG/ML prefilled syringe INJECT 2 SYRINGES UNDER THE SKIN EVERY 2 WEEKS. TOTAL DOSE =375MG  12/31/20   Mannam, Colbert Coyer, MD  ?omalizumab Geoffry Paradise) 75 MG/0.5ML prefilled syringe INJECT 75MG  INTO THE SKIN EVERY 14 DAYS IN ADDITION TO TWO 150MG  SYRINGES. (TOTAL DOSE: 375MG  EVERY 14 DAYS) 12/31/20   Mannam, Colbert Coyer, MD  ?ondansetron (ZOFRAN-ODT) 4 MG disintegrating tablet Take 1 tablet (4 mg total) by mouth every 8 (eight) hours as needed for nausea or vomiting. 06/22/21   Pollyann Savoy, MD  ?pantoprazole (PROTONIX) 40 MG tablet Take 1 tablet (40 mg total) by mouth daily. 06/27/21   Ellamae Sia, DO  ?Spacer/Aero-Holding Chambers (AEROCHAMBER PLUS) inhaler Use as instructed 06/27/21   Ellamae Sia, DO  ?SYMBICORT 160-4.5 MCG/ACT inhaler INHALE 2 PUFFS INTO THE LUNGS TWICE DAILY 04/16/19   Reubin Milan, MD  ?Tiotropium Bromide Monohydrate (SPIRIVA RESPIMAT) 2.5 MCG/ACT AERS Inhale 2 puffs into the lungs daily. 08/06/20   Chilton Greathouse, MD  ?   ? ?Allergies    ?Sulfa antibiotics   ? ?Review of Systems   ?Review of Systems ? ?Physical Exam ?Updated Vital Signs ?BP 126/84  Pulse (!) 102   Temp 98.7 ?F (37.1 ?C) (Oral)   Resp 20   Ht  (1.778 m)   Wt 71.7 kg   LMP 05/22/2012   SpO2 96%   BMI 22.67 kg/m?  ?Physical Exam ?Vitals and nursing note reviewed.  ?Constitutional:   ?   General: She is not in acute distress. ?   Appearance: She is well-developed.  ?HENT:  ?   Head: Normocephalic and atraumatic.  ?   Right Ear: External ear normal.  ?   Left Ear: External ear normal.  ?   Nose: Nose normal.  ?Eyes:  ?   Conjunctiva/sclera: Conjunctivae normal.  ?Cardiovascular:  ?    Rate and Rhythm: Normal rate and regular rhythm.  ?   Heart sounds: No murmur heard. ?Pulmonary:  ?   Effort: No respiratory distress.  ?   Breath sounds: No wheezing, rhonchi or rales.  ?Abdominal:  ?   Palpations: Abdomen is soft.  ?   Tenderness: There is abdominal tenderness. There is no guarding or rebound.  ?   Comments: Mild left upper quadrant tenderness, no rebound or guarding.  ?Musculoskeletal:  ?   Cervical back: Normal range of motion and neck supple.  ?   Right lower leg: No edema.  ?   Left lower leg: No edema.  ?   Comments: Patient with tenderness to palpation without significant warmth or redness of the left posterior hip area.  No abscess palpated or induration.  ?Skin: ?   General: Skin is warm and dry.  ?   Findings: No rash.  ?Neurological:  ?   General: No focal deficit present.  ?   Mental Status: She is alert. Mental status is at baseline.  ?   Motor: No weakness.  ?Psychiatric:     ?   Mood and Affect: Mood normal.  ? ? ?ED Results / Procedures / Treatments   ?Labs ?(all labs ordered are listed, but only abnormal results are displayed) ?Labs Reviewed  ?CBC WITH DIFFERENTIAL/PLATELET - Abnormal; Notable for the following components:  ?    Result Value  ? Hemoglobin 11.6 (*)   ? Platelets 121 (*)   ? All other components within normal limits  ?COMPREHENSIVE METABOLIC PANEL - Abnormal; Notable for the following components:  ? Sodium 130 (*)   ? Chloride 96 (*)   ? Glucose, Bld 107 (*)   ? Creatinine, Ser 1.23 (*)   ? Total Protein 10.0 (*)   ? Albumin 3.0 (*)   ? GFR, Estimated 52 (*)   ? All other components within normal limits  ?RESP PANEL BY RT-PCR (FLU A&B, COVID) [id number]  ?LACTIC ACID, PLASMA  ?LIPASE, BLOOD  ?URINALYSIS, ROUTINE W REFLEX MICROSCOPIC  ? ? ?EKG ?None ? ?Radiology ?DG Chest 2 View ? ?Result Date: 07/05/2021 ?CLINICAL DATA:  Shortness of breath. EXAM: CHEST - 2 VIEW COMPARISON:  Radiograph 12/04/2019, chest CT 03/17/2021 FINDINGS: The heart is normal in size with normal  mediastinal contours. Chronic peribronchial and interstitial thickening but increased from prior exam. There is no confluent airspace disease. Mild chronic elevation of left hemidiaphragm. No pneumothorax or pleural effusion. No pulmonary edema. No acute osseous findings. IMPRESSION: Chronic peribronchial and interstitial thickening, increased from prior exam, may be acute bronchitis or atypical infection. Electronically Signed   By: Narda Rutherford M.D.   On: 07/05/2021 20:17  ? ?CT ABDOMEN PELVIS W CONTRAST ? ?Result Date: 07/05/2021 ?CLINICAL DATA:  Nonlocalized abdominal pain. Left-sided pain. Colitis  diagnosis 2 weeks ago. No bowel movement in 7 days. EXAM: CT ABDOMEN AND PELVIS WITH CONTRAST TECHNIQUE: Multidetector CT imaging of the abdomen and pelvis was performed using the standard protocol following bolus administration of intravenous contrast. RADIATION DOSE REDUCTION: This exam was performed according to the departmental dose-optimization program which includes automated exposure control, adjustment of the mA and/or kV according to patient size and/or use of iterative reconstruction technique. CONTRAST:  100mL OMNIPAQUE IOHEXOL 300 MG/ML  SOLN COMPARISON:  CT 06/22/2021. FINDINGS: Lower chest: No acute findings. Hepatobiliary: Tiny hypodensity in the right hepatic dome is too small to characterize. There is an additional tiny subcapsular hypodensity adjacent to the gallbladder fossa that is too small to characterize. No suspicious liver lesion Gallbladder physiologically distended, no calcified stone. No biliary dilatation. Pancreas: No ductal dilatation or inflammation. Spleen: Normal in size without focal abnormality. Adrenals/Urinary Tract: Normal adrenal glands. Nonobstructing stone in the mid left kidney. No hydronephrosis. No perinephric edema. Homogeneous renal enhancement with symmetric excretion on delayed phase imaging. Partially distended unremarkable bladder. Stomach/Bowel: Decompressed  stomach. No small bowel obstruction or inflammation. Moderate to large volume of stool throughout the colon. Previous colonic wall thickening has resolved. There is no abnormal rectal distention. No bowel pneumatosis. N

## 2021-07-06 NOTE — Discharge Instructions (Addendum)
Please read and follow all provided instructions. ? ?Your diagnoses today include:  ?1. Left lower quadrant abdominal pain   ?2. Constipation, unspecified constipation type   ? ? ?Tests performed today include: ?Complete blood cell count: platelets low, normal white blood cells ?Complete metabolic panel: low sodium ?Urinalysis (urine test): No urine infection ?CT scan of your abdomen and pelvis: Shows improvement in previous colitis, shows a large stool burden suggestive of constipation ?Vital signs. See below for your results today.  ? ?Medications prescribed:  ?Miralax - laxative ? ?This medication can be found over-the-counter.  ? ?Colace - stool softener ? ?This medication can be found over-the-counter.  ? ?Continue colace for 2 weeks after your stools return to normal to prevent constipation.  ? ?Glycerin suppository ? ?You may also choose to purchase an over-the-counter enema and administer this. ? ?Take any medications only as directed on prescription or on packaging.  ? ?Home care instructions:  ?Follow any educational materials contained in this packet. ? ?Follow-up instructions: ?Please follow-up with your primary care provider/gastroenterologist in the next week for further evaluation of your symptoms.  ? ?Return instructions:  ?Please return to the Emergency Department if you experience worsening symptoms.  ?Please return if you have worsening abdominal pain, swelling of your abdomen, persistent vomiting, blood in your stool or vomit, or fever.  ?Please return if you have any other emergent concerns. ? ?Additional Information: ? ?Your vital signs today were: ?BP (!) 144/92   Pulse 88   Temp 98.7 ?F (37.1 ?C) (Oral)   Resp 17   Ht 5\' 10"  (1.778 m)   Wt 71.7 kg   LMP 05/22/2012   SpO2 98%   BMI 22.67 kg/m?  ?If your blood pressure (BP) was elevated above 135/85 this visit, please have this repeated by your doctor within one month. ?-------------- ? ?

## 2021-07-15 ENCOUNTER — Other Ambulatory Visit: Payer: BC Managed Care – PPO

## 2021-07-28 ENCOUNTER — Other Ambulatory Visit: Payer: Self-pay | Admitting: Pulmonary Disease

## 2021-07-28 DIAGNOSIS — J4550 Severe persistent asthma, uncomplicated: Secondary | ICD-10-CM

## 2021-07-28 MED ORDER — OMALIZUMAB 150 MG/ML ~~LOC~~ SOSY: PREFILLED_SYRINGE | SUBCUTANEOUS | 2 refills | Status: DC

## 2021-07-28 NOTE — Telephone Encounter (Signed)
Refill sent for Megan Reid to [facility]: [phone] for 3 months ? ?Dose: 375 mg SQ every 2 weeks ? ?Last OV: 05/04/21 ?Provider: Dr. Isaiah Serge ? ?Next OV: supposed to be July 2023 (not yet scheduled) ? ?Chesley Mires, PharmD, MPH, BCPS ?Clinical Pharmacist (Rheumatology and Pulmonology) ? ?

## 2021-08-04 ENCOUNTER — Other Ambulatory Visit: Payer: Self-pay

## 2021-08-04 ENCOUNTER — Inpatient Hospital Stay: Payer: BC Managed Care – PPO | Attending: Oncology | Admitting: Oncology

## 2021-08-04 VITALS — BP 121/92 | HR 83 | Temp 97.7°F | Resp 16 | Ht 70.0 in | Wt 160.7 lb

## 2021-08-04 DIAGNOSIS — D709 Neutropenia, unspecified: Secondary | ICD-10-CM | POA: Insufficient documentation

## 2021-08-04 NOTE — Progress Notes (Signed)
Hematology and Oncology Follow Up Visit ? ?Megan Reid ?[id number] ?May 15, 1965 56 y.o. ?08/04/2021 1:18 PM ? ? 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  Her work-up has been unrevealing for a hematological disorder. ? ? ?Prior Therapy: She is status post a bone marrow biopsy which did not show any evidence of dysplasia. PET CT scan in October 2015 did not show any evidence of lymphadenopathy. ? ?Current therapy: Active surveillance. ? ? ?Interim History:  Ms Megan Reid returns today for repeat evaluation.  Since the last visit, she was seen in the emergency department on 2 separate occasions in March 2023.  He had issues with constipation and imaging studies including CT scan chest abdomen pelvis did not show any evidence of malignancy. ? ?Clinically, she feels better at this time with improvement in her constipation and respiratory complaints.  She denies any weight loss or appetite changes her performance status and quality of life remains unchanged. ? ? ? ? ? ?Medications: Updated on review. ? ?Current Outpatient Medications  ?Medication Sig Dispense Refill  ? albuterol (PROVENTIL HFA;VENTOLIN HFA) 108 (90 Base) MCG/ACT inhaler Inhale 2 puffs into the lungs 2 (two) times daily. For shortness of breath 18 g 0  ? cyclobenzaprine (FLEXERIL) 5 MG tablet Take 1 tablet (5 mg total) by mouth at bedtime as needed for muscle spasms. 30 tablet 1  ? docusate sodium (COLACE) 100 MG capsule Take 1 capsule (100 mg total) by mouth every 12 (twelve) hours. 60 capsule 0  ? fluticasone (FLONASE) 50 MCG/ACT nasal spray Place 1 spray into both nostrils 2 (two) times daily as needed (nasal congestion). 16 g 5  ? glycerin adult 2 g suppository Place 1 suppository rectally as needed for constipation. 12 suppository 0  ? hydrochlorothiazide (HYDRODIURIL) 25 MG tablet Take 25 mg by mouth every morning.    ? montelukast (SINGULAIR) 10 MG tablet Take 1  tablet (10 mg total) by mouth at bedtime. 30 tablet 5  ? omalizumab (XOLAIR) 150 MG/ML prefilled syringe INJECT 2 SYRINGES UNDER THE SKIN EVERY 2 WEEKS. TOTAL DOSE =375MG 4 mL 2  ? omalizumab (XOLAIR) 75 MG/0.5ML prefilled syringe INJECT 1 SYRINGE UNDER THE SKIN EVERY 2 WEEKS. TOTAL DOSE = 375MG 2 mL 2  ? ondansetron (ZOFRAN-ODT) 4 MG disintegrating tablet Take 1 tablet (4 mg total) by mouth every 8 (eight) hours as needed for nausea or vomiting. 20 tablet 0  ? pantoprazole (PROTONIX) 40 MG tablet Take 1 tablet (40 mg total) by mouth daily. 30 tablet 5  ? polyethylene glycol powder (GLYCOLAX/MIRALAX) 17 GM/SCOOP powder Take 17 g by mouth 2 (two) times daily. 255 g 1  ? Spacer/Aero-Holding Chambers (AEROCHAMBER PLUS) inhaler Use as instructed 2 each 2  ? SYMBICORT 160-4.5 MCG/ACT inhaler INHALE 2 PUFFS INTO THE LUNGS TWICE DAILY 10.2 g 5  ? Tiotropium Bromide Monohydrate (SPIRIVA RESPIMAT) 2.5 MCG/ACT AERS Inhale 2 puffs into the lungs daily. 4 g 5  ? ?No current facility-administered medications for this visit.  ? ? ?Allergies:  ?Allergies  ?Allergen Reactions  ? Sulfa Antibiotics Anaphylaxis, Hives and Swelling  ?  Swelling of the face and tongue   ? ? ? ? ?Physical Exam: ? ? ?Blood pressure (!) 121/92, pulse 83, temperature 97.7 ?F (36.5 ?C), temperature source Temporal, resp. rate 16, height _0  (1.778 m), weight 160 lb 11.2 oz (72.9 kg), last menstrual period 05/22/2012, SpO2 100 %. ? ? ? ?ECOG: 1 ?  ? ? ?  General appearance: Alert, awake without any distress. ?Head: Atraumatic without abnormalities ?Oropharynx: Without any thrush or ulcers. ?Eyes: No scleral icterus. ?Lymph nodes: No lymphadenopathy noted in the cervical, supraclavicular, or axillary nodes ?Heart:regular rate and rhythm, without any murmurs or gallops.   ?Lung: Clear to auscultation without any rhonchi, wheezes or dullness to percussion. ?Abdomin: Soft, nontender without any shifting dullness or ascites. ?Musculoskeletal: No clubbing or  cyanosis. ?Neurological: No motor or sensory deficits. ?Skin: No rashes or lesions. ? ? ? ? ? ? ?Lab Results: ?Lab Results  ?Component Value Date  ? WBC 4.0 07/05/2021  ? HGB 11.6 (L) 07/05/2021  ? HCT 36.2 07/05/2021  ? MCV 86.2 07/05/2021  ? PLT 121 (L) 07/05/2021  ? ?  Chemistry   ?   ?Component Value Date/Time  ? NA 130 (L) 07/05/2021 1926  ? NA 142 09/29/2015 0000  ? NA 139 04/13/2015 1323  ? K 3.5 07/05/2021 1926  ? K 4.2 04/13/2015 1323  ? CL 96 (L) 07/05/2021 1926  ? CL 104 03/23/2013 0101  ? CL 105 01/11/2012 1349  ? CO2 26 07/05/2021 1926  ? CO2 26 04/13/2015 1323  ? BUN 17 07/05/2021 1926  ? BUN 18 09/29/2015 0000  ? BUN 15.9 04/13/2015 1323  ? CREATININE 1.23 (H) 07/05/2021 1926  ? CREATININE 0.89 10/30/2016 1046  ? CREATININE 0.9 04/13/2015 1323  ? GLU 92 09/29/2015 0000  ?    ?Component Value Date/Time  ? CALCIUM 9.5 07/05/2021 1926  ? CALCIUM 9.7 04/13/2015 1323  ? ALKPHOS 40 07/05/2021 1926  ? ALKPHOS 65 04/13/2015 1323  ? AST 34 07/05/2021 1926  ? AST 26 04/13/2015 1323  ? ALT 18 07/05/2021 1926  ? ALT 13 04/13/2015 1323  ? BILITOT 0.5 07/05/2021 1926  ? BILITOT 0.30 04/13/2015 1323  ?  ? ?  ? ? ?Impression and Plan: ? ?56 year old woman with:  ? ?1.  Fluctuating neutropenia noted in 2013.  Etiology is related to benign fluctuation versus autoimmune etiology. ? ? ?Laboratory data in March 2023 showed normal white cell count and normal absolute neutrophil count.  No suggestions of a hematological disorder at this time.  She does not require any growth factor support or intervention. ? ? ?2.  Lymphadenopathy: CT scan of the chest abdomen and pelvis obtained in March 2023 showed no evidence of any lymphadenopathy. ? ?3.  Pulmonary considerations: She is following up with a allergy specialist regarding that. ? ?4.  Neck masses: Ultrasound of the thyroid obtained in January 2023 showed no concerning masses.  Repeat ultrasound will be done in a year. ? ? ?5.  Follow-up: In 6 months for repeat  follow-up. ? ? ?30 minutes were dedicated to this encounter.  Time was spent on reviewing laboratory data, reviewing imaging studies and future plan of care discussion. ? ? ?Zola Button, MD ?4/20/20231:18 PM  ?

## 2021-09-29 ENCOUNTER — Encounter: Payer: Self-pay | Admitting: Oncology

## 2021-10-14 ENCOUNTER — Telehealth: Payer: Self-pay

## 2021-10-14 NOTE — Telephone Encounter (Signed)
Received faxed PA renewal form from [facility]. Completed and refaxed along with clinical documentation, will await response.  Phone# [phone] Fax# [phone]

## 2021-10-17 NOTE — Telephone Encounter (Signed)
Received notification from [facility] regarding a prior authorization for XOLAIR. Authorization has been APPROVED from 10/15/2021 to 10/16/2022. Approval letter sent to scan center.  Authorization # [id number]

## 2021-10-26 ENCOUNTER — Other Ambulatory Visit: Payer: Self-pay | Admitting: Otolaryngology

## 2021-10-26 DIAGNOSIS — K1120 Sialoadenitis, unspecified: Secondary | ICD-10-CM

## 2021-10-28 ENCOUNTER — Other Ambulatory Visit: Payer: Self-pay | Admitting: Pulmonary Disease

## 2021-10-28 ENCOUNTER — Telehealth: Payer: Self-pay | Admitting: Pharmacist

## 2021-10-28 DIAGNOSIS — J4550 Severe persistent asthma, uncomplicated: Secondary | ICD-10-CM

## 2021-10-28 NOTE — Telephone Encounter (Signed)
Refill sent for [facility] to [facility]: [phone]  Dose: 375 mg every 2 weeks  Last OV: 05/04/21 Provider: Dr. Isaiah Serge  Next OV: not scheduled  Chesley Mires, PharmD, MPH, BCPS Clinical Pharmacist (Rheumatology and Pulmonology)

## 2021-10-28 NOTE — Telephone Encounter (Signed)
I received refill request for pt for Xolair. Per chart notes, pt is still having chronic cough. We had attempted to switch her to Tezspire last year but she did not want to come to the clinic every 2 weeks for injections  Now that Dorothea Ogle is available as autoinjecotr and for self-administration, I offered that we could reattempt authorization and see if she is approved  Of note, she does state that she believes her providers are not addressing the chronic cough and that there may be an underlying issue. She is not sure if the chronic cough is related to her asthma anymore.  Submitted a Prior Authorization request to [facility] for TEZSPIRE via CoverMyMeds. Will update once we receive a response.  Key: [id number]  Chesley Mires, PharmD, MPH, BCPS, CPP Clinical Pharmacist (Rheumatology and Pulmonology)

## 2021-11-08 ENCOUNTER — Other Ambulatory Visit: Payer: BC Managed Care – PPO

## 2021-11-08 ENCOUNTER — Ambulatory Visit
Admission: RE | Admit: 2021-11-08 | Discharge: 2021-11-08 | Disposition: A | Discharge status: Home / Self Care | Payer: BC Managed Care – PPO | Source: Ambulatory Visit | Attending: Otolaryngology | Admitting: Otolaryngology

## 2021-11-08 DIAGNOSIS — K1120 Sialoadenitis, unspecified: Secondary | ICD-10-CM

## 2021-11-08 MED ORDER — IOPAMIDOL (ISOVUE-300) INJECTION 61%
100.0000 mL | Freq: Once | INTRAVENOUS | Status: AC | PRN
  Administered 2021-11-08: 100 mL via INTRAVENOUS

## 2021-11-15 NOTE — Telephone Encounter (Signed)
Tezspire for self-administration is denied (still not on formulary). MyChart message sent to patient to advise and see if she is interested in receiving at infusion center (not for self-admin)  Chesley Mires, PharmD, MPH, BCPS, CPP Clinical Pharmacist (Rheumatology and Pulmonology)

## 2021-11-16 ENCOUNTER — Telehealth: Payer: Self-pay | Admitting: Neurology

## 2021-11-16 DIAGNOSIS — M48061 Spinal stenosis, lumbar region without neurogenic claudication: Secondary | ICD-10-CM

## 2021-11-16 DIAGNOSIS — M5416 Radiculopathy, lumbar region: Secondary | ICD-10-CM

## 2021-11-16 NOTE — Telephone Encounter (Signed)
LMOM for patient to call back to sch appt with Allena Katz

## 2021-11-16 NOTE — Telephone Encounter (Signed)
Called and spoke to patient and informed her that we will resend her physical therapy referral. Patient requested we send it to [facility] at [facility] on [address].   Informed patient that she will need to speak to her PCP in regards to her swollen glands/facial pain. Patient stated that she has already seen her PCP, ENT, and dentist and they all suggested that it may be trigeminal pain and she will need to see Dr. Allena Katz.  I informed patient I will pass  her message to Dr. Allena Katz and give her a call back.

## 2021-11-16 NOTE — Telephone Encounter (Signed)
Please resend referral for PT for low back strengthening/stretching for lumbar radiculopathy.  Patient will need to see her PCP for facial pain/swollen glands.

## 2021-11-16 NOTE — Telephone Encounter (Signed)
Patient left message on the VM stating that she was to have a referral for PT back in Jan or Feb and never heard from anyone  and wanted to check on that. She also states the she has swollen gland in her face that she needs to see Dr Allena Katz for so I will call her to get her sch but she wants to speak with some one about the referral and the problem she is having in her face. In a lot of pain

## 2021-11-16 NOTE — Telephone Encounter (Signed)
I am happy to see her for her facial pain, but since this is a new complaint, it would be helpful to have her PCP and ENT notes faxed to our office.

## 2021-11-17 NOTE — Telephone Encounter (Signed)
Called patient and informed her that Dr. Allena Katz would be happy to see her for her facial pain. Asked patient to please have her PCP and ENT send over office notes for Dr. Eliane Decree review. Patient agreed and was provided with our fax number. Patient is aware I will have the front desk call her to get her scheduled with Dr. Allena Katz.

## 2021-11-17 NOTE — Telephone Encounter (Signed)
Patient is sch for 11-18-21 with patel

## 2021-11-18 ENCOUNTER — Encounter: Payer: Self-pay | Admitting: Neurology

## 2021-11-18 ENCOUNTER — Ambulatory Visit (INDEPENDENT_AMBULATORY_CARE_PROVIDER_SITE_OTHER): Payer: BC Managed Care – PPO | Admitting: Neurology

## 2021-11-18 VITALS — BP 114/78 | HR 96 | Resp 18 | Ht 69.0 in | Wt 164.0 lb

## 2021-11-18 DIAGNOSIS — G509 Disorder of trigeminal nerve, unspecified: Secondary | ICD-10-CM

## 2021-11-18 DIAGNOSIS — M3500 Sicca syndrome, unspecified: Secondary | ICD-10-CM

## 2021-11-18 MED ORDER — CARBAMAZEPINE 200 MG PO TABS: ORAL_TABLET | ORAL | 3 refills | Status: DC

## 2021-11-18 NOTE — Patient Instructions (Addendum)
Start carbamazepine 200mg  daily for one week, then increase to 1 tablet twice daily  Stop medication if you develop any rash.  Call with update in 1-2 months

## 2021-11-18 NOTE — Progress Notes (Signed)
Follow-up Visit   Date: 11/18/21   Megan Reid MRN: [id number] DOB: 23-Mar-1966   Interim History: Megan Reid is a 56 y.o. right-handed female with asthma, Sjogren's disease, cytopenia, and lumbar canal stenosis  returning to the clinic with complaints of right facial pain.  The patient was accompanied to the clinic by self.  In June 2023, she began having right side ear pain and diagnosed with swimmer's ear. She has numbness over the right side over the cheek, jaw, upper and lower lips. No pain involving the nose or forehead.  She complains of skin hypersensitivity on the right cheek.  She denies shooting pain over the right side of the face. She was treated with antibiotics which would temporarily relieve her pain and discomfort.  She saw ENT who ordered CT which showed heterogeneity of the parotid and submandibular glands, consistent with Sjogren's syndrome.  She has been treated with several cycles of antibiotics and steroids, which resolves symptoms when she is taking it, but once the course has been completed, her right parotid fullness and facial pain returned.  She will be seeing rheumatology at [facility] in a few weeks.   Medications:  Current Outpatient Medications on File Prior to Visit  Medication Sig Dispense Refill   albuterol (PROVENTIL HFA;VENTOLIN HFA) 108 (90 Base) MCG/ACT inhaler Inhale 2 puffs into the lungs 2 (two) times daily. For shortness of breath 18 g 0   cyclobenzaprine (FLEXERIL) 5 MG tablet Take 1 tablet (5 mg total) by mouth at bedtime as needed for muscle spasms. 30 tablet 1   docusate sodium (COLACE) 100 MG capsule Take 1 capsule (100 mg total) by mouth every 12 (twelve) hours. 60 capsule 0   fluticasone (FLONASE) 50 MCG/ACT nasal spray Place 1 spray into both nostrils 2 (two) times daily as needed (nasal congestion). 16 g 5   glycerin adult 2 g suppository Place 1 suppository rectally as needed for constipation. 12 suppository 0   hydrochlorothiazide  (HYDRODIURIL) 25 MG tablet Take 25 mg by mouth every morning.     montelukast (SINGULAIR) 10 MG tablet Take 1 tablet (10 mg total) by mouth at bedtime. 30 tablet 5   omalizumab (XOLAIR) 150 MG/ML prefilled syringe INJECT 2 SYRINGES UNDER THE SKIN EVERY 2 WEEKS. TOTAL DOSE =375MG  4 mL 2   omalizumab (XOLAIR) 75 MG/0.5ML prefilled syringe INJECT 1 SYRINGE UNDER THE SKIN EVERY 2 WEEKS. TOTAL DOSE = 375MG  2 mL 2   ondansetron (ZOFRAN-ODT) 4 MG disintegrating tablet Take 1 tablet (4 mg total) by mouth every 8 (eight) hours as needed for nausea or vomiting. 20 tablet 0   pantoprazole (PROTONIX) 40 MG tablet Take 1 tablet (40 mg total) by mouth daily. 30 tablet 5   polyethylene glycol powder (GLYCOLAX/MIRALAX) 17 GM/SCOOP powder Take 17 g by mouth 2 (two) times daily. 255 g 1   Spacer/Aero-Holding Chambers (AEROCHAMBER PLUS) inhaler Use as instructed 2 each 2   SYMBICORT 160-4.5 MCG/ACT inhaler INHALE 2 PUFFS INTO THE LUNGS TWICE DAILY 10.2 g 5   Tiotropium Bromide Monohydrate (SPIRIVA RESPIMAT) 2.5 MCG/ACT AERS Inhale 2 puffs into the lungs daily. 4 g 5   No current facility-administered medications on file prior to visit.    Allergies:  Allergies  Allergen Reactions   Sulfa Antibiotics Anaphylaxis, Hives and Swelling    Swelling of the face and tongue     Vital Signs:  BP 114/78   Pulse 96   Resp 18   Ht 5\' 9"  (1.753 m)  Wt 164 lb (74.4 kg)   LMP 05/22/2012   SpO2 100%   BMI 24.22 kg/m   Neurological Exam: MENTAL STATUS including orientation to time, place, person, recent and remote memory, attention span and concentration, language, and fund of knowledge is normal.  Speech is not dysarthric.  CRANIAL NERVES:  Pupils equal round and reactive to light.  Normal conjugate, extra-ocular eye movements in all directions of gaze.  No ptosis.  Hyperesthesia to light touch, temperature, and pin prick over the right V3 distribution.  There is asymmetric fullness of the right parotid gland as  compared to the left.  Palate elevates symmetrically.  Tongue is midline.  MOTOR:  Motor strength is 5/5 in all extremities.  No atrophy, fasciculations or abnormal movements.  No pronator drift.  Tone is normal.    MSRs:  Reflexes are 2+/4 throughout.  SENSORY:  Intact to vibration throughout.  COORDINATION/GAIT:  Normal finger-to- nose-finger. Gait narrow based and stable.   Data: CT soft tissue neck w contrast 11/08/2021: 1. Heterogeneity of the parotid and submandibular glands, consistent with the patient's diagnosis of Sjogren syndrome, without sialolithiasis or evidence of inflammatory changes or ductal dilatation to suggest sialadenitis. 2. 1.5 cm hypoenhancing nodule in the left thyroid lobe, previously evaluated with ultrasound on 05/12/2021.  IMPRESSION/PLAN: Right trigeminal nerve pain secondary to localized nerve irritation due to proximity of the parotid gland.  She has Sjogren's disease causing changes to the parotid gland and will be seeing rheumatology in the upcoming weeks.  Given that her symptoms are steroid-responsive, her facial pain should improve once underlying autoimmune disease is better controlled.   This is not classic trigeminal neuralgia as we see with central nervous system pathology.  For pain relief, I will sart carbamazepine 200mg  daily x 1 week, then titrate to 200mg  BID.  Side effects, including rash, discussed.    Patient to call with update in 1-2 months.  OK to titrate carbamazepine further, if tolerating.   Total time spent reviewing records, interview, history/exam, documentation, and coordination of care on day of encounter:  30 min    Thank you for allowing me to participate in patient's care.  If I can answer any additional questions, I would be pleased to do so.    Sincerely,    Edson Deridder K. , DO

## 2021-11-28 ENCOUNTER — Telehealth: Payer: Self-pay | Admitting: Oncology

## 2021-11-28 NOTE — Telephone Encounter (Signed)
Called patient regarding upcoming October appointment, patient is notified.   

## 2021-11-29 ENCOUNTER — Telehealth: Payer: Self-pay | Admitting: *Deleted

## 2021-11-29 NOTE — Telephone Encounter (Signed)
Returned PC to patient, informed her [facility] faxed her lab results from 11/25/21 to [place] & this was reviewed by Dr. Clelia Croft, he states her labwork is stable & he does not need to see her before her appointment in October.  Patient verbalizes understanding.

## 2021-12-02 ENCOUNTER — Encounter: Payer: Self-pay | Admitting: Physical Therapy

## 2021-12-02 ENCOUNTER — Other Ambulatory Visit: Payer: Self-pay

## 2021-12-02 ENCOUNTER — Ambulatory Visit: Payer: BC Managed Care – PPO | Attending: Neurology | Admitting: Physical Therapy

## 2021-12-02 DIAGNOSIS — K7200 Acute and subacute hepatic failure without coma: Secondary | ICD-10-CM | POA: Diagnosis not present

## 2021-12-02 DIAGNOSIS — M48061 Spinal stenosis, lumbar region without neurogenic claudication: Secondary | ICD-10-CM | POA: Insufficient documentation

## 2021-12-02 DIAGNOSIS — B179 Acute viral hepatitis, unspecified: Secondary | ICD-10-CM | POA: Diagnosis not present

## 2021-12-02 DIAGNOSIS — M6281 Muscle weakness (generalized): Secondary | ICD-10-CM | POA: Insufficient documentation

## 2021-12-02 DIAGNOSIS — M545 Low back pain, unspecified: Secondary | ICD-10-CM | POA: Insufficient documentation

## 2021-12-02 DIAGNOSIS — R2681 Unsteadiness on feet: Secondary | ICD-10-CM | POA: Insufficient documentation

## 2021-12-02 DIAGNOSIS — M5416 Radiculopathy, lumbar region: Secondary | ICD-10-CM | POA: Insufficient documentation

## 2021-12-02 DIAGNOSIS — G8929 Other chronic pain: Secondary | ICD-10-CM | POA: Insufficient documentation

## 2021-12-02 NOTE — Therapy (Signed)
OUTPATIENT PHYSICAL THERAPY THORACOLUMBAR EVALUATION   Patient Name: Megan Reid MRN: [id number] DOB:1966-03-23, 56 y.o., female Today's Date: 12/02/2021   PT End of Session - 12/02/21 0958     Visit Number 1    Number of Visits --   1-2x/week   Date for PT Re-Evaluation 01/27/22    Authorization Type [employer] - FOTO    PT Start Time 0930    PT Stop Time 1005    PT Time Calculation (min) 35 min             Past Medical History:  Diagnosis Date   Arthralgia 02/14/2016   Asthma    Autoimmune disease ([place]) 02/14/2016   Positive RF 139 CCP Negative  Positive ANA 1:640 Positive Ro Positive [place] Elevated ESR 105 - Sjorgen's Disease   DDD (degenerative disc disease), lumbar 02/14/2016   Fatigue 02/14/2016   Gastritis 02/14/2016   Hot flashes    Migraines 02/14/2016   Osteoarthritis of both hands 02/14/2016   Osteoarthritis of both knees 02/14/2016   Vitamin D deficiency 02/14/2016   Past Surgical History:  Procedure Laterality Date   BREAST BIOPSY Left    ROTATOR CUFF REPAIR Right 06/16/2019   TUBAL LIGATION     Patient Active Problem List   Diagnosis Date Noted   Seasonal and perennial allergic rhinitis 12/02/2020   Chronic coughing 12/02/2020   Sjogren's syndrome ([place]) 12/02/2020   Leukopenia 12/02/2020   Adverse reaction to food, subsequent encounter 06/23/2020   Heartburn 06/23/2020   Bruising 06/23/2020   Abrasion of right ankle 02/13/2017   Rheumatoid factor positive 09/01/2016   Leucopenia 09/01/2016   Autoimmune disease ([place]) 02/14/2016   Fatigue 02/14/2016   DDD (degenerative disc disease), lumbar 02/14/2016   Osteoarthritis of both knees 02/14/2016   Osteoarthritis of both hands 02/14/2016   Asthma 02/14/2016   Migraines 02/14/2016   Gastritis 02/14/2016   Vitamin D deficiency 02/14/2016   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[place]) 02/14/2016   High risk medication use 02/14/2016    PCP: Leeroy Cha, MD  REFERRING  PROVIDER: Alda Berthold, DO  THERAPY DIAG:  Chronic bilateral low back pain, unspecified whether sciatica present  Muscle weakness  Unsteadiness on feet  REFERRING DIAG: Lumbar radiculopathy [M54.16], Spinal stenosis of lumbar region without neurogenic claudication [M48.061]  Rationale for Evaluation and Treatment Rehabilitation  SUBJECTIVE:  PERTINENT PAST HISTORY:  Spinal stenosis, autoimmune condition, anterolisthesis, anemia      PRECAUTIONS: spondylolisthesis   WEIGHT BEARING RESTRICTIONS No  FALLS:  Has patient fallen in last 6 months? No, Number of falls: 0  MOI/History of condition:  Onset date: >10 years  SMANTHA Reid is a 56 y.o. female who presents to clinic with chief complaint of low back pain with bil leg pain L>R.  She was attending PT with good results several years ago, but stopped during the pandemic.  She reports she has a leg length discrepancy and the therapist "pulled on my leg".  Her pain has slowly increased since this time.  She is able to maintain work, but this is challenging   Red flags:  denies BB changes and saddle anesthesia  Pain:  Are you having pain? Yes Pain location: low back and bil knee pain L>R NPRS scale:  current 8/10  average 8/10  Aggravating factors: cold, standing, walking  NPRS, highest: 9/10 Relieving factors: rest, sitting  NPRS: best: 8/10 Pain description: intermittent, constant, burning, and aching Stage: Chronic Stability: staying the same 24 hour pattern: no  clear pattern   Occupation: Pension scheme manager: none  Hand Dominance: NA  Patient Goals/Specific Activities: improve pain and ability to complete ADL   OBJECTIVE:   DIAGNOSTIC FINDINGS:  CT  Musculoskeletal: Advanced degenerative changes at L5-S1 with 13 mm anterolisthesis L5 on S1. No pars defect. Facet degenerative changes at L5-S1   IMPRESSION: 1. Focal wall thickening with mucosal enhancement and inflammation extending from  distal transverse colon to the proximal sigmoid colon consistent with colitis of infectious, inflammatory, or ischemic etiology. Negative for intramural air or free air.  GENERAL OBSERVATION/GAIT:  Antalgic gait with reduced time in stance on L, wide BOS  SENSATION:  Light touch: Deficits L L4-S1, R L5  MUSCLE LENGTH: Hamstrings: Right no restriction; Left significant restriction ASLR: Right ASLR = PSLR; Left ASLR significantly reduced compared to PSLR  Thomas test: Right significant restriction; Left subtle restriction  LUMBAR AROM  AROM AROM  12/02/2021  Flexion limited by 25%, w/ concordant pain  Extension   Right lateral flexion limited by 50%, w/ concordant pain  Left lateral flexion limited by 50%, w/ concordant pain  Right rotation limited by 25%  Left rotation limited by 75%, w/ concordant pain    (Blank rows = not tested)  LE MMT:  MMT Right 12/02/2021 Left 12/02/2021  Hip flexion (L2, L3) 3+ 3  Knee extension (L3) 3+ 3  Knee flexion    Hip abduction 2+ 2+  Hip extension Functionally weak with bridge Functionally weak with bridge  Hip external rotation    Hip internal rotation    Hip adduction    Ankle dorsiflexion (L4) N N  Ankle plantarflexion (S1)    Ankle inversion    Ankle eversion    Great Toe ext (L5) N D  Grossly     (Blank rows = not tested, score listed is out of 5 possible points.  N = WNL, D = diminished, C = clear for gross weakness with myotome testing, * = concordant pain with testing)  Functional Tests  Eval (12/02/2021)    30'' STS: 5x  UE used? Y    Sustained supine bridge (dominant leg extended at 120'', if reached): 6'' partial ROM (norm 170'')    Progressive balance screen (highest level completed for >/= 10''):  Feet together: unable                                                    LUMBAR SPECIAL TESTS:  Straight leg raise: L (+), R (-) Slump: L (-), R (-)  PATIENT SURVEYS:  FOTO 42 -> 56   TODAY'S  TREATMENT  Creating, reviewing, and completing below HEP  PATIENT EDUCATION:  POC, diagnosis, prognosis, HEP, and outcome measures.  Pt educated via explanation, demonstration, and handout (HEP).  Pt confirms understanding verbally.   HOME EXERCISE PROGRAM: Access Code: [email or web address] URL: [email or web address] Date: 12/02/2021 Prepared by: Shearon Balo  Exercises - Hip Flexor Stretch at [facility] of Bed  - 2 x daily - 7 x weekly - 2 sets - 45 second hold - Hooklying Isometric Clamshell  - 1 x daily - 7 x weekly - 3 sets - 10 reps - Posterior Pelvic Tilt with Adduction Using Pillow in Hooklying  - 2 x daily - 7 x weekly - 10 reps - 10 second hold  ASTERISK SIGNS   Asterisk Signs Eval (  12/02/2021)       Supine bridge 6'' partial ROM       Pain  8/10 average                                 ASSESSMENT:  CLINICAL IMPRESSION: Ruhee is a 56 y.o. female who presents to clinic with signs and sxs consistent with low back and leg pain L>R secondary to stenotic changes.  Clear signs of neural impingement.  Gross deconditioning and severe.  Pt has concurrent anemia which limits her ability to complete exercise.  OBJECTIVE IMPAIRMENTS: Pain, lumbar ROM, core/hip/LE strength, gait, balance  ACTIVITY LIMITATIONS: lifting, walking, standing, housework  PERSONAL FACTORS: See medical history and pertinent history   REHAB POTENTIAL: Fair significant spinal stenosis, chornic  CLINICAL DECISION MAKING: Stable/uncomplicated  EVALUATION COMPLEXITY: Low   GOALS:   SHORT TERM GOALS: Target date: 12/23/2021  Sylvester will be >75% HEP compliant to improve carryover between sessions and facilitate independent management of condition  Evaluation (12/02/2021): ongoing Goal status: INITIAL   LONG TERM GOALS: Target date: 01/27/2022  Zanyla will improve FOTO score to 56 as a proxy for functional improvement  Evaluation/Baseline (12/02/2021): 42 Goal status: INITIAL   2.  Torry  will self report >/= 50% decrease in pain from evaluation   Evaluation/Baseline (12/02/2021): 9/10 max pain, 8/10 average Goal status: INITIAL   3.  Charika will be able to maintain supine bridge for 30'' (dominant leg extended if 120'' reached) as evidence of improved hip extension and core strength (norm for healthy adult ~170'')   Evaluation/Baseline (12/02/2021): 6'' partial ROM Goal status: INITIAL   4.  Rhianna will improve 30'' STS (MCID 2) to >/= 7x (w/ UE?: y) to show improved LE strength and improved transfers   Evaluation/Baseline (12/02/2021): 5x  w/ UE? y Goal status: INITIAL   5.  Detrice will be able to stand for >30'' with feet together, to show a significant improvement in balance in order to reduce fall risk   Evaluation/Baseline (12/02/2021): unable Goal status: INITIAL   PLAN: PT FREQUENCY: 1-2x/week  PT DURATION: 8 weeks (Ending 01/27/2022)  PLANNED INTERVENTIONS: Therapeutic exercises, Aquatic therapy, Therapeutic activity, Neuro Muscular re-education, Gait training, Patient/Family education, Joint mobilization, Dry Needling, Electrical stimulation, Spinal mobilization and/or manipulation, Moist heat, Taping, Vasopneumatic device, Ionotophoresis 75m/ml Dexamethasone, and Manual therapy  PLAN FOR NEXT SESSION: progressive hip/core strengthening, balance, hip flexor stretching   KShearon BaloPT, DPT 12/02/2021, 10:13 AM

## 2021-12-04 ENCOUNTER — Other Ambulatory Visit: Payer: Self-pay

## 2021-12-04 ENCOUNTER — Emergency Department (HOSPITAL_COMMUNITY): Payer: BC Managed Care – PPO

## 2021-12-04 ENCOUNTER — Encounter (HOSPITAL_COMMUNITY): Payer: Self-pay

## 2021-12-04 ENCOUNTER — Inpatient Hospital Stay (HOSPITAL_COMMUNITY)
Admission: EM | Admit: 2021-12-04 | Discharge: 2021-12-12 | DRG: 441 | Disposition: A | Discharge status: Rehabilitation Facility | Payer: BC Managed Care – PPO | Attending: Internal Medicine | Admitting: Internal Medicine

## 2021-12-04 DIAGNOSIS — K7200 Acute and subacute hepatic failure without coma: Secondary | ICD-10-CM | POA: Diagnosis present

## 2021-12-04 DIAGNOSIS — Z79899 Other long term (current) drug therapy: Secondary | ICD-10-CM | POA: Diagnosis not present

## 2021-12-04 DIAGNOSIS — M48061 Spinal stenosis, lumbar region without neurogenic claudication: Secondary | ICD-10-CM | POA: Diagnosis present

## 2021-12-04 DIAGNOSIS — D61818 Other pancytopenia: Secondary | ICD-10-CM | POA: Diagnosis present

## 2021-12-04 DIAGNOSIS — D72819 Decreased white blood cell count, unspecified: Secondary | ICD-10-CM | POA: Diagnosis present

## 2021-12-04 DIAGNOSIS — Z87891 Personal history of nicotine dependence: Secondary | ICD-10-CM | POA: Diagnosis not present

## 2021-12-04 DIAGNOSIS — N170 Acute kidney failure with tubular necrosis: Secondary | ICD-10-CM | POA: Diagnosis present

## 2021-12-04 DIAGNOSIS — Z8249 Family history of ischemic heart disease and other diseases of the circulatory system: Secondary | ICD-10-CM

## 2021-12-04 DIAGNOSIS — R339 Retention of urine, unspecified: Secondary | ICD-10-CM | POA: Diagnosis present

## 2021-12-04 DIAGNOSIS — R6510 Systemic inflammatory response syndrome (SIRS) of non-infectious origin without acute organ dysfunction: Secondary | ICD-10-CM | POA: Diagnosis present

## 2021-12-04 DIAGNOSIS — Z7951 Long term (current) use of inhaled steroids: Secondary | ICD-10-CM | POA: Diagnosis not present

## 2021-12-04 DIAGNOSIS — N179 Acute kidney failure, unspecified: Secondary | ICD-10-CM | POA: Diagnosis not present

## 2021-12-04 DIAGNOSIS — M3500 Sicca syndrome, unspecified: Secondary | ICD-10-CM | POA: Diagnosis present

## 2021-12-04 DIAGNOSIS — E559 Vitamin D deficiency, unspecified: Secondary | ICD-10-CM | POA: Diagnosis present

## 2021-12-04 DIAGNOSIS — J45909 Unspecified asthma, uncomplicated: Secondary | ICD-10-CM | POA: Diagnosis present

## 2021-12-04 DIAGNOSIS — Z882 Allergy status to sulfonamides status: Secondary | ICD-10-CM | POA: Diagnosis not present

## 2021-12-04 DIAGNOSIS — G4700 Insomnia, unspecified: Secondary | ICD-10-CM | POA: Diagnosis present

## 2021-12-04 DIAGNOSIS — Z20822 Contact with and (suspected) exposure to covid-19: Secondary | ICD-10-CM | POA: Diagnosis present

## 2021-12-04 DIAGNOSIS — X58XXXD Exposure to other specified factors, subsequent encounter: Secondary | ICD-10-CM | POA: Diagnosis present

## 2021-12-04 DIAGNOSIS — Z832 Family history of diseases of the blood and blood-forming organs and certain disorders involving the immune mechanism: Secondary | ICD-10-CM | POA: Diagnosis not present

## 2021-12-04 DIAGNOSIS — G9341 Metabolic encephalopathy: Secondary | ICD-10-CM | POA: Diagnosis present

## 2021-12-04 DIAGNOSIS — D696 Thrombocytopenia, unspecified: Secondary | ICD-10-CM | POA: Diagnosis present

## 2021-12-04 DIAGNOSIS — G500 Trigeminal neuralgia: Secondary | ICD-10-CM | POA: Diagnosis present

## 2021-12-04 DIAGNOSIS — B179 Acute viral hepatitis, unspecified: Secondary | ICD-10-CM | POA: Diagnosis present

## 2021-12-04 DIAGNOSIS — E872 Acidosis, unspecified: Secondary | ICD-10-CM | POA: Diagnosis present

## 2021-12-04 DIAGNOSIS — E876 Hypokalemia: Secondary | ICD-10-CM | POA: Diagnosis present

## 2021-12-04 DIAGNOSIS — F419 Anxiety disorder, unspecified: Secondary | ICD-10-CM | POA: Diagnosis present

## 2021-12-04 DIAGNOSIS — K5900 Constipation, unspecified: Secondary | ICD-10-CM | POA: Diagnosis present

## 2021-12-04 DIAGNOSIS — R000 Tachycardia, unspecified: Secondary | ICD-10-CM | POA: Diagnosis present

## 2021-12-04 DIAGNOSIS — R0981 Nasal congestion: Secondary | ICD-10-CM | POA: Diagnosis present

## 2021-12-04 DIAGNOSIS — Y929 Unspecified place or not applicable: Secondary | ICD-10-CM | POA: Diagnosis not present

## 2021-12-04 DIAGNOSIS — R791 Abnormal coagulation profile: Secondary | ICD-10-CM | POA: Diagnosis present

## 2021-12-04 DIAGNOSIS — Z825 Family history of asthma and other chronic lower respiratory diseases: Secondary | ICD-10-CM | POA: Diagnosis not present

## 2021-12-04 DIAGNOSIS — M3501 Sicca syndrome with keratoconjunctivitis: Secondary | ICD-10-CM | POA: Diagnosis present

## 2021-12-04 DIAGNOSIS — Z888 Allergy status to other drugs, medicaments and biological substances status: Secondary | ICD-10-CM | POA: Diagnosis not present

## 2021-12-04 DIAGNOSIS — I10 Essential (primary) hypertension: Secondary | ICD-10-CM | POA: Diagnosis present

## 2021-12-04 DIAGNOSIS — R7989 Other specified abnormal findings of blood chemistry: Secondary | ICD-10-CM | POA: Diagnosis present

## 2021-12-04 DIAGNOSIS — R5381 Other malaise: Secondary | ICD-10-CM | POA: Diagnosis present

## 2021-12-04 DIAGNOSIS — I422 Other hypertrophic cardiomyopathy: Secondary | ICD-10-CM | POA: Diagnosis not present

## 2021-12-04 DIAGNOSIS — Z841 Family history of disorders of kidney and ureter: Secondary | ICD-10-CM | POA: Diagnosis not present

## 2021-12-04 DIAGNOSIS — G928 Other toxic encephalopathy: Secondary | ICD-10-CM | POA: Diagnosis present

## 2021-12-04 DIAGNOSIS — K759 Inflammatory liver disease, unspecified: Secondary | ICD-10-CM | POA: Diagnosis present

## 2021-12-04 DIAGNOSIS — R4189 Other symptoms and signs involving cognitive functions and awareness: Secondary | ICD-10-CM | POA: Diagnosis present

## 2021-12-04 LAB — COMPREHENSIVE METABOLIC PANEL
ALT: 2902 U/L — ABNORMAL HIGH (ref 0–44)
AST: 9740 U/L — ABNORMAL HIGH (ref 15–41)
Albumin: 2.9 g/dL — ABNORMAL LOW (ref 3.5–5.0)
Alkaline Phosphatase: 84 U/L (ref 38–126)
Anion gap: 13 (ref 5–15)
BUN: 69 mg/dL — ABNORMAL HIGH (ref 6–20)
CO2: 19 mmol/L — ABNORMAL LOW (ref 22–32)
Calcium: 8.4 mg/dL — ABNORMAL LOW (ref 8.9–10.3)
Chloride: 101 mmol/L (ref 98–111)
Creatinine, Ser: 3.41 mg/dL — ABNORMAL HIGH (ref 0.44–1.00)
GFR, Estimated: 15 mL/min — ABNORMAL LOW (ref >60)
Glucose, Bld: 142 mg/dL — ABNORMAL HIGH (ref 70–99)
Potassium: 4 mmol/L (ref 3.5–5.1)
Sodium: 133 mmol/L — ABNORMAL LOW (ref 135–145)
Total Bilirubin: 0.9 mg/dL (ref 0.3–1.2)
Total Protein: 9.1 g/dL — ABNORMAL HIGH (ref 6.5–8.1)

## 2021-12-04 LAB — CBC WITH DIFFERENTIAL/PLATELET
Abs Immature Granulocytes: 0.01 10*3/uL (ref 0.00–0.07)
Basophils Absolute: 0 10*3/uL (ref 0.0–0.1)
Basophils Relative: 1 %
Eosinophils Absolute: 0 10*3/uL (ref 0.0–0.5)
Eosinophils Relative: 0 %
HCT: 40.5 % (ref 36.0–46.0)
Hemoglobin: 13.6 g/dL (ref 12.0–15.0)
Immature Granulocytes: 1 %
Lymphocytes Relative: 23 %
Lymphs Abs: 0.4 10*3/uL — ABNORMAL LOW (ref 0.7–4.0)
MCH: 28.6 pg (ref 26.0–34.0)
MCHC: 33.6 g/dL (ref 30.0–36.0)
MCV: 85.1 fL (ref 80.0–100.0)
Monocytes Absolute: 0.1 10*3/uL (ref 0.1–1.0)
Monocytes Relative: 3 %
Neutro Abs: 1.3 10*3/uL — ABNORMAL LOW (ref 1.7–7.7)
Neutrophils Relative %: 72 %
Platelets: 41 10*3/uL — ABNORMAL LOW (ref 150–400)
RBC: 4.76 MIL/uL (ref 3.87–5.11)
RDW: 14.5 % (ref 11.5–15.5)
WBC: 1.8 10*3/uL — ABNORMAL LOW (ref 4.0–10.5)
nRBC: 0 % (ref 0.0–0.2)

## 2021-12-04 LAB — ACETAMINOPHEN LEVEL: Acetaminophen (Tylenol), Serum: <10 ug/mL — ABNORMAL LOW (ref 10–30)

## 2021-12-04 LAB — TYPE AND SCREEN
ABO/RH(D): A POS
Antibody Screen: NEGATIVE

## 2021-12-04 LAB — AMMONIA: Ammonia: 36 umol/L — ABNORMAL HIGH (ref 9–35)

## 2021-12-04 LAB — ETHANOL: Alcohol, Ethyl (B): <10 mg/dL (ref <10)

## 2021-12-04 LAB — HIV ANTIBODY (ROUTINE TESTING W REFLEX): HIV Screen 4th Generation wRfx: NONREACTIVE

## 2021-12-04 LAB — HEPATITIS PANEL, ACUTE
HCV Ab: NONREACTIVE
Hep A IgM: NONREACTIVE
Hep B C IgM: NONREACTIVE
Hepatitis B Surface Ag: NONREACTIVE

## 2021-12-04 LAB — CK: Total CK: 1110 U/L — ABNORMAL HIGH (ref 38–234)

## 2021-12-04 LAB — LIPASE, BLOOD: Lipase: 209 U/L — ABNORMAL HIGH (ref 11–51)

## 2021-12-04 LAB — APTT: aPTT: 48 seconds — ABNORMAL HIGH (ref 24–36)

## 2021-12-04 LAB — CARBAMAZEPINE LEVEL, TOTAL: Carbamazepine Lvl: 4.3 ug/mL (ref 4.0–12.0)

## 2021-12-04 LAB — PROTIME-INR
INR: 1.4 — ABNORMAL HIGH (ref 0.8–1.2)
Prothrombin Time: 17 seconds — ABNORMAL HIGH (ref 11.4–15.2)

## 2021-12-04 MED ORDER — SODIUM CHLORIDE 0.9 % IV SOLN: 1000.0000 mL | INTRAVENOUS | Status: DC

## 2021-12-04 MED ORDER — ONDANSETRON HCL 4 MG/2ML IJ SOLN
4.0000 mg | Freq: Once | INTRAMUSCULAR | Status: DC
  Filled 2021-12-04: qty 2

## 2021-12-04 MED ORDER — ACETYLCYSTEINE LOAD VIA INFUSION
150.0000 mg/kg | Freq: Once | INTRAVENOUS | Status: AC
Start: 2021-12-04 — End: 2021-12-04
  Administered 2021-12-04: 11295 mg via INTRAVENOUS
  Filled 2021-12-04: qty 371

## 2021-12-04 MED ORDER — LACTATED RINGERS IV SOLN: INTRAVENOUS | Status: AC

## 2021-12-04 MED ORDER — SODIUM CHLORIDE 0.9 % IV BOLUS (SEPSIS)
1000.0000 mL | Freq: Once | INTRAVENOUS | Status: AC
  Administered 2021-12-04: 1000 mL via INTRAVENOUS

## 2021-12-04 MED ORDER — DEXTROSE 5 % IV SOLN
15.0000 mg/kg/h | INTRAVENOUS | Status: DC
  Administered 2021-12-04 – 2021-12-06 (×4): 15 mg/kg/h via INTRAVENOUS
  Filled 2021-12-04 (×5): qty 90

## 2021-12-04 MED ORDER — MORPHINE SULFATE (PF) 4 MG/ML IV SOLN
4.0000 mg | Freq: Once | INTRAVENOUS | Status: DC
  Filled 2021-12-04: qty 1

## 2021-12-04 NOTE — ED Notes (Signed)
Pt. States unable to stAND AND URINATE AT

## 2021-12-04 NOTE — ED Provider Triage Note (Signed)
Emergency Medicine Provider Triage Evaluation Note  Megan Reid , a 56 y.o. female  was evaluated in triage.  Pt complains of not feeling well for the past 3 days.  Lying in bed complaining of abdominal pain.  Decreased p.o. intake.  Denies shortness of breath, chest pain, cough.  Review of Systems  Positive: As above Negative: As above  Physical Exam  BP 119/82 (BP Location: Right Arm)   Pulse (!) 118   Temp 99.9 F (37.7 C) (Oral)   Resp 18   LMP 05/22/2012   SpO2 94%  Gen:   Awake, no distress   Resp:  Normal effort MSK:   Moves extremities without difficulty  Other:  Flank tenderness, abdominal pain  Medical Decision Making  Medically screening exam initiated at 1:19 PM.  Appropriate orders placed.  TUESDAY TERLECKI was informed that the remainder of the evaluation will be completed by another provider, this initial triage assessment does not replace that evaluation, and the importance of remaining in the ED until their evaluation is complete.     Marita [place], PA-C 12/04/21 1320

## 2021-12-04 NOTE — ED Provider Notes (Addendum)
[facility] EMERGENCY DEPARTMENT Provider Note   CSN: [id number] Arrival date & time: 12/04/21  1234     History  Chief Complaint  Patient presents with   Abdominal Pain    Megan Reid is a 56 y.o. female.   Abdominal Pain  Patient has a history of asthma, pancytopenia, Sjogren's disease  Pt was feeling sick the beginning of the weak.  She initally tried to work.  She was able to drink some water but not eat much beginning.  Pt was able to drink some fluids today but still has not been able to eat.  Pt is having abd pain around her entire abdomen.  Pt has had constant diarrhea the last couple of days.  6 times per day.  Today her symptoms got even worse and she felt more weak.  Patient denies any new medications but she did recently receive some vaccinations in preparation of work.  She denies any alcohol use but she does take a lot of Tylenol.  Family is not certain but is possible she could be taking it more than she is supposed to  Home Medications Prior to Admission medications   Medication Sig Start Date End Date Taking? Authorizing Provider  albuterol (PROVENTIL HFA;VENTOLIN HFA) 108 (90 Base) MCG/ACT inhaler Inhale 2 puffs into the lungs 2 (two) times daily. For shortness of breath 04/15/18   Reubin Milan, MD  carbamazepine (TEGRETOL) 200 MG tablet Take 1 tablet daily x 1 week, then increase to 1 tablet twice daily 11/18/21   Nita Sickle K, DO  cyclobenzaprine (FLEXERIL) 5 MG tablet Take 1 tablet (5 mg total) by mouth at bedtime as needed for muscle spasms. 04/04/21   Nita Sickle K, DO  docusate sodium (COLACE) 100 MG capsule Take 1 capsule (100 mg total) by mouth every 12 (twelve) hours. 07/06/21   Renne Crigler, PA-C  fluticasone (FLONASE) 50 MCG/ACT nasal spray Place 1 spray into both nostrils 2 (two) times daily as needed (nasal congestion). 06/27/21   Ellamae Sia, DO  glycerin adult 2 g suppository Place 1 suppository rectally as needed for  constipation. 07/06/21   Renne Crigler, PA-C  hydrochlorothiazide (HYDRODIURIL) 25 MG tablet Take 25 mg by mouth every morning. 04/26/21   [provider]  montelukast (SINGULAIR) 10 MG tablet Take 1 tablet (10 mg total) by mouth at bedtime. 06/27/21   Ellamae Sia, DO  omalizumab Geoffry Paradise) 150 MG/ML prefilled syringe INJECT 2 SYRINGES UNDER THE SKIN EVERY 2 WEEKS. TOTAL DOSE =375MG  10/28/21   Mannam, Colbert Coyer, MD  omalizumab Geoffry Paradise) 75 MG/0.5ML prefilled syringe INJECT 1 SYRINGE UNDER THE SKIN EVERY 2 WEEKS. TOTAL DOSE = 375MG  10/28/21   Mannam, 10/30/21, MD  ondansetron (ZOFRAN-ODT) 4 MG disintegrating tablet Take 1 tablet (4 mg total) by mouth every 8 (eight) hours as needed for nausea or vomiting. 06/22/21   08/22/21, MD  pantoprazole (PROTONIX) 40 MG tablet Take 1 tablet (40 mg total) by mouth daily. 06/27/21   06/29/21, DO  polyethylene glycol powder (GLYCOLAX/MIRALAX) 17 GM/SCOOP powder Take 17 g by mouth 2 (two) times daily. 07/06/21   07/08/21, PA-C  Spacer/Aero-Holding Chambers (AEROCHAMBER PLUS) inhaler Use as instructed 06/27/21   06/29/21, DO  SYMBICORT 160-4.5 MCG/ACT inhaler INHALE 2 PUFFS INTO THE LUNGS TWICE DAILY 04/16/19   04/18/19, MD  Tiotropium Bromide Monohydrate (SPIRIVA RESPIMAT) 2.5 MCG/ACT AERS Inhale 2 puffs into the lungs daily. 08/06/20   08/08/20, MD  Allergies    Sulfa antibiotics    Review of Systems   Review of Systems  Gastrointestinal:  Positive for abdominal pain.    Physical Exam Updated Vital Signs BP 117/82   Pulse (!) 101   Temp 100 F (37.8 C) (Oral)   Resp 16   Wt 75.3 kg   LMP 05/22/2012   SpO2 95%   BMI 24.51 kg/m  Physical Exam Vitals and nursing note reviewed.  Constitutional:      Appearance: She is well-developed. She is ill-appearing.  HENT:     Head: Normocephalic and atraumatic.     Right Ear: External ear normal.     Left Ear: External ear normal.  Eyes:     General: No scleral  icterus.       Right eye: No discharge.        Left eye: No discharge.     Conjunctiva/sclera: Conjunctivae normal.  Neck:     Trachea: No tracheal deviation.  Cardiovascular:     Rate and Rhythm: Normal rate and regular rhythm.  Pulmonary:     Effort: Pulmonary effort is normal. No respiratory distress.     Breath sounds: Normal breath sounds. No stridor. No wheezing or rales.  Abdominal:     General: Bowel sounds are normal. There is no distension.     Palpations: Abdomen is soft.     Tenderness: There is generalized abdominal tenderness. There is no guarding or rebound.  Musculoskeletal:        General: No tenderness or deformity.     Cervical back: Neck supple.  Skin:    General: Skin is warm and dry.     Findings: No rash.  Neurological:     General: No focal deficit present.     Mental Status: She is alert.     Cranial Nerves: No cranial nerve deficit (no facial droop, extraocular movements intact, no slurred speech).     Sensory: No sensory deficit.     Motor: No abnormal muscle tone or seizure activity.     Coordination: Coordination normal.  Psychiatric:        Mood and Affect: Mood normal.     ED Results / Procedures / Treatments   Labs (all labs ordered are listed, but only abnormal results are displayed) Labs Reviewed  CBC WITH DIFFERENTIAL/PLATELET - Abnormal; Notable for the following components:      Result Value   WBC 1.8 (*)    Platelets 41 (*)    Neutro Abs 1.3 (*)    Lymphs Abs 0.4 (*)    All other components within normal limits  COMPREHENSIVE METABOLIC PANEL - Abnormal; Notable for the following components:   Sodium 133 (*)    CO2 19 (*)    Glucose, Bld 142 (*)    BUN 69 (*)    Creatinine, Ser 3.41 (*)    Calcium 8.4 (*)    Total Protein 9.1 (*)    Albumin 2.9 (*)    AST 9,740 (*)    ALT 2,902 (*)    GFR, Estimated 15 (*)    All other components within normal limits  LIPASE, BLOOD - Abnormal; Notable for the following components:   Lipase  209 (*)    All other components within normal limits  URINALYSIS, ROUTINE W REFLEX MICROSCOPIC  HIV ANTIBODY (ROUTINE TESTING W REFLEX)  HEPATITIS PANEL, ACUTE  ACETAMINOPHEN LEVEL  CARBAMAZEPINE LEVEL, TOTAL  ETHANOL  PROTIME-INR  APTT  AMMONIA  CK    EKG  EKG Interpretation  Date/Time:  Sunday December 04 2021 13:12:52 EDT Ventricular Rate:  117 PR Interval:  142 QRS Duration: 72 QT Interval:  284 QTC Calculation: 396 R Axis:   59 Text Interpretation: Sinus tachycardia Biatrial enlargement Septal infarct , age undetermined Abnormal ECG When compared with ECG of 05-Dec-2019 02:30, Since last tracing rate faster Confirmed by Linwood Dibbles [phone]) on 12/04/2021 8:45:39 PM  Radiology US Abdomen Complete  Result Date: 12/04/2021 CLINICAL DATA:  Abnormal liver function tests, renal dysfunction EXAM: ABDOMEN ULTRASOUND COMPLETE COMPARISON:  CT done on 07/05/2021 FINDINGS: Gallbladder: No gallstones or wall thickening visualized. No sonographic Murphy sign noted by sonographer. Common bile duct: Diameter: 2.5 mm Liver: There is slightly increased echogenicity in the liver. No focal abnormalities are seen. Portal vein is patent on color Doppler imaging with normal direction of blood flow towards the liver. IVC: No abnormality visualized. Pancreas: Visualized portion unremarkable. Spleen: Size and appearance within normal limits. Right Kidney: Length: 9.5 cm. There is increased cortical echogenicity. There is no hydronephrosis. Left Kidney: Length: 11 cm. There is increased echogenicity. There is no hydronephrosis. Small left renal calculus seen in the previous CT is not distinctly visualized. Abdominal aorta: No aneurysm visualized. Other findings: None. IMPRESSION: Fatty liver. Increased cortical echogenicity in both kidneys suggests medical renal disease. There is no hydronephrosis. Abdomen sonogram is otherwise unremarkable. Electronically Signed   By: Ernie Avena M.D.   On: 12/04/2021  20:23   DG Chest 2 View  Result Date: 12/04/2021 CLINICAL DATA:  Not feeling well for 3 days. EXAM: CHEST - 2 VIEW COMPARISON:  Chest radiograph 07/05/2021 FINDINGS: The cardiomediastinal silhouette is normal. There is no focal consolidation or pulmonary edema. There is no pleural effusion or pneumothorax There is no acute osseous abnormality. IMPRESSION: No radiographic evidence of acute cardiopulmonary process. Electronically Signed   By: Lesia Hausen M.D.   On: 12/04/2021 16:57    Procedures .Critical Care  Performed by: Linwood Dibbles, MD Authorized by: Linwood Dibbles, MD   Critical care provider statement:    Critical care time (minutes):  30   Critical care was time spent personally by me on the following activities:  Development of treatment plan with patient or surrogate, discussions with consultants, evaluation of patient's response to treatment, examination of patient, ordering and review of laboratory studies, ordering and review of radiographic studies, ordering and performing treatments and interventions, pulse oximetry, re-evaluation of patient's condition and review of old charts     Medications Ordered in ED Medications  sodium chloride 0.9 % bolus 1,000 mL (has no administration in time range)    Followed by  sodium chloride 0.9 % bolus 1,000 mL (has no administration in time range)    Followed by  0.9 %  sodium chloride infusion (has no administration in time range)  ondansetron (ZOFRAN) injection 4 mg (has no administration in time range)  morphine (PF) 4 MG/ML injection 4 mg (has no administration in time range)  acetylcysteine (ACETADOTE) 30.5 mg/mL load via infusion 11,295 mg (has no administration in time range)    Followed by  acetylcysteine (ACETADOTE) 18,000 mg in dextrose 5 % 590 mL (30.5085 mg/mL) infusion (has no administration in time range)    ED Course/ Medical Decision Making/ A&P Clinical Course as of 12/04/21 2046  Sun Dec 04, 2021  1955 Discussed with  poison control.  Not clear that she has a chronic tylenol ingestion but it is possible.  Would treat for 24 hours [JK]  2012 Comprehensive  metabolic panel(!) Severe elevation lfts, acute renal failure [JK]  2017 Lipase elevated [JK]  2017 CBC with Differential(!) Leukopenia recurrent [JK]  2027 Case discussed with Dr Bosie Clos.  Agrees with evaluation at this point.  Will see patient in the AM.  Feels pt can be medically managed here for now, no emergent need to transfer to another center.   Case discussed with Dr Toniann Fail regarding admission [JK]    Clinical Course User Index [JK] Linwood Dibbles, MD                           Medical Decision Making Problems Addressed: Acute hepatitis: acute illness or injury that poses a threat to life or bodily functions Acute renal failure, unspecified acute renal failure type [facility]): acute illness or injury that poses a threat to life or bodily functions Leukopenia, unspecified type: chronic illness or injury with exacerbation, progression, or side effects of treatment Thrombocytopenia (HCC): chronic illness or injury with exacerbation, progression, or side effects of treatment  Amount and/or Complexity of Data Reviewed Labs: ordered. Decision-making details documented in ED Course. Radiology: ordered.  Risk Prescription drug management. Decision regarding hospitalization.   Patient presented with acute abdominal pain vomiting diarrhea.  Patient's laboratory tests are notable for acute severe hepatitis concerning for acute liver failure.  Patient is awake and answering questions but I am concerned about the possibility of evolving encephalopathy.  We will add on an ammonia level.  We will also add on coags.  Patient denies any alcohol use.  She has not started any new medications.  I did ask her about Tylenol she cannot quantify for me how much she has been taking.  She definitely has been taking Tylenol and her husband states she takes a lot of pain  medications.  It is possible that a chronic Tylenol ingestion could be causing her acute liver disease.  I have added on carbamazepine levels as well as acetaminophen levels.  I discussed the case with the [facility] and we will initiate Acetadote treatment.  I discussed the case with Dr. Bosie Clos and agrees with the evaluation so far.  Patient can continue to be managed at this facility for now.  He will consult on patient.  I discussed the case with Dr. Toniann Fail regarding admission.        Final Clinical Impression(s) / ED Diagnoses Final diagnoses:  Acute renal failure, unspecified acute renal failure type (HCC)  Thrombocytopenia (HCC)  Leukopenia, unspecified type  Acute hepatitis    Rx / DC Orders ED Discharge Orders     None         Linwood Dibbles, MD 12/04/21 2032    Linwood Dibbles, MD 12/04/21 2046

## 2021-12-04 NOTE — H&P (Signed)
History and Physical    Megan Reid:[id number] DOB: Mar 15, 1966 DOA: 12/04/2021  PCP: Leeroy Cha, MD  Patient coming from: Home.  Chief Complaint: Lethargy, diarrhea and weakness.  History obtained from patient's husband.  HPI: Megan Reid is a 56 y.o. female with history of Sjogren syndrome, right facial pain recently started on carbamazepine was brought to the ER patient was found to be increasingly lethargic having increasing bowel movements and abdominal discomfort.  Symptoms are worsening last 2 to 3 days.  Denies any chest pain shortness of breath fever chills.  Over the last few weeks patient has been increasing pain all over and has been taking ibuprofen and Tylenol.  The exact amounts and not clear.  Denies drinking any alcohol.  ED Course: In the ER patient is found to be mildly confused.  Labs show AST of 9700 ALT of 2900 creatinine has worsened from 1.2 in March 2023 and it is around 3.4 total bilirubin is normal.  Alkaline phosphatase is normal.  CBC shows leukopenia and thrombocytopenia.  Patient had acute hepatitis panel done which was negative Tylenol levels were negative.  INR 1.4.  ER physician discussed with on-call gastroenterologist Dr. Michail Sermon who will be seeing patient in consult.  Since patient had taken Tylenol recently more than usual discussed with poison control started IV acetylcysteine.  Review of Systems: As per HPI, rest all negative.   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 Her smoking use included cigarettes. She has a 1.00 pack-year smoking history. She has never used smokeless tobacco. She reports that she does not drink alcohol and does not use drugs.  Allergies  Allergen Reactions   Sulfa Antibiotics Anaphylaxis, Hives and Swelling    Swelling of the face and tongue     Family History  Problem Relation Age of Onset   Kidney failure Mother    Heart failure Mother    Lupus Mother    COPD Father    Hypertension Sister    Acromegaly Sister    Breast cancer Paternal Aunt 59    Prior to Admission medications   Medication Sig Start Date End Date Taking? Authorizing Provider  albuterol (PROVENTIL HFA;VENTOLIN HFA) 108 (90 Base) MCG/ACT inhaler Inhale 2 puffs into the lungs 2 (two) times daily. For shortness of breath 04/15/18   Glean Hess, MD  carbamazepine (TEGRETOL) 200 MG tablet Take 1 tablet daily x 1 week, then increase to 1 tablet twice daily 11/18/21   Narda Amber K, DO  cyclobenzaprine (FLEXERIL) 5 MG tablet Take 1 tablet (5 mg total) by mouth at bedtime as needed for muscle spasms. 04/04/21   Narda Amber K, DO  docusate sodium (COLACE) 100 MG capsule Take 1 capsule (100 mg total) by mouth every 12 (twelve) hours. 07/06/21  Carlisle Cater, PA-C  fluticasone (FLONASE) 50 MCG/ACT nasal spray Place 1 spray into both nostrils 2 (two) times daily as needed (nasal congestion). 06/27/21   Garnet Sierras, DO  glycerin adult 2 g suppository Place 1 suppository rectally as needed for constipation. 07/06/21   Carlisle Cater, PA-C  hydrochlorothiazide (HYDRODIURIL) 25 MG tablet Take 25 mg by mouth every morning. 04/26/21   [provider]  montelukast (SINGULAIR) 10 MG tablet Take 1 tablet (10 mg total) by mouth at bedtime. 06/27/21   Garnet Sierras, DO  omalizumab Arvid Right) 150 MG/ML  prefilled syringe INJECT 2 SYRINGES UNDER THE SKIN EVERY 2 WEEKS. TOTAL DOSE =375MG 10/28/21   Mannam, Hart Robinsons, MD  omalizumab Arvid Right) 75 MG/0.5ML prefilled syringe INJECT 1 SYRINGE UNDER THE SKIN EVERY 2 WEEKS. TOTAL DOSE = 375MG 10/28/21   Mannam, Praveen, MD  ondansetron (ZOFRAN-ODT) 4 MG disintegrating tablet Take 1 tablet (4 mg total) by mouth every 8 (eight) hours as needed for nausea or vomiting. 06/22/21   Truddie Hidden, MD  pantoprazole (PROTONIX) 40 MG tablet Take 1 tablet (40 mg total) by mouth daily. 06/27/21   Garnet Sierras, DO  polyethylene glycol powder (GLYCOLAX/MIRALAX) 17 GM/SCOOP powder Take 17 g by mouth 2 (two) times daily. 07/06/21   Carlisle Cater, PA-C  Spacer/Aero-Holding Chambers (AEROCHAMBER PLUS) inhaler Use as instructed 06/27/21   Garnet Sierras, DO  SYMBICORT 160-4.5 MCG/ACT inhaler INHALE 2 PUFFS INTO THE LUNGS TWICE DAILY 04/16/19   Glean Hess, MD  Tiotropium Bromide Monohydrate (SPIRIVA RESPIMAT) 2.5 MCG/ACT AERS Inhale 2 puffs into the lungs daily. 08/06/20   Marshell Garfinkel, MD    Physical Exam: Constitutional: Moderately built and nourished. Vitals:   12/04/21 1314 12/04/21 1613 12/04/21 1945 12/04/21 2000  BP:  132/73 117/82   Pulse:  (!) 110 (!) 101   Resp:  15 16   Temp:  100 F (37.8 C)    TempSrc:  Oral    SpO2: 94% 95% 95%   Weight:    75.3 kg   Eyes: Anicteric no pallor. ENMT: No discharge from the ears eyes nose or mouth. Neck: No mass felt.  No neck rigidity. Respiratory: No rhonchi or crepitations. Cardiovascular: S1-S2 heard. Abdomen: Soft nontender bowel sound present. Musculoskeletal: No edema. Skin: No rash. Neurologic: Alert awake appears mildly confused oriented to name and place moving all extremities. Psychiatric: Oriented to name and place.   Labs on Admission: I have personally reviewed following labs and imaging studies  CBC: Recent Labs  Lab 12/04/21 1339  WBC 1.8*  NEUTROABS 1.3*  HGB 13.6  HCT 40.5  MCV 85.1   PLT 41*   Basic Metabolic Panel: Recent Labs  Lab 12/04/21 1339  NA 133*  K 4.0  CL 101  CO2 19*  GLUCOSE 142*  BUN 69*  CREATININE 3.41*  CALCIUM 8.4*   GFR: Estimated Creatinine Clearance: 19 mL/min (A) (by C-G formula based on SCr of 3.41 mg/dL (H)). Liver Function Tests: Recent Labs  Lab 12/04/21 1339  AST 9,740*  ALT 2,902*  ALKPHOS 84  BILITOT 0.9  PROT 9.1*  ALBUMIN 2.9*   Recent Labs  Lab 12/04/21 1339  LIPASE 209*   Recent Labs  Lab 12/04/21 1950  AMMONIA 36*   Coagulation Profile: Recent Labs  Lab 12/04/21 1950  INR 1.4*   Cardiac Enzymes: Recent Labs  Lab 12/04/21 1931  CKTOTAL 1,110*   BNP (last 3 results) No results for input(s): "PROBNP" in the last 8760 hours. HbA1C:  No results for input(s): "HGBA1C" in the last 72 hours. CBG: No results for input(s): "GLUCAP" in the last 168 hours. Lipid Profile: No results for input(s): "CHOL", "HDL", "LDLCALC", "TRIG", "CHOLHDL", "LDLDIRECT" in the last 72 hours. Thyroid Function Tests: No results for input(s): "TSH", "T4TOTAL", "FREET4", "T3FREE", "THYROIDAB" in the last 72 hours. Anemia Panel: No results for input(s): "VITAMINB12", "FOLATE", "FERRITIN", "TIBC", "IRON", "RETICCTPCT" in the last 72 hours. Urine analysis:    Component Value Date/Time   COLORURINE YELLOW 07/05/2021 1926   APPEARANCEUR CLEAR 07/05/2021 1926   LABSPEC 1.017 07/05/2021 1926   PHURINE 7.0 07/05/2021 1926   GLUCOSEU NEGATIVE 07/05/2021 1926   HGBUR NEGATIVE 07/05/2021 1926   BILIRUBINUR NEGATIVE 07/05/2021 [place] NEGATIVE 07/05/2021 1926   PROTEINUR NEGATIVE 07/05/2021 1926   NITRITE NEGATIVE 07/05/2021 1926   LEUKOCYTESUR NEGATIVE 07/05/2021 1926   Sepsis Labs: @LABRCNTIP (procalcitonin:4,lacticidven:4) )No results found for this or any previous visit (from the past 240 hour(s)).   Radiological Exams on Admission: US Abdomen Complete  Result Date: 12/04/2021 CLINICAL DATA:  Abnormal liver  function tests, renal dysfunction EXAM: ABDOMEN ULTRASOUND COMPLETE COMPARISON:  CT done on 07/05/2021 FINDINGS: Gallbladder: No gallstones or wall thickening visualized. No sonographic Murphy sign noted by sonographer. Common bile duct: Diameter: 2.5 mm Liver: There is slightly increased echogenicity in the liver. No focal abnormalities are seen. Portal vein is patent on color Doppler imaging with normal direction of blood flow towards the liver. IVC: No abnormality visualized. Pancreas: Visualized portion unremarkable. Spleen: Size and appearance within normal limits. Right Kidney: Length: 9.5 cm. There is increased cortical echogenicity. There is no hydronephrosis. Left Kidney: Length: 11 cm. There is increased echogenicity. There is no hydronephrosis. Small left renal calculus seen in the previous CT is not distinctly visualized. Abdominal aorta: No aneurysm visualized. Other findings: None. IMPRESSION: Fatty liver. Increased cortical echogenicity in both kidneys suggests medical renal disease. There is no hydronephrosis. Abdomen sonogram is otherwise unremarkable. Electronically Signed   By: Elmer Picker M.D.   On: 12/04/2021 20:23   DG Chest 2 View  Result Date: 12/04/2021 CLINICAL DATA:  Not feeling well for 3 days. EXAM: CHEST - 2 VIEW COMPARISON:  Chest radiograph 07/05/2021 FINDINGS: The cardiomediastinal silhouette is normal. There is no focal consolidation or pulmonary edema. There is no pleural effusion or pneumothorax There is no acute osseous abnormality. IMPRESSION: No radiographic evidence of acute cardiopulmonary process. Electronically Signed   By: Valetta Mole M.D.   On: 12/04/2021 16:57    EKG: Independently reviewed.  Sinus tachycardia.  Assessment/Plan Principal Problem:   Acute hepatitis Active Problems:   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HCC)   Leucopenia   Sjogren's syndrome (HCC)   Thrombocytopenia (HCC)    Acute hepatitis -cause not clear.   Among the differentials shock liver.  1 dose of steroids Solu-Medrol 125 mg IV was given for possible autoimmune hepatitis.  We will await further recommendation gastroenterologist.  Follow LFTs INR.  Continue IV acetylcysteine.  Patient's acute hepatitis panel was negative. Fever -patient started having fever after admission.  COVID test was negative.  Blood cultures have been obtained.  Chest x-ray unremarkable.  UA does not show any signs for infection.  Started on empiric antibiotics. Acute renal failure -patient states she was taking ibuprofen recently.  Also has been having some diarrhea.  UA does show some protein.  Presently receiving fluids.  Closely follow intake output metabolic panel.  CT abdomen pelvis pending. Thrombocytopenia -we will check smear review and  LDH levels to rule out any hemolytic process.  Follow CBC closely. Leukopenia appears to be chronic.  Follow CBC. Right facial pain recently started on carbamazepine.  Carbamazepine levels are pending.  Continue once patient is alert awake. History of Sjogren syndrome. History of asthma.  Since patient has significantly elevated LFTs with renal failure fever will need close monitoring inpatient status.   DVT prophylaxis: SCDs.  Avoiding anticoagulation in the setting of thrombocytopenia. Code Status: Full code. Family Communication: Discussed with patient. Disposition Plan: Home. Consults called: Pulmonary critical care. Admission status: Inpatient.   Rise Patience MD [facility] Pager [phone].  If 7PM-7AM, please contact night-coverage [email or web address] Password [facility]  12/04/2021, 9:28 PM

## 2021-12-04 NOTE — ED Notes (Signed)
Ice packs placed under patient's arms for fever reduction per admitting provider.

## 2021-12-04 NOTE — ED Notes (Signed)
Tacey Ruiz, RN from poison control called for update. Poison control agreeable to plan of care.

## 2021-12-04 NOTE — ED Triage Notes (Addendum)
Pt arrived POV from home per family member pt has been down and out for 3-4 days. Pt just lays in the bed, she wont eat or drink just had her first sip of water today. Pt also states she keeps having to use the bathroom but it is solid.Pt is c/o generalized stomach pains that started 2 days ago.

## 2021-12-05 ENCOUNTER — Inpatient Hospital Stay (HOSPITAL_COMMUNITY): Payer: BC Managed Care – PPO

## 2021-12-05 DIAGNOSIS — I422 Other hypertrophic cardiomyopathy: Secondary | ICD-10-CM | POA: Diagnosis not present

## 2021-12-05 DIAGNOSIS — K759 Inflammatory liver disease, unspecified: Secondary | ICD-10-CM | POA: Diagnosis present

## 2021-12-05 DIAGNOSIS — B179 Acute viral hepatitis, unspecified: Secondary | ICD-10-CM | POA: Diagnosis not present

## 2021-12-05 LAB — CBC WITH DIFFERENTIAL/PLATELET
Abs Immature Granulocytes: 0.02 10*3/uL (ref 0.00–0.07)
Basophils Absolute: 0 10*3/uL (ref 0.0–0.1)
Basophils Relative: 0 %
Eosinophils Absolute: 0 10*3/uL (ref 0.0–0.5)
Eosinophils Relative: 0 %
HCT: 32.2 % — ABNORMAL LOW (ref 36.0–46.0)
Hemoglobin: 11 g/dL — ABNORMAL LOW (ref 12.0–15.0)
Immature Granulocytes: 1 %
Lymphocytes Relative: 24 %
Lymphs Abs: 0.4 10*3/uL — ABNORMAL LOW (ref 0.7–4.0)
MCH: 28.6 pg (ref 26.0–34.0)
MCHC: 34.2 g/dL (ref 30.0–36.0)
MCV: 83.6 fL (ref 80.0–100.0)
Monocytes Absolute: 0.1 10*3/uL (ref 0.1–1.0)
Monocytes Relative: 3 %
Neutro Abs: 1.3 10*3/uL — ABNORMAL LOW (ref 1.7–7.7)
Neutrophils Relative %: 72 %
Platelets: 24 10*3/uL — CL (ref 150–400)
RBC: 3.85 MIL/uL — ABNORMAL LOW (ref 3.87–5.11)
RDW: 14.5 % (ref 11.5–15.5)
WBC: 1.8 10*3/uL — ABNORMAL LOW (ref 4.0–10.5)
nRBC: 0 % (ref 0.0–0.2)

## 2021-12-05 LAB — URINALYSIS, ROUTINE W REFLEX MICROSCOPIC
Glucose, UA: NEGATIVE mg/dL
Ketones, ur: NEGATIVE mg/dL
Leukocytes,Ua: NEGATIVE
Nitrite: NEGATIVE
Protein, ur: >300 mg/dL — AB
Specific Gravity, Urine: 1.02 (ref 1.005–1.030)
pH: 6 (ref 5.0–8.0)

## 2021-12-05 LAB — BASIC METABOLIC PANEL
Anion gap: 17 — ABNORMAL HIGH (ref 5–15)
BUN: 82 mg/dL — ABNORMAL HIGH (ref 6–20)
CO2: 14 mmol/L — ABNORMAL LOW (ref 22–32)
Calcium: 7.2 mg/dL — ABNORMAL LOW (ref 8.9–10.3)
Chloride: 105 mmol/L (ref 98–111)
Creatinine, Ser: 4.28 mg/dL — ABNORMAL HIGH (ref 0.44–1.00)
GFR, Estimated: 11 mL/min — ABNORMAL LOW (ref >60)
Glucose, Bld: 123 mg/dL — ABNORMAL HIGH (ref 70–99)
Potassium: 3.9 mmol/L (ref 3.5–5.1)
Sodium: 136 mmol/L (ref 135–145)

## 2021-12-05 LAB — URINALYSIS, MICROSCOPIC (REFLEX)

## 2021-12-05 LAB — HEPATIC FUNCTION PANEL
ALT: 2127 U/L — ABNORMAL HIGH (ref 0–44)
AST: 7050 U/L — ABNORMAL HIGH (ref 15–41)
Albumin: 2 g/dL — ABNORMAL LOW (ref 3.5–5.0)
Alkaline Phosphatase: 65 U/L (ref 38–126)
Bilirubin, Direct: 1 mg/dL — ABNORMAL HIGH (ref 0.0–0.2)
Indirect Bilirubin: 0.5 mg/dL (ref 0.3–0.9)
Total Bilirubin: 1.5 mg/dL — ABNORMAL HIGH (ref 0.3–1.2)
Total Protein: 6.7 g/dL (ref 6.5–8.1)

## 2021-12-05 LAB — ECHOCARDIOGRAM COMPLETE
AR max vel: 3.64 cm2
AV Peak grad: 5.6 mmHg
Ao pk vel: 1.18 m/s
Area-P 1/2: 3.65 cm2
S' Lateral: 3.03 cm
Weight: 2656 oz

## 2021-12-05 LAB — PROTIME-INR
INR: 2.7 — ABNORMAL HIGH (ref 0.8–1.2)
Prothrombin Time: 28.1 seconds — ABNORMAL HIGH (ref 11.4–15.2)

## 2021-12-05 LAB — FERRITIN: Ferritin: 722 ng/mL — ABNORMAL HIGH (ref 11–307)

## 2021-12-05 LAB — TECHNOLOGIST SMEAR REVIEW: Plt Morphology: DECREASED

## 2021-12-05 LAB — ACETAMINOPHEN LEVEL
Acetaminophen (Tylenol), Serum: <10 ug/mL — ABNORMAL LOW (ref 10–30)
Acetaminophen (Tylenol), Serum: <10 ug/mL — ABNORMAL LOW (ref 10–30)

## 2021-12-05 LAB — GLUCOSE, CAPILLARY: Glucose-Capillary: 100 mg/dL — ABNORMAL HIGH (ref 70–99)

## 2021-12-05 LAB — BLOOD GAS, VENOUS
Acid-base deficit: 5.2 mmol/L — ABNORMAL HIGH (ref 0.0–2.0)
Bicarbonate: 17.7 mmol/L — ABNORMAL LOW (ref 20.0–28.0)
Drawn by: 31609
O2 Saturation: 80.3 %
Patient temperature: 37
pCO2, Ven: 26 mmHg — ABNORMAL LOW (ref 44–60)
pH, Ven: 7.44 — ABNORMAL HIGH (ref 7.25–7.43)
pO2, Ven: 48 mmHg — ABNORMAL HIGH (ref 32–45)

## 2021-12-05 LAB — CK: Total CK: 926 U/L — ABNORMAL HIGH (ref 38–234)

## 2021-12-05 LAB — MRSA NEXT GEN BY PCR, NASAL: MRSA by PCR Next Gen: NOT DETECTED

## 2021-12-05 LAB — LACTIC ACID, PLASMA
Lactic Acid, Venous: 2.6 mmol/L (ref 0.5–1.9)
Lactic Acid, Venous: 2.2 mmol/L (ref 0.5–1.9)

## 2021-12-05 LAB — SARS CORONAVIRUS 2 BY RT PCR: SARS Coronavirus 2 by RT PCR: NEGATIVE

## 2021-12-05 LAB — CBG MONITORING, ED: Glucose-Capillary: 123 mg/dL — ABNORMAL HIGH (ref 70–99)

## 2021-12-05 LAB — LACTATE DEHYDROGENASE: LDH: >10000 U/L — ABNORMAL HIGH (ref 98–192)

## 2021-12-05 LAB — PROCALCITONIN: Procalcitonin: 18.09 ng/mL

## 2021-12-05 LAB — SALICYLATE LEVEL: Salicylate Lvl: <7 mg/dL — ABNORMAL LOW (ref 7.0–30.0)

## 2021-12-05 MED ORDER — MENTHOL 3 MG MT LOZG
1.0000 | LOZENGE | OROMUCOSAL | Status: DC | PRN
  Administered 2021-12-05: 3 mg via ORAL
  Filled 2021-12-05: qty 9

## 2021-12-05 MED ORDER — DOCUSATE SODIUM 100 MG PO CAPS
100.0000 mg | ORAL_CAPSULE | Freq: Two times a day (BID) | ORAL | Status: DC | PRN
  Administered 2021-12-10 – 2021-12-11 (×2): 100 mg via ORAL
  Filled 2021-12-05 (×2): qty 1

## 2021-12-05 MED ORDER — SODIUM CHLORIDE 0.9 % IV SOLN
2.0000 g | Freq: Every day | INTRAVENOUS | Status: DC
  Administered 2021-12-05 – 2021-12-08 (×5): 2 g via INTRAVENOUS
  Filled 2021-12-05 (×5): qty 12.5

## 2021-12-05 MED ORDER — VANCOMYCIN HCL 1500 MG/300ML IV SOLN
1500.0000 mg | Freq: Once | INTRAVENOUS | Status: AC
  Administered 2021-12-05: 1500 mg via INTRAVENOUS
  Filled 2021-12-05: qty 300

## 2021-12-05 MED ORDER — POLYETHYLENE GLYCOL 3350 17 G PO PACK
17.0000 g | PACK | Freq: Every day | ORAL | Status: DC | PRN
  Administered 2021-12-11: 17 g via ORAL
  Filled 2021-12-05: qty 1

## 2021-12-05 MED ORDER — ONDANSETRON HCL 4 MG/2ML IJ SOLN
4.0000 mg | Freq: Three times a day (TID) | INTRAMUSCULAR | Status: DC | PRN
  Administered 2021-12-05 – 2021-12-06 (×3): 4 mg via INTRAVENOUS
  Filled 2021-12-05 (×3): qty 2

## 2021-12-05 MED ORDER — SODIUM CHLORIDE 0.9 % IV SOLN: INTRAVENOUS | Status: DC

## 2021-12-05 MED ORDER — ORAL CARE MOUTH RINSE
15.0000 mL | OROMUCOSAL | Status: DC | PRN
Start: 2021-12-05 — End: 2021-12-12

## 2021-12-05 MED ORDER — METHYLPREDNISOLONE SODIUM SUCC 125 MG IJ SOLR
125.0000 mg | Freq: Once | INTRAMUSCULAR | Status: AC
  Administered 2021-12-05: 125 mg via INTRAVENOUS
  Filled 2021-12-05: qty 2

## 2021-12-05 MED ORDER — VANCOMYCIN VARIABLE DOSE PER UNSTABLE RENAL FUNCTION (PHARMACIST DOSING)
Status: DC
Start: 2021-12-05 — End: 2021-12-07

## 2021-12-05 MED ORDER — CHLORHEXIDINE GLUCONATE CLOTH 2 % EX PADS
6.0000 | MEDICATED_PAD | Freq: Every day | CUTANEOUS | Status: DC
  Administered 2021-12-05 – 2021-12-08 (×3): 6 via TOPICAL

## 2021-12-05 NOTE — ED Notes (Signed)
Spoke with [place] NP per provider remove cooling blanket at this time and continue to monitor.

## 2021-12-05 NOTE — ED Notes (Signed)
Cooling blanket removed. Temp is 99. Patient is complaining of sore throat. Will notify MD. VSS otherwise

## 2021-12-05 NOTE — Progress Notes (Signed)
Patient with brown old blood colored emesis . Zofran given and Dr Katrinka Blazing informed. Will continue to monitor

## 2021-12-05 NOTE — Progress Notes (Addendum)
Attending addendum Rounded on this evening. Diffuse abd tenderness on exam. Echo okay Agree with plan below ? Portal vein clot, will check [place] Continue NAC, abx F/u rheum panel, ID panel  My CC time: 35 mins Megan Halsted MD PCCM  Brief Progress Note  Pt. Remains hemodynamically stable . Sats are 100% on RA. RR is 29.  Temp of 100.5 from 104.2 on cooling blanket.  Pt is asleep but arouses easily to call of name.  She is appropriate, answers questions and follow commands .   She states she is starting to feel a bit better.  She is nauseated. Mucus membranes are dry, tongue is dry.  She does endorse trigeminal nerve pain.  Lactate from 2.6 to 2.2 with fluid resuscitation AST and ALT remain elevated , but are down trending  Total Bili 1.5 from 0.9  PCT baseline 18.09 Pt receiving  Maxipime and vanc.  HGB 11/ WBC 1.8/ platelets are 24,000.  No signs of bleeding at present  She is 1.5 L positive Current IVF are Acetadote , and NS at 125 cc per hour.  She has had 500 cc's of Urine output since admission  Remains in ED, Awaiting ICU bed. Consideration of LP on hold until platelets improve Will add CK now and in am to trend Will add Zofran for nausea.>>  EKG with QT/QTcB 284/396 ms>> ok 'd with GI Conflicting statements about starting her carbamazepine. She initially states she never picked it up or started it. Pharmacy called [facility] and confirmed she did pick it up, but unsure if she started it.  Her  Carbamazepine level initially 4.3 (not elevated, within therapeutic window).     Bevelyn Ngo, MSN, AGACNP-BC [facility] See Amion for personal pager PCCM on call pager [phone]  12/05/2021 10:01 AM

## 2021-12-05 NOTE — ED Notes (Signed)
Cooling blanket placed.

## 2021-12-05 NOTE — ED Notes (Signed)
Family currently at bedside. Notified MD of patient being nauseated and requesting medication.

## 2021-12-05 NOTE — Consult Note (Signed)
Megan Reid Admit Date: 12/04/2021 12/05/2021 Rexene Agent Requesting Physician:  Duwayne Heck MD  Reason for Consult:  AKI HPI:  59F admitted after presenting overnight with lethargy/weakness, fevers, anorexia, diarrhea, abdominal pain.  Work-up with severe transaminitis, AST more than ALT, very elevated LDH, new renal failure.  Patient with a history of Sjogren's syndrome, recent trigeminal neuralgia, asthma, GERD.  Recent initiation of carbamazepine and also has been using Tylenol and ibuprofen.     Trend in serum creatinine below.  Today creatinine 4.28 from a baseline creatinine around 1.2.  Renal ultrasound without obstruction or hydronephrosis.  Urine analysis without hematuria or pyuria.  CK is less than 1000.  WBC 1.8, platelets 24, hemoglobin 11.0.  GI consulted.  Patient placed on acetylcysteine.  Testing for hepatitis A, B, C negative.  Other viral studies, tickborne illnesses, ANA, autoimmune hepatitis studies pending.   Creatinine (mg/dL)  Date Value  09/29/2015 0.8  04/13/2015 0.9  08/19/2014 0.8  02/03/2014 0.9  03/23/2013 0.91  01/11/2012 1.1   Creat (mg/dL)  Date Value  10/30/2016 0.89  09/14/2016 0.97  05/16/2016 0.78  02/15/2016 0.83   Creatinine, Ser (mg/dL)  Date Value  12/05/2021 4.28 (H)  12/04/2021 3.41 (H)  07/05/2021 1.23 (H)  06/22/2021 1.15 (H)  12/05/2019 0.88  04/14/2018 0.82  04/19/2012 0.64  04/16/2012 0.79  04/16/2012 0.81  04/12/2012 0.66  ] I/Os: I/O last 3 completed shifts: In: 2000 [I.V.:2000] Out: 500 [Urine:500]   ROS NSAIDS: As above IV Contrast no exposure TMP/SMX none Hypotension none Balance of 12 systems is negative w/ exceptions as above  PMH  Past Medical History:  Diagnosis Date   Arthralgia 02/14/2016   Asthma    Autoimmune disease ([place]) 02/14/2016   Positive RF 139 CCP Negative  Positive ANA 1:640 Positive Ro Positive [place] Elevated ESR 105 - Sjorgen's Disease   DDD (degenerative disc disease), lumbar  02/14/2016   Fatigue 02/14/2016   Gastritis 02/14/2016   Hot flashes    Migraines 02/14/2016   Osteoarthritis of both hands 02/14/2016   Osteoarthritis of both knees 02/14/2016   Vitamin D deficiency 02/14/2016   PSH  Past Surgical History:  Procedure Laterality Date   BREAST BIOPSY Left    ROTATOR CUFF REPAIR Right 06/16/2019   TUBAL LIGATION     FH  Family History  Problem Relation Age of Onset   Kidney failure Mother    Heart failure Mother    Lupus Mother    COPD Father    Hypertension Sister    Acromegaly Sister    Breast cancer Paternal Aunt 19   [place]  reports that she quit smoking about 35 years ago. Her smoking use included cigarettes. She has a 1.00 pack-year smoking history. She has never used smokeless tobacco. She reports that she does not drink alcohol and does not use drugs. Allergies  Allergies  Allergen Reactions   Sulfa Antibiotics Anaphylaxis, Hives and Swelling    Swelling of the face and tongue    Home medications Prior to Admission medications   Medication Sig Start Date End Date Taking? Authorizing Provider  albuterol (PROVENTIL HFA;VENTOLIN HFA) 108 (90 Base) MCG/ACT inhaler Inhale 2 puffs into the lungs 2 (two) times daily. For shortness of breath 04/15/18  Yes Glean Hess, MD  carbamazepine (TEGRETOL) 200 MG tablet Take 1 tablet daily x 1 week, then increase to 1 tablet twice daily Patient taking differently: Take 200 mg by mouth See admin instructions. Take 1 tablet daily x 1 week,  then increase to 1 tablet twice daily 11/18/21  Yes Patel, Donika K, DO  hydrochlorothiazide (HYDRODIURIL) 25 MG tablet Take 25 mg by mouth every morning. 04/26/21  Yes [provider]  montelukast (SINGULAIR) 10 MG tablet Take 1 tablet (10 mg total) by mouth at bedtime. 06/27/21  Yes Garnet Sierras, DO  omalizumab Arvid Right) 150 MG/ML prefilled syringe INJECT 2 SYRINGES UNDER THE SKIN EVERY 2 WEEKS. TOTAL DOSE =375MG Patient taking differently: Inject 300 mg into  the skin every 14 (fourteen) days. Take along with 75 mg syringe. Total = 375 mg 10/28/21  Yes Mannam, Praveen, MD  omalizumab Arvid Right) 75 MG/0.5ML prefilled syringe INJECT 1 SYRINGE UNDER THE SKIN EVERY 2 WEEKS. TOTAL DOSE = 375MG Patient taking differently: Inject 75 mg into the skin every 14 (fourteen) days. Take along with 150 mg (x2) syringes for total of 375 mg 10/28/21  Yes Mannam, Praveen, MD  omeprazole (PRILOSEC) 20 MG capsule Take 20 mg by mouth in the morning and at bedtime. 11/29/21  Yes [provider]  pantoprazole (PROTONIX) 40 MG tablet Take 1 tablet (40 mg total) by mouth daily. 06/27/21  Yes Garnet Sierras, DO  rosuvastatin (CRESTOR) 20 MG tablet Take 20 mg by mouth daily. 11/25/21  Yes [provider]  SYMBICORT 160-4.5 MCG/ACT inhaler INHALE 2 PUFFS INTO THE LUNGS TWICE DAILY Patient taking differently: Inhale 2 puffs into the lungs in the morning and at bedtime. 04/16/19  Yes Glean Hess, MD  docusate sodium (COLACE) 100 MG capsule Take 1 capsule (100 mg total) by mouth every 12 (twelve) hours. Patient not taking: Reported on 12/05/2021 07/06/21   Carlisle Cater, PA-C  fluticasone [facility]) 50 MCG/ACT nasal spray Place 1 spray into both nostrils 2 (two) times daily as needed (nasal congestion). Patient not taking: Reported on 12/05/2021 06/27/21   Garnet Sierras, DO  glycerin adult 2 g suppository Place 1 suppository rectally as needed for constipation. Patient not taking: Reported on 12/05/2021 07/06/21   Carlisle Cater, PA-C  ondansetron (ZOFRAN-ODT) 4 MG disintegrating tablet Take 1 tablet (4 mg total) by mouth every 8 (eight) hours as needed for nausea or vomiting. Patient not taking: Reported on 12/05/2021 06/22/21   Truddie Hidden, MD  polyethylene glycol powder [facility]) 17 GM/SCOOP powder Take 17 g by mouth 2 (two) times daily. Patient not taking: Reported on 12/05/2021 07/06/21   Carlisle Cater, PA-C  Spacer/Aero-Holding Chambers (AEROCHAMBER PLUS)  inhaler Use as instructed 06/27/21   Garnet Sierras, DO  valACYclovir (VALTREX) 1000 MG tablet Take 1,000 mg by mouth daily. Patient not taking: Reported on 12/05/2021 10/17/21   [provider]    Current Medications Scheduled Meds:  Chlorhexidine Gluconate Cloth  6 each Topical Daily   vancomycin variable dose per unstable renal function (pharmacist dosing)   Does not apply See admin instructions   Continuous Infusions:  sodium chloride 125 mL/hr at 12/05/21 1500   acetylcysteine 15 mg/kg/hr (12/05/21 1500)   ceFEPime (MAXIPIME) IV Stopped (12/05/21 0241)   lactated ringers Stopped (12/05/21 0614)   PRN Meds:.docusate sodium, ondansetron (ZOFRAN) IV, polyethylene glycol  CBC Recent Labs  Lab 12/04/21 1339 12/05/21 0130  WBC 1.8* 1.8*  NEUTROABS 1.3* 1.3*  HGB 13.6 11.0*  HCT 40.5 32.2*  MCV 85.1 83.6  PLT 41* 24*   Basic Metabolic Panel Recent Labs  Lab 12/04/21 1339 12/05/21 0252  NA 133* 136  K 4.0 3.9  CL 101 105  CO2 19* 14*  GLUCOSE 142* 123*  BUN 69* 82*  CREATININE 3.41* 4.28*  CALCIUM 8.4* 7.2*    Physical Exam   Blood pressure (!) 113/56, pulse 69, temperature 98.6 F (37 C), resp. rate (!) 22, weight 75.3 kg, last menstrual period 05/22/2012, SpO2 100 %. GEN: NAD, conversant, clearly feels unwell ENT: NCAT EYES: EOMI CV: Regular, normal S1 and S2 PULM: CTA B ABD: Tender to palpation, no distention, no rebound or guarding SKIN: No rashes or lesions EXT: No edema  Assessment 45F AKI, transaminitis, fevers  AKI: Hopefully this is predominantly prerenal/potential ATN UA not consistent with GN Renal ultrasound without acute structural issues Continue supportive care Continue hydration No HD indications Transaminitis: GI following, on acetylcysteine, work-up in process Pancytopenia Fevers, unclear cause, cultures pending, on Vanco and cefepime obtain Recent trigeminal neuralgia, carbamazepine prescribed  Plan As above Daily weights,  Daily Renal Panel, Strict I/Os, Avoid nephrotoxins (NSAIDs, judicious IV Contrast)   Rexene Agent  12/05/2021, 3:20 PM

## 2021-12-05 NOTE — Progress Notes (Signed)
MEDICATION RELATED CONSULT NOTE - FOLLOW UP   Pharmacy Consult for NAC  Indication: Concern of APAP overdose   Allergies  Allergen Reactions   Sulfa Antibiotics Anaphylaxis, Hives and Swelling    Swelling of the face and tongue     Patient Measurements: Weight: 75.3 kg (166 lb)   Vital Signs: Temp: 99.4 F (37.4 C) (08/21 1037) Temp Source: Bladder (08/21 1037) BP: 132/84 (08/21 1000) Pulse Rate: 79 (08/21 1000) Intake/Output from previous day: 08/20 0701 - 08/21 0700 In: 2000 [I.V.:2000] Out: 500 [Urine:500] Intake/Output from this shift: No intake/output data recorded.  Labs: Recent Labs    12/04/21 1339 12/04/21 1950 12/05/21 0130 12/05/21 0252  WBC 1.8*  --  1.8*  --   HGB 13.6  --  11.0*  --   HCT 40.5  --  32.2*  --   PLT 41*  --  24*  --   APTT  --  48*  --   --   CREATININE 3.41*  --   --  4.28*  ALBUMIN 2.9*  --   --  2.0*  PROT 9.1*  --   --  6.7  AST 9,740*  --   --  7,050*  ALT 2,902*  --   --  2,127*  ALKPHOS 84  --   --  65  BILITOT 0.9  --   --  1.5*  BILIDIR  --   --   --  1.0*  IBILI  --   --   --  0.5   Estimated Creatinine Clearance: 15.2 mL/min (A) (by C-G formula based on SCr of 4.28 mg/dL (H)).   Assessment: 56 yo female presented on 12/04/21. Patient started on NAC per Poison Control recommendation. Patient reported taking scheduled ibuprofen and acetaminophen for facial pain. Patient reported taking 2 tablets of acetaminophen every 4 hrs. Denies other over the counter medications that include acetaminophen except possible cold medication but unsure name just a couple of doses over past month. Patient recently started on carbamazepine for trigeminal nerve pain but reported never starting this medication due to not picking up but per [facility] patient picked up on 11/18/21. Carbamazepine level 4.3.   Called Poison Control 8/21 AM to update on labs.    Plan:  Per Poison Control continue NAC 15 mg/kg/hr and recheck CMP, APAP level, and INR  at 2100 on 8/21   Gerrit Halls, PharmD, BCPS Clinical Pharmacist 12/05/2021 10:55 AM

## 2021-12-05 NOTE — H&P (Addendum)
NAME:  Megan Reid, MRN:  [id number], DOB:  [birth year], LOS: 1 ADMISSION DATE:  12/04/2021, CONSULTATION DATE:  12/04/21 REFERRING MD:  ED, Hal Hope , CHIEF COMPLAINT:     History of Present Illness:  Megan Reid is a 56 yo woman with hx of sjogrens, recent trigeminal pain on R, pancytopenia, Asthma, lumbar spinal stenosis.   Here with worsening lethargy and weakness, poor po intake for several days.  About 5 days of non bloody diarrhea, 1 episode of vomiting early in the week.  Some diffuse abdominal pain x several daily.   No known risk factors for cdif.   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    Carbamazepine just started on 8/4 for her trigeminal nerve pain.  Carbamazepine level initially 4.3 (not elevated, within therapeutic window).    Per husband, she has been taking tylenol and ibuprofen for facial pain, unclear how much.  In ED received 2.5L NS (at time of my exam) Acetylcysteine started (although acetaminophen level low), cefepime, vanc.   Labs: lipase elevated.  VBG reveals compensated metabolic acidosis AKI AST >9000and ALT >2900 Tbili 0/9 --> 1.5  LDH >10000 Procal 18 Lactic 2.6  Inr 1.4  Ammonia 30s UA protein hb high , few bact Cr 4.28/82 Thrombocytopenia leukopenia  CT head neg  CT ch abd pelv pending  CXR nl  Home meds: flexeril, flonase, HCTZ,  singulair, xolair, zofran prn, protonix, symbicort, spiriva.  Carbamazepine     Pertinent  Medical History  Swimmers ear 09/2021 R trigeminal nerve pain, s/p several rounds of antibiotics and steroids. 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  Started Carbamazepine 24m daily on 8/4 x 1 week, then increased to 2060mBID  sjogrens, recent trigeminal pain on R, pancytopenia, Asthma, lumbar spinal stenosis.   Significant Hospital Events: Including procedures, antibiotic start and stop dates in addition to other  pertinent events     Interim History / Subjective:    Objective   Blood pressure (!) 134/98, pulse (!) 108, temperature (!) 103 F (39.4 C), temperature source Oral, resp. rate (!) 32, weight 75.3 kg, last menstrual period 05/22/2012, SpO2 100 %.        Intake/Output Summary (Last 24 hours) at 12/05/2021 0323 Last data filed at 12/05/2021 0047 Gross per 24 hour  Intake --  Output 2000 ml  Net -2000 ml   Filed Weights   12/04/21 2000  Weight: 75.3 kg    Examination: General: altered, arousable but falls asleep easily  HENT: NCAT  Lungs: CTAB Cardiovascular: RRR no mgr  Abdomen: diffusely mildly tender.  Warm to touch Extremities: no edema non tender Neuro: oriented to self, doesn't answer most questions GU: no foley.   Resolved Hospital Problem list     Assessment & Plan:  Acute encephalopathy:  Ammonia actually lower side.  Likely from fever and or metabolic encephalopathy and renal failure.  Received 2 L fluids, cont maint fluids.  Started antibiotics already, will continue.   Foley for urinary retention and monitoring.   CT head given thrombocytopenia.  Consider LP if source not otherwise identified.    Abd pain, transaminitis:  Consider drug induced (recently started carbamazepine), denies other ingestions, possible tylenol contributing but not clear she takes much.  Could be shock liver given likely sepsis (though not currently hypotensive). Consider autoimmune hepatitis, 1 dose steroids given. ANA, other appropriate ab ordered.   Repeat carbamazepine leve, acetaminophen level,  salycylates. Check EBV and CMV as well.  GI needs to see her, has been called and will see in AM>   Fever: 2/2 hepatitis vs infection  Cefepime and vanc added.   Cant give nsaids due to renal failure, no acetaminophen due to liver disease. Cooling blanket applied.   Procal elevated.   Thrombocytopenia  Leukopenia: check peripheral smear. Consider heme consult.   AKI: renal  consulted.   Diarrhea: Stool cultures    Best Practice (right click and "Reselect all SmartList Selections" daily)   Diet/type: NPO DVT prophylaxis: not indicated GI prophylaxis: PPI Lines: N/A Foley:  N/A Code Status:  full code Last date of multidisciplinary goals of care discussion []   Labs   CBC: Recent Labs  Lab 12/04/21 1339 12/05/21 0130  WBC 1.8* 1.8*  NEUTROABS 1.3* PENDING  HGB 13.6 11.0*  HCT 40.5 32.2*  MCV 85.1 83.6  PLT 41* 24*    Basic Metabolic Panel: Recent Labs  Lab 12/04/21 1339  NA 133*  K 4.0  CL 101  CO2 19*  GLUCOSE 142*  BUN 69*  CREATININE 3.41*  CALCIUM 8.4*   GFR: Estimated Creatinine Clearance: 19 mL/min (A) (by C-G formula based on SCr of 3.41 mg/dL (H)). Recent Labs  Lab 12/04/21 1339 12/05/21 0130 12/05/21 0232  PROCALCITON  --  18.09  --   WBC 1.8* 1.8*  --   LATICACIDVEN  --   --  2.6*    Liver Function Tests: Recent Labs  Lab 12/04/21 1339  AST 9,740*  ALT 2,902*  ALKPHOS 84  BILITOT 0.9  PROT 9.1*  ALBUMIN 2.9*   Recent Labs  Lab 12/04/21 1339  LIPASE 209*   Recent Labs  Lab 12/04/21 1950  AMMONIA 36*    ABG    Component Value Date/Time   HCO3 17.7 (L) 12/05/2021 0232   ACIDBASEDEF 5.2 (H) 12/05/2021 0232   O2SAT 80.3 12/05/2021 0232     Coagulation Profile: Recent Labs  Lab 12/04/21 1950  INR 1.4*    Cardiac Enzymes: Recent Labs  Lab 12/04/21 1931  CKTOTAL 1,110*    HbA1C: No results found for: "HGBA1C"  CBG: Recent Labs  Lab 12/05/21 0113  GLUCAP 123*    Review of Systems:   Unable   Past Medical History:  She,  has a past medical history of Arthralgia (02/14/2016), Asthma, Autoimmune disease ([place]) (02/14/2016), DDD (degenerative disc disease), lumbar (02/14/2016), Fatigue (02/14/2016), Gastritis (02/14/2016), Hot flashes, Migraines (02/14/2016), Osteoarthritis of both hands (02/14/2016), Osteoarthritis of both knees (02/14/2016), and Vitamin D deficiency (02/14/2016).    Surgical History:   Past Surgical History:  Procedure Laterality Date   BREAST BIOPSY Left    ROTATOR CUFF REPAIR Right 06/16/2019   TUBAL LIGATION       Social History:   reports that she quit smoking about 35 years ago. Her smoking use included cigarettes. She has a 1.00 pack-year smoking history. She has never used smokeless tobacco. She reports that she does not drink alcohol and does not use drugs.   Family History:  Her family history includes Acromegaly in her sister; Breast cancer (age of onset: 37) in her paternal aunt; COPD in her father; Heart failure in her mother; Hypertension in her sister; Kidney failure in her mother; Lupus in her mother.   Allergies Allergies  Allergen Reactions   Sulfa Antibiotics Anaphylaxis, Hives and Swelling    Swelling of the face and tongue      Home Medications  Prior to Admission medications  Medication Sig Start Date End Date Taking? Authorizing Provider  albuterol (PROVENTIL HFA;VENTOLIN HFA) 108 (90 Base) MCG/ACT inhaler Inhale 2 puffs into the lungs 2 (two) times daily. For shortness of breath 04/15/18   Glean Hess, MD  carbamazepine (TEGRETOL) 200 MG tablet Take 1 tablet daily x 1 week, then increase to 1 tablet twice daily 11/18/21   Narda Amber K, DO  cyclobenzaprine (FLEXERIL) 5 MG tablet Take 1 tablet (5 mg total) by mouth at bedtime as needed for muscle spasms. 04/04/21   Narda Amber K, DO  docusate sodium (COLACE) 100 MG capsule Take 1 capsule (100 mg total) by mouth every 12 (twelve) hours. 07/06/21   Carlisle Cater, PA-C  fluticasone (FLONASE) 50 MCG/ACT nasal spray Place 1 spray into both nostrils 2 (two) times daily as needed (nasal congestion). 06/27/21   Garnet Sierras, DO  glycerin adult 2 g suppository Place 1 suppository rectally as needed for constipation. 07/06/21   Carlisle Cater, PA-C  hydrochlorothiazide (HYDRODIURIL) 25 MG tablet Take 25 mg by mouth every morning. 04/26/21   [provider]   montelukast (SINGULAIR) 10 MG tablet Take 1 tablet (10 mg total) by mouth at bedtime. 06/27/21   Garnet Sierras, DO  omalizumab Arvid Right) 150 MG/ML prefilled syringe INJECT 2 SYRINGES UNDER THE SKIN EVERY 2 WEEKS. TOTAL DOSE =375MG 10/28/21   Mannam, Hart Robinsons, MD  omalizumab Arvid Right) 75 MG/0.5ML prefilled syringe INJECT 1 SYRINGE UNDER THE SKIN EVERY 2 WEEKS. TOTAL DOSE = 375MG 10/28/21   Mannam, Praveen, MD  ondansetron (ZOFRAN-ODT) 4 MG disintegrating tablet Take 1 tablet (4 mg total) by mouth every 8 (eight) hours as needed for nausea or vomiting. 06/22/21   Truddie Hidden, MD  pantoprazole (PROTONIX) 40 MG tablet Take 1 tablet (40 mg total) by mouth daily. 06/27/21   Garnet Sierras, DO  polyethylene glycol powder (GLYCOLAX/MIRALAX) 17 GM/SCOOP powder Take 17 g by mouth 2 (two) times daily. 07/06/21   Carlisle Cater, PA-C  Spacer/Aero-Holding Chambers (AEROCHAMBER PLUS) inhaler Use as instructed 06/27/21   Garnet Sierras, DO  SYMBICORT 160-4.5 MCG/ACT inhaler INHALE 2 PUFFS INTO THE LUNGS TWICE DAILY 04/16/19   Glean Hess, MD  Tiotropium Bromide Monohydrate (SPIRIVA RESPIMAT) 2.5 MCG/ACT AERS Inhale 2 puffs into the lungs daily. 08/06/20   Marshell Garfinkel, MD     Critical care time: 45 min

## 2021-12-05 NOTE — Progress Notes (Signed)
Pharmacy Antibiotic Note  Megan Reid is a 56 y.o. female admitted on 12/04/2021 with sepsis.  Pharmacy has been consulted for vancomcyin and cefepime dosing. The patient presents with abdominal pain and diarrhea for couple days. Some concern for possible tylenol overdose, poison control has been contacted. She has developed fever 99.9>103 over the past few hours in the emergency department. WBC 1.8, Noted AKI sCr 3.41 up from 1.2 in 06/2021. AST 9740, ALT 2902, INR 1.4  Plan: Vancomycin 1500MG  IV x1 will need to order additional doses Cefepime 2G IV q24 hours  Weight: 75.3 kg (166 lb)  Temp (24hrs), Avg:101.2 F (38.4 C), Min:99.9 F (37.7 C), Max:103 F (39.4 C)  Recent Labs  Lab 12/04/21 1339  WBC 1.8*  CREATININE 3.41*    Estimated Creatinine Clearance: 19 mL/min (A) (by C-G formula based on SCr of 3.41 mg/dL (H)).    Allergies  Allergen Reactions   Sulfa Antibiotics Anaphylaxis, Hives and Swelling    Swelling of the face and tongue     Antimicrobials this admission: Vancomcyin 8/21>> Cefepime 8/21>>  Microbiology results: pending  Thank you for allowing pharmacy to be a part of this patient's care.  9/21 Wagner Tanzi 12/05/2021 12:46 AM

## 2021-12-05 NOTE — ED Notes (Signed)
Provider at bedside

## 2021-12-05 NOTE — Consult Note (Signed)
Referring Provider: Collier Bullock, MD Primary Care Physician:  Leeroy Cha, MD Primary Gastroenterologist:  Dr. Stacie [facility]  Reason for Consultation:  Elevated LFTs  HPI: Megan Reid is a 56 y.o. female with history of Sjogren's syndrome, recent trigeminal pain on right side, pancytopenia, asthma, lumbar spinal stenosis, GERD, who presented to the ED 12/05/2021 with worsening lethargy and weakness, and poor p.o. intake for several days.  Reported about 5 days of nonbloody diarrhea, 1 episode of vomiting earlier in the week.  Some diffuse abdominal pain for several days.  Patient had MMR vaccine and hepatitis vaccine Tuesday of last week.  Started carbamazepine on 8/4 for trigeminal nerve pain.    Patient has received 2.5 L normal saline, acetylcysteine started though acetaminophen level low, also started on cefepime and vancomycin.  Labs significant for elevated lipase, metabolic acidosis, AKI, AST over 9000 and ALT over 2900, T. bili from 0.9 --> 1.5, LDH greater than 10,000, thrombocytopenia and leukopenia.  Negative CT head and chest x-ray.  CT chest, abdomen, pelvis 12/05/2021 shows no acute intrathoracic or intra-abdominal pathology.  Bronchial wall thickening in keeping with airway inflammation.  Minimal bilateral nonobstructing nephrolithiasis.  Cholelithiasis.    Problem list currently includes acute encephalopathy, abdominal pain and transaminitis, fever secondary to hepatitis versus infection, AKI, diarrhea, leukopenia and thrombocytopenia.  GI is consulted due to transaminitis. A dose of steroids has been given for consideration of autoimmune hepatitis.  ANA and other labs pending.  Patient seen and examined at bedside this morning.  There is no family in the room at time of my visit.  She states the last time she felt well was 1 week ago on Monday.  States that Tuesday she received MMR and hepatitis B vaccine, and started feeling poorly later that day.   Developed fever, nausea, weakness, and nonbloody diarrhea.  Denies hematochezia or melena.  Denies recent travel or sick contacts.  Denies any family history of GI malignancy or disease including liver disease.  Denies history of similar episodes.  She was alternating between Tylenol and ibuprofen every 4 hours for pain and fever.  Denies taking more than recommended dose.  Denies history of MI/stroke.  Denies blood thinner use.  States that she had some confusion at home as well but today is alert and oriented to person, place, and situation.  Patient states she was prescribed carbamazepine earlier this month but denies starting the medication.  She does report some difficulty with swallowing since onset of symptoms.  States she has feeling that liquids and solids get stuck in her throat.  Denies any regurgitation or vomiting.  Just had a normal EGD on 11/29/2021. Reports she last ate 3 days ago.  She does have an appetite and denies any abdominal pain at rest.  She is established with Dr. Paulita Fujita. Last colonoscopy 04/13/2020 - Entire colon normal, recall 5 years.  Patient underwent EGD 11/29/2021 which was normal.   Past Medical History:  Diagnosis Date   Arthralgia 02/14/2016   Asthma    Autoimmune disease ([place]) 02/14/2016   Positive RF 139 CCP Negative  Positive ANA 1:640 Positive Ro Positive La Elevated ESR 105 - Sjorgen's Disease   DDD (degenerative disc disease), lumbar 02/14/2016   Fatigue 02/14/2016   Gastritis 02/14/2016   Hot flashes    Migraines 02/14/2016   Osteoarthritis of both hands 02/14/2016   Osteoarthritis of both knees 02/14/2016   Vitamin D deficiency 02/14/2016    Past Surgical History:  Procedure Laterality Date  BREAST BIOPSY Left    ROTATOR CUFF REPAIR Right 06/16/2019   TUBAL LIGATION      Prior to Admission medications   Medication Sig Start Date End Date Taking? Authorizing Provider  albuterol (PROVENTIL HFA;VENTOLIN HFA) 108 (90 Base) MCG/ACT  inhaler Inhale 2 puffs into the lungs 2 (two) times daily. For shortness of breath 04/15/18   Glean Hess, MD  carbamazepine (TEGRETOL) 200 MG tablet Take 1 tablet daily x 1 week, then increase to 1 tablet twice daily 11/18/21   Narda Amber K, DO  cyclobenzaprine (FLEXERIL) 5 MG tablet Take 1 tablet (5 mg total) by mouth at bedtime as needed for muscle spasms. 04/04/21   Narda Amber K, DO  docusate sodium (COLACE) 100 MG capsule Take 1 capsule (100 mg total) by mouth every 12 (twelve) hours. 07/06/21   Carlisle Cater, PA-C  fluticasone (FLONASE) 50 MCG/ACT nasal spray Place 1 spray into both nostrils 2 (two) times daily as needed (nasal congestion). 06/27/21   Garnet Sierras, DO  glycerin adult 2 g suppository Place 1 suppository rectally as needed for constipation. 07/06/21   Carlisle Cater, PA-C  hydrochlorothiazide (HYDRODIURIL) 25 MG tablet Take 25 mg by mouth every morning. 04/26/21   [provider]  montelukast (SINGULAIR) 10 MG tablet Take 1 tablet (10 mg total) by mouth at bedtime. 06/27/21   Garnet Sierras, DO  omalizumab Arvid Right) 150 MG/ML prefilled syringe INJECT 2 SYRINGES UNDER THE SKIN EVERY 2 WEEKS. TOTAL DOSE =375MG 10/28/21   Mannam, Hart Robinsons, MD  omalizumab Arvid Right) 75 MG/0.5ML prefilled syringe INJECT 1 SYRINGE UNDER THE SKIN EVERY 2 WEEKS. TOTAL DOSE = 375MG 10/28/21   Mannam, Praveen, MD  ondansetron (ZOFRAN-ODT) 4 MG disintegrating tablet Take 1 tablet (4 mg total) by mouth every 8 (eight) hours as needed for nausea or vomiting. 06/22/21   Truddie Hidden, MD  pantoprazole (PROTONIX) 40 MG tablet Take 1 tablet (40 mg total) by mouth daily. 06/27/21   Garnet Sierras, DO  polyethylene glycol powder (GLYCOLAX/MIRALAX) 17 GM/SCOOP powder Take 17 g by mouth 2 (two) times daily. 07/06/21   Carlisle Cater, PA-C  Spacer/Aero-Holding Chambers (AEROCHAMBER PLUS) inhaler Use as instructed 06/27/21   Garnet Sierras, DO  SYMBICORT 160-4.5 MCG/ACT inhaler INHALE 2 PUFFS INTO THE LUNGS TWICE DAILY  04/16/19   Glean Hess, MD  Tiotropium Bromide Monohydrate (SPIRIVA RESPIMAT) 2.5 MCG/ACT AERS Inhale 2 puffs into the lungs daily. 08/06/20   Marshell Garfinkel, MD    Scheduled Meds:  vancomycin variable dose per unstable renal function (pharmacist dosing)   Does not apply See admin instructions   Continuous Infusions:  sodium chloride 125 mL/hr at 12/05/21 0616   acetylcysteine 15 mg/kg/hr (12/05/21 3557)   ceFEPime (MAXIPIME) IV Stopped (12/05/21 0241)   lactated ringers Stopped (12/05/21 3220)   PRN Meds:.docusate sodium, polyethylene glycol  Allergies as of 12/04/2021 - Review Complete 12/04/2021  Allergen Reaction Noted   Sulfa antibiotics Anaphylaxis, Hives, and Swelling 12/06/2011    Family History  Problem Relation Age of Onset   Kidney failure Mother    Heart failure Mother    Lupus Mother    COPD Father    Hypertension Sister    Acromegaly Sister    Breast cancer Paternal Aunt 30    Social History   Socioeconomic History   Marital status: Married    Spouse name: Not on file   Number of children: 4   Years of education: 16   Highest education level: Not  on file  Occupational History   Occupation: Pharmacist, hospital  Tobacco Use   Smoking status: Former    Packs/day: 0.50    Years: 2.00    Total pack years: 1.00    Types: Cigarettes    Quit date: 05/16/1986    Years since quitting: 35.5   Smokeless tobacco: Never   Tobacco comments:    TEEN AGER  Vaping Use   Vaping Use: Never used  Substance and Sexual Activity   Alcohol use: No   Drug use: No   Sexual activity: Not on file  Other Topics Concern   Not on file  Social History Narrative   Lives with husband.  Has 4 children.  Teacher.  Education: college.       Right Handed    Lives in a one story home    Drinks caffeine   Social Determinants of Health   Financial Resource Strain: Not on file  Food Insecurity: Not on file  Transportation Needs: Not on file  Physical Activity: Not on file   Stress: Not on file  Social Connections: Not on file  Intimate Partner Violence: Not on file    Review of Systems: Review of Systems  Constitutional:  Positive for chills, fever and malaise/fatigue. Negative for weight loss.  HENT:  Negative for ear pain.   Eyes:  Negative for discharge and redness.  Respiratory:  Negative for cough, hemoptysis and stridor.   Cardiovascular:  Negative for chest pain and leg swelling.  Gastrointestinal:  Positive for diarrhea and nausea. Negative for abdominal pain, blood in stool, constipation, heartburn, melena and vomiting.  Genitourinary:  Negative for dysuria and hematuria.  Musculoskeletal:  Negative for falls and joint pain.  Skin:  Negative for itching and rash.  Neurological:  Positive for weakness. Negative for seizures and loss of consciousness.  Endo/Heme/Allergies:  Negative for environmental allergies. Does not bruise/bleed easily.  Psychiatric/Behavioral:  Negative for hallucinations. The patient is not nervous/anxious.      Physical Exam: Physical Exam Vitals reviewed.  Constitutional:      Appearance: She is ill-appearing.  HENT:     Head: Normocephalic and atraumatic.     Right Ear: External ear normal.     Left Ear: External ear normal.     Nose: Nose normal.     Mouth/Throat:     Mouth: Mucous membranes are moist.     Pharynx: Oropharynx is clear.  Eyes:     General: No scleral icterus.    Extraocular Movements: Extraocular movements intact.     Conjunctiva/sclera: Conjunctivae normal.  Cardiovascular:     Rate and Rhythm: Normal rate and regular rhythm.     Pulses: Normal pulses.     Heart sounds: Normal heart sounds. No murmur heard. Pulmonary:     Breath sounds: Normal breath sounds. No stridor.  Abdominal:     General: Abdomen is flat. There is no distension.     Palpations: Abdomen is soft.     Tenderness: There is abdominal tenderness. There is no guarding or rebound.     Comments: Bowel sounds sluggish   Musculoskeletal:     Cervical back: Normal range of motion and neck supple.     Right lower leg: No edema.     Left lower leg: No edema.  Skin:    General: Skin is warm and dry.     Coloration: Skin is not jaundiced.  Neurological:     General: No focal deficit present.     Mental Status:  She is alert and oriented to person, place, and time.  Psychiatric:        Behavior: Behavior normal.        Thought Content: Thought content normal.      Vital signs: Vitals:   12/05/21 0701 12/05/21 0738  BP: 120/78   Pulse: 93   Resp: (!) 24   Temp: (!) 101.1 F (38.4 C) (!) 100.5 F (38.1 C)  SpO2: 99%         GI:  Lab Results: Recent Labs    12/04/21 1339 12/05/21 0130  WBC 1.8* 1.8*  HGB 13.6 11.0*  HCT 40.5 32.2*  PLT 41* 24*   BMET Recent Labs    12/04/21 1339 12/05/21 0252  NA 133* 136  K 4.0 3.9  CL 101 105  CO2 19* 14*  GLUCOSE 142* 123*  BUN 69* 82*  CREATININE 3.41* 4.28*  CALCIUM 8.4* 7.2*   LFT Recent Labs    12/05/21 0252  PROT 6.7  ALBUMIN 2.0*  AST 7,050*  ALT 2,127*  ALKPHOS 65  BILITOT 1.5*  BILIDIR 1.0*  IBILI 0.5   PT/INR Recent Labs    12/04/21 1950  LABPROT 17.0*  INR 1.4*     Studies/Results: CT CHEST ABDOMEN PELVIS WO CONTRAST  Result Date: 12/05/2021 CLINICAL DATA:  Altered mental status, sepsis EXAM: CT CHEST, ABDOMEN AND PELVIS WITHOUT CONTRAST TECHNIQUE: Multidetector CT imaging of the chest, abdomen and pelvis was performed following the standard protocol without IV contrast. RADIATION DOSE REDUCTION: This exam was performed according to the departmental dose-optimization program which includes automated exposure control, adjustment of the mA and/or kV according to patient size and/or use of iterative reconstruction technique. COMPARISON:  CT abdomen pelvis 07/15/2021, CT chest 03/17/2021 FINDINGS: CT CHEST FINDINGS Cardiovascular: No significant coronary artery calcification. Global cardiac size within normal limits.  No pericardial effusion. Central pulmonary arteries are of normal caliber. Mild atherosclerotic calcification within the thoracic aorta. No aortic aneurysm. Mediastinum/Nodes: No enlarged mediastinal, hilar, or axillary lymph nodes. Thyroid gland, trachea, and esophagus demonstrate no significant findings. Lungs/Pleura: Previously noted left lower lobe subpleural pulmonary nodule has resolved in keeping with an infectious or inflammatory process. Lungs are clear. No pneumothorax or pleural effusion. Bronchial wall thickening is present in keeping with airway inflammation. No central obstructing lesion. Musculoskeletal: No acute bone abnormality. No lytic or blastic bone lesion. CT ABDOMEN PELVIS FINDINGS Hepatobiliary: Layering hyperdense material within the gallbladder lumen represents probable inspissated sludge or tiny stones. No pericholecystic inflammatory change. The liver is unremarkable on this noncontrast examination. No intra or extrahepatic biliary ductal dilation. Pancreas: Unremarkable Spleen: Unremarkable Adrenals/Urinary Tract: The adrenal glands are unremarkable. The kidneys are normal in size and position. Punctate 1-2 mm nonobstructing calculi are noted within the interpolar region of the kidneys bilaterally. No ureteral calculi. No hydronephrosis. No perinephric fluid collections. Foley catheter balloon is seen within a decompressed bladder lumen. Stomach/Bowel: Stomach is within normal limits. Appendix absent. No evidence of bowel wall thickening, distention, or inflammatory changes. Vascular/Lymphatic: Mild atherosclerotic calcification within the left common iliac artery. No aortic aneurysm. No pathologic adenopathy within the abdomen and pelvis. Reproductive: Uterus and bilateral adnexa are unremarkable. Other: Tiny fat containing umbilical hernia. Musculoskeletal: Stable grade 1-2 anterolisthesis L5-S1. No acute bone abnormality. No lytic or blastic bone lesion. IMPRESSION: 1. No acute  intrathoracic or intra-abdominal pathology identified. No definite radiographic explanation for the patient's reported symptoms. 2. Bronchial wall thickening in keeping with airway inflammation. 3. Minimal bilateral nonobstructing nephrolithiasis. 4. Cholelithiasis.  5. Stable grade 1-2 anterolisthesis L5-S1. Electronically Signed   By: Fidela Salisbury M.D.   On: 12/05/2021 03:34   CT HEAD WO CONTRAST (5MM)  Result Date: 12/05/2021 CLINICAL DATA:  Altered mental status, nontraumatic (Ped 0-17y). Sepsis. EXAM: CT HEAD WITHOUT CONTRAST TECHNIQUE: Contiguous axial images were obtained from the base of the skull through the vertex without intravenous contrast. RADIATION DOSE REDUCTION: This exam was performed according to the departmental dose-optimization program which includes automated exposure control, adjustment of the mA and/or kV according to patient size and/or use of iterative reconstruction technique. COMPARISON:  None Available. FINDINGS: Brain: Normal anatomic configuration. No abnormal intra or extra-axial mass lesion or fluid collection. No abnormal mass effect or midline shift. No evidence of acute intracranial hemorrhage or infarct. Ventricular size is normal. Cerebellum unremarkable. Vascular: Unremarkable Skull: Intact Sinuses/Orbits: There is moderate mucosal thickening within the frontal and maxillary sinuses bilaterally with opacification of several anterior ethmoid air cells bilaterally. Remaining paranasal sinuses are clear. Orbits are unremarkable. Other: Mastoid air cells and middle ear cavities are clear. IMPRESSION: 1. No acute intracranial abnormality. 2. Moderate paranasal sinus disease. Electronically Signed   By: Fidela Salisbury M.D.   On: 12/05/2021 03:20   US Abdomen Complete  Result Date: 12/04/2021 CLINICAL DATA:  Abnormal liver function tests, renal dysfunction EXAM: ABDOMEN ULTRASOUND COMPLETE COMPARISON:  CT done on 07/05/2021 FINDINGS: Gallbladder: No gallstones or wall  thickening visualized. No sonographic Murphy sign noted by sonographer. Common bile duct: Diameter: 2.5 mm Liver: There is slightly increased echogenicity in the liver. No focal abnormalities are seen. Portal vein is patent on color Doppler imaging with normal direction of blood flow towards the liver. IVC: No abnormality visualized. Pancreas: Visualized portion unremarkable. Spleen: Size and appearance within normal limits. Right Kidney: Length: 9.5 cm. There is increased cortical echogenicity. There is no hydronephrosis. Left Kidney: Length: 11 cm. There is increased echogenicity. There is no hydronephrosis. Small left renal calculus seen in the previous CT is not distinctly visualized. Abdominal aorta: No aneurysm visualized. Other findings: None. IMPRESSION: Fatty liver. Increased cortical echogenicity in both kidneys suggests medical renal disease. There is no hydronephrosis. Abdomen sonogram is otherwise unremarkable. Electronically Signed   By: Elmer Picker M.D.   On: 12/04/2021 20:23   DG Chest 2 View  Result Date: 12/04/2021 CLINICAL DATA:  Not feeling well for 3 days. EXAM: CHEST - 2 VIEW COMPARISON:  Chest radiograph 07/05/2021 FINDINGS: The cardiomediastinal silhouette is normal. There is no focal consolidation or pulmonary edema. There is no pleural effusion or pneumothorax There is no acute osseous abnormality. IMPRESSION: No radiographic evidence of acute cardiopulmonary process. Electronically Signed   By: Valetta Mole M.D.   On: 12/04/2021 16:57    Impression: Acute hepatitis, transaminitis - CT A/P 12/05/2021 unrevealing - Most recent labs 12/05/2021 show AST of 7050, ALT 2127, T. bili 1.5 (had normal LFTs as recently as 07/05/2021) - CK elevated to 1110 - Hemoglobin 11, WBC 1.8, platelets 24 - CMV, EBV, ferritin, stool culture, ANA, ASMA, ANA all pending - Carbamazepine level normal, repeat pending (patient states she has a prescription for this but denies taking any doses.  No  family in room to confirm). - Diffusely tender to palpation of abdomen but no pain at rest - Reports alternating between Tylenol and ibuprofen every 4 hours at home, denies taking more than recommended dose.  Low acetaminophen level.  Low salicylate level. - No recent travel or sick contacts - Acute hepatitis panel negative  AKI -  BUN 82, creatinine 4.28, GFR 11  Plan: Patient with acute transaminitis with symptoms of fever, nausea, weakness, and diarrhea for about 6 days. Onset following immunizations for MMR and hepatitis B.  Acute hepatitis panel was negative; however, multiple additional labs are pending including CMV, EBV, autoimmune markers. Differential also includes shock liver, rhabdomyolysis, and drug-induced hepatitis.  Will await lab results.  Dr. Hubbard Hartshorn to see patient later today. Continue antibiotics and IV fluids. Continue supportive care. Continue to trend LFTs. [facility] will follow.   LOS: 1 day   Angelique Holm  PA-C 12/05/2021, 8:08 AM  Contact #  [phone]

## 2021-12-05 NOTE — ED Notes (Signed)
Paged CCM regarding nausea meds and parameters for cooling blanket.

## 2021-12-05 NOTE — Progress Notes (Signed)
eLink Physician-Brief Progress Note Patient Name: Megan Reid DOB: 1966/02/06 MRN: [id number]   Date of Service  12/05/2021  HPI/Events of Note  -Contacted by [place] tech regarding duplicate order: [place] limited ordered -Complete US 8/20 with fatty liver and increased echogenicity in both kidneys -LFTs down trending  eICU Interventions  Discontinued [place] limited order        Dillie Burandt Mechele Collin 12/05/2021, 8:40 PM

## 2021-12-06 ENCOUNTER — Ambulatory Visit: Payer: BC Managed Care – PPO

## 2021-12-06 ENCOUNTER — Encounter: Payer: BC Managed Care – PPO | Admitting: Physical Therapy

## 2021-12-06 DIAGNOSIS — B179 Acute viral hepatitis, unspecified: Secondary | ICD-10-CM | POA: Diagnosis not present

## 2021-12-06 LAB — ENA+DNA/DS+ANTICH+CENTRO+JO...
Anti JO-1: <0.2 AI (ref 0.0–0.9)
Centromere Ab Screen: <0.2 AI (ref 0.0–0.9)
Chromatin Ab SerPl-aCnc: 0.3 AI (ref 0.0–0.9)
ENA SM Ab Ser-aCnc: 0.5 AI (ref 0.0–0.9)
Ribonucleic Protein: <0.2 AI (ref 0.0–0.9)
SSA (Ro) (ENA) Antibody, IgG: >8 AI — ABNORMAL HIGH (ref 0.0–0.9)
SSB (La) (ENA) Antibody, IgG: >8 AI — ABNORMAL HIGH (ref 0.0–0.9)
Scleroderma (Scl-70) (ENA) Antibody, IgG: <0.2 AI (ref 0.0–0.9)
ds DNA Ab: <1 IU/mL (ref 0–9)

## 2021-12-06 LAB — COMPREHENSIVE METABOLIC PANEL
ALT: 1894 U/L — ABNORMAL HIGH (ref 0–44)
ALT: 2031 U/L — ABNORMAL HIGH (ref 0–44)
AST: 6541 U/L — ABNORMAL HIGH (ref 15–41)
AST: 5812 U/L — ABNORMAL HIGH (ref 15–41)
Albumin: 1.8 g/dL — ABNORMAL LOW (ref 3.5–5.0)
Albumin: 1.9 g/dL — ABNORMAL LOW (ref 3.5–5.0)
Alkaline Phosphatase: 61 U/L (ref 38–126)
Alkaline Phosphatase: 63 U/L (ref 38–126)
Anion gap: 16 — ABNORMAL HIGH (ref 5–15)
Anion gap: 21 — ABNORMAL HIGH (ref 5–15)
BUN: 87 mg/dL — ABNORMAL HIGH (ref 6–20)
BUN: 95 mg/dL — ABNORMAL HIGH (ref 6–20)
CO2: 11 mmol/L — ABNORMAL LOW (ref 22–32)
CO2: 12 mmol/L — ABNORMAL LOW (ref 22–32)
Calcium: 6.6 mg/dL — ABNORMAL LOW (ref 8.9–10.3)
Calcium: 7.1 mg/dL — ABNORMAL LOW (ref 8.9–10.3)
Chloride: 110 mmol/L (ref 98–111)
Chloride: 107 mmol/L (ref 98–111)
Creatinine, Ser: 4.03 mg/dL — ABNORMAL HIGH (ref 0.44–1.00)
Creatinine, Ser: 4.3 mg/dL — ABNORMAL HIGH (ref 0.44–1.00)
GFR, Estimated: 12 mL/min — ABNORMAL LOW (ref >60)
GFR, Estimated: 11 mL/min — ABNORMAL LOW (ref >60)
Glucose, Bld: 137 mg/dL — ABNORMAL HIGH (ref 70–99)
Glucose, Bld: 153 mg/dL — ABNORMAL HIGH (ref 70–99)
Potassium: 3 mmol/L — ABNORMAL LOW (ref 3.5–5.1)
Potassium: 3 mmol/L — ABNORMAL LOW (ref 3.5–5.1)
Sodium: 137 mmol/L (ref 135–145)
Sodium: 140 mmol/L (ref 135–145)
Total Bilirubin: 2 mg/dL — ABNORMAL HIGH (ref 0.3–1.2)
Total Bilirubin: 2.6 mg/dL — ABNORMAL HIGH (ref 0.3–1.2)
Total Protein: 6.7 g/dL (ref 6.5–8.1)
Total Protein: 6.9 g/dL (ref 6.5–8.1)

## 2021-12-06 LAB — BASIC METABOLIC PANEL
Anion gap: 22 — ABNORMAL HIGH (ref 5–15)
Anion gap: 19 — ABNORMAL HIGH (ref 5–15)
BUN: 93 mg/dL — ABNORMAL HIGH (ref 6–20)
BUN: 92 mg/dL — ABNORMAL HIGH (ref 6–20)
CO2: 18 mmol/L — ABNORMAL LOW (ref 22–32)
CO2: 22 mmol/L (ref 22–32)
Calcium: 7.1 mg/dL — ABNORMAL LOW (ref 8.9–10.3)
Calcium: 7.2 mg/dL — ABNORMAL LOW (ref 8.9–10.3)
Chloride: 103 mmol/L (ref 98–111)
Chloride: 100 mmol/L (ref 98–111)
Creatinine, Ser: 4.19 mg/dL — ABNORMAL HIGH (ref 0.44–1.00)
Creatinine, Ser: 3.97 mg/dL — ABNORMAL HIGH (ref 0.44–1.00)
GFR, Estimated: 12 mL/min — ABNORMAL LOW (ref >60)
GFR, Estimated: 13 mL/min — ABNORMAL LOW (ref >60)
Glucose, Bld: 168 mg/dL — ABNORMAL HIGH (ref 70–99)
Glucose, Bld: 114 mg/dL — ABNORMAL HIGH (ref 70–99)
Potassium: 2.9 mmol/L — ABNORMAL LOW (ref 3.5–5.1)
Potassium: 3.1 mmol/L — ABNORMAL LOW (ref 3.5–5.1)
Sodium: 143 mmol/L (ref 135–145)
Sodium: 141 mmol/L (ref 135–145)

## 2021-12-06 LAB — CBC
HCT: 32.1 % — ABNORMAL LOW (ref 36.0–46.0)
HCT: 33.1 % — ABNORMAL LOW (ref 36.0–46.0)
Hemoglobin: 11.5 g/dL — ABNORMAL LOW (ref 12.0–15.0)
Hemoglobin: 11.9 g/dL — ABNORMAL LOW (ref 12.0–15.0)
MCH: 28.7 pg (ref 26.0–34.0)
MCH: 28.5 pg (ref 26.0–34.0)
MCHC: 35.8 g/dL (ref 30.0–36.0)
MCHC: 36 g/dL (ref 30.0–36.0)
MCV: 80 fL (ref 80.0–100.0)
MCV: 79.2 fL — ABNORMAL LOW (ref 80.0–100.0)
Platelets: 20 10*3/uL — CL (ref 150–400)
Platelets: 19 10*3/uL — CL (ref 150–400)
RBC: 4.01 MIL/uL (ref 3.87–5.11)
RBC: 4.18 MIL/uL (ref 3.87–5.11)
RDW: 14.3 % (ref 11.5–15.5)
RDW: 14 % (ref 11.5–15.5)
WBC: 2.5 10*3/uL — ABNORMAL LOW (ref 4.0–10.5)
WBC: 2.9 10*3/uL — ABNORMAL LOW (ref 4.0–10.5)
nRBC: 0 % (ref 0.0–0.2)
nRBC: 0 % (ref 0.0–0.2)

## 2021-12-06 LAB — TECHNOLOGIST SMEAR REVIEW
Plt Morphology: NORMAL
RBC MORPHOLOGY: NONE SEEN

## 2021-12-06 LAB — PHOSPHORUS: Phosphorus: 4.5 mg/dL (ref 2.5–4.6)

## 2021-12-06 LAB — MITOCHONDRIAL ANTIBODIES: Mitochondrial M2 Ab, IgG: <20 Units (ref 0.0–20.0)

## 2021-12-06 LAB — ROCKY MTN SPOTTED FVR ABS PNL(IGG+IGM)
RMSF IgG: NEGATIVE
RMSF IgM: 0.44 index (ref 0.00–0.89)

## 2021-12-06 LAB — DIFFERENTIAL
Abs Immature Granulocytes: 0.02 10*3/uL (ref 0.00–0.07)
Basophils Absolute: 0 10*3/uL (ref 0.0–0.1)
Basophils Relative: 0 %
Eosinophils Absolute: 0 10*3/uL (ref 0.0–0.5)
Eosinophils Relative: 0 %
Immature Granulocytes: 1 %
Lymphocytes Relative: 16 %
Lymphs Abs: 0.5 10*3/uL — ABNORMAL LOW (ref 0.7–4.0)
Monocytes Absolute: 0 10*3/uL — ABNORMAL LOW (ref 0.1–1.0)
Monocytes Relative: 1 %
Neutro Abs: 2.3 10*3/uL (ref 1.7–7.7)
Neutrophils Relative %: 82 %

## 2021-12-06 LAB — ANTI-MICROSOMAL ANTIBODY LIVER / KIDNEY: LKM1 Ab: 1.4 Units (ref 0.0–20.0)

## 2021-12-06 LAB — IGG: IgG (Immunoglobin G), Serum: 2810 mg/dL — ABNORMAL HIGH (ref 586–1602)

## 2021-12-06 LAB — CARBAMAZEPINE LEVEL, TOTAL: Carbamazepine Lvl: <2 ug/mL — ABNORMAL LOW (ref 4.0–12.0)

## 2021-12-06 LAB — CK: Total CK: 855 U/L — ABNORMAL HIGH (ref 38–234)

## 2021-12-06 LAB — EPSTEIN BARR VRS(EBV DNA BY PCR): EBV DNA QN by PCR: NEGATIVE IU/mL

## 2021-12-06 LAB — ANTI-SMOOTH MUSCLE ANTIBODY, IGG: F-Actin IgG: 15 Units (ref 0–19)

## 2021-12-06 LAB — LYME DISEASE SEROLOGY W/REFLEX: Lyme Total Antibody EIA: NEGATIVE

## 2021-12-06 LAB — CMV IGM: CMV IgM: <30 AU/mL (ref 0.0–29.9)

## 2021-12-06 LAB — ANA W/REFLEX IF POSITIVE: Anti Nuclear Antibody (ANA): POSITIVE — AB

## 2021-12-06 LAB — CMV ANTIBODY, IGG (EIA): CMV Ab - IgG: <0.6 U/mL (ref 0.00–0.59)

## 2021-12-06 LAB — HAPTOGLOBIN: Haptoglobin: <10 mg/dL — ABNORMAL LOW (ref 33–346)

## 2021-12-06 LAB — PROCALCITONIN: Procalcitonin: 18.61 ng/mL

## 2021-12-06 LAB — MAGNESIUM: Magnesium: 2.3 mg/dL (ref 1.7–2.4)

## 2021-12-06 MED ORDER — HYDROMORPHONE HCL 1 MG/ML IJ SOLN
0.5000 mg | INTRAMUSCULAR | Status: DC | PRN
  Administered 2021-12-06: 0.5 mg via INTRAVENOUS
  Filled 2021-12-06: qty 0.5

## 2021-12-06 MED ORDER — POTASSIUM CHLORIDE 10 MEQ/100ML IV SOLN
10.0000 meq | INTRAVENOUS | Status: AC
  Administered 2021-12-06 (×4): 10 meq via INTRAVENOUS
  Filled 2021-12-06 (×4): qty 100

## 2021-12-06 MED ORDER — STERILE WATER FOR INJECTION IV SOLN
INTRAVENOUS | Status: DC
  Filled 2021-12-06 (×6): qty 1000

## 2021-12-06 MED ORDER — POTASSIUM CHLORIDE 10 MEQ/100ML IV SOLN
10.0000 meq | INTRAVENOUS | Status: AC
  Administered 2021-12-06 (×6): 10 meq via INTRAVENOUS
  Filled 2021-12-06 (×2): qty 100

## 2021-12-06 MED ORDER — POTASSIUM CHLORIDE CRYS ER 20 MEQ PO TBCR: 20.0000 meq | EXTENDED_RELEASE_TABLET | ORAL | Status: DC

## 2021-12-06 MED ORDER — VITAMIN K1 10 MG/ML IJ SOLN
10.0000 mg | Freq: Every day | INTRAVENOUS | Status: AC
  Administered 2021-12-06 – 2021-12-08 (×3): 10 mg via INTRAVENOUS
  Filled 2021-12-06 (×4): qty 1

## 2021-12-06 MED ORDER — SODIUM BICARBONATE 8.4 % IV SOLN
100.0000 meq | Freq: Once | INTRAVENOUS | Status: AC
  Administered 2021-12-06: 100 meq via INTRAVENOUS
  Filled 2021-12-06: qty 100

## 2021-12-06 MED ORDER — POTASSIUM CHLORIDE 10 MEQ/50ML IV SOLN: 10.0000 meq | INTRAVENOUS | Status: DC

## 2021-12-06 MED ORDER — FLUTICASONE PROPIONATE 50 MCG/ACT NA SUSP
2.0000 | Freq: Every day | NASAL | Status: DC
  Administered 2021-12-06 – 2021-12-10 (×5): 2 via NASAL
  Filled 2021-12-06 (×2): qty 16

## 2021-12-06 NOTE — Progress Notes (Signed)
[facility] Gastroenterology Progress Note  Megan Reid 56 y.o. September 18, 1965   Subjective: Weak. Abdominal pain. Tolerating clear liquids.  Objective: Vital signs: Vitals:   12/06/21 1000 12/06/21 1100  BP: 127/81 126/80  Pulse:  88  Resp: (!) 36 (!) 26  Temp: 98.8 F (37.1 C) 99.3 F (37.4 C)  SpO2:  97%    Physical Exam: Gen: lethargic, no acute distress, well-nourished HEENT: anicteric sclera CV: RRR Chest: CTA B Abd: diffuse tenderness with guarding, soft, nondistended, +BS Ext: no edema  Lab Results: Recent Labs    12/06/21 0111 12/06/21 1137  NA 140 143  K 3.0* 2.9*  CL 107 103  CO2 12* 18*  GLUCOSE 153* 168*  BUN 95* 93*  CREATININE 4.30* 4.19*  CALCIUM 7.1* 7.1*  MG 2.3  --   PHOS 4.5  --    Recent Labs    12/05/21 2232 12/06/21 0111  AST 6,541* 5,812*  ALT 1,894* 2,031*  ALKPHOS 61 63  BILITOT 2.0* 2.6*  PROT 6.7 6.9  ALBUMIN 1.8* 1.9*   Recent Labs    12/04/21 1339 12/05/21 0130 12/06/21 0111 12/06/21 1137  WBC 1.8* 1.8* 2.5* 2.9*  NEUTROABS 1.3* 1.3*  --   --   HGB 13.6 11.0* 11.5* 11.9*  HCT 40.5 32.2* 32.1* 33.1*  MCV 85.1 83.6 80.0 79.2*  PLT 41* 24* 20* 19*      Assessment/Plan: Severe transaminitis of unclear source - question meds. Pancytopenic and Acute Renal Failure. LFTs improving. No new GI recs. Supportive care. Will sign off. Call us back if questions.   Shirley Friar 12/06/2021, 1:05 PM  Questions please call [phone] Patient ID: Megan Reid, female   DOB: 1965/12/04, 56 y.o.   MRN: [id number]

## 2021-12-06 NOTE — Progress Notes (Signed)
   NAME:  Megan Reid, MRN:  [id number], DOB:  12/03/1965, LOS: 2 ADMISSION DATE:  12/04/2021, CONSULTATION DATE:  12/04/21 REFERRING MD:  ED, Toniann Fail , CHIEF COMPLAINT:     History of Present Illness:  Megan Reid is a 56 yo woman with hx of sjogrens, recent trigeminal pain on R, pancytopenia, Asthma, lumbar spinal stenosis presenting with acute hepatitis of unclear etiology.  Pertinent  Medical History  Swimmers ear 09/2021 R trigeminal nerve pain, s/p several rounds of antibiotics and steroids. 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  Started Carbamazepine 200mg  daily on 8/4 x 1 week, then increased to 200mg  BID  sjogrens, recent trigeminal pain on R, pancytopenia, Asthma, lumbar spinal stenosis.   Significant Hospital Events: Including procedures, antibiotic start and stop dates in addition to other pertinent events   8/21 admit  Interim History / Subjective:  Breathing a bit worse this am. +RUQ pain and pain with deep inspiration  Objective   Blood pressure 127/72, pulse 83, temperature 98.6 F (37 C), resp. rate (!) 30, weight 75.3 kg, last menstrual period 05/22/2012, SpO2 99 %.        Intake/Output Summary (Last 24 hours) at 12/06/2021 0745 Last data filed at 12/06/2021 12/08/2021 Gross per 24 hour  Intake 4283.39 ml  Output 1315 ml  Net 2968.39 ml    Filed Weights   12/04/21 2000 12/06/21 0500  Weight: 75.3 kg 75.3 kg    Examination: More awake, oriented this AM Abdomen diffusely tender but more so in RUQ Heart sounds loud S2, ext warm Moves all 4 ext to command Aox3  LFTs improved INR, bicarb worse  Resolved Hospital Problem list     Assessment & Plan:  Septic type response with fevers, tachycardia, AMS Acute liver failure (transaminitis, elevated INR, acute on chronic thrombocytopenia)- in setting of carbamazepine initiation within past month.  No s/s of DRESS.  Does take some tylenol at home but does not seem to be enough to  explain current presentation.  Patent portal flow, hepatitis panel neg.  Echo reassuring.  Tick-borne illness panel pending.  LFTs fortunately are decreasing.  Fever curve improved. Acute renal failure, AGMA- on bicarb drip, making small amount of urine.  Renal 12/06/21 okay. 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  - Finish NAC per non-APAP protocol - Trend LFTs, INR, latter will likely lag in improvement - f/u tick-borne and rheum labs - Vit K for INR > 2 per poison control - Bicarb push and drip, avoid nephrotoxins, no role for HD at present - Trial of dilaudid for pain to help wit splinting - K repleted - Abx are continuing for sepsis NOS, pct up, f/u culture data - Keep in ICU given ongoing resp distress, multiorgan failure  Best Practice (right click and "Reselect all SmartList Selections" daily)   Diet/type: trial of clear liquids DVT prophylaxis: SCDs given degree of thrombocytopenia GI prophylaxis: PPI Lines: PIVs Foley:  yes, keep for now Code Status:  full code Last date of multidisciplinary goals of care discussion []   40 min cc time independent of procedures 12/08/21 MD PCCM

## 2021-12-06 NOTE — Progress Notes (Signed)
Admit: 12/04/2021 LOS: 2  46F AKI, transaminitis, panctyopenia, recent trigeminal pain started on carbamazepine  Subjective:  UOP 1.3L  Defervesced, BPs stable, on RA Vanc / Cefepime, on NaHCO3 gtt a 71mL/h SCr stable, K 3.0, HCO3 12 AG 21 Feels better   08/21 0701 - 08/22 0700 In: 4283.4 [P.O.:180; I.V.:3555.3; IV Piggyback:548.1] Out: 1315 [Urine:1315]  Filed Weights   12/04/21 2000 12/06/21 0500  Weight: 75.3 kg 75.3 kg    Scheduled Meds:  Chlorhexidine Gluconate Cloth  6 each Topical Daily   fluticasone  2 spray Each Nare Daily   vancomycin variable dose per unstable renal function (pharmacist dosing)   Does not apply See admin instructions   Continuous Infusions:  acetylcysteine 15 mg/kg/hr (12/06/21 0600)   ceFEPime (MAXIPIME) IV Stopped (12/05/21 2259)   phytonadione (VITAMIN K) 10 mg in dextrose 5 % 50 mL IVPB Stopped (12/06/21 0322)   sodium bicarbonate 150 mEq in sterile water 1,150 mL infusion 75 mL/hr at 12/06/21 0600   PRN Meds:.docusate sodium, HYDROmorphone (DILAUDID) injection, menthol-cetylpyridinium, ondansetron (ZOFRAN) IV, mouth rinse, polyethylene glycol  Current Labs: reviewed    Physical Exam:  Blood pressure 123/84, pulse 75, temperature 98.4 F (36.9 C), resp. rate (!) 26, weight 75.3 kg, last menstrual period 05/22/2012, SpO2 93 %. GEN: NAD, conversant, clearly feels unwell ENT: NCAT EYES: EOMI CV: Regular, normal S1 and S2 PULM: CTA B ABD: Tender to palpation, no distention, no rebound or guarding SKIN: No rashes or lesions EXT: No edema  A AKI: Hopefully this is predominantly prerenal/potential ATN UA not consistent with GN Renal ultrasound without acute structural issues Continue supportive care Continue hydration with NaHCO3 No HD indications Inc AG metabolic acidosis, lactate nto high enough to explain; trend probably from renal failure and liver failure Transaminitis: GI following, on acetylcysteine, work-up in process;  LFTs downtrending Pancytopenia, stable today Fevers, unclear cause, cultures pending, on Vanco and cefepime; BCx NGTD Recent trigeminal neuralgia, carbamazepine prescribed  P As above No RRT indications Trend acidosis Medication Issues; Preferred narcotic agents for pain control are hydromorphone, fentanyl, and methadone. Morphine should not be used.  Baclofen should be avoided Avoid oral sodium phosphate and magnesium citrate based laxatives / bowel preps    Sabra Heck MD 12/06/2021, 9:45 AM  Recent Labs  Lab 12/05/21 0252 12/05/21 2232 12/06/21 0111  NA 136 137 140  K 3.9 3.0* 3.0*  CL 105 110 107  CO2 14* 11* 12*  GLUCOSE 123* 137* 153*  BUN 82* 87* 95*  CREATININE 4.28* 4.03* 4.30*  CALCIUM 7.2* 6.6* 7.1*  PHOS  --   --  4.5   Recent Labs  Lab 12/04/21 1339 12/05/21 0130 12/06/21 0111  WBC 1.8* 1.8* 2.5*  NEUTROABS 1.3* 1.3*  --   HGB 13.6 11.0* 11.5*  HCT 40.5 32.2* 32.1*  MCV 85.1 83.6 80.0  PLT 41* 24* 20*

## 2021-12-06 NOTE — Progress Notes (Addendum)
eLink Physician-Brief Progress Note Patient Name: ELGIE LANDINO DOB: Jul 30, 1965 MRN: [id number]   Date of Service  12/06/2021  HPI/Events of Note  Poison control recommended starting vitamin K 10 mg daily for INR goal <2 Advised to continue NAC x 24 hours K 3.0 LFTs slowly improving Serum CO2 11, worsening BUN/Cr slightly improving 87/4.03 Plt 20 - low, unchanged  eICU Interventions  -Start vitamin K 10 mg x 3 days ordered -Trend INR, CMET -Start bicarb gtt -Replete K -Trend plt   12/06/21 @231  AM - Patient c/o nasal congestion. Home Flonase ordered      Jolly Bleicher 12/06/2021, 1:10 AM

## 2021-12-06 NOTE — Care Management (Signed)
  Transition of Care [facility]) Screening Note   Patient Details  Name: Megan Reid Date of Birth: 07-22-1965   Transition of Care [facility]) CM/SW Contact:    Lawerance Sabal, RN Phone Number: 12/06/2021, 12:12 PM    Transition of Care Department [facility]) has reviewed patient and no TOC needs have been identified at this time. We will continue to monitor patient advancement through interdisciplinary progression rounds. If new patient transition needs arise, please place a TOC consult.    Patient is requesting work note at discharge

## 2021-12-06 NOTE — Progress Notes (Signed)
MEDICATION RELATED CONSULT NOTE - FOLLOW UP  Pharmacy Consult for NAC  Indication: possible APAP overdose   Allergies  Allergen Reactions   Sulfa Antibiotics Anaphylaxis, Hives and Swelling    Swelling of the face and tongue     Patient Measurements: Weight: 75.3 kg (166 lb)   Vital Signs: Temp: 99 F (37.2 C) (08/22 0100) Temp Source: Bladder (08/21 2000) BP: 133/94 (08/22 0000) Pulse Rate: 82 (08/22 0100) Intake/Output from previous day: 08/21 0701 - 08/22 0700 In: 3230.2 [P.O.:180; I.V.:2952.8; IV Piggyback:97.4] Out: 695 [Urine:695] Intake/Output from this shift: Total I/O In: 111.1 [I.V.:111.1] Out: 695 [Urine:695]  Labs: Recent Labs    12/04/21 1339 12/04/21 1950 12/05/21 0130 12/05/21 0252 12/05/21 2232  WBC 1.8*  --  1.8*  --   --   HGB 13.6  --  11.0*  --   --   HCT 40.5  --  32.2*  --   --   PLT 41*  --  24*  --   --   APTT  --  48*  --   --   --   CREATININE 3.41*  --   --  4.28* 4.03*  ALBUMIN 2.9*  --   --  2.0* 1.8*  PROT 9.1*  --   --  6.7 6.7  AST 9,740*  --   --  7,050* 6,541*  ALT 2,902*  --   --  2,127* 1,894*  ALKPHOS 84  --   --  65 61  BILITOT 0.9  --   --  1.5* 2.0*  BILIDIR  --   --   --  1.0*  --   IBILI  --   --   --  0.5  --     Estimated Creatinine Clearance: 16.1 mL/min (A) (by C-G formula based on SCr of 4.03 mg/dL (H)).   Assessment: 56 yo female presented on 12/04/21. Patient started on NAC per Poison Control recommendation. Patient reported taking scheduled ibuprofen and acetaminophen for facial pain. Patient reported taking 2 tablets of acetaminophen every 4 hrs. Denies other over the counter medications that include acetaminophen except possible cold medication but unsure name just a couple of doses over past month. Patient recently started on carbamazepine for trigeminal nerve pain but reported never starting this medication due to not picking up but per [facility] patient picked up on 11/18/21. Carbamazepine level 4.3.   RN  called Poison Control 8/22 0100 AM to update on labs - recommendations to continue IV NAC.  AST 6541, ALT 1894, bicarb 11, PT 28.1 INR 2.7, apap <10  Goals of Therapy: a. Minimum of 6 doses of oral NAC or 24 hours of IV NAC have been administered  b. Patient is asymptomatic  c. AST and ALT normal  d. Serum bicarbonate ? 20 or pH ? 7.25  e. INR < 2 (if required)  f. Acetaminophen level ? 10 mcg/mL (if required)   Plan:  Per Poison Control continue NAC 15 mg/kg/hr Recheck CMET, PT/INR in about 22 hours  Christoper Fabian, PharmD, BCPS Please see amion for complete clinical pharmacist phone list 12/06/2021 1:01 AM

## 2021-12-07 DIAGNOSIS — B179 Acute viral hepatitis, unspecified: Secondary | ICD-10-CM | POA: Diagnosis not present

## 2021-12-07 LAB — MAGNESIUM: Magnesium: 2 mg/dL (ref 1.7–2.4)

## 2021-12-07 LAB — CBC
HCT: 31.9 % — ABNORMAL LOW (ref 36.0–46.0)
Hemoglobin: 11.1 g/dL — ABNORMAL LOW (ref 12.0–15.0)
MCH: 28.2 pg (ref 26.0–34.0)
MCHC: 34.8 g/dL (ref 30.0–36.0)
MCV: 81.2 fL (ref 80.0–100.0)
Platelets: 19 10*3/uL — CL (ref 150–400)
RBC: 3.93 MIL/uL (ref 3.87–5.11)
RDW: 13.8 % (ref 11.5–15.5)
WBC: 2.5 10*3/uL — ABNORMAL LOW (ref 4.0–10.5)
nRBC: 0 % (ref 0.0–0.2)

## 2021-12-07 LAB — PROTIME-INR
INR: 1.3 — ABNORMAL HIGH (ref 0.8–1.2)
Prothrombin Time: 16.4 seconds — ABNORMAL HIGH (ref 11.4–15.2)

## 2021-12-07 LAB — VANCOMYCIN, RANDOM: Vancomycin Rm: 14 ug/mL

## 2021-12-07 LAB — EHRLICHIA ANTIBODY PANEL
E chaffeensis (HGE) Ab, IgG: NEGATIVE
E chaffeensis (HGE) Ab, IgM: NEGATIVE
E. Chaffeensis (HME) IgM Titer: NEGATIVE
E.Chaffeensis (HME) IgG: NEGATIVE

## 2021-12-07 LAB — PHOSPHORUS: Phosphorus: 2.3 mg/dL — ABNORMAL LOW (ref 2.5–4.6)

## 2021-12-07 LAB — AMMONIA: Ammonia: 53 umol/L — ABNORMAL HIGH (ref 9–35)

## 2021-12-07 LAB — PROCALCITONIN: Procalcitonin: 12.88 ng/mL

## 2021-12-07 MED ORDER — POTASSIUM PHOSPHATES 15 MMOLE/5ML IV SOLN
15.0000 mmol | Freq: Once | INTRAVENOUS | Status: AC
  Administered 2021-12-07: 15 mmol via INTRAVENOUS
  Filled 2021-12-07: qty 5

## 2021-12-07 MED ORDER — MONTELUKAST SODIUM 10 MG PO TABS
10.0000 mg | ORAL_TABLET | Freq: Every day | ORAL | Status: DC
Start: 2021-12-07 — End: 2021-12-12
  Administered 2021-12-07 – 2021-12-11 (×5): 10 mg via ORAL
  Filled 2021-12-07 (×5): qty 1

## 2021-12-07 MED ORDER — HYDROMORPHONE HCL 1 MG/ML IJ SOLN
0.2500 mg | INTRAMUSCULAR | Status: DC | PRN
  Administered 2021-12-11: 0.25 mg via INTRAVENOUS
  Filled 2021-12-07: qty 0.5

## 2021-12-07 MED ORDER — PANTOPRAZOLE SODIUM 40 MG PO TBEC
40.0000 mg | DELAYED_RELEASE_TABLET | Freq: Every day | ORAL | Status: DC
  Administered 2021-12-07 – 2021-12-12 (×6): 40 mg via ORAL
  Filled 2021-12-07 (×6): qty 1

## 2021-12-07 MED ORDER — POTASSIUM CHLORIDE 10 MEQ/100ML IV SOLN
10.0000 meq | INTRAVENOUS | Status: AC
  Administered 2021-12-07 (×2): 10 meq via INTRAVENOUS
  Filled 2021-12-07 (×2): qty 100

## 2021-12-07 MED ORDER — POTASSIUM CHLORIDE 10 MEQ/100ML IV SOLN
10.0000 meq | INTRAVENOUS | Status: AC
  Administered 2021-12-07 (×3): 10 meq via INTRAVENOUS
  Filled 2021-12-07 (×2): qty 100

## 2021-12-07 NOTE — Progress Notes (Signed)
   NAME:  Megan Reid, MRN:  [id number], DOB:  [birth year], LOS: 3 ADMISSION DATE:  12/04/2021, CONSULTATION DATE:  12/04/21 REFERRING MD:  ED, Toniann Fail , CHIEF COMPLAINT:     History of Present Illness:  Megan Reid is a 56 yo woman with hx of sjogrens, recent trigeminal pain on R, pancytopenia, Asthma, lumbar spinal stenosis presenting with acute hepatitis of unclear etiology.  Pertinent  Medical History  Swimmers ear 09/2021 R trigeminal nerve pain, s/p several rounds of antibiotics and steroids. 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  Started Carbamazepine 200mg  daily on 8/4 x 1 week, then increased to 200mg  BID  sjogrens, recent trigeminal pain on R, pancytopenia, Asthma, lumbar spinal stenosis.   Significant Hospital Events: Including procedures, antibiotic start and stop dates in addition to other pertinent events   8/21 admit  Interim History / Subjective:  Improved this AM. Is a bit sleepy after dilaudid last night.  Objective   Blood pressure 97/64, pulse 91, temperature 98.3 F (36.8 C), temperature source Oral, resp. rate (!) 23, weight 75.3 kg, last menstrual period 05/22/2012, SpO2 99 %.        Intake/Output Summary (Last 24 hours) at 12/07/2021 0809 Last data filed at 12/07/2021 0600 Gross per 24 hour  Intake 3371.24 ml  Output 3500 ml  Net -128.76 ml    Filed Weights   12/04/21 2000 12/06/21 0500 12/07/21 0500  Weight: 75.3 kg 75.3 kg 75.3 kg    Examination: Sleepy this AM Denies pain Abdomen less tender Ext warm Moves all 4 ext to command  LFTs improved INR improved Bicarb improved  Resolved Hospital Problem list     Assessment & Plan:  Septic type response with fevers, tachycardia, AMS Acute liver failure (transaminitis, elevated INR, acute on chronic thrombocytopenia)- in setting of carbamazepine initiation within past month.  No s/s of DRESS.  Does take some tylenol at home but does not seem to be enough to explain  current presentation.  Patent portal flow, hepatitis panel neg.  Echo reassuring.  Tick-borne illness panel neg.  LFTs fortunately are decreasing.  Fever curve improved. Acute renal failure, AGMA- on bicarb drip, making small amount of urine.  Renal 12/08/21 okay. 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  - Finish NAC - Trend LFTs, INR - DC vanc, continue cefepime x 7 days for sepsis NOS (fever, elevated Pct) - Vit K for INR > 2 per poison control - Continue bicarb drip for now, can DC once HCO3 > 20 - Reduce dilaudid dose - Continue to replete K - PT/OT consult - Okay to transfer to floor, remaining issue is ability to take PO, K normalization, continue liver/kidney/bone marrow function improvement.  Appreciate TRH taking over starting 12/08/21 am - Daughter and husband updated at bedside  US MD PCCM

## 2021-12-07 NOTE — Evaluation (Signed)
Occupational Therapy Evaluation Patient Details Name: Megan Reid MRN: [id number] DOB: 1965/11/04 Today's Date: 12/07/2021   History of Present Illness 56 y/o female presented to ED on 12/04/21 for lethargy, weakness, and poor PO intake. Found to severe transaminitis and new renal failure. PMH: Sjogren's syndrome, recent trigeminal neuralgia, asthma   Clinical Impression   Pt admitted for concerns listed above. PTA pt reported that she was independent with all ADL's and IADL's, including working as a Psychologist, forensic. At this time, pt is requiring increased assist for all ADL's, up to mod A +2 due to balance, weakness, and cognitive concerns. Overall she is needing min-mod A +2 for functional mobility as well. Recommending AIR to maximize independence with all ADL's and safety. OT will follow acutely.       Recommendations for follow up therapy are one component of a multi-disciplinary discharge planning process, led by the attending physician.  Recommendations may be updated based on patient status, additional functional criteria and insurance authorization.   Follow Up Recommendations  Acute inpatient rehab (3hours/day)    Assistance Recommended at Discharge Frequent or constant Supervision/Assistance  Patient can return home with the following A lot of help with walking and/or transfers;A lot of help with bathing/dressing/bathroom;Two people to help with bathing/dressing/bathroom;Assistance with cooking/housework;Direct supervision/assist for medications management;Help with stairs or ramp for entrance    Functional Status Assessment  Patient has had a recent decline in their functional status and demonstrates the ability to make significant improvements in function in a reasonable and predictable amount of time.  Equipment Recommendations  BSC/3in1;Tub/shower seat    Recommendations for Other Services Rehab consult     Precautions / Restrictions Precautions Precautions:  Fall Precaution Comments: foley Restrictions Weight Bearing Restrictions: No      Mobility Bed Mobility Overal bed mobility: Needs Assistance Bed Mobility: Supine to Sit, Sit to Supine     Supine to sit: Mod assist, +2 for physical assistance, +2 for safety/equipment Sit to supine: Mod assist, +2 for physical assistance, +2 for safety/equipment   General bed mobility comments: assist for LE management and trunk elevation    Transfers Overall transfer level: Needs assistance Equipment used: 2 person hand held assist Transfers: Sit to/from Stand Sit to Stand: Min assist, +2 safety/equipment, +2 physical assistance           General transfer comment: assist to stand with use of momentum and steady      Balance Overall balance assessment: Needs assistance Sitting-balance support: No upper extremity supported, Feet supported Sitting balance-Leahy Scale: Fair Sitting balance - Comments: initiially minA to maintain but progressed to supervision   Standing balance support: Bilateral upper extremity supported Standing balance-Leahy Scale: Poor Standing balance comment: reliant on UE support and external support                           ADL either performed or assessed with clinical judgement   ADL Overall ADL's : Needs assistance/impaired Eating/Feeding: Set up;Sitting   Grooming: Set up;Sitting   Upper Body Bathing: Minimal assistance;Sitting   Lower Body Bathing: Moderate assistance;+2 for physical assistance;+2 for safety/equipment;Sitting/lateral leans;Sit to/from stand   Upper Body Dressing : Minimal assistance;Sitting   Lower Body Dressing: Moderate assistance;+2 for physical assistance;+2 for safety/equipment;Sitting/lateral leans;Sit to/from stand   Toilet Transfer: Minimal assistance;+2 for physical assistance;+2 for safety/equipment;Stand-pivot   Toileting- Clothing Manipulation and Hygiene: Moderate assistance;+2 for physical assistance;+2 for  safety/equipment;Sit to/from stand;Sitting/lateral lean  Functional mobility during ADLs: Minimal assistance;+2 for physical assistance;+2 for safety/equipment General ADL Comments: Pt requiring increased assist for all ADL's due to weakness, balance deficits, and cognitive concerns     Vision Baseline Vision/History: 0 No visual deficits Ability to See in Adequate Light: 0 Adequate Patient Visual Report: No change from baseline Vision Assessment?: No apparent visual deficits     Perception     Praxis      Pertinent Vitals/Pain Pain Assessment Pain Assessment: No/denies pain Faces Pain Scale: No hurt Pain Intervention(s): Monitored during session     Hand Dominance Right   Extremity/Trunk Assessment Upper Extremity Assessment Upper Extremity Assessment: Generalized weakness;RUE deficits/detail RUE Deficits / Details: Pt keeping arm tight to her side with elbow flexed, unsure what is tone verses resistance RUE: Shoulder pain with ROM RUE Coordination: decreased fine motor;decreased gross motor   Lower Extremity Assessment Lower Extremity Assessment: Defer to PT evaluation   Cervical / Trunk Assessment Cervical / Trunk Assessment: Normal   Communication Communication Communication: No difficulties (soft spoken and limited verbalizations at times)   Cognition Arousal/Alertness: Awake/alert Behavior During Therapy: Flat affect Overall Cognitive Status: Impaired/Different from baseline Area of Impairment: Attention, Problem solving, Safety/judgement                   Current Attention Level: Sustained     Safety/Judgement: Decreased awareness of deficits   Problem Solving: Slow processing, Difficulty sequencing, Requires verbal cues General Comments: very slow processing and increased time to follow commands     General Comments  VSS on RA    Exercises     Shoulder Instructions      Home Living Family/patient expects to be discharged to::  Private residence Living Arrangements: Spouse/significant other Available Help at Discharge: Family;Available PRN/intermittently Type of Home: House Home Access: Stairs to enter [employer] of Steps: 5 Entrance Stairs-Rails: Right;Left Home Layout: One level     Bathroom Shower/Tub: Chief Strategy Officer: Standard     Home Equipment: None          Prior Functioning/Environment Prior Level of Function : Independent/Modified Independent;Working/employed             Mobility Comments: works as Education officer, environmental Problem List: Decreased strength;Decreased activity tolerance;Impaired balance (sitting and/or standing);Decreased safety awareness;Decreased cognition;Impaired sensation;Impaired UE functional use      OT Treatment/Interventions: Self-care/ADL training;Therapeutic exercise;Energy conservation;DME and/or AE instruction;Therapeutic activities;Patient/family education;Balance training    OT Goals(Current goals can be found in the care plan section) Acute Rehab OT Goals Patient Stated Goal: To get stronger OT Goal Formulation: With patient Time For Goal Achievement: 12/21/21 Potential to Achieve Goals: Good ADL Goals Pt Will Perform Grooming: with modified independence;standing Pt Will Perform Lower Body Bathing: with modified independence;sitting/lateral leans;sit to/from stand Pt Will Perform Lower Body Dressing: with modified independence;sitting/lateral leans;sit to/from stand Pt Will Transfer to Toilet: with modified independence;ambulating Pt Will Perform Toileting - Clothing Manipulation and hygiene: with modified independence;sitting/lateral leans;sit to/from stand  OT Frequency: Min 2X/week    Co-evaluation              AM-PAC OT "6 Clicks" Daily Activity     Outcome Measure Help from another person eating meals?: A Little Help from another person taking care of personal grooming?: A Little Help from another  person toileting, which includes using toliet, bedpan, or urinal?: A Lot Help from another person bathing (including washing, rinsing, drying)?: A  Lot Help from another person to put on and taking off regular upper body clothing?: A Little Help from another person to put on and taking off regular lower body clothing?: A Lot 6 Click Score: 15   End of Session Equipment Utilized During Treatment: Gait belt Nurse Communication: Mobility status  Activity Tolerance: Patient tolerated treatment well Patient left: in bed;Other (comment) (transferring to 5W)  OT Visit Diagnosis: Unsteadiness on feet (R26.81);Other abnormalities of gait and mobility (R26.89);Muscle weakness (generalized) (M62.81)                Time: 9458-5929 OT Time Calculation (min): 25 min Charges:  OT General Charges $OT Visit: 1 Visit OT Evaluation $OT Eval Moderate Complexity: 1 Mod  Dino Borntreger, OTR/L  Javarian Jakubiak Elane Bing Plume 12/07/2021, 6:15 PM

## 2021-12-07 NOTE — Progress Notes (Signed)
eLink Physician-Brief Progress Note Patient Name: Megan Reid DOB: 08-02-1965 MRN: [id number]   Date of Service  12/07/2021  HPI/Events of Note  K+ 3.1, Cr 3.97  eICU Interventions  KCL 10 meq iv Q 1 hour x 2.        Thomasene Lot Arvilla Salada 12/07/2021, 12:48 AM

## 2021-12-07 NOTE — Plan of Care (Signed)

## 2021-12-07 NOTE — Progress Notes (Signed)
eLink Physician-Brief Progress Note Patient Name: Megan Reid DOB: [birth year] MRN: [id number]   Date of Service  12/07/2021  HPI/Events of Note  K+ 3.0, Cr 3.97.  eICU Interventions  KCL  10 meq iv Q 1 hour x 3        Jayzon Taras U Novie Maggio 12/07/2021, 5:25 AM

## 2021-12-07 NOTE — Progress Notes (Addendum)
Admit: 12/04/2021 LOS: 3  42F AKI, transaminitis, panctyopenia, recent trigeminal pain started on carbamazepine  Subjective:  UOP 3.7L SCr down a tad; K 3.0 and HCO3 20 BPs stable Cefepime, on NaHCO3 gtt a 52mL/h Husband and dtr at bedside   08/22 0701 - 08/23 0700 In: 3483.2 [I.V.:2562.8; IV Piggyback:920.4] Out: 3750 [Urine:3750]  Filed Weights   12/04/21 2000 12/06/21 0500 12/07/21 0500  Weight: 75.3 kg 75.3 kg 75.3 kg    Scheduled Meds:  Chlorhexidine Gluconate Cloth  6 each Topical Daily   fluticasone  2 spray Each Nare Daily   Continuous Infusions:  acetylcysteine 15 mg/kg/hr (12/07/21 0600)   ceFEPime (MAXIPIME) IV Stopped (12/06/21 2248)   phytonadione (VITAMIN K) 10 mg in dextrose 5 % 50 mL IVPB Stopped (12/06/21 0322)   potassium chloride 10 mEq (12/07/21 0928)   sodium bicarbonate 150 mEq in sterile water 1,150 mL infusion 75 mL/hr at 12/07/21 0600   PRN Meds:.docusate sodium, HYDROmorphone (DILAUDID) injection, menthol-cetylpyridinium, ondansetron (ZOFRAN) IV, mouth rinse, polyethylene glycol  Current Labs: reviewed    Physical Exam:  Blood pressure 97/64, pulse 91, temperature 99.6 F (37.6 C), temperature source Axillary, resp. rate (!) 23, weight 75.3 kg, last menstrual period 05/22/2012, SpO2 99 %. GEN: NAD, conversant, clearly feels unwell ENT: NCAT EYES: EOMI CV: Regular, normal S1 and S2 PULM: CTA B ABD: Tender to palpation, no distention, no rebound or guarding SKIN: No rashes or lesions EXT: No edema  A AKI: Suspect hypovolemia and acute illness, now ATN UA not consistent with GN Renal ultrasound without acute structural issues Continue supportive care Continue hydration with NaHCO3  until taking stable PO No HD indications UOP inc suggests recovering GFR Inc AG metabolic acidosis, lactate nto high enough to explain; trend probably from renal failure and liver failure Transaminitis: GI following, on acetylcysteine, work-up in  process; LFTs downtrending Pancytopenia, stable today Fevers, unclear cause, BCx NGTD, on cefepime;  Recent trigeminal neuralgia, carbamazepine prescribed  P As above No RRT indications Trend acidosis Medication Issues; Preferred narcotic agents for pain control are hydromorphone, fentanyl, and methadone. Morphine should not be used.  Baclofen should be avoided Avoid oral sodium phosphate and magnesium citrate based laxatives / bowel preps    Sabra Heck MD 12/07/2021, 9:42 AM  Recent Labs  Lab 12/06/21 0111 12/06/21 1137 12/06/21 2111 12/07/21 0131  NA 140 143 141 143  K 3.0* 2.9* 3.1* 3.0*  CL 107 103 100 101  CO2 12* 18* 22 20*  GLUCOSE 153* 168* 114* 102*  BUN 95* 93* 92* 90*  CREATININE 4.30* 4.19* 3.97* 3.90*  CALCIUM 7.1* 7.1* 7.2* 7.3*  PHOS 4.5  --   --  2.3*    Recent Labs  Lab 12/04/21 1339 12/05/21 0130 12/06/21 0111 12/06/21 1137 12/07/21 0131  WBC 1.8* 1.8* 2.5* 2.9* 2.5*  NEUTROABS 1.3* 1.3*  --  2.3  --   HGB 13.6 11.0* 11.5* 11.9* 11.1*  HCT 40.5 32.2* 32.1* 33.1* 31.9*  MCV 85.1 83.6 80.0 79.2* 81.2  PLT 41* 24* 20* 19* 19*

## 2021-12-07 NOTE — Plan of Care (Signed)
  Problem: Education: Goal: Knowledge of General Education information will improve Description: Including pain rating scale, medication(s)/side effects and non-pharmacologic comfort measures 12/07/2021 2320 by Cecille Amsterdam, RN Outcome: Progressing 12/07/2021 2009 by Cecille Amsterdam, RN Outcome: Progressing   Problem: Health Behavior/Discharge Planning: Goal: Ability to manage health-related needs will improve 12/07/2021 2320 by Cecille Amsterdam, RN Outcome: Progressing 12/07/2021 2009 by Cecille Amsterdam, RN Outcome: Progressing   Problem: Clinical Measurements: Goal: Ability to maintain clinical measurements within normal limits will improve 12/07/2021 2320 by Cecille Amsterdam, RN Outcome: Progressing 12/07/2021 2009 by Cecille Amsterdam, RN Outcome: Progressing

## 2021-12-07 NOTE — Evaluation (Signed)
Physical Therapy Evaluation Patient Details Name: Megan Reid MRN: [id number] DOB: 1965-12-29 Today's Date: 12/07/2021  History of Present Illness  56 y/o female presented to ED on 12/04/21 for lethargy, weakness, and poor PO intake. Found to severe transaminitis and new renal failure. PMH: Sjogren's syndrome, recent trigeminal neuralgia, asthma  Clinical Impression  Patient admitted with the above. PTA, patient lives with husband and independently working as Psychologist, forensic. Patient currently presents with weakness, impaired balance, decreased activity tolerance, and impaired cognition. Patient required modA+2 for bed mobility and minA+2 for sit to stand from bed with HHAx2. Difficulty taking steps this session. Patient will benefit from skilled PT services during acute stay to address listed deficits. Recommend AIR at discharge to maximize functional independence and safety.        Recommendations for follow up therapy are one component of a multi-disciplinary discharge planning process, led by the attending physician.  Recommendations may be updated based on patient status, additional functional criteria and insurance authorization.  Follow Up Recommendations Acute inpatient rehab (3hours/day)      Assistance Recommended at Discharge Frequent or constant Supervision/Assistance  Patient can return home with the following  Two people to help with walking and/or transfers;A lot of help with bathing/dressing/bathroom;Assistance with cooking/housework;Direct supervision/assist for medications management;Direct supervision/assist for financial management;Assist for transportation;Help with stairs or ramp for entrance    Equipment Recommendations Hospital bed;Wheelchair cushion (measurements PT);Wheelchair (measurements PT);Rolling Tyyne Cliett (2 wheels);BSC/3in1  Recommendations for Other Services  Rehab consult    Functional Status Assessment Patient has had a recent decline in their  functional status and demonstrates the ability to make significant improvements in function in a reasonable and predictable amount of time.     Precautions / Restrictions Precautions Precautions: Fall Precaution Comments: foley Restrictions Weight Bearing Restrictions: No      Mobility  Bed Mobility Overal bed mobility: Needs Assistance Bed Mobility: Supine to Sit, Sit to Supine     Supine to sit: Mod assist, +2 for physical assistance, +2 for safety/equipment Sit to supine: Mod assist, +2 for physical assistance, +2 for safety/equipment   General bed mobility comments: assist for LE management and trunk elevation    Transfers Overall transfer level: Needs assistance Equipment used: 2 person hand held assist Transfers: Sit to/from Stand Sit to Stand: Min assist, +2 safety/equipment, +2 physical assistance           General transfer comment: assist to stand with use of momentum and steady    Ambulation/Gait                  Stairs            Wheelchair Mobility    Modified Rankin (Stroke Patients Only)       Balance Overall balance assessment: Needs assistance Sitting-balance support: No upper extremity supported, Feet supported Sitting balance-Leahy Scale: Fair Sitting balance - Comments: initiially minA to maintain but progressed to supervision   Standing balance support: Bilateral upper extremity supported Standing balance-Leahy Scale: Poor Standing balance comment: reliant on UE support and external support                             Pertinent Vitals/Pain Pain Assessment Pain Assessment: Faces Faces Pain Scale: No hurt Pain Intervention(s): Monitored during session    Home Living Family/patient expects to be discharged to:: Private residence Living Arrangements: Spouse/significant other Available Help at Discharge: Family;Available PRN/intermittently Type of Home: House Home Access:  Stairs to enter Entrance  Stairs-Rails: Doctor, general practice of Steps: 5   Home Layout: One level Home Equipment: None      Prior Function Prior Level of Function : Independent/Modified Independent;Working/employed             Mobility Comments: works as Copy        Extremity/Trunk Assessment   Upper Extremity Assessment Upper Extremity Assessment: Defer to OT evaluation    Lower Extremity Assessment Lower Extremity Assessment: Generalized weakness    Cervical / Trunk Assessment Cervical / Trunk Assessment: Normal  Communication   Communication: No difficulties (soft spoken and limited verbalizations at times)  Cognition Arousal/Alertness: Awake/alert Behavior During Therapy: Flat affect Overall Cognitive Status: Impaired/Different from baseline Area of Impairment: Attention, Problem solving, Safety/judgement                   Current Attention Level: Sustained     Safety/Judgement: Decreased awareness of deficits   Problem Solving: Slow processing, Difficulty sequencing, Requires verbal cues General Comments: very slow processing and increased time to follow commands        General Comments      Exercises     Assessment/Plan    PT Assessment Patient needs continued PT services  PT Problem List Decreased strength;Decreased activity tolerance;Decreased balance;Decreased mobility;Decreased coordination;Decreased cognition;Decreased knowledge of use of DME;Decreased safety awareness;Decreased knowledge of precautions       PT Treatment Interventions Gait training;DME instruction;Stair training;Functional mobility training;Therapeutic activities;Therapeutic exercise;Balance training;Patient/family education    PT Goals (Current goals can be found in the Care Plan section)  Acute Rehab PT Goals Patient Stated Goal: did not state PT Goal Formulation: With patient/family Time For Goal Achievement: 12/21/21 Potential to  Achieve Goals: Good    Frequency Min 3X/week     Co-evaluation               AM-PAC PT "6 Clicks" Mobility  Outcome Measure Help needed turning from your back to your side while in a flat bed without using bedrails?: A Lot Help needed moving from lying on your back to sitting on the side of a flat bed without using bedrails?: Total Help needed moving to and from a bed to a chair (including a wheelchair)?: Total Help needed standing up from a chair using your arms (e.g., wheelchair or bedside chair)?: Total Help needed to walk in hospital room?: Total Help needed climbing 3-5 steps with a railing? : Total 6 Click Score: 7    End of Session Equipment Utilized During Treatment: Gait belt Activity Tolerance: Patient tolerated treatment well Patient left: in bed;with call bell/phone within reach (with RN and NT to transport to 5W) Nurse Communication: Mobility status PT Visit Diagnosis: Unsteadiness on feet (R26.81);Other abnormalities of gait and mobility (R26.89);Muscle weakness (generalized) (M62.81)    Time: 2800-3491 PT Time Calculation (min) (ACUTE ONLY): 25 min   Charges:   PT Evaluation $PT Eval Moderate Complexity: 1 Mod          Kasheena Sambrano A. Dan Humphreys PT, DPT Acute Rehabilitation Services Office [phone]   Viviann Spare 12/07/2021, 5:44 PM

## 2021-12-07 NOTE — Progress Notes (Signed)
MEDICATION RELATED CONSULT NOTE - FOLLOW UP  Pharmacy Consult for NAC  Indication: possible APAP overdose   Allergies  Allergen Reactions   Sulfa Antibiotics Anaphylaxis, Hives and Swelling    Swelling of the face and tongue     Patient Measurements: Weight: 75.3 kg (166 lb 0.1 oz)   Vital Signs: Temp: 101.5 F (38.6 C) (08/22 2330) Temp Source: Bladder (08/23 0305) BP: 119/79 (08/23 0000) Pulse Rate: 95 (08/22 2300) Intake/Output from previous day: 08/22 0701 - 08/23 0700 In: 2830.4 [I.V.:2120.2; IV Piggyback:710.3] Out: 3150 [Urine:3150] Intake/Output from this shift: Total I/O In: 1244.9 [I.V.:899.8; IV Piggyback:345.1] Out: 1200 [Urine:1200]  Labs: Recent Labs    12/04/21 1950 12/05/21 0130 12/05/21 0252 12/05/21 2232 12/06/21 0111 12/06/21 1137 12/06/21 2111 12/07/21 0131  WBC  --    < >  --   --  2.5* 2.9*  --  2.5*  HGB  --    < >  --   --  11.5* 11.9*  --  11.1*  HCT  --    < >  --   --  32.1* 33.1*  --  31.9*  PLT  --    < >  --   --  20* 19*  --  19*  APTT 48*  --   --   --   --   --   --   --   CREATININE  --   --  4.28* 4.03* 4.30* 4.19* 3.97* 3.90*  MG  --   --   --   --  2.3  --   --  2.0  PHOS  --   --   --   --  4.5  --   --  2.3*  ALBUMIN  --   --  2.0* 1.8* 1.9*  --   --  1.9*  PROT  --   --  6.7 6.7 6.9  --   --  6.9  AST  --   --  7,050* 6,578* 4,696*  --   --  2,866*  ALT  --   --  2,127* 1,894* 2,031*  --   --  1,607*  ALKPHOS  --   --  65 61 63  --   --  83  BILITOT  --   --  1.5* 2.0* 2.6*  --   --  4.0*  BILIDIR  --   --  1.0*  --   --   --   --   --   IBILI  --   --  0.5  --   --   --   --   --    < > = values in this interval not displayed.    Estimated Creatinine Clearance: 16.6 mL/min (A) (by C-G formula based on SCr of 3.9 mg/dL (H)).   Assessment: 56 yo female presented on 12/04/21. Patient started on NAC per Poison Control recommendation. Patient reported taking scheduled ibuprofen and acetaminophen for facial pain. Patient  reported taking 2 tablets of acetaminophen every 4 hrs. Denies other over the counter medications that include acetaminophen except possible cold medication but unsure name just a couple of doses over past month. Patient recently started on carbamazepine for trigeminal nerve pain but reported never starting this medication due to not picking up but per [facility] patient picked up on 11/18/21. Carbamazepine level 4.3.   RN called Poison Control 8/23 0200 AM to update on new labs - recommendations to continue IV NAC x 24 more  hours.  AST 2866, ALT 1607, bicarb 20, PT 16.4, INR 1.3  Goals of Therapy: a. Minimum of 6 doses of oral NAC or 24 hours of IV NAC have been administered  b. Patient is asymptomatic  c. AST and ALT normal  d. Serum bicarbonate ? 20 or pH ? 7.25  e. INR < 2 (if required)  f. Acetaminophen level ? 10 mcg/mL (if required)   Plan:  Per Poison Control continue NAC 15 mg/kg/hr x 24 more hours Recheck CMET, PT/INR in about 22 hours  Christoper Fabian, PharmD, BCPS Please see amion for complete clinical pharmacist phone list 12/07/2021 4:35 AM

## 2021-12-08 DIAGNOSIS — M3501 Sicca syndrome with keratoconjunctivitis: Secondary | ICD-10-CM

## 2021-12-08 DIAGNOSIS — N179 Acute kidney failure, unspecified: Secondary | ICD-10-CM | POA: Diagnosis not present

## 2021-12-08 DIAGNOSIS — D696 Thrombocytopenia, unspecified: Secondary | ICD-10-CM | POA: Diagnosis not present

## 2021-12-08 DIAGNOSIS — B179 Acute viral hepatitis, unspecified: Secondary | ICD-10-CM | POA: Diagnosis not present

## 2021-12-08 LAB — CBC
HCT: 27.5 % — ABNORMAL LOW (ref 36.0–46.0)
HCT: 28.5 % — ABNORMAL LOW (ref 36.0–46.0)
Hemoglobin: 9.9 g/dL — ABNORMAL LOW (ref 12.0–15.0)
Hemoglobin: 9.8 g/dL — ABNORMAL LOW (ref 12.0–15.0)
MCH: 28.4 pg (ref 26.0–34.0)
MCH: 28.5 pg (ref 26.0–34.0)
MCHC: 36 g/dL (ref 30.0–36.0)
MCHC: 34.4 g/dL (ref 30.0–36.0)
MCV: 78.8 fL — ABNORMAL LOW (ref 80.0–100.0)
MCV: 82.8 fL (ref 80.0–100.0)
Platelets: 11 10*3/uL — CL (ref 150–400)
Platelets: 12 10*3/uL — CL (ref 150–400)
RBC: 3.49 MIL/uL — ABNORMAL LOW (ref 3.87–5.11)
RBC: 3.44 MIL/uL — ABNORMAL LOW (ref 3.87–5.11)
RDW: 13.2 % (ref 11.5–15.5)
RDW: 13.4 % (ref 11.5–15.5)
WBC: 2.3 10*3/uL — ABNORMAL LOW (ref 4.0–10.5)
WBC: 2.4 10*3/uL — ABNORMAL LOW (ref 4.0–10.5)
nRBC: 0 % (ref 0.0–0.2)
nRBC: 0 % (ref 0.0–0.2)

## 2021-12-08 LAB — COMPREHENSIVE METABOLIC PANEL
ALT: 1018 U/L — ABNORMAL HIGH (ref 0–44)
ALT: 1328 U/L — ABNORMAL HIGH (ref 0–44)
ALT: 1607 U/L — ABNORMAL HIGH (ref 0–44)
AST: 1805 U/L — ABNORMAL HIGH (ref 15–41)
AST: 2611 U/L — ABNORMAL HIGH (ref 15–41)
AST: 2866 U/L — ABNORMAL HIGH (ref 15–41)
Albumin: 1.6 g/dL — ABNORMAL LOW (ref 3.5–5.0)
Albumin: 1.8 g/dL — ABNORMAL LOW (ref 3.5–5.0)
Albumin: 1.9 g/dL — ABNORMAL LOW (ref 3.5–5.0)
Alkaline Phosphatase: 101 U/L (ref 38–126)
Alkaline Phosphatase: 101 U/L (ref 38–126)
Alkaline Phosphatase: 83 U/L (ref 38–126)
Anion gap: 18 — ABNORMAL HIGH (ref 5–15)
Anion gap: 22 — ABNORMAL HIGH (ref 5–15)
Anion gap: 22 — ABNORMAL HIGH (ref 5–15)
BUN: 80 mg/dL — ABNORMAL HIGH (ref 6–20)
BUN: 88 mg/dL — ABNORMAL HIGH (ref 6–20)
BUN: 90 mg/dL — ABNORMAL HIGH (ref 6–20)
CO2: 25 mmol/L (ref 22–32)
CO2: 20 mmol/L — ABNORMAL LOW (ref 22–32)
CO2: 22 mmol/L (ref 22–32)
Calcium: 6.6 mg/dL — ABNORMAL LOW (ref 8.9–10.3)
Calcium: 7.2 mg/dL — ABNORMAL LOW (ref 8.9–10.3)
Calcium: 7.3 mg/dL — ABNORMAL LOW (ref 8.9–10.3)
Chloride: 95 mmol/L — ABNORMAL LOW (ref 98–111)
Chloride: 101 mmol/L (ref 98–111)
Chloride: 97 mmol/L — ABNORMAL LOW (ref 98–111)
Creatinine, Ser: 3.47 mg/dL — ABNORMAL HIGH (ref 0.44–1.00)
Creatinine, Ser: 3.66 mg/dL — ABNORMAL HIGH (ref 0.44–1.00)
Creatinine, Ser: 3.9 mg/dL — ABNORMAL HIGH (ref 0.44–1.00)
GFR, Estimated: 15 mL/min — ABNORMAL LOW (ref >60)
GFR, Estimated: 13 mL/min — ABNORMAL LOW (ref >60)
GFR, Estimated: 14 mL/min — ABNORMAL LOW (ref >60)
Glucose, Bld: 132 mg/dL — ABNORMAL HIGH (ref 70–99)
Glucose, Bld: 102 mg/dL — ABNORMAL HIGH (ref 70–99)
Glucose, Bld: 96 mg/dL (ref 70–99)
Potassium: 2.3 mmol/L — CL (ref 3.5–5.1)
Potassium: 2.9 mmol/L — ABNORMAL LOW (ref 3.5–5.1)
Potassium: 3 mmol/L — ABNORMAL LOW (ref 3.5–5.1)
Sodium: 138 mmol/L (ref 135–145)
Sodium: 141 mmol/L (ref 135–145)
Sodium: 143 mmol/L (ref 135–145)
Total Bilirubin: 4.9 mg/dL — ABNORMAL HIGH (ref 0.3–1.2)
Total Bilirubin: 4 mg/dL — ABNORMAL HIGH (ref 0.3–1.2)
Total Bilirubin: 5.1 mg/dL — ABNORMAL HIGH (ref 0.3–1.2)
Total Protein: 6 g/dL — ABNORMAL LOW (ref 6.5–8.1)
Total Protein: 6.8 g/dL (ref 6.5–8.1)
Total Protein: 6.9 g/dL (ref 6.5–8.1)

## 2021-12-08 LAB — PHOSPHORUS: Phosphorus: 3 mg/dL (ref 2.5–4.6)

## 2021-12-08 LAB — PROTIME-INR
INR: 1.2 (ref 0.8–1.2)
Prothrombin Time: 15.4 seconds — ABNORMAL HIGH (ref 11.4–15.2)

## 2021-12-08 LAB — MAGNESIUM: Magnesium: 1.6 mg/dL — ABNORMAL LOW (ref 1.7–2.4)

## 2021-12-08 LAB — AMMONIA: Ammonia: 37 umol/L — ABNORMAL HIGH (ref 9–35)

## 2021-12-08 MED ORDER — MAGNESIUM SULFATE 2 GM/50ML IV SOLN
2.0000 g | Freq: Once | INTRAVENOUS | Status: AC
  Administered 2021-12-08: 2 g via INTRAVENOUS
  Filled 2021-12-08: qty 50

## 2021-12-08 MED ORDER — LACTATED RINGERS IV SOLN: INTRAVENOUS | Status: DC

## 2021-12-08 MED ORDER — POTASSIUM CHLORIDE CRYS ER 20 MEQ PO TBCR
40.0000 meq | EXTENDED_RELEASE_TABLET | Freq: Once | ORAL | Status: AC
  Administered 2021-12-08: 40 meq via ORAL
  Filled 2021-12-08: qty 2

## 2021-12-08 MED ORDER — POTASSIUM CHLORIDE CRYS ER 20 MEQ PO TBCR
40.0000 meq | EXTENDED_RELEASE_TABLET | Freq: Every day | ORAL | Status: DC
  Filled 2021-12-08: qty 2

## 2021-12-08 MED ORDER — POTASSIUM CHLORIDE 10 MEQ/100ML IV SOLN
10.0000 meq | INTRAVENOUS | Status: AC
  Administered 2021-12-08 (×6): 10 meq via INTRAVENOUS
  Filled 2021-12-08 (×6): qty 100

## 2021-12-08 MED ORDER — POTASSIUM CHLORIDE CRYS ER 20 MEQ PO TBCR
40.0000 meq | EXTENDED_RELEASE_TABLET | ORAL | Status: DC
Start: 2021-12-08 — End: 2021-12-08
  Administered 2021-12-08: 40 meq via ORAL
  Filled 2021-12-08: qty 2

## 2021-12-08 NOTE — Progress Notes (Signed)
PROGRESS NOTE        PATIENT DETAILS Name: Megan Reid Age: 56 y.o. Sex: female Date of Birth: 11/28/1965 Admit Date: 12/04/2021 Admitting Physician Collier Bullock, MD NIO:[id number], Ronie Spies, MD  Brief Summary: Patient is a 56 y.o.  female with history of Sjogren syndrome-recent right facial pain-started on Tegretol-brought to the ED for lethargy, generalized body pain, nonbloody diarrhea-patient was found to have acute metabolic encephalopathy in the setting of acute hepatitis, AKI, pancytopenia-initially admitted by [facility]-but deteriorated and subsequently transferred to the ICU.  Significant events: 8/20>> admit to [facility]-encephalopathy/AKI/acute hepatitis/fever-?  Side effect of carbamazepine. 8/21>> transferred to ICU. 8/24>> transfer back to [facility].  Significant studies: 8/20>> CXR: No PNA 8/20>> abdominal ultrasound: Fatty liver, no hydronephrosis. 8/20>>Acute hepatitis serology: Negative 8/20>> Tegretol level: 4.3 (normal) 8/21>> Tylenol level:<10 8/21>> antimitochondrial /anti-smooth muscle/anti microsomal antibody: Negative 8/29>> Ehrlichia antibody panel: Negative 8/21>> Lyme Antibody: Negative 8/21>> RMSF titers: Negative 8/21>> CMV titers: Negative 8/21>> EBV PCR: Negative 8/21>> ANA: Positive, dsDNA negative 8/21>> CT chest/abdomen/pelvis: No acute abnormality identified. 8/21>> Echo: EF 60-65%  Significant microbiology data: 8/21>> blood culture: No growth 8/21>> COVID PCR: Negative.  Procedures: None  Consults: GI PCCM Nephrology  Subjective: Lying comfortably in bed-denies any chest pain or shortness of breath.  No epistaxis/oral bleeding/petechial hemorrhage.  Objective: Vitals: Blood pressure 119/74, pulse 88, temperature 99 F (37.2 C), temperature source Oral, resp. rate (!) 25, weight 77.9 kg, last menstrual period 05/22/2012, SpO2 92 %.   Exam: Gen Exam:Alert awake-not in any distress HEENT:atraumatic,  normocephalic Chest: B/L clear to auscultation anteriorly CVS:S1S2 regular Abdomen:soft non tender, non distended Extremities:no edema Neurology: Non focal Skin: no rash  Pertinent Labs/Radiology:    Latest Ref Rng & Units 12/08/2021   12:47 AM 12/07/2021    1:31 AM 12/06/2021   11:37 AM  CBC  WBC 4.0 - 10.5 K/uL 2.3  2.5  2.9   Hemoglobin 12.0 - 15.0 g/dL 9.9  11.1  11.9   Hematocrit 36.0 - 46.0 % 27.5  31.9  33.1   Platelets 150 - 400 K/uL 11  19  19      Lab Results  Component Value Date   NA 138 12/08/2021   K 2.3 (LL) 12/08/2021   CL 95 (L) 12/08/2021   CO2 25 12/08/2021      Assessment/Plan: Acute hepatitis/pancytopenia: Felt to be due to carbamazepine-LFTs downtrending-continues to have persistent pancytopenia with worsening platelet count-we will touch base with hematology.  No evidence of bleeding-May need platelet transfusion if any further drop in hemoglobin or any evidence of bleeding.  Interestingly-patient has had fluctuating neutropenia and is followed by hematology/oncology-bone marrow biopsy in the past did not show any evidence of dysplasia.  Chart reviewed over the phone with her hematologist-Dr. Shadad-recommendations are to transfuse platelets if she bleeds or if platelet count drops to less than 10.  No further recommendations and supportive care at this point.  Repeat CBC later this afternoon.  AKI: Likely hemodynamically mediated in the setting of acute illness/diarrhea/use of ibuprofen.  UA with proteinuria.  Renal function gradually improving-nephrology following.  Will need to repeat UA after acute illness has resolved to see if proteinuria still present.  Acute metabolic encephalopathy: In the setting of hepatitis/AKI-seems to have improved-following commands this morning.  SIRS: Continues to be febrile-Tmax 101 on 8/23-likely noninfectious-all cultures negative-probably due to hepatitis/carbamazepine adverse effect-imaging studies  negative for any source  of infection-all cultures remain negative to date.  On empiric cefepime  History of Sjogren syndrome with trigeminal neuralgia: Supportive care-would not rechallenge with Tegretol in the future.  History of asthma: Not in exacerbation-continue bronchodilators.  BMI: Estimated body mass index is 25.36 kg/m as calculated from the following:   Height as of 11/18/21: 5' 9"  (1.753 m).   Weight as of this encounter: 77.9 kg.   Code status:   Code Status: Full Code   DVT Prophylaxis: Place and maintain sequential compression device Start: 12/06/21 0759 SCDs Start: 12/05/21 0140 SCDs Start: 12/04/21 2125   Family Communication: Daughter at bedside   Disposition Plan: Status is: Inpatient Remains inpatient appropriate because: Improving hepatitis/AKI-persistent pancytopenia-not yet stable for discharge.   Planned Discharge Destination:CIR   Diet: Diet Order             Diet clear liquid Room service appropriate? Yes; Fluid consistency: Thin  Diet effective now                     Antimicrobial agents: Anti-infectives (From admission, onward)    Start     Dose/Rate Route Frequency Ordered Stop   12/05/21 0045  ceFEPIme (MAXIPIME) 2 g in sodium chloride 0.9 % 100 mL IVPB        2 g 200 mL/hr over 30 Minutes Intravenous Daily at 10 pm 12/05/21 0041 12/11/21 2159   12/05/21 0045  vancomycin (VANCOREADY) IVPB 1500 mg/300 mL        1,500 mg 150 mL/hr over 120 Minutes Intravenous  Once 12/05/21 0041 12/05/21 0545   12/05/21 0042  vancomycin variable dose per unstable renal function (pharmacist dosing)  Status:  Discontinued         Does not apply See admin instructions 12/05/21 0042 12/07/21 0800        MEDICATIONS: Scheduled Meds:  Chlorhexidine Gluconate Cloth  6 each Topical Daily   fluticasone  2 spray Each Nare Daily   montelukast  10 mg Oral QHS   pantoprazole  40 mg Oral Daily   Continuous Infusions:  ceFEPime (MAXIPIME) IV 2 g (12/07/21 2147)   phytonadione  (VITAMIN K) 10 mg in dextrose 5 % 50 mL IVPB Stopped (12/07/21 1145)   potassium chloride 10 mEq (12/08/21 0908)   sodium bicarbonate 150 mEq in sterile water 1,150 mL infusion 75 mL/hr at 12/07/21 2234   PRN Meds:.docusate sodium, HYDROmorphone (DILAUDID) injection, menthol-cetylpyridinium, ondansetron (ZOFRAN) IV, mouth rinse, polyethylene glycol   I have personally reviewed following labs and imaging studies  LABORATORY DATA: CBC: Recent Labs  Lab 12/04/21 1339 12/05/21 0130 12/06/21 0111 12/06/21 1137 12/07/21 0131 12/08/21 0047  WBC 1.8* 1.8* 2.5* 2.9* 2.5* 2.3*  NEUTROABS 1.3* 1.3*  --  2.3  --   --   HGB 13.6 11.0* 11.5* 11.9* 11.1* 9.9*  HCT 40.5 32.2* 32.1* 33.1* 31.9* 27.5*  MCV 85.1 83.6 80.0 79.2* 81.2 78.8*  PLT 41* 24* 20* 19* 19* 11*    Basic Metabolic Panel: Recent Labs  Lab 12/06/21 0111 12/06/21 1137 12/06/21 2111 12/07/21 0131 12/07/21 1240 12/08/21 0047  NA 140 143 141 143 141 138  K 3.0* 2.9* 3.1* 3.0* 2.9* 2.3*  CL 107 103 100 101 97* 95*  CO2 12* 18* 22 20* 22 25  GLUCOSE 153* 168* 114* 102* 96 132*  BUN 95* 93* 92* 90* 88* 80*  CREATININE 4.30* 4.19* 3.97* 3.90* 3.66* 3.47*  CALCIUM 7.1* 7.1* 7.2* 7.3* 7.2* 6.6*  MG 2.3  --   --  2.0  --  1.6*  PHOS 4.5  --   --  2.3*  --  3.0    GFR: Estimated Creatinine Clearance: 18.7 mL/min (A) (by C-G formula based on SCr of 3.47 mg/dL (H)).  Liver Function Tests: Recent Labs  Lab 12/05/21 2232 12/06/21 0111 12/07/21 0131 12/07/21 1240 12/08/21 0047  AST 6,541* 5,812* 2,866* 2,611* 1,805*  ALT 1,894* 2,031* 1,607* 1,328* 1,018*  ALKPHOS 61 63 83 101 101  BILITOT 2.0* 2.6* 4.0* 5.1* 4.9*  PROT 6.7 6.9 6.9 6.8 6.0*  ALBUMIN 1.8* 1.9* 1.9* 1.8* 1.6*   Recent Labs  Lab 12/04/21 1339  LIPASE 209*   Recent Labs  Lab 12/04/21 1950 12/07/21 1240 12/08/21 0047  AMMONIA 36* 53* 37*    Coagulation Profile: Recent Labs  Lab 12/04/21 1950 12/05/21 2232 12/07/21 0131 12/08/21 0047   INR 1.4* 2.7* 1.3* 1.2    Cardiac Enzymes: Recent Labs  Lab 12/04/21 1931 12/05/21 1245 12/06/21 0111  CKTOTAL 1,110* 926* 855*    BNP (last 3 results) No results for input(s): "PROBNP" in the last 8760 hours.  Lipid Profile: No results for input(s): "CHOL", "HDL", "LDLCALC", "TRIG", "CHOLHDL", "LDLDIRECT" in the last 72 hours.  Thyroid Function Tests: No results for input(s): "TSH", "T4TOTAL", "FREET4", "T3FREE", "THYROIDAB" in the last 72 hours.  Anemia Panel: Recent Labs    12/05/21 1245  FERRITIN 722*    Urine analysis:    Component Value Date/Time   COLORURINE YELLOW 12/05/2021 0142   APPEARANCEUR HAZY (A) 12/05/2021 0142   LABSPEC 1.020 12/05/2021 0142   PHURINE 6.0 12/05/2021 0142   GLUCOSEU NEGATIVE 12/05/2021 0142   HGBUR LARGE (A) 12/05/2021 0142   BILIRUBINUR SMALL (A) 12/05/2021 0142   KETONESUR NEGATIVE 12/05/2021 0142   PROTEINUR >300 (A) 12/05/2021 0142   NITRITE NEGATIVE 12/05/2021 0142   LEUKOCYTESUR NEGATIVE 12/05/2021 0142    Sepsis Labs: Lactic Acid, Venous    Component Value Date/Time   LATICACIDVEN 2.2 ([facility]) 12/05/2021 0518    MICROBIOLOGY: Recent Results (from the past 240 hour(s))  Culture, blood (Routine X 2) w Reflex to ID Panel     Status: None (Preliminary result)   Collection Time: 12/04/21 10:30 PM   Specimen: BLOOD  Result Value Ref Range Status   Specimen Description BLOOD LEFT ARM  Final   Special Requests   Final    BOTTLES DRAWN AEROBIC AND ANAEROBIC Blood Culture adequate volume   Culture   Final    NO GROWTH 4 DAYS Performed at [facility], [place] [address], [place], [place] [address]    Report Status PENDING  Incomplete  SARS Coronavirus 2 by RT PCR (hospital order, performed in [facility] hospital lab) *cepheid single result test* Anterior Nasal Swab     Status: None   Collection Time: 12/05/21 12:30 AM   Specimen: Anterior Nasal Swab  Result Value Ref Range Status   SARS Coronavirus 2 by RT PCR  NEGATIVE NEGATIVE Final    Comment: (NOTE) SARS-CoV-2 target nucleic acids are NOT DETECTED.  The SARS-CoV-2 RNA is generally detectable in upper and lower respiratory specimens during the acute phase of infection. The lowest concentration of SARS-CoV-2 viral copies this assay can detect is 250 copies / mL. A negative result does not preclude SARS-CoV-2 infection and should not be used as the sole basis for treatment or other patient management decisions.  A negative result may occur with improper specimen collection / handling, submission of  specimen other than nasopharyngeal swab, presence of viral mutation(s) within the areas targeted by this assay, and inadequate number of viral copies (<250 copies / mL). A negative result must be combined with clinical observations, patient history, and epidemiological information.  Fact Sheet for Patients:   https://www.patel.info/  Fact Sheet for Healthcare Providers: https://hall.com/  This test is not yet approved or  cleared by the [place] FDA and has been authorized for detection and/or diagnosis of SARS-CoV-2 by FDA under an Emergency Use Authorization (EUA).  This EUA will remain in effect (meaning this test can be used) for the duration of the COVID-19 declaration under Section 564(b)(1) of the Act, 21 U.S.C. section 360bbb-3(b)(1), unless the authorization is terminated or revoked sooner.  Performed at [facility], [place] [address] [place], [place] [address]   Culture, blood (Routine X 2) w Reflex to ID Panel     Status: None (Preliminary result)   Collection Time: 12/05/21  1:35 AM   Specimen: BLOOD RIGHT HAND  Result Value Ref Range Status   Specimen Description BLOOD RIGHT HAND  Final   Special Requests   Final    BOTTLES DRAWN AEROBIC AND ANAEROBIC Blood Culture adequate volume   Culture   Final    NO GROWTH 3 DAYS Performed at [facility], [place] [address]  [place], [place] [address]    Report Status PENDING  Incomplete  MRSA Next Gen by PCR, Nasal     Status: None   Collection Time: 12/05/21  2:49 PM   Specimen: Nasal Mucosa; Nasal Swab  Result Value Ref Range Status   MRSA by PCR Next Gen NOT DETECTED NOT DETECTED Final    Comment: (NOTE) The GeneXpert MRSA Assay (FDA approved for NASAL specimens only), is one component of a comprehensive MRSA colonization surveillance program. It is not intended to diagnose MRSA infection nor to guide or monitor treatment for MRSA infections. Test performance is not FDA approved in patients less than 64 years old. Performed at [facility], [place] [address] [place], [place] [address]     RADIOLOGY STUDIES/RESULTS: No results found.   LOS: 4 days   Oren Binet, MD  [facility]    To contact the attending provider between 7A-7P or the covering provider during after hours 7P-7A, please log into the web site [email or web address] and access using universal [place] password for that web site. If you do not have the password, please call the hospital operator.  12/08/2021, 9:48 AM

## 2021-12-08 NOTE — Progress Notes (Signed)
Admit: 12/04/2021 LOS: 4  83F AKI, transaminitis, panctyopenia, recent trigeminal pain started on carbamazepine  Subjective:  UOP 2.9L SCr down to 3.5; K 2.3; gettign KCl IV and PO BPs stable Severe TCP, WBC 2.3 Marginal PO, still on IVFs NaHCO3  08/23 0701 - 08/24 0700 In: 825.3 [I.V.:547.7; IV Piggyback:277.6] Out: 2900 [Urine:2900]  Filed Weights   12/06/21 0500 12/07/21 0500 12/08/21 0500  Weight: 75.3 kg 75.3 kg 77.9 kg    Scheduled Meds:  Chlorhexidine Gluconate Cloth  6 each Topical Daily   fluticasone  2 spray Each Nare Daily   montelukast  10 mg Oral QHS   pantoprazole  40 mg Oral Daily   Continuous Infusions:  ceFEPime (MAXIPIME) IV 2 g (12/07/21 2147)   potassium chloride 10 mEq (12/08/21 1128)   sodium bicarbonate 150 mEq in sterile water 1,150 mL infusion 75 mL/hr at 12/07/21 2234   PRN Meds:.docusate sodium, HYDROmorphone (DILAUDID) injection, menthol-cetylpyridinium, ondansetron (ZOFRAN) IV, mouth rinse, polyethylene glycol  Current Labs: reviewed    Physical Exam:  Blood pressure 119/74, pulse 88, temperature 99 F (37.2 C), temperature source Oral, resp. rate (!) 25, weight 77.9 kg, last menstrual period 05/22/2012, SpO2 92 %. GEN: NAD, conversant, clearly feels unwell ENT: NCAT EYES: EOMI CV: Regular, normal S1 and S2 PULM: CTA B ABD: Tender to palpation, no distention, no rebound or guarding SKIN: No rashes or lesions EXT: No edema  A Resolvign nonoliguric AKI: Hopefully this is predominantly prerenal/potential ATN UA not consistent with GN Renal ultrasound without acute structural issues Continue supportive care Change to LR @ 35mL/h pending adequate PO No HD indications Hypokalemia: repleting, add 34mEq/d tomorrow Inc AG metabolic acidosis, lactate nto high enough to explain; trend probably from renal failure and liver failure; trend Transaminitis: GI following, on acetylcysteine, work-up in process; LFTs downtrending; TBili  stable Pancytopenia, TCP per TRH Fevers, unclear cause, cultures pending, on cefepime; BCx NGTD Recent trigeminal neuralgia, carbamazepine prescribed  P As above Change IVFs Trend acidosis Medication Issues; Preferred narcotic agents for pain control are hydromorphone, fentanyl, and methadone. Morphine should not be used.  Baclofen should be avoided Avoid oral sodium phosphate and magnesium citrate based laxatives / bowel preps    Sabra Heck MD 12/08/2021, 11:53 AM  Recent Labs  Lab 12/06/21 0111 12/06/21 1137 12/07/21 0131 12/07/21 1240 12/08/21 0047  NA 140   < > 143 141 138  K 3.0*   < > 3.0* 2.9* 2.3*  CL 107   < > 101 97* 95*  CO2 12*   < > 20* 22 25  GLUCOSE 153*   < > 102* 96 132*  BUN 95*   < > 90* 88* 80*  CREATININE 4.30*   < > 3.90* 3.66* 3.47*  CALCIUM 7.1*   < > 7.3* 7.2* 6.6*  PHOS 4.5  --  2.3*  --  3.0   < > = values in this interval not displayed.    Recent Labs  Lab 12/04/21 1339 12/05/21 0130 12/06/21 0111 12/06/21 1137 12/07/21 0131 12/08/21 0047  WBC 1.8* 1.8*   < > 2.9* 2.5* 2.3*  NEUTROABS 1.3* 1.3*  --  2.3  --   --   HGB 13.6 11.0*   < > 11.9* 11.1* 9.9*  HCT 40.5 32.2*   < > 33.1* 31.9* 27.5*  MCV 85.1 83.6   < > 79.2* 81.2 78.8*  PLT 41* 24*   < > 19* 19* 11*   < > = values in this interval not  displayed.

## 2021-12-08 NOTE — Progress Notes (Signed)
HOSPITAL MEDICINE OVERNIGHT EVENT NOTE    Notified by pharmacy that per discussion with poison control this morning considering it is currently okay to discontinue the acetylcysteine infusion.  Order placed to do so.  Marinda Elk  MD [facility]

## 2021-12-08 NOTE — Progress Notes (Signed)
? ?  Inpatient Rehab Admissions Coordinator : ? ?Per therapy recommendations, patient was screened for CIR candidacy by Linette Gunderson RN MSN.  At this time patient appears to be a potential candidate for CIR. I will place a rehab consult per protocol for full assessment. Please call me with any questions. ? ?Larz Mark RN MSN ?Admissions Coordinator ?[phone] ?  ?

## 2021-12-08 NOTE — Progress Notes (Addendum)
Initial Nutrition Assessment  DOCUMENTATION CODES:   Not applicable  INTERVENTION:   Multivitamin w/ minerals daily Ensure Enlive po BID, each supplement provides 350 kcal and 20 grams of protein. Oral swabs Liberalize pt diet to regular due to poor appetite and minimal intake Extra gravy with all trays  NUTRITION DIAGNOSIS:   Inadequate oral intake related to chronic illness, decreased appetite as evidenced by per patient/family report.  GOAL:   Patient will meet greater than or equal to 90% of their needs  MONITOR:   PO intake, Supplement acceptance, I & O's, Labs  REASON FOR ASSESSMENT:   Consult Assessment of nutrition requirement/status  ASSESSMENT:   56 y.o. female presented to the ED with weakness, lethargy, and poor PO intake. PMH includes Sjogren's syndrome. Pt admitted with acute hepatitis, acute metabolic encephalopathy, AKI, and SIRS.   8/21 - diet advanced to clear liquids 8/23 - transferred to the floor  Pt and family provide history. Reports that she has not been eating much, but is improving. States that she has been on soft foods, I.e. jello, broth, eggs, and grits. Pt drinking a lot of water due to dry mouth secondary to her Sjogren's syndrome. RD discussed ways to help with decreased salvation. RD to add side of gravy to all trays for pt to use to moisten foods, RN to provide oral swabs for pt to be able to moisten mouth. Pt reports that she typically drinks 2 Ensures per day at home; RD to order for pt to continue regimen from home.  Per EMR, pt ate 50% of breakfast yesterday.  Reports that she threw up this morning but was when she was trying to take a medication and the pill was large.   Pt reports a UBW of 165# and does not think that she has lost much/if any weight recently. No clinically significant weight loss per chart review within the past year.   Medications reviewed and include: Protonix, Potassium Chloride, IV antibiotics  Labs reviewed:  Potassium 2.4, BUN 63, Creatinine 2.53, Magnesium 1.7  NUTRITION - FOCUSED PHYSICAL EXAM:  Deferred to follow-up.  Diet Order:   Diet Order             Diet regular Room service appropriate? Yes; Fluid consistency: Thin  Diet effective now                   EDUCATION NEEDS:   No education needs have been identified at this time  Skin:  Skin Assessment: Reviewed RN Assessment  Last BM:  8/21  Height:   Ht Readings from Last 1 Encounters:  12/08/21 5\' 9"  (1.753 m)    Weight:   Wt Readings from Last 1 Encounters:  12/09/21 74.9 kg    Ideal Body Weight:  65.9 kg  BMI:  Body mass index is 24.38 kg/m.  Estimated Nutritional Needs:   Kcal:  1800-2000  Protein:  90-105 grams  Fluid:  >/= 1.8 L    12/11/21 RD, LDN Clinical Dietitian See [facility] for contact information.

## 2021-12-08 NOTE — Progress Notes (Signed)
MEDICATION RELATED CONSULT NOTE - FOLLOW UP   Pharmacy Consult for IV Acetylcysteine  Indication: APAP overdose vs Tegretol hepatotoxicity   Allergies  Allergen Reactions   Sulfa Antibiotics Anaphylaxis, Hives and Swelling    Swelling of the face and tongue     Patient Measurements: Weight: 75.3 kg (166 lb 0.1 oz)  Vital Signs: Temp: 99 F (37.2 C) (08/24 0045) Temp Source: Oral (08/24 0045) BP: 112/71 (08/24 0045) Pulse Rate: 102 (08/24 0045) Intake/Output from previous day: 08/23 0701 - 08/24 0700 In: 825.3 [I.V.:547.7; IV Piggyback:277.6] Out: 2550 [Urine:2550] Intake/Output from this shift: Total I/O In: -  Out: 1200 [Urine:1200]  Labs: Recent Labs    12/05/21 0252 12/05/21 2232 12/06/21 0111 12/06/21 1137 12/06/21 2111 12/07/21 0131 12/07/21 1240 12/08/21 0047  WBC  --    < > 2.5* 2.9*  --  2.5*  --  2.3*  HGB  --    < > 11.5* 11.9*  --  11.1*  --  9.9*  HCT  --    < > 32.1* 33.1*  --  31.9*  --  27.5*  PLT  --    < > 20* 19*  --  19*  --  11*  CREATININE 4.28*   < > 4.30* 4.19*   < > 3.90* 3.66* 3.47*  MG  --   --  2.3  --   --  2.0  --  1.6*  PHOS  --   --  4.5  --   --  2.3*  --  3.0  ALBUMIN 2.0*   < > 1.9*  --   --  1.9* 1.8* 1.6*  PROT 6.7   < > 6.9  --   --  6.9 6.8 6.0*  AST 7,050*   < > 5,812*  --   --  2,866* 2,611* 1,805*  ALT 2,127*   < > 2,031*  --   --  1,607* 1,328* 1,018*  ALKPHOS 65   < > 63  --   --  83 101 101  BILITOT 1.5*   < > 2.6*  --   --  4.0* 5.1* 4.9*  BILIDIR 1.0*  --   --   --   --   --   --   --   IBILI 0.5  --   --   --   --   --   --   --    < > = values in this interval not displayed.   Estimated Creatinine Clearance: 18.7 mL/min (A) (by C-G formula based on SCr of 3.47 mg/dL (H)).     Assessment: LFT's trending down INR 2.7>>1.3>>1.2  Per poison control: recommend DC IV Acetylcysteine   Plan:  Discussed with Dr. Leafy Half  DC IV Acetylcysteine   Abran Duke, PharmD, BCPS Clinical Pharmacist Phone:  [phone]

## 2021-12-08 NOTE — Progress Notes (Signed)
PT Cancellation Note  Patient Details Name: Megan Reid MRN: [id number] DOB: 09-04-1965   Cancelled Treatment:    Reason Eval/Treat Not Completed: (P) Other (comment) (Attempt before lunch however pt had recently returned to bed from sitting up in chair. In afternoon, platelets remain ~12k, will plan to see next date in AM pending stable labs.) Will continue efforts per PT plan of care as schedule permits.   Dorathy Kinsman Dayannara Pascal 12/08/2021, 6:12 PM

## 2021-12-09 DIAGNOSIS — D696 Thrombocytopenia, unspecified: Secondary | ICD-10-CM | POA: Diagnosis not present

## 2021-12-09 DIAGNOSIS — M3501 Sicca syndrome with keratoconjunctivitis: Secondary | ICD-10-CM | POA: Diagnosis not present

## 2021-12-09 DIAGNOSIS — B179 Acute viral hepatitis, unspecified: Secondary | ICD-10-CM | POA: Diagnosis not present

## 2021-12-09 DIAGNOSIS — N179 Acute kidney failure, unspecified: Secondary | ICD-10-CM | POA: Diagnosis not present

## 2021-12-09 LAB — CBC
HCT: 27.9 % — ABNORMAL LOW (ref 36.0–46.0)
HCT: 30.3 % — ABNORMAL LOW (ref 36.0–46.0)
Hemoglobin: 9.3 g/dL — ABNORMAL LOW (ref 12.0–15.0)
Hemoglobin: 9.9 g/dL — ABNORMAL LOW (ref 12.0–15.0)
MCH: 27.9 pg (ref 26.0–34.0)
MCH: 27.9 pg (ref 26.0–34.0)
MCHC: 33.3 g/dL (ref 30.0–36.0)
MCHC: 32.7 g/dL (ref 30.0–36.0)
MCV: 83.8 fL (ref 80.0–100.0)
MCV: 85.4 fL (ref 80.0–100.0)
Platelets: 14 10*3/uL — CL (ref 150–400)
Platelets: 20 10*3/uL — CL (ref 150–400)
RBC: 3.33 MIL/uL — ABNORMAL LOW (ref 3.87–5.11)
RBC: 3.55 MIL/uL — ABNORMAL LOW (ref 3.87–5.11)
RDW: 14 % (ref 11.5–15.5)
RDW: 14.2 % (ref 11.5–15.5)
WBC: 1.9 10*3/uL — ABNORMAL LOW (ref 4.0–10.5)
WBC: 1.8 10*3/uL — ABNORMAL LOW (ref 4.0–10.5)
nRBC: 0 % (ref 0.0–0.2)
nRBC: 0 % (ref 0.0–0.2)

## 2021-12-09 LAB — COMPREHENSIVE METABOLIC PANEL
ALT: 702 U/L — ABNORMAL HIGH (ref 0–44)
AST: 1011 U/L — ABNORMAL HIGH (ref 15–41)
Albumin: 1.8 g/dL — ABNORMAL LOW (ref 3.5–5.0)
Alkaline Phosphatase: 116 U/L (ref 38–126)
Anion gap: 11 (ref 5–15)
BUN: 63 mg/dL — ABNORMAL HIGH (ref 6–20)
CO2: 28 mmol/L (ref 22–32)
Calcium: 7.1 mg/dL — ABNORMAL LOW (ref 8.9–10.3)
Chloride: 100 mmol/L (ref 98–111)
Creatinine, Ser: 2.53 mg/dL — ABNORMAL HIGH (ref 0.44–1.00)
GFR, Estimated: 22 mL/min — ABNORMAL LOW (ref >60)
Glucose, Bld: 95 mg/dL (ref 70–99)
Potassium: 2.4 mmol/L — CL (ref 3.5–5.1)
Sodium: 139 mmol/L (ref 135–145)
Total Bilirubin: 3.5 mg/dL — ABNORMAL HIGH (ref 0.3–1.2)
Total Protein: 6.2 g/dL — ABNORMAL LOW (ref 6.5–8.1)

## 2021-12-09 LAB — PHOSPHORUS: Phosphorus: 3.5 mg/dL (ref 2.5–4.6)

## 2021-12-09 LAB — VITAMIN B12: Vitamin B-12: 3116 pg/mL — ABNORMAL HIGH (ref 180–914)

## 2021-12-09 LAB — BASIC METABOLIC PANEL
Anion gap: 10 (ref 5–15)
BUN: 54 mg/dL — ABNORMAL HIGH (ref 6–20)
CO2: 26 mmol/L (ref 22–32)
Calcium: 7.4 mg/dL — ABNORMAL LOW (ref 8.9–10.3)
Chloride: 101 mmol/L (ref 98–111)
Creatinine, Ser: 2.26 mg/dL — ABNORMAL HIGH (ref 0.44–1.00)
GFR, Estimated: 25 mL/min — ABNORMAL LOW (ref >60)
Glucose, Bld: 125 mg/dL — ABNORMAL HIGH (ref 70–99)
Potassium: 3.5 mmol/L (ref 3.5–5.1)
Sodium: 137 mmol/L (ref 135–145)

## 2021-12-09 LAB — CULTURE, BLOOD (ROUTINE X 2)
Culture: NO GROWTH
Special Requests: ADEQUATE

## 2021-12-09 LAB — MAGNESIUM: Magnesium: 1.7 mg/dL (ref 1.7–2.4)

## 2021-12-09 MED ORDER — ADULT MULTIVITAMIN W/MINERALS CH
1.0000 | ORAL_TABLET | Freq: Every day | ORAL | Status: DC
  Administered 2021-12-09 – 2021-12-12 (×4): 1 via ORAL
  Filled 2021-12-09 (×4): qty 1

## 2021-12-09 MED ORDER — POTASSIUM CHLORIDE 10 MEQ/100ML IV SOLN
10.0000 meq | INTRAVENOUS | Status: AC
  Administered 2021-12-09 (×5): 10 meq via INTRAVENOUS
  Filled 2021-12-09 (×5): qty 100

## 2021-12-09 MED ORDER — POTASSIUM CHLORIDE 20 MEQ PO PACK
40.0000 meq | PACK | Freq: Once | ORAL | Status: AC
  Administered 2021-12-09: 40 meq via ORAL
  Filled 2021-12-09: qty 2

## 2021-12-09 MED ORDER — SODIUM CHLORIDE 0.9 % IV SOLN
2.0000 g | INTRAVENOUS | Status: AC
  Administered 2021-12-09 – 2021-12-10 (×2): 2 g via INTRAVENOUS
  Filled 2021-12-09 (×2): qty 20

## 2021-12-09 MED ORDER — ENSURE ENLIVE PO LIQD
237.0000 mL | Freq: Two times a day (BID) | ORAL | Status: DC
  Administered 2021-12-09 – 2021-12-12 (×5): 237 mL via ORAL

## 2021-12-09 MED ORDER — POTASSIUM CHLORIDE 10 MEQ/100ML IV SOLN
INTRAVENOUS | Status: AC
  Administered 2021-12-09: 10 meq via INTRAVENOUS
  Filled 2021-12-09: qty 100

## 2021-12-09 MED ORDER — MAGNESIUM SULFATE 4 GM/100ML IV SOLN
4.0000 g | Freq: Once | INTRAVENOUS | Status: AC
Start: 2021-12-09 — End: 2021-12-09
  Administered 2021-12-09: 4 g via INTRAVENOUS
  Filled 2021-12-09: qty 100

## 2021-12-09 MED ORDER — POTASSIUM CHLORIDE 20 MEQ PO PACK
40.0000 meq | PACK | Freq: Once | ORAL | Status: AC
Start: 2021-12-09 — End: 2021-12-09
  Administered 2021-12-09: 40 meq via ORAL
  Filled 2021-12-09: qty 2

## 2021-12-09 NOTE — Progress Notes (Signed)
Physical Therapy Treatment Patient Details Name: Megan Reid MRN: [id number] DOB: 05/09/1965 Today's Date: 12/09/2021   History of Present Illness 56 y/o female presented to ED on 12/04/21 for lethargy, weakness, and poor PO intake. Found to severe transaminitis and new renal failure. PMH: Sjogren's syndrome, recent trigeminal neuralgia, asthma    PT Comments    Pt was seen with MD approval with low platelet count, very lethargic but made effort to work with PT.  Her plan is to continue to pursue CIR approval, with pt agreeing to work on the therapy goals as outlined on POC.  Standing is a challenge with her limited control of balance and her light headed feelings.  BP was not orthostatic when checked but does have lower endurance.  Follow up with all goals of PT and focus on being OOB to chair as tolerated.   Recommendations for follow up therapy are one component of a multi-disciplinary discharge planning process, led by the attending physician.  Recommendations may be updated based on patient status, additional functional criteria and insurance authorization.  Follow Up Recommendations  Acute inpatient rehab (3hours/day)     Assistance Recommended at Discharge Frequent or constant Supervision/Assistance  Patient can return home with the following Two people to help with walking and/or transfers;A lot of help with bathing/dressing/bathroom;Assistance with cooking/housework;Assist for transportation;Help with stairs or ramp for entrance   Equipment Recommendations  Hospital bed;Wheelchair cushion (measurements PT);Wheelchair (measurements PT);Rolling walker (2 wheels);BSC/3in1    Recommendations for Other Services Rehab consult     Precautions / Restrictions Precautions Precautions: Fall Precaution Comments: PICC Restrictions Weight Bearing Restrictions: No     Mobility  Bed Mobility Overal bed mobility: Needs Assistance Bed Mobility: Supine to Sit, Sit to Supine     Supine  to sit: Mod assist Sit to supine: Mod assist, +2 for physical assistance, +2 for safety/equipment   General bed mobility comments: mainly help for LE's out and back    Transfers Overall transfer level: Needs assistance Equipment used: 1 person hand held assist Transfers: Sit to/from Stand Sit to Stand: Min assist, Mod assist           General transfer comment: mod to initiate and min to maintain    Ambulation/Gait               General Gait Details: unable to safely walk   Stairs             Wheelchair Mobility    Modified Rankin (Stroke Patients Only)       Balance Overall balance assessment: Needs assistance Sitting-balance support: Feet supported, Single extremity supported Sitting balance-Leahy Scale: Fair Sitting balance - Comments: started propped but then was able to sit unsupported   Standing balance support: Bilateral upper extremity supported, During functional activity Standing balance-Leahy Scale: Poor Standing balance comment: requires BUE support directly with PT                            Cognition Arousal/Alertness: Lethargic Behavior During Therapy: Flat affect Overall Cognitive Status: Impaired/Different from baseline Area of Impairment: Following commands, Safety/judgement, Awareness                   Current Attention Level: Selective   Following Commands: Follows one step commands with increased time Safety/Judgement: Decreased awareness of deficits, Decreased awareness of safety Awareness: Intellectual Problem Solving: Slow processing General Comments: time to initiate and process are slowed but follows through  Exercises      General Comments General comments (skin integrity, edema, etc.): Pt is very limited today, feeling bad but really wants to try to do as much as possible.  Requires help to support all mobility and BP check was negative for orthostasis      Pertinent Vitals/Pain Pain  Assessment Pain Assessment: Faces Faces Pain Scale: Hurts little more Pain Location: L arm when IV jostled Pain Descriptors / Indicators: Guarding, Grimacing Pain Intervention(s): Monitored during session, Repositioned    Home Living                          Prior Function            PT Goals (current goals can now be found in the care plan section) Acute Rehab PT Goals Patient Stated Goal: get up to move    Frequency    Min 3X/week      PT Plan Current plan remains appropriate    Co-evaluation              AM-PAC PT "6 Clicks" Mobility   Outcome Measure  Help needed turning from your back to your side while in a flat bed without using bedrails?: A Lot Help needed moving from lying on your back to sitting on the side of a flat bed without using bedrails?: A Lot Help needed moving to and from a bed to a chair (including a wheelchair)?: Total Help needed standing up from a chair using your arms (e.g., wheelchair or bedside chair)?: Total Help needed to walk in hospital room?: Total Help needed climbing 3-5 steps with a railing? : Total 6 Click Score: 8    End of Session Equipment Utilized During Treatment: Gait belt Activity Tolerance: Patient limited by fatigue;Treatment limited secondary to medical complications (Comment) Patient left: in bed;with call bell/phone within reach Nurse Communication: Mobility status PT Visit Diagnosis: Unsteadiness on feet (R26.81);Other abnormalities of gait and mobility (R26.89);Muscle weakness (generalized) (M62.81)     Time: 1610-9604 PT Time Calculation (min) (ACUTE ONLY): 19 min  Charges:  $Therapeutic Activity: 8-22 mins    Ivar Drape 12/09/2021, 12:45 PM  Samul Dada, PT PhD Acute Rehab Dept. Number: [facility] [id number] and [facility] [phone]

## 2021-12-09 NOTE — Progress Notes (Signed)
PT Cancellation Note  Patient Details Name: Megan Reid MRN: [id number] DOB: 1966-03-19   Cancelled Treatment:    Reason Eval/Treat Not Completed: Medical issues which prohibited therapy.  Continues to have critically low platelets, retry at another time.   Ivar Drape 12/09/2021, 10:49 AM  Samul Dada, PT PhD Acute Rehab Dept. Number: [facility] [id number] and [facility] [phone]

## 2021-12-09 NOTE — Progress Notes (Addendum)
PROGRESS NOTE        PATIENT DETAILS Name: Megan Reid Age: 56 y.o. Sex: female Date of Birth: Mar 12, 1966 Admit Date: 12/04/2021 Admitting Physician Charlotte Sanes, MD OYD:XAJOINOMVEH, Soyla Murphy, MD  Brief Summary: Patient is a 56 y.o.  female with history of Sjogren syndrome-recent right facial pain-started on Tegretol-brought to the ED for lethargy, generalized body pain, nonbloody diarrhea-patient was found to have acute metabolic encephalopathy in the setting of acute hepatitis, AKI, pancytopenia-initially admitted by [facility]-but deteriorated and subsequently transferred to the ICU.  Significant events: 8/20>> admit to [facility]-encephalopathy/AKI/acute hepatitis/fever-?  Side effect of carbamazepine. 8/21>> transferred to ICU. 8/24>> transfer back to [facility].  Significant studies: 8/20>> CXR: No PNA 8/20>> abdominal ultrasound: Fatty liver, no hydronephrosis. 8/20>>Acute hepatitis serology: Negative 8/20>> Tegretol level: 4.3 (normal) 8/21>> Tylenol level:<10 8/21>> antimitochondrial /anti-smooth muscle/anti microsomal antibody: Negative 8/21>> Ehrlichia antibody panel: Negative 8/21>> Lyme Antibody: Negative 8/21>> RMSF titers: Negative 8/21>> CMV titers: Negative 8/21>> EBV PCR: Negative 8/21>> ANA: Positive, dsDNA negative 8/21>> CT chest/abdomen/pelvis: No acute abnormality identified. 8/21>> Echo: EF 60-65%  Significant microbiology data: 8/21>> blood culture: No growth 8/21>> COVID PCR: Negative.  Procedures: None  Consults: GI PCCM Nephrology  Subjective: Lying comfortably in bed-no chest pain or shortness of breath.  No epistaxis/hemoptysis/oral mucosa bleeding.  Objective: Vitals: Blood pressure 122/80, pulse 94, temperature 98.4 F (36.9 C), temperature source Oral, resp. rate (!) 22, height 5\' 9"  (1.753 m), weight 74.9 kg, last menstrual period 05/22/2012, SpO2 94 %.   Exam: Gen Exam:Alert awake-not in any  distress HEENT:atraumatic, normocephalic Chest: B/L clear to auscultation anteriorly CVS:S1S2 regular Abdomen:soft non tender, non distended Extremities:no edema Neurology: Non focal Skin: no rash   Pertinent Labs/Radiology:    Latest Ref Rng & Units 12/09/2021    5:25 AM 12/08/2021    4:43 PM 12/08/2021   12:47 AM  CBC  WBC 4.0 - 10.5 K/uL 1.9  2.4  2.3   Hemoglobin 12.0 - 15.0 g/dL 9.3  9.8  9.9   Hematocrit 36.0 - 46.0 % 27.9  28.5  27.5   Platelets 150 - 400 K/uL 14  12  11      Lab Results  Component Value Date   NA 139 12/09/2021   K 2.4 (LL) 12/09/2021   CL 100 12/09/2021   CO2 28 12/09/2021      Assessment/Plan: Acute hepatitis/pancytopenia: Felt to be due to carbamazepine-LFTs continue to downtrend.  No further recommendations from GI.  Pancytopenia felt to be due to bone marrow suppression from carbamazepine-discussed/chart reviewed with Dr. Clelia Croft on 8/24-recommends supportive care-and suspects counts will improve with time.  He recommends that if patient experiences bleeding or if platelet count <10 to transfuse platelets.  AKI: Likely hemodynamically mediated-possibly ATN-in the setting of acute illness/diarrhea/use of ibuprofen.  UA with proteinuria.  Renal function gradually improving-nephrology following.  Will need to repeat UA after acute illness has resolved to see if proteinuria still present.  Hypokalemia: Likely due to renal losses in the setting of recovery phase of ATN.  Replete and recheck.    Hypomagnesemia: Replete and recheck.  Acute metabolic encephalopathy: In the setting of hepatitis/AKI-seems to have improved-although slightly slow-completely awake and alert and answering questions appropriately.  SIRS: Afebrile overnight-did have fever over the past few days-suspect this is noninfectious-likely related to hepatitis/carbamazepine side effect.  Remains on empiric cefepime-given that she is neutropenic.  We will discuss with pharmacy and try and  narrow down regimen-we will empirically treat for total of 7 days.  History of Sjogren syndrome with trigeminal neuralgia: Supportive care-would not rechallenge with Tegretol in the future.  History of asthma: Not in exacerbation-continue bronchodilators.  BMI: Estimated body mass index is 24.38 kg/m as calculated from the following:   Height as of this encounter:  (1.753 m).   Weight as of this encounter: 74.9 kg.   Code status:   Code Status: Full Code   DVT Prophylaxis: Place and maintain sequential compression device Start: 12/06/21 0759 SCDs Start: 12/05/21 0140 SCDs Start: 12/04/21 2125   Family Communication: None at bedside-there were multiple daughters at bedside yesterday.   Disposition Plan: Status is: Inpatient Remains inpatient appropriate because: Improving hepatitis/AKI-persistent pancytopenia-not yet stable for discharge.   Planned Discharge Destination:CIR   Diet: Diet Order             Diet Heart Room service appropriate? Yes; Fluid consistency: Thin  Diet effective now                     Antimicrobial agents: Anti-infectives (From admission, onward)    Start     Dose/Rate Route Frequency Ordered Stop   12/05/21 0045  ceFEPIme (MAXIPIME) 2 g in sodium chloride 0.9 % 100 mL IVPB        2 g 200 mL/hr over 30 Minutes Intravenous Daily at 10 pm 12/05/21 0041 12/11/21 2159   12/05/21 0045  vancomycin (VANCOREADY) IVPB 1500 mg/300 mL        1,500 mg 150 mL/hr over 120 Minutes Intravenous  Once 12/05/21 0041 12/05/21 0545   12/05/21 0042  vancomycin variable dose per unstable renal function (pharmacist dosing)  Status:  Discontinued         Does not apply See admin instructions 12/05/21 0042 12/07/21 0800        MEDICATIONS: Scheduled Meds:  Chlorhexidine Gluconate Cloth  6 each Topical Daily   fluticasone  2 spray Each Nare Daily   montelukast  10 mg Oral QHS   pantoprazole  40 mg Oral Daily   potassium chloride  40 mEq Oral Daily    Continuous Infusions:  ceFEPime (MAXIPIME) IV Stopped (12/09/21 1914)   lactated ringers 50 mL/hr at 12/09/21 0608   potassium chloride 10 mEq (12/09/21 0744)   PRN Meds:.docusate sodium, HYDROmorphone (DILAUDID) injection, menthol-cetylpyridinium, ondansetron (ZOFRAN) IV, mouth rinse, polyethylene glycol   I have personally reviewed following labs and imaging studies  LABORATORY DATA: CBC: Recent Labs  Lab 12/04/21 1339 12/05/21 0130 12/06/21 0111 12/06/21 1137 12/07/21 0131 12/08/21 0047 12/08/21 1643 12/09/21 0525  WBC 1.8* 1.8*   < > 2.9* 2.5* 2.3* 2.4* 1.9*  NEUTROABS 1.3* 1.3*  --  2.3  --   --   --   --   HGB 13.6 11.0*   < > 11.9* 11.1* 9.9* 9.8* 9.3*  HCT 40.5 32.2*   < > 33.1* 31.9* 27.5* 28.5* 27.9*  MCV 85.1 83.6   < > 79.2* 81.2 78.8* 82.8 83.8  PLT 41* 24*   < > 19* 19* 11* 12* 14*   < > = values in this interval not displayed.     Basic Metabolic Panel: Recent Labs  Lab 12/06/21 0111 12/06/21 1137 12/06/21 2111 12/07/21 0131 12/07/21 1240 12/08/21 0047 12/09/21 0525  NA 140   < > 141 143 141 138 139  K 3.0*   < > 3.1* 3.0* 2.9* 2.3*  2.4*  CL 107   < > 100 101 97* 95* 100  CO2 12*   < > 22 20* 22 25 28   GLUCOSE 153*   < > 114* 102* 96 132* 95  BUN 95*   < > 92* 90* 88* 80* 63*  CREATININE 4.30*   < > 3.97* 3.90* 3.66* 3.47* 2.53*  CALCIUM 7.1*   < > 7.2* 7.3* 7.2* 6.6* 7.1*  MG 2.3  --   --  2.0  --  1.6* 1.7  PHOS 4.5  --   --  2.3*  --  3.0 3.5   < > = values in this interval not displayed.     GFR: Estimated Creatinine Clearance: 25.6 mL/min (A) (by C-G formula based on SCr of 2.53 mg/dL (H)).  Liver Function Tests: Recent Labs  Lab 12/06/21 0111 12/07/21 0131 12/07/21 1240 12/08/21 0047 12/09/21 0525  AST 5,812* 2,866* 2,611* 1,805* 1,011*  ALT 2,031* 1,607* 1,328* 1,018* 702*  ALKPHOS 63 83 101 101 116  BILITOT 2.6* 4.0* 5.1* 4.9* 3.5*  PROT 6.9 6.9 6.8 6.0* 6.2*  ALBUMIN 1.9* 1.9* 1.8* 1.6* 1.8*    Recent Labs  Lab  12/04/21 1339  LIPASE 209*    Recent Labs  Lab 12/04/21 1950 12/07/21 1240 12/08/21 0047  AMMONIA 36* 53* 37*     Coagulation Profile: Recent Labs  Lab 12/04/21 1950 12/05/21 2232 12/07/21 0131 12/08/21 0047  INR 1.4* 2.7* 1.3* 1.2     Cardiac Enzymes: Recent Labs  Lab 12/04/21 1931 12/05/21 1245 12/06/21 0111  CKTOTAL 1,110* 926* 855*     BNP (last 3 results) No results for input(s): "PROBNP" in the last 8760 hours.  Lipid Profile: No results for input(s): "CHOL", "HDL", "LDLCALC", "TRIG", "CHOLHDL", "LDLDIRECT" in the last 72 hours.  Thyroid Function Tests: No results for input(s): "TSH", "T4TOTAL", "FREET4", "T3FREE", "THYROIDAB" in the last 72 hours.  Anemia Panel: Recent Labs    12/09/21 0525  VITAMINB12 3,116*     Urine analysis:    Component Value Date/Time   COLORURINE YELLOW 12/05/2021 0142   APPEARANCEUR HAZY (A) 12/05/2021 0142   LABSPEC 1.020 12/05/2021 0142   PHURINE 6.0 12/05/2021 0142   GLUCOSEU NEGATIVE 12/05/2021 0142   HGBUR LARGE (A) 12/05/2021 0142   BILIRUBINUR SMALL (A) 12/05/2021 0142   KETONESUR NEGATIVE 12/05/2021 0142   PROTEINUR >300 (A) 12/05/2021 0142   NITRITE NEGATIVE 12/05/2021 0142   LEUKOCYTESUR NEGATIVE 12/05/2021 0142    Sepsis Labs: Lactic Acid, Venous    Component Value Date/Time   LATICACIDVEN 2.2 (HH) 12/05/2021 0518    MICROBIOLOGY: Recent Results (from the past 240 hour(s))  Culture, blood (Routine X 2) w Reflex to ID Panel     Status: None   Collection Time: 12/04/21 10:30 PM   Specimen: BLOOD  Result Value Ref Range Status   Specimen Description BLOOD LEFT ARM  Final   Special Requests   Final    BOTTLES DRAWN AEROBIC AND ANAEROBIC Blood Culture adequate volume   Culture   Final    NO GROWTH 5 DAYS Performed at [facility], [address], [place], [place]    Report Status 12/09/2021 FINAL  Final  SARS Coronavirus 2 by RT PCR (hospital order, performed in [facility]  hospital lab) *cepheid single result test* Anterior Nasal Swab     Status: None   Collection Time: 12/05/21 12:30 AM   Specimen: Anterior Nasal Swab  Result Value Ref Range Status   SARS  Coronavirus 2 by RT PCR NEGATIVE NEGATIVE Final    Comment: (NOTE) SARS-CoV-2 target nucleic acids are NOT DETECTED.  The SARS-CoV-2 RNA is generally detectable in upper and lower respiratory specimens during the acute phase of infection. The lowest concentration of SARS-CoV-2 viral copies this assay can detect is 250 copies / mL. A negative result does not preclude SARS-CoV-2 infection and should not be used as the sole basis for treatment or other patient management decisions.  A negative result may occur with improper specimen collection / handling, submission of specimen other than nasopharyngeal swab, presence of viral mutation(s) within the areas targeted by this assay, and inadequate number of viral copies (<250 copies / mL). A negative result must be combined with clinical observations, patient history, and epidemiological information.  Fact Sheet for Patients:   RoadLapTop.co.za  Fact Sheet for Healthcare Providers: http://kim-miller.com/  This test is not yet approved or  cleared by the [place] FDA and has been authorized for detection and/or diagnosis of SARS-CoV-2 by FDA under an Emergency Use Authorization (EUA).  This EUA will remain in effect (meaning this test can be used) for the duration of the COVID-19 declaration under Section 564(b)(1) of the Act, 21 U.S.C. section 360bbb-3(b)(1), unless the authorization is terminated or revoked sooner.  Performed at [facility], [address] [place], [place] [address]   Culture, blood (Routine X 2) w Reflex to ID Panel     Status: None (Preliminary result)   Collection Time: 12/05/21  1:35 AM   Specimen: BLOOD RIGHT HAND  Result Value Ref Range Status   Specimen Description  BLOOD RIGHT HAND  Final   Special Requests   Final    BOTTLES DRAWN AEROBIC AND ANAEROBIC Blood Culture adequate volume   Culture   Final    NO GROWTH 4 DAYS Performed at [facility], [address] [place], [place] [address]    Report Status PENDING  Incomplete  MRSA Next Gen by PCR, Nasal     Status: None   Collection Time: 12/05/21  2:49 PM   Specimen: Nasal Mucosa; Nasal Swab  Result Value Ref Range Status   MRSA by PCR Next Gen NOT DETECTED NOT DETECTED Final    Comment: (NOTE) The GeneXpert MRSA Assay (FDA approved for NASAL specimens only), is one component of a comprehensive MRSA colonization surveillance program. It is not intended to diagnose MRSA infection nor to guide or monitor treatment for MRSA infections. Test performance is not FDA approved in patients less than 20 years old. Performed at [facility], [address], [place], [place] [address]     RADIOLOGY STUDIES/RESULTS: No results found.   LOS: 5 days   Jeoffrey Massed, MD  [facility]    To contact the attending provider between 7A-7P or the covering provider during after hours 7P-7A, please log into the web site [email or web address] and access using universal [place] password for that web site. If you do not have the password, please call the hospital operator.  12/09/2021, 10:29 AM

## 2021-12-09 NOTE — PMR Pre-admission (Signed)
PMR Admission Coordinator Pre-Admission Assessment  Patient: Megan Reid is an 56 y.o., female MRN: [id number] DOB: [birth year] Height: 5' 9"  (175.3 cm) Weight: 77.1 kg  Insurance Information HMO:     PPO:  Yes    PCP:      IPA:      80/20:      OTHER:  PRIMARY: BC[employer] State Employees      Policy#: [id number]   Group# [id number]   Subscriber: Patient CM Name:  Megan Reid    Phone#: [phone]  Fax#: [phone]-Cert#: [id number] until 9/7 when updates are due     Employer: A[employer] MotorsBenefits:  Phone #: [phone](YQMGNOIBBplanning)     Name:  Eff. Date:  04/17/21- still active but terms 8/31 ( patient and daughter, PGilman Buttner made aware to check term date)   Deduct:  $1,250 ( $1,250 met)     Out of Pocket Max: $4,890, ($4,890 met)     CIR: 80% coverage, 20% co-insurance     SNF: 80%, 20% co-insurance  Outpatient:  $10 or $52 co-pay per visit pending provider type, limited by med nec.     Home Health:80% coverage, 20% co-insurance, limited by med nDelma Post     DME:  80%    Co-Pay: 20% Providers:  SECONDARY: BCBS Policy#:[id number]  Financial Counselor:       Phone#:   The "Data Collection Information Summary" for patients in Inpatient Rehabilitation Facilities with attached "Privacy Act SLaneRecords" was provided and verbally reviewed with: N/A  Emergency Contact Information Contact Information     Name Relation Home Work Mobile   MDundalkSpouse [phone]  MDo[facility] Park Surgery CenterDaughter   [phone]  Megan Reid  [phone]     Current Medical History  Patient Admitting Diagnosis: Acute Hepatitis  History of Present Illness: Megan Reid a 56yo woman with hx of sjogrens, recent trigeminal pain on R, pancytopenia, Asthma, lumbar spinal stenosis. [facility], poor po intake for several days.  About 5 days of non bloody diarrhea,  1 episode of vomiting early in the week.  Some diffuse abdominal pain x several daily.    GI consulted.  Patient placed on acetylcysteine.  Patient had recently started on Carbamazepine for trigeminal pain.   Acute hepatitis felt due to Carbamazepine. GI recommends supportive care. Transfuse if bleeding or paltelet count < 10. AKI in the setting of acute illness, diarhea, and use of ibuprofen. Renal funcion gradually improving. Followup outpaeitn with Nephrology. Acute metabloic encepahalopathy improving, but still somewhat cognitively slow. HCTZ remains on hold given AKI. On lopressor.  SIRS and afebrile for 2 days. Felt noninfectious. Initially on Cefepime but narrowed to Rocephin with plans to complete 7 days.  [facility] admission coordinator and physician.  Past Medical History  Past Medical History:  Diagnosis Date   Arthralgia 02/14/2016   Asthma    Autoimmune disease ([place] 02/14/2016   Positive RF 139 CCP Negative  Positive ANA 1:640 Positive Ro Positive [place] Elevated ESR 105 - Sjorgen's Disease   DDD (degenerative disc disease), lumbar 02/14/2016   Fatigue 02/14/2016   Gastritis 02/14/2016   Hot flashes    Migraines 02/14/2016   Osteoarthritis of both hands 02/14/2016   Osteoarthritis of both knees 02/14/2016   Vitamin D deficiency 02/14/2016   Has the patient had major surgery during 100  days prior to admission? No  Family History   family history includes Acromegaly in her sister; Breast cancer (age of onset: 76) in her paternal aunt; COPD in her father; Heart failure in her mother; Hypertension in her sister; Kidney failure in her mother; Lupus in her mother.  Current Medications  Current Facility-Administered Medications:    Chlorhexidine Gluconate Cloth 2 % PADS 6 each, 6 each, Topical, Daily, Elie Confer, Sarah F, NP, 6 each at 12/08/21 0800   docusate sodium (COLACE) capsule 100 mg, 100 mg, Oral, BID PRN,  Collier Bullock, MD, 100 mg at 12/11/21 1027   feeding supplement (ENSURE ENLIVE / ENSURE PLUS) liquid 237 mL, 237 mL, Oral, BID BM, Ghimire, Shanker M, MD, 237 mL at 12/12/21 0937   fluticasone (FLONASE) 50 MCG/ACT nasal spray 2 spray, 2 spray, Each Nare, Daily, Margaretha Seeds, MD, 2 spray at 12/10/21 1007   menthol-cetylpyridinium (CEPACOL) lozenge 3 mg, 1 lozenge, Oral, PRN, Candee Furbish, MD, 3 mg at 12/05/21 1821   metoprolol tartrate (LOPRESSOR) tablet 25 mg, 25 mg, Oral, BID, Thurnell Lose, MD, 25 mg at 12/12/21 0938   montelukast (SINGULAIR) tablet 10 mg, 10 mg, Oral, QHS, Candee Furbish, MD, 10 mg at 12/11/21 2230   multivitamin with minerals tablet 1 tablet, 1 tablet, Oral, Daily, Ghimire, Henreitta Leber, MD, 1 tablet at 12/12/21 0936   ondansetron (ZOFRAN) injection 4 mg, 4 mg, Intravenous, Q8H PRN, Magdalen Spatz, NP, 4 mg at 12/06/21 1827   Oral care mouth rinse, 15 mL, Mouth Rinse, PRN, Candee Furbish, MD   pantoprazole (PROTONIX) EC tablet 40 mg, 40 mg, Oral, Daily, Candee Furbish, MD, 40 mg at 12/12/21 0936   polyethylene glycol (MIRALAX / GLYCOLAX) packet 17 g, 17 g, Oral, Daily PRN, Collier Bullock, MD, 17 g at 12/11/21 1026  Patients Current Diet:  Diet Order             Diet - low sodium heart healthy           Diet regular Room service appropriate? Yes; Fluid consistency: Thin  Diet effective now                  Precautions / Restrictions Precautions Precautions: Fall Precaution Comments: PICC Restrictions Weight Bearing Restrictions: No   Has the patient had 2 or more falls or a fall with injury in the past year? No  Prior Activity Level Community (5-7x/wk): independent  Prior Functional Level Self Care: Did the patient need help bathing, dressing, using the toilet or eating? Independent  Indoor Mobility: Did the patient need assistance with walking from room to room (with or without device)? Independent  Stairs: Did the patient need  assistance with internal or external stairs (with or without device)? Independent  Functional Cognition: Did the patient need help planning regular tasks such as shopping or remembering to take medications? Independent  Patient Information Are you of Hispanic, Latino/a,or Spanish origin?: A. No, not of Hispanic, Latino/a, or Spanish origin What is your race?: B. Black or African American Do you need or want an interpreter to communicate with a doctor or health care staff?: 0. No  Patient's Response To:  Health Literacy and Transportation Is the patient able to respond to health literacy and transportation needs?: Yes Health Literacy - How often do you need to have someone help you when you read instructions, pamphlets, or other written material from your doctor or pharmacy?: Never In the past 12 months, has lack of  transportation kept you from medical appointments or from getting medications?: No In the past 12 months, has lack of transportation kept you from meetings, work, or from getting things needed for daily living?: No  Development worker, international aid / [place] Devices/Equipment: None Home Equipment: None  Prior Device Use: Indicate devices/aids used by the patient prior to current illness, exacerbation or injury? None of the above  Current Functional Level Cognition  Overall Cognitive Status: Impaired/Different from baseline Current Attention Level: Selective Orientation Level: Oriented X4 Following Commands: Follows one step commands with increased time Safety/Judgement: Decreased awareness of deficits, Decreased awareness of safety General Comments: time to initiate and process are slowed but follows through    Extremity Assessment (includes Sensation/Coordination)  Upper Extremity Assessment: Generalized weakness, RUE deficits/detail RUE Deficits / Details: Pt keeping arm tight to her side with elbow flexed, unsure what is tone verses resistance RUE: Shoulder  pain with ROM RUE Coordination: decreased fine motor, decreased gross motor  Lower Extremity Assessment: Defer to PT evaluation    ADLs  Overall ADL's : Needs assistance/impaired Eating/Feeding: Set up, Sitting Grooming: Wash/dry hands, Wash/dry face, Set up, Sitting Grooming Details (indicate cue type and reason): completed on BSC Upper Body Bathing: Minimal assistance, Sitting Lower Body Bathing: Moderate assistance, +2 for physical assistance, +2 for safety/equipment, Sitting/lateral leans, Sit to/from stand Upper Body Dressing : Minimal assistance, Sitting Lower Body Dressing: Moderate assistance, +2 for physical assistance, +2 for safety/equipment, Sitting/lateral leans, Sit to/from stand Toilet Transfer: Moderate assistance, Stand-pivot, BSC/3in1 Toilet Transfer Details (indicate cue type and reason): Mod A to power up and steady with pivot to BSC, LLE giving under hear Toileting- Clothing Manipulation and Hygiene: Moderate assistance, Sitting/lateral lean, Sit to/from stand Toileting - Clothing Manipulation Details (indicate cue type and reason): Mod A for pericare, especially in standing due to poor balance Functional mobility during ADLs: Moderate assistance, Rolling walker (2 wheels) General ADL Comments: Requriing assist for all standing tasks, continues to have slow processsing and needing repeated simple commands to follow through    Mobility  Overal bed mobility: Needs Assistance Bed Mobility: Supine to Sit, Sit to Supine Supine to sit: Mod assist Sit to supine: Min guard General bed mobility comments: Mod A to pull to sitting, and shifting hips to EOB    Transfers  Overall transfer level: Needs assistance Equipment used: Rolling walker (2 wheels) Transfers: Sit to/from Stand Sit to Stand: Min assist, Mod assist General transfer comment: mod to initiate and min to maintain    Ambulation / Gait / Stairs / Wheelchair Mobility  Ambulation/Gait General Gait Details:  unable to safely walk    Posture / Balance Dynamic Sitting Balance Sitting balance - Comments: started propped on her knees but then was able to sit unsupported Balance Overall balance assessment: Needs assistance Sitting-balance support: Feet supported, Single extremity supported Sitting balance-Leahy Scale: Fair Sitting balance - Comments: started propped on her knees but then was able to sit unsupported Standing balance support: Bilateral upper extremity supported, During functional activity Standing balance-Leahy Scale: Poor Standing balance comment: requires BUE support on RW    Special needs/care consideration    Previous Home Environment  Living Arrangements: Spouse/significant other  Lives With: Spouse Available Help at Discharge: Family Type of Home: House Home Layout: One level Home Access: Stairs to enter Entrance Stairs-Rails: Right Entrance Stairs-Number of Steps: 5 Bathroom Shower/Tub: Chiropodist: Standard Bathroom Accessibility: Yes How Accessible: Accessible via walker [place]: No  Discharge Living Setting Plans  for Discharge Living Setting: Patient's home, Lives with (comment) (spouse, daughters) Type of Home at Discharge: House Discharge Home Layout: One level Discharge Home Access: Stairs to enter Entrance Stairs-Rails: Right Entrance Stairs-Number of Steps: 4-5 Discharge Bathroom Shower/Tub: Tub/shower unit Discharge Bathroom Toilet: Handicapped height Discharge Bathroom Accessibility: Yes How Accessible: Accessible via walker Does the patient have any problems obtaining your medications?: No  Social/Family/Support Systems Patient Roles: Spouse Anticipated Caregiver: Spouse, daughters Anticipated Caregiver's Contact Information: [phone] Caregiver Availability: 24/7 Discharge Plan Discussed with Primary Caregiver: Yes Is Caregiver In Agreement with Plan?: Yes Does Caregiver/Family have Issues with  Lodging/Transportation while Pt is in Rehab?: No  Goals Patient/Family Goal for Rehab: Mod I with PT, OT and SLP Expected length of stay: 7-10 days Pt/Family Agrees to Admission and willing to participate: Yes Program Orientation Provided & Reviewed with Pt/Caregiver Including Roles  & Responsibilities: Yes  Barriers to Discharge: Insurance for SNF coverage  Decrease burden of Care through IP rehab admission: OtherN/A  Possible need for SNF placement upon discharge: not anitcipated  Patient Condition: I have reviewed medical records from [facility], spoken with  [facility] , and patient and daughter. I met with patient at the bedside for inpatient rehabilitation assessment.  Patient will benefit from ongoing PT and OT, can actively participate in 3 hours of therapy a day 5 days of the week, and can make measurable gains during the admission.  Patient will also benefit from the coordinated team approach during an Inpatient Acute Rehabilitation admission.  The patient will receive intensive therapy as well as Rehabilitation physician, nursing, social worker, and care management interventions.  Due to safety, skin/wound care, disease management, medication administration, pain management, and patient education the patient requires 24 hour a day rehabilitation nursing.  The patient is currently Min/Mod A +2 with mobility and basic ADLs.  Discharge setting and therapy post discharge at home with home health is anticipated.  Patient has agreed to participate in the Acute Inpatient Rehabilitation Program and will admit today.  Preadmission Screen Completed By: Amanda Cockayne with updates by Cleatrice Burke, 12/12/2021 11:30 AM ______________________________________________________________________   Discussed status with Dr. Letta Pate on 12/13/21 at 107 and received approval for admission today.  Admission Coordinator: Amanda Cockayne with updates by Cleatrice Burke, RN, time 1130 Date 12/12/21    Assessment/Plan: Diagnosis:debility secondary to SIRS Does the need for close, 24 hr/day Medical supervision in concert with the patient's rehab needs make it unreasonable for this patient to be served in a less intensive setting? Yes Co-Morbidities requiring supervision/potential complications: Sjogren's disease, pancytopenia, lumbar stenosis Due to bladder management, bowel management, safety, skin/wound care, disease management, medication administration, pain management, and patient education, does the patient require 24 hr/day rehab nursing? Yes Does the patient require coordinated care of a physician, rehab nurse, PT, OT, and SLP to address physical and functional deficits in the context of the above medical diagnosis(es)? Yes Addressing deficits in the following areas: balance, endurance, locomotion, strength, transferring, bowel/bladder control, bathing, dressing, feeding, grooming, toileting, cognition, and psychosocial support Can the patient actively participate in an intensive therapy program of at least 3 hrs of therapy 5 days a week? Yes The potential for patient to make measurable gains while on inpatient rehab is good Anticipated functional outcomes upon discharge from inpatient rehab: modified independent PT, modified independent OT, supervision SLP Estimated rehab length of stay to reach the above functional goals is: 7-10d Anticipated discharge destination: Home 10. Overall Rehab/Functional Prognosis: good   MD Signature: Mitzi Hansen  Eulogio Ditch M.D. [facility] Fellow Am Acad of Phys Med and Rehab Diplomate Am Board of Electrodiagnostic Med Fellow Am Board of Interventional Pain

## 2021-12-09 NOTE — Plan of Care (Signed)
?  Problem: Clinical Measurements: ?Goal: Respiratory complications will improve ?Outcome: Progressing ?Goal: Cardiovascular complication will be avoided ?Outcome: Progressing ?  ?Problem: Nutrition: ?Goal: Adequate nutrition will be maintained ?Outcome: Progressing ?  ?Problem: Coping: ?Goal: Level of anxiety will decrease ?Outcome: Progressing ?  ?Problem: Elimination: ?Goal: Will not experience complications related to bowel motility ?Outcome: Progressing ?Goal: Will not experience complications related to urinary retention ?Outcome: Progressing ?  ?

## 2021-12-09 NOTE — Progress Notes (Signed)
Admit: 12/04/2021 LOS: 5  49F AKI, transaminitis, panctyopenia, recent trigeminal pain started on carbamazepine  Subjective:  UOP 3.3L SCr down to 2.5; K 2.3; gettign KCl BPs stable Severe TCP, WBC 1.9 Improving PO  08/24 0701 - 08/25 0700 In: 3617.2 [P.O.:240; I.V.:2585; IV Piggyback:792.2] Out: 3300 [Urine:3300]  Filed Weights   12/07/21 0500 12/08/21 0500 12/09/21 0446  Weight: 75.3 kg 77.9 kg 74.9 kg    Scheduled Meds:  Chlorhexidine Gluconate Cloth  6 each Topical Daily   feeding supplement  237 mL Oral BID BM   fluticasone  2 spray Each Nare Daily   montelukast  10 mg Oral QHS   multivitamin with minerals  1 tablet Oral Daily   pantoprazole  40 mg Oral Daily   potassium chloride  40 mEq Oral Once   Continuous Infusions:  cefTRIAXone (ROCEPHIN)  IV     lactated ringers 50 mL/hr at 12/09/21 0608   potassium chloride 10 mEq (12/09/21 1035)   PRN Meds:.docusate sodium, HYDROmorphone (DILAUDID) injection, menthol-cetylpyridinium, ondansetron (ZOFRAN) IV, mouth rinse, polyethylene glycol  Current Labs: reviewed    Physical Exam:  Blood pressure 122/80, pulse (!) 101, temperature 98.4 F (36.9 C), temperature source Oral, resp. rate 20, height 5\' 9"  (1.753 m), weight 74.9 kg, last menstrual period 05/22/2012, SpO2 95 %. GEN: NAD, conversant, clearly feels unwell ENT: NCAT EYES: EOMI CV: Regular, normal S1 and S2 PULM: CTA B ABD: Tender to palpation, no distention, no rebound or guarding SKIN: No rashes or lesions EXT: No edema  A Resolving nonoliguric AKI: Hopefully this is predominantly prerenal/potential ATN UA not consistent with GN Renal ultrasound without acute structural issues Resolving with supportive care Hypokalemia: repleting, TRH managing.   Resolved Inc AG metabolic acidosis,  Transaminitis: Improved, s/p acetylcysteine, likely med effect Pancytopenia, severe TCP per TRH Fevers, unclear cause, cultures pending, on ABX per TRH; BCx NGTD Recent  trigeminal neuralgia, carbamazepine prescribed  P As above Will sign off for now.  Please call with any questions or concerns.  Pt does need follow up with nephrology and I will arrange f/u appt at [facility] MD 12/09/2021, 11:33 AM  Recent Labs  Lab 12/07/21 0131 12/07/21 1240 12/08/21 0047 12/09/21 0525  NA 143 141 138 139  K 3.0* 2.9* 2.3* 2.4*  CL 101 97* 95* 100  CO2 20* 22 25 28   GLUCOSE 102* 96 132* 95  BUN 90* 88* 80* 63*  CREATININE 3.90* 3.66* 3.47* 2.53*  CALCIUM 7.3* 7.2* 6.6* 7.1*  PHOS 2.3*  --  3.0 3.5    Recent Labs  Lab 12/04/21 1339 12/05/21 0130 12/06/21 0111 12/06/21 1137 12/07/21 0131 12/08/21 0047 12/08/21 1643 12/09/21 0525  WBC 1.8* 1.8*   < > 2.9*   < > 2.3* 2.4* 1.9*  NEUTROABS 1.3* 1.3*  --  2.3  --   --   --   --   HGB 13.6 11.0*   < > 11.9*   < > 9.9* 9.8* 9.3*  HCT 40.5 32.2*   < > 33.1*   < > 27.5* 28.5* 27.9*  MCV 85.1 83.6   < > 79.2*   < > 78.8* 82.8 83.8  PLT 41* 24*   < > 19*   < > 11* 12* 14*   < > = values in this interval not displayed.

## 2021-12-09 NOTE — Progress Notes (Signed)
Inpatient Rehab Coordinator Note:  I met with patient and daughter, Megan Reid at bedside to discuss CIR recommendations and goals/expectations of CIR stay.  We reviewed 3 hrs/day of therapy, physician follow up, and average length of stay 2 weeks (dependent upon progress) with goals of mod I. Daughter are taking turns staying with patient while she is ill. Confirm that they will be able to provide 24/7 assist at discharge. Patient and daughter are interested in pursing this. Will send insurance for prior auth for potential CIR admission.   Rehab Admissons Coordinator [place], [place], [place] [phone]

## 2021-12-09 NOTE — Progress Notes (Signed)
Occupational Therapy Treatment Patient Details Name: Megan Reid MRN: [id number] DOB: 1965/11/03 Today's Date: 12/09/2021   History of present illness 56 y/o female presented to ED on 12/04/21 for lethargy, weakness, and poor PO intake. Found to severe transaminitis and new renal failure. PMH: Sjogren's syndrome, recent trigeminal neuralgia, asthma   OT comments  Pt making good progress with OT goals. This session, pt completed multiple sit <>stands, toileting, grooming, and bed mobility. She tolerated being OOB for approximately 20 mins of activity. She continues to report being winded with increased work of breath, however her O2 sats remain above 95%. Overall requiring min to light mod A for sit<>stands with RW and mod A for toileting this session on BSC. Her LLE continues to demonstrate weakness with buckling noted with 2nd stand from [facility]. Continuing to recommend AIR, as it will maximize pt's independence and safety. OT will follow acutely.    Recommendations for follow up therapy are one component of a multi-disciplinary discharge planning process, led by the attending physician.  Recommendations may be updated based on patient status, additional functional criteria and insurance authorization.    Follow Up Recommendations  Acute inpatient rehab (3hours/day)    Assistance Recommended at Discharge Frequent or constant Supervision/Assistance  Patient can return home with the following  A lot of help with walking and/or transfers;A lot of help with bathing/dressing/bathroom;Two people to help with bathing/dressing/bathroom;Assistance with cooking/housework;Direct supervision/assist for medications management;Help with stairs or ramp for entrance   Equipment Recommendations  BSC/3in1;Tub/shower seat    Recommendations for Other Services      Precautions / Restrictions Precautions Precautions: Fall Precaution Comments: PICC Restrictions Weight Bearing Restrictions: No        Mobility Bed Mobility Overal bed mobility: Needs Assistance Bed Mobility: Supine to Sit, Sit to Supine     Supine to sit: Mod assist Sit to supine: Min guard   General bed mobility comments: Mod A to pull to sitting, and shifting hips to EOB    Transfers Overall transfer level: Needs assistance Equipment used: Rolling walker (2 wheels) Transfers: Sit to/from Stand Sit to Stand: Min assist, Mod assist           General transfer comment: mod to initiate and min to maintain     Balance Overall balance assessment: Needs assistance Sitting-balance support: Feet supported, Single extremity supported Sitting balance-Leahy Scale: Fair Sitting balance - Comments: started propped on her knees but then was able to sit unsupported   Standing balance support: Bilateral upper extremity supported, During functional activity Standing balance-Leahy Scale: Poor Standing balance comment: requires BUE support on RW                           ADL either performed or assessed with clinical judgement   ADL Overall ADL's : Needs assistance/impaired     Grooming: Wash/dry hands;Wash/dry face;Set up;Sitting Grooming Details (indicate cue type and reason): completed on [facility]                 Toilet Transfer: Moderate assistance;Stand-pivot;BSC/3in1 Toilet Transfer Details (indicate cue type and reason): Mod A to power up and steady with pivot to BSC, LLE giving under hear Toileting- Clothing Manipulation and Hygiene: Moderate assistance;Sitting/lateral lean;Sit to/from stand Toileting - Clothing Manipulation Details (indicate cue type and reason): Mod A for pericare, especially in standing due to poor balance     Functional mobility during ADLs: Moderate assistance;Rolling walker (2 wheels) General ADL Comments: Requriing assist for  all standing tasks, continues to have slow processsing and needing repeated simple commands to follow through    [facility] Assessment               Vision       Perception     Praxis      Cognition Arousal/Alertness: Lethargic Behavior During Therapy: Flat affect Overall Cognitive Status: Impaired/Different from baseline Area of Impairment: Following commands, Safety/judgement, Awareness                   Current Attention Level: Selective   Following Commands: Follows one step commands with increased time Safety/Judgement: Decreased awareness of deficits, Decreased awareness of safety Awareness: Intellectual Problem Solving: Slow processing General Comments: time to initiate and process are slowed but follows through        Exercises Exercises: Other exercises Other Exercises Other Exercises: sit <> stands x4 from bed and BSC    Shoulder Instructions       General Comments Pt with increased work of breath with activity, reporting that she is winded, however her O2 sats were 96% and highter on RA    Pertinent Vitals/ Pain       Pain Assessment Pain Assessment: Faces Faces Pain Scale: Hurts little more Pain Location: L arm when IV jostled Pain Descriptors / Indicators: Guarding, Grimacing Pain Intervention(s): Limited activity within patient's tolerance, Monitored during session, Repositioned  Home Living   Living Arrangements: Spouse/significant other Available Help at Discharge: Family Type of Home: House Home Access: Stairs to enter Technical brewer of Steps: 5 Entrance Stairs-Rails: Right Home Layout: One level     Bathroom Shower/Tub: Teacher, early years/pre: Standard Bathroom Accessibility: Yes How Accessible: Accessible via walker        Lives With: Spouse    Prior Functioning/Environment              Frequency  Min 2X/week        Progress Toward Goals  OT Goals(current goals can now be found in the care plan section)  Progress towards OT goals: Progressing toward goals  Acute Rehab OT Goals Patient Stated Goal: To get stronger OT  Goal Formulation: With patient Time For Goal Achievement: 12/21/21 Potential to Achieve Goals: Good ADL Goals Pt Will Perform Grooming: with modified independence;standing Pt Will Perform Lower Body Bathing: with modified independence;sitting/lateral leans;sit to/from stand Pt Will Perform Lower Body Dressing: with modified independence;sitting/lateral leans;sit to/from stand Pt Will Transfer to Toilet: with modified independence;ambulating Pt Will Perform Toileting - Clothing Manipulation and hygiene: with modified independence;sitting/lateral leans;sit to/from stand  Plan Discharge plan remains appropriate;Frequency remains appropriate    Co-evaluation                 AM-PAC OT "6 Clicks" Daily Activity     Outcome Measure   Help from another person eating meals?: A Little Help from another person taking care of personal grooming?: A Little Help from another person toileting, which includes using toliet, bedpan, or urinal?: A Lot Help from another person bathing (including washing, rinsing, drying)?: A Lot Help from another person to put on and taking off regular upper body clothing?: A Little Help from another person to put on and taking off regular lower body clothing?: A Lot 6 Click Score: 15    End of Session Equipment Utilized During Treatment: Gait belt;Rolling walker (2 wheels)  OT Visit Diagnosis: Unsteadiness on feet (R26.81);Other abnormalities of gait and mobility (R26.89);Muscle weakness (generalized) (M62.81)   Activity Tolerance  Patient tolerated treatment well   Patient Left in bed;with call bell/phone within reach;with bed alarm set   Nurse Communication Mobility status        Time: 9924-2683 OT Time Calculation (min): 25 min  Charges: OT General Charges $OT Visit: 1 Visit OT Treatments $Self Care/Home Management : 8-22 mins $Therapeutic Activity: 8-22 mins  Trish Mage, OTR/L  Megan Reid 12/09/2021, 3:31 PM

## 2021-12-10 DIAGNOSIS — B179 Acute viral hepatitis, unspecified: Secondary | ICD-10-CM | POA: Diagnosis not present

## 2021-12-10 DIAGNOSIS — D696 Thrombocytopenia, unspecified: Secondary | ICD-10-CM | POA: Diagnosis not present

## 2021-12-10 DIAGNOSIS — M3501 Sicca syndrome with keratoconjunctivitis: Secondary | ICD-10-CM | POA: Diagnosis not present

## 2021-12-10 DIAGNOSIS — N179 Acute kidney failure, unspecified: Secondary | ICD-10-CM | POA: Diagnosis not present

## 2021-12-10 LAB — COMPREHENSIVE METABOLIC PANEL
ALT: 539 U/L — ABNORMAL HIGH (ref 0–44)
AST: 615 U/L — ABNORMAL HIGH (ref 15–41)
Albumin: 1.8 g/dL — ABNORMAL LOW (ref 3.5–5.0)
Alkaline Phosphatase: 122 U/L (ref 38–126)
Anion gap: 7 (ref 5–15)
BUN: 55 mg/dL — ABNORMAL HIGH (ref 6–20)
CO2: 25 mmol/L (ref 22–32)
Calcium: 7.7 mg/dL — ABNORMAL LOW (ref 8.9–10.3)
Chloride: 105 mmol/L (ref 98–111)
Creatinine, Ser: 2.01 mg/dL — ABNORMAL HIGH (ref 0.44–1.00)
GFR, Estimated: 28 mL/min — ABNORMAL LOW (ref >60)
Glucose, Bld: 108 mg/dL — ABNORMAL HIGH (ref 70–99)
Potassium: 3.3 mmol/L — ABNORMAL LOW (ref 3.5–5.1)
Sodium: 137 mmol/L (ref 135–145)
Total Bilirubin: 2.2 mg/dL — ABNORMAL HIGH (ref 0.3–1.2)
Total Protein: 6 g/dL — ABNORMAL LOW (ref 6.5–8.1)

## 2021-12-10 LAB — PHOSPHORUS: Phosphorus: 2.3 mg/dL — ABNORMAL LOW (ref 2.5–4.6)

## 2021-12-10 LAB — MAGNESIUM: Magnesium: 2.2 mg/dL (ref 1.7–2.4)

## 2021-12-10 LAB — CULTURE, BLOOD (ROUTINE X 2)
Culture: NO GROWTH
Special Requests: ADEQUATE

## 2021-12-10 MED ORDER — SODIUM PHOSPHATES 45 MMOLE/15ML IV SOLN
30.0000 mmol | Freq: Once | INTRAVENOUS | Status: AC
  Administered 2021-12-10: 30 mmol via INTRAVENOUS
  Filled 2021-12-10: qty 10

## 2021-12-10 MED ORDER — POTASSIUM CHLORIDE 20 MEQ PO PACK
40.0000 meq | PACK | ORAL | Status: AC
Start: 2021-12-10 — End: 2021-12-10
  Administered 2021-12-10 (×2): 40 meq via ORAL
  Filled 2021-12-10 (×2): qty 2

## 2021-12-10 NOTE — Progress Notes (Addendum)
PROGRESS NOTE        PATIENT DETAILS Name: Megan Reid Age: 56 y.o. Sex: female Date of Birth: [birth year] Admit Date: 12/04/2021 Admitting Physician Charlotte Sanes, MD WLN:[id number], Soyla Murphy, MD  Brief Summary: Patient is a 56 y.o.  female with history of Sjogren syndrome-recent right facial pain-started on Tegretol-brought to the ED for lethargy, generalized body pain, nonbloody diarrhea-patient was found to have acute metabolic encephalopathy in the setting of acute hepatitis, AKI, pancytopenia-initially admitted by [facility]-but deteriorated and subsequently transferred to the ICU.  Significant events: 8/20>> admit to [facility]-encephalopathy/AKI/acute hepatitis/fever-?  Side effect of carbamazepine. 8/21>> transferred to ICU. 8/24>> [facility] to [facility].  Significant studies: 8/20>> CXR: No PNA 8/20>> abdominal ultrasound: Fatty liver, no hydronephrosis. 8/20>>Acute hepatitis serology: Negative 8/20>> Tegretol level: 4.3 (normal) 8/21>> Tylenol level:<10 8/21>> antimitochondrial /anti-smooth muscle/anti microsomal antibody: Negative 8/21>> Ehrlichia antibody panel: Negative 8/21>> Lyme Antibody: Negative 8/21>> RMSF titers: Negative 8/21>> CMV titers: Negative 8/21>> EBV PCR: Negative 8/21>> ANA: Positive, dsDNA negative 8/21>> CT chest/abdomen/pelvis: No acute abnormality identified. 8/21>> Echo: EF 60-65%  Significant microbiology data: 8/21>> blood culture: No growth 8/21>> COVID PCR: Negative.  Procedures: None  Consults: GI PCCM Nephrology  Subjective: No major issues overnight.  Awake/alert but still somewhat slow to respond.  Objective: Vitals: Blood pressure 118/78, pulse 89, temperature 97.9 F (36.6 C), temperature source Oral, resp. rate 18, height 5\' 9"  (1.753 m), weight 76.1 kg, last menstrual period 05/22/2012, SpO2 94 %.   Exam: Gen Exam:Alert awake-not in any distress HEENT:atraumatic, normocephalic Chest: B/L clear to  auscultation anteriorly CVS:S1S2 regular Abdomen:soft non tender, non distended Extremities:no edema Neurology: Non focal Skin: no rash   Pertinent Labs/Radiology:    Latest Ref Rng & Units 12/10/2021    6:18 AM 12/09/2021    4:38 PM 12/09/2021    5:25 AM  CBC  WBC 4.0 - 10.5 K/uL 1.5  1.8  1.9   Hemoglobin 12.0 - 15.0 g/dL 8.5  9.9  9.3   Hematocrit 36.0 - 46.0 % 25.0  30.3  27.9   Platelets 150 - 400 K/uL 27  20  14      Lab Results  Component Value Date   NA 137 12/10/2021   K 3.3 (L) 12/10/2021   CL 105 12/10/2021   CO2 25 12/10/2021      Assessment/Plan: Acute hepatitis/pancytopenia: Felt to be due to carbamazepine-LFTs continue to downtrend-platelet count slowly improving.  Hb/WBC low but stable.   Continue supportive care.  GI has signed off.  This MD discussed/chart reviewed with Dr. 12/12/2021 on 8/24-recommends supportive care-with recommendations to transfuse if bleeding or platelet count <10  AKI: Likely hemodynamically mediated-possibly ATN-in the setting of acute illness/diarrhea/use of ibuprofen.  UA with proteinuria.  Renal function gradually improving-nephrology has signed off.  Will need to repeat UA after acute illness has resolved to see if proteinuria still present.  Hypokalemia: Better today but still on the lower side-replete and recheck.  Hypomagnesemia: Repleted.  Acute metabolic encephalopathy: In the setting of hepatitis/AKI-better but still somewhat slow-continue supportive care.  SIRS: Afebrile x2 days now-suspect this is noninfectious-likely related to hepatitis/carbamazepine side effect.  Initially on cefepime-but narrowed down to Rocephin-with plans to complete x7 days of empiric antibiotics.   History of Sjogren syndrome with trigeminal neuralgia: Supportive care-would not rechallenge with Tegretol in the future.  Will need to follow-up with primary  neurologist postdischarge.  Currently without any facial pain.  History of asthma: Not in  exacerbation-continue bronchodilators.  HTN: BP stable-HCTZ remains on hold given AKI/hypokalemia.  BMI: Estimated body mass index is 24.78 kg/m as calculated from the following:   Height as of this encounter: 5\' 9"  (1.753 m).   Weight as of this encounter: 76.1 kg.   Code status:   Code Status: Full Code   DVT Prophylaxis: Place and maintain sequential compression device Start: 12/06/21 0759 SCDs Start: 12/05/21 0140 SCDs Start: 12/04/21 2125   Family Communication: Sister/daughter at bedside.  Disposition Plan: Status is: Inpatient Remains inpatient appropriate because: Severe [place]-improving AKI/hepatitis/pancytopenia-CIR probably early next week.   Planned Discharge Destination:CIR   Diet: Diet Order             Diet regular Room service appropriate? Yes; Fluid consistency: Thin  Diet effective now                     Antimicrobial agents: Anti-infectives (From admission, onward)    Start     Dose/Rate Route Frequency Ordered Stop   12/09/21 2200  cefTRIAXone (ROCEPHIN) 2 g in sodium chloride 0.9 % 100 mL IVPB        2 g 200 mL/hr over 30 Minutes Intravenous Every 24 hours 12/09/21 1040 12/11/21 2159   12/05/21 0045  ceFEPIme (MAXIPIME) 2 g in sodium chloride 0.9 % 100 mL IVPB  Status:  Discontinued        2 g 200 mL/hr over 30 Minutes Intravenous Daily at 10 pm 12/05/21 0041 12/09/21 1040   12/05/21 0045  vancomycin (VANCOREADY) IVPB 1500 mg/300 mL        1,500 mg 150 mL/hr over 120 Minutes Intravenous  Once 12/05/21 0041 12/05/21 0545   12/05/21 0042  vancomycin variable dose per unstable renal function (pharmacist dosing)  Status:  Discontinued         Does not apply See admin instructions 12/05/21 0042 12/07/21 0800        MEDICATIONS: Scheduled Meds:  Chlorhexidine Gluconate Cloth  6 each Topical Daily   feeding supplement  237 mL Oral BID BM   fluticasone  2 spray Each Nare Daily   montelukast  10 mg Oral QHS   multivitamin with minerals   1 tablet Oral Daily   pantoprazole  40 mg Oral Daily   potassium chloride  40 mEq Oral Q4H   Continuous Infusions:  cefTRIAXone (ROCEPHIN)  IV Stopped (12/10/21 0634)   PRN Meds:.docusate sodium, HYDROmorphone (DILAUDID) injection, menthol-cetylpyridinium, ondansetron (ZOFRAN) IV, mouth rinse, polyethylene glycol   I have personally reviewed following labs and imaging studies  LABORATORY DATA: CBC: Recent Labs  Lab 12/04/21 1339 12/05/21 0130 12/06/21 0111 12/06/21 1137 12/07/21 0131 12/08/21 0047 12/08/21 1643 12/09/21 0525 12/09/21 1638 12/10/21 0618  WBC 1.8* 1.8*   < > 2.9*   < > 2.3* 2.4* 1.9* 1.8* 1.5*  NEUTROABS 1.3* 1.3*  --  2.3  --   --   --   --   --   --   HGB 13.6 11.0*   < > 11.9*   < > 9.9* 9.8* 9.3* 9.9* 8.5*  HCT 40.5 32.2*   < > 33.1*   < > 27.5* 28.5* 27.9* 30.3* 25.0*  MCV 85.1 83.6   < > 79.2*   < > 78.8* 82.8 83.8 85.4 83.6  PLT 41* 24*   < > 19*   < > 11* 12* 14* 20* 27*   < > =  values in this interval not displayed.     Basic Metabolic Panel: Recent Labs  Lab 12/06/21 0111 12/06/21 1137 12/07/21 0131 12/07/21 1240 12/08/21 0047 12/09/21 0525 12/09/21 1638 12/10/21 0618  NA 140   < > 143 141 138 139 137 137  K 3.0*   < > 3.0* 2.9* 2.3* 2.4* 3.5 3.3*  CL 107   < > 101 97* 95* 100 101 105  CO2 12*   < > 20* 22 25 28 26 25   GLUCOSE 153*   < > 102* 96 132* 95 125* 108*  BUN 95*   < > 90* 88* 80* 63* 54* 55*  CREATININE 4.30*   < > 3.90* 3.66* 3.47* 2.53* 2.26* 2.01*  CALCIUM 7.1*   < > 7.3* 7.2* 6.6* 7.1* 7.4* 7.7*  MG 2.3  --  2.0  --  1.6* 1.7  --  2.2  PHOS 4.5  --  2.3*  --  3.0 3.5  --  2.3*   < > = values in this interval not displayed.     GFR: Estimated Creatinine Clearance: 32.3 mL/min (A) (by C-G formula based on SCr of 2.01 mg/dL (H)).  Liver Function Tests: Recent Labs  Lab 12/07/21 0131 12/07/21 1240 12/08/21 0047 12/09/21 0525 12/10/21 0618  AST 2,866* 2,611* 1,805* 1,011* 615*  ALT 1,607* 1,328* 1,018* 702*  539*  ALKPHOS 83 101 101 116 122  BILITOT 4.0* 5.1* 4.9* 3.5* 2.2*  PROT 6.9 6.8 6.0* 6.2* 6.0*  ALBUMIN 1.9* 1.8* 1.6* 1.8* 1.8*    Recent Labs  Lab 12/04/21 1339  LIPASE 209*    Recent Labs  Lab 12/04/21 1950 12/07/21 1240 12/08/21 0047  AMMONIA 36* 53* 37*     Coagulation Profile: Recent Labs  Lab 12/04/21 1950 12/05/21 2232 12/07/21 0131 12/08/21 0047  INR 1.4* 2.7* 1.3* 1.2     Cardiac Enzymes: Recent Labs  Lab 12/04/21 1931 12/05/21 1245 12/06/21 0111  CKTOTAL 1,110* 926* 855*     BNP (last 3 results) No results for input(s): "PROBNP" in the last 8760 hours.  Lipid Profile: No results for input(s): "CHOL", "HDL", "LDLCALC", "TRIG", "CHOLHDL", "LDLDIRECT" in the last 72 hours.  Thyroid Function Tests: No results for input(s): "TSH", "T4TOTAL", "FREET4", "T3FREE", "THYROIDAB" in the last 72 hours.  Anemia Panel: Recent Labs    12/09/21 0525  VITAMINB12 3,116*     Urine analysis:    Component Value Date/Time   COLORURINE YELLOW 12/05/2021 0142   APPEARANCEUR HAZY (A) 12/05/2021 0142   LABSPEC 1.020 12/05/2021 0142   PHURINE 6.0 12/05/2021 0142   GLUCOSEU NEGATIVE 12/05/2021 0142   HGBUR LARGE (A) 12/05/2021 0142   BILIRUBINUR SMALL (A) 12/05/2021 0142   KETONESUR NEGATIVE 12/05/2021 0142   PROTEINUR >300 (A) 12/05/2021 0142   NITRITE NEGATIVE 12/05/2021 0142   LEUKOCYTESUR NEGATIVE 12/05/2021 0142    Sepsis Labs: Lactic Acid, Venous    Component Value Date/Time   LATICACIDVEN 2.2 (HH) 12/05/2021 0518    MICROBIOLOGY: Recent Results (from the past 240 hour(s))  Culture, blood (Routine X 2) w Reflex to ID Panel     Status: None   Collection Time: 12/04/21 10:30 PM   Specimen: BLOOD  Result Value Ref Range Status   Specimen Description BLOOD LEFT ARM  Final   Special Requests   Final    BOTTLES DRAWN AEROBIC AND ANAEROBIC Blood Culture adequate volume   Culture   Final    NO GROWTH 5 DAYS Performed at [facility]  Lab,  [address], [place], [place] [address]    Report Status 12/09/2021 FINAL  Final  SARS Coronavirus 2 by RT PCR (hospital order, performed in [facility] hospital lab) *cepheid single result test* Anterior Nasal Swab     Status: None   Collection Time: 12/05/21 12:30 AM   Specimen: Anterior Nasal Swab  Result Value Ref Range Status   SARS Coronavirus 2 by RT PCR NEGATIVE NEGATIVE Final    Comment: (NOTE) SARS-CoV-2 target nucleic acids are NOT DETECTED.  The SARS-CoV-2 RNA is generally detectable in upper and lower respiratory specimens during the acute phase of infection. The lowest concentration of SARS-CoV-2 viral copies this assay can detect is 250 copies / mL. A negative result does not preclude SARS-CoV-2 infection and should not be used as the sole basis for treatment or other patient management decisions.  A negative result may occur with improper specimen collection / handling, submission of specimen other than nasopharyngeal swab, presence of viral mutation(s) within the areas targeted by this assay, and inadequate number of viral copies (<250 copies / mL). A negative result must be combined with clinical observations, patient history, and epidemiological information.  Fact Sheet for Patients:   RoadLapTop.co.za  Fact Sheet for Healthcare Providers: http://kim-miller.com/  This test is not yet approved or  cleared by the [place] FDA and has been authorized for detection and/or diagnosis of SARS-CoV-2 by FDA under an Emergency Use Authorization (EUA).  This EUA will remain in effect (meaning this test can be used) for the duration of the COVID-19 declaration under Section 564(b)(1) of the Act, 21 U.S.C. section 360bbb-3(b)(1), unless the authorization is terminated or revoked sooner.  Performed at [facility] Lab, [address] [place], [place] [address]   Culture, blood (Routine X 2) w Reflex to ID Panel      Status: None   Collection Time: 12/05/21  1:35 AM   Specimen: BLOOD RIGHT HAND  Result Value Ref Range Status   Specimen Description BLOOD RIGHT HAND  Final   Special Requests   Final    BOTTLES DRAWN AEROBIC AND ANAEROBIC Blood Culture adequate volume   Culture   Final    NO GROWTH 5 DAYS Performed at [facility] Lab, [address] [place], [place] [address]    Report Status 12/10/2021 FINAL  Final  MRSA Next Gen by PCR, Nasal     Status: None   Collection Time: 12/05/21  2:49 PM   Specimen: Nasal Mucosa; Nasal Swab  Result Value Ref Range Status   MRSA by PCR Next Gen NOT DETECTED NOT DETECTED Final    Comment: (NOTE) The GeneXpert MRSA Assay (FDA approved for NASAL specimens only), is one component of a comprehensive MRSA colonization surveillance program. It is not intended to diagnose MRSA infection nor to guide or monitor treatment for MRSA infections. Test performance is not FDA approved in patients less than 75 years old. Performed at [facility], [address], [place], [place] [address]     RADIOLOGY STUDIES/RESULTS: No results found.   LOS: 6 days   Jeoffrey Massed, MD  [facility]    To contact the attending provider between 7A-7P or the covering provider during after hours 7P-7A, please log into the web site [email or web address] and access using universal [place] password for that web site. If you do not have the password, please call the hospital operator.  12/10/2021, 10:50 AM

## 2021-12-11 DIAGNOSIS — B179 Acute viral hepatitis, unspecified: Secondary | ICD-10-CM | POA: Diagnosis not present

## 2021-12-11 LAB — COMPREHENSIVE METABOLIC PANEL
ALT: 399 U/L — ABNORMAL HIGH (ref 0–44)
AST: 395 U/L — ABNORMAL HIGH (ref 15–41)
Albumin: 1.8 g/dL — ABNORMAL LOW (ref 3.5–5.0)
Alkaline Phosphatase: 124 U/L (ref 38–126)
Anion gap: 7 (ref 5–15)
BUN: 48 mg/dL — ABNORMAL HIGH (ref 6–20)
CO2: 25 mmol/L (ref 22–32)
Calcium: 7.8 mg/dL — ABNORMAL LOW (ref 8.9–10.3)
Chloride: 104 mmol/L (ref 98–111)
Creatinine, Ser: 1.71 mg/dL — ABNORMAL HIGH (ref 0.44–1.00)
GFR, Estimated: 35 mL/min — ABNORMAL LOW (ref >60)
Glucose, Bld: 109 mg/dL — ABNORMAL HIGH (ref 70–99)
Potassium: 3.3 mmol/L — ABNORMAL LOW (ref 3.5–5.1)
Sodium: 136 mmol/L (ref 135–145)
Total Bilirubin: 1.6 mg/dL — ABNORMAL HIGH (ref 0.3–1.2)
Total Protein: 6.2 g/dL — ABNORMAL LOW (ref 6.5–8.1)

## 2021-12-11 LAB — CBC
HCT: 25 % — ABNORMAL LOW (ref 36.0–46.0)
HCT: 24.7 % — ABNORMAL LOW (ref 36.0–46.0)
Hemoglobin: 8.5 g/dL — ABNORMAL LOW (ref 12.0–15.0)
Hemoglobin: 8.2 g/dL — ABNORMAL LOW (ref 12.0–15.0)
MCH: 28.4 pg (ref 26.0–34.0)
MCH: 27.9 pg (ref 26.0–34.0)
MCHC: 34 g/dL (ref 30.0–36.0)
MCHC: 33.2 g/dL (ref 30.0–36.0)
MCV: 83.6 fL (ref 80.0–100.0)
MCV: 84 fL (ref 80.0–100.0)
Platelets: 27 10*3/uL — CL (ref 150–400)
Platelets: 39 10*3/uL — ABNORMAL LOW (ref 150–400)
RBC: 2.99 MIL/uL — ABNORMAL LOW (ref 3.87–5.11)
RBC: 2.94 MIL/uL — ABNORMAL LOW (ref 3.87–5.11)
RDW: 14.3 % (ref 11.5–15.5)
RDW: 14.5 % (ref 11.5–15.5)
WBC: 1.5 10*3/uL — ABNORMAL LOW (ref 4.0–10.5)
WBC: 1.6 10*3/uL — ABNORMAL LOW (ref 4.0–10.5)
nRBC: 0 % (ref 0.0–0.2)
nRBC: 0 % (ref 0.0–0.2)

## 2021-12-11 LAB — MAGNESIUM: Magnesium: 1.8 mg/dL (ref 1.7–2.4)

## 2021-12-11 LAB — PHOSPHORUS: Phosphorus: 4.4 mg/dL (ref 2.5–4.6)

## 2021-12-11 MED ORDER — POTASSIUM CHLORIDE 20 MEQ PO PACK
40.0000 meq | PACK | ORAL | Status: AC
Start: 2021-12-11 — End: 2021-12-11
  Administered 2021-12-11 (×2): 40 meq via ORAL
  Filled 2021-12-11 (×2): qty 2

## 2021-12-11 MED ORDER — METOPROLOL TARTRATE 25 MG PO TABS
25.0000 mg | ORAL_TABLET | Freq: Two times a day (BID) | ORAL | Status: DC
  Administered 2021-12-11 – 2021-12-12 (×3): 25 mg via ORAL
  Filled 2021-12-11 (×3): qty 1

## 2021-12-11 NOTE — Progress Notes (Signed)
PROGRESS NOTE        PATIENT DETAILS Name: Megan Reid Age: 56 y.o. Sex: female Date of Birth: [birth year] Admit Date: 12/04/2021 Admitting Physician Charlotte Sanes, MD [id number], Soyla Murphy, MD  Brief Summary: Patient is a 56 y.o.  female with history of Sjogren syndrome-recent right facial pain-started on Tegretol-brought to the ED for lethargy, generalized body pain, nonbloody diarrhea-patient was found to have acute metabolic encephalopathy in the setting of acute hepatitis, AKI, pancytopenia-initially admitted by [facility]-but deteriorated and subsequently transferred to the ICU.  Significant events: 8/20>> admit to [facility]-encephalopathy/AKI/acute hepatitis/fever-?  Side effect of carbamazepine. 8/21>> transferred to ICU. 8/24>> transfer back to [facility].  Significant studies: 8/20>> CXR: No PNA 8/20>> abdominal ultrasound: Fatty liver, no hydronephrosis. 8/20>>Acute hepatitis serology: Negative 8/20>> Tegretol level: 4.3 (normal) 8/21>> Tylenol level:<10 8/21>> antimitochondrial /anti-smooth muscle/anti microsomal antibody: Negative 8/21>> Ehrlichia antibody panel: Negative 8/21>> Lyme Antibody: Negative 8/21>> RMSF titers: Negative 8/21>> CMV titers: Negative 8/21>> EBV PCR: Negative 8/21>> ANA: Positive, dsDNA negative 8/21>> CT chest/abdomen/pelvis: No acute abnormality identified. 8/21>> Echo: EF 60-65%  Significant microbiology data: 8/21>> blood culture: No growth 8/21>> COVID PCR: Negative.  Procedures: None  Consults: GI PCCM Nephrology  Subjective:  Patient in bed, appears comfortable, denies any headache, no fever, no chest pain or pressure, no shortness of breath , no abdominal pain. No new focal weakness.   Objective: Vitals: Blood pressure 133/86, pulse 96, temperature 98.9 F (37.2 C), temperature source Oral, resp. rate (!) 23, height 5\' 9"  (1.753 m), weight 76.1 kg, last menstrual period 05/22/2012, SpO2 99 %.    Exam:  Awake Alert, No new F.N deficits, Normal affect [place].AT,PERRAL Supple Neck, No JVD,   Symmetrical Chest wall movement, Good air movement bilaterally, CTAB RRR,No Gallops, Rubs or new Murmurs,  +ve B.Sounds, Abd Soft, No tenderness,   No Cyanosis, Clubbing or edema    Assessment/Plan:  Acute hepatitis/pancytopenia: Felt to be due to carbamazepine-LFTs continue to downtrend-platelet count slowly improving.  Hb/WBC low but stable.   Continue supportive care.  GI has signed off.  Dr 07/20/2012 discussed/chart reviewed with Dr. Jerral Ralph on 8/24-recommends supportive care-with recommendations to transfuse if bleeding or platelet count <10  AKI: Likely hemodynamically mediated-possibly ATN-in the setting of acute illness/diarrhea/use of ibuprofen.  UA with proteinuria.  Renal function gradually improving-nephrology has signed off, post discharged outpatient nephrology follow-up.  Hypokalemia, hypomagnesemia, hypophosphatemia.  Replaced.  Acute metabolic encephalopathy: In the setting of hepatitis/AKI-better but still somewhat slow-continue supportive care.  SIRS: Afebrile x2 days now-suspect this is noninfectious-likely related to hepatitis/carbamazepine side effect.  Initially on cefepime-but narrowed down to Rocephin-with plans to complete 7 days of empiric antibiotics.   History of Sjogren syndrome with trigeminal neuralgia: Supportive care-would not rechallenge with Tegretol in the future.  Will need to follow-up with primary neurologist postdischarge.  Currently without any facial pain.  History of asthma: Not in exacerbation-continue bronchodilators.  HTN:  add Lopressor- HCTZ remains on hold given AKI/hypokalemia.  BMI: Estimated body mass index is 24.78 kg/m as calculated from the following:   Height as of this encounter: 5\' 9"  (1.753 m).   Weight as of this encounter: 76.1 kg.   Code status:   Code Status: Full Code   DVT Prophylaxis: Place and maintain sequential  compression device Start: 12/06/21 0759 SCDs Start: 12/05/21 0140 SCDs Start: 12/04/21 2125   Family Communication: Sister/daughter  at bedside.  Disposition Plan: Status is: Inpatient Remains inpatient appropriate because: Severe [place]-improving AKI/hepatitis/pancytopenia-CIR probably early next week.   Planned Discharge Destination:CIR   Diet: Diet Order             Diet regular Room service appropriate? Yes; Fluid consistency: Thin  Diet effective now                   MEDICATIONS: Scheduled Meds:  Chlorhexidine Gluconate Cloth  6 each Topical Daily   feeding supplement  237 mL Oral BID BM   fluticasone  2 spray Each Nare Daily   montelukast  10 mg Oral QHS   multivitamin with minerals  1 tablet Oral Daily   pantoprazole  40 mg Oral Daily   potassium chloride  40 mEq Oral Q4H   Continuous Infusions:  PRN Meds:.docusate sodium, HYDROmorphone (DILAUDID) injection, menthol-cetylpyridinium, ondansetron (ZOFRAN) IV, mouth rinse, polyethylene glycol   I have personally reviewed following labs and imaging studies  LABORATORY DATA:  Recent Labs  Lab 12/04/21 1339 12/05/21 0130 12/06/21 0111 12/06/21 1137 12/07/21 0131 12/08/21 1643 12/09/21 0525 12/09/21 1638 12/10/21 0618 12/11/21 0334  WBC 1.8* 1.8*   < > 2.9*   < > 2.4* 1.9* 1.8* 1.5* 1.6*  HGB 13.6 11.0*   < > 11.9*   < > 9.8* 9.3* 9.9* 8.5* 8.2*  HCT 40.5 32.2*   < > 33.1*   < > 28.5* 27.9* 30.3* 25.0* 24.7*  PLT 41* 24*   < > 19*   < > 12* 14* 20* 27* 39*  MCV 85.1 83.6   < > 79.2*   < > 82.8 83.8 85.4 83.6 84.0  MCH 28.6 28.6   < > 28.5   < > 28.5 27.9 27.9 28.4 27.9  MCHC 33.6 34.2   < > 36.0   < > 34.4 33.3 32.7 34.0 33.2  RDW 14.5 14.5   < > 14.0   < > 13.4 14.0 14.2 14.3 14.5  LYMPHSABS 0.4* 0.4*  --  0.5*  --   --   --   --   --   --   MONOABS 0.1 0.1  --  0.0*  --   --   --   --   --   --   EOSABS 0.0 0.0  --  0.0  --   --   --   --   --   --   BASOSABS 0.0 0.0  --  0.0  --   --   --   --    --   --    < > = values in this interval not displayed.    Recent Labs  Lab 12/04/21 1950 12/05/21 0130 12/05/21 0232 12/05/21 0252 12/05/21 0518 12/05/21 2232 12/05/21 2232 12/06/21 0111 12/06/21 1137 12/07/21 0131 12/07/21 1240 12/08/21 0047 12/09/21 0525 12/09/21 1638 12/10/21 0618 12/11/21 0334  NA  --   --   --    < >  --  137  --  140   < > 143 141 138 139 137 137 136  K  --   --   --    < >  --  3.0*  --  3.0*   < > 3.0* 2.9* 2.3* 2.4* 3.5 3.3* 3.3*  CL  --   --   --    < >  --  110  --  107   < > 101 97* 95* 100 101 105 104  CO2  --   --   --    < >  --  11*  --  12*   < > 20* 22 25 28 26 25 25   GLUCOSE  --   --   --    < >  --  137*  --  153*   < > 102* 96 132* 95 125* 108* 109*  BUN  --   --   --    < >  --  87*  --  95*   < > 90* 88* 80* 63* 54* 55* 48*  CREATININE  --   --   --    < >  --  4.03*  --  4.30*   < > 3.90* 3.66* 3.47* 2.53* 2.26* 2.01* 1.71*  CALCIUM  --   --   --    < >  --  6.6*  --  7.1*   < > 7.3* 7.2* 6.6* 7.1* 7.4* 7.7* 7.8*  AST  --   --   --    < >  --  6,541*  --  *  --  2,866* 2,611* 1,805* 1,011*  --  615* 395*  ALT  --   --   --    < >  --  1,894*  --  2,031*  --  1,607* 1,328* 1,018* 702*  --  539* 399*  ALKPHOS  --   --   --    < >  --  61  --  63  --  83 101 101 116  --  122 124  BILITOT  --   --   --    < >  --  2.0*  --  2.6*  --  4.0* 5.1* 4.9* 3.5*  --  2.2* 1.6*  ALBUMIN  --   --   --    < >  --  1.8*  --  1.9*  --  1.9* 1.8* 1.6* 1.8*  --  1.8* 1.8*  MG  --   --   --   --   --   --    < > 2.3  --  2.0  --  1.6* 1.7  --  2.2 1.8  PHOS  --   --   --   --   --   --    < > 4.5  --  2.3*  --  3.0 3.5  --  2.3* 4.4  PROCALCITON  --  18.09  --   --   --   --   --  18.61  --  12.88  --   --   --   --   --   --   LATICACIDVEN  --   --  2.6*  --  2.2*  --   --   --   --   --   --   --   --   --   --   --   INR 1.4*  --   --   --   --  2.7*  --   --   --  1.3*  --  1.2  --   --   --   --   AMMONIA 36*  --   --   --   --   --   --   --    --   --  53* 37*  --   --   --   --    < > = values in this interval not displayed.  RADIOLOGY STUDIES/RESULTS: No results found.   LOS: 7 days   Signature  Lala Lund M.D on 12/11/2021 at 10:24 AM   -  To page go to [email or web address]

## 2021-12-12 ENCOUNTER — Other Ambulatory Visit: Payer: Self-pay

## 2021-12-12 ENCOUNTER — Inpatient Hospital Stay (HOSPITAL_COMMUNITY)
Admission: RE | Admit: 2021-12-12 | Discharge: 2021-12-18 | DRG: 091 | Disposition: A | Discharge status: Home / Self Care | Payer: BC Managed Care – PPO | Source: Intra-hospital | Attending: Physical Medicine & Rehabilitation | Admitting: Physical Medicine & Rehabilitation

## 2021-12-12 ENCOUNTER — Encounter (HOSPITAL_COMMUNITY): Payer: Self-pay | Admitting: Physical Medicine & Rehabilitation

## 2021-12-12 DIAGNOSIS — B179 Acute viral hepatitis, unspecified: Secondary | ICD-10-CM | POA: Diagnosis not present

## 2021-12-12 DIAGNOSIS — Z832 Family history of diseases of the blood and blood-forming organs and certain disorders involving the immune mechanism: Secondary | ICD-10-CM | POA: Diagnosis not present

## 2021-12-12 DIAGNOSIS — Z888 Allergy status to other drugs, medicaments and biological substances status: Secondary | ICD-10-CM | POA: Diagnosis not present

## 2021-12-12 DIAGNOSIS — Z79899 Other long term (current) drug therapy: Secondary | ICD-10-CM

## 2021-12-12 DIAGNOSIS — N170 Acute kidney failure with tubular necrosis: Secondary | ICD-10-CM | POA: Diagnosis present

## 2021-12-12 DIAGNOSIS — R053 Chronic cough: Secondary | ICD-10-CM | POA: Diagnosis present

## 2021-12-12 DIAGNOSIS — R5381 Other malaise: Secondary | ICD-10-CM | POA: Diagnosis present

## 2021-12-12 DIAGNOSIS — Z841 Family history of disorders of kidney and ureter: Secondary | ICD-10-CM

## 2021-12-12 DIAGNOSIS — R7989 Other specified abnormal findings of blood chemistry: Secondary | ICD-10-CM | POA: Diagnosis present

## 2021-12-12 DIAGNOSIS — F419 Anxiety disorder, unspecified: Secondary | ICD-10-CM | POA: Diagnosis present

## 2021-12-12 DIAGNOSIS — D72819 Decreased white blood cell count, unspecified: Secondary | ICD-10-CM | POA: Diagnosis present

## 2021-12-12 DIAGNOSIS — D61818 Other pancytopenia: Secondary | ICD-10-CM | POA: Diagnosis present

## 2021-12-12 DIAGNOSIS — Y929 Unspecified place or not applicable: Secondary | ICD-10-CM | POA: Diagnosis not present

## 2021-12-12 DIAGNOSIS — Z8249 Family history of ischemic heart disease and other diseases of the circulatory system: Secondary | ICD-10-CM

## 2021-12-12 DIAGNOSIS — E559 Vitamin D deficiency, unspecified: Secondary | ICD-10-CM | POA: Diagnosis present

## 2021-12-12 DIAGNOSIS — Z825 Family history of asthma and other chronic lower respiratory diseases: Secondary | ICD-10-CM

## 2021-12-12 DIAGNOSIS — X58XXXD Exposure to other specified factors, subsequent encounter: Secondary | ICD-10-CM | POA: Diagnosis present

## 2021-12-12 DIAGNOSIS — G928 Other toxic encephalopathy: Secondary | ICD-10-CM | POA: Diagnosis present

## 2021-12-12 DIAGNOSIS — Z882 Allergy status to sulfonamides status: Secondary | ICD-10-CM

## 2021-12-12 DIAGNOSIS — G4700 Insomnia, unspecified: Secondary | ICD-10-CM | POA: Diagnosis present

## 2021-12-12 DIAGNOSIS — Z7951 Long term (current) use of inhaled steroids: Secondary | ICD-10-CM

## 2021-12-12 DIAGNOSIS — M3500 Sicca syndrome, unspecified: Secondary | ICD-10-CM | POA: Diagnosis present

## 2021-12-12 DIAGNOSIS — I10 Essential (primary) hypertension: Secondary | ICD-10-CM | POA: Diagnosis present

## 2021-12-12 DIAGNOSIS — K5900 Constipation, unspecified: Secondary | ICD-10-CM | POA: Diagnosis present

## 2021-12-12 DIAGNOSIS — R000 Tachycardia, unspecified: Secondary | ICD-10-CM | POA: Diagnosis present

## 2021-12-12 DIAGNOSIS — T421X5D Adverse effect of iminostilbenes, subsequent encounter: Secondary | ICD-10-CM

## 2021-12-12 DIAGNOSIS — J45909 Unspecified asthma, uncomplicated: Secondary | ICD-10-CM | POA: Diagnosis present

## 2021-12-12 DIAGNOSIS — G500 Trigeminal neuralgia: Secondary | ICD-10-CM | POA: Diagnosis present

## 2021-12-12 DIAGNOSIS — Z87891 Personal history of nicotine dependence: Secondary | ICD-10-CM | POA: Diagnosis not present

## 2021-12-12 DIAGNOSIS — D649 Anemia, unspecified: Secondary | ICD-10-CM

## 2021-12-12 DIAGNOSIS — G479 Sleep disorder, unspecified: Secondary | ICD-10-CM | POA: Diagnosis present

## 2021-12-12 DIAGNOSIS — S40022D Contusion of left upper arm, subsequent encounter: Secondary | ICD-10-CM

## 2021-12-12 DIAGNOSIS — R4189 Other symptoms and signs involving cognitive functions and awareness: Secondary | ICD-10-CM | POA: Diagnosis present

## 2021-12-12 DIAGNOSIS — J302 Other seasonal allergic rhinitis: Secondary | ICD-10-CM | POA: Diagnosis present

## 2021-12-12 LAB — COMPREHENSIVE METABOLIC PANEL
ALT: 313 U/L — ABNORMAL HIGH (ref 0–44)
AST: 240 U/L — ABNORMAL HIGH (ref 15–41)
Albumin: 1.9 g/dL — ABNORMAL LOW (ref 3.5–5.0)
Alkaline Phosphatase: 119 U/L (ref 38–126)
Anion gap: 4 — ABNORMAL LOW (ref 5–15)
BUN: 38 mg/dL — ABNORMAL HIGH (ref 6–20)
CO2: 23 mmol/L (ref 22–32)
Calcium: 8.1 mg/dL — ABNORMAL LOW (ref 8.9–10.3)
Chloride: 110 mmol/L (ref 98–111)
Creatinine, Ser: 1.51 mg/dL — ABNORMAL HIGH (ref 0.44–1.00)
GFR, Estimated: 40 mL/min — ABNORMAL LOW (ref >60)
Glucose, Bld: 99 mg/dL (ref 70–99)
Potassium: 4 mmol/L (ref 3.5–5.1)
Sodium: 137 mmol/L (ref 135–145)
Total Bilirubin: 1.4 mg/dL — ABNORMAL HIGH (ref 0.3–1.2)
Total Protein: 6.2 g/dL — ABNORMAL LOW (ref 6.5–8.1)

## 2021-12-12 LAB — CBC WITH DIFFERENTIAL/PLATELET
Abs Immature Granulocytes: 0.01 10*3/uL (ref 0.00–0.07)
Basophils Absolute: 0 10*3/uL (ref 0.0–0.1)
Basophils Relative: 1 %
Eosinophils Absolute: 0 10*3/uL (ref 0.0–0.5)
Eosinophils Relative: 0 %
HCT: 24.5 % — ABNORMAL LOW (ref 36.0–46.0)
Hemoglobin: 8 g/dL — ABNORMAL LOW (ref 12.0–15.0)
Immature Granulocytes: 1 %
Lymphocytes Relative: 27 %
Lymphs Abs: 0.5 10*3/uL — ABNORMAL LOW (ref 0.7–4.0)
MCH: 28.2 pg (ref 26.0–34.0)
MCHC: 32.7 g/dL (ref 30.0–36.0)
MCV: 86.3 fL (ref 80.0–100.0)
Monocytes Absolute: 0.2 10*3/uL (ref 0.1–1.0)
Monocytes Relative: 8 %
Neutro Abs: 1.1 10*3/uL — ABNORMAL LOW (ref 1.7–7.7)
Neutrophils Relative %: 63 %
Platelets: 62 10*3/uL — ABNORMAL LOW (ref 150–400)
RBC: 2.84 MIL/uL — ABNORMAL LOW (ref 3.87–5.11)
RDW: 14.8 % (ref 11.5–15.5)
WBC: 1.8 10*3/uL — ABNORMAL LOW (ref 4.0–10.5)
nRBC: 0 % (ref 0.0–0.2)

## 2021-12-12 LAB — TSH: TSH: 3.547 u[IU]/mL (ref 0.350–4.500)

## 2021-12-12 MED ORDER — MONTELUKAST SODIUM 10 MG PO TABS
10.0000 mg | ORAL_TABLET | Freq: Every day | ORAL | Status: DC
  Administered 2021-12-12 – 2021-12-17 (×6): 10 mg via ORAL
  Filled 2021-12-12 (×6): qty 1

## 2021-12-12 MED ORDER — TRAZODONE HCL 50 MG PO TABS: 25.0000 mg | ORAL_TABLET | Freq: Every evening | ORAL | Status: DC | PRN

## 2021-12-12 MED ORDER — FLUTICASONE PROPIONATE 50 MCG/ACT NA SUSP
2.0000 | Freq: Every day | NASAL | Status: DC
Start: 2021-12-13 — End: 2021-12-18
  Administered 2021-12-13 – 2021-12-18 (×4): 2 via NASAL
  Filled 2021-12-12: qty 16

## 2021-12-12 MED ORDER — FLEET ENEMA 7-19 GM/118ML RE ENEM
1.0000 | ENEMA | Freq: Once | RECTAL | Status: DC | PRN
Start: 2021-12-12 — End: 2021-12-18

## 2021-12-12 MED ORDER — METOPROLOL TARTRATE 25 MG PO TABS
25.0000 mg | ORAL_TABLET | Freq: Two times a day (BID) | ORAL | Status: DC
  Administered 2021-12-12 – 2021-12-18 (×11): 25 mg via ORAL
  Filled 2021-12-12 (×12): qty 1

## 2021-12-12 MED ORDER — ADULT MULTIVITAMIN W/MINERALS CH
1.0000 | ORAL_TABLET | Freq: Every day | ORAL | Status: DC
  Administered 2021-12-13 – 2021-12-18 (×6): 1 via ORAL
  Filled 2021-12-12 (×6): qty 1

## 2021-12-12 MED ORDER — DIPHENHYDRAMINE HCL 12.5 MG/5ML PO ELIX: 12.5000 mg | ORAL_SOLUTION | Freq: Four times a day (QID) | ORAL | Status: DC | PRN

## 2021-12-12 MED ORDER — PROCHLORPERAZINE MALEATE 5 MG PO TABS: 5.0000 mg | ORAL_TABLET | Freq: Four times a day (QID) | ORAL | Status: DC | PRN

## 2021-12-12 MED ORDER — ENSURE ENLIVE PO LIQD
237.0000 mL | Freq: Two times a day (BID) | ORAL | Status: DC
  Administered 2021-12-12 – 2021-12-17 (×8): 237 mL via ORAL

## 2021-12-12 MED ORDER — ACETAMINOPHEN 325 MG PO TABS
325.0000 mg | ORAL_TABLET | ORAL | Status: DC | PRN
  Filled 2021-12-12: qty 2

## 2021-12-12 MED ORDER — MENTHOL 3 MG MT LOZG: 1.0000 | LOZENGE | OROMUCOSAL | Status: DC | PRN

## 2021-12-12 MED ORDER — MOMETASONE FURO-FORMOTEROL FUM 200-5 MCG/ACT IN AERO
2.0000 | INHALATION_SPRAY | Freq: Two times a day (BID) | RESPIRATORY_TRACT | Status: DC
  Administered 2021-12-12 – 2021-12-18 (×9): 2 via RESPIRATORY_TRACT
  Filled 2021-12-12: qty 8.8

## 2021-12-12 MED ORDER — METOPROLOL TARTRATE 25 MG PO TABS: 25.0000 mg | ORAL_TABLET | Freq: Two times a day (BID) | ORAL | Status: DC

## 2021-12-12 MED ORDER — ALBUTEROL SULFATE HFA 108 (90 BASE) MCG/ACT IN AERS: 2.0000 | INHALATION_SPRAY | Freq: Two times a day (BID) | RESPIRATORY_TRACT | Status: DC

## 2021-12-12 MED ORDER — PROCHLORPERAZINE EDISYLATE 10 MG/2ML IJ SOLN: 5.0000 mg | Freq: Four times a day (QID) | INTRAMUSCULAR | Status: DC | PRN

## 2021-12-12 MED ORDER — BISACODYL 10 MG RE SUPP: 10.0000 mg | Freq: Every day | RECTAL | Status: DC | PRN

## 2021-12-12 MED ORDER — ORAL CARE MOUTH RINSE: 15.0000 mL | OROMUCOSAL | Status: DC | PRN

## 2021-12-12 MED ORDER — PNEUMOCOCCAL 20-VAL CONJ VACC 0.5 ML IM SUSY
0.5000 mL | PREFILLED_SYRINGE | INTRAMUSCULAR | Status: DC
  Filled 2021-12-12: qty 0.5

## 2021-12-12 MED ORDER — DOCUSATE SODIUM 100 MG PO CAPS: 100.0000 mg | ORAL_CAPSULE | Freq: Two times a day (BID) | ORAL | Status: DC | PRN

## 2021-12-12 MED ORDER — PROCHLORPERAZINE 25 MG RE SUPP: 12.5000 mg | Freq: Four times a day (QID) | RECTAL | Status: DC | PRN

## 2021-12-12 MED ORDER — POLYETHYLENE GLYCOL 3350 17 G PO PACK
17.0000 g | PACK | ORAL | Status: DC
  Administered 2021-12-13 – 2021-12-17 (×3): 17 g via ORAL
  Filled 2021-12-12 (×2): qty 1

## 2021-12-12 MED ORDER — ALBUTEROL SULFATE (2.5 MG/3ML) 0.083% IN NEBU
2.5000 mg | INHALATION_SOLUTION | Freq: Two times a day (BID) | RESPIRATORY_TRACT | Status: DC
  Administered 2021-12-12 – 2021-12-13 (×2): 2.5 mg via RESPIRATORY_TRACT
  Filled 2021-12-12 (×4): qty 3

## 2021-12-12 MED ORDER — PANTOPRAZOLE SODIUM 40 MG PO TBEC
40.0000 mg | DELAYED_RELEASE_TABLET | Freq: Every day | ORAL | Status: DC
  Administered 2021-12-13 – 2021-12-18 (×6): 40 mg via ORAL
  Filled 2021-12-12 (×6): qty 1

## 2021-12-12 MED ORDER — POLYETHYLENE GLYCOL 3350 17 G PO PACK: 17.0000 g | PACK | Freq: Every day | ORAL | 0 refills | Status: DC | PRN

## 2021-12-12 MED ORDER — ALUM & MAG HYDROXIDE-SIMETH 200-200-20 MG/5ML PO SUSP: 30.0000 mL | ORAL | Status: DC | PRN

## 2021-12-12 MED ORDER — GUAIFENESIN-DM 100-10 MG/5ML PO SYRP: 5.0000 mL | ORAL_SOLUTION | Freq: Four times a day (QID) | ORAL | Status: DC | PRN

## 2021-12-12 NOTE — Discharge Summary (Signed)
Megan Reid SAY:[id number] DOB: 10/20/1965 DOA: 12/04/2021  PCP: [name], MD  Admit date: 12/04/2021  Discharge date: 12/12/2021  Admitted From: Home   Disposition:  CIR   Recommendations for Outpatient Follow-up:   Follow up with PCP in 1-2 weeks  PCP Please obtain BMP/CBC, 2 view CXR in 1week,  (see Discharge instructions)   PCP Please follow up on the following pending results:    Home Health: None   Equipment/Devices: None  Consultations: GI, PCCM, Nephrology Discharge Condition: Stable    CODE STATUS: Full    Diet Recommendation: Heart Healthy     Chief Complaint  Patient presents with   Abdominal Pain     Brief history of present illness from the day of admission and additional interim summary     56 y.o.  female with history of Sjogren syndrome-recent right facial pain-started on Tegretol-brought to the ED for lethargy, generalized body pain, nonbloody diarrhea-patient was found to have acute metabolic encephalopathy in the setting of acute hepatitis, AKI, pancytopenia-initially admitted by [facility]-but deteriorated and subsequently transferred to the ICU.   Significant events: 8/20>> admit to [facility]-encephalopathy/AKI/acute hepatitis/fever-?  Side effect of carbamazepine. 8/21>> transferred to ICU. 8/24>> transfer back to [facility].   Significant studies: 8/20>> CXR: No PNA 8/20>> abdominal ultrasound: Fatty liver, no hydronephrosis. 8/20>>Acute hepatitis serology: Negative 8/20>> Tegretol level: 4.3 (normal) 8/21>> Tylenol level:<10 8/21>> antimitochondrial /anti-smooth muscle/anti microsomal antibody: Negative 8/21>> Ehrlichia antibody panel: Negative 8/21>> Lyme Antibody: Negative 8/21>> RMSF titers: Negative 8/21>> CMV titers: Negative 8/21>> EBV PCR: Negative 8/21>> ANA: Positive,  dsDNA negative 8/21>> CT chest/abdomen/pelvis: No acute abnormality identified. 8/21>> Echo: EF 60-65%   Significant microbiology data: 8/21>> blood culture: No growth 8/21>> COVID PCR: Negative.   Procedures: None   Consults: GI PCCM Nephrology                                                                 Hospital Course     Acute hepatitis/pancytopenia: Felt to be due to carbamazepine-LFTs continue to downtrend-platelet count slowly improving.  Hb/WBC low but stable.   Continue supportive care.  GI has signed off.  Dr Jerral Ralph discussed/chart reviewed with Dr. Clelia Croft on 8/24-recommends supportive care-with recommendations to transfuse if bleeding or platelet count <10.  Overall clinically improving medically ready for discharge await SNF/CIR bed.   AKI: Likely hemodynamically mediated-possibly ATN-in the setting of acute illness/diarrhea/use of ibuprofen.  UA with proteinuria.  Renal function gradually improving-nephrology has signed off, post discharged outpatient nephrology follow-up.   Hypokalemia, hypomagnesemia, hypophosphatemia.  Replaced.   Acute metabolic encephalopathy: In the setting of hepatitis/AKI-better but still somewhat slow-continue supportive care.   SIRS: Afebrile x2 days now-suspect this is noninfectious-likely related to hepatitis/carbamazepine side effect.  Initially on cefepime-but narrowed down to Rocephin-with plans to complete 7 days of  empiric antibiotics.    History of Sjogren syndrome with trigeminal neuralgia: Supportive care-would not rechallenge with Tegretol in the future.  Will need to follow-up with primary neurologist postdischarge.  Currently without any facial pain.   History of asthma: Not in exacerbation-continue bronchodilators.   HTN:  added Lopressor- HCTZ remains on hold given AKI/hypokalemia.   Mild intermittent sinus tachycardia. Stable TSH, suportive Rx.  Discharge diagnosis     Principal Problem:   Acute hepatitis Active  Problems:   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HCC)   Leucopenia   Sjogren's syndrome (HCC)   Thrombocytopenia (HCC)   Hepatitis    Discharge instructions    Discharge Instructions     Diet - low sodium heart healthy   Complete by: As directed    Discharge instructions   Complete by: As directed    Follow with Primary MD Leeroy Cha, MD in 7 days   Get CBC, CMP, 2 view Chest X ray -  checked next visit within 1 week by Primary MD  Activity: As tolerated with Full fall precautions use walker/cane & assistance as needed  Disposition CIR  Diet: Heart Healthy   Special Instructions: If you have smoked or chewed Tobacco  in the last 2 yrs please stop smoking, stop any regular Alcohol  and or any Recreational drug use.  On your next visit with your primary care physician please Get Medicines reviewed and adjusted.  Please request your Prim.MD to go over all Hospital Tests and Procedure/Radiological results at the follow up, please get all Hospital records sent to your Prim MD by signing hospital release before you go home.  If you experience worsening of your admission symptoms, develop shortness of breath, life threatening emergency, suicidal or homicidal thoughts you must seek medical attention immediately by calling 911 or calling your MD immediately  if symptoms less severe.  You Must read complete instructions/literature along with all the possible adverse reactions/side effects for all the Medicines you take and that have been prescribed to you. Take any new Medicines after you have completely understood and accpet all the possible adverse reactions/side effects.   Increase activity slowly   Complete by: As directed        Discharge Medications   Allergies as of 12/12/2021       Reactions   Sulfa Antibiotics Anaphylaxis, Hives, Swelling   Swelling of the face and tongue    Tegretol [carbamazepine] Other (See Comments)   Suspected  hepatotoxicity and pancytopenia, required inpatient admission 11/2021        Medication List     STOP taking these medications    carbamazepine 200 MG tablet Commonly known as: TEGRETOL   docusate sodium 100 MG capsule Commonly known as: COLACE   fluticasone 50 MCG/ACT nasal spray Commonly known as: FLONASE   glycerin adult 2 g suppository   hydrochlorothiazide 25 MG tablet Commonly known as: HYDRODIURIL   omeprazole 20 MG capsule Commonly known as: PRILOSEC   ondansetron 4 MG disintegrating tablet Commonly known as: ZOFRAN-ODT   polyethylene glycol powder 17 GM/SCOOP powder Commonly known as: GLYCOLAX/MIRALAX Replaced by: polyethylene glycol 17 g packet   valACYclovir 1000 MG tablet Commonly known as: VALTREX       TAKE these medications    AeroChamber Plus inhaler Use as instructed   albuterol 108 (90 Base) MCG/ACT inhaler Commonly known as: VENTOLIN HFA Inhale 2 puffs into the lungs 2 (two) times daily. For shortness of breath   metoprolol tartrate 25 MG tablet  Commonly known as: LOPRESSOR Take 1 tablet (25 mg total) by mouth 2 (two) times daily.   montelukast 10 MG tablet Commonly known as: Singulair Take 1 tablet (10 mg total) by mouth at bedtime.   pantoprazole 40 MG tablet Commonly known as: Protonix Take 1 tablet (40 mg total) by mouth daily.   polyethylene glycol 17 g packet Commonly known as: MIRALAX / GLYCOLAX Take 17 g by mouth daily as needed for moderate constipation. Replaces: polyethylene glycol powder 17 GM/SCOOP powder   rosuvastatin 20 MG tablet Commonly known as: CRESTOR Take 20 mg by mouth daily.   Symbicort 160-4.5 MCG/ACT inhaler Generic drug: budesonide-formoterol INHALE 2 PUFFS INTO THE LUNGS TWICE DAILY What changed: when to take this   Xolair 75 MG/0.5ML prefilled syringe Generic drug: omalizumab INJECT 1 SYRINGE UNDER THE SKIN EVERY 2 WEEKS. TOTAL DOSE = 375MG  What changed: See the new instructions.   Xolair  150 MG/ML prefilled syringe Generic drug: omalizumab INJECT 2 SYRINGES UNDER THE SKIN EVERY 2 WEEKS. TOTAL DOSE =375MG  What changed: See the new instructions.         Follow-up Information     Rexene Agent, MD Follow up in 2 week(s).   Specialty: Nephrology Why: we will call with appt details Contact information: [place] [address] [phone]         Leeroy Cha, MD. Schedule an appointment as soon as possible for a visit in 1 week(s).   Specialty: Internal Medicine Contact information: [address] [place] [address] [phone]                 Major procedures and Radiology Reports - PLEASE review detailed and final reports thoroughly  -       ECHOCARDIOGRAM COMPLETE  Result Date: 12/05/2021    ECHOCARDIOGRAM REPORT   Patient Name:   ALEATHIA BRADIGAN Biermann Date of Exam: 12/05/2021 Medical Rec #:  [id number]       Height:       69.0 in Accession #:    [id number]      Weight:       166.0 lb Date of Birth:  23-May-1965        BSA:          1.909 m Patient Age:    27 years        BP:           132/84 mmHg Patient Gender: F               HR:           78 bpm. Exam Location:  Inpatient Procedure: 2D Echo, Cardiac Doppler and Color Doppler Indications:    Hypertrophic cardiomyopathy  History:        Patient has no prior history of Echocardiogram examinations.  Sonographer:    Jefferey Pica Referring Phys: [id number] [place]  1. No SAM moderate basal septal hypertrophy 15 mm . Left ventricular ejection fraction, by estimation, is 60 to 65%. The left ventricle has normal function. The left ventricle has no regional wall motion abnormalities. Left ventricular diastolic parameters were normal.  2. Right ventricular systolic function is normal. The right ventricular size is normal.  3. The mitral valve is abnormal. Mild mitral valve regurgitation. No evidence of mitral stenosis.  4. The aortic valve is tricuspid. Aortic  valve regurgitation is not visualized. No aortic stenosis is present.  5. Aortic dilatation noted. There is mild dilatation of the ascending  aorta, measuring 38 mm.  6. The inferior vena cava is normal in size with greater than 50% respiratory variability, suggesting right atrial pressure of 3 mmHg. FINDINGS  Left Ventricle: No SAM moderate basal septal hypertrophy 15 mm. Left ventricular ejection fraction, by estimation, is 60 to 65%. The left ventricle has normal function. The left ventricle has no regional wall motion abnormalities. The left ventricular internal cavity size was normal in size. There is no left ventricular hypertrophy. Left ventricular diastolic parameters were normal. Right Ventricle: The right ventricular size is normal. No increase in right ventricular wall thickness. Right ventricular systolic function is normal. Left Atrium: Left atrial size was normal in size. Right Atrium: Right atrial size was normal in size. Pericardium: There is no evidence of pericardial effusion. Mitral Valve: The mitral valve is abnormal. There is mild thickening of the mitral valve leaflet(s). Mild mitral valve regurgitation. No evidence of mitral valve stenosis. Tricuspid Valve: The tricuspid valve is normal in structure. Tricuspid valve regurgitation is mild . No evidence of tricuspid stenosis. Aortic Valve: The aortic valve is tricuspid. Aortic valve regurgitation is not visualized. No aortic stenosis is present. Aortic valve peak gradient measures 5.6 mmHg. Pulmonic Valve: The pulmonic valve was normal in structure. Pulmonic valve regurgitation is not visualized. No evidence of pulmonic stenosis. Aorta: The ascending aorta was not well visualized and aortic dilatation noted. There is mild dilatation of the ascending aorta, measuring 38 mm. Venous: The inferior vena cava is normal in size with greater than 50% respiratory variability, suggesting right atrial pressure of 3 mmHg. IAS/Shunts: No atrial level shunt  detected by color flow Doppler.  LEFT VENTRICLE PLAX 2D LVIDd:         2.26 cm   Diastology LVIDs:         3.03 cm   LV e' medial:    7.40 cm/s LV PW:         0.46 cm   LV E/e' medial:  8.9 LV IVS:        1.23 cm   LV e' lateral:   13.40 cm/s LVOT diam:     2.10 cm   LV E/e' lateral: 4.9 LV SV:         79 LV SV Index:   41 LVOT Area:     3.46 cm  RIGHT VENTRICLE            IVC RV Basal diam:  3.30 cm    IVC diam: 2.20 cm RV S prime:     9.48 cm/s TAPSE (M-mode): 2.1 cm LEFT ATRIUM             Index        RIGHT ATRIUM           Index LA diam:        3.40 cm 1.78 cm/m   RA Area:     13.70 cm LA Vol (A2C):   61.6 ml 32.27 ml/m  RA Volume:   33.60 ml  17.60 ml/m LA Vol (A4C):   36.8 ml 19.28 ml/m LA Biplane Vol: 50.7 ml 26.56 ml/m  AORTIC VALVE                 PULMONIC VALVE AV Area (Vmax): 3.64 cm     PV Vmax:       0.87 m/s AV Vmax:        118.00 cm/s  PV Peak grad:  3.0 mmHg AV Peak Grad:   5.6 mmHg LVOT Vmax:  124.00 cm/s LVOT Vmean:     64.600 cm/s LVOT VTI:       0.227 m  AORTA Ao Root diam: 3.40 cm Ao Asc diam:  3.80 cm MITRAL VALVE MV Area (PHT): 3.65 cm    SHUNTS MV Decel Time: 208 msec    Systemic VTI:  0.23 m MV E velocity: 65.50 cm/s  Systemic Diam: 2.10 cm MV A velocity: 80.20 cm/s MV E/A ratio:  0.82 Jenkins Rouge MD Electronically signed by Jenkins Rouge MD Signature Date/Time: 12/05/2021/10:34:14 AM    Final    CT CHEST ABDOMEN PELVIS WO CONTRAST  Result Date: 12/05/2021 CLINICAL DATA:  Altered mental status, sepsis EXAM: CT CHEST, ABDOMEN AND PELVIS WITHOUT CONTRAST TECHNIQUE: Multidetector CT imaging of the chest, abdomen and pelvis was performed following the standard protocol without IV contrast. RADIATION DOSE REDUCTION: This exam was performed according to the departmental dose-optimization program which includes automated exposure control, adjustment of the mA and/or kV according to patient size and/or use of iterative reconstruction technique. COMPARISON:  CT abdomen pelvis  07/15/2021, CT chest 03/17/2021 FINDINGS: CT CHEST FINDINGS Cardiovascular: No significant coronary artery calcification. Global cardiac size within normal limits. No pericardial effusion. Central pulmonary arteries are of normal caliber. Mild atherosclerotic calcification within the thoracic aorta. No aortic aneurysm. Mediastinum/Nodes: No enlarged mediastinal, hilar, or axillary lymph nodes. Thyroid gland, trachea, and esophagus demonstrate no significant findings. Lungs/Pleura: Previously noted left lower lobe subpleural pulmonary nodule has resolved in keeping with an infectious or inflammatory process. Lungs are clear. No pneumothorax or pleural effusion. Bronchial wall thickening is present in keeping with airway inflammation. No central obstructing lesion. Musculoskeletal: No acute bone abnormality. No lytic or blastic bone lesion. CT ABDOMEN PELVIS FINDINGS Hepatobiliary: Layering hyperdense material within the gallbladder lumen represents probable inspissated sludge or tiny stones. No pericholecystic inflammatory change. The liver is unremarkable on this noncontrast examination. No intra or extrahepatic biliary ductal dilation. Pancreas: Unremarkable Spleen: Unremarkable Adrenals/Urinary Tract: The adrenal glands are unremarkable. The kidneys are normal in size and position. Punctate 1-2 mm nonobstructing calculi are noted within the interpolar region of the kidneys bilaterally. No ureteral calculi. No hydronephrosis. No perinephric fluid collections. Foley catheter balloon is seen within a decompressed bladder lumen. Stomach/Bowel: Stomach is within normal limits. Appendix absent. No evidence of bowel wall thickening, distention, or inflammatory changes. Vascular/Lymphatic: Mild atherosclerotic calcification within the left common iliac artery. No aortic aneurysm. No pathologic adenopathy within the abdomen and pelvis. Reproductive: Uterus and bilateral adnexa are unremarkable. Other: Tiny fat containing  umbilical hernia. Musculoskeletal: Stable grade 1-2 anterolisthesis L5-S1. No acute bone abnormality. No lytic or blastic bone lesion. IMPRESSION: 1. No acute intrathoracic or intra-abdominal pathology identified. No definite radiographic explanation for the patient's reported symptoms. 2. Bronchial wall thickening in keeping with airway inflammation. 3. Minimal bilateral nonobstructing nephrolithiasis. 4. Cholelithiasis. 5. Stable grade 1-2 anterolisthesis L5-S1. Electronically Signed   By: Fidela Salisbury M.D.   On: 12/05/2021 03:34   CT HEAD WO CONTRAST (5MM)  Result Date: 12/05/2021 CLINICAL DATA:  Altered mental status, nontraumatic (Ped 0-17y). Sepsis. EXAM: CT HEAD WITHOUT CONTRAST TECHNIQUE: Contiguous axial images were obtained from the base of the skull through the vertex without intravenous contrast. RADIATION DOSE REDUCTION: This exam was performed according to the departmental dose-optimization program which includes automated exposure control, adjustment of the mA and/or kV according to patient size and/or use of iterative reconstruction technique. COMPARISON:  None Available. FINDINGS: Brain: Normal anatomic configuration. No abnormal intra or extra-axial mass lesion or  fluid collection. No abnormal mass effect or midline shift. No evidence of acute intracranial hemorrhage or infarct. Ventricular size is normal. Cerebellum unremarkable. Vascular: Unremarkable Skull: Intact Sinuses/Orbits: There is moderate mucosal thickening within the frontal and maxillary sinuses bilaterally with opacification of several anterior ethmoid air cells bilaterally. Remaining paranasal sinuses are clear. Orbits are unremarkable. Other: Mastoid air cells and middle ear cavities are clear. IMPRESSION: 1. No acute intracranial abnormality. 2. Moderate paranasal sinus disease. Electronically Signed   By: Fidela Salisbury M.D.   On: 12/05/2021 03:20   US Abdomen Complete  Result Date: 12/04/2021 CLINICAL DATA:  Abnormal  liver function tests, renal dysfunction EXAM: ABDOMEN ULTRASOUND COMPLETE COMPARISON:  CT done on 07/05/2021 FINDINGS: Gallbladder: No gallstones or wall thickening visualized. No sonographic Murphy sign noted by sonographer. Common bile duct: Diameter: 2.5 mm Liver: There is slightly increased echogenicity in the liver. No focal abnormalities are seen. Portal vein is patent on color Doppler imaging with normal direction of blood flow towards the liver. IVC: No abnormality visualized. Pancreas: Visualized portion unremarkable. Spleen: Size and appearance within normal limits. Right Kidney: Length: 9.5 cm. There is increased cortical echogenicity. There is no hydronephrosis. Left Kidney: Length: 11 cm. There is increased echogenicity. There is no hydronephrosis. Small left renal calculus seen in the previous CT is not distinctly visualized. Abdominal aorta: No aneurysm visualized. Other findings: None. IMPRESSION: Fatty liver. Increased cortical echogenicity in both kidneys suggests medical renal disease. There is no hydronephrosis. Abdomen sonogram is otherwise unremarkable. Electronically Signed   By: Elmer Picker M.D.   On: 12/04/2021 20:23   DG Chest 2 View  Result Date: 12/04/2021 CLINICAL DATA:  Not feeling well for 3 days. EXAM: CHEST - 2 VIEW COMPARISON:  Chest radiograph 07/05/2021 FINDINGS: The cardiomediastinal silhouette is normal. There is no focal consolidation or pulmonary edema. There is no pleural effusion or pneumothorax There is no acute osseous abnormality. IMPRESSION: No radiographic evidence of acute cardiopulmonary process. Electronically Signed   By: Valetta Mole M.D.   On: 12/04/2021 16:57     Today   Subjective    Yezenia Westover today has no headache,no chest abdominal pain,no new weakness tingling or numbness, feels much better     Objective   Blood pressure 119/79, pulse 96, temperature 97.7 F (36.5 C), temperature source Oral, resp. rate 18, height 5\' 9"  (1.753 m),  weight 77.1 kg, last menstrual period 05/22/2012, SpO2 100 %.   Intake/Output Summary (Last 24 hours) at 12/12/2021 1116 Last data filed at 12/12/2021 0400 Gross per 24 hour  Intake 240 ml  Output 900 ml  Net -660 ml    Exam  Awake Alert, No new F.N deficits,    [place].AT,PERRAL Supple Neck,   Symmetrical Chest wall movement, Good air movement bilaterally, CTAB RRR,No Gallops,   +ve B.Sounds, Abd Soft, Non tender,  No Cyanosis, Clubbing or edema    Data Review   Recent Labs  Lab 12/06/21 1137 12/07/21 0131 12/09/21 0525 12/09/21 1638 12/10/21 0618 12/11/21 0334 12/12/21 0339  WBC 2.9*   < > 1.9* 1.8* 1.5* 1.6* 1.8*  HGB 11.9*   < > 9.3* 9.9* 8.5* 8.2* 8.0*  HCT 33.1*   < > 27.9* 30.3* 25.0* 24.7* 24.5*  PLT 19*   < > 14* 20* 27* 39* 62*  MCV 79.2*   < > 83.8 85.4 83.6 84.0 86.3  MCH 28.5   < > 27.9 27.9 28.4 27.9 28.2  MCHC 36.0   < > 33.3 32.7 34.0  33.2 32.7  RDW 14.0   < > 14.0 14.2 14.3 14.5 14.8  LYMPHSABS 0.5*  --   --   --   --   --  0.5*  MONOABS 0.0*  --   --   --   --   --  0.2  EOSABS 0.0  --   --   --   --   --  0.0  BASOSABS 0.0  --   --   --   --   --  0.0   < > = values in this interval not displayed.    Recent Labs  Lab 12/05/21 2232 12/05/21 2232 12/06/21 0111 12/06/21 1137 12/07/21 0131 12/07/21 1240 12/08/21 0047 12/09/21 0525 12/09/21 1638 12/10/21 0618 12/11/21 0334 12/12/21 0339  NA 137  --  140   < > 143 141 138 139 137 137 136 137  K 3.0*  --  3.0*   < > 3.0* 2.9* 2.3* 2.4* 3.5 3.3* 3.3* 4.0  CL 110  --  107   < > 101 97* 95* 100 101 105 104 110  CO2 11*  --  12*   < > 20* 22 25 28 26 25 25 23   GLUCOSE 137*  --  153*   < > 102* 96 132* 95 125* 108* 109* 99  BUN 87*  --  95*   < > 90* 88* 80* 63* 54* 55* 48* 38*  CREATININE 4.03*  --  4.30*   < > 3.90* 3.66* 3.47* 2.53* 2.26* 2.01* 1.71* 1.51*  CALCIUM 6.6*  --  7.1*   < > 7.3* 7.2* 6.6* 7.1* 7.4* 7.7* 7.8* 8.1*  AST 6,541*  --  5,812*  --  2,866* 2,611* 1,805* 1,011*  --  615* 395*  240*  ALT 1,894*  --  2,031*  --  1,607* 1,328* 1,018* 702*  --  539* 399* 313*  ALKPHOS 61  --  63  --  83 101 101 116  --  122 124 119  BILITOT 2.0*  --  2.6*  --  4.0* 5.1* 4.9* 3.5*  --  2.2* 1.6* 1.4*  ALBUMIN 1.8*  --  1.9*  --  1.9* 1.8* 1.6* 1.8*  --  1.8* 1.8* 1.9*  MG  --    < > 2.3  --  2.0  --  1.6* 1.7  --  2.2 1.8  --   PHOS  --    < > 4.5  --  2.3*  --  3.0 3.5  --  2.3* 4.4  --   PROCALCITON  --   --  18.61  --  12.88  --   --   --   --   --   --   --   INR 2.7*  --   --   --  1.3*  --  1.2  --   --   --   --   --   TSH  --   --   --   --   --   --   --   --   --   --   --  3.547  AMMONIA  --   --   --   --   --  53* 37*  --   --   --   --   --    < > = values in this interval not displayed.    Total Time in preparing paper work, data evaluation and todays exam -  35 minutes  Susa Raring M.D on 12/12/2021 at 11:16 AM  [facility]

## 2021-12-12 NOTE — Progress Notes (Signed)
Inpatient Rehabilitation Admission Medication Review by a Pharmacist  A complete drug regimen review was completed for this patient to identify any potential clinically significant medication issues.  High Risk Drug Classes Is patient taking? Indication by Medication  Antipsychotic Yes Compazine - prn N/V  Anticoagulant No   Antibiotic No   Opioid No   Antiplatelet No   Hypoglycemics/insulin No   Vasoactive Medication Yes Metoprolol - BP  Chemotherapy No   Other Yes Albuterol inhaler - asthma Flonase- allergies Singulair - allergies Protonix - reflux     Type of Medication Issue Identified Description of Issue Recommendation(s)  Drug Interaction(s) (clinically significant)     Duplicate Therapy     Allergy     No Medication Administration End Date     Incorrect Dose     Additional Drug Therapy Needed     Significant med changes from prior encounter (inform family/care partners about these prior to discharge). Crestor 20 mg daily Symbicort inhaler Xolair  Not resumed from prior to admission -- resume if or when appropriate during rehab stay or at discharge.  Other       Clinically significant medication issues were identified that warrant physician communication and completion of prescribed/recommended actions by midnight of the next day:  No  Pharmacist comments: Resume Crestor / Symbicort during rehab stay  Time spent performing this drug regimen review (minutes):  30 minutes   Thank you Okey Regal, PharmD

## 2021-12-12 NOTE — H&P (Signed)
Physical Medicine and Rehabilitation Admission H&P        Chief Complaint  Patient presents with   Functional deficits due to debility from metabolic encephalopathy/medical issues.        HPI: Megan Reid is a 56 year old female with history of DDD lumbar spine, asthma, autoimmune disease, Sjogren's syndrome with recent onset of Trigeminal neuralgia which was treated with addition of tegretol 11/18/21 . Family reported that she had been tylenol and ibuprofen additionally for pain control. . She was admitted on 12/04/21 with lethargy, poor po intake, 5 day history of non bloody diarrhea and abdominal pain. She was found to have metabolic encephalopathy with acuter renal and hepatic failure with BUN/SCr 69/3.41, AST 9740, ALT 2902, LDH->10,000, Lipase 209, WBC-1.8, Plt-41, INR-1.4, ammonia-36 and CK-1110. CXR was negative for acute process. Acute hepatitis panel negative, she was started on IV acetylcysteine and Dr. Michail Sermon consulted for input. She developed fever past admission, was started on IVF, blood cultures drawn and Vanc/Cefepime added due to concerns of sepsis with elevated procalcitonin. Tegretol level initially WNL. GI expressed concern for toxic ingestion and recommended trending LFTs/supportive care.    CT chest, abdomen and pelvis showed bronchial thickening keeping with airway inflammation, cholelithiasis and bilateral non-obst nephrolithiasis. Liver was unremarkable, normal kidneys and no bowel thickening or inflammatory changes noted. CT  head was negative for acute abnormality. 2D echo showed EF 60-65% with normal LVF, mild MVR and no wall abnormality. Dr. Joelyn Oms consulted and recommended supportive care with IVF and bicarb added for metabolic acidosis.  Patient with history of pancytopenia and Work up negative of DRESS, tickborne illness, CMV, EBV and acute hepatitis/pancytopenia felt to secondary to with tegretol toxicity.  AKI felt to be due to ATN from acute illness/diarrhea  and ibuprofen use.    PCCM recommended 7 day course of antibiotics which were narrowed to rocephin secondary to pancytopenia. She had further drop in platelets to 11 and Dr. Alen Blew recommended transfusion for drop <10 or signs of bleeding. Neutropenia resolving from nadir of 1.5-->1.8, platelets steadily improving to 62 but hemoglobin with steady trend down to 8.0 likely due to hemodilution. Electrolyte abnormalities have been supplemented, abnormal LFTs/AKI is resolving and lethargy improving.   PT/OT has been working with patient who continues to be limited by weakness, DOE with increased WOB, working on EOB activity as well as standing with Stedy as LLE reported to be buckling. Also patient continues to be encephalopathic with delayed processing and needs simple commands for basic activity. CIR recommended due to functional decline.      ROS         Past Medical History:  Diagnosis Date   Arthralgia 02/14/2016   Asthma     Autoimmune disease ([place]) 02/14/2016    Positive RF 139 CCP Negative  Positive ANA 1:640 Positive Ro Positive La Elevated ESR 105 - Sjorgen's Disease   DDD (degenerative disc disease), lumbar 02/14/2016   Fatigue 02/14/2016   Gastritis 02/14/2016   Hot flashes     Migraines 02/14/2016   Osteoarthritis of both hands 02/14/2016   Osteoarthritis of both knees 02/14/2016   Vitamin D deficiency 02/14/2016         Past Surgical History:  Procedure Laterality Date   BREAST BIOPSY Left     ROTATOR CUFF REPAIR Right 06/16/2019   TUBAL LIGATION               Family History  Problem Relation Age of Onset   Kidney failure Mother  Heart failure Mother     Lupus Mother     COPD Father     Hypertension Sister     Acromegaly Sister     Breast cancer Paternal Aunt 54      Social History:  reports that she quit smoking about 35 years ago. Her smoking use included cigarettes. She has a 1.00 pack-year smoking history. She has never used smokeless tobacco. She reports  that she does not drink alcohol and does not use drugs.          Allergies  Allergen Reactions   Sulfa Antibiotics Anaphylaxis, Hives and Swelling      Swelling of the face and tongue    Tegretol [Carbamazepine] Other (See Comments)      Suspected hepatotoxicity and pancytopenia, required inpatient admission 11/2021            Medications Prior to Admission  Medication Sig Dispense Refill   albuterol (PROVENTIL HFA;VENTOLIN HFA) 108 (90 Base) MCG/ACT inhaler Inhale 2 puffs into the lungs 2 (two) times daily. For shortness of breath 18 g 0   carbamazepine (TEGRETOL) 200 MG tablet Take 1 tablet daily x 1 week, then increase to 1 tablet twice daily (Patient taking differently: Take 200 mg by mouth See admin instructions. Take 1 tablet daily x 1 week, then increase to 1 tablet twice daily) 60 tablet 3   hydrochlorothiazide (HYDRODIURIL) 25 MG tablet Take 25 mg by mouth every morning.       montelukast (SINGULAIR) 10 MG tablet Take 1 tablet (10 mg total) by mouth at bedtime. 30 tablet 5   omalizumab (XOLAIR) 150 MG/ML prefilled syringe INJECT 2 SYRINGES UNDER THE SKIN EVERY 2 WEEKS. TOTAL DOSE =375MG (Patient taking differently: Inject 300 mg into the skin every 14 (fourteen) days. Take along with 75 mg syringe. Total = 375 mg) 4 mL 2   omalizumab (XOLAIR) 75 MG/0.5ML prefilled syringe INJECT 1 SYRINGE UNDER THE SKIN EVERY 2 WEEKS. TOTAL DOSE = 375MG (Patient taking differently: Inject 75 mg into the skin every 14 (fourteen) days. Take along with 150 mg (x2) syringes for total of 375 mg) 2 mL 2   omeprazole (PRILOSEC) 20 MG capsule Take 20 mg by mouth in the morning and at bedtime.       pantoprazole (PROTONIX) 40 MG tablet Take 1 tablet (40 mg total) by mouth daily. 30 tablet 5   rosuvastatin (CRESTOR) 20 MG tablet Take 20 mg by mouth daily.       SYMBICORT 160-4.5 MCG/ACT inhaler INHALE 2 PUFFS INTO THE LUNGS TWICE DAILY (Patient taking differently: Inhale 2 puffs into the lungs in the morning  and at bedtime.) 10.2 g 5   docusate sodium (COLACE) 100 MG capsule Take 1 capsule (100 mg total) by mouth every 12 (twelve) hours. (Patient not taking: Reported on 12/05/2021) 60 capsule 0   fluticasone (FLONASE) 50 MCG/ACT nasal spray Place 1 spray into both nostrils 2 (two) times daily as needed (nasal congestion). (Patient not taking: Reported on 12/05/2021) 16 g 5   glycerin adult 2 g suppository Place 1 suppository rectally as needed for constipation. (Patient not taking: Reported on 12/05/2021) 12 suppository 0   ondansetron (ZOFRAN-ODT) 4 MG disintegrating tablet Take 1 tablet (4 mg total) by mouth every 8 (eight) hours as needed for nausea or vomiting. (Patient not taking: Reported on 12/05/2021) 20 tablet 0   polyethylene glycol powder (GLYCOLAX/MIRALAX) 17 GM/SCOOP powder Take 17 g by mouth 2 (two) times daily. (Patient not taking: Reported  on 12/05/2021) 255 g 1   Spacer/Aero-Holding Chambers (AEROCHAMBER PLUS) inhaler Use as instructed 2 each 2   valACYclovir (VALTREX) 1000 MG tablet Take 1,000 mg by mouth daily. 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 ADLs Comments: independent   Functional Status:  Mobility: Bed Mobility Overal bed mobility: Needs Assistance Bed Mobility: Supine to Sit, Sit to Supine Supine to sit: Mod assist Sit to supine: Min guard General bed mobility  comments: Mod A to pull to sitting, and shifting hips to EOB Transfers Overall transfer level: Needs assistance Equipment used: Rolling walker (2 wheels) Transfers: Sit to/from Stand Sit to Stand: Min assist, Mod assist General transfer comment: mod to initiate and min to maintain Ambulation/Gait General Gait Details: unable to safely walk   ADL: ADL Overall ADL's : Needs assistance/impaired Eating/Feeding: Set up, Sitting Grooming: Wash/dry hands, Wash/dry face, Set up, Sitting Grooming Details (indicate cue type and reason): completed on BSC Upper Body Bathing: Minimal assistance, Sitting Lower Body Bathing: Moderate assistance, +2 for physical assistance, +2 for safety/equipment, Sitting/lateral leans, Sit to/from stand Upper Body Dressing : Minimal assistance, Sitting Lower Body Dressing: Moderate assistance, +2 for physical assistance, +2 for safety/equipment, Sitting/lateral leans, Sit to/from stand Toilet Transfer: Moderate assistance, Stand-pivot, BSC/3in1 Toilet Transfer Details (indicate cue type and reason): Mod A to power up and steady with pivot to BSC, LLE giving under hear Toileting- Clothing Manipulation and Hygiene: Moderate assistance, Sitting/lateral lean, Sit to/from stand Toileting - Clothing Manipulation Details (indicate cue type and reason): Mod A for pericare, especially in standing due to poor balance Functional mobility during ADLs: Moderate assistance, Rolling walker (2 wheels) General ADL Comments: Requriing assist for all standing tasks, continues to have slow processsing and needing repeated simple commands to follow through   Cognition: Cognition Overall Cognitive Status: Impaired/Different from baseline Orientation Level: Oriented X4 Cognition Arousal/Alertness: Lethargic Behavior During Therapy: Flat affect Overall Cognitive Status: Impaired/Different from baseline Area of Impairment: Following commands, Safety/judgement, Awareness Current  Attention Level: Selective Following Commands: Follows one step commands with increased time Safety/Judgement: Decreased awareness of deficits, Decreased awareness of safety Awareness: Intellectual Problem Solving: Slow processing General Comments: time to initiate and process are slowed but follows through   Physical Exam: Blood pressure 119/79, pulse 96, temperature 97.7 F (36.5 C), temperature source Oral, resp. rate 18, height 5' 9"  (1.753 m), weight 77.1 kg, last menstrual period 05/22/2012, SpO2 100 %. Physical Exam   General: No acute distress Mood and affect are appropriate Heart: Regular rate and rhythm no rubs murmurs or extra sounds Lungs: Clear to auscultation, breathing unlabored, no rales or wheezes Abdomen: Positive bowel sounds, soft nontender to palpation, nondistended Extremities: No clubbing, cyanosis, or edema Skin: No evidence of breakdown, no evidence of rash Neurologic: Cranial nerves II through XII intact, motor strength is 5/5 in bilateral deltoid, bicep, tricep, grip, hip flexor, knee extensors, ankle dorsiflexor and plantar flexor Sensory exam normal sensation to light touch and proprioception in bilateral upper  and lower extremities Cerebellar exam normal finger to nose to finger as well as heel to shin in bilateral upper and lower extremities Musculoskeletal: Full range of motion in all 4 extremities. No joint swelling  Lab Results Last 48 Hours        Results for orders placed or performed during the hospital encounter of 12/04/21 (from the past 48 hour(s))  CBC     Status: Abnormal    Collection Time: 12/11/21  3:34 AM  Result Value Ref Range    WBC 1.6 (L) 4.0 - 10.5 K/uL    RBC 2.94 (L) 3.87 - 5.11 MIL/uL    Hemoglobin 8.2 (L) 12.0 - 15.0 g/dL    HCT 24.7 (L) 36.0 - 46.0 %    MCV 84.0 80.0 - 100.0 fL    MCH 27.9 26.0 - 34.0 pg    MCHC 33.2 30.0 - 36.0 g/dL    RDW 14.5 11.5 - 15.5 %    Platelets 39 (L) 150 - 400 K/uL      Comment: Immature  Platelet Fraction may be clinically indicated, consider ordering this additional test [id number] REPEATED TO VERIFY      nRBC 0.0 0.0 - 0.2 %      Comment: Performed at [facility], [place] [address], [place], [place] [address]  Comprehensive metabolic panel     Status: Abnormal    Collection Time: 12/11/21  3:34 AM  Result Value Ref Range    Sodium 136 135 - 145 mmol/L    Potassium 3.3 (L) 3.5 - 5.1 mmol/L    Chloride 104 98 - 111 mmol/L    CO2 25 22 - 32 mmol/L    Glucose, Bld 109 (H) 70 - 99 mg/dL      Comment: Glucose reference range applies only to samples taken after fasting for at least 8 hours.    BUN 48 (H) 6 - 20 mg/dL    Creatinine, Ser 1.71 (H) 0.44 - 1.00 mg/dL    Calcium 7.8 (L) 8.9 - 10.3 mg/dL    Total Protein 6.2 (L) 6.5 - 8.1 g/dL    Albumin 1.8 (L) 3.5 - 5.0 g/dL    AST 395 (H) 15 - 41 U/L    ALT 399 (H) 0 - 44 U/L    Alkaline Phosphatase 124 38 - 126 U/L    Total Bilirubin 1.6 (H) 0.3 - 1.2 mg/dL    GFR, Estimated 35 (L) >60 mL/min      Comment: (NOTE) Calculated using the CKD-EPI Creatinine Equation (2021)      Anion gap 7 5 - 15      Comment: Performed at [facility], [place] [address] [place], [place] [address]  Magnesium     Status: None    Collection Time: 12/11/21  3:34 AM  Result Value Ref Range    Magnesium 1.8 1.7 - 2.4 mg/dL      Comment: Performed at [facility] [address] [place], [place] [address]  Phosphorus     Status: None    Collection Time: 12/11/21  3:34 AM  Result Value Ref Range    Phosphorus 4.4 2.5 - 4.6 mg/dL      Comment: Performed at [facility] [address], [place], [place] [address]  Comprehensive metabolic panel     Status: Abnormal    Collection Time: 12/12/21  3:39 AM  Result Value Ref Range    Sodium 137 135 - 145 mmol/L  Potassium 4.0 3.5 - 5.1 mmol/L    Chloride 110 98 - 111 mmol/L    CO2 23 22 - 32 mmol/L    Glucose, Bld 99 70 - 99 mg/dL      Comment: Glucose reference range  applies only to samples taken after fasting for at least 8 hours.    BUN 38 (H) 6 - 20 mg/dL    Creatinine, Ser 1.51 (H) 0.44 - 1.00 mg/dL    Calcium 8.1 (L) 8.9 - 10.3 mg/dL    Total Protein 6.2 (L) 6.5 - 8.1 g/dL    Albumin 1.9 (L) 3.5 - 5.0 g/dL    AST 240 (H) 15 - 41 U/L    ALT 313 (H) 0 - 44 U/L    Alkaline Phosphatase 119 38 - 126 U/L    Total Bilirubin 1.4 (H) 0.3 - 1.2 mg/dL    GFR, Estimated 40 (L) >60 mL/min      Comment: (NOTE) Calculated using the CKD-EPI Creatinine Equation (2021)      Anion gap 4 (L) 5 - 15      Comment: Performed at [facility], [employer] [address], [place], [place] [address]  CBC with Differential/Platelet     Status: Abnormal    Collection Time: 12/12/21  3:39 AM  Result Value Ref Range    WBC 1.8 (L) 4.0 - 10.5 K/uL    RBC 2.84 (L) 3.87 - 5.11 MIL/uL    Hemoglobin 8.0 (L) 12.0 - 15.0 g/dL    HCT 24.5 (L) 36.0 - 46.0 %    MCV 86.3 80.0 - 100.0 fL    MCH 28.2 26.0 - 34.0 pg    MCHC 32.7 30.0 - 36.0 g/dL    RDW 14.8 11.5 - 15.5 %    Platelets 62 (L) 150 - 400 K/uL      Comment: REPEATED TO VERIFY    nRBC 0.0 0.0 - 0.2 %    Neutrophils Relative % 63 %    Neutro Abs 1.1 (L) 1.7 - 7.7 K/uL    Lymphocytes Relative 27 %    Lymphs Abs 0.5 (L) 0.7 - 4.0 K/uL    Monocytes Relative 8 %    Monocytes Absolute 0.2 0.1 - 1.0 K/uL    Eosinophils Relative 0 %    Eosinophils Absolute 0.0 0.0 - 0.5 K/uL    Basophils Relative 1 %    Basophils Absolute 0.0 0.0 - 0.1 K/uL    Immature Granulocytes 1 %    Abs Immature Granulocytes 0.01 0.00 - 0.07 K/uL      Comment: Performed at [facility], [place] [address], [place], [place] [address]  TSH     Status: None    Collection Time: 12/12/21  3:39 AM  Result Value Ref Range    TSH 3.547 0.350 - 4.500 uIU/mL      Comment: Performed by a 3rd Generation assay with a functional sensitivity of <=0.01 uIU/mL. Performed at [facility], [place] [address] [place], [place] [address]        Imaging Results  (Last 48 hours)  No results found.         Blood pressure 119/79, pulse 96, temperature 97.7 F (36.5 C), temperature source Oral, resp. rate 18, height 5' 9"  (1.753 m), weight 77.1 kg, last menstrual period 05/22/2012, SpO2 100 %.   Medical Problem List and Plan: 1. Functional deficits secondary to debility secondary to SIRS             -  patient may  shower             -ELOS/Goals: 7-10d, Mod I ADL and mobility  2.  Antithrombotics: -DVT/anticoagulation:  Mechanical: Sequential compression devices, entire leg Bilateral lower extremities             -antiplatelet therapy: N/A 3. Pain Management: Local measures for now.  4. Mood/Behavior/Sleep: LCSW to follow for evaluation and support.              -antipsychotic agents: N/A 5. Neuropsych/cognition: This patient is not fully capable of making decisions on her own behalf. 6. Skin/Wound Care: Routine pressure relief measures.  7. Fluids/Electrolytes/Nutrition: Continue to monitor renal status/LFTs closely.              --recheck CBC/CMET in am. 8. Sjogren's: Treated with Xolair every 14 days.  9. Abnormal LFTs: Much improved. AST 240, ALT 313, TB 1.4. INR- 1.2             --ammonia level 36-->53-->37.   -- Recheck LFTs/ammonia in am.  10 AKI: Resolving. BUN/SCR improved from 95/4.3-->38/1.51 --recheck renal status in am. 11. Acute on chronic thrombocytopenia: Improving overall--followed by Dr. Alen Blew.               --Intermittent hematuria documented 8/25, 8/26, 8/27              --Labs below pre-admit-->admit--Nadir-->current admit --Platelets (121)  41---> 11--->39 --WBC (4.0) 1.8--->1.5-->1.6 --Hgb (11.6)  13.6-->8.2    Latest Ref Rng & Units 12/12/2021    3:39 AM 12/11/2021    3:34 AM 12/10/2021    6:18 AM  CBC  WBC 4.0 - 10.5 K/uL 1.8  1.6  1.5   Hemoglobin 12.0 - 15.0 g/dL 8.0  8.2  8.5   Hematocrit 36.0 - 46.0 % 24.5  24.7  25.0   Platelets 150 - 400 K/uL 62  39  27   Pancytopenia, counts stable   12. HTN: Monitor BP  TID. Continue to hold HCTZ.             --on metoprolol BID-->question affecting respiratory status. No wheezing on exam today  13. H/o Asthma: On singulair. Resume MDI as SOB/increased WOB reported with minimal activity.  14. Constipation: Will start Miralax every other day. Bary Leriche, PA-C 12/12/2021  "I have personally performed a face to face diagnostic evaluation of this patient.  Additionally, I have reviewed and concur with the physician assistant's documentation above."  Charlett Blake M.D. [facility] Fellow Am Acad of Phys Med and Rehab Diplomate Am Board of Electrodiagnostic Med Fellow Am Board of Interventional Pain

## 2021-12-12 NOTE — Progress Notes (Addendum)
Admitted today with no issues. Oriented to unit & assigned room. Accompanied by husband. Assessments/Admission complete this shift.   Tilden Dome, LPN

## 2021-12-12 NOTE — Discharge Instructions (Signed)
Follow with Primary MD [name], MD in 7 days   Get CBC, CMP, 2 view Chest X ray -  checked next visit within 1 week by Primary MD  Activity: As tolerated with Full fall precautions use walker/cane & assistance as needed  Disposition CIR  Diet: Heart Healthy   Special Instructions: If you have smoked or chewed Tobacco  in the last 2 yrs please stop smoking, stop any regular Alcohol  and or any Recreational drug use.  On your next visit with your primary care physician please Get Medicines reviewed and adjusted.  Please request your Prim.MD to go over all Hospital Tests and Procedure/Radiological results at the follow up, please get all Hospital records sent to your Prim MD by signing hospital release before you go home.  If you experience worsening of your admission symptoms, develop shortness of breath, life threatening emergency, suicidal or homicidal thoughts you must seek medical attention immediately by calling 911 or calling your MD immediately  if symptoms less severe.  You Must read complete instructions/literature along with all the possible adverse reactions/side effects for all the Medicines you take and that have been prescribed to you. Take any new Medicines after you have completely understood and accpet all the possible adverse reactions/side effects.

## 2021-12-12 NOTE — Progress Notes (Signed)
Inpatient Rehabilitation Admissions Coordinator   I have insurance approval and CIR bed to admit her today. I met with patient at bedside and spoke with her daughter, Gilman Buttner by phone. They are in agreement. I have alerted acute team, Dr Candiss Norse and TOC. I will make the arrangements to admit today.  Danne Baxter, RN, MSN Rehab Admissions Coordinator [phone] 12/12/2021 11:08 AM

## 2021-12-12 NOTE — Progress Notes (Signed)
PROGRESS NOTE        PATIENT DETAILS Name: Megan Reid Age: 56 y.o. Sex: female Date of Birth: August 07, 1965 Admit Date: 12/04/2021 Admitting Physician Collier Bullock, MD [id number], Ronie Spies, MD  Brief Summary: Patient is a 56 y.o.  female with history of Sjogren syndrome-recent right facial pain-started on Tegretol-brought to the ED for lethargy, generalized body pain, nonbloody diarrhea-patient was found to have acute metabolic encephalopathy in the setting of acute hepatitis, AKI, pancytopenia-initially admitted by [facility]-but deteriorated and subsequently transferred to the ICU.  Significant events: 8/20>> admit to [facility]-encephalopathy/AKI/acute hepatitis/fever-?  Side effect of carbamazepine. 8/21>> transferred to ICU. 8/24>> transfer back to [facility].  Significant studies: 8/20>> CXR: No PNA 8/20>> abdominal ultrasound: Fatty liver, no hydronephrosis. 8/20>>Acute hepatitis serology: Negative 8/20>> Tegretol level: 4.3 (normal) 8/21>> Tylenol level:<10 8/21>> antimitochondrial /anti-smooth muscle/anti microsomal antibody: Negative [id number] Ehrlichia antibody panel: Negative 8/21>> Lyme Antibody: Negative 8/21>> RMSF titers: Negative 8/21>> CMV titers: Negative 8/21>> EBV PCR: Negative 8/21>> ANA: Positive, dsDNA negative 8/21>> CT chest/abdomen/pelvis: No acute abnormality identified. 8/21>> Echo: EF 60-65%  Significant microbiology data: 8/21>> blood culture: No growth 8/21>> COVID PCR: Negative.  Procedures: None  Consults: GI PCCM Nephrology  Subjective:  Patient in bed, appears comfortable, denies any headache, no fever, no chest pain or pressure, no shortness of breath , no abdominal pain. No new focal weakness. Eager to go to SNF.   Objective: Vitals: Blood pressure 119/79, pulse 96, temperature 97.7 F (36.5 C), temperature source Oral, resp. rate 18, height 5\' 9"  (1.753 m), weight 77.1 kg, last menstrual period 05/22/2012, SpO2  100 %.   Exam:  Awake Alert, No new F.N deficits, Normal affect [place].AT,PERRAL Supple Neck, No JVD,   Symmetrical Chest wall movement, Good air movement bilaterally, CTAB RRR,No Gallops, Rubs or new Murmurs,  +ve B.Sounds, Abd Soft, No tenderness,   No Cyanosis, Clubbing or edema     Assessment/Plan:  Acute hepatitis/pancytopenia: Felt to be due to carbamazepine-LFTs continue to downtrend-platelet count slowly improving.  Hb/WBC low but stable.   Continue supportive care.  GI has signed off.  Dr Sloan Leiter discussed/chart reviewed with Dr. Alen Blew on 8/24-recommends supportive care-with recommendations to transfuse if bleeding or platelet count <10.  Overall clinically improving medically ready for discharge await SNF/CIR bed.  AKI: Likely hemodynamically mediated-possibly ATN-in the setting of acute illness/diarrhea/use of ibuprofen.  UA with proteinuria.  Renal function gradually improving-nephrology has signed off, post discharged outpatient nephrology follow-up.  Hypokalemia, hypomagnesemia, hypophosphatemia.  Replaced.  Acute metabolic encephalopathy: In the setting of hepatitis/AKI-better but still somewhat slow-continue supportive care.  SIRS: Afebrile x2 days now-suspect this is noninfectious-likely related to hepatitis/carbamazepine side effect.  Initially on cefepime-but narrowed down to Rocephin-with plans to complete 7 days of empiric antibiotics.   History of Sjogren syndrome with trigeminal neuralgia: Supportive care-would not rechallenge with Tegretol in the future.  Will need to follow-up with primary neurologist postdischarge.  Currently without any facial pain.  History of asthma: Not in exacerbation-continue bronchodilators.  HTN:  added Lopressor- HCTZ remains on hold given AKI/hypokalemia.  Mild intermittent sinus tachycardia.  Check TSH.  BMI: Estimated body mass index is 25.1 kg/m as calculated from the following:   Height as of this encounter: 5\' 9"  (1.753 m).    Weight as of this encounter: 77.1 kg.   Code status:   Code Status: Full Code  DVT Prophylaxis: Place and maintain sequential compression device Start: 12/06/21 0759 SCDs Start: 12/05/21 0140 SCDs Start: 12/04/21 2125   Family Communication: Sister/daughter at bedside.  Disposition Plan: Status is: Inpatient Remains inpatient appropriate because: Severe [place]-improving AKI/hepatitis/pancytopenia-CIR probably early next week.   Planned Discharge Destination:CIR   Diet: Diet Order             Diet regular Room service appropriate? Yes; Fluid consistency: Thin  Diet effective now                   MEDICATIONS: Scheduled Meds:  Chlorhexidine Gluconate Cloth  6 each Topical Daily   feeding supplement  237 mL Oral BID BM   fluticasone  2 spray Each Nare Daily   metoprolol tartrate  25 mg Oral BID   montelukast  10 mg Oral QHS   multivitamin with minerals  1 tablet Oral Daily   pantoprazole  40 mg Oral Daily   Continuous Infusions:  PRN Meds:.docusate sodium, menthol-cetylpyridinium, ondansetron (ZOFRAN) IV, mouth rinse, polyethylene glycol   I have personally reviewed following labs and imaging studies  LABORATORY DATA:  Recent Labs  Lab 12/06/21 1137 12/07/21 0131 12/09/21 0525 12/09/21 1638 12/10/21 0618 12/11/21 0334 12/12/21 0339  WBC 2.9*   < > 1.9* 1.8* 1.5* 1.6* 1.8*  HGB 11.9*   < > 9.3* 9.9* 8.5* 8.2* 8.0*  HCT 33.1*   < > 27.9* 30.3* 25.0* 24.7* 24.5*  PLT 19*   < > 14* 20* 27* 39* 62*  MCV 79.2*   < > 83.8 85.4 83.6 84.0 86.3  MCH 28.5   < > 27.9 27.9 28.4 27.9 28.2  MCHC 36.0   < > 33.3 32.7 34.0 33.2 32.7  RDW 14.0   < > 14.0 14.2 14.3 14.5 14.8  LYMPHSABS 0.5*  --   --   --   --   --  0.5*  MONOABS 0.0*  --   --   --   --   --  0.2  EOSABS 0.0  --   --   --   --   --  0.0  BASOSABS 0.0  --   --   --   --   --  0.0   < > = values in this interval not displayed.    Recent Labs  Lab 12/05/21 2232 12/05/21 2232 12/06/21 0111  12/06/21 1137 12/07/21 0131 12/07/21 1240 12/08/21 0047 12/09/21 0525 12/09/21 1638 12/10/21 0618 12/11/21 0334 12/12/21 0339  NA 137  --  140   < > 143 141 138 139 137 137 136 137  K 3.0*  --  3.0*   < > 3.0* 2.9* 2.3* 2.4* 3.5 3.3* 3.3* 4.0  CL 110  --  107   < > 101 97* 95* 100 101 105 104 110  CO2 11*  --  12*   < > 20* 22 25 28 26 25 25 23   GLUCOSE 137*  --  153*   < > 102* 96 132* 95 125* 108* 109* 99  BUN 87*  --  95*   < > 90* 88* 80* 63* 54* 55* 48* 38*  CREATININE 4.03*  --  4.30*   < > 3.90* 3.66* 3.47* 2.53* 2.26* 2.01* 1.71* 1.51*  CALCIUM 6.6*  --  7.1*   < > 7.3* 7.2* 6.6* 7.1* 7.4* 7.7* 7.8* 8.1*  AST 6,541*  --  5,812*  --  2,866* 2,611* 1,805* 1,011*  --  615* 395* 240*  ALT  8,299*  --  2,031*  --  1,607* 1,328* 1,018* 702*  --  539* 399* 313*  ALKPHOS 61  --  63  --  83 101 101 116  --  122 124 119  BILITOT 2.0*  --  2.6*  --  4.0* 5.1* 4.9* 3.5*  --  2.2* 1.6* 1.4*  ALBUMIN 1.8*  --  1.9*  --  1.9* 1.8* 1.6* 1.8*  --  1.8* 1.8* 1.9*  MG  --    < > 2.3  --  2.0  --  1.6* 1.7  --  2.2 1.8  --   PHOS  --    < > 4.5  --  2.3*  --  3.0 3.5  --  2.3* 4.4  --   PROCALCITON  --   --  18.61  --  12.88  --   --   --   --   --   --   --   INR 2.7*  --   --   --  1.3*  --  1.2  --   --   --   --   --   AMMONIA  --   --   --   --   --  53* 37*  --   --   --   --   --    < > = values in this interval not displayed.    RADIOLOGY STUDIES/RESULTS: No results found.   LOS: 8 days   Signature  Susa Raring M.D on 12/12/2021 at 8:42 AM   -  To page go to [email or web address]

## 2021-12-12 NOTE — Progress Notes (Signed)
Charlett Blake, MD  Physician Physical Medicine and Rehabilitation PMR Pre-admission    Signed Date of Service:  12/09/2021 12:47 PM  Related encounter: ED to Hosp-Admission (Discharged) from 12/04/2021 in [facility]   Signed      PMR Admission Coordinator Pre-Admission Assessment   Patient: Megan Reid is an 56 y.o., female MRN: [id number] DOB: 1966-03-30 Height: _0  (175.3 cm) Weight: 77.1 kg   Insurance Information HMO:     PPO:  Yes    PCP:      IPA:      80/20:      OTHER:  PRIMARY: Megan Reid      Policy#: [id number]   Group# [id number]   Subscriber: Patient CM Name:  Megan Reid    Phone#: [phone]  Fax#: [phone]-Cert#: [id number] until 9/7 when updates are due     Employer: A[employer] MotorsBenefits:  Phone #: [phone]([id number])     Name:  Eff. Date:  04/17/21- still active but terms 8/31 ( patient and daughter, PGilman Buttner made aware to check term date)   Deduct:  $1,250 ( $1,250 met)     Out of Pocket Max: $4,890, ($4,890 met)     CIR: 80% coverage, 20% co-insurance     SNF: 80%, 20% co-insurance  Outpatient:  $10 or $52 co-pay per visit pending provider type, limited by med nec.     Home Health:80% coverage, 20% co-insurance, limited by med nDelma Post     DME:  80%    Co-Pay: 20% Providers:  SECONDARY: BCBS Policy#:[id number]   Financial Counselor:       Phone#:    The "Data Collection Information Summary" for patients in Inpatient Rehabilitation Facilities with attached "Privacy Act SLidderdaleRecords" was provided and verbally reviewed with: N/A   Emergency Contact Information Contact Information       Name Relation Home Work Mobile    MOremSpouse [phone]  [phone]   MC[facility] LLCDaughter     [phone]   WTowanda Reid    [phone]        Current Medical History  Patient Admitting Diagnosis: Acute Hepatitis   History of  Present Illness: [name] a 56yo woman with hx of sjogrens, recent trigeminal pain on R, pancytopenia, Asthma, lumbar spinal stenosis. [facility], poor po intake for several days.  About 5 days of non bloody diarrhea, 1 episode of vomiting early in the week.  Some diffuse abdominal pain x several daily.     GI consulted.  Patient placed on acetylcysteine.  Patient had recently started on Carbamazepine for trigeminal pain.   Acute hepatitis felt due to Carbamazepine. GI recommends supportive care. Transfuse if bleeding or paltelet count < 10. AKI in the setting of acute illness, diarhea, and use of ibuprofen. Renal funcion gradually improving. Followup outpaeitn with Nephrology. Acute metabloic encepahalopathy improving, but still somewhat cognitively slow. HCTZ remains on hold given AKI. On lopressor.  SIRS and afebrile for 2 days. Felt noninfectious. Initially on Cefepime but narrowed to Rocephin with plans to complete 7 days.   [facility] admission coordinator and physician.   Past Medical History      Past Medical History:  Diagnosis Date   Arthralgia 02/14/2016   Asthma     Autoimmune disease (HBoyle 02/14/2016  Positive RF 139 CCP Negative  Positive ANA 1:640 Positive Ro Positive La Elevated ESR 105 - Sjorgen's Disease   DDD (degenerative disc disease), lumbar 02/14/2016   Fatigue 02/14/2016   Gastritis 02/14/2016   Hot flashes     Migraines 02/14/2016   Osteoarthritis of both hands 02/14/2016   Osteoarthritis of both knees 02/14/2016   Vitamin D deficiency 02/14/2016    Has the patient had major surgery during 100 days prior to admission? No   Family History   family history includes Acromegaly in her sister; Breast cancer (age of onset: 35) in her paternal aunt; COPD in her father; Heart failure in her mother; Hypertension in her sister;  Kidney failure in her mother; Lupus in her mother.   Current Medications   Current Facility-Administered Medications:    Chlorhexidine Gluconate Cloth 2 % PADS 6 each, 6 each, Topical, Daily, Elie Confer, Sarah F, NP, 6 each at 12/08/21 0800   docusate sodium (COLACE) capsule 100 mg, 100 mg, Oral, BID PRN, Collier Bullock, MD, 100 mg at 12/11/21 1027   feeding supplement (ENSURE ENLIVE / ENSURE PLUS) liquid 237 mL, 237 mL, Oral, BID BM, Ghimire, Shanker M, MD, 237 mL at 12/12/21 0937   fluticasone (FLONASE) 50 MCG/ACT nasal spray 2 spray, 2 spray, Each Nare, Daily, Margaretha Seeds, MD, 2 spray at 12/10/21 7619   menthol-cetylpyridinium (CEPACOL) lozenge 3 mg, 1 lozenge, Oral, PRN, Candee Furbish, MD, 3 mg at 12/05/21 1821   metoprolol tartrate (LOPRESSOR) tablet 25 mg, 25 mg, Oral, BID, Thurnell Lose, MD, 25 mg at 12/12/21 0938   montelukast (SINGULAIR) tablet 10 mg, 10 mg, Oral, QHS, Candee Furbish, MD, 10 mg at 12/11/21 2230   multivitamin with minerals tablet 1 tablet, 1 tablet, Oral, Daily, Ghimire, Henreitta Leber, MD, 1 tablet at 12/12/21 0936   ondansetron (ZOFRAN) injection 4 mg, 4 mg, Intravenous, Q8H PRN, Magdalen Spatz, NP, 4 mg at 12/06/21 1827   Oral care mouth rinse, 15 mL, Mouth Rinse, PRN, Candee Furbish, MD   pantoprazole (PROTONIX) EC tablet 40 mg, 40 mg, Oral, Daily, Candee Furbish, MD, 40 mg at 12/12/21 0936   polyethylene glycol (MIRALAX / GLYCOLAX) packet 17 g, 17 g, Oral, Daily PRN, Collier Bullock, MD, 17 g at 12/11/21 1026   Patients Current Diet:  Diet Order                  Diet - low sodium heart healthy             Diet regular Room service appropriate? Yes; Fluid consistency: Thin  Diet effective now                       Precautions / Restrictions Precautions Precautions: Fall Precaution Comments: PICC Restrictions Weight Bearing Restrictions: No    Has the patient had 2 or more falls or a fall with injury in the past year? No   Prior Activity  Level Community (5-7x/wk): independent   Prior Functional Level Self Care: Did the patient need help bathing, dressing, using the toilet or eating? Independent   Indoor Mobility: Did the patient need assistance with walking from room to room (with or without device)? Independent   Stairs: Did the patient need assistance with internal or external stairs (with or without device)? Independent   Functional Cognition: Did the patient need help planning regular tasks such as shopping or remembering to take medications? Independent   Patient Information Are you  of Hispanic, Latino/a,or Spanish origin?: A. No, not of Hispanic, Latino/a, or Spanish origin What is your race?: B. Black or African American Do you need or want an interpreter to communicate with a doctor or health care staff?: 0. No   Patient's Response To:  Health Literacy and Transportation Is the patient able to respond to health literacy and transportation needs?: Yes Health Literacy - How often do you need to have someone help you when you read instructions, pamphlets, or other written material from your doctor or pharmacy?: Never In the past 12 months, has lack of transportation kept you from medical appointments or from getting medications?: No In the past 12 months, has lack of transportation kept you from meetings, work, or from getting things needed for daily living?: No   Development worker, international aid / [place] Devices/Equipment: None Home Equipment: None   Prior Device Use: Indicate devices/aids used by the patient prior to current illness, exacerbation or injury? None of the above   Current Functional Level Cognition   Overall Cognitive Status: Impaired/Different from baseline Current Attention Level: Selective Orientation Level: Oriented X4 Following Commands: Follows one step commands with increased time Safety/Judgement: Decreased awareness of deficits, Decreased awareness of safety General Comments:  time to initiate and process are slowed but follows through    Extremity Assessment (includes Sensation/Coordination)   Upper Extremity Assessment: Generalized weakness, RUE deficits/detail RUE Deficits / Details: Pt keeping arm tight to her side with elbow flexed, unsure what is tone verses resistance RUE: Shoulder pain with ROM RUE Coordination: decreased fine motor, decreased gross motor  Lower Extremity Assessment: Defer to PT evaluation     ADLs   Overall ADL's : Needs assistance/impaired Eating/Feeding: Set up, Sitting Grooming: Wash/dry hands, Wash/dry face, Set up, Sitting Grooming Details (indicate cue type and reason): completed on BSC Upper Body Bathing: Minimal assistance, Sitting Lower Body Bathing: Moderate assistance, +2 for physical assistance, +2 for safety/equipment, Sitting/lateral leans, Sit to/from stand Upper Body Dressing : Minimal assistance, Sitting Lower Body Dressing: Moderate assistance, +2 for physical assistance, +2 for safety/equipment, Sitting/lateral leans, Sit to/from stand Toilet Transfer: Moderate assistance, Stand-pivot, BSC/3in1 Toilet Transfer Details (indicate cue type and reason): Mod A to power up and steady with pivot to BSC, LLE giving under hear Toileting- Clothing Manipulation and Hygiene: Moderate assistance, Sitting/lateral lean, Sit to/from stand Toileting - Clothing Manipulation Details (indicate cue type and reason): Mod A for pericare, especially in standing due to poor balance Functional mobility during ADLs: Moderate assistance, Rolling walker (2 wheels) General ADL Comments: Requriing assist for all standing tasks, continues to have slow processsing and needing repeated simple commands to follow through     Mobility   Overal bed mobility: Needs Assistance Bed Mobility: Supine to Sit, Sit to Supine Supine to sit: Mod assist Sit to supine: Min guard General bed mobility comments: Mod A to pull to sitting, and shifting hips to EOB      Transfers   Overall transfer level: Needs assistance Equipment used: Rolling walker (2 wheels) Transfers: Sit to/from Stand Sit to Stand: Min assist, Mod assist General transfer comment: mod to initiate and min to maintain     Ambulation / Gait / Stairs / Wheelchair Mobility   Ambulation/Gait General Gait Details: unable to safely walk     Posture / Balance Dynamic Sitting Balance Sitting balance - Comments: started propped on her knees but then was able to sit unsupported Balance Overall balance assessment: Needs assistance Sitting-balance support: Feet supported, Single extremity  supported Sitting balance-Leahy Scale: Fair Sitting balance - Comments: started propped on her knees but then was able to sit unsupported Standing balance support: Bilateral upper extremity supported, During functional activity Standing balance-Leahy Scale: Poor Standing balance comment: requires BUE support on RW     Special needs/care consideration      Previous Home Environment  Living Arrangements: Spouse/significant other  Lives With: Spouse Available Help at Discharge: Family Type of Home: House Home Layout: One level Home Access: Stairs to enter Entrance Stairs-Rails: Right Entrance Stairs-Number of Steps: 5 Bathroom Shower/Tub: Chiropodist: Standard Bathroom Accessibility: Yes How Accessible: Accessible via walker [place]: No   Discharge Living Setting Plans for Discharge Living Setting: Patient's home, Lives with (comment) (spouse, daughters) Type of Home at Discharge: House Discharge Home Layout: One level Discharge Home Access: Stairs to enter Entrance Stairs-Rails: Right Entrance Stairs-Number of Steps: 4-5 Discharge Bathroom Shower/Tub: Tub/shower unit Discharge Bathroom Toilet: Handicapped height Discharge Bathroom Accessibility: Yes How Accessible: Accessible via walker Does the patient have any problems obtaining your medications?: No    Social/Family/Support Systems Patient Roles: Spouse Anticipated Caregiver: Spouse, daughters Anticipated Caregiver's Contact Information: [phone] Caregiver Availability: 24/7 Discharge Plan Discussed with Primary Caregiver: Yes Is Caregiver In Agreement with Plan?: Yes Does Caregiver/Family have Issues with Lodging/Transportation while Pt is in Rehab?: No   Goals Patient/Family Goal for Rehab: Mod I with PT, OT and SLP Expected length of stay: 7-10 days Pt/Family Agrees to Admission and willing to participate: Yes Program Orientation Provided & Reviewed with Pt/Caregiver Including Roles  & Responsibilities: Yes  Barriers to Discharge: Insurance for SNF coverage   Decrease burden of Care through IP rehab admission: OtherN/A   Possible need for SNF placement upon discharge: not anitcipated   Patient Condition: I have reviewed medical records from [facility], spoken with  [facility] , and patient and daughter. I met with patient at the bedside for inpatient rehabilitation assessment.  Patient will benefit from ongoing PT and OT, can actively participate in 3 hours of therapy a day 5 days of the week, and can make measurable gains during the admission.  Patient will also benefit from the coordinated team approach during an Inpatient Acute Rehabilitation admission.  The patient will receive intensive therapy as well as Rehabilitation physician, nursing, social worker, and care management interventions.  Due to safety, skin/wound care, disease management, medication administration, pain management, and patient education the patient requires 24 hour a day rehabilitation nursing.  The patient is currently Min/Mod A +2 with mobility and basic ADLs.  Discharge setting and therapy post discharge at home with home health is anticipated.  Patient has agreed to participate in the Acute Inpatient Rehabilitation Program and will admit today.   Preadmission Screen Completed By: Amanda Cockayne with updates by  Cleatrice Burke, 12/12/2021 11:30 AM ______________________________________________________________________   Discussed status with Dr. Letta Pate on 12/13/21 at 77 and received approval for admission today.   Admission Coordinator: Amanda Cockayne with updates by Cleatrice Burke, RN, time 1130 Date 12/12/21    Assessment/Plan: Diagnosis:debility secondary to SIRS Does the need for close, 24 hr/day Medical supervision in concert with the patient's rehab needs make it unreasonable for this patient to be served in a less intensive setting? Yes Co-Morbidities requiring supervision/potential complications: Sjogren's disease, pancytopenia, lumbar stenosis Due to bladder management, bowel management, safety, skin/wound care, disease management, medication administration, pain management, and patient education, does the patient require 24 hr/day rehab nursing? Yes Does the patient require coordinated  care of a physician, rehab nurse, PT, OT, and SLP to address physical and functional deficits in the context of the above medical diagnosis(es)? Yes Addressing deficits in the following areas: balance, endurance, locomotion, strength, transferring, bowel/bladder control, bathing, dressing, feeding, grooming, toileting, cognition, and psychosocial support Can the patient actively participate in an intensive therapy program of at least 3 hrs of therapy 5 days a week? Yes The potential for patient to make measurable gains while on inpatient rehab is good Anticipated functional outcomes upon discharge from inpatient rehab: modified independent PT, modified independent OT, supervision SLP Estimated rehab length of stay to reach the above functional goals is: 7-10d Anticipated discharge destination: Home 10. Overall Rehab/Functional Prognosis: good     MD Signature: Charlett Blake M.D. [place] [facility] Fellow Am Acad of Phys Med and [place] Board of Electrodiagnostic  Med Fellow Am Board of Interventional Pain          Revision History                                                              Note Details  Author Charlett Blake, MD File Time 12/12/2021 12:11 PM  Author Type Physician Status Signed  Last Editor Charlett Blake, MD Service Physical Medicine and [place] # [id number] Admit Date 12/12/2021

## 2021-12-12 NOTE — Progress Notes (Addendum)
Physical Therapy Treatment Patient Details Name: Megan Reid MRN: [id number] DOB: 13-Oct-1965 Today's Date: 12/12/2021   History of Present Illness 56 y.o.  female brought to the ED for lethargy, generalized body pain, nonbloody diarrhea-patient was found to have acute metabolic encephalopathy in the setting of acute hepatitis, AKI, pancytopenia-initially admitted by TRH-but deteriorated and subsequently transferred to the ICU 8/21 and back to floor on 8/24. PMH:  Sjogren syndrome-recent right trigeminal neuralgia-started on Tegretol, rotator cuff repair 06/2019.    PT Comments    Pt received up on [facility], RN and pt spouse present, pt agreeable to therapy session with goal of progressing standing tolerance/gait with RW. Pt needing up to minA for gait with RW and up to [facility] for transfers. Pt making good progress toward goals with improved standing/gait tolerance compared with previous session and noted improved activity tolerance as platelet count improved this date. VSS on RA. Pt continues to benefit from PT services to progress toward functional mobility goals.   Recommendations for follow up therapy are one component of a multi-disciplinary discharge planning process, led by the attending physician.  Recommendations may be updated based on patient status, additional functional criteria and insurance authorization.  Follow Up Recommendations  Acute inpatient rehab (3hours/day)     Assistance Recommended at Discharge Frequent or constant Supervision/Assistance  Patient can return home with the following A lot of help with bathing/dressing/bathroom;Assistance with cooking/housework;Assist for transportation;Help with stairs or ramp for entrance;A lot of help with walking and/or transfers   Equipment Recommendations  Hospital bed;Wheelchair cushion (measurements PT);Wheelchair (measurements PT);Rolling walker (2 wheels);BSC/3in1    Recommendations for Other Services       Precautions /  Restrictions Precautions Precautions: Fall Precaution Comments: platelets <75k Restrictions Weight Bearing Restrictions: No     Mobility  Bed Mobility Overal bed mobility: Needs Assistance Bed Mobility: Supine to Sit, Sit to Supine       Sit to supine: Min guard   General bed mobility comments: use of bed features    Transfers Overall transfer level: Needs assistance Equipment used: Rolling walker (2 wheels) Transfers: Sit to/from Stand Sit to Stand: Min assist, Mod assist           General transfer comment: STS from EOB and BSC heights, cues to reach back with decreased eccentric control to sit EOB (modA for stand>sit); pt also performs seated side scooting toward HOB with minA and bed pad assist    Ambulation/Gait Ambulation/Gait assistance: Min assist, +2 safety/equipment Gait Distance (Feet): 55 Feet Assistive device: Rolling walker (2 wheels) Gait Pattern/deviations: Step-through pattern, Trunk flexed, Narrow base of support       General Gait Details: pt frequently keeping RW too far advanced, needing up to minA for posture and RW mangement, pt catching corner of RW on furniture in room at times; cues needed for environmental awareness. Pt spouse present and standing by to move furniture/objects in room due to limited space. Pt quick to fatigue but good effort with improved activity tolerance from previous sessions      Balance Overall balance assessment: Needs assistance Sitting-balance support: Feet supported, Single extremity supported Sitting balance-Leahy Scale: Fair Sitting balance - Comments: flexed trunk on BSC and EOB, encouraged upright posture for pulmonary clearance with poor carryover   Standing balance support: Bilateral upper extremity supported, During functional activity Standing balance-Leahy Scale: Poor Standing balance comment: requires BUE support on RW and some external assist  Cognition  Arousal/Alertness: Awake/alert Behavior During Therapy: Flat affect Overall Cognitive Status: Impaired/Different from baseline Area of Impairment: Following commands, Safety/judgement, Awareness                   Current Attention Level: Selective   Following Commands: Follows one step commands with increased time Safety/Judgement: Decreased awareness of deficits, Decreased awareness of safety Awareness: Intellectual Problem Solving: Requires verbal cues General Comments: Time to initiate and process are slowed but follows through, decreased safety awareness with transfers and at times bumping RW into objects in narrow spaces in room (not sure if due to attention or if visual deficit present). Pt spouse present and encouraging/helpful and standing by for safety during mobility.        Exercises Other Exercises Other Exercises: reviewed seated LE HEP including ankle pumps, hip flexion, LAQ but pt defers post-ambulation due to fatigue.    General Comments General comments (skin integrity, edema, etc.): SpO2 poor signal (synthetic nails on) and mild DOE (1-2/4), BP 129/87 (100) seated EOB post-exertion, no dizziness.      Pertinent Vitals/Pain Pain Assessment Pain Assessment: Faces Faces Pain Scale: Hurts a little bit Pain Location: generalized; no specific c/o Pain Descriptors / Indicators: Guarding, Grimacing Pain Intervention(s): Monitored during session, Repositioned           PT Goals (current goals can now be found in the care plan section) Acute Rehab PT Goals Patient Stated Goal: get up to move, to go to CIR for strengthening PT Goal Formulation: With patient/family Time For Goal Achievement: 12/21/21 Progress towards PT goals: Progressing toward goals    Frequency    Min 3X/week      PT Plan Current plan remains appropriate       AM-PAC PT "6 Clicks" Mobility   Outcome Measure  Help needed turning from your back to your side while in a flat bed  without using bedrails?: A Little Help needed moving from lying on your back to sitting on the side of a flat bed without using bedrails?: A Lot (flat bed with no rails) Help needed moving to and from a bed to a chair (including a wheelchair)?: A Lot Help needed standing up from a chair using your arms (e.g., wheelchair or bedside chair)?: A Lot (mod cues, modA stand>sit) Help needed to walk in hospital room?: A Little Help needed climbing 3-5 steps with a railing? : A Lot (anticipated based on gait) 6 Click Score: 14    End of Session Equipment Utilized During Treatment: Gait belt Activity Tolerance: Patient tolerated treatment well Patient left: in bed;with call bell/phone within reach;with family/visitor present (spouse to remain in room and agreeable to notify nursing staff if pt needs assist OOB) Nurse Communication: Mobility status PT Visit Diagnosis: Unsteadiness on feet (R26.81);Other abnormalities of gait and mobility (R26.89);Muscle weakness (generalized) (M62.81)     Time: 9798-9211 PT Time Calculation (min) (ACUTE ONLY): 15 min  Charges:  $Gait Training: 8-22 mins                     Megan Reid P., PTA Acute Rehabilitation Services Secure Chat Preferred 9a-5:30pm Office: [phone]    Dorathy Kinsman [facility] 12/12/2021, 1:14 PM

## 2021-12-13 ENCOUNTER — Ambulatory Visit: Payer: BC Managed Care – PPO | Admitting: Physical Therapy

## 2021-12-13 LAB — COMPREHENSIVE METABOLIC PANEL
ALT: 237 U/L — ABNORMAL HIGH (ref 0–44)
AST: 144 U/L — ABNORMAL HIGH (ref 15–41)
Albumin: 1.9 g/dL — ABNORMAL LOW (ref 3.5–5.0)
Alkaline Phosphatase: 118 U/L (ref 38–126)
Anion gap: 7 (ref 5–15)
BUN: 30 mg/dL — ABNORMAL HIGH (ref 6–20)
CO2: 24 mmol/L (ref 22–32)
Calcium: 8.3 mg/dL — ABNORMAL LOW (ref 8.9–10.3)
Chloride: 109 mmol/L (ref 98–111)
Creatinine, Ser: 1.37 mg/dL — ABNORMAL HIGH (ref 0.44–1.00)
GFR, Estimated: 45 mL/min — ABNORMAL LOW (ref >60)
Glucose, Bld: 96 mg/dL (ref 70–99)
Potassium: 3.9 mmol/L (ref 3.5–5.1)
Sodium: 140 mmol/L (ref 135–145)
Total Bilirubin: 1.3 mg/dL — ABNORMAL HIGH (ref 0.3–1.2)
Total Protein: 6.2 g/dL — ABNORMAL LOW (ref 6.5–8.1)

## 2021-12-13 LAB — PROTIME-INR
INR: 1 (ref 0.8–1.2)
Prothrombin Time: 13.1 seconds (ref 11.4–15.2)

## 2021-12-13 LAB — MAGNESIUM: Magnesium: 1.3 mg/dL — ABNORMAL LOW (ref 1.7–2.4)

## 2021-12-13 LAB — AMMONIA: Ammonia: 41 umol/L — ABNORMAL HIGH (ref 9–35)

## 2021-12-13 MED ORDER — ALBUTEROL SULFATE (2.5 MG/3ML) 0.083% IN NEBU: 2.5000 mg | INHALATION_SOLUTION | Freq: Four times a day (QID) | RESPIRATORY_TRACT | Status: DC | PRN

## 2021-12-13 NOTE — Progress Notes (Signed)
Call to room c/o dizziness and with rapid heart racing fast, chest pounding discomfort,feeling uncomfortable, anxiousness after receiving albuterol   treatment from RT.Patient assess HR 130,Respiration 20, Oxygen sat 100%,non productive cough occasionally, color normal reposition up in bed, encouraged to relax. Family member ( spouse on telephone during this episodes and reassurances provided by Clinical research associate, Refer to VS data sheet and monitoring . Notified assigned RT relate to this occurrence   [id number]  Notified on callElita Quick Love PA,secondary for patient reactions after receiving Albuterol treatment and HR increase 130,patient frequently assessed,Notified also RT concerning discomfort and patient concerns with receiving prior medication. Instructed by on call to monitor VS within 45 mins, Schedule Albuterol prn and discontinue schedule medication. Patient informed of medication changes and monitor prn. Patient states she is feeling slightly better. HOB elevated,, Closely monitor and assisted

## 2021-12-13 NOTE — Progress Notes (Signed)
Inpatient Rehabilitation Care Coordinator Assessment and Plan Patient Details  Name: Megan Reid MRN: [id number] Date of Birth: [birth year]  Today's Date: 12/13/2021  Hospital Problems: Principal Problem:   Toxic metabolic encephalopathy  Past Medical History:  Past Medical History:  Diagnosis Date   Arthralgia 02/14/2016   Asthma    Autoimmune disease ([place]) 02/14/2016   Positive RF 139 CCP Negative  Positive ANA 1:640 Positive Ro Positive La Elevated ESR 105 - Sjorgen's Disease   DDD (degenerative disc disease), lumbar 02/14/2016   Fatigue 02/14/2016   Gastritis 02/14/2016   Hot flashes    Migraines 02/14/2016   Osteoarthritis of both hands 02/14/2016   Osteoarthritis of both knees 02/14/2016   Vitamin D deficiency 02/14/2016   Past Surgical History:  Past Surgical History:  Procedure Laterality Date   BREAST BIOPSY Left    ROTATOR CUFF REPAIR Right 06/16/2019   TUBAL LIGATION     Social History:  reports that she quit smoking about 35 years ago. Her smoking use included cigarettes. She has a 1.00 pack-year smoking history. She has never used smokeless tobacco. She reports that she does not drink alcohol and does not use drugs.  Family / Support Systems Marital Status: Married Patient Roles: Spouse, Parent, Other (Comment) (employee) Spouse/Significant Other: Megan Reid [phone] Children: Megan Reid-daughter [phone] Other Supports: Friends and co-workers Anticipated Caregiver: Husband and daughter Ability/Limitations of Caregiver: husband Megan Reid and Sat 4:30 pm-4:30 am duahgter will be there when he is not-between both can provide 24/7 care Caregiver Availability: 24/7 Family Dynamics: Close with family and friends, she is one who likes to be independent and is used to taking care of others.  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 Length of Employment: 37 Return to Work Plans: Plans to return to work once able to and medically cleared Public relations account executive Issues: No issues Guardian/Conservator: None-according to MD pt is not fully capable of making her own deicsions while here will look toward her husband if any decisions need to be made while here   Abuse/Neglect Abuse/Neglect Assessment Can Be Completed: Yes Physical Abuse: Denies Verbal Abuse: Denies Sexual Abuse: Denies Exploitation of patient/patient's resources: Denies Self-Neglect: Denies  Patient response to: Social Isolation - How often do you feel lonely or isolated from those around you?: Never  Emotional Status Pt's affect, behavior and adjustment status: Pt is one who is very active and independent and takes care of others, she is not one to be in this role and doesn't really like it. Her husband is here and confirms this. Pt is aware she has cognitive issues and needs to clear before thinking about going back to school Recent Psychosocial Issues: other health issues-sjogrens syndrome Psychiatric History: No history may benefit from seeing neuro-psych while here for coping and counseling while here Substance Abuse History: No issues  Patient / Family Perceptions, Expectations & Goals Pt/Family understanding of illness & functional limitations: Pt and husband can explain her health issues and both do talk with the MD 's and feel they have a good understanding of her treatment plan moving forward. Premorbid pt/family roles/activities: wife, Pharmacist, hospital, mom, employee, friend, church member, etc Anticipated changes in roles/activities/participation: resume Pt/family expectations/goals: Pt states: " I hope to do well and  get clearer while here, I know I  can;t go back to school until I am."  Husband states: " I hope she does well here but we will be there for her."  [employer]: None Premorbid Home Care/DME Agencies: None Transportation available at discharge: self family will until able to drive again Is the patient able to respond to transportation needs?: Yes In the past 12 months, has lack of transportation kept you from medical appointments or from getting medications?: No In the past 12 months, has lack of transportation kept you from meetings, work, or from getting things needed for daily living?: No Resource referrals recommended: Neuropsychology  Discharge Planning Living Arrangements: Spouse/significant other, Children Support Systems: Spouse/significant other, Children, Water engineer, Other relatives, Church/faith community Type of Residence: Private residence Insurance Resources: Multimedia programmer (specify) Printmaker) Museum/gallery curator Resources: Employment, Secondary school teacher Screen Referred: No Living Expenses: Medical laboratory scientific officer Management: Patient, Spouse Does the patient have any problems obtaining your medications?: No Home Management: all-pt, husband and daughter Patient/Family Preliminary Plans: Return home with husband and daughter assisting-between both of them she will have 24/7 care if needed. Aware being evaluated today and goals being set. Will have more information for her tomorrow regarding goals and target DC date. She will get forms from school to get completed while here Care Coordinator Barriers to Discharge: Insurance for SNF coverage Care Coordinator Anticipated Follow Up Needs: HH/OP  Clinical Impression Pleasant female who is aware of her deficits and hopeful they will clear while here. She will get her school forms to be completed by MD while here. Aware team conference tomorrow will work on discharge needs. Also place on neuro-psych list to be seen while here.  Megan Reid 12/13/2021, 10:29 AM

## 2021-12-13 NOTE — Plan of Care (Signed)
  Problem: RH Problem Solving Goal: LTG Patient will demonstrate problem solving for (SLP) Description: LTG:  Patient will demonstrate problem solving for basic/complex daily situations with cues  (SLP) Flowsheets (Taken 12/13/2021 1155) LTG: Patient will demonstrate problem solving for (SLP): Basic daily situations LTG Patient will demonstrate problem solving for: Supervision   Problem: RH Memory Goal: LTG Patient will demonstrate ability for day to day (SLP) Description: LTG:   Patient will demonstrate ability for day to day recall/carryover during cognitive/linguistic activities with assist  (SLP) Flowsheets (Taken 12/13/2021 1155) LTG: Patient will demonstrate ability for day to day recall: New information LTG: Patient will demonstrate ability for day to day recall/carryover during cognitive/linguistic activities with assist (SLP): Supervision Goal: LTG Patient will use memory compensatory aids to (SLP) Description: LTG:  Patient will use memory compensatory aids to recall biographical/new, daily complex information with cues (SLP) Flowsheets (Taken 12/13/2021 1155) LTG: Patient will use memory compensatory aids to (SLP): Supervision

## 2021-12-13 NOTE — Progress Notes (Signed)
   12/13/21 1000  Clinical Encounter Type  Visited With Patient and family together  Visit Type Initial  Referral From Nurse  Consult/Referral To Chaplain   Per Spiritual Consult list, Chaplain visited with patient and her husband (Joe) and explained the HCPOA.  Patient shared her journey and hopes for rehab as Chaplain provided emotional and spiritual support.   Jon Gills, Resident Chaplain [phone]

## 2021-12-13 NOTE — Evaluation (Signed)
Speech Language Pathology Assessment and Plan  Patient Details  Name: Megan Reid MRN: [id number] Date of Birth: 09/11/1965  SLP Diagnosis: Cognitive Impairments  Rehab Potential: Excellent ELOS: 5-7 days   Today's Date: 12/13/2021 SLP Individual Time: 0901-1000 SLP Individual Time Calculation (min): 59 min  Hospital Problem: Principal Problem:   Toxic metabolic encephalopathy  Past Medical History:  Past Medical History:  Diagnosis Date   Arthralgia 02/14/2016   Asthma    Autoimmune disease ([facility]) 02/14/2016   Positive RF 139 CCP Negative  Positive ANA 1:640 Positive Ro Positive La Elevated ESR 105 - Sjorgen's Disease   DDD (degenerative disc disease), lumbar 02/14/2016   Fatigue 02/14/2016   Gastritis 02/14/2016   Hot flashes    Migraines 02/14/2016   Osteoarthritis of both hands 02/14/2016   Osteoarthritis of both knees 02/14/2016   Vitamin D deficiency 02/14/2016   Past Surgical History:  Past Surgical History:  Procedure Laterality Date   BREAST BIOPSY Left    ROTATOR CUFF REPAIR Right 06/16/2019   TUBAL LIGATION      Assessment / Plan / Recommendation Clinical Impression  Megan Reid is a 56 year old female with history of DDD lumbar spine, asthma, autoimmune disease, Sjogren's syndrome with recent onset of Trigeminal neuralgia which was treated with addition of tegretol 11/18/21 . Family reported that she had been tylenol and ibuprofen additionally for pain control. . She was admitted on 12/04/21 with lethargy, poor po intake, 5 day history of non bloody diarrhea and abdominal pain. She was found to have metabolic encephalopathy. Patient with history of pancytopenia and Work up negative of DRESS, tickborne illness, CMV, EBV and acute hepatitis/pancytopenia felt to secondary to with tegretol toxicity.  AKI felt to be due to ATN from acute illness/diarrhea and ibuprofen use.   PT/OT has been working with patient who continues to be limited by weakness, DOE with  increased WOB, working on EOB activity as well as standing with Stedy as LLE reported to be buckling. Also patient continues to be encephalopathic with delayed processing and needs simple commands for basic activity. CIR recommended due to functional decline. Patient transferred to CIR on 12/12/2021.  Pt presents with mild cognitive impairment as evidenced by SLUMS score of 22/30. Primary deficits noted in delayed recall, working memory and executive function. Pt reports a decline in cognitive function since acute illness/hospitalization, was working full time as a teacher is wants to regain her independence and cognitive function. Pt is very responsive to verbal and visual cues, can follow multistep directions and recall functional information from hospital stay. Pt was independent at baseline, working and completing money and medication management, as well as caring for 14 year old grandson. Pt will benefit from skilled ST with focus on cognitive goals to increase safety and independence with daily routine in discharge environment.      Skilled Therapeutic Interventions          Pt participating in SLUMS assessment and non-standardized assessment of speech, language and cognition. Please see above.   SLP Assessment  Patient will need skilled Speech Lanaguage Pathology Services during CIR admission    Recommendations  SLP Diet Recommendations: Age appropriate regular solids;Thin Liquid Administration via: Cup;Straw Medication Administration: Whole meds with liquid Supervision: Patient able to self feed Postural Changes and/or Swallow Maneuvers: Seated upright 90 degrees Oral Care Recommendations: Oral care BID Patient destination: Home Follow up Recommendations: 24 hour supervision/assistance Equipment Recommended: None recommended by SLP    SLP Frequency 3 to 5   out of 7 days   SLP Duration  SLP Intensity  SLP Treatment/Interventions 5-7 days  Minumum of 1-2 x/day, 30 to 90  minutes  Cognitive remediation/compensation;Internal/external aids;Cueing hierarchy;Functional tasks    Pain Pain Assessment Pain Scale: 0-10 Pain Score: 0-No pain  Prior Functioning Cognitive/Linguistic Baseline: Within functional limits Type of Home: House  Lives With: Spouse Available Help at Discharge: Family (daughter will be present at discharge, husband works but available when home) Vocation: Full time employment  SLP Evaluation Cognition Overall Cognitive Status: Impaired/Different from baseline Arousal/Alertness: Awake/alert Orientation Level: Oriented X4 Year: 2023 Month: August Day of Week: Correct Attention: Selective Selective Attention: Impaired Selective Attention Impairment: Verbal complex;Functional complex Memory: Impaired Memory Impairment: Decreased recall of new information Awareness: Appears intact Problem Solving: Impaired Problem Solving Impairment: Verbal complex Safety/Judgment: Appears intact Comments: min slow processing  Comprehension Auditory Comprehension Overall Auditory Comprehension: Appears within functional limits for tasks assessed Yes/No Questions: Within Functional Limits Commands: Within Functional Limits Conversation: Complex Expression Expression Primary Mode of Expression: Verbal Verbal Expression Overall Verbal Expression: Appears within functional limits for tasks assessed Written Expression Dominant Hand: Right Oral Motor Oral Motor/Sensory Function Overall Oral Motor/Sensory Function: Within functional limits Motor Speech Overall Motor Speech: Appears within functional limits for tasks assessed  Care Tool Care Tool Cognition Ability to hear (with hearing aid or hearing appliances if normally used Ability to hear (with hearing aid or hearing appliances if normally used): 0. Adequate - no difficulty in normal conservation, social interaction, listening to TV   Expression of Ideas and Wants Expression of Ideas and  Wants: 4. Without difficulty (complex and basic) - expresses complex messages without difficulty and with speech that is clear and easy to understand   Understanding Verbal and Non-Verbal Content Understanding Verbal and Non-Verbal Content: 4. Understands (complex and basic) - clear comprehension without cues or repetitions  Memory/Recall Ability Memory/Recall Ability : Current season;Location of own room;That he or she is in a hospital/hospital unit   Short Term Goals: Week 1: SLP Short Term Goal 1 (Week 1): STG = LTG d/t ELOS  Refer to Care Plan for Long Term Goals  Recommendations for other services: None   Discharge Criteria: Patient will be discharged from SLP if patient refuses treatment 3 consecutive times without medical reason, if treatment goals not met, if there is a change in medical status, if patient makes no progress towards goals or if patient is discharged from hospital.  The above assessment, treatment plan, treatment alternatives and goals were discussed and mutually agreed upon: by patient and by family  Dewaine Conger 12/13/2021, 11:32 AM

## 2021-12-13 NOTE — Progress Notes (Signed)
Physical Therapy Assessment and Plan  Patient Details  Name: Megan Reid MRN: [id number] Date of Birth: 1966/03/24  PT Diagnosis: Abnormal posture, Abnormality of gait, Difficulty walking, and Muscle weakness Rehab Potential: Good ELOS: 5-7 days   Today's Date: 12/13/2021 PT Individual Time: 1030-1130 PT Individual Time Calculation (min): 60 min    Hospital Problem: Principal Problem:   Toxic metabolic encephalopathy   Past Medical History:  Past Medical History:  Diagnosis Date   Arthralgia 02/14/2016   Asthma    Autoimmune disease ([place]) 02/14/2016   Positive RF 139 CCP Negative  Positive ANA 1:640 Positive Ro Positive [place] Elevated ESR 105 - Sjorgen's Disease   DDD (degenerative disc disease), lumbar 02/14/2016   Fatigue 02/14/2016   Gastritis 02/14/2016   Hot flashes    Migraines 02/14/2016   Osteoarthritis of both hands 02/14/2016   Osteoarthritis of both knees 02/14/2016   Vitamin D deficiency 02/14/2016   Past Surgical History:  Past Surgical History:  Procedure Laterality Date   BREAST BIOPSY Left    ROTATOR CUFF REPAIR Right 06/16/2019   TUBAL LIGATION      Assessment & Plan Clinical Impression: Patient is a 56 year old female with history of DDD lumbar spine, asthma, autoimmune disease, Sjogren's syndrome with recent onset of Trigeminal neuralgia which was treated with addition of tegretol 11/18/21 . Family reported that she had been tylenol and ibuprofen additionally for pain control. . She was admitted on 12/04/21 with lethargy, poor po intake, 5 day history of non bloody diarrhea and abdominal pain. She was found to have metabolic encephalopathy with acuter renal and hepatic failure with BUN/SCr 69/3.41, AST 9740, ALT 2902, LDH->10,000, Lipase 209, WBC-1.8, Plt-41, INR-1.4, ammonia-36 and CK-1110. CXR was negative for acute process. Acute hepatitis panel negative, she was started on IV acetylcysteine and Dr. Michail Sermon consulted for input. She developed fever past  admission, was started on IVF, blood cultures drawn and Vanc/Cefepime added due to concerns of sepsis with elevated procalcitonin. Tegretol level initially WNL. GI expressed concern for toxic ingestion and recommended trending LFTs/supportive care.    CT chest, abdomen and pelvis showed bronchial thickening keeping with airway inflammation, cholelithiasis and bilateral non-obst nephrolithiasis. Liver was unremarkable, normal kidneys and no bowel thickening or inflammatory changes noted. CT  head was negative for acute abnormality. 2D echo showed EF 60-65% with normal LVF, mild MVR and no wall abnormality. Dr. Joelyn Oms consulted and recommended supportive care with IVF and bicarb added for metabolic acidosis.  Patient with history of pancytopenia and Work up negative of DRESS, tickborne illness, CMV, EBV and acute hepatitis/pancytopenia felt to secondary to with tegretol toxicity.  AKI felt to be due to ATN from acute illness/diarrhea and ibuprofen use.    PCCM recommended 7 day course of antibiotics which were narrowed to rocephin secondary to pancytopenia. She had further drop in platelets to 11 and Dr. Alen Blew recommended transfusion for drop <10 or signs of bleeding. Neutropenia resolving from nadir of 1.5-->1.8, platelets steadily improving to 62 but hemoglobin with steady trend down to 8.0 likely due to hemodilution. Electrolyte abnormalities have been supplemented, abnormal LFTs/AKI is resolving and lethargy improving.   PT/OT has been working with patient who continues to be limited by weakness, DOE with increased WOB, working on EOB activity as well as standing with Stedy as LLE reported to be buckling. Also patient continues to be encephalopathic with delayed processing and needs simple commands for basic activity. CIR recommended due to functional decline.   Patient currently requires  min with mobility secondary to muscle weakness, decreased cardiorespiratoy endurance, impaired sensation (neuropathy),  and decreased balance strategies.  Prior to hospitalization, patient was independent  with mobility and lived with Spouse in a House home.  Home access is 4 steps with threshold to enterStairs to enter.  Patient will benefit from skilled PT intervention to maximize safe functional mobility, minimize fall risk, and decrease caregiver burden for planned discharge home with intermittent assist.  Anticipate patient will benefit from follow up OP at discharge.  PT - End of Session Activity Tolerance: Tolerates 30+ min activity with multiple rests Endurance Deficit: Yes Endurance Deficit Description: generalized deconditioning PT Assessment Rehab Potential (ACUTE/IP ONLY): Good PT Barriers to Discharge: Home environment access/layout;Insurance for SNF coverage PT Patient demonstrates impairments in the following area(s): Balance;Safety;Endurance;Motor;Sensory PT Transfers Functional Problem(s): Bed Mobility;Bed to Chair;Car PT Locomotion Functional Problem(s): Ambulation;Stairs PT Plan PT Intensity: Minimum of 1-2 x/day ,45 to 90 minutes PT Frequency: 5 out of 7 days PT Duration Estimated Length of Stay: 5-7 days PT Treatment/Interventions: Ambulation/gait training;Cognitive remediation/compensation;Discharge planning;DME/adaptive equipment instruction;Functional mobility training;Psychosocial support;Therapeutic Activities;UE/LE Strength taining/ROM;Visual/perceptual remediation/compensation;Balance/vestibular training;Community reintegration;Disease management/prevention;Functional electrical stimulation;Neuromuscular re-education;Patient/family education;Skin care/wound management;Therapeutic Exercise;Stair training;UE/LE Coordination activities PT Transfers Anticipated Outcome(s): ModI with transfers with LRAD PT Locomotion Anticipated Outcome(s): ModI with LRAD- Gait >150' PT Recommendation Follow Up Recommendations: Outpatient PT Patient destination: Home Equipment Details: TBD PT  Evaluation Precautions/Restrictions Precautions Precautions: Fall Precaution Comments: platelets <75k Restrictions Weight Bearing Restrictions: No Pain Pain Assessment Pain Scale: 0-10 Pain Score: 0-No pain Pain Interference Pain Interference Pain Effect on Sleep: 1. Rarely or not at all Pain Interference with Therapy Activities: 1. Rarely or not at all Pain Interference with Day-to-Day Activities: 1. Rarely or not at all Home Living/Prior [place]: Spouse/significant other;Children Available Help at Discharge: Family (daughter will be present at discharge, husband works but available when home) Type of Home: House Home Access: Stairs to enter [employer] of Steps: 4 steps with threshold to enter Entrance Stairs-Rails: Left (When ascending) Home Layout: One level Bathroom Shower/Tub: Chiropodist: Handicapped height Bathroom Accessibility: Yes  Lives With: Spouse Prior Function Level of Independence: Independent with basic ADLs;Independent with transfers;Independent with homemaking with ambulation;Independent with gait  Able to Take Stairs?: Yes Driving: Yes Vocation: Full time employment Vocation Requirements: Worked at [employer] during the summers and then 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 Perception Perception: Within Functional Limits Praxis Praxis: Intact  Cognition Overall Cognitive  Status: Impaired/Different from baseline Arousal/Alertness: Awake/alert Orientation Level: Oriented X4 Year: 2023 Month: August Day of Week: Correct Attention: Selective Selective Attention: Impaired Selective Attention Impairment: Verbal complex;Functional complex Memory: Impaired Memory Impairment: Decreased recall of new information Awareness: Appears intact Problem Solving: Impaired Problem Solving Impairment: Verbal complex Safety/Judgment: Appears intact Comments: min slow processing Sensation Sensation Light Touch: Appears Intact Hot/Cold: Appears Intact Additional Comments: Patient reporting a history of neuropathy and was to start therapy prior to coming into the hospital and insurance would not cover outpatient therapy Coordination Gross Motor Movements are Fluid and Coordinated: Yes Fine Motor Movements are Fluid and  Coordinated: Yes Finger Nose Finger Test: [facility] Motor  Motor Motor: Within Functional Limits Motor - Skilled Clinical Observations: Generalized weakness   Trunk/Postural Assessment  Cervical Assessment Cervical Assessment: Within Functional Limits Thoracic Assessment Thoracic Assessment: Exceptions to [facility] (Patient with scoliosis with convexity to the left causing a R lean) Lumbar Assessment Lumbar Assessment: Exceptions to [facility] Postural Control Postural Control: Deficits on evaluation Righting Reactions: Delayed  Balance Balance Balance Assessed: Yes Standardized Balance Assessment Standardized Balance Assessment: Berg Balance Test Berg Balance Test Sit to Stand: Able to stand using hands after several tries Standing Unsupported: Able to stand 2 minutes with supervision Sitting with Back Unsupported but Feet Supported on Floor or Stool: Able to sit safely and securely 2 minutes Stand to Sit: Controls descent by using hands Transfers: Able to transfer with verbal cueing and /or supervision Standing Unsupported with Eyes Closed: Able to stand 10  seconds with supervision Standing Ubsupported with Feet Together: Able to place feet together independently but unable to hold for 30 seconds From Standing Position, Pick up Object from Floor: Able to pick up shoe, needs supervision Standing Unsupported, One Foot in Front: Needs help to step but can hold 15 seconds Standing on One Leg: Tries to lift leg/unable to hold 3 seconds but remains standing independently Static Sitting Balance Static Sitting - Balance Support: Feet supported Static Sitting - Level of Assistance: 7: Independent Dynamic Sitting Balance Dynamic Sitting - Balance Support: During functional activity Dynamic Sitting - Level of Assistance: 5: Stand by assistance Static Standing Balance Static Standing - Balance Support: During functional activity Static Standing - Level of Assistance: 5: Stand by assistance Dynamic Standing Balance Dynamic Standing - Balance Support: During functional activity Dynamic Standing - Level of Assistance: 4: Min assist Dynamic Standing - Comments: Delayed balance strategies Extremity Assessment  RUE Assessment RUE Assessment: Exceptions to [facility] General Strength Comments: Mild weakness, 4/5 LUE Assessment LUE Assessment: Exceptions to [facility] General Strength Comments: Mild weakness, 4/5 RLE Assessment RLE Assessment: Within Functional Limits General Strength Comments: LE strength assessed with functional mobility- R LE grossly 4/5 LLE Assessment LLE Assessment: Within Functional Limits General Strength Comments: LE strength assessed with functional mobility- L LE grossly 4/5  Care Tool Care Tool Bed Mobility Roll left and right activity   Roll left and right assist level: Independent    Sit to lying activity   Sit to lying assist level: Independent    Lying to sitting on side of bed activity   Lying to sitting on side of bed assist level: the ability to move from lying on the back to sitting on the side of the bed with no back  support.: Independent     Care Tool Transfers Sit to stand transfer   Sit to stand assist level: Contact Guard/Touching assist    Chair/bed transfer   Chair/bed transfer assist level: Contact Guard/Touching assist     Toilet transfer   Assist Level: Contact Guard/Touching assist    Car transfer   Car transfer assist level: Contact Guard/Touching assist      Care Tool Locomotion Ambulation   Assist level: Minimal Assistance - Patient > 75% Assistive device: No Device Max distance: >150'  Walk 10 feet activity   Assist level: Contact Guard/Touching assist Assistive device: No Device   Walk 50 feet with 2 turns activity   Assist level: Minimal Assistance - Patient > 75% Assistive device: No Device  Walk 150 feet activity   Assist level: Minimal Assistance - Patient > 75%  Assistive device: No Device  Walk 10 feet on uneven surfaces activity   Assist level: Minimal Assistance - Patient > 75% Assistive device: Other (comment) (No AD)  Stairs   Assist level: Minimal Assistance - Patient > 75% Stairs assistive device: 1 hand rail Max number of stairs: 12  Walk up/down 1 step activity   Walk up/down 1 step (curb) assist level: Minimal Assistance - Patient > 75% Walk up/down 1 step or curb assistive device: 1 hand rail  Walk up/down 4 steps activity   Walk up/down 4 steps assist level: Minimal Assistance - Patient > 75% Walk up/down 4 steps assistive device: 1 hand rail  Walk up/down 12 steps activity   Walk up/down 12 steps assist level: Minimal Assistance - Patient > 75% Walk up/down 12 steps assistive device: 1 hand rail  Pick up small objects from floor   Pick up small object from the floor assist level: Contact Guard/Touching assist Pick up small object from the floor assistive device: No AD  Wheelchair Is the patient using a wheelchair?: No          Wheel 50 feet with 2 turns activity      Wheel 150 feet activity        Refer to Care Plan for Long Term  Goals  SHORT TERM GOAL WEEK 1 PT Short Term Goal 1 (Week 1): STG=LTG due to ELOS  Recommendations for other services: Neuropsych  Skilled Therapeutic Intervention Patient greeted supine in bed with husband present and agreeable to PT treatment session. Patient with no reports of pain at this time. Patient performed all functional mobility tasks without the use of an AD. CGA required for sit/stands, transfers and gait <50 feet. Patient required MinA for gait >50' secondary to mild LOBs when turning requiring steadying assistance. Patient ascended/descended 12 steps with L HR and CGA while ascending, however required MinA while descending secondary to poor eccentric control. Initiated BERG balance assessment with patient, however unable to complete at this time secondary to impaired endurance/activity tolerance. Patient required frequent extended seated rest breaks throughout session with verbal cues for pursed lip breathing.    Patient returned to her room sitting in bedside recliner with call bell within reach, posey belt on, all needs met and RN present in room. Safety plan updated prior to therapist leaving room.    Discharge Criteria: Patient will be discharged from PT if patient refuses treatment 3 consecutive times without medical reason, if treatment goals not met, if there is a change in medical status, if patient makes no progress towards goals or if patient is discharged from hospital.  The above assessment, treatment plan, treatment alternatives and goals were discussed and mutually agreed upon: by patient and by family  Dominica  Wisdom Rickey 12/13/2021, 11:54 AM

## 2021-12-13 NOTE — Evaluation (Signed)
Occupational Therapy Assessment and Plan  Patient Details  Name: Megan Reid MRN: [id number] Date of Birth: 31-May-1965  OT Diagnosis: cognitive deficits and muscle weakness (generalized) Rehab Potential: Rehab Potential (ACUTE ONLY): Good ELOS: 5-7 days   Today's Date: 12/13/2021 OT Individual Time: 6468-0321 OT Individual Time Calculation (min): 73 min     Hospital Problem: Principal Problem:   Toxic metabolic encephalopathy   Past Medical History:  Past Medical History:  Diagnosis Date   Arthralgia 02/14/2016   Asthma    Autoimmune disease ([place]) 02/14/2016   Positive RF 139 CCP Negative  Positive ANA 1:640 Positive Ro Positive [place] Elevated ESR 105 - Sjorgen's Disease   DDD (degenerative disc disease), lumbar 02/14/2016   Fatigue 02/14/2016   Gastritis 02/14/2016   Hot flashes    Migraines 02/14/2016   Osteoarthritis of both hands 02/14/2016   Osteoarthritis of both knees 02/14/2016   Vitamin D deficiency 02/14/2016   Past Surgical History:  Past Surgical History:  Procedure Laterality Date   BREAST BIOPSY Left    ROTATOR CUFF REPAIR Right 06/16/2019   TUBAL LIGATION      Assessment & Plan Clinical Impression: Megan Reid is a 56 year old female with history of DDD lumbar spine, asthma, autoimmune disease, Sjogren's syndrome with recent onset of Trigeminal neuralgia which was treated with addition of tegretol 11/18/21 . Family reported that she had been tylenol and ibuprofen additionally for pain control. . She was admitted on 12/04/21 with lethargy, poor po intake, 5 day history of non bloody diarrhea and abdominal pain. She was found to have metabolic encephalopathy with acuter renal and hepatic failure with BUN/SCr 69/3.41, AST 9740, ALT 2902, LDH->10,000, Lipase 209, WBC-1.8, Plt-41, INR-1.4, ammonia-36 and CK-1110. CXR was negative for acute process. Acute hepatitis panel negative, she was started on IV acetylcysteine and Dr. Michail Sermon consulted for input. She  developed fever past admission, was started on IVF, blood cultures drawn and Vanc/Cefepime added due to concerns of sepsis with elevated procalcitonin. Tegretol level initially WNL. GI expressed concern for toxic ingestion and recommended trending LFTs/supportive care.    CT chest, abdomen and pelvis showed bronchial thickening keeping with airway inflammation, cholelithiasis and bilateral non-obst nephrolithiasis. Liver was unremarkable, normal kidneys and no bowel thickening or inflammatory changes noted. CT  head was negative for acute abnormality. 2D echo showed EF 60-65% with normal LVF, mild MVR and no wall abnormality. Dr. Joelyn Oms consulted and recommended supportive care with IVF and bicarb added for metabolic acidosis.  Patient with history of pancytopenia and Work up negative of DRESS, tickborne illness, CMV, EBV and acute hepatitis/pancytopenia felt to secondary to with tegretol toxicity.  AKI felt to be due to ATN from acute illness/diarrhea and ibuprofen use.    PCCM recommended 7 day course of antibiotics which were narrowed to rocephin secondary to pancytopenia. She had further drop in platelets to 11 and Dr. Alen Blew recommended transfusion for drop <10 or signs of bleeding. Neutropenia resolving from nadir of 1.5-->1.8, platelets steadily improving to 62 but hemoglobin with steady trend down to 8.0 likely due to hemodilution. Electrolyte abnormalities have been supplemented, abnormal LFTs/AKI is resolving and lethargy improving.   PT/OT has been working with patient who continues to be limited by weakness, DOE with increased WOB, working on EOB activity as well as standing with Stedy as LLE reported to be buckling. Also patient continues to be encephalopathic with delayed processing and needs simple commands for basic activity. CIR recommended due to functional decline. Patient transferred  to CIR on 12/12/2021 .    Patient currently requires min with basic self-care skills secondary to muscle  weakness, decreased cardiorespiratoy endurance, decreased memory and delayed processing, and decreased standing balance and decreased balance strategies.  Prior to hospitalization, patient could complete ADLs with independent .  Patient will benefit from skilled intervention to decrease level of assist with basic self-care skills prior to discharge home with care partner.  Anticipate patient will require intermittent supervision and follow up outpatient.  OT - End of Session Activity Tolerance: Tolerates 10 - 20 min activity with multiple rests Endurance Deficit: Yes Endurance Deficit Description: generalized deconditioning OT Assessment Rehab Potential (ACUTE ONLY): Good OT Patient demonstrates impairments in the following area(s): Balance;Endurance;Motor;Cognition;Sensory OT Basic ADL's Functional Problem(s): Bathing;Dressing;Toileting OT Transfers Functional Problem(s): Toilet;Tub/Shower OT Additional Impairment(s): None OT Plan OT Intensity: Minimum of 1-2 x/day, 45 to 90 minutes OT Frequency: 5 out of 7 days OT Duration/Estimated Length of Stay: 5-7 days OT Treatment/Interventions: Balance/vestibular training;Discharge planning;Pain management;Self Care/advanced ADL retraining;Therapeutic Activities;UE/LE Coordination activities;Cognitive remediation/compensation;Disease mangement/prevention;Functional mobility training;Patient/family education;Therapeutic Exercise;DME/adaptive equipment instruction;Psychosocial support;UE/LE Strength taining/ROM;Community reintegration OT Self Feeding Anticipated Outcome(s): no goal OT Basic Self-Care Anticipated Outcome(s): mod I OT Toileting Anticipated Outcome(s): mod I OT Bathroom Transfers Anticipated Outcome(s): mod I OT Recommendation Patient destination: Home Follow Up Recommendations: Outpatient OT Equipment Recommended: To be determined   OT Evaluation Precautions/Restrictions  Precautions Precautions: Fall Precaution Comments:  platelets <75k Restrictions Weight Bearing Restrictions: No General Chart Reviewed: Yes Family/Caregiver Present: Yes  Pain Pain Assessment Pain Scale: 0-10 Pain Score: 0-No pain Home Living/Prior Functioning Home Living Family/patient expects to be discharged to:: Private residence Living Arrangements: Spouse/significant other Available Help at Discharge: Family Type of Home: House Home Access: Stairs to enter Technical brewer of Steps: 5 Entrance Stairs-Rails: Right Home Layout: One level Bathroom Shower/Tub: Optometrist: Yes  Lives With: Spouse IADL History Homemaking Responsibilities: Yes Meal Prep Responsibility: Primary Laundry Responsibility: Primary Cleaning Responsibility: Primary Bill Paying/Finance Responsibility: Primary Current License: Yes Mode of Transportation: Car Occupation: Full time employment Prior Function Level of Independence: Independent with basic ADLs, Independent with homemaking with wheelchair, Independent with gait Driving: Yes Vocation: Full time employment Vision Baseline Vision/History: 0 No visual deficits Ability to See in Adequate Light: 0 Adequate Patient Visual Report: Blurring of vision Vision Assessment?: Yes Eye Alignment: Within Functional Limits Ocular Range of Motion: Within Functional Limits Alignment/Gaze Preference: Within Defined Limits Tracking/Visual Pursuits: Able to track stimulus in all quads without difficulty Saccades: Within functional limits Visual Fields: No apparent deficits Additional Comments: Possibly some mild peripheral deficits but overall just reports blurred vision. Husband states this is a side effect of the medication. Perception  Perception: Within Functional Limits Praxis Praxis: Intact Cognition Cognition Overall Cognitive Status: Impaired/Different from baseline Arousal/Alertness: Awake/alert Orientation Level:  Person;Place;Situation Person: Oriented Place: Oriented Situation: Oriented Memory: Impaired Memory Impairment: Decreased recall of new information Attention: Selective Selective Attention: Impaired Selective Attention Impairment: Verbal complex;Functional complex Awareness: Appears intact Problem Solving: Appears intact Safety/Judgment: Appears intact Comments: Slower processing Brief Interview for Mental Status (BIMS) Repetition of Three Words (First Attempt): 3 Temporal Orientation: Year: Correct Temporal Orientation: Month: Accurate within 5 days Temporal Orientation: Day: Incorrect Recall: "Sock": No, could not recall Recall: "Blue": Yes, no cue required Recall: "Bed": Yes, no cue required BIMS Summary Score: 12 Sensation Sensation Light Touch: Appears Intact Hot/Cold: Appears Intact Additional Comments: Reports hx of neuropathy with some tingling but no sensory changes otherwise Coordination Gross Motor Movements  are Fluid and Coordinated: Yes Fine Motor Movements are Fluid and Coordinated: Yes Finger Nose Finger Test: [facility] Motor  Motor Motor: Within Functional Limits Motor - Skilled Clinical Observations: Generalized weakness  Trunk/Postural Assessment  Cervical Assessment Cervical Assessment: Within Functional Limits Thoracic Assessment Thoracic Assessment: Within Functional Limits Lumbar Assessment Lumbar Assessment: Within Functional Limits Postural Control Postural Control: Deficits on evaluation Righting Reactions: delayed  Balance   Extremity/Trunk Assessment RUE Assessment RUE Assessment: Exceptions to [facility] General Strength Comments: Mild weakness, 4/5 LUE Assessment LUE Assessment: Exceptions to [facility] General Strength Comments: Mild weakness, 4/5  Care Tool Care Tool Self Care Eating   Eating Assist Level: Set up assist    Oral Care    Oral Care Assist Level: Supervision/Verbal cueing    Bathing   Body parts bathed by patient: Right arm;Left  arm;Right lower leg;Left lower leg;Chest;Face;Abdomen;Front perineal area;Buttocks;Right upper leg;Left upper leg     Assist Level: Contact Guard/Touching assist    Upper Body Dressing(including orthotics)   What is the patient wearing?: Pull over shirt;Bra   Assist Level: Supervision/Verbal cueing    Lower Body Dressing (excluding footwear)   What is the patient wearing?: Underwear/pull up;Pants Assist for lower body dressing: Contact Guard/Touching assist    Putting on/Taking off footwear   What is the patient wearing?: Non-skid slipper socks Assist for footwear: Supervision/Verbal cueing       Care Tool Toileting Toileting activity   Assist for toileting: Contact Guard/Touching assist     Care Tool Bed Mobility Roll left and right activity   Roll left and right assist level: Supervision/Verbal cueing    Sit to lying activity Sit to lying activity did not occur: Safety/medical concerns Sit to lying assist level: Supervision/Verbal cueing    Lying to sitting on side of bed activity Lying to sitting on side of bed activity did not occur: the ability to move from lying on the back to sitting on the side of the bed with no back support.: Safety/medical concerns Lying to sitting on side of bed assist level: the ability to move from lying on the back to sitting on the side of the bed with no back support.: Supervision/Verbal cueing     Care Tool Transfers Sit to stand transfer Sit to stand activity did not occur: Safety/medical concerns Sit to stand assist level: Contact Guard/Touching assist    Chair/bed transfer Chair/bed transfer activity did not occur: Safety/medical concerns Chair/bed transfer assist level: Contact Guard/Touching assist     Toilet transfer Toilet transfer activity did not occur: Safety/medical concerns Assist Level: Contact Guard/Touching assist     Care Tool Cognition  Expression of Ideas and Wants Expression of Ideas and Wants: 4. Without difficulty  (complex and basic) - expresses complex messages without difficulty and with speech that is clear and easy to understand  Understanding Verbal and Non-Verbal Content Understanding Verbal and Non-Verbal Content: 4. Understands (complex and basic) - clear comprehension without cues or repetitions   Memory/Recall Ability Memory/Recall Ability : Current season;Location of own room;That he or she is in a hospital/hospital unit   Refer to Care Plan for [place] 1 OT Short Term Goal 1 (Week 1): STG= LTG d/t ELOS  Recommendations for other services: None    Skilled Therapeutic Intervention ADL ADL Eating: Supervision/safety Where Assessed-Eating: Chair Grooming: Contact guard Where Assessed-Grooming: Standing at sink Upper Body Bathing: Supervision/safety Where Assessed-Upper Body Bathing: Shower Lower Body Bathing: Contact guard Where Assessed-Lower Body Bathing: Shower Upper  Body Dressing: Supervision/safety Where Assessed-Upper Body Dressing: Edge of bed Lower Body Dressing: Contact guard Where Assessed-Lower Body Dressing: Edge of bed Toileting: Contact guard Where Assessed-Toileting: Glass blower/designer: Therapist, music Method: Human resources officer: Minimal Museum/gallery conservator Method: Optometrist: Civil engineer, contracting with back Nutritional therapist Method: Heritage manager: Civil engineer, contracting with back Mobility  Bed Mobility Bed Mobility: Sit to Supine;Supine to Sit Supine to Sit: Supervision/Verbal cueing Sit to Supine: Supervision/Verbal cueing Transfers Sit to Stand: Contact Guard/Touching assist Stand to Sit: Contact Guard/Touching assist   Skilled OT evaluation completed with the creation of pt centered OT POC. Pt educated on condition, ELOS, rehab expectations, and fall risk reduction strategies throughout session. Pt's husband present  throughout session and very supportive. Pt able to complete ADLs and transfers as described above. Challenged pt to complete 120 ft of functional mobility without the RW- she required CGA overall with no overt LOB, deconditioned and required rest break following. Pt in great spirits and motivated to return to PLOF and work. Anticipate short LOS and mod I goals. Pt left EOB with husband present, all needs met.     Discharge Criteria: Patient will be discharged from OT if patient refuses treatment 3 consecutive times without medical reason, if treatment goals not met, if there is a change in medical status, if patient makes no progress towards goals or if patient is discharged from hospital.  The above assessment, treatment plan, treatment alternatives and goals were discussed and mutually agreed upon: by patient and by family  Curtis Sites 12/13/2021, 9:01 AM

## 2021-12-13 NOTE — Progress Notes (Signed)
Inpatient Rehabilitation Center Individual Statement of Services  Patient Name:  Megan Reid  Date:  12/13/2021  Welcome to the Inpatient Rehabilitation Center.  Our goal is to provide you with an individualized program based on your diagnosis and situation, designed to meet your specific needs.  With this comprehensive rehabilitation program, you will be expected to participate in at least 3 hours of rehabilitation therapies Monday-Friday, with modified therapy programming on the weekends.  Your rehabilitation program will include the following services:  Physical Therapy (PT), Occupational Therapy (OT), Speech Therapy (ST), 24 hour per day rehabilitation nursing, Neuropsychology, Care Coordinator, Rehabilitation Medicine, Nutrition Services, and Pharmacy Services  Weekly team conferences will be held on Wednesday to discuss your progress.  Your Inpatient Rehabilitation Care Coordinator will talk with you frequently to get your input and to update you on team discussions.  Team conferences with you and your family in attendance may also be held.  Expected length of stay: 5-7 days  Overall anticipated outcome: mod/I level  Depending on your progress and recovery, your program may change. Your Inpatient Rehabilitation Care Coordinator will coordinate services and will keep you informed of any changes. Your Inpatient Rehabilitation Care Coordinator's name and contact numbers are listed  below.  The following services may also be recommended but are not provided by the Inpatient Rehabilitation Center:  Driving Evaluations Home Health Rehabiltiation Services Outpatient Rehabilitation Services Vocational Rehabilitation   Arrangements will be made to provide these services after discharge if needed.  Arrangements include referral to agencies that provide these services.  Your insurance has been verified to be:  [employer] Your primary doctor is:  Therapist, sports   Pertinent information  will be shared with your doctor and your insurance company.  Inpatient Rehabilitation Care Coordinator:  Dossie Der, [place] [phone] or Luna Glasgow  Information discussed with and copy given to patient by: Lucy Chris, 12/13/2021, 10:31 AM

## 2021-12-13 NOTE — Progress Notes (Addendum)
Patient ID: Megan Reid, female   DOB: 01/27/1966, 56 y.o.   MRN: [id number] Elouise Munroe informed told by Janet-CM[employer] pt's insurance will term 8/31. 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 She reports have the pt or husband call HR at the school to get ID number then she can enter it into the system. Have given information to husband present in her room he will give to her when she returns from therapy. If can get new ID number can call janet so she can update in system  3:06 PM Pt has gotten her new ID number it is [id number] have called janet-CM [employer] to give this information to.

## 2021-12-13 NOTE — Progress Notes (Signed)
PROGRESS NOTE   Subjective/Complaints:  Pt seen and examined at bedside with husband. Did not sleep overnight due to distress regarding hospitalization and organizing work leave for [employer]. Otherwise, doing well, no acute complaints.   ROS: Denies fevers, chills, N/V, abdominal pain, constipation, diarrhea, SOB, cough, chest pain, new weakness or paraesthesias.    Objective:   No results found. Recent Labs    12/12/21 0339 12/13/21 0520  WBC 1.8* 3.1*  HGB 8.0* 7.8*  HCT 24.5* 23.8*  PLT 62* 114*   Recent Labs    12/12/21 0339 12/13/21 0520  NA 137 140  K 4.0 3.9  CL 110 109  CO2 23 24  GLUCOSE 99 96  BUN 38* 30*  CREATININE 1.51* 1.37*  CALCIUM 8.1* 8.3*    Intake/Output Summary (Last 24 hours) at 12/13/2021 0959 Last data filed at 12/13/2021 0820 Gross per 24 hour  Intake 360 ml  Output --  Net 360 ml        Physical Exam: Vital Signs Blood pressure 127/80, pulse (!) 106, temperature 98.4 F (36.9 C), temperature source Oral, resp. rate 20, height 5\' 10"  (1.778 m), weight 73.2 kg, last menstrual period 05/22/2012, SpO2 97 %. Constitutional: No apparent distress. Appropriate appearance for age.  HENT: No JVD. Neck Supple. Trachea midline. Atraumatic, normocephalic. Eyes: PERRLA. EOMI. Visual fields grossly intact.  Cardiovascular: RRR, no murmurs/rub/gallops. No Edema. Peripheral pulses 2+  Respiratory: CTAB. No rales, rhonchi, or wheezing. On RA.  Abdomen: + bowel sounds, normoactive. No distention or tenderness.  GU: Not examined. Skin: C/D/I. No apparent lesions. MSK:      No apparent deformity.      Strength:                RUE: 5/5 SA, 5/5 EF, 5/5 EE, 5/5 WE, 5/5 FF, 5/5 FA                 LUE: 5/5 SA, 5/5 EF, 5/5 EE, 5/5 WE, 5/5 FF, 5/5 FA                 RLE: 5/5 HF, 5/5 KE, 5/5 DF, 5/5 EHL, 5/5 PF                 LLE:  5/5 HF, 5/5 KE, 5/5 DF, 5/5 EHL, 5/5 PF   Neurologic  exam:  Cognition: AAO to person, place, time and event.  Language: Fluent, No substitutions or neoglisms. No dysarthria. Names 3/3 objects correctly.  Memory: Recalls 0/3 objects at 5 minutes, with cues.  Insight: Good insight into current condition.  Mood: Pleasant affect, appropriate mood.  CN: 2-12 grossly intact.  Coordination: No apparent tremors. No ataxia on FTN, HTS bilaterally.  Spasticity: MAS 0 in all extremities.         Assessment/Plan: 1. Functional deficits which require 3+ hours per day of interdisciplinary therapy in a comprehensive inpatient rehab setting. Physiatrist is providing close team supervision and 24 hour management of active medical problems listed below. Physiatrist and rehab team continue to assess barriers to discharge/monitor patient progress toward functional and medical goals  Care Tool:  Bathing    Body parts bathed by patient:  Right arm, Left arm, Right lower leg, Left lower leg, Chest, Face, Abdomen, Front perineal area, Buttocks, Right upper leg, Left upper leg         Bathing assist Assist Level: Contact Guard/Touching assist     Upper Body Dressing/Undressing Upper body dressing   What is the patient wearing?: Pull over shirt, Bra    Upper body assist Assist Level: Supervision/Verbal cueing    Lower Body Dressing/Undressing Lower body dressing      What is the patient wearing?: Underwear/pull up, Pants     Lower body assist Assist for lower body dressing: Contact Guard/Touching assist     Toileting Toileting    Toileting assist Assist for toileting: Contact Guard/Touching assist     Transfers Chair/bed transfer  Transfers assist  Chair/bed transfer activity did not occur: Safety/medical concerns  Chair/bed transfer assist level: Contact Guard/Touching assist     Locomotion Ambulation   Ambulation assist              Walk 10 feet activity   Assist           Walk 50 feet activity   Assist            Walk 150 feet activity   Assist           Walk 10 feet on uneven surface  activity   Assist           Wheelchair     Assist               Wheelchair 50 feet with 2 turns activity    Assist            Wheelchair 150 feet activity     Assist          Blood pressure 127/80, pulse (!) 106, temperature 98.4 F (36.9 C), temperature source Oral, resp. rate 20, height 5\' 10"  (1.778 m), weight 73.2 kg, last menstrual period 05/22/2012, SpO2 97 %.  Medical Problem List and Plan: 1. Functional deficits secondary to debility secondary to SIRS             -patient may  shower             -ELOS/Goals: 7-10d, Mod I ADL and mobility  2.  Antithrombotics: -DVT/anticoagulation:  Mechanical: Sequential compression devices, entire leg Bilateral lower extremities             -antiplatelet therapy: N/A 3. Pain Management: Local measures for now.  4. Mood/Behavior/Sleep: LCSW to follow for evaluation and support.              -antipsychotic agents: N/A            - Pt endorses poor sleep d/t racing thoughts, hospitalization; Trazadone 25-50 mg PRN added  5. Neuropsych/cognition: This patient is not fully capable of making decisions on her own behalf. 6. Skin/Wound Care: Routine pressure relief measures.  7. Fluids/Electrolytes/Nutrition: Continue to monitor renal status/LFTs closely.              --recheck CBC/CMET in am - CMET stable 8. Sjogren's: Treated with Xolair every 14 days.  9. Abnormal LFTs: Much improved. AST 240, ALT 313, TB 1.4. INR- 1.2             --ammonia level 36-->53-->37--> 41 8/29, stable   -- Recheck LFTs --> downtrending 10 AKI: Resolving. BUN/SCR improved from 95/4.3-->38/1.51 --> 30/1.37 --Imporoving 11. Acute on chronic thrombocytopenia: Improving overall--followed by Dr. Clelia Croft.               --  Intermittent hematuria documented 8/25, 8/26, 8/27              --Labs below pre-admit-->admit--Nadir-->current  admit --Platelets (121)  41---> 11--->39--> 114; improving --WBC (4.0) 1.8--->1.5-->1.6 --> 3.1; improving --Hgb (11.6)  13.6-->8.2 --> 7.8; recheck CBC and FOBT in AM --Pancytopenia improving -- Request maintenance pneumonia vaccination be held until OP f/u, in setting of pancytopenia   12. HTN: Monitor BP TID. Continue to hold HCTZ.             --on metoprolol BID-->question affecting respiratory status. No wheezing on exam today   13. H/o Asthma: On singulair. Resume MDI as SOB/increased WOB reported with minimal activity.   14. Constipation: Will start Miralax every other day.                - LBM 8/29 AM.     LOS: 1 days A FACE TO FACE EVALUATION WAS PERFORMED  Angelina Sheriff 12/13/2021, 9:59 AM

## 2021-12-13 NOTE — Plan of Care (Signed)
  Problem: Sit to Stand Goal: LTG:  Patient will perform sit to stand with assistance level (PT) Description: LTG:  Patient will perform sit to stand with assistance level (PT) Flowsheets (Taken 12/13/2021 1145) LTG: PT will perform sit to stand in preparation for functional mobility with assistance level: Independent   Problem: RH Bed to Chair Transfers Goal: LTG Patient will perform bed/chair transfers w/assist (PT) Description: LTG: Patient will perform bed to chair transfers with assistance (PT). Flowsheets (Taken 12/13/2021 1145) LTG: Pt will perform Bed to Chair Transfers with assistance level: Independent   Problem: RH Car Transfers Goal: LTG Patient will perform car transfers with assist (PT) Description: LTG: Patient will perform car transfers with assistance (PT). Flowsheets (Taken 12/13/2021 1145) LTG: Pt will perform car transfers with assist:: Independent   Problem: RH Ambulation Goal: LTG Patient will ambulate in controlled environment (PT) Description: LTG: Patient will ambulate in a controlled environment, # of feet with assistance (PT). Flowsheets (Taken 12/13/2021 1145) LTG: Pt will ambulate in controlled environ  assist needed:: Independent LTG: Ambulation distance in controlled environment: 100' Goal: LTG Patient will ambulate in home environment (PT) Description: LTG: Patient will ambulate in home environment, # of feet with assistance (PT). Flowsheets (Taken 12/13/2021 1145) LTG: Pt will ambulate in home environ  assist needed:: Independent LTG: Ambulation distance in home environment: 50' Goal: LTG Patient will ambulate in community environment (PT) Description: LTG: Patient will ambulate in community environment, # of feet with assistance (PT). Flowsheets (Taken 12/13/2021 1145) LTG: Pt will ambulate in community environ  assist needed:: Independent with assistive device LTG: Ambulation distance in community environment: 150'   Problem: RH Stairs Goal: LTG  Patient will ambulate up and down stairs w/assist (PT) Description: LTG: Patient will ambulate up and down # of stairs with assistance (PT) Flowsheets (Taken 12/13/2021 1145) LTG: Pt will ambulate up/down stairs assist needed:: Independent with assistive device LTG: Pt will  ambulate up and down number of stairs: 12 with S HR

## 2021-12-13 NOTE — Progress Notes (Signed)
Inpatient Rehabilitation  Patient information reviewed and entered into eRehab system by Debra Calabretta M. Jennafer Gladue, M.A., CCC/SLP, PPS Coordinator.  Information including medical coding, functional ability and quality indicators will be reviewed and updated through discharge.    

## 2021-12-14 LAB — CBC WITH DIFFERENTIAL/PLATELET
Abs Immature Granulocytes: 0.02 10*3/uL (ref 0.00–0.07)
Basophils Absolute: 0 10*3/uL (ref 0.0–0.1)
Basophils Relative: 1 %
Eosinophils Absolute: 0 10*3/uL (ref 0.0–0.5)
Eosinophils Relative: 0 %
HCT: 23.8 % — ABNORMAL LOW (ref 36.0–46.0)
Hemoglobin: 7.8 g/dL — ABNORMAL LOW (ref 12.0–15.0)
Immature Granulocytes: 1 %
Lymphocytes Relative: 31 %
Lymphs Abs: 1 10*3/uL (ref 0.7–4.0)
MCH: 28 pg (ref 26.0–34.0)
MCHC: 32.8 g/dL (ref 30.0–36.0)
MCV: 85.3 fL (ref 80.0–100.0)
Monocytes Absolute: 0.4 10*3/uL (ref 0.1–1.0)
Monocytes Relative: 13 %
Neutro Abs: 1.7 10*3/uL (ref 1.7–7.7)
Neutrophils Relative %: 54 %
Platelets: 114 10*3/uL — ABNORMAL LOW (ref 150–400)
RBC: 2.79 MIL/uL — ABNORMAL LOW (ref 3.87–5.11)
RDW: 14.6 % (ref 11.5–15.5)
WBC: 3.1 10*3/uL — ABNORMAL LOW (ref 4.0–10.5)
nRBC: 0 % (ref 0.0–0.2)

## 2021-12-14 LAB — CBC
HCT: 23.2 % — ABNORMAL LOW (ref 36.0–46.0)
Hemoglobin: 7.6 g/dL — ABNORMAL LOW (ref 12.0–15.0)
MCH: 28.3 pg (ref 26.0–34.0)
MCHC: 32.8 g/dL (ref 30.0–36.0)
MCV: 86.2 fL (ref 80.0–100.0)
Platelets: 182 10*3/uL (ref 150–400)
RBC: 2.69 MIL/uL — ABNORMAL LOW (ref 3.87–5.11)
RDW: 14.9 % (ref 11.5–15.5)
WBC: 3.1 10*3/uL — ABNORMAL LOW (ref 4.0–10.5)
nRBC: 0 % (ref 0.0–0.2)

## 2021-12-14 MED ORDER — LIDOCAINE 5 % EX PTCH
1.0000 | MEDICATED_PATCH | CUTANEOUS | Status: DC
  Administered 2021-12-14: 1 via TRANSDERMAL
  Filled 2021-12-14 (×4): qty 1

## 2021-12-14 MED ORDER — HYDROXYZINE HCL 10 MG PO TABS
10.0000 mg | ORAL_TABLET | Freq: Three times a day (TID) | ORAL | Status: DC | PRN
Start: 2021-12-14 — End: 2021-12-18
  Filled 2021-12-14: qty 1

## 2021-12-14 MED ORDER — MELATONIN 5 MG PO TABS
5.0000 mg | ORAL_TABLET | Freq: Every evening | ORAL | Status: DC | PRN
Start: 2021-12-14 — End: 2021-12-18

## 2021-12-14 MED ORDER — MAGNESIUM CHLORIDE 64 MG PO TBEC
1.0000 | DELAYED_RELEASE_TABLET | Freq: Every day | ORAL | Status: DC
  Administered 2021-12-14 – 2021-12-16 (×3): 64 mg via ORAL
  Filled 2021-12-14 (×3): qty 1

## 2021-12-14 NOTE — Progress Notes (Signed)
Patient ID: Megan Reid, female   DOB: 12/21/1965, 56 y.o.   MRN: [id number] Met with pt and husband came in while there to update regarding team conference goals of independent level and discharge date 9/3. Both pleased with her progress and pt somewhat anxious regarding discharge and wanting to be sure she is ready to go home. Discussed OP being recommended and they live closer to [place] so will make referral to this OP Neuro-rehab. Will continue to work on discharge needs. Pam-PA has FMLA paperwork to be completed for pt 

## 2021-12-14 NOTE — Progress Notes (Signed)
Speech Language Pathology Daily Session Note  Patient Details  Name: Megan Reid MRN: [id number] Date of Birth: 03-10-1966  Today's Date: 12/14/2021 SLP Individual Time: 0915-1015 SLP Individual Time Calculation (min): 60 min  Short Term Goals: Week 1: SLP Short Term Goal 1 (Week 1): STG = LTG d/t ELOS  Skilled Therapeutic Interventions:Skilled ST services focused on cognitive skills. SLP facilitated medication management and recall strategies with personal medication utilizing BID Pill organizer. Patient demonstrated mod I for verbal and functional problem-solving. SLP educated patient on recall strategies, chunking and WRAP. Patient required min A fade to supervision A verbal cues for recall of medication, name, function, and times per day. Patient was left in room with call bell within reach and chair alarm set. Recommend continuing ST services.    Pain Pain Assessment Pain Score: 0-No pain  Therapy/Group: Individual Therapy  Lottie Siska  [facility] 12/14/2021, 2:27 PM

## 2021-12-14 NOTE — Progress Notes (Signed)
Occupational Therapy Session Note  Patient Details  Name: Megan Reid MRN: [id number] Date of Birth: 04-21-1965  Session 1 Today's Date: 12/14/2021 OT Individual Time: 7482-7078 OT Individual Time Calculation (min): 59 min   Session 2 Today's Date: 12/14/2021 OT Individual Time: 1430-1510 OT Individual Time Calculation (min): 40 min   Short Term Goals: Week 1:  OT Short Term Goal 1 (Week 1): STG= LTG d/t ELOS  Skilled Therapeutic Interventions/Progress Updates:     Session 1  Pt received returning from the bathroom with the NT assisting, no c/o pain and agreeable to OT session. Pt recounting events of last night and OT providing emotional support and encouragement. Pt completed functional mobility to the sink with (S) and completed oral care in standing. Pt completed 100 ft of functional mobility to the therapy gym with (S) overall, cueing for forward gaze and increasing pace. She then completed dual processing task of stepping onto a 12 in step with no UE support and completing a novel animal naming task. Difficulty dual tasking with min cueing required to continue both tasks simultaneously. She was able to navigate the step with CGA. She reported a L calf cramp and required assist to stretch out her gastroc/soleus. Foam rolling also provided and pt reporting relief. She completed 2 more sets of this task with extended rest breaks between each set. She completed functional mobility back to the room with grading task up with increased pace. She was left sitting up with all needs met, chair alarm set.    Session 2 Pt received sitting in the recliner with no c/o pain, agreeable to OT session. She undressed in standing with (S). She completed functional mobility into the bathroom with (S) and transferred to the [facility] in the shower. She completed all bathing with (S) overall. While she was in the shower, discussed home planning and accessibility, including equipment. She returned to EOB with (S).  He completed UB and LB dressing with (S). She was left sitting EOB with her husband present, all needs within reach.    Therapy Documentation Precautions:  Precautions Precautions: Fall Precaution Comments: platelets <75k Restrictions Weight Bearing Restrictions: No   Therapy/Group: Individual Therapy  Curtis Sites 12/14/2021, 6:13 AM

## 2021-12-14 NOTE — Progress Notes (Signed)
Patient refused SCD and educated on purpose, side effect, complications. States she has had to much going on tonight and can not deal with placing on SCD's because they are uncomfortable .

## 2021-12-14 NOTE — Progress Notes (Addendum)
PROGRESS NOTE   Subjective/Complaints:  Overnight, patient endorses tachycardia into the 130s, along with anxiety and insomnia after getting an albuterol inhaler treatment.  She denies any shortness of breath, states that she only takes albuterol during seasonal changes.  Symptoms self resolved overnight, but she did refuse SCDs because she said "it was too much".  Patient notes pain and tenderness along her left inner arm, where there is a bruise.  She states this is from a blood pressure cuff being inflated over top of the line, which subsequently blew.  She denies any worsening of the swelling.  Finally, she notes that she has paperwork for her work leave.   She denies any blood in stool or urine.  CBC pending this AM.  ROS: Denies fevers, chills, N/V, abdominal pain, constipation, diarrhea, SOB, cough, chest pain, new weakness or paraesthesias.    Objective:   No results found. Recent Labs    12/12/21 0339 12/13/21 0520  WBC 1.8* 3.1*  HGB 8.0* 7.8*  HCT 24.5* 23.8*  PLT 62* 114*    Recent Labs    12/12/21 0339 12/13/21 0520  NA 137 140  K 4.0 3.9  CL 110 109  CO2 23 24  GLUCOSE 99 96  BUN 38* 30*  CREATININE 1.51* 1.37*  CALCIUM 8.1* 8.3*     Intake/Output Summary (Last 24 hours) at 12/14/2021 0924 Last data filed at 12/13/2021 1848 Gross per 24 hour  Intake 600 ml  Output --  Net 600 ml         Physical Exam: Vital Signs Blood pressure 117/80, pulse (!) 108, temperature 98.3 F (36.8 C), temperature source Oral, resp. rate 18, height 5\' 10"  (1.778 m), weight 73.2 kg, last menstrual period 05/22/2012, SpO2 100 %. Constitutional: No apparent distress. Appropriate appearance for age.  HENT: Atraumatic, normocephalic. Eyes: PERRLA. EOMI.  Cardiovascular: RRR, no murmurs/rub/gallops. No Edema.  Respiratory: CTAB. No rales, rhonchi, or wheezing. On RA.  Abdomen: + bowel sounds, normoactive. No  distention or tenderness.  Skin: Tender bruise along left inner arm without tension, warmth; appears resolving MSK:      No apparent deformity.      Strength: Antigravity and against resistance in all 4 extremities.                 Neurologic exam:  Cognition: AAO to person, place, time and event.  Language: Fluent, No substitutions or neoglisms. No dysarthria.  Memory: Mild deficits and delay, responsive to cueing.  Insight: Good insight into current condition.  Mood: Pleasant affect, appropriate mood. Mildly anxious CN: 2-12 grossly intact.  Coordination: No apparent tremors. No ataxia on FTN, HTS bilaterally.       Assessment/Plan: 1. Functional deficits which require 3+ hours per day of interdisciplinary therapy in a comprehensive inpatient rehab setting. Physiatrist is providing close team supervision and 24 hour management of active medical problems listed below. Physiatrist and rehab team continue to assess barriers to discharge/monitor patient progress toward functional and medical goals  Care Tool:  Bathing    Body parts bathed by patient: Right arm, Left arm, Right lower leg, Left lower leg, Chest, Face, Abdomen, Front perineal area, Buttocks, Right upper  leg, Left upper leg         Bathing assist Assist Level: Contact Guard/Touching assist     Upper Body Dressing/Undressing Upper body dressing   What is the patient wearing?: Pull over shirt, Bra    Upper body assist Assist Level: Supervision/Verbal cueing    Lower Body Dressing/Undressing Lower body dressing      What is the patient wearing?: Underwear/pull up, Pants     Lower body assist Assist for lower body dressing: Contact Guard/Touching assist     Toileting Toileting    Toileting assist Assist for toileting: Contact Guard/Touching assist     Transfers Chair/bed transfer  Transfers assist  Chair/bed transfer activity did not occur: Safety/medical concerns  Chair/bed transfer assist  level: Contact Guard/Touching assist     Locomotion Ambulation   Ambulation assist      Assist level: Minimal Assistance - Patient > 75% Assistive device: No Device Max distance: >150'   Walk 10 feet activity   Assist     Assist level: Contact Guard/Touching assist Assistive device: No Device   Walk 50 feet activity   Assist    Assist level: Minimal Assistance - Patient > 75% Assistive device: No Device    Walk 150 feet activity   Assist    Assist level: Minimal Assistance - Patient > 75% Assistive device: No Device    Walk 10 feet on uneven surface  activity   Assist     Assist level: Minimal Assistance - Patient > 75% Assistive device: Other (comment) (No AD)   Wheelchair     Assist Is the patient using a wheelchair?: No             Wheelchair 50 feet with 2 turns activity    Assist            Wheelchair 150 feet activity     Assist          Blood pressure 117/80, pulse (!) 108, temperature 98.3 F (36.8 C), temperature source Oral, resp. rate 18, height 5\' 10"  (1.778 m), weight 73.2 kg, last menstrual period 05/22/2012, SpO2 100 %.  Medical Problem List and Plan: 1. Functional deficits secondary to encephalopathy secondary to SIRS             -patient may  shower             -ELOS/Goals: 7-10d, Mod I ADL and mobility  2.  Antithrombotics: -DVT/anticoagulation:  Mechanical: Sequential compression devices, entire leg Bilateral lower extremities             -antiplatelet therapy: N/A 3. Pain Management: Local measures for now.  4. Mood/Behavior/Sleep: LCSW to follow for evaluation and support.              -antipsychotic agents: N/A            - Pt endorses poor sleep d/t racing thoughts, hospitalization; Trazadone 25-50 mg PRN  - not using, change to PRN melatonin 5 mg QHS 8/30            - DC albuterol 8/30 d/t anxiety and tachycardia            - Hydroxyzine 10 mg TID PRN for anxiety added 8/30              5.  Neuropsych/cognition: This patient is not fully capable of making decisions on her own behalf. 6. Skin/Wound Care: Routine pressure relief measures.             --  Lidocaine patch to L inner arm bruise 8/30 7. Fluids/Electrolytes/Nutrition: Continue to monitor renal status/LFTs closely.              --recheck CBC/CMET in am - CMET stable             - - Mg 1.3 on intake, ordered oral repletion, recheck 9/1 8. Sjogren's: Treated with Xolair every 14 days.  9. Abnormal LFTs: Much improved. AST 240, ALT 313, TB 1.4. INR- 1.2             --ammonia level 36-->53-->37--> 41 8/29, stable   -- Recheck LFTs --> downtrending 10 AKI: Resolving. BUN/SCR improved from 95/4.3-->38/1.51 --> 30/1.37 --Imporoving 11. Acute on chronic thrombocytopenia: Improving overall--followed by Dr. Clelia Croft.               --Intermittent hematuria documented 8/25, 8/26, 8/27              --Labs below pre-admit-->admit--Nadir-->current admit --Platelets (121)  41---> 11--->39--> 114; improving --WBC (4.0) 1.8--->1.5-->1.6 --> 3.1; improving --Hgb (11.6)  13.6-->8.2 --> 7.8; recheck CBC and FOBT in 8/30 pending --Pancytopenia improving -- Request maintenance pneumonia vaccination be held until OP f/u, in setting of pancytopenia   12. HTN: Monitor BP TID. Continue to hold HCTZ.             --on metoprolol BID-->question affecting respiratory status.  13. H/o Asthma: On singulair. Resume MDI as SOB/increased WOB reported with minimal activity.               -- DC albuterol 8/30 due to tachycardia, anxiety   14. Constipation: Will start Miralax every other day.                - LBM 8/29     LOS: 2 days A FACE TO FACE EVALUATION WAS PERFORMED  Angelina Sheriff 12/14/2021, 9:24 AM

## 2021-12-14 NOTE — Progress Notes (Signed)
Occupational Therapy Discharge Summary  Patient Details  Name: Megan Reid MRN: [id number] Date of Birth: [birth year]  Date of Discharge from [place] service:December 17, 2021   Patient has met 8 of 8 long term goals due to improved activity tolerance, improved balance, postural control, ability to compensate for deficits, improved attention, improved awareness, and improved coordination.  Patient to discharge at overall Modified Independent level.  Patient's care partner is independent to provide the necessary cognitive assistance at discharge. Megan Reid has made excellent progress in CIR, progressing to mod I level overall. Her husband is supportive and present and she will also have the support of her sister. She will benefit from continued OT services to address work hardening and cognitive retraining for return to work.   Recommendation:  Patient will benefit from ongoing skilled OT services in outpatient setting to continue to advance functional skills in the area of BADL.  Equipment: TTB  Reasons for discharge: treatment goals met and discharge from hospital  Patient/family agrees with progress made and goals achieved: Yes  OT Discharge Precautions/Restrictions  Precautions Precautions: Fall Precaution Comments: platelets <75k Restrictions Weight Bearing Restrictions: No  Pain Pain Assessment Pain Score: 0-No pain ADL ADL Eating: Independent Where Assessed-Eating: Chair Grooming: Modified independent Where Assessed-Grooming: Standing at sink Upper Body Bathing: Modified independent Where Assessed-Upper Body Bathing: Shower Lower Body Bathing: Modified independent Where Assessed-Lower Body Bathing: Shower Upper Body Dressing: Modified independent (Device) Where Assessed-Upper Body Dressing: Edge of bed Lower Body Dressing: Modified independent Where Assessed-Lower Body Dressing: Edge of bed Toileting: Modified independent Where Assessed-Toileting: Contractor: Diplomatic Services operational officer Method: Human resources officer: Modified independent Clinical cytogeneticist Method: Optometrist: Civil engineer, contracting with back Social research officer, government: Modified independent Social research officer, government Method: Heritage manager: Civil engineer, contracting with back Vision Baseline Vision/History: 1 Wears glasses Patient Visual Report: No change from baseline Vision Assessment?: No apparent visual deficits Perception  Perception: Within Functional Limits Praxis Praxis: Intact Cognition Brief Interview for Mental Status (BIMS) Repetition of Three Words (First Attempt): 3 Temporal Orientation: Year: Correct Temporal Orientation: Month: Accurate within 5 days Temporal Orientation: Day: Correct Recall: "Sock": Yes, no cue required Recall: "Blue": Yes, no cue required Recall: "Bed": Yes, no cue required BIMS Summary Score: 15 Sensation Sensation Light Touch: Appears Intact Hot/Cold: Appears Intact Coordination Gross Motor Movements are Fluid and Coordinated: Yes Fine Motor Movements are Fluid and Coordinated: Yes Finger Nose Finger Test: [facility] Motor  Motor Motor: Within Functional Limits Mobility  Bed Mobility Bed Mobility: Rolling Right;Rolling Left;Supine to Sit;Sit to Supine Rolling Right: Independent Rolling Left: Independent Supine to Sit: Independent Sit to Supine: Independent Transfers Sit to Stand: Independent Stand to Sit: Independent  Trunk/Postural Assessment  Cervical Assessment Cervical Assessment: Within Functional Limits Thoracic Assessment Thoracic Assessment: Within Functional Limits Lumbar Assessment Lumbar Assessment: Within Functional Limits Postural Control Postural Control: Within Functional Limits  Balance Balance Balance Assessed: Yes Static Sitting Balance Static Sitting - Balance Support: Feet supported Static Sitting - Level of Assistance: 7: Independent Dynamic Sitting  Balance Dynamic Sitting - Balance Support: During functional activity Dynamic Sitting - Level of Assistance: 7: Independent Static Standing Balance Static Standing - Balance Support: During functional activity Static Standing - Level of Assistance: 7: Independent Dynamic Standing Balance Dynamic Standing - Balance Support: During functional activity Dynamic Standing - Level of Assistance: 6: Modified independent (Device/Increase time) Extremity/Trunk Assessment RUE Assessment RUE Assessment: Within Functional Limits LUE Assessment LUE Assessment: Within Functional Limits   Megan Reid  Davis 12/14/2021, 3:26 PM

## 2021-12-14 NOTE — Patient Care Conference (Signed)
Inpatient RehabilitationTeam Conference and Plan of Care Update Date: 12/14/2021   Time: 11:59 AM    Patient Name: Megan Reid      Medical Record Number: [id number]  Date of Birth: [birth year] Sex: Female         Room/Bed: 4M04C/4M04C-01 Payor Info: Payor: [employer] / Plan: [facility] PPO / Product Type: *No Product type* /    Admit Date/Time:  12/12/2021  1:59 PM  Primary Diagnosis:  Toxic metabolic encephalopathy  Hospital Problems: Principal Problem:   Toxic metabolic encephalopathy    Expected Discharge Date: Expected Discharge Date: 12/18/21  Team Members Present: Physician leading conference: Dr. Sula Soda Social Worker Present: Dossie Der, LCSW Nurse Present: Chana Bode, RN PT Present: Other (comment) Sherron Ales PT) OT Present: Jake Shark, OT SLP Present: Feliberto Gottron, SLP PPS Coordinator present : Fae Pippin, SLP     Current Status/Progress Goal Weekly Team Focus  Bowel/Bladder   Patient is continent of bladder and bowel. LBM 12/13/21  Maintain normalcy  Assess QS/PRN, administer prn as needed   Swallow/Nutrition/ Hydration             ADL's   CGA with ADL transfers and all ADLs.  mod I  Dynamic balance, endurance, ADLs, transfers   Mobility   CGA/MinA with gait distances >50 feet and stairs up to 12 steps with S HR  ModI  Endurance/Activity tolerance; dynamic stability; B LE strength   Communication             Safety/Cognition/ Behavioral Observations  Min A - Supervision A  Supervision A  functional memory, problem solving, executive function   Pain   Denies pain  < 2/10 on pain scale  QS/PRN assessment of pain   Skin   Skin intact, Ecchymosis areas to abdomen, arms,  Assess Skin QS/PRN, address new skin issues with medical team and peers  Assess skin QS/PRN     Discharge Planning:  Home with husband and daughter between both can provide 24/7 supervision.   Team Discussion: Patient working on dynamic stability and  mild cognition issues; executive functioning and cognition tasks, short tem recall.  Patient on target to meet rehab goals: yes, able to ambulate over 7 minutes without an assistive device with CGA. Goals for discharge set for mod I overall.  *See Care Plan and progress notes for long and short-term goals.   Revisions to Treatment Plan:  N/a  Teaching Needs: Safety,medications,secondary risk management, transfers,etc.  Current Barriers to Discharge: Decreased caregiver support  Possible Resolutions to Barriers: Family education OP follow up services DME: TTB     Medical Summary Current Status: tachycardia after receiving albuterol inhaler, constipation, left arm pain, anemia  Barriers to Discharge: Medical stability  Barriers to Discharge Comments: tachycardia after receiving albuterol inhaler, constipation, left arm pain, anemia Possible Resolutions to [employer] Focus: will discuss with MD reducing albuterol to prn, continue miralax, continue to monitor hemoglobin, stool guiac today   Continued Need for Acute Rehabilitation Level of Care: The patient requires daily medical management by a physician with specialized training in physical medicine and rehabilitation for the following reasons: Direction of a multidisciplinary physical rehabilitation program to maximize functional independence : Yes Medical management of patient stability for increased activity during participation in an intensive rehabilitation regime.: Yes Analysis of laboratory values and/or radiology reports with any subsequent need for medication adjustment and/or medical intervention. : Yes   I attest that I was present, lead the team conference, and concur  with the assessment and plan of the team.   Pamelia Hoit 12/14/2021, 3:58 PM

## 2021-12-14 NOTE — Progress Notes (Signed)
Physical Therapy Session Note  Patient Details  Name: Megan Reid MRN: [id number] Date of Birth: May 10, 1965  Today's Date: 12/14/2021 PT Individual Time: 1015-1115 PT Individual Time Calculation (min): 60 min   Short Term Goals: Week 1:  PT Short Term Goal 1 (Week 1): STG=LTG due to ELOS  Skilled Therapeutic Interventions/Progress Updates:  Patient greeted sitting in bedside recliner and agreeable to PT treatment session.   Patient gait trained from room to day room rehab gym (>150') without the use of an AD and CGA for safety- Patient with one LOB when approaching a crowded hallway, however able to appropriately use her stepping strategy to regain balance.   Berg Balance Assessment completed during treatment session, please see below.   Patient gait trained >300' without an AD and CGA for safety- Gait trial focused on turning/dynamic stability. Therapist calling out turning right vs left and 180 degree turn with VC for increased cadence.   During a seated rest break patient was able to spend time with a therapy dog, Dixie, for overall improved affect/mood as patient reported she is missing her cat at home- Patient reported improved affect after spending time with therapy dog.   Patient gait trained from the day room gym back to her room with emphasis on increased cadence- Gait trial completed without the use of an AD and CGA for safety.   Toileted in room with supervision and no AD. Extended rest break with patient discussing plans for reintegration to work with discussion of starting back at the beginning of Novemeber with gradual re-entry.   Patient gait trained ~6 minutes without the use of an AD and CGA for safety- VC for increased cadence and cognitive dual tasking. Patient with improving endurance/activity tolerance and overall stability.   Patient ascended/descended x12 steps with L HR and CGA for safety- Patient demonstrated a reciprocal pattern with improved eccentric control  compared to yesterday.    Patient returned to her room sitting in bedside recliner with posey belt on, call bell within reach, tray table in front and all needs met.   Therapy Documentation Precautions:  Precautions Precautions: Fall Precaution Comments: platelets <75k Restrictions Weight Bearing Restrictions: No Balance: Balance Balance Assessed: Yes Standardized Balance Assessment Standardized Balance Assessment: Berg Balance Test Berg Balance Test Sit to Stand: Able to stand using hands after several tries Standing Unsupported: Able to stand 2 minutes with supervision Sitting with Back Unsupported but Feet Supported on Floor or Stool: Able to sit safely and securely 2 minutes Stand to Sit: Controls descent by using hands Transfers: Able to transfer with verbal cueing and /or supervision Standing Unsupported with Eyes Closed: Able to stand 10 seconds with supervision Standing Ubsupported with Feet Together: Able to place feet together independently but unable to hold for 30 seconds From Standing, Reach Forward with Outstretched Arm: Can reach forward >5 cm safely (2") From Standing Position, Pick up Object from Floor: Able to pick up shoe, needs supervision From Standing Position, Turn to Look Behind Over each Shoulder: Looks behind one side only/other side shows less weight shift Turn 360 Degrees: Able to turn 360 degrees safely but slowly Standing Unsupported, Alternately Place Feet on Step/Stool: Able to complete >2 steps/needs minimal assist Standing Unsupported, One Foot in Front: Needs help to step but can hold 15 seconds Standing on One Leg: Tries to lift leg/unable to hold 3 seconds but remains standing independently Total Score: 32  Patient demonstrates increased fall risk as noted by score of   32/56 on  Berg Balance Scale.  (<36= high risk for falls, close to 100%; 37-45 significant >80%; 46-51 moderate >50%; 52-55 lower >25%)  Therapy/Group: Individual  Therapy  Victoriana Aziz 12/14/2021, 1:12 PM

## 2021-12-14 NOTE — IPOC Note (Signed)
Overall Plan of Care [facility]) Patient Details Name: Megan Reid MRN: [id number] DOB: 1965-07-13  Admitting Diagnosis: Toxic metabolic encephalopathy  Hospital Problems: Principal Problem:   Toxic metabolic encephalopathy     Functional Problem List: Nursing Endurance, Safety, Medication Management, Bowel  PT Balance, Safety, Endurance, Motor, Sensory  OT Balance, Endurance, Motor, Cognition, Sensory  SLP Cognition  TR         Basic ADL's: OT Bathing, Dressing, Toileting     Advanced  ADL's: OT       Transfers: PT Bed Mobility, Bed to Chair, Customer service manager, Tub/Shower     Locomotion: PT Ambulation, Stairs     Additional Impairments: OT None  SLP Social Cognition   Problem Solving, Memory  TR      Anticipated Outcomes Item Anticipated Outcome  Self Feeding no goal  Swallowing      Basic self-care  mod I  Toileting  mod I   Bathroom Transfers mod I  Bowel/Bladder  manage w mod I  Transfers  ModI with transfers with LRAD  Locomotion  ModI with LRAD- Gait >150'  Communication     Cognition  Supervision A  Pain  n/a  Safety/Judgment  manage w cues   Therapy Plan: PT Intensity: Minimum of 1-2 x/day ,45 to 90 minutes PT Frequency: 5 out of 7 days PT Duration Estimated Length of Stay: 5-7 days OT Intensity: Minimum of 1-2 x/day, 45 to 90 minutes OT Frequency: 5 out of 7 days OT Duration/Estimated Length of Stay: 5-7 days SLP Intensity: Minumum of 1-2 x/day, 30 to 90 minutes SLP Frequency: 3 to 5 out of 7 days SLP Duration/Estimated Length of Stay: 5-7 days   Team Interventions: Nursing Interventions Bowel Management, Patient/Family Education, Medication Management, Disease Management/Prevention, Discharge Planning  PT interventions Ambulation/gait training, Cognitive remediation/compensation, Discharge planning, DME/adaptive equipment instruction, Functional mobility training, Psychosocial support, Therapeutic Activities, UE/LE Strength  taining/ROM, Visual/perceptual remediation/compensation, Warden/ranger, Community reintegration, Disease management/prevention, Functional electrical stimulation, Neuromuscular re-education, Patient/family education, Skin care/wound management, Therapeutic Exercise, Stair training, UE/LE Coordination activities  OT Interventions Balance/vestibular training, Discharge planning, Pain management, Self Care/advanced ADL retraining, Therapeutic Activities, UE/LE Coordination activities, Cognitive remediation/compensation, Disease mangement/prevention, Functional mobility training, Patient/family education, Therapeutic Exercise, DME/adaptive equipment instruction, Psychosocial support, UE/LE Strength taining/ROM, Community reintegration  SLP Interventions Cognitive remediation/compensation, Internal/external aids, Cueing hierarchy, Functional tasks  TR Interventions    SW/CM Interventions Discharge Planning, Psychosocial Support, Patient/Family Education   Barriers to Discharge MD  Medical stability, Home enviroment access/loayout, Insurance for SNF coverage, Medication compliance, and respiratory   deconditioning.   Nursing Decreased caregiver support, Home environment access/layout 1 level 5ste right rail w spouse  PT Home environment Best boy, Community education officer for SNF coverage    OT      SLP      SW Insurance for SNF coverage     Team Discharge Planning: Destination: PT-Home ,OT- Home , SLP-Home Projected Follow-up: PT-Outpatient PT, OT-  Outpatient OT, SLP-24 hour supervision/assistance Projected Equipment Needs: PT- , OT- To be determined, SLP-None recommended by SLP Equipment Details: PT-TBD, OT-  Patient/family involved in discharge planning: PT- Patient, Family member/caregiver,  OT-Patient, Family member/caregiver, SLP-Patient  MD ELOS: 7-10 days Medical Rehab Prognosis:  Excellent Assessment: The patient has been admitted for CIR therapies with the diagnosis of toxic  metabolic encephalopathy secondary to SIRS. The team will be addressing functional mobility, strength, stamina, balance, safety, adaptive techniques and equipment, self-care, patient and caregiver education, . Goals have been set at Mod I  to independent. Anticipated discharge destination is home.       See Team Conference Notes for weekly updates to the plan of care     Angelina Sheriff, DO 12/14/2021

## 2021-12-15 NOTE — Progress Notes (Signed)
PROGRESS NOTE   Subjective/Complaints:  Patient doing well this AM, planning to go outside with therapies. Daughter at bedside for support. No complaints, concerns. Sleeping well, eating well.   ROS: Denies fevers, chills, N/V, abdominal pain, constipation, diarrhea, SOB, cough, chest pain, new weakness or paraesthesias.    Objective:   No results found. Recent Labs    12/13/21 0520 12/14/21 0938  WBC 3.1* 3.1*  HGB 7.8* 7.6*  HCT 23.8* 23.2*  PLT 114* 182    Recent Labs    12/13/21 0520  NA 140  K 3.9  CL 109  CO2 24  GLUCOSE 96  BUN 30*  CREATININE 1.37*  CALCIUM 8.3*     Intake/Output Summary (Last 24 hours) at 12/15/2021 1242 Last data filed at 12/15/2021 3300 Gross per 24 hour  Intake 1052 ml  Output --  Net 1052 ml         Physical Exam: Vital Signs Blood pressure 115/79, pulse 88, temperature 98.1 F (36.7 C), resp. rate 17, height 5\' 10"  (1.778 m), weight 73.2 kg, last menstrual period 05/22/2012, SpO2 100 %. Constitutional: No apparent distress. Appropriate appearance for age.  HENT: Atraumatic, normocephalic. Eyes: PERRLA. EOMI.  Cardiovascular: RRR, no murmurs/rub/gallops. No Edema.  Respiratory: CTAB. No rales, rhonchi, or wheezing. On RA.  Abdomen: + bowel sounds, normoactive. No distention or tenderness.  Skin: bruise along left inner arm stable MSK:      No apparent deformity.      Strength: Antigravity and against resistance in all 4 extremities.                 Neurologic exam:  Cognition: AAO to person, place, time and event.  Language: Fluent, No substitutions or neoglisms. No dysarthria.  Memory: Improving recall and processing speed.  Insight: Good insight into current condition.  Mood: Pleasant affect, appropriate mood. CN: 2-12 grossly intact.  Coordination: No apparent tremors.      Assessment/Plan: 1. Functional deficits which require 3+ hours per day of  interdisciplinary therapy in a comprehensive inpatient rehab setting. Physiatrist is providing close team supervision and 24 hour management of active medical problems listed below. Physiatrist and rehab team continue to assess barriers to discharge/monitor patient progress toward functional and medical goals  Care Tool:  Bathing    Body parts bathed by patient: Right arm, Left arm, Right lower leg, Left lower leg, Chest, Face, Abdomen, Front perineal area, Buttocks, Right upper leg, Left upper leg         Bathing assist Assist Level: Independent     Upper Body Dressing/Undressing Upper body dressing   What is the patient wearing?: Pull over shirt, Bra    Upper body assist Assist Level: Independent    Lower Body Dressing/Undressing Lower body dressing      What is the patient wearing?: Underwear/pull up, Pants     Lower body assist Assist for lower body dressing: Independent     Toileting Toileting    Toileting assist Assist for toileting: Independent     Transfers Chair/bed transfer  Transfers assist  Chair/bed transfer activity did not occur: Safety/medical concerns  Chair/bed transfer assist level: Independent     Locomotion Ambulation  Ambulation assist      Assist level: Minimal Assistance - Patient > 75% Assistive device: No Device Max distance: >150'   Walk 10 feet activity   Assist     Assist level: Contact Guard/Touching assist Assistive device: No Device   Walk 50 feet activity   Assist    Assist level: Minimal Assistance - Patient > 75% Assistive device: No Device    Walk 150 feet activity   Assist    Assist level: Minimal Assistance - Patient > 75% Assistive device: No Device    Walk 10 feet on uneven surface  activity   Assist     Assist level: Minimal Assistance - Patient > 75% Assistive device: Other (comment) (No AD)   Wheelchair     Assist Is the patient using a wheelchair?: No              Wheelchair 50 feet with 2 turns activity    Assist            Wheelchair 150 feet activity     Assist          Blood pressure 115/79, pulse 88, temperature 98.1 F (36.7 C), resp. rate 17, height 5\' 10"  (1.778 m), weight 73.2 kg, last menstrual period 05/22/2012, SpO2 100 %.  Medical Problem List and Plan: 1. Functional deficits secondary to encephalopathy secondary to SIRS             -patient may  shower             -ELOS/Goals: 7-10d, Mod I ADL and mobility  2.  Antithrombotics: -DVT/anticoagulation:  Mechanical: Sequential compression devices, entire leg Bilateral lower extremities             -antiplatelet therapy: N/A 3. Pain Management: Local measures for now.  4. Mood/Behavior/Sleep: LCSW to follow for evaluation and support.              -antipsychotic agents: N/A            - Pt endorses poor sleep d/t racing thoughts, hospitalization; Trazadone 25-50 mg PRN  - not using, change to PRN melatonin 5 mg QHS 8/30            - DC albuterol 8/30 d/t anxiety and tachycardia            - Hydroxyzine 10 mg TID PRN for anxiety added 8/30              5. Neuropsych/cognition: This patient is not fully capable of making decisions on her own behalf. 6. Skin/Wound Care: Routine pressure relief measures.             -- Lidocaine patch to L inner arm bruise 8/30 7. Fluids/Electrolytes/Nutrition: Continue to monitor renal status/LFTs closely.              --recheck CBC/CMET in am - CMET stable             - - Mg 1.3 on intake, ordered oral repletion, recheck 9/1 8. Sjogren's: Treated with Xolair every 14 days.  9. Abnormal LFTs: Much improved. AST 240, ALT 313, TB 1.4. INR- 1.2             --ammonia level 36-->53-->37--> 41 8/29, stable   -- Recheck LFTs --> downtrending 10 AKI: Resolving. BUN/SCR improved from 95/4.3-->38/1.51 --> 30/1.37 --Imporoving 11. Acute on chronic thrombocytopenia: Improving overall--followed by Dr. Clelia Croft.               --Intermittent  hematuria documented 8/25, 8/26, 8/27              --Labs below pre-admit-->admit--Nadir-->current admit --Platelets (121)  41---> 11--->39--> 114; improving --WBC (4.0) 1.8--->1.5-->1.6 --> 3.1; improving --Hgb (11.6)  13.6-->8.2 --> 7.8 --> 7.6, stable 8/30 --Pancytopenia improving -- Request maintenance pneumonia vaccination be held until OP f/u, in setting of pancytopenia   12. HTN: Monitor BP TID. Continue to hold HCTZ.             --on metoprolol BID-->question affecting respiratory status.  13. H/o Asthma: On singulair. Resume MDI as SOB/increased WOB reported with minimal activity.               -- DC albuterol 8/30 due to tachycardia, anxiety   14. Constipation: Will start Miralax every other day. Resolved.                    LOS: 3 days A FACE TO FACE EVALUATION WAS PERFORMED  Angelina Sheriff 12/15/2021, 12:42 PM

## 2021-12-15 NOTE — Progress Notes (Signed)
Physical Therapy Session Note  Patient Details  Name: Megan Reid MRN: [id number] Date of Birth: [birth year]  Today's Date: 12/15/2021 PT Individual Time: 1045-1200 PT Individual Time Calculation (min): 75 min   Short Term Goals: Week 1:  PT Short Term Goal 1 (Week 1): STG=LTG due to ELOS  Skilled Therapeutic Interventions/Progress Updates:  Patient greeted sitting EOB with daughter present and agreeable to PT treatment session. Patient with tennis shoes donned this treatment session and verbal cues required for increased B foot clearance- Patient required increased time to get used to ambulating with shoes on.   Patient gait trained from room throughout the 4th floor, to the first floor and outside on various surfaces without the use of an AD and SBA/CGA for safety. Patient educated on scanning her environment in order to notice changes in terrain. While outside patient took a seated rest break on a bench and discussed discharge planning consisting of continued therapy, etc.   Patient then ambulated throughout the gift shop with gait focusing on turning in confined spaces- Patient was able to complete without LOB noted.   Patient then gait trained back to the 4th floor and was able to manage the elevator along the way.   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 Patient required 3 seated rest breaks throughout high-level gait tasks.   Therapist educated patient on the various balance strategies- ankle vs hip vs stepping in  order to improve her understanding.   Patient gait trained back to her room without the use of an assistive device and SBA for safety- Once in her room, patient toileted with superv.    Patient left sitting in bedside recliner with call bell within reach and all needs met.     Therapy Documentation Precautions:  Precautions Precautions: Fall Precaution Comments: platelets <75k Restrictions Weight Bearing Restrictions: No   Therapy/Group: Individual Therapy  Carver Murakami 12/15/2021, 7:41 AM

## 2021-12-15 NOTE — Progress Notes (Signed)
Speech Language Pathology Daily Session Note  Patient Details  Name: Megan Reid MRN: [id number] Date of Birth: [birth year]  Today's Date: 12/15/2021 SLP Individual Time: 0815-0915 SLP Individual Time Calculation (min): 60 min  Short Term Goals: Week 1: SLP Short Term Goal 1 (Week 1): STG = LTG d/t ELOS  Skilled Therapeutic Interventions: Skilled ST treatment focused on cognitive goals. SLP facilitated session by providing overall sup A fading to mod I for working memory, sustained, and alternating attention tasks using the BITS to achieve 80-90% accuracy. Facilitated trail making task in which pt completed with 100% accuracy with mod I and self corrected errors x2. SLP then facilitated complex scheduling task with sup A verbal cues for organization and prioritization of information for various time constraints to achieve 80% accuracy. Patient was left in recliner with alarm activated and immediate needs within reach at end of session. Daughter at bedside. Continue per current plan of care.      Pain Pain Assessment Pain Scale: 0-10 Pain Score: 0-No pain  Therapy/Group: Individual Therapy  Tamala Ser 12/15/2021, 9:19 AM

## 2021-12-15 NOTE — Progress Notes (Signed)
Occupational Therapy Session Note  Patient Details  Name: Megan Reid MRN: [id number] Date of Birth: 28-Feb-1966  Today's Date: 12/15/2021 OT Individual Time: 0933-1005 & 1504 - 1542 OT Individual Time Calculation (min): 32 min & 38 min   Short Term Goals: Week 1:  OT Short Term Goal 1 (Week 1): STG= LTG d/t ELOS  First Session (2330 - 1005) - Skilled Therapeutic Interventions/Progress Updates:  Pt awake in recliner with daughter present in room upon OT arrival to the room. Pt reports, "I'm trying to find paperwork for my job." Pt in agreement for OT session.  Therapy Documentation Precautions:  Precautions Precautions: Fall Precaution Comments: platelets <75k Restrictions Weight Bearing Restrictions: No Vital Signs: Please see "Flowsheet" for most recent vitals charted by nursing staff.  Pain: Pain Assessment Pain Scale: 0-10 Pain Score: 0-No pain  ADL: Pt declines need to perform ADL's at this time, however, in agreement to perform a shower during PM OT session.   Functional Mobility & Environmental Navigation: Pt participates in therapeutic activity in order to improve functional mobility endurance and environmental navigation skills within a familiar environment which is needed for pre-community mobility and return to work. Pt able to perform sit <> stand from recliner with modified independence using BUE to push up from arm rests. Pt able to perform functional ambulation in the hallway from room <> day room (>150' x 2) with close supervision and minimal VC's for proper pacing. Pt noted to look at feet during majority of ambulation with VC's provided to improve ability to participate in visual scanning of the environment during ambulation. Pt demo's 1 small LOB when attempting to turn to look to the side and terminate walking at the same time, however, pt able to self-correct with only close supervision. Pt able to perform environmental navigation in a familiar environment  independence and no VC's required. Pt does demo SOB and fatigue after ambulatory task. Pt reports that some of the SOB is contributed to pt thinking about job demand and current level of functioning. OT provided education on importance of standing rest breaks with ambulation, PLB techniques, and using upbeat music as a distraction and to assist with pacing. Pt verbalizes understanding.   Pt requested to stay in the recliner at end of session. Pt left sitting comfortably in the recliner with personal belongings and call light within reach, belt alarm placed and activated, with daughter present in room, and comfort needs attended to.   -------------------------------------------------------------------------------------------------------------------------------------------------------------------------------------------------------------------------  Second Session (1504 - 1542) - Skilled Therapeutic Interventions/Progress Updates:  Pt awake laying in bed upon OT arrival to the room. Pt reports, "My husband works 12 hour shifts, but he usually comes up before work." Pt in agreement for OT session.  Therapy Documentation Pain:  12/15/21 1504  Pain Assessment  Pain Scale 0-10  Pain Score 0     ADL's:  12/15/21 1515  ADL  Grooming Modified independent (Pt able to perform oral care standing at the sink with modified independence with use of sink counter for stability.)  Where Assessed-Grooming Standing at sink  Upper Body Bathing Modified independent (Pt able to bathe all UB parts with modified independence while seated on BSC placed in the shower.)  Where Assessed-Upper Body Bathing Shower  Lower Body Bathing Modified independent (Pt able to bathe all LB parts while seated on BSC and standing with use of grab bars to bathe peri-areas and buttocks with modified independence.)  Where Assessed-Lower Body Bathing Shower  Upper Body Dressing Modified independent (Device) (  Pt able to doff and don  a pull-over style shirt and jacket with modified independence while seated at EOB. Pt able to perform clothing retrieval using UE support on cabinet while retrieving clothing from bottoms drawer.)  Where Assessed-Upper Body Dressing Edge of bed  Lower Body Dressing Modified independent (Pt able to doff and don pants and underwear with modified independence while seated at EOB and standing to pull clothing up/down. Pt able to perform clothing retrieval using UE support on cabinet while retrieving clothing from bottoms drawer.)  Where Assessed-Lower Body Dressing Edge of bed  Toileting  (Pt declines need at this time.)  Film/video editor Modified independent (Pt able to perform ambulatory transfer from EOB <> BSC placed in the shower to use as shower chair with modified independence and use of grab bars when entering shower. No LOB noted.)  Chartered certified accountant with back;Grab bars  ADL Comments Pt able to complete various ambulatory transfers from EOB > cabinet to retrieve clothing > EOB > BSC placed in shower > > cabinet > sink > EOB with modified independence. Pt able to retrieve clothing from bottom drawer of cabinet with use of UE support on cabinet. Pt requires increased time to complete dressing as pt involved in problem solving discussion regarding DC and pt demo's minimal difficulty with dual tasking to participate in conversation while undressing, however, pt able to continue with tasks with modified independence. Pt reports no concerns for DC Sunday at this time.    Pt returned to bed at end of session. Pt left resting comfortably in bed with personal belongings and call light within reach, bed alarm on and activated, bed in low position, 3 bed rails up, and comfort needs attended to.   Therapy/Group: Individual Therapy  Lavera Guise 12/15/2021, 3:42 PM

## 2021-12-16 DIAGNOSIS — I10 Essential (primary) hypertension: Secondary | ICD-10-CM

## 2021-12-16 DIAGNOSIS — K5900 Constipation, unspecified: Secondary | ICD-10-CM

## 2021-12-16 LAB — MAGNESIUM: Magnesium: 1.2 mg/dL — ABNORMAL LOW (ref 1.7–2.4)

## 2021-12-16 MED ORDER — MELATONIN 5 MG PO TABS: 5.0000 mg | ORAL_TABLET | Freq: Every evening | ORAL | 0 refills | Status: AC | PRN

## 2021-12-16 MED ORDER — FLUTICASONE PROPIONATE 50 MCG/ACT NA SUSP
2.0000 | Freq: Every day | NASAL | 2 refills | Status: DC
Start: 2021-12-17 — End: 2022-03-08

## 2021-12-16 MED ORDER — ACETAMINOPHEN 325 MG PO TABS: 325.0000 mg | ORAL_TABLET | ORAL | Status: DC | PRN

## 2021-12-16 MED ORDER — METOPROLOL TARTRATE 25 MG PO TABS: 25.0000 mg | ORAL_TABLET | Freq: Two times a day (BID) | ORAL | 0 refills | Status: DC

## 2021-12-16 MED ORDER — MAGNESIUM OXIDE -MG SUPPLEMENT 400 (240 MG) MG PO TABS: 800.0000 mg | ORAL_TABLET | Freq: Two times a day (BID) | ORAL | 0 refills | Status: DC

## 2021-12-16 MED ORDER — ADULT MULTIVITAMIN W/MINERALS CH: 1.0000 | ORAL_TABLET | Freq: Every day | ORAL | Status: AC

## 2021-12-16 MED ORDER — POLYETHYLENE GLYCOL 3350 17 G PO PACK: 17.0000 g | PACK | ORAL | 0 refills | Status: DC

## 2021-12-16 MED ORDER — HYDROXYZINE HCL 10 MG PO TABS: 10.0000 mg | ORAL_TABLET | Freq: Three times a day (TID) | ORAL | 0 refills | Status: AC | PRN

## 2021-12-16 MED ORDER — MAGNESIUM OXIDE -MG SUPPLEMENT 400 (240 MG) MG PO TABS
800.0000 mg | ORAL_TABLET | Freq: Two times a day (BID) | ORAL | Status: DC
  Administered 2021-12-16 – 2021-12-18 (×5): 800 mg via ORAL
  Filled 2021-12-16 (×5): qty 2

## 2021-12-16 MED ORDER — MAGNESIUM SULFATE 4 GM/100ML IV SOLN
4.0000 g | Freq: Once | INTRAVENOUS | Status: AC
Start: 2021-12-16 — End: 2021-12-16
  Administered 2021-12-16: 4 g via INTRAVENOUS
  Filled 2021-12-16: qty 100

## 2021-12-16 NOTE — Progress Notes (Signed)
Inpatient Rehabilitation Care Coordinator Discharge Note DC SUN 9/3  Patient Details  Name: Megan Reid MRN: [id number] Date of Birth: Jul 10, 1965   Discharge location: HOME WITH HUSBAND AND DAUGHTER TO ASSIST-PROVIDE SUPERVISION LEVEL  Length of Stay: 6 DAYS  Discharge activity level: INDEPENDENT LEVEL  Home/community participation: ACTIVE  Patient response FO:YDXAJO Literacy - How often do you need to have someone help you when you read instructions, pamphlets, or other written material from your doctor or pharmacy?: Never  Patient response IN:OMVEHM Isolation - How often do you feel lonely or isolated from those around you?: Never  Services provided included: MD, RD, PT, OT, SLP, RN, CM, TR, Pharmacy, SW  Financial Services:  Field seismologist Utilized: Private Insurance [employer]  Choices offered to/list presented to: PT AND HUSBAND  Follow-up services arranged:  Outpatient    Outpatient Servicies: [facility]-PT OT SP WILL CALL TO SET UP FOLLOW UP APPOINTMENTS NO EQUIPMENT NEEDS      Patient response to transportation need: Is the patient able to respond to transportation needs?: Yes In the past 12 months, has lack of transportation kept you from medical appointments or from getting medications?: No In the past 12 months, has lack of transportation kept you from meetings, work, or from getting things needed for daily living?: No    Comments (or additional information): PT DID WELL WHILE HERE AND REACHED HER 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   Patient/Family verbalized understanding of follow-up arrangements:  Yes  Individual responsible for coordination of the follow-up plan: SELF AND Megan Reid [phone]  Confirmed correct DME delivered: Lucy Chris 12/16/2021    Lucy Chris

## 2021-12-16 NOTE — Discharge Summary (Signed)
Physical Therapy Discharge Summary  Patient Details  Name: Megan Reid MRN: [id number] Date of Birth: Oct 14, 1965  Date of Discharge from PT service:December 18, 2021  {CHL IP REHAB PT TIME CALCULATION:304800500}   Patient has met 7 of 7 long term goals due to improved activity tolerance, improved balance, improved postural control, and increased strength.  Patient to discharge at an ambulatory level Independent.   Patient's care partner is independent to provide the necessary  supervision  assistance at discharge.  Reasons goals not met: NA  Recommendation:  Patient will benefit from ongoing skilled PT services in outpatient setting to continue to advance safe functional mobility, address ongoing impairments in dynamic stability, decreased strength, impaired endurance/activity tolerance and minimize fall risk.  Equipment: No equipment provided  Reasons for discharge: treatment goals met and discharge from hospital  Patient/family agrees with progress made and goals achieved: Yes  PT Discharge Precautions/Restrictions Precautions Precautions: Fall Restrictions Weight Bearing Restrictions: No Pain Interference   Vision/Perception  Vision - History Ability to See in Adequate Light: 0 Adequate Perception Perception: Within Functional Limits Praxis Praxis: Intact  Cognition Overall Cognitive Status: Within Functional Limits for tasks assessed Arousal/Alertness: Awake/alert Orientation Level: Oriented X4 Attention: Alternating Selective Attention: Appears intact Alternating Attention: Appears intact Awareness: Appears intact Problem Solving: Appears intact Safety/Judgment: Appears intact Comments: Decreased processing and recall, however significantly improved since initial evaluation. Sensation Sensation Light Touch: Appears Intact Hot/Cold: Appears Intact Coordination Gross Motor Movements are Fluid and Coordinated: Yes Fine Motor Movements are Fluid and  Coordinated: Yes Motor  Motor Motor - Skilled Clinical Observations: Global weakness, however improving  Mobility Bed Mobility Bed Mobility: Rolling Right;Rolling Left;Supine to Sit;Sit to Supine Rolling Right: Independent Rolling Left: Independent Supine to Sit: Independent Sit to Supine: Independent Transfers Transfers: Sit to Stand;Stand to Sit;Stand Pivot Transfers Sit to Stand: Independent Stand to Sit: Independent Stand Pivot Transfers: Independent Transfer (Assistive device): None Locomotion  Gait Ambulation: Yes Gait Assistance: Independent Gait Distance (Feet): 150 Feet Assistive device: None Gait Gait: Yes Gait Pattern: Within Functional Limits Gait velocity: Decreased Stairs / Additional Locomotion Stairs: Yes Stairs Assistance: Independent Stair Management Technique: One rail Left Number of Stairs: 12 Height of Stairs: 6 Ramp: Independent Curb: Independent Wheelchair Mobility Wheelchair Mobility: No  Trunk/Postural Assessment  Cervical Assessment Cervical Assessment: Within Functional Limits Thoracic Assessment Thoracic Assessment: Within Functional Limits Lumbar Assessment Lumbar Assessment: Within Functional Limits Postural Control Postural Control: Within Functional Limits  Balance Balance Balance Assessed: Yes Standardized Balance Assessment Standardized Balance Assessment: Berg Balance Test Berg Balance Test Sit to Stand: Able to stand without using hands and stabilize independently Standing Unsupported: Able to stand safely 2 minutes Sitting with Back Unsupported but Feet Supported on Floor or Stool: Able to sit safely and securely 2 minutes Stand to Sit: Sits safely with minimal use of hands Transfers: Able to transfer safely, minor use of hands Standing Unsupported with Eyes Closed: Able to stand 10 seconds safely Standing Ubsupported with Feet Together: Able to place feet together independently and stand 1 minute safely From Standing,  Reach Forward with Outstretched Arm: Can reach confidently >25 cm (10") From Standing Position, Pick up Object from Floor: Able to pick up shoe safely and easily From Standing Position, Turn to Look Behind Over each Shoulder: Looks behind from both sides and weight shifts well Turn 360 Degrees: Able to turn 360 degrees safely in 4 seconds or less Standing Unsupported, Alternately Place Feet on Step/Stool: Able to stand independently and safely and complete  8 steps in 20 seconds Standing Unsupported, One Foot in Front: Able to plae foot ahead of the other independently and hold 30 seconds Standing on One Leg: Able to lift leg independently and hold equal to or more than 3 seconds Total Score: 53 Static Sitting Balance Static Sitting - Balance Support: Feet supported Static Sitting - Level of Assistance: 7: Independent Dynamic Sitting Balance Dynamic Sitting - Balance Support: During functional activity Dynamic Sitting - Level of Assistance: 7: Independent Static Standing Balance Static Standing - Balance Support: During functional activity Static Standing - Level of Assistance: 7: Independent Dynamic Standing Balance Dynamic Standing - Balance Support: During functional activity Dynamic Standing - Level of Assistance: 6: Modified independent (Device/Increase time) Extremity Assessment      RLE Assessment RLE Assessment: Within Functional Limits LLE Assessment LLE Assessment: Within Functional Limits   Megan Reid 12/16/2021, 9:33 AM

## 2021-12-16 NOTE — Progress Notes (Signed)
Occupational Therapy Session Note  Patient Details  Name: Megan Reid MRN: [id number] Date of Birth: 11-Apr-1966  Today's Date: 12/16/2021 OT Individual Time: 9937-1696 OT Individual Time Calculation (min): 50 min  and Today's Date: 12/16/2021 OT Missed Time: 10 Minutes Missed Time Reason: Other (comment) (IV placement)   Short Term Goals: Week 1:  OT Short Term Goal 1 (Week 1): STG= LTG d/t ELOS  Skilled Therapeutic Interventions/Progress Updates:    1:1. Pt received in EOB with husband present missing 10 min of skilled OT this session d/t IV placement. Pt agreeable to OT focusing on dynamic balance, reactive stepping, dual task training and resistance exercise. Pt completes forward/backwards/lateral walking/stepping against resistance of green bands with perturbations in all directions with no total LOB with good reactive stepping strategies noted. Pt reporting it was very challenging and "burning those hips." Pt requires seated rest break d/t decreased activity tolerance while discussing return to work and expecations with good insight from pt on current limitations. Pt then completes reactive stepping work laterall and posteriorly with band around bottom of mat pulling pt foot out from under pt to challenge stepping strategy with no LOB but pt reporting challenging. Exited session with pt seated in EOB, exit alarm on and call light in reach   Therapy Documentation Precautions:  Precautions Precautions: Fall Precaution Comments: platelets <75k Restrictions Weight Bearing Restrictions: No General: General OT Amount of Missed Time: 10 Minutes  Therapy/Group: Individual Therapy  Shon Hale 12/16/2021, 1:57 PM

## 2021-12-16 NOTE — Progress Notes (Signed)
Speech Language Pathology Daily Session Note  Patient Details  Name: Megan Reid MRN: [id number] Date of Birth: Jul 15, 1965  Today's Date: 12/16/2021 SLP Individual Time: 1445-1530 SLP Individual Time Calculation (min): 45 min  Short Term Goals: Week 1: SLP Short Term Goal 1 (Week 1): STG = LTG d/t ELOS  Skilled Therapeutic Interventions: Skilled ST treatment focused on cognitive goals. Pt received semi reclined in bed on arrival and agreeable to ST intervention despite sleepiness. SLP facilitated session by providing a complex problem solving and organization task in which the patient had to generate a schedule when given a list of appointments, errands and time constraints. Pt completed task with supervision A verbal cues to prioritize appointments, locate, and apply pertinent information from paragraph to daily schedule. Pt required sup A verbal cues to identify and repair errors and benefited from additional processing time to effectively rearrange information. SLP also facilitated education and demonstration on organization and memory strategies using "notes" app on phone. Pt returned demonstration with mod I and plans to explore other options on phone to help manage to-do lists, appointments, etc. Patient was left in bed with alarm activated and immediate needs within reach at end of session. Continue per current plan of care.       Pain None/denied   Therapy/Group: Individual Therapy  Tamala Ser 12/16/2021, 4:29 PM

## 2021-12-16 NOTE — Progress Notes (Signed)
Speech Language Pathology Discharge Summary  Patient Details  Name: Megan Reid MRN: [id number] Date of Birth: [birth year]  Date of Discharge from SLP service:December 17, 2021  {chl ip rehab slp time calculations:[id number]}   Skilled Therapeutic Interventions:  ***   Patient has met 3 of 3 long term goals.  Patient to discharge at overall Supervision level.  Reasons goals not met: all goals met   Clinical Impression/Discharge Summary: Patient has made excellent gains and has met 3 of 3 long-term goals this admission due to improved cognitive-linguistic skills in the areas of memory and higher level problem solving. Patient is currently completing functional and complex cognitive tasks at the supervision level. Patient and family education is complete and patient to discharge at overall supervision level. Patient's care partner is independent to provide the necessary physical and cognitive assistance at discharge. Patient would benefit from continued SLP services in outpatient setting to maximize cognitive function and functional independence. Pt remains highly motivated to improve cognitive-linguistic skills to manage iADLs more independently and eventually return to work when appropriate.    Care Partner:  Caregiver Able to Provide Assistance: Yes  Type of Caregiver Assistance: Physical;Cognitive  Recommendation:  24 hour supervision/assistance;Outpatient SLP  Rationale for SLP Follow Up: Maximize cognitive function and independence;Reduce caregiver burden   Equipment: NA   Reasons for discharge: Treatment goals met   Patient/Family Agrees with Progress Made and Goals Achieved: Yes    Candy Ziegler T Evalin Shawhan 12/16/2021, 4:34 PM

## 2021-12-16 NOTE — Discharge Summary (Signed)
Physician Discharge Summary  Patient ID: Megan Reid MRN: [id number] DOB/AGE: 1965-08-24 56 y.o.  Admit date: 12/12/2021 Discharge date: 12/16/2021  Discharge Diagnoses:  Principal Problem:   Toxic metabolic encephalopathy Active Problems:   Seasonal and perennial allergic rhinitis   Chronic coughing   Sjogren's syndrome (HCC)   Leukopenia   Anemia   Abnormal LFTs   Discharged Condition: stable  Significant Diagnostic Studies: N/A  Labs:  Basic Metabolic Panel: Recent Labs  Lab 12/10/21 0618 12/11/21 0334 12/12/21 0339 12/13/21 0520 12/16/21 0740  NA 137 136 137 140  --   K 3.3* 3.3* 4.0 3.9  --   CL 105 104 110 109  --   CO2 25 25 23 24   --   GLUCOSE 108* 109* 99 96  --   BUN 55* 48* 38* 30*  --   CREATININE 2.01* 1.71* 1.51* 1.37*  --   CALCIUM 7.7* 7.8* 8.1* 8.3*  --   MG 2.2 1.8  --  1.3* 1.2*  PHOS 2.3* 4.4  --   --   --     CBC: Recent Labs  Lab 12/12/21 0339 12/13/21 0520 12/14/21 0938  WBC 1.8* 3.1* 3.1*  NEUTROABS 1.1* 1.7  --   HGB 8.0* 7.8* 7.6*  HCT 24.5* 23.8* 23.2*  MCV 86.3 85.3 86.2  PLT 62* 114* 182      Latest Ref Rng & Units 12/13/2021    5:20 AM 12/12/2021    3:39 AM 12/11/2021    3:34 AM  Hepatic Function  Total Protein 6.5 - 8.1 g/dL 6.2  6.2  6.2   Albumin 3.5 - 5.0 g/dL 1.9  1.9  1.8   AST 15 - 41 U/L 144  240  395   ALT 0 - 44 U/L 237  313  399   Alk Phosphatase 38 - 126 U/L 118  119  124   Total Bilirubin 0.3 - 1.2 mg/dL 1.3  1.4  1.6     CBG: No results for input(s): "GLUCAP" in the last 168 hours.  Brief HPI:   Megan Reid is a 56 y.o. female with history of DDD lumbar spine, asthma, autoimmune disease, Sjogren's with recent onset of trigeminal neuralgia and was started on Tegretol.  She was also reported to have been taking ibuprofen as well as Tylenol for pain management.  She was found to have metabolic encephalopathy with acute hepatic and renal failure.  Renal failure treated with IV fluids and bicarb .  AKI  felt to be due to ATN from acute illness, diarrhea and ibuprofen use.  She developed fever past admission and was treated with IV antibiotics due to concerns of sepsis.  Work-up was negative for DR ESS, tickborne illness, CMV, EBV and acute hepatitis/pancytopenia felt to be secondary to Tegretol toxicity.  She did not have pancytopenia and supportive care was recommended by heme-onc.  Renal status and abnormal LFTs were resolving however patient continued limited by weakness, DOE as well as encephalopathy with delayed processing.  CIR was recommended due to functional decline.   Hospital Course: Megan Reid was admitted to rehab 12/12/2021 for inpatient therapies to consist of PT, ST and OT at least three hours five days a week. Past admission physiatrist, therapy team and rehab RN have worked together to provide customized collaborative inpatient rehab.  Her blood pressures were monitored on TID basis and has been stable.  Follow-up check of electrolytes revealed renal status as well as abnormal LFTs to be gradually improving.  She was found to have drop in magnesium to 1.3 requiring IV supplementation as well as p.o. supplement.  Repeat check showed improvement with magnesium of 2.0.  Recommend repeat check in 1 to 2 weeks to monitor for stability as well as need to continue oral supplementation.  Follow-up CBC showed improvement in white count with stable H&H and that thrombocytopenia had resolved.  Her alertness and mentation has gradually improved with improvement in activity tolerance and respiratory status.  Hydroxyzine 10 mg 3 times daily as needed was added to help manage anxiety.  Sleep-wake disruption is improved with addition of melatonin.  Constipation has resolved with augmentation of bowel regimen.  She has made steady gains during her rehab stay and is modified independent at discharge.  Supervision is recommended with cognitive task and for safety.  She will continue to receive follow-up  outpatient PT, OT and ST at [facility] outpatient rehab after discharge.   Rehab course: During patient's stay in rehab weekly team conferences were held to monitor patient's progress, set goals and discuss barriers to discharge. At admission, patient required min assist with mobility and with ADL tasks.  She exhibited mild cognitive deficits affecting recall, memory and executive function with SLUMS score 22/30. She  has had improvement in activity tolerance, balance, postural control as well as ability to compensate for deficits. She is able to complete ADL tasks at modified independent level. She is independent for transfers and is able to ambulate 150' without AD. She is able to climb 12 stairs independently. She requires supervision with functional and complex cognitive tasks. Her SLUMS score has improved to 26/30. Family education has been completed.    Disposition: Home  Diet: Regular  Special Instructions: 1. No driving till cleared by MD.  2. Repeat Mg Level in 1-2 weeks to monitor for stability/need for supplementation. 3. Recheck LFTs and CBC in 1-2 weeks.  Allergies as of 12/18/2021       Reactions   Sulfa Antibiotics Anaphylaxis, Hives, Swelling   Swelling of the face and tongue    Tegretol [carbamazepine] Other (See Comments)   Suspected hepatotoxicity and pancytopenia, required inpatient admission 11/2021        Medication List     STOP taking these medications    rosuvastatin 20 MG tablet Commonly known as: CRESTOR   Xolair 150 MG/ML prefilled syringe Generic drug: omalizumab   Xolair 75 MG/0.5ML prefilled syringe Generic drug: omalizumab       TAKE these medications    acetaminophen 325 MG tablet Commonly known as: TYLENOL Take 1-2 tablets (325-650 mg total) by mouth every 4 (four) hours as needed for mild pain.   AeroChamber Plus inhaler Use as instructed   albuterol 108 (90 Base) MCG/ACT inhaler Commonly known as: VENTOLIN HFA Inhale 2 puffs into  the lungs 2 (two) times daily. For shortness of breath   fluticasone 50 MCG/ACT nasal spray Commonly known as: FLONASE Place 2 sprays into both nostrils daily.   hydrOXYzine 10 MG tablet Commonly known as: ATARAX Take 1 tablet (10 mg total) by mouth 3 (three) times daily as needed for anxiety.   magnesium oxide 400 (240 Mg) MG tablet Commonly known as: MAG-OX Take 2 tablets (800 mg total) by mouth 2 (two) times daily.   melatonin 5 MG Tabs Take 1 tablet (5 mg total) by mouth at bedtime as needed.   metoprolol tartrate 25 MG tablet Commonly known as: LOPRESSOR Take 1 tablet (25 mg total) by mouth 2 (two) times daily.  montelukast 10 MG tablet Commonly known as: Singulair Take 1 tablet (10 mg total) by mouth at bedtime.   multivitamin with minerals Tabs tablet Take 1 tablet by mouth daily.   pantoprazole 40 MG tablet Commonly known as: Protonix Take 1 tablet (40 mg total) by mouth daily.   polyethylene glycol 17 g packet Commonly known as: MIRALAX / GLYCOLAX Take 17 g by mouth every other day. What changed:  when to take this reasons to take this   Symbicort 160-4.5 MCG/ACT inhaler Generic drug: budesonide-formoterol INHALE 2 PUFFS INTO THE LUNGS TWICE DAILY What changed: when to take this        Follow-up Information     Leeroy Cha, MD Follow up.   Specialty: Internal Medicine Why: Call in 1-2 days for post hospital follow up Contact information: [address] [place] [address] [phone]         Jennye Boroughs, MD Follow up.   Specialty: Physical Medicine and Rehabilitation Why: office will call you with follow up appointment Contact information: [address] [place] [address] [phone]                 Signed: Bary Leriche 12/20/2021, 7:11 PM

## 2021-12-16 NOTE — Discharge Instructions (Addendum)
Inpatient Rehab Discharge Instructions  Megan Reid Discharge date and time:  12/18/21  Activities/Precautions/ Functional Status: Activity: no lifting, driving, or strenuous exercise till cleared by MD Diet: regular diet Wound Care: none needed   Functional status:  ___ No restrictions     ___ Walk up steps independently ___ 24/7 supervision/assistance   ___ Walk up steps with assistance _X__ Intermittent supervision/assistance  ___ Bathe/dress independently ___ Walk with walker     _X__ Bathe/dress with assistance ___ Walk Independently    ___ Shower independently ___ Walk with assistance    ___ Shower with assistance _X__ No alcohol     ___ Return to work/school ________  Special Instructions: Needs supervision when out of home.  No driving till cleared by MD.    COMMUNITY REFERRALS UPON DISCHARGE:     Outpatient: PT  OT  SP             Agency:[facility]  [address] [place] [address]   Phone: [phone]             Appointment Date/Time: WILL CALL TO SET UP FOLLOW UP APPOINTMENTS  Medical Equipment/Items Ordered:NONE NEEDED                                                 Agency/Supplier:NA   My questions have been answered and I understand these instructions. I will adhere to these goals and the provided educational materials after my discharge from the hospital.  Patient/Caregiver Signature _______________________________ Date __________  Clinician Signature _______________________________________ Date __________  Please bring this form and your medication list with you to all your follow-up doctor's appointments.

## 2021-12-16 NOTE — Progress Notes (Signed)
Physical Therapy Session Note  Patient Details  Name: Megan Reid MRN: [id number] Date of Birth: [birth year]  Today's Date: 12/16/2021 PT Individual Time: 1131-1210 PT Individual Time Calculation (min): 39 min   Short Term Goals: Week 1:  PT Short Term Goal 1 (Week 1): STG=LTG due to ELOS   Skilled Therapeutic Interventions/Progress Updates:  Patient seated reclined in recliner on entrance to room. Patient alert and agreeable to PT session. Although relates she did not sleep well d/t another pt yelling all night long.   Patient with no pain complaint at start of session.  Therapeutic Activity: Transfers: Pt performed sit<>stand and stand pivot transfers throughout session with Mod I using hands briefly for balance.   Gait Training/NMR:  Pt ambulated in main therapy gym with focus on maintaining balance with variable resistance provided at hips through gait belt. Pt ambulates forward and backward with slip-trip resistance using good balance strategies to maintain balance throughout with no crossover stepping or LOB. Ambulated with variable resistance to gait belt in slip/ trip and lateral resistance while ambulating to touch R/ L toe to 6" cones placed to R and L sides of therapy gym. Again, no LOB or crossover stepping noted with good stepping strategy throughout to maintain balance.   Pt states desire to complete 12 steps. Ambulated to therapy steps in gym and pt able to ascend/ descend 4 steps x3 bouts with distant supervision and good balance using BHR and one sided HR. 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   Neuromuscular Re-ed: NMR facilitated during session with focus on dynamic stepping using star drill and color dots on floor. Pt guided in forward lunges with reach to 6" cones on tables diagonally to R and L having to coordinate hand/ reach with call of color target and foot to perform lunge. Good balance  and choices made throughout. No LOB with good strength in BLE. Performs x6 at end of bout with hodlto 2# weighted ball and tapping to cones on tables. NMR performed for improvements in motor control and coordination, balance, sequencing, judgement, and self confidence/ efficacy in performing all aspects of mobility at highest level of independence.   Patient seated EOB at end of session with brakes locked, no alarm set, and all needs within reach. IV team present to start IV line for pt. RN notified of IV team presence.    Therapy Documentation Precautions:  Precautions Precautions: Fall Precaution Comments: platelets <75k Restrictions Weight Bearing Restrictions: No General:   Vital Signs:   Pain:  No pain related this session.  Therapy/Group: Individual Therapy  Loel Dubonnet PT, DPT, CSRS 12/16/2021, 4:40 PM

## 2021-12-16 NOTE — Progress Notes (Signed)
IVT consulted for difficult PIV placement.  Pt eating lunch and request we come back in 15-20mins.

## 2021-12-16 NOTE — Progress Notes (Addendum)
PROGRESS NOTE   Subjective/Complaints:  Pt reports she did not sleep well last night due to noise. No additional concerns or complaints.   ROS: Denies fevers, chills, N/V, abdominal pain, constipation, diarrhea, SOB, cough, chest pain, HA, new weakness or paraesthesias.    Objective:   No results found. Recent Labs    12/14/21 0938  WBC 3.1*  HGB 7.6*  HCT 23.2*  PLT 182    No results for input(s): "NA", "K", "CL", "CO2", "GLUCOSE", "BUN", "CREATININE", "CALCIUM" in the last 72 hours.   Intake/Output Summary (Last 24 hours) at 12/16/2021 0801 Last data filed at 12/15/2021 1828 Gross per 24 hour  Intake 1046 ml  Output --  Net 1046 ml         Physical Exam: Vital Signs Blood pressure 115/75, pulse 91, temperature 98.4 F (36.9 C), temperature source Oral, resp. rate 16, height 5\' 10"  (1.778 m), weight 73.2 kg, last menstrual period 05/22/2012, SpO2 100 %. Constitutional: No apparent distress. Appropriate appearance for age.  HENT: Atraumatic, normocephalic. Eyes: PERRLA. Conjugate Gaze Cardiovascular: RRR, no murmurs/rub/gallops. No Edema.  Respiratory: CTAB. No rales, rhonchi, or wheezing. Good air movement, On RA.  Abdomen: + bowel sounds, normoactive. No distention or tenderness.  Skin: bruise along left inner arm stable MSK:      No apparent deformity.      Strength: Antigravity and against resistance in all 4 extremities.                 Neurologic exam:  Cognition: AAO to person, place, time and event.  Language: Fluent, No substitutions or neoglisms. No dysarthria.  Memory: Improving recall and processing speed.  Insight: Good insight into current condition.  Mood: Pleasant affect, appropriate mood. CN: 2-12 grossly intact.  Coordination: No apparent tremors.      Assessment/Plan: 1. Functional deficits which require 3+ hours per day of interdisciplinary therapy in a comprehensive inpatient  rehab setting. Physiatrist is providing close team supervision and 24 hour management of active medical problems listed below. Physiatrist and rehab team continue to assess barriers to discharge/monitor patient progress toward functional and medical goals  Care Tool:  Bathing    Body parts bathed by patient: Right arm, Left arm, Right lower leg, Left lower leg, Chest, Face, Abdomen, Front perineal area, Buttocks, Right upper leg, Left upper leg         Bathing assist Assist Level: Independent     Upper Body Dressing/Undressing Upper body dressing   What is the patient wearing?: Pull over shirt, Bra    Upper body assist Assist Level: Independent    Lower Body Dressing/Undressing Lower body dressing      What is the patient wearing?: Underwear/pull up, Pants     Lower body assist Assist for lower body dressing: Independent     Toileting Toileting    Toileting assist Assist for toileting: Independent     Transfers Chair/bed transfer  Transfers assist  Chair/bed transfer activity did not occur: Safety/medical concerns  Chair/bed transfer assist level: Independent     Locomotion Ambulation   Ambulation assist      Assist level: Minimal Assistance - Patient > 75% Assistive device: No Device  Max distance: >150'   Walk 10 feet activity   Assist     Assist level: Contact Guard/Touching assist Assistive device: No Device   Walk 50 feet activity   Assist    Assist level: Minimal Assistance - Patient > 75% Assistive device: No Device    Walk 150 feet activity   Assist    Assist level: Minimal Assistance - Patient > 75% Assistive device: No Device    Walk 10 feet on uneven surface  activity   Assist     Assist level: Minimal Assistance - Patient > 75% Assistive device: Other (comment) (No AD)   Wheelchair     Assist Is the patient using a wheelchair?: No             Wheelchair 50 feet with 2 turns  activity    Assist            Wheelchair 150 feet activity     Assist          Blood pressure 115/75, pulse 91, temperature 98.4 F (36.9 C), temperature source Oral, resp. rate 16, height  (1.778 m), weight 73.2 kg, last menstrual period 05/22/2012, SpO2 100 %.  Medical Problem List and Plan: 1. Functional deficits secondary to encephalopathy secondary to SIRS             -patient may  shower             -ELOS/Goals: 7-10d, Mod I ADL and mobility  2.  Antithrombotics: -DVT/anticoagulation:  Mechanical: Sequential compression devices, entire leg Bilateral lower extremities             -antiplatelet therapy: N/A 3. Pain Management: Local measures for now.  4. Mood/Behavior/Sleep: LCSW to follow for evaluation and support.              -antipsychotic agents: N/A            - Pt endorses poor sleep d/t racing thoughts, hospitalization; Trazadone 25-50 mg PRN  - not using, change to PRN melatonin 5 mg QHS 8/30            - DC albuterol 8/30 d/t anxiety and tachycardia            - Hydroxyzine 10 mg TID PRN for anxiety added 8/30              5. Neuropsych/cognition: This patient is not fully capable of making decisions on her own behalf. 6. Skin/Wound Care: Routine pressure relief measures.             -- Lidocaine patch to L inner arm bruise 8/30 7. Fluids/Electrolytes/Nutrition: Continue to monitor renal status/LFTs closely.              --recheck CBC/CMET in am - CMET stable             - - Mg 1.3 on intake, ordered oral repletion 8. Sjogren's: Treated with Xolair every 14 days.  9. Abnormal LFTs: Much improved. AST 240, ALT 313, TB 1.4. INR- 1.2             --ammonia level 36-->53-->37--> 41 8/29, stable   -- Recheck LFTs --> downtrending 10 AKI: Resolving. BUN/SCR improved from 95/4.3-->38/1.51 --> 30/1.37 --Imporoving 11. Acute on chronic thrombocytopenia: Improving overall--followed by Dr. Clelia Croft.               --Intermittent hematuria documented 8/25,  8/26, 8/27              --  Labs below pre-admit-->admit--Nadir-->current admit --Platelets (121)  41---> 11--->39--> 114; improving --WBC (4.0) 1.8--->1.5-->1.6 --> 3.1; improving --Hgb (11.6)  13.6-->8.2 --> 7.8 --> 7.6, stable 8/30 --Pancytopenia improving -- Request maintenance pneumonia vaccination be held until OP f/u, in setting of pancytopenia   12. HTN: Monitor BP TID. Continue to hold HCTZ.             --on metoprolol BID-->question affecting respiratory status.  -Well controlled  13. H/o Asthma: On singulair. Resume MDI as SOB/increased WOB reported with minimal activity.               -- DC albuterol 8/30 due to tachycardia, anxiety  14. Constipation: Will start Miralax every other day. Resolved.    -Last BM 9/1 Improved 15. Hypomagnesemia  -9/1 MG 1.2 today, Discussed with pharmacy-appreciate assistance, 4g IV MG ordered, recheck tomorrow   LOS: 4 days A FACE TO FACE EVALUATION WAS PERFORMED  Fanny Dance 12/16/2021, 8:01 AM

## 2021-12-16 NOTE — Progress Notes (Signed)
Physical Therapy Session Note  Patient Details  Name: Megan Reid MRN: [id number] Date of Birth: [birth year]  Today's Date: 12/16/2021 PT Individual Time: 0900-1015 PT Individual Time Calculation (min): 75 min   Short Term Goals: Week 1:  PT Short Term Goal 1 (Week 1): STG=LTG due to ELOS  Skilled Therapeutic Interventions/Progress Updates:  Patient greeted sitting in bedside recliner and agreeable to PT treatment session. Patient reporting increased fatigue this morning secondary to not sleeping well last night.  High-Level Gait Training- Patient ambulated to/from her room and day room rehab gym without an AD and distant supervision. When ambulating back to her room, patient was tasked with increased cadence with good improvements noted and no LOB.   Patient ambulated between a table and mat table picking up objects of varying size and weight from the table and carrying them to the mat table- Performed in order to improved dynamic stability and address difficulty when turning. Also performed to simulate ambulating throughout her classroom, performed activity with 10 items and supervision from therapist.  Patient then picked up all the above items and carried them throughout the gym, making ~4 trips, and placed them in their correct home.    Dynamic Stability Activities- Berg Balance assessment completed- see below for further details.   Patient sat on swiss ball while tossing 4# medicine ball back and forth with therapist, x20 tosses.  Patient stood on airex foam while tossing 4# medicine ball back and forth with therapist, x20 tosses.  Patient performed x10 sit/stands without UE support with verbal cues and demonstration for scooting forward, tucking feet underneath her and increased anterior lean due to her height. Significant improvements noted throughout the remainder of treatment session.   Cognitive dual-tasking- Patient ambulated around day room loop (~150'), x5 while  bouncing a ball and naming 10 animal, states, women basketball teams, etc. Patient required an extended seated rest break after this activity.    Patient returned to her room with daughter present and given in-room privileges: independent in the room without an AD with all team members notified.   Therapy Documentation Precautions:  Precautions Precautions: Fall Precaution Comments: platelets <75k Restrictions Weight Bearing Restrictions: No  Balance: Balance Balance Assessed: Yes Standardized Balance Assessment Standardized Balance Assessment: Berg Balance Test Berg Balance Test Sit to Stand: Able to stand without using hands and stabilize independently Standing Unsupported: Able to stand safely 2 minutes Sitting with Back Unsupported but Feet Supported on Floor or Stool: Able to sit safely and securely 2 minutes Stand to Sit: Sits safely with minimal use of hands Transfers: Able to transfer safely, minor use of hands Standing Unsupported with Eyes Closed: Able to stand 10 seconds safely Standing Ubsupported with Feet Together: Able to place feet together independently and stand 1 minute safely From Standing, Reach Forward with Outstretched Arm: Can reach confidently >25 cm (10") From Standing Position, Pick up Object from Floor: Able to pick up shoe safely and easily From Standing Position, Turn to Look Behind Over each Shoulder: Looks behind from both sides and weight shifts well Turn 360 Degrees: Able to turn 360 degrees safely in 4 seconds or less Standing Unsupported, Alternately Place Feet on Step/Stool: Able to stand independently and safely and complete 8 steps in 20 seconds Standing Unsupported, One Foot in Front: Able to plae foot ahead of the other independently and hold 30 seconds Standing on One Leg: Able to lift leg independently and hold equal to or more than 3 seconds Total Score: 53 Static  Sitting Balance Static Sitting - Balance Support: Feet supported Static  Sitting - Level of Assistance: 7: Independent Dynamic Sitting Balance Dynamic Sitting - Balance Support: During functional activity Dynamic Sitting - Level of Assistance: 7: Independent Static Standing Balance Static Standing - Balance Support: During functional activity Static Standing - Level of Assistance: 7: Independent Dynamic Standing Balance Dynamic Standing - Balance Support: During functional activity Dynamic Standing - Level of Assistance: 6: Modified independent (Device/Increase time)    Therapy/Group: Individual Therapy  Delara Shepheard 12/16/2021, 9:34 AM

## 2021-12-17 LAB — MAGNESIUM: Magnesium: 2 mg/dL (ref 1.7–2.4)

## 2021-12-17 NOTE — Progress Notes (Signed)
Physical Therapy Session Note  Patient Details  Name: Megan Reid MRN: [id number] Date of Birth: Sep 20, 1965  Today's Date: 12/17/2021 PT Individual Time: 0600-4599 PT Individual Time Calculation (min): 90 min   Short Term Goals: Week 1:  PT Short Term Goal 1 (Week 1): STG=LTG due to ELOS Week 2:     Skilled Therapeutic Interventions/Progress Updates:   Pt received sitting in recliner and agreeable to PT. Pt performed *** transfer with *** and *** from PT for safety.   Gait training in hall.   Patient demonstrates increased fall risk as noted by score of 23/30 on  Functional Gait Assessment.   <22/30 = predictive of falls, <20/30 = fall in 6 months, <18/30 = predictive of falls in PD MCID: 5 points stroke population, 4 points geriatric population (ANPTA Core Set of Outcome Measures for Adults with Neurologic Conditions, 2018)  Dynamic balance training on wii sport standing on airex pad.   HEP with hand outr provided of [place] level A and B.   Patient returned to room and performed stand pivot to recliner with ***. Pt left sitting in recliner with call bell in reach and all needs met.        Therapy Documentation Precautions:  Precautions Precautions: Fall Precaution Comments: platelets <75k Restrictions Weight Bearing Restrictions: No General:   Vital Signs:  Pain: Pain Assessment Pain Scale: 0-10 Pain Score: 0-No pain Mobility:   Locomotion :    Trunk/Postural Assessment :    Balance: Standardized Balance Assessment Standardized Balance Assessment: Functional Gait Assessment Functional Gait  Assessment Gait assessed : Yes Gait Level Surface: Walks 20 ft in less than 5.5 sec, no assistive devices, good speed, no evidence for imbalance, normal gait pattern, deviates no more than 6 in outside of the 12 in walkway width. Change in Gait Speed: Able to smoothly change walking speed without loss of balance or gait deviation. Deviate no more than 6 in outside of the  12 in walkway width. Gait with Horizontal Head Turns: Performs head turns smoothly with slight change in gait velocity (eg, minor disruption to smooth gait path), deviates 6-10 in outside 12 in walkway width, or uses an assistive device. Gait with Vertical Head Turns: Performs task with slight change in gait velocity (eg, minor disruption to smooth gait path), deviates 6 - 10 in outside 12 in walkway width or uses assistive device Gait and Pivot Turn: Pivot turns safely within 3 sec and stops quickly with no loss of balance. Step Over Obstacle: Is able to step over one shoe box (4.5 in total height) without changing gait speed. No evidence of imbalance. Gait with Narrow Base of Support: Ambulates 4-7 steps. Gait with Eyes Closed: Walks 20 ft, uses assistive device, slower speed, mild gait deviations, deviates 6-10 in outside 12 in walkway width. Ambulates 20 ft in less than 9 sec but greater than 7 sec. Ambulating Backwards: Walks 20 ft, no assistive devices, good speed, no evidence for imbalance, normal gait Steps: Alternating feet, must use rail. Total Score: 23 Exercises:   Other Treatments:      Therapy/Group: Individual Therapy  Lorie Phenix 12/17/2021, 6:31 PM

## 2021-12-17 NOTE — Plan of Care (Signed)
  Problem: Sit to Stand Goal: LTG:  Patient will perform sit to stand with assistance level (PT) Description: LTG:  Patient will perform sit to stand with assistance level (PT) Outcome: Completed/Met   Problem: RH Bed to Chair Transfers Goal: LTG Patient will perform bed/chair transfers w/assist (PT) Description: LTG: Patient will perform bed to chair transfers with assistance (PT). Outcome: Completed/Met   Problem: RH Car Transfers Goal: LTG Patient will perform car transfers with assist (PT) Description: LTG: Patient will perform car transfers with assistance (PT). Outcome: Completed/Met   Problem: RH Ambulation Goal: LTG Patient will ambulate in controlled environment (PT) Description: LTG: Patient will ambulate in a controlled environment, # of feet with assistance (PT). Outcome: Completed/Met Goal: LTG Patient will ambulate in home environment (PT) Description: LTG: Patient will ambulate in home environment, # of feet with assistance (PT). Outcome: Completed/Met Goal: LTG Patient will ambulate in community environment (PT) Description: LTG: Patient will ambulate in community environment, # of feet with assistance (PT). Outcome: Completed/Met   Problem: RH Stairs Goal: LTG Patient will ambulate up and down stairs w/assist (PT) Description: LTG: Patient will ambulate up and down # of stairs with assistance (PT) Outcome: Completed/Met

## 2021-12-17 NOTE — Progress Notes (Signed)
Occupational Therapy Session Note  Patient Details  Name: Megan Reid MRN: [id number] Date of Birth: 02-Jun-1965  Today's Date: 12/17/2021 OT Individual Time: 0092-3300 OT Individual Time Calculation (min): 60 min   Today's Date: 12/17/2021 OT Individual Time: 1245-1315 OT Individual Time Calculation (min): 30 min  Short Term Goals: Week 1:  OT Short Term Goal 1 (Week 1): STG= LTG d/t ELOS  Skilled Therapeutic Interventions/Progress Updates:    Session 1: Pt received in EOB after pt independently had showered, dressed and groomed prior to session. Pt requesting to focus on community level mobility and agreeable to outside  Therapeutic activity Focus of session on outside mobility up and down hills, different terrain, crossing the street, getting up and down from ground, taking leaning rest breaks and walking in outisde park across from hospital. Pt educated on pacing herself, gradually grading up exercise, taking adequate rest breaks and on energy conservation. Pt voicing anxiety about going home and OT encourages pt to talk to providers about getting referral for psych consult to work on coping after medical event. Pt verbalized understanding. Energy conservation hadout provided and OT going to address IADLs in second session.   Pt left at end of session in EOB with daughter in room  Session 2:  Pt received in recliner with no pain  Therapeutic activity Pt eating lunch therefore does not want to get out and practice IADLs but reviewed energy conservaiton handout and discussed examples of energy conservation strategies for IADLs and leisure activities. Pt also discusses travel with OT and OT educates in resources/energy conservation strategies for traveling.  Pt left at end of session in recliner with exit alarm on, call light in reach and all needs met   Therapy Documentation Precautions:  Precautions Precautions: Fall Precaution Comments: platelets <75k Restrictions Weight  Bearing Restrictions: No General:   Therapy/Group: Individual Therapy  Tonny Branch 12/17/2021, 6:49 AM

## 2021-12-17 NOTE — Progress Notes (Signed)
PROGRESS NOTE   Subjective/Complaints:  Pt reports no issues- but tired of meds- wants to do things "natural"- expalined sometimes can reduce meds, but I dont' think in her case, we can go to zero meds.   Walked on uneven terrain with therapy today- of note, LBM today.  ROS:  Pt denies SOB, abd pain, CP, N/V/C/D, and vision changes  Objective:   No results found. No results for input(s): "WBC", "HGB", "HCT", "PLT" in the last 72 hours. No results for input(s): "NA", "K", "CL", "CO2", "GLUCOSE", "BUN", "CREATININE", "CALCIUM" in the last 72 hours.   Intake/Output Summary (Last 24 hours) at 12/17/2021 1631 Last data filed at 12/17/2021 1513 Gross per 24 hour  Intake 980 ml  Output 1 ml  Net 979 ml        Physical Exam: Vital Signs Blood pressure 116/83, pulse (!) 104, temperature 98.3 F (36.8 C), resp. rate 18, height 5\' 10"  (1.778 m), weight 73.2 kg, last menstrual period 05/22/2012, SpO2 100 %.   General: awake, alert, appropriate, sitting EOB; daughter at bedside; NAD HENT: conjugate gaze; oropharynx moist CV: regular to borderline tachycardic rate; no JVD Pulmonary: CTA B/L; no W/R/R- good air movement GI: soft, NT, ND, (+)BS Psychiatric: appropriate- bright affect Neurological: Ox3 Sitting on EOB with daughter at bedside Skin: bruise along left inner arm stable MSK:      No apparent deformity.      Strength: Antigravity and against resistance in all 4 extremities.                 Neurologic exam:  Cognition: AAO to person, place, time and event.  Language: Fluent, No substitutions or neoglisms. No dysarthria.  Memory: Improving recall and processing speed.  Insight: Good insight into current condition.  Mood: Pleasant affect, appropriate mood. CN: 2-12 grossly intact.  Coordination: No apparent tremors.      Assessment/Plan: 1. Functional deficits which require 3+ hours per day of interdisciplinary  therapy in a comprehensive inpatient rehab setting. Physiatrist is providing close team supervision and 24 hour management of active medical problems listed below. Physiatrist and rehab team continue to assess barriers to discharge/monitor patient progress toward functional and medical goals  Care Tool:  Bathing    Body parts bathed by patient: Right arm, Left arm, Right lower leg, Left lower leg, Chest, Face, Abdomen, Front perineal area, Buttocks, Right upper leg, Left upper leg         Bathing assist Assist Level: Independent     Upper Body Dressing/Undressing Upper body dressing   What is the patient wearing?: Pull over shirt, Bra    Upper body assist Assist Level: Independent    Lower Body Dressing/Undressing Lower body dressing      What is the patient wearing?: Underwear/pull up, Pants     Lower body assist Assist for lower body dressing: Independent     Toileting Toileting    Toileting assist Assist for toileting: Independent     Transfers Chair/bed transfer  Transfers assist  Chair/bed transfer activity did not occur: Safety/medical concerns  Chair/bed transfer assist level: Independent     Locomotion Ambulation   Ambulation assist  Assist level: Independent Assistive device: No Device Max distance: >150'   Walk 10 feet activity   Assist     Assist level: Independent Assistive device: No Device   Walk 50 feet activity   Assist    Assist level: Independent Assistive device: No Device    Walk 150 feet activity   Assist    Assist level: Independent Assistive device: No Device    Walk 10 feet on uneven surface  activity   Assist     Assist level: Independent Assistive device: Other (comment) (No AD)   Wheelchair     Assist Is the patient using a wheelchair?: No   Wheelchair activity did not occur: N/A         Wheelchair 50 feet with 2 turns activity    Assist    Wheelchair 50 feet with 2 turns  activity did not occur: N/A       Wheelchair 150 feet activity     Assist  Wheelchair 150 feet activity did not occur: N/A       Blood pressure 116/83, pulse (!) 104, temperature 98.3 F (36.8 C), resp. rate 18, height 5\' 10"  (1.778 m), weight 73.2 kg, last menstrual period 05/22/2012, SpO2 100 %.  Medical Problem List and Plan: 1. Functional deficits secondary to encephalopathy secondary to SIRS             -patient may  shower             -ELOS/Goals: 7-10d, Mod I ADL and mobility Con't CIR- d/c tomorrow  2.  Antithrombotics: -DVT/anticoagulation:  Mechanical: Sequential compression devices, entire leg Bilateral lower extremities             -antiplatelet therapy: N/A 3. Pain Management: Local measures for now.  4. Mood/Behavior/Sleep: LCSW to follow for evaluation and support.              -antipsychotic agents: N/A            - Pt endorses poor sleep d/t racing thoughts, hospitalization; Trazadone 25-50 mg PRN  - not using, change to PRN melatonin 5 mg QHS 8/30            - DC albuterol 8/30 d/t anxiety and tachycardia            - Hydroxyzine 10 mg TID PRN for anxiety added 8/30         9/2- doing ok/stable- con't regimen for now     5. Neuropsych/cognition: This patient is not fully capable of making decisions on her own behalf. 6. Skin/Wound Care: Routine pressure relief measures.             -- Lidocaine patch to L inner arm bruise 8/30 7. Fluids/Electrolytes/Nutrition: Continue to monitor renal status/LFTs closely.              --recheck CBC/CMET in am - CMET stable             - - Mg 1.3 on intake, ordered oral repletion 8. Sjogren's: Treated with Xolair every 14 days.  9. Abnormal LFTs: Much improved. AST 240, ALT 313, TB 1.4. INR- 1.2             --ammonia level 36-->53-->37--> 41 8/29, stable   -- Recheck LFTs --> downtrending 10 AKI: Resolving. BUN/SCR improved from 95/4.3-->38/1.51 --> 30/1.37 --Imporoving 11. Acute on chronic thrombocytopenia: Improving  overall--followed by Dr. 11-25-1978.               --Intermittent hematuria  documented 8/25, 8/26, 8/27              --Labs below pre-admit-->admit--Nadir-->current admit --Platelets (121)  41---> 11--->39--> 114; improving --WBC (4.0) 1.8--->1.5-->1.6 --> 3.1; improving --Hgb (11.6)  13.6-->8.2 --> 7.8 --> 7.6, stable 8/30 --Pancytopenia improving -- Request maintenance pneumonia vaccination be held until OP f/u, in setting of pancytopenia   12. HTN: Monitor BP TID. Continue to hold HCTZ.             --on metoprolol BID-->question affecting respiratory status.  -Well controlled  13. H/o Asthma: On singulair. Resume MDI as SOB/increased WOB reported with minimal activity.               -- DC albuterol 8/30 due to tachycardia, anxiety  14. Constipation: Will start Miralax every other day. Resolved.    -Last BM 9/1 Improved 15. Hypomagnesemia  -9/1 MG 1.2 today, Discussed with pharmacy-appreciate assistance, 4g IV MG ordered, recheck tomorrow  9/2- Mg up to 2.0- d/c tomorrow  Pt insistent wants to see someone that can help with using nutrition as medicine-   LOS: 5 days A FACE TO FACE EVALUATION WAS PERFORMED  Megan Reid 12/17/2021, 4:31 PM

## 2021-12-18 LAB — BASIC METABOLIC PANEL
Anion gap: 6 (ref 5–15)
BUN: 19 mg/dL (ref 6–20)
CO2: 23 mmol/L (ref 22–32)
Calcium: 8.6 mg/dL — ABNORMAL LOW (ref 8.9–10.3)
Chloride: 112 mmol/L — ABNORMAL HIGH (ref 98–111)
Creatinine, Ser: 1.15 mg/dL — ABNORMAL HIGH (ref 0.44–1.00)
GFR, Estimated: 56 mL/min — ABNORMAL LOW (ref >60)
Glucose, Bld: 90 mg/dL (ref 70–99)
Potassium: 4 mmol/L (ref 3.5–5.1)
Sodium: 141 mmol/L (ref 135–145)

## 2021-12-18 NOTE — Progress Notes (Signed)
Inpatient Rehabilitation Discharge Medication Review by a Pharmacist  A complete drug regimen review was completed for this patient to identify any potential clinically significant medication issues.  High Risk Drug Classes Is patient taking? Indication by Medication  Antipsychotic No   Anticoagulant No   Antibiotic No   Opioid No   Antiplatelet Yes   Hypoglycemics/insulin No   Vasoactive Medication Yes Metoprolol - BP  Chemotherapy No   Other Yes Albuterol, Symbicort, Singulair - asthma Fluticasone - allergic rhinitis Pantoprazole - GERD ppx Hydroxyzine-anxiety     Type of Medication Issue Identified Description of Issue Recommendation(s)  Drug Interaction(s) (clinically significant)     Duplicate Therapy     Allergy     No Medication Administration End Date     Incorrect Dose     Additional Drug Therapy Needed     Significant med changes from prior encounter (inform family/care partners about these prior to discharge). Carbamazepine discontinued (2/2 acute hepatitis)  Valacyclovir, hctz, omeprazole also discontinued D/c rosuv, xolair Communicate medication changes with patient/family at discharge  Other       Clinically significant medication issues were identified that warrant physician communication and completion of prescribed/recommended actions by midnight of the next day:  No  Name of provider notified for urgent issues identified:    Provider Method of Notification:     Pharmacist comments:    Time spent performing this drug regimen review (minutes): 20   Thank you for allowing pharmacy to be a part of this patient's care. Arabella Merles, PharmD. [facility] Acute Care PGY-1 12/18/2021 12:19 PM

## 2021-12-18 NOTE — Progress Notes (Signed)
INPATIENT REHABILITATION DISCHARGE NOTE   Discharge instructions by: PA  Verbalized understanding: yes  Skin care/Wound care healing? No wounds  Pain: none  IV's: none  Tubes/Drains: none  O2: room air  Safety instructions:  Patient belongings: sent home with patient   Discharged to: home  Discharged via: private vehicle   Notes:

## 2021-12-20 ENCOUNTER — Encounter: Payer: BC Managed Care – PPO | Admitting: Physical Therapy

## 2021-12-20 DIAGNOSIS — R7989 Other specified abnormal findings of blood chemistry: Secondary | ICD-10-CM

## 2021-12-20 DIAGNOSIS — D649 Anemia, unspecified: Secondary | ICD-10-CM

## 2021-12-21 ENCOUNTER — Encounter
Payer: BC Managed Care – PPO | Attending: Physical Medicine and Rehabilitation | Admitting: Physical Medicine and Rehabilitation

## 2021-12-21 ENCOUNTER — Encounter: Payer: Self-pay | Admitting: Physical Medicine and Rehabilitation

## 2021-12-21 VITALS — BP 120/80 | HR 94 | Ht 70.0 in | Wt 157.4 lb

## 2021-12-21 DIAGNOSIS — G9339 Other post infection and related fatigue syndromes: Secondary | ICD-10-CM | POA: Diagnosis not present

## 2021-12-21 DIAGNOSIS — G928 Other toxic encephalopathy: Secondary | ICD-10-CM | POA: Diagnosis not present

## 2021-12-21 NOTE — Assessment & Plan Note (Signed)
D/t Tegretol drug reaction  SLP, PT, and OT therapy orders re-submitted  Doing extremely well otherwise from a rehab perspective. Will follow up around 10/13 to re-assess need for work restrictions/accomodations prior to return date 10/20.

## 2021-12-21 NOTE — Assessment & Plan Note (Signed)
Continue adaptive strategies to avoid over-exertion.  Can continue melatonin PRN for sleep - overall doing much better.   Follow up PRN if any worsening or concerning symptoms.

## 2021-12-21 NOTE — Progress Notes (Signed)
Subjective:    Patient ID: Megan Reid, female    DOB: June 20, 1965, 56 y.o.   MRN: [id number]  HPI  Megan Reid is a 56 y.o. female with history of DDD lumbar spine, asthma, autoimmune disease, Sjogren's with recent onset of trigeminal neuralgia and was started on Tegretol, who presents as transition of care f/u after IPR admission 8/28-9/1.    Briefly, patient presented 9/79/89 with metabolic encephalopathy with acute hepatic and renal failure. AKI attributed to ATN from acute illness, diarrhea and ibuprofen use.  She developed fever, tx with IV abx, work-up was negative for DR ESS, tickborne illness, CMV, EBV and acute hepatitis/pancytopenia. Ultimately, symptoms were felt to be secondary to Tegretol toxicity. Pt developed pancytopenia and supportive care was recommended by heme-onc. Additionally had elevated LFTs, which self-resolved. She was admitted to rehab 12/12/2021-9/1. IPR stay was c/b drop in magnesium to 1.3 requiring IV supplementation as well as p.o. supplement, Hydroxyzine 10 mg 3 times daily as needed was added to help manage anxiety, Sleep-wake disruption is improved with addition of melatonin, and constipation resolved with augmentation of bowel regimen.  Pancytopenia, transaminitis, and AKI improved throughout rehab stay.  Interval Hx: - [place] did not receive referrals for PT, OT, or SLP therapies; patient has had no therapies since discharge.  - Overall, doing very well. Functionally independent in ADLs and mobility, with frequent rest breaks and set up to avoid fatigue (ex. Shower chair, sitting on step stool while washing dishes, etc). No equipment needs. No near falls.   - Cognition remains primary barrier. Patient is very good as using adaptive strategies such as writing down conversations and making to do lists. 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 She specifically is concerned regarding lesson plan preparation, answering questions accurately, and maintaining energy throughout the day.  - Sleeping well, no further insomnia. Does get anxious regarding "people in my close space not paying attention", fearful of being knocked over. Otherwise, no worsening anxiety or depression. Well supported by husband and children.   Pain Inventory Average Pain 0 Pain Right Now 0 My pain is  no pain  LOCATION OF PAIN  no pain  BOWEL Number of stools per week: 7-10   BLADDER Normal Bladder incontinence No  Frequent urination Yes  Leakage with coughing Yes  Difficulty starting stream No  Incomplete bladder emptying No    Mobility walk without assistance how many minutes can you walk? 3  Do you have any goals in this area?  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  yes  Neuro/Psych weakness numbness trouble walking dizziness confusion  Prior Studies Any changes since last visit?  no  Physicians involved in your care Any changes since last visit?  no   Family History  Problem Relation Age of Onset   Kidney failure Mother    Heart failure Mother    Lupus Mother    COPD Father    Hypertension Sister    Acromegaly Sister    Breast cancer Paternal Aunt 74   Social History   Socioeconomic History   Marital status: Married    Spouse name: Not on  file   Number of children: 4   Years of education: 16   Highest education level: Not on file  Occupational History   Occupation: teacher  Tobacco Use   Smoking status: Former    Packs/day: 0.50    Years: 2.00    Total pack years: 1.00    Types: Cigarettes    Quit date: 05/16/1986    Years since quitting: 35.6   Smokeless tobacco: Never   Tobacco comments:     TEEN AGER  Vaping Use   Vaping Use: Never used  Substance and Sexual Activity   Alcohol use: No   Drug use: No   Sexual activity: Not on file  Other Topics Concern   Not on file  Social History Narrative   Lives with husband.  Has 4 children.  Teacher.  Education: college.       Right Handed    Lives in a one story home    Drinks caffeine   Social Determinants of Health   Financial Resource Strain: Not on file  Food Insecurity: Not on file  Transportation Needs: Not on file  Physical Activity: Not on file  Stress: Not on file  Social Connections: Not on file   Past Surgical History:  Procedure Laterality Date   BREAST BIOPSY Left    ROTATOR CUFF REPAIR Right 06/16/2019   TUBAL LIGATION     Past Medical History:  Diagnosis Date   Arthralgia 02/14/2016   Asthma    Autoimmune disease ([place]) 02/14/2016   Positive RF 139 CCP Negative  Positive ANA 1:640 Positive Ro Positive [place] Elevated ESR 105 - Sjorgen's Disease   DDD (degenerative disc disease), lumbar 02/14/2016   Fatigue 02/14/2016   Gastritis 02/14/2016   Hot flashes    Migraines 02/14/2016   Osteoarthritis of both hands 02/14/2016   Osteoarthritis of both knees 02/14/2016   Vitamin D deficiency 02/14/2016   BP 120/80   Pulse 94   Ht 5' 10"  (1.778 m)   Wt 157 lb 6.4 oz (71.4 kg)   LMP 05/22/2012   SpO2 97%   BMI 22.58 kg/m   Opioid Risk Score:   Fall Risk Score:  `1  Depression screen PHQ 2/9     12/21/2021    1:01 PM 02/13/2017    3:21 PM  Depression screen PHQ 2/9  Decreased Interest 1 0  Down, Depressed, Hopeless 0 0  PHQ - 2 Score 1 0  Altered sleeping 2   Tired, decreased energy 3   Change in appetite 3   Feeling bad or failure about yourself  0   Trouble concentrating 2   Moving slowly or fidgety/restless 2   Suicidal thoughts 0   PHQ-9 Score 13      Review of Systems  Constitutional:  Positive for appetite change and chills.  HENT: Negative.    Eyes: Negative.   Respiratory:   Positive for cough.   Cardiovascular: Negative.   Gastrointestinal: Negative.   Endocrine: Negative.   Genitourinary:  Positive for frequency.  Musculoskeletal:  Positive for gait problem.  Skin: Negative.   Allergic/Immunologic: Negative.   Neurological:  Positive for weakness and numbness.  Hematological: Negative.   Psychiatric/Behavioral:  Positive for confusion.       Objective:   Physical Exam  Constitutional: No apparent distress. Appropriate appearance for age.  HENT: No JVD. Neck Supple. Trachea midline. Atraumatic, normocephalic. Eyes: PERRLA. EOMI. Visual fields grossly intact.  Cardiovascular: RRR, no murmurs/rub/gallops. No Edema. Peripheral pulses 2+  Respiratory:  CTAB. No rales, rhonchi, or wheezing. On RA.  Abdomen: + bowel sounds, normoactive. No distention or tenderness.  Skin: C/D/I. No apparent lesions. MSK:      No apparent deformity.      Strength:                RUE: 4+/5 SA, 5-/5 EF, 5-/5 EE, 5/5 WE, 5/5 FF, 5/5 FA                 LUE: 4-/5 SA, 5-/5 EF, 5-/5 EE, 5/5 WE, 5/5 FF, 5/5 FA                 RLE: 5-/5 HF, 5/5 KE, 5/5 DF, 5/5 EHL, 5/5 PF                 LLE:  5-/5 HF, 5/5 KE, 5/5 DF, 5/5 EHL, 5/5 PF   Neurologic exam:  Cognition: AAO to person, place, time and event.  Language: Fluent, No substitutions or neoglisms. No dysarthria. Names objects correctly.  Insight: Good insight into current condition.  Mood: Pleasant affect, appropriate mood.  Sensation: To light touch intact in BL UEs and LEs  Reflexes: 2+ in BL UE and LEs.  CN: 2-12 grossly intact.  Coordination: No apparent tremors. No ataxia on FTN. Spasticity: MAS 0 in all extremities.  Gait: Normal gait, stance and stride.  Balance: Able to walk on heels and toes. Unable to perform tandem gait. Unable to stand on one foot bilaterally >2 seconds.          Assessment & Plan:  ALLAN BACIGALUPI is a 56 y.o. female with history of DDD lumbar spine, asthma, autoimmune disease, Sjogren's  with recent onset of trigeminal neuralgia and was started on Tegretol, who presents as transition of care f/u after IPR admission 1/60-7/3 for metabolic encephalopathy d/t Tegretol rxn.  Toxic metabolic encephalopathy Assessment & Plan: D/t Tegretol drug reaction  SLP, PT, and OT therapy orders re-submitted  Doing extremely well otherwise from a rehab perspective. Will follow up around 10/13 to re-assess need for work restrictions/accomodations prior to return date 10/20.  Orders: -     Ambulatory referral to Physical Therapy -     Ambulatory referral to Occupational Therapy -     Ambulatory referral to Speech Therapy  Other post infection and related fatigue syndromes Assessment & Plan: Continue adaptive strategies to avoid over-exertion.  Can continue melatonin PRN for sleep - overall doing much better.   Follow up PRN if any worsening or concerning symptoms.     Gertie Gowda, DO 12/21/2021

## 2021-12-21 NOTE — Patient Instructions (Addendum)
If you need anything from our office, feel free to call or come in! If not, we will see you around 01/27/22 to discuss your progress in PT, OT, and SLP at [facility] and discuss any need for work restrictions/accommodations prior to return to work 02/03/22.

## 2021-12-26 ENCOUNTER — Encounter: Payer: BC Managed Care – PPO | Admitting: Physical Medicine and Rehabilitation

## 2021-12-27 ENCOUNTER — Encounter: Payer: BC Managed Care – PPO | Admitting: Physical Therapy

## 2022-01-02 ENCOUNTER — Encounter: Payer: Self-pay | Admitting: Physical Therapy

## 2022-01-02 ENCOUNTER — Ambulatory Visit: Payer: BC Managed Care – PPO | Admitting: Occupational Therapy

## 2022-01-02 ENCOUNTER — Ambulatory Visit: Payer: BC Managed Care – PPO | Attending: Physical Medicine and Rehabilitation | Admitting: Physical Therapy

## 2022-01-02 DIAGNOSIS — R41841 Cognitive communication deficit: Secondary | ICD-10-CM | POA: Diagnosis present

## 2022-01-02 DIAGNOSIS — M6281 Muscle weakness (generalized): Secondary | ICD-10-CM | POA: Insufficient documentation

## 2022-01-02 DIAGNOSIS — R278 Other lack of coordination: Secondary | ICD-10-CM | POA: Insufficient documentation

## 2022-01-02 DIAGNOSIS — G928 Other toxic encephalopathy: Secondary | ICD-10-CM | POA: Diagnosis not present

## 2022-01-02 DIAGNOSIS — R498 Other voice and resonance disorders: Secondary | ICD-10-CM | POA: Insufficient documentation

## 2022-01-02 DIAGNOSIS — R2681 Unsteadiness on feet: Secondary | ICD-10-CM | POA: Insufficient documentation

## 2022-01-02 NOTE — Therapy (Signed)
OUTPATIENT PHYSICAL THERAPY NEURO EVALUATION   Patient Name: Megan Reid MRN: [id number] DOB:[birth year], 56 y.o., female Today's Date: 01/02/2022   PCP: Leeroy Cha  REFERRING PROVIDER: Gertie Gowda, DO    PT End of Session - 01/02/22 0847     Visit Number 1    Date for PT Re-Evaluation 02/27/22    Authorization Type BCBS    PT Start Time 0803    PT Stop Time 0841    PT Time Calculation (min) 38 min    Activity Tolerance Patient tolerated treatment well    Behavior During Therapy [facility] for tasks assessed/performed             Past Medical History:  Diagnosis Date   Arthralgia 02/14/2016   Asthma    Autoimmune disease ([place]) 02/14/2016   Positive RF 139 CCP Negative  Positive ANA 1:640 Positive Ro Positive La Elevated ESR 105 - Sjorgen's Disease   DDD (degenerative disc disease), lumbar 02/14/2016   Fatigue 02/14/2016   Gastritis 02/14/2016   Hot flashes    Migraines 02/14/2016   Osteoarthritis of both hands 02/14/2016   Osteoarthritis of both knees 02/14/2016   Vitamin D deficiency 02/14/2016   Past Surgical History:  Procedure Laterality Date   BREAST BIOPSY Left    ROTATOR CUFF REPAIR Right 06/16/2019   TUBAL LIGATION     Patient Active Problem List   Diagnosis Date Noted   Anemia 12/20/2021   Abnormal LFTs 34/19/6222   Toxic metabolic encephalopathy 97/98/9211   Hepatitis 12/05/2021   Acute hepatitis 12/04/2021   Thrombocytopenia ([place]) 12/04/2021   Acute renal failure (HCC)    Seasonal and perennial allergic rhinitis 12/02/2020   Chronic coughing 12/02/2020   Sjogren's syndrome ([place]) 12/02/2020   Leukopenia 12/02/2020   Adverse reaction to food, subsequent encounter 06/23/2020   Heartburn 06/23/2020   Bruising 06/23/2020   Abrasion of right ankle 02/13/2017   Rheumatoid factor positive 09/01/2016   Leucopenia 09/01/2016   Autoimmune disease ([place]) 02/14/2016   Fatigue 02/14/2016   DDD (degenerative disc disease), lumbar 02/14/2016    Osteoarthritis of both knees 02/14/2016   Osteoarthritis of both hands 02/14/2016   Asthma 02/14/2016   Migraines 02/14/2016   Gastritis 02/14/2016   Vitamin D deficiency 02/14/2016   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 The last thing I remember is sleeping for 2 days straight, got up to go to the bathroom and wasn't eating/drinking for 2 days and fell in the bathtub I was really weak. My problem now physically is a little bit of balance issues and building strength. Had rotator cuff surgery and after all this my reach is really bad again.   Pt accompanied by: self  PERTINENT HISTORY: Brief HPI:   Megan Reid is a 56 y.o. female with history of DDD lumbar spine, asthma, autoimmune disease, Sjogren's with recent onset of trigeminal neuralgia and was started on Tegretol.  She was also reported to have been taking ibuprofen as well as Tylenol for pain management.  She was found to have metabolic encephalopathy with acute hepatic and renal failure.  Renal failure treated with IV fluids and bicarb .  AKI felt to be due to ATN from acute illness, diarrhea and ibuprofen use.  She developed fever past admission and was treated with IV antibiotics due to concerns of sepsis.  Work-up was negative for DR ESS,  tickborne illness, CMV, EBV and acute hepatitis/pancytopenia felt to be secondary to Tegretol toxicity.  She did not have pancytopenia and supportive care was recommended by heme-onc.  Renal status and abnormal LFTs were resolving however patient continued limited by weakness, DOE as well as encephalopathy with delayed processing.  CIR was recommended due to functional decline.     Hospital Course: Megan Reid was admitted to rehab 12/12/2021 for inpatient therapies to consist of PT, ST and OT at least three hours five days a week. Past admission physiatrist, therapy team and rehab RN have worked together to provide customized collaborative inpatient rehab.  Her blood pressures were monitored on TID basis and has been stable.  Follow-up check of electrolytes revealed renal status as well as abnormal LFTs to be gradually improving.  She was found to have drop in magnesium to 1.3 requiring IV supplementation as well as p.o. supplement.  Repeat check showed improvement with magnesium of 2.0.  Recommend repeat check in 1 to 2 weeks to monitor for stability as well as need to continue oral supplementation.   Follow-up CBC showed improvement in white count with stable H&H and that thrombocytopenia had resolved.  Her alertness and mentation has gradually improved with improvement in activity tolerance and respiratory status.  Hydroxyzine 10 mg 3 times daily as needed was added to help manage anxiety.  Sleep-wake disruption is improved with addition of melatonin.  Constipation has resolved with augmentation of bowel regimen.  She has made steady gains during her rehab stay and is modified independent at discharge.  Supervision is recommended with cognitive task and for safety.  She will continue to receive follow-up outpatient PT, OT and ST at [facility] outpatient rehab after discharge.     Rehab course: During patient's stay in rehab weekly team conferences were held to monitor patient's progress, set goals and  discuss barriers to discharge. At admission, patient required min assist with mobility and with ADL tasks.  She exhibited mild cognitive deficits affecting recall, memory and executive function with SLUMS score 22/30. She  has had improvement in activity tolerance, balance, postural control as well as ability to compensate for deficits. She is able to complete ADL tasks at modified independent level. She is independent for transfers and is able  to ambulate 150' without AD. She is able to climb 12 stairs independently. She requires supervision with functional and complex cognitive tasks. Her SLUMS score has improved to 26/30. Family education has been completed.    Disposition: Home  PAIN:  Are you having pain? No  PRECAUTIONS: Fall  WEIGHT BEARING RESTRICTIONS No  FALLS: Has patient fallen in last 6 months? Yes. Number of falls 1  LIVING ENVIRONMENT: Lives with: lives with their spouse Lives in: House/apartment Stairs: 4 STE B rails, home is handicap accessible except for bathtub  Has following equipment at home: None  PLOF: Independent and Independent with basic ADLs  PATIENT GOALS build strength and balance, endurance   OBJECTIVE:   DIAGNOSTIC FINDINGS:   COGNITION: Overall cognitive status: Impaired- memory issues after acute medical problem    SENSATION: Not tested  COORDINATION:   EDEMA:    MUSCLE TONE:    MUSCLE LENGTH:   DTRs:    POSTURE:   LOWER EXTREMITY ROM:     Active  Right Eval Left Eval  Hip flexion    Hip extension    Hip abduction    Hip adduction    Hip internal rotation    Hip external rotation    Knee flexion    Knee extension    Ankle dorsiflexion    Ankle plantarflexion    Ankle inversion    Ankle eversion     (Blank rows = not tested)  LOWER EXTREMITY MMT:    MMT Right Eval Left Eval  Hip flexion 3 3  Hip extension 2 2  Hip abduction 3 4-  Hip adduction    Hip internal rotation    Hip external rotation    Knee  flexion 4 4  Knee extension 4 4  Ankle dorsiflexion 3 3  Ankle plantarflexion    Ankle inversion    Ankle eversion    (Blank rows = not tested)  BED MOBILITY:  Sit to supine Complete Independence Supine to sit Complete Independence Rolling to Right Complete Independence Rolling to Left Complete Independence  TRANSFERS: Assistive device utilized: None  Sit to stand: Complete Independence Stand to sit: Complete Independence Chair to chair: Complete Independence Floor:   RAMP:   CURB:    STAIRS:    GAIT: Gait pattern: WFL and trendelenburg Distance walked: 67f  Assistive device utilized: None Level of assistance: Complete Independence Comments: WNL   FUNCTIONAL TESTs:  Berg Balance Scale: 42/56 2MWT 6763f  PATIENT SURVEYS:  Will take second session   TODAY'S TREATMENT:    Nustep L4 x6 minutes   Bridges x5  Sidelying hip ABD x5 B   PATIENT EDUCATION: Education details: exam findings, POC, HEP  Person educated: Patient Education method: ExConsulting civil engineerDemonstration, and Handouts Education comprehension: verbalized understanding and returned demonstration   HOME EXERCISE PROGRAM: [id number]    GOALS: Goals reviewed with patient? Yes  SHORT TERM GOALS: Target date: 01/30/2022  Will be compliant with appropriate progressive HEP  Baseline: Goal status: INITIAL  2.  Will be able to name at least 3 ways to reduce fall risk at home and in community  Baseline:  Goal status: INITIAL  3.  Will tolerate standing tasks for at least 90 minutes without increase in fatigue  Baseline:  Goal status: INITIAL  4.  Will be able to walk for at least 15 minutes without rest to show improved functional activity tolerance  Baseline:  Goal status: INITIAL    LONG TERM GOALS: Target date: 02/27/2022  MMT  to improve by at least 1 grade in all weak groups  Baseline:  Goal status: INITIAL  2.  Will score at least 50 on the Berg to show improved functional  balance  Baseline:  Goal status: INITIAL  3.  Will tolerate at least 1/2 day at work without fatigue to show improved functional activity tolerance  Baseline:  Goal status: INITIAL  4.  Will be independent with appropriate gym based exercise program to maintain functional gains and continue progress on an independent basis  Baseline:  Goal status: INITIAL   ASSESSMENT:  CLINICAL IMPRESSION: Patient is a 56 y.o. F who was seen today for physical therapy evaluation and treatment for care s/p hospitalization for toxic metabolic encephalopathy. Exam reveals significant functional mm weakness, impaired functional balance, and poor functional activity tolerance. Will benefit from skilled PT services to return to PLOF.    OBJECTIVE IMPAIRMENTS cardiopulmonary status limiting activity, decreased activity tolerance, decreased balance, decreased cognition, decreased mobility, difficulty walking, decreased strength, and improper body mechanics.   ACTIVITY LIMITATIONS standing, squatting, stairs, transfers, bed mobility, locomotion level, and caring for others  PARTICIPATION LIMITATIONS: community activity, occupation, and yard work  PERSONAL FACTORS Age, Fitness, Past/current experiences, and Time since onset of injury/illness/exacerbation are also affecting patient's functional outcome.   REHAB POTENTIAL: Good  CLINICAL DECISION MAKING: Evolving/moderate complexity  EVALUATION COMPLEXITY: Moderate  PLAN: PT FREQUENCY: 2x/week  PT DURATION: 8 weeks  PLANNED INTERVENTIONS: Therapeutic exercises, Therapeutic activity, Neuromuscular re-education, Balance training, Gait training, Patient/Family education, Self Care, Joint mobilization, Stair training, Dry Needling, Cognitive remediation, Cryotherapy, Moist heat, Taping, Ultrasound, Ionotophoresis 32m/ml Dexamethasone, Manual therapy, and Re-evaluation  PLAN FOR NEXT SESSION: focus on functional activity tolerance, balance, strength      U PT DPT PN2  01/02/2022, 8:50 AM

## 2022-01-02 NOTE — Therapy (Unsigned)
OUTPATIENT OCCUPATIONAL THERAPY NEURO EVALUATION  Patient Name: Megan Reid MRN: [id number] DOB:July 15, 1965, 56 y.o., female Today's Date: 01/02/2022  PCP: Leeroy Cha, MD REFERRING PROVIDER: Gertie Gowda, DO   OT End of Session - 01/02/22 0845     Visit Number 1    Authorization Type BCBS    Authorization Time Period VL: 47    OT Start Time 0843    OT Stop Time 0923    OT Time Calculation (min) 40 min    Behavior During Therapy [facility] for tasks assessed/performed            Past Medical History:  Diagnosis Date   Arthralgia 02/14/2016   Asthma    Autoimmune disease ([place]) 02/14/2016   Positive RF 139 CCP Negative  Positive ANA 1:640 Positive Ro Positive La Elevated 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 - Sjorgen's Disease   DDD (degenerative disc disease), lumbar 02/14/2016   Fatigue 02/14/2016   Gastritis 02/14/2016   Hot flashes    Migraines 02/14/2016   Osteoarthritis of both hands 02/14/2016   Osteoarthritis of both knees 02/14/2016   Vitamin D deficiency 02/14/2016   Past Surgical History:  Procedure Laterality Date   BREAST BIOPSY Left    ROTATOR CUFF REPAIR Right 06/16/2019   TUBAL LIGATION     Patient Active Problem List   Diagnosis Date Noted   Anemia 12/20/2021   Abnormal LFTs 47/65/4650   Toxic metabolic encephalopathy 35/46/5681   Hepatitis 12/05/2021   Acute hepatitis 12/04/2021   Thrombocytopenia ([place]) 12/04/2021   Acute renal failure (HCC)    Seasonal and perennial allergic rhinitis 12/02/2020   Chronic coughing 12/02/2020   Sjogren's syndrome ([place]) 12/02/2020   Leukopenia 12/02/2020   Adverse reaction to food, subsequent encounter 06/23/2020   Heartburn 06/23/2020   Bruising 06/23/2020   Abrasion of right ankle 02/13/2017   Rheumatoid factor positive 09/01/2016   Leucopenia 09/01/2016   Autoimmune disease ([place]) 02/14/2016   Fatigue 02/14/2016   DDD (degenerative disc disease), lumbar 02/14/2016   Osteoarthritis of both knees 02/14/2016    Osteoarthritis of both hands 02/14/2016   Asthma 02/14/2016   Migraines 02/14/2016   Gastritis 02/14/2016   Vitamin D deficiency 02/14/2016   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  Rationale for Evaluation and Treatment Rehabilitation  SUBJECTIVE:   SUBJECTIVE STATEMENT: Pt arrives to OP OT evaluation w/ primary concern related  Pt accompanied by: self  PERTINENT HISTORY: Megan Reid is a 55 y.o. female with history of DDD lumbar spine, asthma, autoimmune disease, Sjogren's with recent onset of trigeminal neuralgia and was started on Tegretol. She was also reported to have been taking ibuprofen as well as Tylenol for pain management. She was found to have metabolic encephalopathy with acute hepatic and renal failure. Renal failure treated with IV fluids and bicarb. AKI felt to be due to ATN from acute illness, diarrhea and ibuprofen use. She developed fever past admission and was treated with IV antibiotics due to concerns of sepsis. Work-up was negative for DR ESS, tickborne illness, CMV, EBV and acute hepatitis/pancytopenia felt to be secondary to Tegretol toxicity. She did not have pancytopenia and supportive care was recommended by heme-onc. Renal status and abnormal LFTs were resolving however patient continued limited by weakness, DOE as well as encephalopathy with delayed processing. CIR was recommended due  to functional decline.  PRECAUTIONS: Fall; no lifting more than 15 lbs, care w/ strenuous activity  WEIGHT BEARING RESTRICTIONS No  PAIN: Are you having pain? No  FALLS: Has patient fallen in last 6 months?  Only fall at onset prior to hospital admission  LIVING ENVIRONMENT: Lives with: lives with their family (husband and grandson) Lives in:  House/apartment Stairs: No Has following equipment at home: None  PLOF: Independent, Vocation/Vocational requirements: high Education officer, museum (full-time), and Leisure: running  PATIENT GOALS: Get back to running; return to "everyday living;" improve reaching w/ RUE  OBJECTIVE:   HAND DOMINANCE: Right  ADLs: Overall ADLs: Pt reports able to complete BADLs w/ at least modified independence Grooming: Requires compensatory strategies for decreased RUE strength during hair care Bathing: Reports significant fatigue w/ showering; uses shower seat prn and when drying off Equipment: Shower seat with back, Grab bars, and tub/shower combo  IADLs: Light housekeeping: Mod Ind Meal Prep: Mod Ind Community mobility: Driving independently Medication management: Independent w/ pillbox Handwriting:  Pt reports feeling like her handwriting has become "sloppy"  MOBILITY STATUS:  Ambulated in/out of session w/out difficulty  ACTIVITY TOLERANCE: Decreased activity tolerance, per pt report, but it depends on the activity  UPPER EXTREMITY ROM     Active ROM Right eval  Shoulder flexion 69  Shoulder abduction 72  Shoulder adduction   Shoulder extension   Shoulder internal rotation   Shoulder external rotation 56  Elbow flexion WFL  Elbow extension   (Blank rows = not tested)  HAND FUNCTION: Not assessed due to medical restrictions  COORDINATION: 9 Hole Peg test: Right: 26.18 sec; Left: 25.49 sec  SENSATION: 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  COGNITION: Overall cognitive status: Within functional limits for tasks assessed Trail Making: Part B: 39.33 sec  VISION: Subjective report: "Sometimes I feel like I cannot see;"  Baseline vision: Wears glasses all the time Visual history:  no significant visual history  VISION ASSESSMENT: WFL  Patient has difficulty with following activities due to following visual impairments: watching TV  PERCEPTION: WFL  OBSERVATIONS:  ***   TODAY'S TREATMENT:  To be administered   PATIENT EDUCATION: Educated on role and purpose of OT as well as potential interventions and goals for therapy based on initial evaluation findings. Person educated: Patient Education method: Explanation Education comprehension: verbalized understanding   HOME EXERCISE PROGRAM: To be administered   GOALS: Goals reviewed with patient? {yes/no:20286}  SHORT TERM GOALS: Target date: ***    Status:  1 *** Baseline: *** {Progress Towards Goals:21014066}  2 *** Baseline: *** {Progress Towards Goals:21014066}  3 *** Baseline: *** {Progress Towards Goals:21014066}  4 *** Baseline: *** {Progress Towards Goals:21014066}  5 *** Baseline: *** {Progress Towards Goals:21014066}     LONG TERM GOALS: Target date: ***    Status:  1 *** Baseline: *** {Progress Towards Goals:21014066}  2 *** Baseline: *** {Progress Towards Goals:21014066}  3 *** Baseline: *** {Progress Towards Goals:21014066}  4 *** Baseline: *** {Progress Towards Goals:21014066}  5 *** Baseline: *** {Progress Towards Goals:21014066}     ASSESSMENT:  CLINICAL IMPRESSION: Pt is a *** y/o who presents to OP OT due to ***. PMH includes ***. Pt currently lives with *** in a *** and is retired/on disability/works/was working as a *** prior to onset. Pt will benefit from skilled occupational therapy services to address strength and coordination, ROM, pain management, altered sensation, balance, GM/FM control, cognition, visual perception, safety awareness, introduction of compensatory strategies/AE prn, and implementation of an HEP  to improve participation and safety during ADLs and ***.   PERFORMANCE DEFICITS in functional skills including {OT physical skills:25468}, cognitive skills including {OT cognitive skills:25469}, and psychosocial skills including {OT psychosocial skills:25470}.   IMPAIRMENTS are limiting patient from {OT performance deficits:25471}.    COMORBIDITIES {Comorbidities:25485} that affects occupational performance. Patient will benefit from skilled OT to address above impairments and improve overall function.  MODIFICATION OR ASSISTANCE TO COMPLETE EVALUATION: {OT modification:25474}  OT OCCUPATIONAL PROFILE AND HISTORY: {OT PROFILE AND HISTORY:25484}  CLINICAL DECISION MAKING: {OT CDM:25475}  REHAB POTENTIAL: {rehabpotential:25112}  EVALUATION COMPLEXITY: {Evaluation complexity:25115}    PLAN: OT FREQUENCY: {rehab frequency:25116}  OT DURATION: {rehab duration:25117}  PLANNED INTERVENTIONS: {OT Interventions:25467}  RECOMMENDED OTHER SERVICES: ***  CONSULTED AND AGREED WITH PLAN OF CARE: {BVA:70141}  PLAN FOR NEXT SESSION: ***   Kathrine Cords, MSOT, OTR/L 01/02/2022, 8:46 AM

## 2022-01-04 ENCOUNTER — Encounter: Payer: Self-pay | Admitting: Speech Pathology

## 2022-01-04 ENCOUNTER — Ambulatory Visit: Payer: BC Managed Care – PPO | Admitting: Speech Pathology

## 2022-01-04 DIAGNOSIS — M6281 Muscle weakness (generalized): Secondary | ICD-10-CM | POA: Diagnosis not present

## 2022-01-04 DIAGNOSIS — R41841 Cognitive communication deficit: Secondary | ICD-10-CM

## 2022-01-04 NOTE — Patient Instructions (Signed)
   Get the persons attention before you speak  Use eye contact and face the person you are speaking to  Be in close proximity to the person you are speaking to  Turn down any noise in the environment such as the TV, walk away from loud appliances, air conditioners, fans, dish washers etc  Repeat back what you have heard  Ask for information in writing (email, texts etc)  Write down important information  Have specific times for checking emails, texts, voice mails  Try to defer duty until after Christmas (2nd Semester duty only if possible) and switch duty to [place] a quiet space to rest and re-set your brain in between classes - let supervisor know you will need a few 15 minute breaks at first in quiet space - this won't be forever - 1st semester  Let parents know you will get back with them in 48 hours to give yourself a buffer  After work you may have more fatigue - don't plan on going out in the evenings or doing too much on the weekends at first  In large gatherings, sit or stay on the side not the center of the room  Try to sit with a wall behind you or in a corner so noise isn't coming at you from all directions when dining out or attending gatherings     Tips to help facilitate better attention, concentration, focus   Do harder, longer tasks when you are most alert/awake  Break down larger tasks into small parts  Limit distractions of TV, radio, conversation, e mails/texts, appliance noise, etc - if a job is important, do it in a quiet room  Be aware of how you are functioning in high stimulation environments such as large stores, parties, restaurants - any place with lots of lights, noise, signs etc  Group conversations may be more difficult to process than one on one conversations  Give yourself extra time to process conversation, reading materials, directions or information from your healthcare providers  Organization is key - clutters of laundry, mail,  paperwork, dirty dishes - all make it more difficult to concentrate  Before you start a task, have all the needed supplies, directions, recipes ready and organized. This way you don't have to go looking for something in the middle of a task and become distracted.   Be aware of fatigue - take rests or breaks when needed to re-group and re-focus

## 2022-01-04 NOTE — Therapy (Signed)
OUTPATIENT SPEECH LANGUAGE PATHOLOGY EVALUATION   Patient Name: Megan Reid MRN: [id number] DOB:[birth year], 56 y.o., female Today's Date: 01/04/2022  PCP: Leeroy Cha MD REFERRING PROVIDER: Gertie Gowda, DO    End of Session - 01/04/22 1350     Visit Number 1    Number of Visits 17    Date for SLP Re-Evaluation 03/01/22    SLP Start Time 76    SLP Stop Time  1315    SLP Time Calculation (min) 45 min    Activity Tolerance Patient tolerated treatment well             Past Medical History:  Diagnosis Date   Arthralgia 02/14/2016   Asthma    Autoimmune disease ([place]) 02/14/2016   Positive RF 139 CCP Negative  Positive ANA 1:640 Positive Ro Positive La Elevated ESR 105 - Sjorgen's Disease   DDD (degenerative disc disease), lumbar 02/14/2016   Fatigue 02/14/2016   Gastritis 02/14/2016   Hot flashes    Migraines 02/14/2016   Osteoarthritis of both hands 02/14/2016   Osteoarthritis of both knees 02/14/2016   Vitamin D deficiency 02/14/2016   Past Surgical History:  Procedure Laterality Date   BREAST BIOPSY Left    ROTATOR CUFF REPAIR Right 06/16/2019   TUBAL LIGATION     Patient Active Problem List   Diagnosis Date Noted   Anemia 12/20/2021   Abnormal LFTs 48/88/9169   Toxic metabolic encephalopathy 45/06/8880   Hepatitis 12/05/2021   Acute hepatitis 12/04/2021   Thrombocytopenia ([place]) 12/04/2021   Acute renal failure ([facility])    Seasonal and perennial allergic rhinitis 12/02/2020   Chronic coughing 12/02/2020   Sjogren's syndrome ([place]) 12/02/2020   Leukopenia 12/02/2020   Adverse reaction to food, subsequent encounter 06/23/2020   Heartburn 06/23/2020   Bruising 06/23/2020   Abrasion of right ankle 02/13/2017   Rheumatoid factor positive 09/01/2016   Leucopenia 09/01/2016   Autoimmune disease ([place]) 02/14/2016   Fatigue 02/14/2016   DDD (degenerative disc disease), lumbar 02/14/2016   Osteoarthritis of both knees 02/14/2016    Osteoarthritis of both hands 02/14/2016   Asthma 02/14/2016   Migraines 02/14/2016   Gastritis 02/14/2016   Vitamin D deficiency 02/14/2016   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[place]) 02/14/2016   High risk medication use 02/14/2016    ONSET DATE: December 04, 2021   REFERRING DIAG: G92.8 ([id number]) - Toxic metabolic encephalopathy   THERAPY DIAG:  Cognitive communication deficit  Rationale for Evaluation and Treatment Rehabilitation  SUBJECTIVE:   SUBJECTIVE STATEMENT: "I have to write everything down - I can't remember things" Pt accompanied by: self  PERTINENT HISTORY: Megan Reid is a 56 y.o. female with history of DDD lumbar spine, asthma, autoimmune disease, Sjogren's with recent onset of trigeminal neuralgia and was started on Tegretol.  She was also reported to have been taking ibuprofen as well as Tylenol for pain management.  She was found to have metabolic encephalopathy with acute hepatic and renal failure.  Renal failure treated with IV fluids and bicarb .  AKI felt to be due to ATN from acute illness, diarrhea and ibuprofen use.  She developed fever past admission and was treated with IV antibiotics due to concerns of sepsis.  Work-up was negative for DR ESS, tickborne illness, CMV, EBV and acute hepatitis/pancytopenia felt to be secondary to Tegretol toxicity.  She did not have pancytopenia and supportive care was recommended by heme-onc.  Renal status and abnormal LFTs were resolving however patient continued limited by weakness,  DOE as well as encephalopathy with delayed processing.  CIR was recommended due to functional decline.      PAIN:  Are you having pain? No   FALLS: Has patient fallen in last 6 months?  See PT evaluation for details  LIVING ENVIRONMENT: Lives with: lives with their spouse Lives in: House/apartment  PLOF:  Level of assistance: Independent with ADLs, Independent with IADLs Employment: Full-time employment   PATIENT  GOALS Return to work  OBJECTIVE:   DIAGNOSTIC FINDINGS:   COGNITION: Overall cognitive status: Impaired Areas of impairment:  Attention: Impaired: Alternating, Divided Memory: Impaired: Short term Auditory   COGNITIVE COMMUNICATION Following directions: Follows multi-step commands consistently  Auditory comprehension: WFL Verbal expression: Impaired: Megan Reid reports word finding difficulties in conversation, likely due to cognitive impairments Functional communication: WFL  ORAL MOTOR EXAMINATION Overall status: WFL Comments:   STANDARDIZED ASSESSMENTS: CLQT: Attention: WNL, Memory: WNL, Executive Function: WNL, Language: WNL, Visuospatial Skills: WNL, and Clock Drawing: WNL   PATIENT REPORTED OUTCOME MEASURES (PROM): Cognitive Function: 66/140 - Megan Reid rated a 2 or "a lot of difficulty/occurs often for: reading and following complex directions, getting organized, loosing items, recalling 4-5 errands, recalling names, having to work hard to pay attention, . She rated a 1, or very often/several times a day having difficulty having to read something several times to understand, keeping track of task when interrupted, multi tasking, recalling if she did what she was supposed to, waking into a room and forgetting why, trouble thinking clearly, word being on the tip of her tongue   TODAY'S TREATMENT:  Initiated training in compensations for attention and memory - handout provided   PATIENT EDUCATION: Education details: compensations for attention and memory, accommodations for return to work Person educated: Patient Education method: Consulting civil engineer, Verbal cues, and Handouts Education comprehension: verbal cues required and needs further education     GOALS: Goals reviewed with patient? Yes  SHORT TERM GOALS: Target date: 02/01/2022  Pt will use compensations for attention and memory to report 50% reduction in loosing household items (subjectively) Baseline: Goal status:  INITIAL  2.  Pt will carryover 3 strategies to recall 4 details from paragraph over 2 sessions Baseline:  Goal status: INITIAL  3.  Pt will carryover 2 strategies to recall pertinent information from work, insurance, healthcare providers over 1 week Baseline:  Goal status: INITIAL  4.  Pt will verbalize 3 accommodations she can request upon return to work Baseline:  Goal status: INITIAL    LONG TERM GOALS: Target date:03/01/2022  Pt will carryover 4 strategies for attention and memory to recall pertinent details from 2 paragraphs and from phone calls/conversations over 2 sessions Baseline:  Goal status: INITIAL  2.  Pt will verbalize 4 accommodations she can use upon return to work with rare min A Baseline:  Goal status: INITIAL  3.  Pt will improve score on cognitive function PROM -  Baseline: 67 Goal status: INITIAL  ASSESSMENT:  CLINICAL IMPRESSION: Patient is a 56 y.o. female who was seen today for impairments in memory and attention. Today she presents with mild high level cognitive linguistic impairment. Megan Reid reports she has difficulty concentrating, forgets why she went into a room, not comprehending/recalling written information, and difficulty mulit taksing. On CLQT, she recalled 15/18 details on Story Retelling, and answered 5/6 yes/no questions re: story accurately. This is a change for her as she would have recalled all details prior to hospitalization. 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 She and her spouse are raining her 71 year old grandson. Megan Reid is not responsible for managing family finances. I recommend skilled ST to maximize cognition and train in compensatory strategies for cognitive communication impairments to maximize successful return to work.  OBJECTIVE IMPAIRMENTS include attention, memory, and expressive language. These impairments are limiting patient from return to work and  effectively communicating at home and in community. Factors affecting potential to achieve goals and functional outcome are none.. Patient will benefit from skilled SLP services to address above impairments and improve overall function.  REHAB POTENTIAL: Good  PLAN: SLP FREQUENCY: 2x/week  SLP DURATION: 8 weeks  PLANNED INTERVENTIONS: Language facilitation, Environmental controls, Cueing hierachy, Cognitive reorganization, and Internal/external aids    Megan Reid, Annye Rusk, CCC-SLP 01/04/2022, 1:53 PM

## 2022-01-05 ENCOUNTER — Ambulatory Visit: Payer: BC Managed Care – PPO | Admitting: Speech Pathology

## 2022-01-05 ENCOUNTER — Encounter: Payer: Self-pay | Admitting: Speech Pathology

## 2022-01-05 DIAGNOSIS — R41841 Cognitive communication deficit: Secondary | ICD-10-CM

## 2022-01-05 DIAGNOSIS — M6281 Muscle weakness (generalized): Secondary | ICD-10-CM | POA: Diagnosis not present

## 2022-01-05 NOTE — Patient Instructions (Addendum)
   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 An audible "sh"  may help   Think of your belly as a balloon, when you fill with air (inhale), the balloon gets bigger. As the air goes out (exhale), the balloon deflates.  If you are having difficulty coordinating this, lay on your back with a plastic cup on your belly and repeat the above steps, watching you belly move up with inhalation and down with exhalations  Practice breathing in and out in front of a mirror, watching your belly Breathe in for a count of 5 and breathe out for a count of 5  Now as you breathe out, get a picture of relaxing in your mind Feel the constant in-out of your breathing with your belly Picture the tension in your throat and chest evaporate like steam, melting away and FEEL it do so Picture your throat opening up so wide that a grapefruit or softball could fit through your throat.   Practice this throughout the day when you are not having symptoms. For example: in the car, when watching TV, before medications. Regular practice when you are feeling well is important.  Make it  automatic and use it at the first sense of throat tightness to prevent or suppress the VCD. You may start with the inhale or exhale.   Be patient when completing the breathing. It may take several minutes to start feeling relief  Use the breathing to "pre-treat" yourself before a known trigger for VCD. Possible triggers could be: change in air temperature, strong odors, perfume, and exercise.  When you feel the "tickle" in your throat, start the belly sniff and blow  Dr. Brand Males is a pulmonologist who specializes in VCD, irritable larynx, chronic cough   There's an App for that: Breathe2relax  Provided by: Georgiann Hahn. SLP [phone]

## 2022-01-05 NOTE — Therapy (Signed)
OUTPATIENT SPEECH LANGUAGE PATHOLOGY TREATMENT NOTE   Patient Name: Megan Reid MRN: [id number] DOB:06-08-1965, 56 y.o., female Today's Date: 01/05/2022  PCP: Aleda Grana MD REFERRING PROVIDER: Gertie Gowda, DO   END OF SESSION:   End of Session - 01/05/22 1437     Visit Number 3    Number of Visits 17    Date for SLP Re-Evaluation 03/01/22    SLP Start Time 71    SLP Stop Time  1315    SLP Time Calculation (min) 45 min    Activity Tolerance Patient tolerated treatment well             Past Medical History:  Diagnosis Date   Arthralgia 02/14/2016   Asthma    Autoimmune disease ([place]) 02/14/2016   Positive RF 139 CCP Negative  Positive ANA 1:640 Positive Ro Positive La Elevated ESR 105 - Sjorgen's Disease   DDD (degenerative disc disease), lumbar 02/14/2016   Fatigue 02/14/2016   Gastritis 02/14/2016   Hot flashes    Migraines 02/14/2016   Osteoarthritis of both hands 02/14/2016   Osteoarthritis of both knees 02/14/2016   Vitamin D deficiency 02/14/2016   Past Surgical History:  Procedure Laterality Date   BREAST BIOPSY Left    ROTATOR CUFF REPAIR Right 06/16/2019   TUBAL LIGATION     Patient Active Problem List   Diagnosis Date Noted   Anemia 12/20/2021   Abnormal LFTs 16/04/930   Toxic metabolic encephalopathy 35/57/3220   Hepatitis 12/05/2021   Acute hepatitis 12/04/2021   Thrombocytopenia ([place]) 12/04/2021   Acute renal failure (HCC)    Seasonal and perennial allergic rhinitis 12/02/2020   Chronic coughing 12/02/2020   Sjogren's syndrome ([place]) 12/02/2020   Leukopenia 12/02/2020   Adverse reaction to food, subsequent encounter 06/23/2020   Heartburn 06/23/2020   Bruising 06/23/2020   Abrasion of right ankle 02/13/2017   Rheumatoid factor positive 09/01/2016   Leucopenia 09/01/2016   Autoimmune disease ([place]) 02/14/2016   Fatigue 02/14/2016   DDD (degenerative disc disease), lumbar 02/14/2016   Osteoarthritis of both knees  02/14/2016   Osteoarthritis of both hands 02/14/2016   Asthma 02/14/2016   Migraines 02/14/2016   Gastritis 02/14/2016   Vitamin D deficiency 02/14/2016   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[place]) 02/14/2016   High risk medication use 02/14/2016    ONSET DATE: 12/04/2021  REFERRING DIAG: G92.8 ([id number]) - Toxic metabolic encephalopathy C37.6 (EGB-15-VV) - Toxic metabolic encephalopathy   THERAPY DIAG:  Cognitive communication deficit  Rationale for Evaluation and Treatment Rehabilitation  SUBJECTIVE: "My daughter knew the TV was on when she called because I wasn't responding to her"  PAIN:  Are you having pain? No   OBJECTIVE:   TODAY'S TREATMENT: 9/21/223: Initiated training in compensatory strategies for attention, including external strategies of reducing back ground noise, getting important information in writing, repeating back what she has heard. Nuria generated strategy of taking notes during staff meetings then comparing her notes to the secretary's to ensure she has all of the information she needs. 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 Gwendola reports she has began to take notes on important conversations and information from HR, MD, insurance - I instructed  her to read them back to the other person to ensure she has all details correct. During session, Abigail had coughing attack triggered by a smell. She reports this has been going on for 3 years, she is on reflux meds. 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  Briefly demonstrated breathing techniques for VCD and provided name of Dr. [place] Caller should she want to pursue work up for this.   PATIENT EDUCATION: Education details: compensations for attention and memory, accommodations for return to work, see Patient Instructions Person educated: Patient Education method: Explanation, Verbal cues, and Handouts Education comprehension: verbal cues required and needs further education         GOALS: Goals reviewed with patient? Yes   SHORT TERM GOALS: Target date: 02/01/2022   Pt will use compensations for attention and memory to report 50% reduction in loosing household items (subjectively) Baseline: Goal status: ONGOING   2.  Pt will carryover 3 strategies to recall 4 details from paragraph over 2 sessions Baseline:  Goal status: ONGOING   3.  Pt will carryover 2 strategies to recall pertinent information from work, insurance, healthcare providers over 1 week Baseline:  Goal status: ONGOING   4.  Pt will verbalize 3 accommodations she can request upon return to work Baseline:  Goal status: ONGOING       LONG TERM GOALS: Target date:03/01/2022   Pt will carryover 4 strategies for attention and memory to recall pertinent details from 2 paragraphs and from phone calls/conversations over 2 sessions Baseline:  Goal status: ONGOING   2.  Pt will verbalize 4 accommodations she can use upon return to work with rare min A Baseline:  Goal status: ONGOING   3.  Pt will improve score on cognitive function PROM -  Baseline: 67 Goal status: ONGOING   ASSESSMENT:   CLINICAL IMPRESSION: Patient is a 56 y.o. female who was seen today for impairments in memory and attention. she presents with mild high level cognitive linguistic impairment. Ongoing training for compensatory strategies for attention/processing, memory. 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 She also has s/s of VCD/Inspiratory Laryngeal Obstruction. I recommend skilled ST to maximize cognition and train in compensatory strategies for cognitive communication impairments to maximize successful return to work.   OBJECTIVE IMPAIRMENTS include attention, memory, and expressive language. These impairments are limiting patient from return to work and effectively communicating at home and in community. Factors affecting potential to achieve goals and functional outcome are none.. Patient will benefit from skilled SLP services to address above impairments and improve overall function.   REHAB POTENTIAL: Good   PLAN: SLP FREQUENCY: 2x/week   SLP DURATION: 8 weeks   PLANNED INTERVENTIONS: Language facilitation, Environmental controls, Cueing hierachy, Cognitive reorganization, and Internal/external aids         Kylen Ismael, Annye Rusk, CCC-SLP 01/05/2022, 2:38 PM

## 2022-01-10 ENCOUNTER — Ambulatory Visit: Payer: BC Managed Care – PPO | Admitting: Speech Pathology

## 2022-01-10 ENCOUNTER — Encounter: Payer: Self-pay | Admitting: Speech Pathology

## 2022-01-10 DIAGNOSIS — R41841 Cognitive communication deficit: Secondary | ICD-10-CM

## 2022-01-10 DIAGNOSIS — M6281 Muscle weakness (generalized): Secondary | ICD-10-CM | POA: Diagnosis not present

## 2022-01-10 NOTE — Patient Instructions (Signed)
    Semi-occluded vocal tract exercises (SOVTE)  These allow your vocal folds to vibrate without excess tension and promotes high placement of the voice  Use SOVTE as a warm up before prolonged speaking and vocal exercises   High resistance: voicing through a stirring straw  Medium resistance: voicing through a drinking straw  Less resistance: Voiced /v/                            Lip or Tongue Trill                            Nasal "hums" /m/ and /n/                            Watch Vocal Straw Exercises with Lolita Cram on YouTube:  [email or web address]  Pitch Glides for 2 minutes  Accents (siren)  Hum the [employer]  A goal would be 2-3 minutes several times a day and prior to vocal exercises  As always, use good belly breathing while completing SOVTE  Breathe whenever you have to  Practice listening to [employer] Talk or Short Pod Cast with some low music or background noise   Practice multitasking when it's not important - read an article when hubs is watching TV  Get some noise cancelling headphones  Biotene gel   Be aware of how loud the classroom is that you are trying to talk over - loud AC, music, appliances, band, bus   Try to rest your voice as much as possible - limit long phone calls

## 2022-01-10 NOTE — Therapy (Signed)
OUTPATIENT SPEECH LANGUAGE PATHOLOGY TREATMENT NOTE   Patient Name: Megan Reid MRN: [id number] DOB:1966/04/11, 56 y.o., female Today's Date: 01/10/2022  PCP: Aleda Grana MD REFERRING PROVIDER: Gertie Gowda, DO   END OF SESSION:   End of Session - 01/10/22 1230     Visit Number 4    Number of Visits 17    Date for SLP Re-Evaluation 03/01/22    SLP Start Time 1230    SLP Stop Time  1315    SLP Time Calculation (min) 45 min    Activity Tolerance Patient tolerated treatment well             Past Medical History:  Diagnosis Date   Arthralgia 02/14/2016   Asthma    Autoimmune disease ([place]) 02/14/2016   Positive RF 139 CCP Negative  Positive ANA 1:640 Positive Ro Positive La Elevated ESR 105 - Sjorgen's Disease   DDD (degenerative disc disease), lumbar 02/14/2016   Fatigue 02/14/2016   Gastritis 02/14/2016   Hot flashes    Migraines 02/14/2016   Osteoarthritis of both hands 02/14/2016   Osteoarthritis of both knees 02/14/2016   Vitamin D deficiency 02/14/2016   Past Surgical History:  Procedure Laterality Date   BREAST BIOPSY Left    ROTATOR CUFF REPAIR Right 06/16/2019   TUBAL LIGATION     Patient Active Problem List   Diagnosis Date Noted   Anemia 12/20/2021   Abnormal LFTs 41/32/4401   Toxic metabolic encephalopathy 02/72/5366   Hepatitis 12/05/2021   Acute hepatitis 12/04/2021   Thrombocytopenia ([place]) 12/04/2021   Acute renal failure (HCC)    Seasonal and perennial allergic rhinitis 12/02/2020   Chronic coughing 12/02/2020   Sjogren's syndrome ([place]) 12/02/2020   Leukopenia 12/02/2020   Adverse reaction to food, subsequent encounter 06/23/2020   Heartburn 06/23/2020   Bruising 06/23/2020   Abrasion of right ankle 02/13/2017   Rheumatoid factor positive 09/01/2016   Leucopenia 09/01/2016   Autoimmune disease ([place]) 02/14/2016   Fatigue 02/14/2016   DDD (degenerative disc disease), lumbar 02/14/2016   Osteoarthritis of both knees  02/14/2016   Osteoarthritis of both hands 02/14/2016   Asthma 02/14/2016   Migraines 02/14/2016   Gastritis 02/14/2016   Vitamin D deficiency 02/14/2016   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[place]) 02/14/2016   High risk medication use 02/14/2016    ONSET DATE: 12/04/2021  REFERRING DIAG: G92.8 (YQI-34-VQ) - Toxic metabolic encephalopathy Q59.5 (GLO-75-IE) - Toxic metabolic encephalopathy   THERAPY DIAG:  Cognitive communication deficit  Rationale for Evaluation and Treatment Rehabilitation  SUBJECTIVE: "I had a chance to do the breathing on the plane, I was able to help control it"  PAIN:  Are you having pain? No   OBJECTIVE:   TODAY'S TREATMENT:  01/10/22: Elenora reports success attending to and responding to 2 conversations at once, her family noted the success. She reports she successfully used abdominal sniff-blow to control a coughing episode on an airplane this past week. She continues to practice VCD breathing tx. We generated 2 activities to do at home to target multi tasking for return to classroom, including listening to podcasts with background noises and "working" on practice task on computer with TV. We generated strategy of using visuals to keep classroom noise to a minimum. Due to mildly hoarse voice, trained Yazmina in throat clear alternatives and Semi-occluded vocal tract exercises to warm up before teaching and throughout the day. Marlie verbalized 3 strategies to compensation for reduced attention in the classroom with rare min A.  9/21/223: Initiated training in compensatory strategies for attention, including external strategies of reducing back ground noise, getting important information in writing, repeating back what she has heard. Eilee generated strategy of taking notes during staff meetings then comparing her notes to the secretary's to ensure she has all of the information she needs. We collaborated to ID some strategies she can use in  her classroom to reduce the need to multi task, including checking voice mails, emails and texts at set times rather than throughout the day, self advocating to administration that she may need 10-15 minute breaks during her planning periods, and switching her after school duty to next semester if able. Naziya reports she has began to take notes on important conversations and information from HR, MD, insurance - I instructed her to read them back to the other person to ensure she has all details correct. During session, Bobbe had coughing attack triggered by a smell. She reports this has been going on for 3 years, she is on reflux meds. 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  Briefly demonstrated breathing techniques for VCD and provided name of Dr. Chase Caller should she want to pursue work up for this.   PATIENT EDUCATION: Education details: compensations for attention and memory, accommodations for return to work, see Patient Instructions Person educated: Patient Education method: Explanation, Verbal cues, and Handouts Education comprehension: verbal cues required and needs further education         GOALS: Goals reviewed with patient? Yes   SHORT TERM GOALS: Target date: 02/01/2022   Pt will use compensations for attention and memory to report 50% reduction in loosing household items (subjectively) Baseline: Goal status: ONGOING   2.  Pt will carryover 3 strategies to recall 4 details from paragraph over 2 sessions Baseline:  Goal status: ONGOING   3.  Pt will carryover 2 strategies to recall pertinent information from work, insurance, healthcare providers over 1 week Baseline:  Goal status: ONGOING   4.  Pt will verbalize 3 accommodations she can request upon return to work Baseline:  Goal status: ONGOING       LONG TERM  GOALS: Target date:03/01/2022   Pt will carryover 4 strategies for attention and memory to recall pertinent details from 2 paragraphs and from phone calls/conversations over 2 sessions Baseline:  Goal status: ONGOING   2.  Pt will verbalize 4 accommodations she can use upon return to work with rare min A Baseline:  Goal status: ONGOING   3.  Pt will improve score on cognitive function PROM -  Baseline: 67 Goal status: ONGOING   ASSESSMENT:   CLINICAL IMPRESSION: Patient is a 56 y.o. female who was seen today for impairments in memory and attention. she presents with mild high level cognitive linguistic impairment. Ongoing training for compensatory strategies for attention/processing, memory. 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 She also has s/s of VCD/Inspiratory Laryngeal Obstruction. I recommend skilled ST to maximize cognition and train in compensatory strategies for cognitive communication impairments to maximize successful return to work.   OBJECTIVE IMPAIRMENTS include attention, memory, and expressive language. These impairments are limiting patient from return to work and effectively communicating at home and in community. Factors affecting potential to achieve goals and functional outcome are none.. Patient will benefit from skilled SLP services to address above impairments  and improve overall function.   REHAB POTENTIAL: Good   PLAN: SLP FREQUENCY: 2x/week   SLP DURATION: 8 weeks   PLANNED INTERVENTIONS: Language facilitation, Environmental controls, Cueing hierachy, Cognitive reorganization, and Internal/external aids         Cordero Surette, Annye Rusk, CCC-SLP 01/10/2022, 1:17 PM

## 2022-01-12 ENCOUNTER — Encounter: Payer: Self-pay | Admitting: Occupational Therapy

## 2022-01-12 ENCOUNTER — Ambulatory Visit: Payer: BC Managed Care – PPO | Admitting: Speech Pathology

## 2022-01-12 ENCOUNTER — Encounter: Payer: Self-pay | Admitting: Speech Pathology

## 2022-01-12 ENCOUNTER — Ambulatory Visit: Payer: BC Managed Care – PPO | Admitting: Occupational Therapy

## 2022-01-12 DIAGNOSIS — R41841 Cognitive communication deficit: Secondary | ICD-10-CM

## 2022-01-12 DIAGNOSIS — R498 Other voice and resonance disorders: Secondary | ICD-10-CM

## 2022-01-12 DIAGNOSIS — R278 Other lack of coordination: Secondary | ICD-10-CM

## 2022-01-12 DIAGNOSIS — M6281 Muscle weakness (generalized): Secondary | ICD-10-CM | POA: Diagnosis not present

## 2022-01-12 NOTE — Therapy (Signed)
OUTPATIENT OCCUPATIONAL THERAPY TREATMENT NOTE  Patient Name: Megan Reid MRN: [id number] DOB:Jan 14, 1966, 56 y.o., female Today's Date: 01/12/2022  PCP: Leeroy Cha, MD REFERRING PROVIDER: Gertie Gowda, DO   OT End of Session - 01/12/22 1107     Visit Number 2    Number of Visits 7    Date for OT Re-Evaluation 02/10/22    Authorization Type [employer]    Authorization Time Period VL: 33    OT Start Time 1103    OT Stop Time 1143    OT Time Calculation (min) 40 min    Activity Tolerance Patient tolerated treatment well    Behavior During Therapy WFL for tasks assessed/performed            Past Medical History:  Diagnosis Date   Arthralgia 02/14/2016   Asthma    Autoimmune disease ([place]) 02/14/2016   Positive RF 139 CCP Negative  Positive ANA 1:640 Positive Ro Positive La Elevated 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 - Sjorgen's Disease   DDD (degenerative disc disease), lumbar 02/14/2016   Fatigue 02/14/2016   Gastritis 02/14/2016   Hot flashes    Migraines 02/14/2016   Osteoarthritis of both hands 02/14/2016   Osteoarthritis of both knees 02/14/2016   Vitamin D deficiency 02/14/2016   Past Surgical History:  Procedure Laterality Date   BREAST BIOPSY Left    ROTATOR CUFF REPAIR Right 06/16/2019   TUBAL LIGATION     Patient Active Problem List   Diagnosis Date Noted   Anemia 12/20/2021   Abnormal LFTs 67/89/3810   Toxic metabolic encephalopathy 17/51/0258   Hepatitis 12/05/2021   Acute hepatitis 12/04/2021   Thrombocytopenia ([place]) 12/04/2021   Acute renal failure (HCC)    Seasonal and perennial allergic rhinitis 12/02/2020   Chronic coughing 12/02/2020   Sjogren's syndrome ([place]) 12/02/2020   Leukopenia 12/02/2020   Adverse reaction to food, subsequent encounter 06/23/2020   Heartburn 06/23/2020   Bruising 06/23/2020   Abrasion of right ankle 02/13/2017   Rheumatoid factor positive 09/01/2016   Leucopenia 09/01/2016   Autoimmune disease ([place]) 02/14/2016   Fatigue  02/14/2016   DDD (degenerative disc disease), lumbar 02/14/2016   Osteoarthritis of both knees 02/14/2016   Osteoarthritis of both hands 02/14/2016   Asthma 02/14/2016   Migraines 02/14/2016   Gastritis 02/14/2016   Vitamin D deficiency 02/14/2016   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 female with history of DDD lumbar spine, asthma, autoimmune disease, Sjogren's with recent onset of trigeminal neuralgia and was started on Tegretol. She was also reported to have been taking ibuprofen as well as Tylenol for pain management. She was found to have metabolic encephalopathy with acute hepatic and renal failure. Renal failure treated with IV fluids and bicarb. AKI felt to be due to ATN from acute illness, diarrhea and ibuprofen use. She developed fever past admission and was treated with IV antibiotics due to concerns of sepsis. Work-up was negative for DR ESS, tickborne illness, CMV, EBV and acute hepatitis/pancytopenia felt to be secondary to Tegretol toxicity. She did not have pancytopenia and supportive care was recommended by heme-onc.  Renal status and abnormal LFTs were resolving however patient continued limited by weakness, DOE as well as encephalopathy with delayed processing. CIR was recommended due to functional decline.  PRECAUTIONS: Fall; no lifting more than 15 lbs, care w/ strenuous activity  PAIN: Are you having pain? 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), and Leisure: running  PATIENT GOALS: Get back to running; return to "everyday living;" improve reaching w/ RUE   OBJECTIVE:   HAND DOMINANCE: Right  ADLs: Overall ADLs: Pt reports able to complete BADLs w/ at least modified independence Grooming: Requires compensatory strategies for decreased RUE strength during hair care Bathing: Reports significant fatigue w/ showering; uses shower seat prn and when drying off Equipment: Shower seat with back, Grab bars, and tub/shower combo  IADLs: Light housekeeping: Mod Ind Meal Prep: Mod Ind Community mobility: Driving independently Medication management: Independent w/ pillbox Handwriting:  Pt reports feeling like her handwriting has become "sloppy"  UPPER EXTREMITY ROM     Active ROM Right Eval - 9/19  Shoulder flexion 69  Shoulder abduction 72  Shoulder adduction   Shoulder extension   Shoulder internal rotation   Shoulder external rotation 56  Elbow flexion WFL  Elbow extension   (Blank rows = not tested)  COORDINATION: 9 Hole Peg test: Right: 26.18 sec; Left: 25.49 sec   PATIENT EDUCATION: Pain management strategies Person educated: Patient Education method: Explanation Education comprehension: verbalized understanding   HOME EXERCISE PROGRAM: To be administered   GOALS: Goals reviewed with patient? Yes  SHORT TERM GOALS: Target date: 01/24/22    Status:  1 Pt will verbalize understanding of at least 2 energy conservation strategies/activity modification to improve efficiency during the day and facilitate return to work as a high school teacher  Met - 01/12/22  2 Pt will demonstrate independence w/ HEP designed for RUE strength, endurance, and coordination  Progressing     LONG TERM GOALS: Target date: 02/10/22    Status:  1 Pt will  improve efficiency during functional fine motor skills (e.g., buttons, ties, handwriting) by improving time to complete 9-HPT w/ dominant R hand to at least 25.5 sec  Baseline: Right: 26.18 sec; Left: 25.49 sec Progressing  2 Pt will improve ROM of R shoulder flexion to at least 110 degrees w/out pain to improve participation in work-related tasks (e.g., writing on whiteboard for class)  Baseline: 69 deg R shoulder flexion Progressing     ASSESSMENT:  CLINICAL IMPRESSION: Pt arrives for first treatment session following initial evaluation on 01/02/22. OT reviewed goals w/ pt who is agreeable to plan of care at this time.  PERFORMANCE DEFICITS in functional skills including ROM, strength, mobility, balance, body mechanics, endurance, and UE functional use, cognitive skills including memory.  IMPAIRMENTS are limiting patient from IADLs and work.   COMORBIDITIES may have co-morbidities  that affects occupational performance. Patient will benefit from skilled OT to address above impairments and improve overall function.   PLAN: OT FREQUENCY: 1x/week  OT DURATION: 6 weeks  PLANNED INTERVENTIONS: self care/ADL training, therapeutic exercise, therapeutic activity, manual therapy, functional mobility training, aquatic therapy, moist heat, cryotherapy, patient/family education, energy conservation, and DME and/or AE instructions  RECOMMENDED OTHER SERVICES: Currently receiving PT and SLP services  CONSULTED AND AGREED WITH PLAN OF CARE: Patient  PLAN FOR NEXT SESSION: Review energy conservation; introduce HEP      , MSOT, OTR/L 01/12/2022, 11:08 AM 

## 2022-01-12 NOTE — Therapy (Signed)
OUTPATIENT SPEECH LANGUAGE PATHOLOGY TREATMENT NOTE   Patient Name: Megan Reid MRN: [id number] DOB:March 17, 1966, 56 y.o., female Today's Date: 01/12/2022  PCP: Megan Grana MD REFERRING PROVIDER: Gertie Gowda, DO   END OF SESSION:   End of Session - 01/12/22 1242     Visit Number 5    Number of Visits 17    Date for SLP Re-Evaluation 03/01/22    SLP Start Time 1237    SLP Stop Time  1315    SLP Time Calculation (min) 38 min    Activity Tolerance Patient tolerated treatment well             Past Medical History:  Diagnosis Date   Arthralgia 02/14/2016   Asthma    Autoimmune disease ([place]) 02/14/2016   Positive RF 139 CCP Negative  Positive ANA 1:640 Positive Ro Positive La Elevated ESR 105 - Sjorgen's Disease   DDD (degenerative disc disease), lumbar 02/14/2016   Fatigue 02/14/2016   Gastritis 02/14/2016   Hot flashes    Migraines 02/14/2016   Osteoarthritis of both hands 02/14/2016   Osteoarthritis of both knees 02/14/2016   Vitamin D deficiency 02/14/2016   Past Surgical History:  Procedure Laterality Date   BREAST BIOPSY Left    ROTATOR CUFF REPAIR Right 06/16/2019   TUBAL LIGATION     Patient Active Problem List   Diagnosis Date Noted   Anemia 12/20/2021   Abnormal LFTs 41/96/2229   Toxic metabolic encephalopathy 79/89/2119   Hepatitis 12/05/2021   Acute hepatitis 12/04/2021   Thrombocytopenia ([place]) 12/04/2021   Acute renal failure (HCC)    Seasonal and perennial allergic rhinitis 12/02/2020   Chronic coughing 12/02/2020   Sjogren's syndrome ([place]) 12/02/2020   Leukopenia 12/02/2020   Adverse reaction to food, subsequent encounter 06/23/2020   Heartburn 06/23/2020   Bruising 06/23/2020   Abrasion of right ankle 02/13/2017   Rheumatoid factor positive 09/01/2016   Leucopenia 09/01/2016   Autoimmune disease ([place]) 02/14/2016   Fatigue 02/14/2016   DDD (degenerative disc disease), lumbar 02/14/2016   Osteoarthritis of both knees  02/14/2016   Osteoarthritis of both hands 02/14/2016   Asthma 02/14/2016   Migraines 02/14/2016   Gastritis 02/14/2016   Vitamin D deficiency 02/14/2016   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 Yes; 8/10 left hip, hot/cold    OBJECTIVE:   TODAY'S TREATMENT:  01/12/22: Megan Reid reports that she has used VCD breathing to reduce duration of VCD - he husband commented "That was over so quick" as she was successful using the abdominal sniff/blow. Trigger was the mint smell of her toothpaste. Instructed her to "pre-treat" the VCD using the abdominal breathing before she encounters a trigger, with goal to eliminate cough. She demonstrated abdominal sniff blow with occasional min A for shoulders down and to relax neck and shoulders 3x. Instructed her to put a sign on her computer and in her class desk for shoulders down - she is working on this with OT as well. Megan Reid verbalizes carryover of strategies for attention and memory including reducing background noise, advocating  for no interruptions, breaking tasks into smaller parts. She is aware of strategies and accommodations for attention and memory upon return to work.   01/10/22: Megan Reid reports success attending to and responding to 2 conversations at once, her family noted the success. She reports she successfully used abdominal sniff-blow to control a coughing episode on an airplane this past week. She continues to practice VCD breathing tx. We generated 2 activities to do  at home to target multi tasking for return to classroom, including listening to podcasts with background noises and "working" on practice task on computer with TV. We generated strategy of using visuals to keep classroom noise to a minimum. 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 Megan Reid verbalized 3 strategies to compensation for reduced attention in the classroom with rare min A.   9/21/223: Initiated training in compensatory strategies for attention, including external strategies of reducing back ground noise, getting important information in writing, repeating back what she has heard. Megan Reid generated strategy of taking notes during staff meetings then comparing her notes to the secretary's to ensure she has all of the information she needs. 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 Megan Reid reports she has began to take notes on important conversations and information from HR, MD, insurance - I instructed her to read them back to the other person to ensure she has all details correct. During session, Megan Reid had coughing attack triggered by a smell. She reports this has been going on for 3 years, she is on reflux meds. 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  Briefly demonstrated breathing techniques for VCD and provided name of Dr. Chase Caller should she want to pursue work up for this.   PATIENT EDUCATION: Education  details: compensations for attention and memory, accommodations for return to work, see Patient Instructions Person educated: Patient Education method: Explanation, Verbal cues, and Handouts Education comprehension: verbal cues required and needs further education         GOALS: Goals reviewed with patient? Yes   SHORT TERM GOALS: Target date: 02/01/2022   Pt will use compensations for attention and memory to report 50% reduction in loosing household items (subjectively) Baseline: Goal status: ONGOING   2.  Pt will carryover 3 strategies to recall 4 details from paragraph over 2 sessions Baseline:  Goal status: ONGOING   3.  Pt will carryover 2 strategies to recall pertinent information from work, insurance, healthcare providers over 1 week Baseline:  Goal status: ONGOING   4.  Pt will verbalize 3 accommodations she can request upon return to work Baseline:  Goal status: Goal Met       LONG TERM GOALS: Target date:03/01/2022   Pt will carryover 4 strategies for attention and memory to recall pertinent details from 2 paragraphs and from phone calls/conversations over 2 sessions Baseline:  Goal status: ONGOING   2.  Pt will verbalize 4 accommodations she can use upon return to work with rare min A Baseline:  Goal status: ONGOING  3.  Pt will improve score on cognitive function PROM -  Baseline: 67 Goal status: ONGOING  4. Pt will report 50% (subjectively) reduced coughing episodes.                Goal Status: ONGOING   ASSESSMENT:   CLINICAL IMPRESSION: Patient is a 56 y.o. female who was seen today for impairments in memory and attention. she presents with mild high level cognitive linguistic impairment. Ongoing training for compensatory strategies for attention/processing, memory. 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 She also has s/s of VCD/Inspiratory Laryngeal Obstruction. I recommend  skilled ST to maximize cognition and train in compensatory strategies for cognitive communication impairments to maximize successful return to work. Added VCD goal.   OBJECTIVE IMPAIRMENTS include attention, memory, and expressive language. These impairments are limiting patient from return to work and effectively communicating at home and in community. Factors affecting potential to achieve goals and functional outcome are none.. Patient will benefit from skilled SLP services to address above impairments and improve overall function.   REHAB POTENTIAL: Good   PLAN: SLP FREQUENCY: 2x/week   SLP DURATION: 8 weeks   PLANNED INTERVENTIONS: Language facilitation, Environmental controls, Cueing hierachy, Cognitive reorganization, and Internal/external aids         Taniah Reinecke, Annye Rusk, CCC-SLP 01/12/2022, 3:03 PM

## 2022-01-12 NOTE — Patient Instructions (Signed)
   Practice the abdominal sniff blow through out the day  Use is it to "pre-treat" when you know you will encounter a trigger   Put a sign in your house at your computer and at your desk at school for shoulders down  [place] job using the sniff blow to help stop your cough!!  When you feel a tickle, use the breath to try to stave off the cough  Practice some multi tasking

## 2022-01-16 ENCOUNTER — Other Ambulatory Visit: Payer: Self-pay

## 2022-01-16 ENCOUNTER — Ambulatory Visit: Payer: BC Managed Care – PPO | Admitting: Occupational Therapy

## 2022-01-16 ENCOUNTER — Ambulatory Visit: Payer: BC Managed Care – PPO | Attending: Physical Medicine and Rehabilitation

## 2022-01-16 ENCOUNTER — Encounter: Payer: Self-pay | Admitting: Occupational Therapy

## 2022-01-16 DIAGNOSIS — R41841 Cognitive communication deficit: Secondary | ICD-10-CM | POA: Insufficient documentation

## 2022-01-16 DIAGNOSIS — M6281 Muscle weakness (generalized): Secondary | ICD-10-CM | POA: Diagnosis present

## 2022-01-16 DIAGNOSIS — R498 Other voice and resonance disorders: Secondary | ICD-10-CM | POA: Insufficient documentation

## 2022-01-16 DIAGNOSIS — R2681 Unsteadiness on feet: Secondary | ICD-10-CM | POA: Insufficient documentation

## 2022-01-16 DIAGNOSIS — R278 Other lack of coordination: Secondary | ICD-10-CM | POA: Diagnosis present

## 2022-01-16 DIAGNOSIS — M545 Low back pain, unspecified: Secondary | ICD-10-CM | POA: Diagnosis present

## 2022-01-16 DIAGNOSIS — G8929 Other chronic pain: Secondary | ICD-10-CM | POA: Insufficient documentation

## 2022-01-16 NOTE — Therapy (Signed)
OUTPATIENT PHYSICAL THERAPY NEURO EVALUATION   Patient Name: CHALISE PE MRN: [id number] DOB:[birth year], 56 y.o., female Today's Date: 01/16/2022   PCP: Leeroy Cha  REFERRING PROVIDER: Gertie Gowda, DO    PT End of Session - 01/16/22 1321     Visit Number 2    PT Start Time 8648    PT Stop Time 1400    PT Time Calculation (min) 39 min    Behavior During Therapy [facility] for tasks assessed/performed              Past Medical History:  Diagnosis Date   Arthralgia 02/14/2016   Asthma    Autoimmune disease ([place]) 02/14/2016   Positive RF 139 CCP Negative  Positive ANA 1:640 Positive Ro Positive La Elevated ESR 105 - Sjorgen's Disease   DDD (degenerative disc disease), lumbar 02/14/2016   Fatigue 02/14/2016   Gastritis 02/14/2016   Hot flashes    Migraines 02/14/2016   Osteoarthritis of both hands 02/14/2016   Osteoarthritis of both knees 02/14/2016   Vitamin D deficiency 02/14/2016   Past Surgical History:  Procedure Laterality Date   BREAST BIOPSY Left    ROTATOR CUFF REPAIR Right 06/16/2019   TUBAL LIGATION     Patient Active Problem List   Diagnosis Date Noted   Anemia 12/20/2021   Abnormal LFTs 47/20/7218   Toxic metabolic encephalopathy 28/83/3744   Hepatitis 12/05/2021   Acute hepatitis 12/04/2021   Thrombocytopenia ([place]) 12/04/2021   Acute renal failure (HCC)    Seasonal and perennial allergic rhinitis 12/02/2020   Chronic coughing 12/02/2020   Sjogren's syndrome ([place]) 12/02/2020   Leukopenia 12/02/2020   Adverse reaction to food, subsequent encounter 06/23/2020   Heartburn 06/23/2020   Bruising 06/23/2020   Abrasion of right ankle 02/13/2017   Rheumatoid factor positive 09/01/2016   Leucopenia 09/01/2016   Autoimmune disease ([place]) 02/14/2016   Fatigue 02/14/2016   DDD (degenerative disc disease), lumbar 02/14/2016   Osteoarthritis of both knees 02/14/2016   Osteoarthritis of both hands 02/14/2016   Asthma 02/14/2016   Migraines  02/14/2016   Gastritis 02/14/2016   Vitamin D deficiency 02/14/2016   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[place]) 02/14/2016   High risk medication use 02/14/2016    ONSET DATE: 12/21/2021   REFERRING DIAG: G92.8 (ZHQ-60-QN) - Toxic metabolic encephalopathy   THERAPY DIAG:  Muscle weakness (generalized)  Other lack of coordination  Muscle weakness  Unsteadiness on feet  Rationale for Evaluation and Treatment Rehabilitation  SUBJECTIVE:  SUBJECTIVE STATEMENT: I am doing fine, no pain, I just feel weak.   Pt accompanied by: self  PERTINENT HISTORY: Brief HPI:   EMANUEL CAMPOS is a 56 y.o. female with history of DDD lumbar spine, asthma, autoimmune disease, Sjogren's with recent onset of trigeminal neuralgia and was started on Tegretol.  She was also reported to have been taking ibuprofen as well as Tylenol for pain management.  She was found to have metabolic encephalopathy with acute hepatic and renal failure.  Renal failure treated with IV fluids and bicarb .  AKI felt to be due to ATN from acute illness, diarrhea and ibuprofen use.  She developed fever past admission and was treated with IV antibiotics due to concerns of sepsis.  Work-up was negative for DR ESS, tickborne illness, CMV, EBV and acute hepatitis/pancytopenia felt to be secondary to Tegretol toxicity.  She did not have pancytopenia and supportive care was recommended by heme-onc.  Renal status and abnormal LFTs were resolving however patient continued limited by weakness, DOE as well as encephalopathy with delayed processing.  CIR was recommended due to functional decline.     Hospital Course: JAZLYNNE MILLINER was admitted to rehab 12/12/2021 for inpatient therapies to consist of PT, ST and OT at least three hours five days  a week. Past admission physiatrist, therapy team and rehab RN have worked together to provide customized collaborative inpatient rehab.  Her blood pressures were monitored on TID basis and has been stable.  Follow-up check of electrolytes revealed renal status as well as abnormal LFTs to be gradually improving.  She was found to have drop in magnesium to 1.3 requiring IV supplementation as well as p.o. supplement.  Repeat check showed improvement with magnesium of 2.0.  Recommend repeat check in 1 to 2 weeks to monitor for stability as well as need to continue oral supplementation.   Follow-up CBC showed improvement in white count with stable H&H and that thrombocytopenia had resolved.  Her alertness and mentation has gradually improved with improvement in activity tolerance and respiratory status.  Hydroxyzine 10 mg 3 times daily as needed was added to help manage anxiety.  Sleep-wake disruption is improved with addition of melatonin.  Constipation has resolved with augmentation of bowel regimen.  She has made steady gains during her rehab stay and is modified independent at discharge.  Supervision is recommended with cognitive task and for safety.  She will continue to receive follow-up outpatient PT, OT and ST at [facility] outpatient rehab after discharge.     Rehab course: During patient's stay in rehab weekly team conferences were held to monitor patient's progress, set goals and discuss barriers to discharge. At admission, patient required min assist with mobility and with ADL tasks.  She exhibited mild cognitive deficits affecting recall, memory and executive function with SLUMS score 22/30. She  has had improvement in activity tolerance, balance, postural control as well as ability to compensate for deficits. She is able to complete ADL tasks at modified independent level. She is independent for transfers and is able to ambulate 150' without AD. She is able to climb 12 stairs independently. She  requires supervision with functional and complex cognitive tasks. Her SLUMS score has improved to 26/30. Family education has been completed.    Disposition: Home  PAIN:  Are you having pain? No  PRECAUTIONS: Fall  WEIGHT BEARING RESTRICTIONS No  FALLS: Has patient fallen in last 6 months? Yes. Number of falls 1  LIVING ENVIRONMENT: Lives with: lives  with their spouse Lives in: House/apartment Stairs: 4 STE B rails, home is handicap accessible except for bathtub  Has following equipment at home: None  PLOF: Independent and Independent with basic ADLs  PATIENT GOALS build strength and balance, endurance   OBJECTIVE:   DIAGNOSTIC FINDINGS:   COGNITION: Overall cognitive status: Impaired- memory issues after acute medical problem    SENSATION: Not tested   LOWER EXTREMITY ROM:     Active  Right Eval Left Eval  Hip flexion    Hip extension    Hip abduction    Hip adduction    Hip internal rotation    Hip external rotation    Knee flexion    Knee extension    Ankle dorsiflexion    Ankle plantarflexion    Ankle inversion    Ankle eversion     (Blank rows = not tested)  LOWER EXTREMITY MMT:    MMT Right Eval Left Eval  Hip flexion 3 3  Hip extension 2 2  Hip abduction 3 4-  Hip adduction    Hip internal rotation    Hip external rotation    Knee flexion 4 4  Knee extension 4 4  Ankle dorsiflexion 3 3  Ankle plantarflexion    Ankle inversion    Ankle eversion    (Blank rows = not tested)  BED MOBILITY:  Sit to supine Complete Independence Supine to sit Complete Independence Rolling to Right Complete Independence Rolling to Left Complete Independence  TRANSFERS: Assistive device utilized: None  Sit to stand: Complete Independence Stand to sit: Complete Independence Chair to chair: Complete Independence Floor:    GAIT: Gait pattern: WFL and trendelenburg Distance walked: 644f  Assistive device utilized: None Level of assistance: Complete  Independence Comments: WNL   FUNCTIONAL TESTs:  Berg Balance Scale: 42/56 2MWT 6764f  TODAY'S TREATMENT:  01/16/22 NuStep L5 x6m69m Bridges 2x10 SLR 2# 2x10 STS w/OHP x10, x10 with push out  Step ups 6"  Leg ext 5# 2x10 HS curls 25# 2x10  Resisted gait 20# 4 way x4   01/12/22 Nustep L4 x6 minutes  Bridges x5  Sidelying hip ABD x5 B   PATIENT EDUCATION: Education details: exam findings, POC, HEP  Person educated: Patient Education method: ExpConsulting civil engineeremonstration, and Handouts Education comprehension: verbalized understanding and returned demonstration   HOME EXERCISE PROGRAM: [id number]    GOALS: Goals reviewed with patient? Yes  SHORT TERM GOALS: Target date: 01/30/2022  Will be compliant with appropriate progressive HEP  Baseline: Goal status: INITIAL  2.  Will be able to name at least 3 ways to reduce fall risk at home and in community  Baseline:  Goal status: INITIAL  3.  Will tolerate standing tasks for at least 90 minutes without increase in fatigue  Baseline:  Goal status: INITIAL  4.  Will be able to walk for at least 15 minutes without rest to show improved functional activity tolerance  Baseline:  Goal status: INITIAL    LONG TERM GOALS: Target date: 02/27/2022  MMT to improve by at least 1 grade in all weak groups  Baseline:  Goal status: INITIAL  2.  Will score at least 50 on the Berg to show improved functional balance  Baseline:  Goal status: INITIAL  3.  Will tolerate at least 1/2 day at work without fatigue to show improved functional activity tolerance  Baseline:  Goal status: INITIAL  4.  Will be independent with appropriate gym based exercise program to maintain functional gains  and continue progress on an independent basis  Baseline:  Goal status: INITIAL   ASSESSMENT:  CLINICAL IMPRESSION: Mrs.Jezel arrives to PT doing well, not having pain. We worked on a lot of general LE strengthening. She has some LOB with  resisted gait side steps when bringing feet together. She tolerated session really well and will benefit from ongoing balance, coordination, and strength activities. Will also benefit from endurance training as states she is fatiguing very easily.   OBJECTIVE IMPAIRMENTS cardiopulmonary status limiting activity, decreased activity tolerance, decreased balance, decreased cognition, decreased mobility, difficulty walking, decreased strength, and improper body mechanics.   ACTIVITY LIMITATIONS standing, squatting, stairs, transfers, bed mobility, locomotion level, and caring for others  PARTICIPATION LIMITATIONS: community activity, occupation, and yard work  PERSONAL FACTORS Age, Fitness, Past/current experiences, and Time since onset of injury/illness/exacerbation are also affecting patient's functional outcome.   REHAB POTENTIAL: Good  CLINICAL DECISION MAKING: Evolving/moderate complexity  EVALUATION COMPLEXITY: Moderate  PLAN: PT FREQUENCY: 2x/week  PT DURATION: 8 weeks  PLANNED INTERVENTIONS: Therapeutic exercises, Therapeutic activity, Neuromuscular re-education, Balance training, Gait training, Patient/Family education, Self Care, Joint mobilization, Stair training, Dry Needling, Cognitive remediation, Cryotherapy, Moist heat, Taping, Ultrasound, Ionotophoresis 87m/ml Dexamethasone, Manual therapy, and Re-evaluation  PLAN FOR NEXT SESSION: focus on functional activity tolerance, balance, strength    Kristen U PT DPT PN2  01/16/2022, 2:01 PM

## 2022-01-16 NOTE — Therapy (Signed)
OUTPATIENT OCCUPATIONAL THERAPY TREATMENT NOTE  Patient Name: Megan Reid MRN: [id number] DOB:07/19/1965, 56 y.o., female Today's Date: 01/16/2022  PCP: Leeroy Cha, MD REFERRING PROVIDER: Gertie Gowda, DO   OT End of Session - 01/16/22 1401     Visit Number 3    Number of Visits 7    Date for OT Re-Evaluation 02/10/22    Authorization Type [employer]    Authorization Time Period VL: 40    OT Start Time 1400    OT Stop Time 1440    OT Time Calculation (min) 40 min    Activity Tolerance Patient tolerated treatment well    Behavior During Therapy WFL for tasks assessed/performed            Past Medical History:  Diagnosis Date   Arthralgia 02/14/2016   Asthma    Autoimmune disease ([place]) 02/14/2016   Positive RF 139 CCP Negative  Positive ANA 1:640 Positive Ro Positive La Elevated ESR 105 - Sjorgen's Disease   DDD (degenerative disc disease), lumbar 02/14/2016   Fatigue 02/14/2016   Gastritis 02/14/2016   Hot flashes    Migraines 02/14/2016   Osteoarthritis of both hands 02/14/2016   Osteoarthritis of both knees 02/14/2016   Vitamin D deficiency 02/14/2016   Past Surgical History:  Procedure Laterality Date   BREAST BIOPSY Left    ROTATOR CUFF REPAIR Right 06/16/2019   TUBAL LIGATION     Patient Active Problem List   Diagnosis Date Noted   Anemia 12/20/2021   Abnormal LFTs 93/73/4287   Toxic metabolic encephalopathy 68/02/5725   Hepatitis 12/05/2021   Acute hepatitis 12/04/2021   Thrombocytopenia ([place]) 12/04/2021   Acute renal failure (HCC)    Seasonal and perennial allergic rhinitis 12/02/2020   Chronic coughing 12/02/2020   Sjogren's syndrome ([place]) 12/02/2020   Leukopenia 12/02/2020   Adverse reaction to food, subsequent encounter 06/23/2020   Heartburn 06/23/2020   Bruising 06/23/2020   Abrasion of right ankle 02/13/2017   Rheumatoid factor positive 09/01/2016   Leucopenia 09/01/2016   Autoimmune disease ([place]) 02/14/2016   Fatigue  02/14/2016   DDD (degenerative disc disease), lumbar 02/14/2016   Osteoarthritis of both knees 02/14/2016   Osteoarthritis of both hands 02/14/2016   Asthma 02/14/2016   Migraines 02/14/2016   Gastritis 02/14/2016   Vitamin D deficiency 02/14/2016   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 female with history of DDD lumbar spine, asthma, autoimmune disease, Sjogren's with recent onset of trigeminal neuralgia and was started on Tegretol. She was also reported to have been taking ibuprofen as well as Tylenol for pain management. She was found to have metabolic encephalopathy with acute hepatic and renal failure. Renal failure treated with IV fluids and bicarb. AKI felt to be due to ATN from acute illness, diarrhea and ibuprofen use. She developed fever past admission and was treated with IV antibiotics due to concerns of sepsis. Work-up was negative for DR ESS, tickborne illness, CMV, EBV and  acute hepatitis/pancytopenia felt to be secondary to Tegretol toxicity. She did not have pancytopenia and supportive care was recommended by heme-onc. Renal status and abnormal LFTs were resolving however patient continued limited by weakness, DOE as well as encephalopathy with delayed processing. CIR was recommended due to functional decline.  PRECAUTIONS: Fall; no lifting more than 15 lbs, care w/ strenuous  activity  PAIN: Are you having pain? No  PLOF: Independent, Vocation/Vocational requirements: high Education officer, museum (full-time), and Leisure: running  PATIENT GOALS: Get back to running; return to "everyday living;" improve reaching w/ RUE   OBJECTIVE:   HAND DOMINANCE: Right  UPPER EXTREMITY ROM     Active ROM Right Eval - 9/19 Right 10/2  Shoulder flexion 69 106  Shoulder abduction 72   Shoulder adduction    Shoulder extension    Shoulder internal rotation    Shoulder external rotation 56   Elbow flexion WFL   Elbow extension    (Blank rows = not tested)   TODAY'S TREATMENT:  Therapeutic Exercise R shoulder flexion x15 w/ anchored resistance (red theraband); cues for maintaining elbow extension and preventing R shoulder hiking. Completed in front of mirror.  R shoulder extension x15 w/ anchored resistance (red theraband) while standing  R shoulder ER x15 w/ anchored resistance (red theraband); cues for preventing compensatory trunk rotation  Energy Conservation Strategies Problem-solved w/ pt to identify potentially beneficial energy conservation/compensatory strategies, particularly related to return to work on the 23rd of this month. 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 Pt instructed to write out her biggest concerns in regards to return to work and help OT facilitate adaptations or modifications that may help manage symptoms w/ return to teaching.    PATIENT EDUCATION: See tx section above Person educated: Patient Education method: Explanation Education comprehension: verbalized understanding   HOME EXERCISE PROGRAM: Coordination activities   GOALS: Goals reviewed with patient? 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  Met - 01/12/22  2 Pt will demonstrate independence w/ HEP designed for RUE strength, endurance, and coordination  Progressing     LONG TERM GOALS: Target date: 02/10/22    Status:  1 Pt will improve efficiency during functional fine motor skills (e.g., buttons, ties, handwriting) by improving time to complete 9-HPT w/ dominant R hand to at least 25.5 sec  Baseline: Right: 26.18 sec; Left: 25.49 sec Progressing  2 Pt will improve ROM of R shoulder flexion to at least 110 degrees w/out pain to improve participation in work-related tasks (e.g., writing on whiteboard for class)  Baseline: 69 deg R shoulder flexion Progressing - 01/16/22: 106 deg     ASSESSMENT:  CLINICAL IMPRESSION: Pt continues to progress well toward functional goals, demonstrating improved R shoulder flexion ROM by 37 degrees since measurement taken during initial evaluation. Considering this, OT addressed endurance and strength w/ theraband exercises, emphasizing form and technique over quantity; pt verbalized understanding. OT also facilitated thorough discussion regarding review of energy conservation strategies and pt-specific examples of how she could implement these techniques into her daily routine due to her report of  concern w/ returning to work.   PERFORMANCE DEFICITS in functional skills including ROM, strength, mobility, balance, body mechanics, endurance, and UE functional use, cognitive skills including memory.  IMPAIRMENTS are limiting patient from IADLs and work.   COMORBIDITIES may have co-morbidities  that affects occupational performance. Patient will benefit from skilled OT to address above impairments and improve overall function.   PLAN: OT FREQUENCY: 1x/week  OT DURATION: 6 weeks  PLANNED  INTERVENTIONS: self care/ADL training, therapeutic exercise, therapeutic activity, manual therapy, functional mobility training, aquatic therapy, moist heat, cryotherapy, patient/family education, energy conservation, and DME and/or AE instructions  RECOMMENDED OTHER SERVICES: Currently receiving PT and SLP services  CONSULTED AND AGREED WITH PLAN OF CARE: Patient  PLAN FOR NEXT SESSION: Review energy conservation regarding return to work; continue w/ R shoulder ROM and tolerance/endurance   Kathrine Cords, MSOT, OTR/L 01/16/2022, 2:48 PM

## 2022-01-17 NOTE — Therapy (Signed)
OUTPATIENT PHYSICAL THERAPY NEURO TREATMENT   Patient Name: Megan Reid MRN: [id number] DOB:[birth year], 56 y.o., female Today's Date: 01/18/2022   PCP: Leeroy Cha  REFERRING PROVIDER: Gertie Gowda, DO    PT End of Session - 01/18/22 1109     Visit Number 3    PT Start Time 1108    PT Stop Time 4818    PT Time Calculation (min) 37 min    Behavior During Therapy [facility] for tasks assessed/performed               Past Medical History:  Diagnosis Date   Arthralgia 02/14/2016   Asthma    Autoimmune disease ([place]) 02/14/2016   Positive RF 139 CCP Negative  Positive ANA 1:640 Positive Ro Positive La Elevated ESR 105 - Sjorgen's Disease   DDD (degenerative disc disease), lumbar 02/14/2016   Fatigue 02/14/2016   Gastritis 02/14/2016   Hot flashes    Migraines 02/14/2016   Osteoarthritis of both hands 02/14/2016   Osteoarthritis of both knees 02/14/2016   Vitamin D deficiency 02/14/2016   Past Surgical History:  Procedure Laterality Date   BREAST BIOPSY Left    ROTATOR CUFF REPAIR Right 06/16/2019   TUBAL LIGATION     Patient Active Problem List   Diagnosis Date Noted   Anemia 12/20/2021   Abnormal LFTs 59/12/3110   Toxic metabolic encephalopathy 16/24/4695   Hepatitis 12/05/2021   Acute hepatitis 12/04/2021   Thrombocytopenia ([place]) 12/04/2021   Acute renal failure (HCC)    Seasonal and perennial allergic rhinitis 12/02/2020   Chronic coughing 12/02/2020   Sjogren's syndrome ([place]) 12/02/2020   Leukopenia 12/02/2020   Adverse reaction to food, subsequent encounter 06/23/2020   Heartburn 06/23/2020   Bruising 06/23/2020   Abrasion of right ankle 02/13/2017   Rheumatoid factor positive 09/01/2016   Leucopenia 09/01/2016   Autoimmune disease ([place]) 02/14/2016   Fatigue 02/14/2016   DDD (degenerative disc disease), lumbar 02/14/2016   Osteoarthritis of both knees 02/14/2016   Osteoarthritis of both hands 02/14/2016   Asthma 02/14/2016   Migraines  02/14/2016   Gastritis 02/14/2016   Vitamin D deficiency 02/14/2016   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   Pt accompanied by: self  PERTINENT HISTORY: Brief HPI:   Megan Reid is a 56 y.o. female with history of DDD lumbar spine, asthma, autoimmune disease, Sjogren's with recent onset of trigeminal neuralgia and was started on Tegretol.  She was also reported to have been taking ibuprofen as well as Tylenol for pain management.  She was found to have metabolic encephalopathy with acute hepatic and renal failure.  Renal failure treated with IV fluids and bicarb .  AKI felt to be due to ATN from acute illness, diarrhea and ibuprofen use.  She developed fever past admission and was treated with IV antibiotics due to concerns of sepsis.  Work-up was negative for DR ESS, tickborne illness, CMV, EBV and acute hepatitis/pancytopenia felt to be secondary to Tegretol toxicity.  She did not have pancytopenia and supportive care was recommended by heme-onc.  Renal status and abnormal LFTs were resolving however patient continued limited by weakness, DOE as well as encephalopathy with delayed processing.  CIR was recommended due to functional decline.     Hospital Course: Megan Reid was admitted to rehab 12/12/2021 for inpatient therapies to consist of  PT, ST and OT at least three hours five days a week. Past admission physiatrist, therapy team and rehab RN have worked together to provide customized collaborative inpatient rehab.  Her blood pressures were monitored on TID basis and has been stable.  Follow-up check of electrolytes revealed renal status as well as abnormal LFTs to be gradually improving.  She was found to have drop in magnesium to 1.3 requiring IV supplementation as well as p.o. supplement.  Repeat check showed improvement with magnesium of 2.0.  Recommend repeat check in 1 to 2 weeks to monitor for stability as well as need to continue oral supplementation.   Follow-up CBC showed improvement in white count with stable H&H and that thrombocytopenia had resolved.  Her alertness and mentation has gradually improved with improvement in activity tolerance and respiratory status.  Hydroxyzine 10 mg 3 times daily as needed was added to help manage anxiety.  Sleep-wake disruption is improved with addition of melatonin.  Constipation has resolved with augmentation of bowel regimen.  She has made steady gains during her rehab stay and is modified independent at discharge.  Supervision is recommended with cognitive task and for safety.  She will continue to receive follow-up outpatient PT, OT and ST at [facility] outpatient rehab after discharge.     Rehab course: During patient's stay in rehab weekly team conferences were held to monitor patient's progress, set goals and discuss barriers to discharge. At admission, patient required min assist with mobility and with ADL tasks.  She exhibited mild cognitive deficits affecting recall, memory and executive function with SLUMS score 22/30. She  has had improvement in activity tolerance, balance, postural control as well as ability to compensate for deficits. She is able to complete ADL tasks at modified independent level. She is independent for transfers and is able to ambulate 150' without AD. She is able  to climb 12 stairs independently. She requires supervision with functional and complex cognitive tasks. Her SLUMS score has improved to 26/30. Family education has been completed.    Disposition: Home  PAIN:  Are you having pain? No  PRECAUTIONS: Fall  WEIGHT BEARING RESTRICTIONS No  FALLS: Has patient fallen in last 6 months? Yes. Number  of falls 1  LIVING ENVIRONMENT: Lives with: lives with their spouse Lives in: House/apartment Stairs: 4 STE B rails, home is handicap accessible except for bathtub  Has following equipment at home: None  PLOF: Independent and Independent with basic ADLs  PATIENT GOALS build strength and balance, endurance   OBJECTIVE:   DIAGNOSTIC FINDINGS:   COGNITION: Overall cognitive status: Impaired- memory issues after acute medical problem    SENSATION: Not tested   LOWER EXTREMITY ROM:     Active  Right Eval Left Eval  Hip flexion    Hip extension    Hip abduction    Hip adduction    Hip internal rotation    Hip external rotation    Knee flexion    Knee extension    Ankle dorsiflexion    Ankle plantarflexion    Ankle inversion    Ankle eversion     (Blank rows = not tested)  LOWER EXTREMITY MMT:    MMT Right Eval Left Eval  Hip flexion 3 3  Hip extension 2 2  Hip abduction 3 4-  Hip adduction    Hip internal rotation    Hip external rotation    Knee flexion 4 4  Knee extension 4 4  Ankle dorsiflexion 3 3  Ankle plantarflexion    Ankle inversion    Ankle eversion    (Blank rows = not tested)  BED MOBILITY:  Sit to supine Complete Independence Supine to sit Complete Independence Rolling to Right Complete Independence Rolling to Left Complete Independence  TRANSFERS: Assistive device utilized: None  Sit to stand: Complete Independence Stand to sit: Complete Independence Chair to chair: Complete Independence Floor:    GAIT: Gait pattern: WFL and trendelenburg Distance walked: 617f  Assistive device  utilized: None Level of assistance: Complete Independence Comments: WNL   FUNCTIONAL TESTs:  Berg Balance Scale: 42/56 2MWT 6737f  TODAY'S TREATMENT:  01/18/22 NuStep L6 x5m57m  Walking on beam narrow steps and side steps  Side steps on airex  Lateral band walks greenTB  Calf raises on black bar 2x10 Leg press 20# x10, 30# x10 Shoulder rows and ext greenTB 2x10  01/16/22 NuStep L5 x6mi49mBridges 2x10 SLR 2# 2x10 STS w/OHP x10, x10 with push out  Step ups 6"  Leg ext 5# 2x10 HS curls 25# 2x10  Resisted gait 20# 4 way x4   01/12/22 Nustep L4 x6 minutes  Bridges x5  Sidelying hip ABD x5 B   PATIENT EDUCATION: Education details: exam findings, POC, HEP  Person educated: Patient Education method: ExplConsulting civil engineermonstration, and Handouts Education comprehension: verbalized understanding and returned demonstration   HOME EXERCISE PROGRAM: [id number]    GOALS: Goals reviewed with patient? Yes  SHORT TERM GOALS: Target date: 01/30/2022  Will be compliant with appropriate progressive HEP  Baseline: Goal status: INITIAL  2.  Will be able to name at least 3 ways to reduce fall risk at home and in community  Baseline:  Goal status: INITIAL  3.  Will tolerate standing tasks for at least 90 minutes without increase in fatigue  Baseline:  Goal status: INITIAL  4.  Will be able to walk for at least 15 minutes without rest to show improved functional activity tolerance  Baseline:  Goal status: INITIAL    LONG TERM GOALS: Target date: 02/27/2022  MMT to improve by at least 1 grade in all weak groups  Baseline:  Goal status: INITIAL  2.  [facility]  to show improved functional balance  Baseline:  Goal status: INITIAL  3.  Will tolerate at least 1/2 day at work without fatigue to show improved functional activity tolerance  Baseline:  Goal status: INITIAL  4.  Will be independent with appropriate gym based exercise program to maintain  functional gains and continue progress on an independent basis  Baseline:  Goal status: INITIAL   ASSESSMENT:  CLINICAL IMPRESSION: Patient arrives to appointment late as she mixed up her timings. We continued with balance and LE strengthening. Has the most difficulty with narrow walking on beam. Ended session with some strengthening for her UE as she has a rotator cuff problem that gives her some trouble. She tolerated session well and will benefit from ongoing balance, coordination, and strength activities.   OBJECTIVE IMPAIRMENTS cardiopulmonary status limiting activity, decreased activity tolerance, decreased balance, decreased cognition, decreased mobility, difficulty walking, decreased strength, and improper body mechanics.   ACTIVITY LIMITATIONS standing, squatting, stairs, transfers, bed mobility, locomotion level, and caring for others  PARTICIPATION LIMITATIONS: community activity, occupation, and yard work  PERSONAL FACTORS Age, Fitness, Past/current experiences, and Time since onset of injury/illness/exacerbation are also affecting patient's functional outcome.   REHAB POTENTIAL: Good  CLINICAL DECISION MAKING: Evolving/moderate complexity  EVALUATION COMPLEXITY: Moderate  PLAN: PT FREQUENCY: 2x/week  PT DURATION: 8 weeks  PLANNED INTERVENTIONS: Therapeutic exercises, Therapeutic activity, Neuromuscular re-education, Balance training, Gait training, Patient/Family education, Self Care, Joint mobilization, Stair training, Dry Needling, Cognitive remediation, Cryotherapy, Moist heat, Taping, Ultrasound, Ionotophoresis 72m/ml Dexamethasone, Manual therapy, and Re-evaluation  PLAN FOR NEXT SESSION: focus on functional activity tolerance, balance, strength    Kristen U PT DPT PN2  01/18/2022, 11:46 AM

## 2022-01-18 ENCOUNTER — Encounter: Payer: Self-pay | Admitting: Speech Pathology

## 2022-01-18 ENCOUNTER — Ambulatory Visit: Payer: BC Managed Care – PPO | Admitting: Speech Pathology

## 2022-01-18 ENCOUNTER — Ambulatory Visit: Payer: BC Managed Care – PPO

## 2022-01-18 DIAGNOSIS — R498 Other voice and resonance disorders: Secondary | ICD-10-CM

## 2022-01-18 DIAGNOSIS — R41841 Cognitive communication deficit: Secondary | ICD-10-CM

## 2022-01-18 DIAGNOSIS — M6281 Muscle weakness (generalized): Secondary | ICD-10-CM

## 2022-01-18 DIAGNOSIS — R278 Other lack of coordination: Secondary | ICD-10-CM

## 2022-01-18 DIAGNOSIS — R2681 Unsteadiness on feet: Secondary | ICD-10-CM

## 2022-01-18 NOTE — Patient Instructions (Signed)
   IEP meetings   Extra time to respond in IEP meetings - provide extra response time in conversations, meetings, conferences   Review the IEP's before hand and have accommodations  written out   Meetings in quiet rooms, have slides or notes provided before the meeting, see if some kind soul will take notes during IEP for you  If you have any questions or concerns you would like to discuss during this meeting - please let me know or please e mail me any questions or concerns so you have a heads up  Have a time to keep the accommodations - through June or August  Good idea to buddy up with the SLP or OT for Behavior specialist or back up  When you are confronted with an aggressive or upset parent or student you will need to keep yourself as calm as possible - if you get stressed, you will be extra distracted and have less focus and your words may get bottled up more  On instagram: [email or web address]                         Reflux Gourmet  For reflux: Alginate (Reflux Gourmet)  Coughing in the morning can be a sign of silent LPR  I would use an alginate or your PPI (reflux med)  Dr. Ashok Croon or Dr. Anda Kraft if you need an ENT in [facility]  If you get more hoarse, come back to ST in the summer

## 2022-01-18 NOTE — Therapy (Signed)
OUTPATIENT SPEECH LANGUAGE PATHOLOGY TREATMENT NOTE   Patient Name: Megan Reid MRN: [id number] DOB:Feb 04, 1966, 56 y.o., female Today's Date: 01/18/2022  PCP: Aleda Grana MD REFERRING PROVIDER: Gertie Gowda, DO   END OF SESSION:   End of Session - 01/18/22 1235     Visit Number 6    Number of Visits 17    Date for SLP Re-Evaluation 03/01/22    SLP Start Time 1233    SLP Stop Time  1315    SLP Time Calculation (min) 42 min    Activity Tolerance Patient tolerated treatment well             Past Medical History:  Diagnosis Date   Arthralgia 02/14/2016   Asthma    Autoimmune disease ([place]) 02/14/2016   Positive RF 139 CCP Negative  Positive ANA 1:640 Positive Ro Positive La Elevated ESR 105 - Sjorgen's Disease   DDD (degenerative disc disease), lumbar 02/14/2016   Fatigue 02/14/2016   Gastritis 02/14/2016   Hot flashes    Migraines 02/14/2016   Osteoarthritis of both hands 02/14/2016   Osteoarthritis of both knees 02/14/2016   Vitamin D deficiency 02/14/2016   Past Surgical History:  Procedure Laterality Date   BREAST BIOPSY Left    ROTATOR CUFF REPAIR Right 06/16/2019   TUBAL LIGATION     Patient Active Problem List   Diagnosis Date Noted   Anemia 12/20/2021   Abnormal LFTs 83/66/2947   Toxic metabolic encephalopathy 65/46/5035   Hepatitis 12/05/2021   Acute hepatitis 12/04/2021   Thrombocytopenia ([place]) 12/04/2021   Acute renal failure (HCC)    Seasonal and perennial allergic rhinitis 12/02/2020   Chronic coughing 12/02/2020   Sjogren's syndrome ([place]) 12/02/2020   Leukopenia 12/02/2020   Adverse reaction to food, subsequent encounter 06/23/2020   Heartburn 06/23/2020   Bruising 06/23/2020   Abrasion of right ankle 02/13/2017   Rheumatoid factor positive 09/01/2016   Leucopenia 09/01/2016   Autoimmune disease ([place]) 02/14/2016   Fatigue 02/14/2016   DDD (degenerative disc disease), lumbar 02/14/2016   Osteoarthritis of both knees  02/14/2016   Osteoarthritis of both hands 02/14/2016   Asthma 02/14/2016   Migraines 02/14/2016   Gastritis 02/14/2016   Vitamin D deficiency 02/14/2016   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 Yes; 8/10 left hip, hot/cold    OBJECTIVE:   TODAY'S TREATMENT:  01/18/22: Megan Reid reports carrying over strategies of keeping her keys on a hook, her laptop on the table and meds in the same spot in her bathroom and she has used self talk to make sure she doesn't set the items down in  a random spot. She has not lost items this past week. She is reading successfully and has been able to add in some background distractions and continues to recall details from multi paragraph articles. Today, she generated strategy for word finding and slow processing she may experience in IEP meetings, staff meetings and conferences to self advocate for extra response time, ask for repetitions and have a teacher partner take notes during [place] so she  can focus on auditory processing. Coughing episode after multiple sneezes, with usual mod A of verbal cues and modeling abdominal sniff-blows, Megan Reid reduced and the stopped coughing.   01/12/22: Megan Reid reports that she has used VCD breathing to reduce duration of VCD - he husband commented "That was over so quick" as she was successful using the abdominal sniff/blow. Trigger was the mint smell of her toothpaste. Instructed her to "pre-treat" the VCD using the abdominal breathing before she  encounters a trigger, with goal to eliminate cough. She demonstrated abdominal sniff blow with occasional min A for shoulders down and to relax neck and shoulders 3x. Instructed her to put a sign on her computer and in her class desk for shoulders down - she is working on this with OT as well. Megan Reid verbalizes carryover of strategies for attention and memory including reducing background noise, advocating for no interruptions, breaking tasks into smaller parts. She is aware of strategies and accommodations for attention and memory upon return to work.   01/10/22: Megan Reid reports success attending to and responding to 2 conversations at once, her family noted the success. She reports she successfully used abdominal sniff-blow to control a coughing episode on an airplane this past week. She continues to practice VCD breathing tx. We generated 2 activities to do at home to target multi tasking for return to classroom, including listening to podcasts with background noises and "working" on practice task on computer with TV. We generated strategy of using visuals to keep classroom noise to a minimum. 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 [name] verbalized 3 strategies to compensation for reduced attention in the classroom with rare min A.   9/21/223: Initiated training in compensatory strategies for attention, including external strategies of reducing back ground noise, getting important information in writing, repeating back what she has heard. Megan Reid generated strategy of taking notes during staff meetings then comparing her notes to the secretary's to ensure she has all of the information she needs. We collaborated to ID some strategies she can use in her classroom to reduce the need to multi task, including checking voice mails, emails and texts at set times rather than throughout the  day, self advocating to administration that she may need 10-15 minute breaks during her planning periods, and switching her after school duty to next semester if able. Chimene reports she has began to take notes on important conversations and information from HR, MD, insurance - I instructed her to read them back to the other person to ensure she has all details correct. During session, Megan Reid had coughing attack triggered by a smell. She reports this has been going on for 3 years, she is on reflux meds. 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  Briefly demonstrated breathing techniques for VCD and provided name of Dr. Chase Caller should she want to pursue work up for this.   PATIENT EDUCATION: Education details: compensations for attention and memory, accommodations for return to work, see Patient Instructions Person educated: Patient Education method: Explanation, Verbal cues, and Handouts Education comprehension: verbal cues required and needs further education         GOALS: Goals reviewed with patient? Yes   SHORT TERM GOALS: Target date: 02/01/2022   Pt will use compensations for attention and memory to report  50% reduction in loosing household items (subjectively) Baseline: Goal status: MET   2.  Pt will carryover 3 strategies to recall 4 details from paragraph over 2 sessions Baseline:  Goal status: MET   3.  Pt will carryover 2 strategies to recall pertinent information from work, insurance, healthcare providers over 1 week Baseline:  Goal status: ONGOING   4.  Pt will verbalize 3 accommodations she can request upon return to work Baseline:  Goal status: Goal Met       LONG TERM GOALS: Target date:03/01/2022   Pt will carryover 4 strategies for attention and memory to recall pertinent details from 2 paragraphs and from  phone calls/conversations over 2 sessions Baseline:  Goal status: ONGOING   2.  Pt will verbalize 4 accommodations she can use upon return to work with rare min A Baseline:  Goal status: MET   3.  Pt will improve score on cognitive function PROM -  Baseline: 67 Goal status: ONGOING  4. Pt will report 50% (subjectively) reduced coughing episodes.                Goal Status: ONGOING   ASSESSMENT:   CLINICAL IMPRESSION: Patient is a 56 y.o. female who was seen today for impairments in memory and attention. she presents with mild high level cognitive linguistic impairment. Ongoing training for compensatory strategies for attention/processing, memory. 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 She also has s/s of VCD/Inspiratory Laryngeal Obstruction. I recommend skilled ST to maximize cognition and train in compensatory strategies for cognitive communication impairments to maximize successful return to work. Added VCD goal.   OBJECTIVE IMPAIRMENTS include attention, memory, and expressive language. These impairments are limiting patient from return to work and effectively communicating at home and in community. Factors affecting potential to achieve goals and functional outcome are none.. Patient will benefit from skilled SLP services to address above impairments and improve overall function.   REHAB POTENTIAL: Good   PLAN: SLP FREQUENCY: 2x/week   SLP DURATION: 8 weeks   PLANNED INTERVENTIONS: Language facilitation, Environmental controls, Cueing hierachy, Cognitive reorganization, and Internal/external aids         [place], Annye Rusk, CCC-SLP 01/18/2022, 1:11 PM

## 2022-01-19 ENCOUNTER — Ambulatory Visit: Payer: BC Managed Care – PPO

## 2022-01-20 NOTE — Therapy (Signed)
OUTPATIENT PHYSICAL THERAPY NEURO TREATMENT   Patient Name: Megan Reid MRN: [id number] DOB:1965-08-16, 56 y.o., female Today's Date: 01/23/2022   PCP: Megan Reid  REFERRING PROVIDER: Gertie Gowda, DO    PT End of Session - 01/23/22 1315     Visit Number 4    PT Start Time 8563    PT Stop Time 1400    PT Time Calculation (min) 45 min    Activity Tolerance Patient tolerated treatment well;Patient limited by fatigue    Behavior During Therapy [facility] for tasks assessed/performed                Past Medical History:  Diagnosis Date   Arthralgia 02/14/2016   Asthma    Autoimmune disease ([place]) 02/14/2016   Positive RF 139 CCP Negative  Positive ANA 1:640 Positive Ro Positive La Elevated ESR 105 - Sjorgen's Disease   DDD (degenerative disc disease), lumbar 02/14/2016   Fatigue 02/14/2016   Gastritis 02/14/2016   Hot flashes    Migraines 02/14/2016   Osteoarthritis of both hands 02/14/2016   Osteoarthritis of both knees 02/14/2016   Vitamin D deficiency 02/14/2016   Past Surgical History:  Procedure Laterality Date   BREAST BIOPSY Left    ROTATOR CUFF REPAIR Right 06/16/2019   TUBAL LIGATION     Patient Active Problem List   Diagnosis Date Noted   Anemia 12/20/2021   Abnormal LFTs 14/97/0263   Toxic metabolic encephalopathy 78/58/8502   Hepatitis 12/05/2021   Acute hepatitis 12/04/2021   Thrombocytopenia ([place]) 12/04/2021   Acute renal failure (HCC)    Seasonal and perennial allergic rhinitis 12/02/2020   Chronic coughing 12/02/2020   Sjogren's syndrome ([place]) 12/02/2020   Leukopenia 12/02/2020   Adverse reaction to food, subsequent encounter 06/23/2020   Heartburn 06/23/2020   Bruising 06/23/2020   Abrasion of right ankle 02/13/2017   Rheumatoid factor positive 09/01/2016   Leucopenia 09/01/2016   Autoimmune disease ([place]) 02/14/2016   Fatigue 02/14/2016   DDD (degenerative disc disease), lumbar 02/14/2016   Osteoarthritis of both knees  02/14/2016   Osteoarthritis of both hands 02/14/2016   Asthma 02/14/2016   Migraines 02/14/2016   Gastritis 02/14/2016   Vitamin D deficiency 02/14/2016   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   Pt accompanied by: self  PERTINENT HISTORY: Brief HPI:   Megan Reid is a 56 y.o. female with history of DDD lumbar spine, asthma, autoimmune disease, Sjogren's with recent onset of trigeminal neuralgia and was started on Tegretol.  She was also reported to have been taking ibuprofen as well as Tylenol for pain management.  She was found to have metabolic encephalopathy with acute hepatic and renal failure.  Renal failure treated with IV fluids and bicarb .  AKI felt to be due to ATN from acute illness, diarrhea and ibuprofen use.  She developed fever past admission and was treated with IV antibiotics due to concerns of sepsis.  Work-up was negative for DR ESS, tickborne illness, CMV, EBV and acute hepatitis/pancytopenia felt to be secondary to Tegretol toxicity.  She did not have pancytopenia and supportive care was recommended by heme-onc.  Renal status and abnormal LFTs were resolving however patient continued limited by weakness, DOE as well as encephalopathy with delayed processing.   CIR was recommended due to functional decline.     Hospital Course: Megan Reid was admitted to rehab 12/12/2021 for inpatient therapies to consist of PT, ST and OT at least three hours five days a week. Past admission physiatrist, therapy team and rehab RN have worked together to provide customized collaborative inpatient rehab.  Her blood pressures were monitored on TID basis and has been stable.  Follow-up check of electrolytes revealed renal status as well as abnormal LFTs to be gradually improving.  She was found to have drop in magnesium to 1.3 requiring IV supplementation as well as p.o. supplement.  Repeat check showed improvement with magnesium of 2.0.  Recommend repeat check in 1 to 2 weeks to monitor for stability as well as need to continue oral supplementation.   Follow-up CBC showed improvement in white count with stable H&H and that thrombocytopenia had resolved.  Her alertness and mentation has gradually improved with improvement in activity tolerance and respiratory status.  Hydroxyzine 10 mg 3 times daily as needed was added to help manage anxiety.  Sleep-wake disruption is improved with addition of melatonin.  Constipation has resolved with augmentation of bowel regimen.  She has made steady gains during her rehab stay and is modified independent at discharge.  Supervision is recommended with cognitive task and for safety.  She will continue to receive follow-up outpatient PT, OT and ST at [facility] outpatient rehab after discharge.     Rehab course: During patient's stay in rehab weekly team conferences were held to monitor patient's progress, set goals and discuss barriers to discharge. At admission, patient required min assist with mobility and with ADL tasks.  She exhibited mild cognitive deficits affecting recall, memory and executive function with SLUMS score 22/30. She  has had improvement in activity tolerance, balance, postural control as well as ability to compensate for  deficits. She is able to complete ADL tasks at modified independent level. She is independent for transfers and is able to ambulate 150' without AD. She is able to climb 12 stairs independently. She requires supervision with functional and complex cognitive tasks. Her SLUMS score has improved to 26/30. Family education has been completed.    Disposition: Home  PAIN:  Are you having pain? No  PRECAUTIONS: Fall  WEIGHT BEARING RESTRICTIONS No  FALLS: Has patient fallen in last 6 months? Yes. Number  of falls 1  LIVING ENVIRONMENT: Lives with: lives with their spouse Lives in: House/apartment Stairs: 4 STE B rails, home is handicap accessible except for bathtub  Has following equipment at home: None  PLOF: Independent and Independent with basic ADLs  PATIENT GOALS build strength and balance, endurance   OBJECTIVE:   DIAGNOSTIC FINDINGS:   COGNITION: Overall cognitive status: Impaired- memory issues after acute medical problem    SENSATION: Not tested   LOWER EXTREMITY ROM:     Active  Right Eval Left Eval  Hip flexion    Hip extension    Hip abduction    Hip adduction    Hip internal rotation    Hip external rotation    Knee flexion    Knee extension    Ankle dorsiflexion    Ankle plantarflexion    Ankle inversion    Ankle eversion     (Blank rows = not tested)  LOWER EXTREMITY MMT:    MMT Right Eval Left Eval  Hip flexion 3 3  Hip extension 2 2  Hip abduction 3 4-  Hip adduction    Hip internal rotation    Hip external rotation    Knee flexion 4 4  Knee extension 4 4  Ankle dorsiflexion 3 3  Ankle plantarflexion    Ankle inversion    Ankle eversion    (Blank rows = not tested)  BED MOBILITY:  Sit to supine Complete Independence Supine to sit Complete Independence Rolling to Right Complete Independence Rolling to Left Complete Independence  TRANSFERS: Assistive device utilized: None  Sit to stand: Complete Independence Stand to sit:  Complete Independence Chair to chair: Complete Independence Floor:    GAIT: Gait pattern: WFL and trendelenburg Distance walked: 610f  Assistive device utilized: None Level of assistance: Complete Independence Comments: WNL   FUNCTIONAL TESTs:  Berg Balance Scale: 42/56 2MWT 6721f  TODAY'S TREATMENT:  01/23/22 Bike L4 x5m3m  Walking outdoors  Lateral step ups 6" Shoulder ext 10# 2x10 Cable rows 10# 2x10 Standing on airex hitting ball feet together, half tandem  Leg ext 5# 2x10 HS curls 25# 2x10 STS on airex 2x10   01/18/22 NuStep L6 x5mi97m Walking on beam narrow steps and side steps  Side steps on airex  Lateral band walks greenTB  Calf raises on black bar 2x10 Leg press 20# x10, 30# x10 Shoulder rows and ext greenTB 2x10  01/16/22 NuStep L5 x6min60mridges 2x10 SLR 2# 2x10 STS w/OHP x10, x10 with push out  Step ups 6"  Leg ext 5# 2x10 HS curls 25# 2x10  Resisted gait 20# 4 way x4   01/12/22 Nustep L4 x6 minutes  Bridges x5  Sidelying hip ABD x5 B   PATIENT EDUCATION: Education details: exam findings, POC, HEP  Person educated: Patient Education method: ExplaConsulting civil engineeronstration, and Handouts Education comprehension: verbalized understanding and returned demonstration   HOME EXERCISE PROGRAM: [id number]    GOALS: Goals reviewed with patient? Yes  SHORT TERM GOALS: Target date: 01/30/2022  Will be compliant with appropriate progressive HEP  Baseline: Goal status: INITIAL  2.  Will be able to name at least 3 ways to reduce fall risk at home and in community  Baseline:  Goal status: INITIAL  3.  Will tolerate standing tasks for at least 90 minutes without increase in fatigue  Baseline:  Goal status: INITIAL  4.  Will be able to walk for at least 15 minutes without rest to show improved functional activity tolerance  Baseline:  Goal status: INITIAL    LONG TERM GOALS: Target date: 02/27/2022  MMT to improve by at least 1 grade in all  weak groups  Baseline:  Goal status: INITIAL  2.  Will score at least 50 on the Berg to show improved functional balance  Baseline:  Goal status: INITIAL  3.  Will tolerate at least 1/2 day at work without fatigue to show improved functional activity tolerance  Baseline:  Goal status: INITIAL  4.  Will be independent with appropriate gym based exercise program to maintain functional gains and continue progress on an independent basis  Baseline:  Goal status: INITIAL   ASSESSMENT:  CLINICAL IMPRESSION: Patient is doing well overall, states she feels 85% back to normal but has concerns of fatiguing easily. She was winded after our walk today and states that even with household chores she feels very tired. 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 Tolerates session well with rest breaks throughout due to fatigue.   OBJECTIVE IMPAIRMENTS cardiopulmonary status limiting activity, decreased activity tolerance, decreased balance, decreased cognition, decreased mobility, difficulty walking, decreased strength, and improper body mechanics.   ACTIVITY LIMITATIONS standing, squatting, stairs, transfers, bed mobility, locomotion level, and caring for others  PARTICIPATION LIMITATIONS: community activity, occupation, and yard work  PERSONAL FACTORS Age, Fitness, Past/current experiences, and Time since onset of injury/illness/exacerbation are also affecting patient's functional outcome.   REHAB POTENTIAL: Good  CLINICAL DECISION MAKING: Evolving/moderate complexity  EVALUATION COMPLEXITY: Moderate  PLAN: PT FREQUENCY: 2x/week  PT DURATION: 8 weeks  PLANNED INTERVENTIONS: Therapeutic exercises, Therapeutic activity, Neuromuscular re-education, Balance training, Gait training, Patient/Family education, Self Care, Joint mobilization, Stair  training, Dry Needling, Cognitive remediation, Cryotherapy, Moist heat, Taping, Ultrasound, Ionotophoresis 52m/ml Dexamethasone, Manual therapy, and Re-evaluation  PLAN FOR NEXT SESSION: resisted side steps, tandem catch, step up on [employer] DPT  01/23/2022, 1:57 PM

## 2022-01-23 ENCOUNTER — Ambulatory Visit: Payer: BC Managed Care – PPO | Admitting: Occupational Therapy

## 2022-01-23 ENCOUNTER — Ambulatory Visit: Payer: BC Managed Care – PPO

## 2022-01-23 DIAGNOSIS — G8929 Other chronic pain: Secondary | ICD-10-CM

## 2022-01-23 DIAGNOSIS — M545 Low back pain, unspecified: Secondary | ICD-10-CM

## 2022-01-23 DIAGNOSIS — M6281 Muscle weakness (generalized): Secondary | ICD-10-CM | POA: Diagnosis not present

## 2022-01-23 DIAGNOSIS — R278 Other lack of coordination: Secondary | ICD-10-CM

## 2022-01-23 DIAGNOSIS — R2681 Unsteadiness on feet: Secondary | ICD-10-CM

## 2022-01-23 NOTE — Therapy (Signed)
OUTPATIENT OCCUPATIONAL THERAPY TREATMENT NOTE  Patient Name: Megan Reid MRN: [id number] DOB:02/04/1966, 56 y.o., female Today's Date: 01/23/2022  PCP: Varadarajan, Rupashree, MD REFERRING PROVIDER: Engler, Morgan C, DO   OT End of Session - 01/23/22 1359     Visit Number 4    Number of Visits 7    Date for OT Re-Evaluation 02/10/22    Authorization Type [employer]    Authorization Time Period VL: 40    OT Start Time 1400    OT Stop Time 1440    OT Time Calculation (min) 40 min    Activity Tolerance Patient tolerated treatment well    Behavior During Therapy WFL for tasks assessed/performed            Past Medical History:  Diagnosis Date   Arthralgia 02/14/2016   Asthma    Autoimmune disease (HCC) 02/14/2016   Positive RF 139 CCP Negative  Positive ANA 1:640 Positive Ro Positive La Elevated ESR 105 - Sjorgen's Disease   DDD (degenerative disc disease), lumbar 02/14/2016   Fatigue 02/14/2016   Gastritis 02/14/2016   Hot flashes    Migraines 02/14/2016   Osteoarthritis of both hands 02/14/2016   Osteoarthritis of both knees 02/14/2016   Vitamin D deficiency 02/14/2016   Past Surgical History:  Procedure Laterality Date   BREAST BIOPSY Left    ROTATOR CUFF REPAIR Right 06/16/2019   TUBAL LIGATION     Patient Active Problem List   Diagnosis Date Noted   Anemia 12/20/2021   Abnormal LFTs 12/20/2021   Toxic metabolic encephalopathy 12/12/2021   Hepatitis 12/05/2021   Acute hepatitis 12/04/2021   Thrombocytopenia (HCC) 12/04/2021   Acute renal failure (HCC)    Seasonal and perennial allergic rhinitis 12/02/2020   Chronic coughing 12/02/2020   Sjogren's syndrome (HCC) 12/02/2020   Leukopenia 12/02/2020   Adverse reaction to food, subsequent encounter 06/23/2020   Heartburn 06/23/2020   Bruising 06/23/2020   Abrasion of right ankle 02/13/2017   Rheumatoid factor positive 09/01/2016   Leucopenia 09/01/2016   Autoimmune disease (HCC) 02/14/2016   Fatigue  02/14/2016   DDD (degenerative disc disease), lumbar 02/14/2016   Osteoarthritis of both knees 02/14/2016   Osteoarthritis of both hands 02/14/2016   Asthma 02/14/2016   Migraines 02/14/2016   Gastritis 02/14/2016   Vitamin D deficiency 02/14/2016   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HCC) 02/14/2016   High risk medication use 02/14/2016    ONSET DATE: 12/21/2021 (date of OT order)  REFERRING DIAG: G92.8 (ICD-10-CM) - Toxic metabolic encephalopathy  THERAPY DIAG:  Muscle weakness (generalized)  Other lack of coordination  Rationale for Evaluation and Treatment Rehabilitation  SUBJECTIVE:   SUBJECTIVE STATEMENT: Pt reports she is worn out from PT Pt accompanied by: self  PERTINENT HISTORY: Megan Reid is a 56 y.o. female with history of DDD lumbar spine, asthma, autoimmune disease, Sjogren's with recent onset of trigeminal neuralgia and was started on Tegretol. She was also reported to have been taking ibuprofen as well as Tylenol for pain management. She was found to have metabolic encephalopathy with acute hepatic and renal failure. Renal failure treated with IV fluids and bicarb. AKI felt to be due to ATN from acute illness, diarrhea and ibuprofen use. She developed fever past admission and was treated with IV antibiotics due to concerns of sepsis. Work-up was negative for DR ESS, tickborne illness, CMV, EBV and acute hepatitis/pancytopenia felt to be secondary to Tegretol toxicity. She did not have pancytopenia and supportive care was recommended by   heme-onc. Renal status and abnormal LFTs were resolving however patient continued limited by weakness, DOE as well as encephalopathy with delayed processing. CIR was recommended due to functional decline.  PRECAUTIONS: Fall; no lifting more than 15 lbs, care w/ strenuous activity  PAIN: Are you having pain? No  PLOF: Independent, Vocation/Vocational requirements: high Education officer, museum (full-time), and Leisure:  running  PATIENT GOALS: Get back to running; return to "everyday living;" improve reaching w/ RUE   OBJECTIVE:   HAND DOMINANCE: Right  UPPER EXTREMITY ROM     Active ROM Right Eval - 9/19 Right 10/2 Right 10/9  Shoulder flexion 69* 106* 124*  Shoulder abduction 72*    Shoulder adduction     Shoulder extension     Shoulder internal rotation     Shoulder external rotation 56*    Elbow flexion WFL    Elbow extension     (Blank rows = not tested)   TODAY'S TREATMENT:  Therapeutic Exercise Towel slides on tabletop w/ BUEs for shoulder flex to facilitate increased ROM and stretch; OT provided initial verbal cues to decr compensatory shoulder hiking  Closed-chain AAROM of RUE holding short unweighted dowel w/ both hands while supine. OT provided initial/fading verbal and tactile cues for technique/preventing trunk extension.  AROM of R shoulder ER completed 2x10 in supine w/ OT providing tactile/visual cue for consistency w/ arc of motion each rep  Closed-chain AAROM of RUE abduction completed 2x10 w/ unweighted dowel while supine; able to achieve slightly incr arc of motion w/ incr reps  Closed-chain strengthening of shoulder flexion w/ 3 lb dowel rod 2x10. OT provided verbal cues to decrease compensatory trunk movements, particularly at end range and when extending back to starting position    PATIENT EDUCATION: Ongoing condition-specific education related to therapeutic interventions completed this session. Education also provided on progress toward functional goals, interpretation of changes in objective measurements, and review of considerations for return-to-work discussed in previous session Person educated: Patient Education method: Explanation Education comprehension: verbalized understanding   HOME EXERCISE PROGRAM: Coordination activities   GOALS: Goals reviewed with patient? 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  Met - 01/12/22  2 Pt will demonstrate independence w/ HEP designed for RUE strength, endurance, and coordination  Progressing     LONG TERM GOALS: Target date: 02/10/22    Status:  1 Pt will improve efficiency during functional fine motor skills (e.g., buttons, ties, handwriting) by improving time to complete 9-HPT w/ dominant R hand to at least 25.5 sec  Baseline: Right: 26.18 sec; Left: 25.49 sec Met - 01/23/22; 19.8 sec  2 Pt will improve ROM of R shoulder flexion to at least 110 degrees w/out pain to improve participation in work-related tasks (e.g., writing on whiteboard for class)  Baseline: 69 deg R shoulder flexion Met - 01/16/22: 106 deg     ASSESSMENT:  CLINICAL IMPRESSION: Due to pt's report of improved participation in all functional activities, OT reassessed progress toward goals w/ pt now having met both LTGs for coordination and R shoulder ROM, demonstrating 124 deg of shoulder flexion which is WFL. OT continued to address R shoulder, focusing on endurance, decreasing compensatory movements, and light strengthening in supine for scapular support and stability. Pt able to return demo of exercises w/ occ verbal/tactile cues for  technique. Pt to be put on hold for about 2 weeks, until after returning to work on the 23rd to allow time to focus on HEP independently and to address concerns related work tasks if they arise.  Pt to be discharged from OP OT if no further therapy is warranted by that October 27th.  PERFORMANCE DEFICITS in functional skills including ROM, strength, mobility, balance, body mechanics, endurance, and UE functional use, cognitive skills including memory.  IMPAIRMENTS are limiting patient from IADLs and work.   COMORBIDITIES may have co-morbidities  that affects occupational performance.  Patient will benefit from skilled OT to address above impairments and improve overall function.   PLAN: OT FREQUENCY: 1x/week  OT DURATION: 6 weeks  PLANNED INTERVENTIONS: self care/ADL training, therapeutic exercise, therapeutic activity, manual therapy, functional mobility training, aquatic therapy, moist heat, cryotherapy, patient/family education, energy conservation, and DME and/or AE instructions  RECOMMENDED OTHER SERVICES: Currently receiving PT and SLP services  CONSULTED AND AGREED WITH PLAN OF CARE: Patient  PLAN FOR NEXT SESSION: Discuss/address any concern related to return-to-work; review and finalize HEP for R shoulder ROM and endurance    , MSOT, OTR/L 01/23/2022, 2:59 PM 

## 2022-01-24 ENCOUNTER — Ambulatory Visit: Payer: BC Managed Care – PPO

## 2022-01-24 NOTE — Progress Notes (Unsigned)
Megan Reid is a 56 y.o. female with history of DDD lumbar spine, asthma, autoimmune disease, Sjogren's with recent onset of trigeminal neuralgia and was started on Tegretol, who presents as f/u after [facility] admission 2/95-7/4 for metabolic encephalopathy d/t Tegretol rxn.  Plan from last visit 9/6:   Toxic metabolic encephalopathy D/t Tegretol drug reaction SLP, PT, and OT therapy orders re-submitted Doing extremely well otherwise from a rehab perspective. Will follow up around 10/13 to re-assess need for work restrictions/accomodations prior to return date 10/20. Other post infection and related fatigue syndromes Continue adaptive strategies to avoid over-exertion. Can continue melatonin PRN for sleep - overall doing much better.  Follow up PRN if any worsening or concerning symptoms.    Interval Hx:

## 2022-01-25 ENCOUNTER — Ambulatory Visit: Payer: BC Managed Care – PPO | Admitting: Speech Pathology

## 2022-01-25 ENCOUNTER — Encounter: Payer: Self-pay | Admitting: Physical Medicine and Rehabilitation

## 2022-01-25 ENCOUNTER — Ambulatory Visit: Payer: BC Managed Care – PPO

## 2022-01-25 ENCOUNTER — Encounter: Payer: Self-pay | Admitting: Speech Pathology

## 2022-01-25 ENCOUNTER — Encounter
Payer: BC Managed Care – PPO | Attending: Physical Medicine and Rehabilitation | Admitting: Physical Medicine and Rehabilitation

## 2022-01-25 VITALS — BP 130/85 | HR 80 | Ht 70.0 in | Wt 162.6 lb

## 2022-01-25 DIAGNOSIS — R2681 Unsteadiness on feet: Secondary | ICD-10-CM

## 2022-01-25 DIAGNOSIS — G3184 Mild cognitive impairment, so stated: Secondary | ICD-10-CM

## 2022-01-25 DIAGNOSIS — M6281 Muscle weakness (generalized): Secondary | ICD-10-CM | POA: Diagnosis not present

## 2022-01-25 DIAGNOSIS — G479 Sleep disorder, unspecified: Secondary | ICD-10-CM | POA: Diagnosis present

## 2022-01-25 DIAGNOSIS — R278 Other lack of coordination: Secondary | ICD-10-CM

## 2022-01-25 DIAGNOSIS — R498 Other voice and resonance disorders: Secondary | ICD-10-CM

## 2022-01-25 DIAGNOSIS — G928 Other toxic encephalopathy: Secondary | ICD-10-CM | POA: Diagnosis present

## 2022-01-25 DIAGNOSIS — K9049 Malabsorption due to intolerance, not elsewhere classified: Secondary | ICD-10-CM | POA: Diagnosis present

## 2022-01-25 DIAGNOSIS — Z7409 Other reduced mobility: Secondary | ICD-10-CM

## 2022-01-25 DIAGNOSIS — R202 Paresthesia of skin: Secondary | ICD-10-CM

## 2022-01-25 DIAGNOSIS — R41841 Cognitive communication deficit: Secondary | ICD-10-CM

## 2022-01-25 DIAGNOSIS — M545 Low back pain, unspecified: Secondary | ICD-10-CM

## 2022-01-25 NOTE — Therapy (Signed)
OUTPATIENT PHYSICAL THERAPY NEURO TREATMENT   Patient Name: Megan Reid MRN: [id number] DOB:25-Jun-1965, 56 y.o., female Today's Date: 01/25/2022   PCP: Leeroy Cha  REFERRING PROVIDER: Gertie Gowda, DO         Past Medical History:  Diagnosis Date   Arthralgia 02/14/2016   Asthma    Autoimmune disease ([place]) 02/14/2016   Positive RF 139 CCP Negative  Positive ANA 1:640 Positive Ro Positive La Elevated ESR 105 - Sjorgen's Disease   DDD (degenerative disc disease), lumbar 02/14/2016   Fatigue 02/14/2016   Gastritis 02/14/2016   Hot flashes    Migraines 02/14/2016   Osteoarthritis of both hands 02/14/2016   Osteoarthritis of both knees 02/14/2016   Vitamin D deficiency 02/14/2016   Past Surgical History:  Procedure Laterality Date   BREAST BIOPSY Left    ROTATOR CUFF REPAIR Right 06/16/2019   TUBAL LIGATION     Patient Active Problem List   Diagnosis Date Noted   Decreased mobility and endurance 01/25/2022   Difficulty sleeping 01/25/2022   Dairy product intolerance 01/25/2022   Mild cognitive impairment 01/25/2022   Anemia 12/20/2021   Abnormal LFTs 08/65/7846   Toxic metabolic encephalopathy 96/29/5284   Thrombocytopenia ([place]) 12/04/2021   Seasonal and perennial allergic rhinitis 12/02/2020   Chronic coughing 12/02/2020   Sjogren's syndrome ([place]) 12/02/2020   Leukopenia 12/02/2020   Adverse reaction to food, subsequent encounter 06/23/2020   Heartburn 06/23/2020   Bruising 06/23/2020   Abrasion of right ankle 02/13/2017   Rheumatoid factor positive 09/01/2016   Leucopenia 09/01/2016   Autoimmune disease ([place]) 02/14/2016   Fatigue 02/14/2016   DDD (degenerative disc disease), lumbar 02/14/2016   Osteoarthritis of both knees 02/14/2016   Osteoarthritis of both hands 02/14/2016   Asthma 02/14/2016   Migraines 02/14/2016   Gastritis 02/14/2016   Vitamin D deficiency 02/14/2016   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[place])  02/14/2016   High risk medication use 02/14/2016    ONSET DATE: 12/21/2021   REFERRING DIAG: G92.8 ([id number]) - Toxic metabolic encephalopathy   THERAPY DIAG:  No diagnosis found.  Rationale for Evaluation and Treatment Rehabilitation  SUBJECTIVE:                                                                                                                                                                                              SUBJECTIVE STATEMENT: I am doing fine, I was a little sore after last time but I really liked the work out.   Pt accompanied by: self  PERTINENT HISTORY: Brief HPI:   Megan Reid is a 56 y.o. female with history  of DDD lumbar spine, asthma, autoimmune disease, Sjogren's with recent onset of trigeminal neuralgia and was started on Tegretol.  She was also reported to have been taking ibuprofen as well as Tylenol for pain management.  She was found to have metabolic encephalopathy with acute hepatic and renal failure.  Renal failure treated with IV fluids and bicarb .  AKI felt to be due to ATN from acute illness, diarrhea and ibuprofen use.  She developed fever past admission and was treated with IV antibiotics due to concerns of sepsis.  Work-up was negative for DR ESS, tickborne illness, CMV, EBV and acute hepatitis/pancytopenia felt to be secondary to Tegretol toxicity.  She did not have pancytopenia and supportive care was recommended by heme-onc.  Renal status and abnormal LFTs were resolving however patient continued limited by weakness, DOE as well as encephalopathy with delayed processing.  CIR was recommended due to functional decline.     Hospital Course: DRISANA SCHWEICKERT was admitted to rehab 12/12/2021 for inpatient therapies to consist of PT, ST and OT at least three hours five days a week. Past admission physiatrist, therapy team and rehab RN have worked together to provide customized collaborative inpatient rehab.  Her blood pressures were  monitored on TID basis and has been stable.  Follow-up check of electrolytes revealed renal status as well as abnormal LFTs to be gradually improving.  She was found to have drop in magnesium to 1.3 requiring IV supplementation as well as p.o. supplement.  Repeat check showed improvement with magnesium of 2.0.  Recommend repeat check in 1 to 2 weeks to monitor for stability as well as need to continue oral supplementation.   Follow-up CBC showed improvement in white count with stable H&H and that thrombocytopenia had resolved.  Her alertness and mentation has gradually improved with improvement in activity tolerance and respiratory status.  Hydroxyzine 10 mg 3 times daily as needed was added to help manage anxiety.  Sleep-wake disruption is improved with addition of melatonin.  Constipation has resolved with augmentation of bowel regimen.  She has made steady gains during her rehab stay and is modified independent at discharge.  Supervision is recommended with cognitive task and for safety.  She will continue to receive follow-up outpatient PT, OT and ST at [facility] outpatient rehab after discharge.     Rehab course: During patient's stay in rehab weekly team conferences were held to monitor patient's progress, set goals and discuss barriers to discharge. At admission, patient required min assist with mobility and with ADL tasks.  She exhibited mild cognitive deficits affecting recall, memory and executive function with SLUMS score 22/30. She  has had improvement in activity tolerance, balance, postural control as well as ability to compensate for deficits. She is able to complete ADL tasks at modified independent level. She is independent for transfers and is able to ambulate 150' without AD. She is able to climb 12 stairs independently. She requires supervision with functional and complex cognitive tasks. Her SLUMS score has improved to 26/30. Family education has been completed.    Disposition:  Home  PAIN:  Are you having pain? No  PRECAUTIONS: Fall  WEIGHT BEARING RESTRICTIONS No  FALLS: Has patient fallen in last 6 months? Yes. Number of falls 1  LIVING ENVIRONMENT: Lives with: lives with their spouse Lives in: House/apartment Stairs: 4 STE B rails, home is handicap accessible except for bathtub  Has following equipment at home: None  PLOF: Independent and Independent with basic ADLs  PATIENT  GOALS build strength and balance, endurance   OBJECTIVE:   DIAGNOSTIC FINDINGS:   COGNITION: Overall cognitive status: Impaired- memory issues after acute medical problem    SENSATION: Not tested   LOWER EXTREMITY ROM:     Active  Right Eval Left Eval  Hip flexion    Hip extension    Hip abduction    Hip adduction    Hip internal rotation    Hip external rotation    Knee flexion    Knee extension    Ankle dorsiflexion    Ankle plantarflexion    Ankle inversion    Ankle eversion     (Blank rows = not tested)  LOWER EXTREMITY MMT:    MMT Right Eval Left Eval  Hip flexion 3 3  Hip extension 2 2  Hip abduction 3 4-  Hip adduction    Hip internal rotation    Hip external rotation    Knee flexion 4 4  Knee extension 4 4  Ankle dorsiflexion 3 3  Ankle plantarflexion    Ankle inversion    Ankle eversion    (Blank rows = not tested)  BED MOBILITY:  Sit to supine Complete Independence Supine to sit Complete Independence Rolling to Right Complete Independence Rolling to Left Complete Independence  TRANSFERS: Assistive device utilized: None  Sit to stand: Complete Independence Stand to sit: Complete Independence Chair to chair: Complete Independence Floor:    GAIT: Gait pattern: WFL and trendelenburg Distance walked: 671f  Assistive device utilized: None Level of assistance: Complete Independence Comments: WNL   FUNCTIONAL TESTs:  Berg Balance Scale: 42/56 2MWT 672f  TODAY'S TREATMENT:  01/25/22 NuStep L5 x6m61m  Resisted side  steps over obstacles 5x each way  Tandem on airex catch Step up on BOSU  Leg press 40# 2x10 Calf raises on leg press 2x12 Shoulder ext 10# 2x10  01/23/22 Bike L4 x5mi70m Walking outdoors  Lateral step ups 6" Shoulder ext 10# 2x10 Cable rows 10# 2x10 Standing on airex hitting ball feet together, half tandem  Leg ext 5# 2x10 HS curls 25# 2x10 STS on airex 2x10   01/18/22 NuStep L6 x5min75mWalking on beam narrow steps and side steps  Side steps on airex  Lateral band walks greenTB  Calf raises on black bar 2x10 Leg press 20# x10, 30# x10 Shoulder rows and ext greenTB 2x10  01/16/22 NuStep L5 x6mins75midges 2x10 SLR 2# 2x10 STS w/OHP x10, x10 with push out  Step ups 6"  Leg ext 5# 2x10 HS curls 25# 2x10  Resisted gait 20# 4 way x4   01/12/22 Nustep L4 x6 minutes  Bridges x5  Sidelying hip ABD x5 B   PATIENT EDUCATION: Education details: exam findings, POC, HEP  Person educated: Patient Education method: ExplanConsulting civil engineernsMedia plannerHandouts Education comprehension: verbalized understanding and returned demonstration   HOME EXERCISE PROGRAM: [id number]    GOALS: Goals reviewed with patient? Yes  SHORT TERM GOALS: Target date: 01/30/2022  Will be compliant with appropriate progressive HEP  Baseline: Goal status: INITIAL  2.  Will be able to name at least 3 ways to reduce fall risk at home and in community  Baseline:  Goal status: INITIAL  3.  Will tolerate standing tasks for at least 90 minutes without increase in fatigue  Baseline:  Goal status: INITIAL  4.  Will be able to walk for at least 15 minutes without rest to show improved functional activity tolerance  Baseline:  Goal status: INITIAL  LONG TERM GOALS: Target date: 02/27/2022  MMT to improve by at least 1 grade in all weak groups  Baseline:  Goal status: INITIAL  2.  Will score at least 50 on the Berg to show improved functional balance  Baseline:  Goal status: INITIAL  3.   Will tolerate at least 1/2 day at work without fatigue to show improved functional activity tolerance  Baseline:  Goal status: INITIAL  4.  Will be independent with appropriate gym based exercise program to maintain functional gains and continue progress on an independent basis  Baseline:  Goal status: INITIAL   ASSESSMENT:  CLINICAL IMPRESSION: Patient is doing well, has some difficulty with resisted side steps especially pushing off of left leg. Has trouble with tandem standing on airex and step ups on BOSU, could benefit from with ongoing balance, strength, and coordination.   OBJECTIVE IMPAIRMENTS cardiopulmonary status limiting activity, decreased activity tolerance, decreased balance, decreased cognition, decreased mobility, difficulty walking, decreased strength, and improper body mechanics.   ACTIVITY LIMITATIONS standing, squatting, stairs, transfers, bed mobility, locomotion level, and caring for others  PARTICIPATION LIMITATIONS: community activity, occupation, and yard work  PERSONAL FACTORS Age, Fitness, Past/current experiences, and Time since onset of injury/illness/exacerbation are also affecting patient's functional outcome.   REHAB POTENTIAL: Good  CLINICAL DECISION MAKING: Evolving/moderate complexity  EVALUATION COMPLEXITY: Moderate  PLAN: PT FREQUENCY: 2x/week  PT DURATION: 8 weeks  PLANNED INTERVENTIONS: Therapeutic exercises, Therapeutic activity, Neuromuscular re-education, Balance training, Gait training, Patient/Family education, Self Care, Joint mobilization, Stair training, Dry Needling, Cognitive remediation, Cryotherapy, Moist heat, Taping, Ultrasound, Ionotophoresis 83m/ml Dexamethasone, Manual therapy, and Re-evaluation  PLAN FOR NEXT SESSION: mini squats on BOSU, leg ext and curls more on LLE, ladder drill   MAndris Baumann DPT  01/25/2022, 1:16 PM

## 2022-01-25 NOTE — Progress Notes (Signed)
Subjective:    Patient ID: Megan Reid, female    DOB: 1965-10-24, 56 y.o.   MRN: [id number]  HPI Megan Reid is a 56 y.o. female with history of DDD lumbar spine, asthma, autoimmune disease, Sjogren's with recent onset of trigeminal neuralgia and was started on Tegretol, who presents as f/u after IPR admission 8/88-9/1 for metabolic encephalopathy d/t Tegretol rxn.  Plan from last visit 9/6:   Toxic metabolic encephalopathy D/t Tegretol drug reaction SLP, PT, and OT therapy orders re-submitted Doing extremely well otherwise from a rehab perspective. Will follow up around 10/13 to re-assess need for work restrictions/accomodations prior to return date 10/20. Other post infection and related fatigue syndromes Continue adaptive strategies to avoid over-exertion. Can continue melatonin PRN for sleep - overall doing much better.  Follow up PRN if any worsening or concerning symptoms.    Interval Hx:  - Overall continuing to improve with OP therapies, does have ongoing issues with cognitive fatigue, vocal hoarseness, anxiety, physical endurance, and balance  - Regarding return to work, pt endorses she is ok keeping the 10/20 date with some accomidations. She has discussed these with her therapy team (SLP, PT, OT) who have been kind enough to provide some recommendations.   - Her typical work environment includes the following areas of concern:   - - Located in a basement between two [place]; concern regarding fatiguing with steps and long walk to the parking lot without rest breaks.   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  - - No built in breaks and unpredictable schedule for cross-covering other teachers  - - Kids pushing/shoving coming into and out of the classroom, which is a fall risk with her balance and causes anxiety  - -  Located at the front of the room vs. The middle, where kids lave less need to shout or cause excessive stimulation to get her attention  - - Expectation that she will work an entire day without breaks for cognitive fatigue  - Pt notes frequent GI upset and siarrhea with magnesium supplement, which she has stopped. She continues to note loose stools with coffee creamer and dairy; tried almond milk in her coffee today and notes no issues tolerating that. No issues with chronic constipation or diarrhea.   - Has had some difficulty falling asleep; cautious to take melatonin due to fears of medication overuse.   - Pt notes some issues with insurance, claiming ongoing status of hepatitis and renal failure based on documentation from her hospitalization.   Pain Inventory Average Pain 0 Pain Right Now 0 My pain is dull  In the last 24 hours, has pain interfered with the following? General activity 1 Relation with others 0 Enjoyment of life 0 What TIME of day is your pain at its worst? evening Sleep (in general) Fair  Pain is worse with: unsure Pain improves with: therapy/exercise Relief from Meds: 8  Family History  Problem Relation Age of Onset   Kidney failure Mother    Heart failure Mother    Lupus Mother    COPD Father    Hypertension Sister    Acromegaly Sister    Breast cancer Paternal Aunt 78   Social History   Socioeconomic History   Marital status: Married    Spouse name: Not on file   Number of children: 4  Years of education: 87   Highest education level: Not on file  Occupational History   Occupation: teacher  Tobacco Use   Smoking status: Former    Packs/day: 0.50    Years: 2.00    Total pack years: 1.00    Types: Cigarettes    Quit date: 05/16/1986    Years since quitting: 35.7   Smokeless tobacco: Never   Tobacco comments:    TEEN AGER  Vaping Use   Vaping Use: Never used  Substance and Sexual Activity   Alcohol use: No   Drug use: No   Sexual  activity: Not on file  Other Topics Concern   Not on file  Social History Narrative   Lives with husband.  Has 4 children.  Teacher.  Education: college.       Right Handed    Lives in a one story home    Drinks caffeine   Social Determinants of Health   Financial Resource Strain: Not on file  Food Insecurity: Not on file  Transportation Needs: Not on file  Physical Activity: Not on file  Stress: Not on file  Social Connections: Not on file   Past Surgical History:  Procedure Laterality Date   BREAST BIOPSY Left    ROTATOR CUFF REPAIR Right 06/16/2019   TUBAL LIGATION     Past Surgical History:  Procedure Laterality Date   BREAST BIOPSY Left    ROTATOR CUFF REPAIR Right 06/16/2019   TUBAL LIGATION     Past Medical History:  Diagnosis Date   Arthralgia 02/14/2016   Asthma    Autoimmune disease ([place]) 02/14/2016   Positive RF 139 CCP Negative  Positive ANA 1:640 Positive Ro Positive La Elevated ESR 105 - Sjorgen's Disease   DDD (degenerative disc disease), lumbar 02/14/2016   Fatigue 02/14/2016   Gastritis 02/14/2016   Hot flashes    Migraines 02/14/2016   Osteoarthritis of both hands 02/14/2016   Osteoarthritis of both knees 02/14/2016   Vitamin D deficiency 02/14/2016   BP 130/85   Pulse 80   Ht 5' 10" (1.778 m)   Wt 162 lb 9.6 oz (73.8 kg)   LMP 05/22/2012   SpO2 99%   BMI 23.33 kg/m   Opioid Risk Score:   Fall Risk Score:  `1  Depression screen [facility] 2/9     01/25/2022   11:28 AM 12/21/2021    1:01 PM 02/13/2017    3:21 PM  Depression screen PHQ 2/9  Decreased Interest 0 1 0  Down, Depressed, Hopeless 0 0 0  PHQ - 2 Score 0 1 0  Altered sleeping  2   Tired, decreased energy  3   Change in appetite  3   Feeling bad or failure about yourself   0   Trouble concentrating  2   Moving slowly or fidgety/restless  2   Suicidal thoughts  0   PHQ-9 Score  13      Review of Systems  Constitutional: Negative.   HENT: Negative.    Eyes: Negative.    Respiratory: Negative.    Cardiovascular: Negative.   Gastrointestinal: Negative.   Endocrine: Negative.   Genitourinary: Negative.   Musculoskeletal:  Positive for back pain.  Skin: Negative.   Allergic/Immunologic: Negative.   Neurological: Negative.   Hematological: Negative.   Psychiatric/Behavioral: Negative.    All other systems reviewed and are negative.      Objective:   Physical Exam  Constitution: Appropriate appearance for age. No apparnet distress  HEENT: PERRL, EOMI grossly intact. Mild vocal hoarseness, unchanged Resp: CTAB. No rales, rhonchi, or wheezing. Cardio: RRR. No mumurs, rubs, or gallops. No peripheral edema. Peripheral pulses 2+. Brisk capillary refill. Negative homan's sign on L.  Abdomen: Nondistended. Nontender. +bowel sounds. Psych: Appropriate mood and affect. Neuro: AAOx4. No gross deficits. Some mild delay in processing but overall much improved.   Neurologic Exam:   DTRs: Reflexes were 2+ in bilateral UE and Les.  Sensory exam: revealed normal sensation in all dermatomal regions in bilateral  Ues; Altered sensation over left calf and medial malleolus, extending onto dorsum of the foot.  Motor exam: strength normal in all myotomal regions of bilateral UE and LEs. Coordination: Fine motor coordination was normal.   Gait: Gait was normal. Able to toe and heel walk with mild difficulty. Can balance on one foot <3 seconds bilaterally.         Assessment & Plan:   CRISTINE DAW is a 56 y.o. female with history of DDD lumbar spine, asthma, autoimmune disease, Sjogren's with recent onset of trigeminal neuralgia and was started on Tegretol, who presents as f/u after IPR admission 3/53-6/1 for metabolic encephalopathy d/t Tegretol rxn. Today, dicussed return to work and reasonable accommodations, as well as difficulty sleeping and new dairy intolerance.  Toxic metabolic encephalopathy Assessment & Plan: Due to Tegretol drug reaction, causing  hospitalization and subsequent inpatient rehabilitation stay.  Note elevated LFTs and Cr were part of the sequela of this drug reaction and hospitalization, and do not represent a diagnosis of chronic hepatitis or chronic renal failure.   If needed, can repeat CMP as OP to ensure values have normalized.   Also encouraged patient to reach out to the hospital records if she wishes to have these diagnoses removed from her chart.       Decreased mobility and endurance Assessment & Plan: Continue OP PT, OT, and SLP services.  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  Encouraged patient to check with her insurer for any limitations on # therapy days per year.    Difficulty sleeping Assessment & Plan: Encouraged use of OTC melatonin 5 mg    Dairy product intolerance Assessment & Plan: Enouraged trial of OTC lactaid and/or dairy avoidance.  Otherwise, stools regular, no concern for incontinence or chronic constipation.   Mild cognitive impairment Assessment & Plan: - Included in accomodations letter for work SLP guidance on low stim environment and cognitive rest periods.   - Follow up in 1 month to review progress   Paresthesia of left leg Assessment & Plan: No concern for DVT, may represent peripheral nerve injury from prior drug reaction or hospitalization. Does have chronic overlying peripheral neuropathy.  At follow up, if no improvement, will order OP EMG for workup.       Gertie Gowda, DO 01/25/2022

## 2022-01-25 NOTE — Assessment & Plan Note (Signed)
Enouraged trial of OTC lactaid and/or dairy avoidance.  Otherwise, stools regular, no concern for incontinence or chronic constipation.

## 2022-01-25 NOTE — Assessment & Plan Note (Signed)
-   Included in accomodations letter for work SLP guidance on low stim environment and cognitive rest periods.   - Follow up in 1 month to review progress

## 2022-01-25 NOTE — Assessment & Plan Note (Signed)
Continue OP PT, OT, and SLP services.  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  Encouraged patient to check with her insurer for any limitations on # therapy days per year.

## 2022-01-25 NOTE — Assessment & Plan Note (Signed)
Due to Tegretol drug reaction, causing hospitalization and subsequent inpatient rehabilitation stay.  Note elevated LFTs and Cr were part of the sequela of this drug reaction and hospitalization, and do not represent a diagnosis of chronic hepatitis or chronic renal failure.   If needed, can repeat CMP as OP to ensure values have normalized.   Also encouraged patient to reach out to the hospital records if she wishes to have these diagnoses removed from her chart.

## 2022-01-25 NOTE — Assessment & Plan Note (Signed)
No concern for DVT, may represent peripheral nerve injury from prior drug reaction or hospitalization. Does have chronic overlying peripheral neuropathy.  At follow up, if no improvement, will order OP EMG for workup.

## 2022-01-25 NOTE — Assessment & Plan Note (Signed)
Encouraged use of OTC melatonin 5 mg

## 2022-01-25 NOTE — Patient Instructions (Addendum)
I will be writing a note for work accomodation. It will be available via te portal, and a physical copy will be available here for pickup.  We will see you again in 4 weeks. If you have any specific questions or concerns, you can follow up sooner.   Please take OTC lactaid for diarrhea and use melatonin as needed nightly for sleep.  Talk to your insurance company about remaining days for PT.

## 2022-01-25 NOTE — Therapy (Signed)
OUTPATIENT SPEECH LANGUAGE PATHOLOGY TREATMENT NOTE   Patient Name: Megan Reid MRN: [id number] DOB:July 01, 1965, 56 y.o., female Today's Date: 01/25/2022  PCP: Aleda Grana MD REFERRING PROVIDER: Gertie Gowda, DO   END OF SESSION:   End of Session - 01/25/22 0906     Visit Number 7    Number of Visits 17    Date for SLP Re-Evaluation 03/01/22    SLP Start Time 0900   arrived late   SLP Stop Time  0930    SLP Time Calculation (min) 30 min    Activity Tolerance Patient tolerated treatment well;Patient limited by fatigue             Past Medical History:  Diagnosis Date   Arthralgia 02/14/2016   Asthma    Autoimmune disease ([place]) 02/14/2016   Positive RF 139 CCP Negative  Positive ANA 1:640 Positive Ro Positive [place] Elevated ESR 105 - Sjorgen's Disease   DDD (degenerative disc disease), lumbar 02/14/2016   Fatigue 02/14/2016   Gastritis 02/14/2016   Hot flashes    Migraines 02/14/2016   Osteoarthritis of both hands 02/14/2016   Osteoarthritis of both knees 02/14/2016   Vitamin D deficiency 02/14/2016   Past Surgical History:  Procedure Laterality Date   BREAST BIOPSY Left    ROTATOR CUFF REPAIR Right 06/16/2019   TUBAL LIGATION     Patient Active Problem List   Diagnosis Date Noted   Decreased mobility and endurance 01/25/2022   Difficulty sleeping 01/25/2022   Dairy product intolerance 01/25/2022   Mild cognitive impairment 01/25/2022   Anemia 12/20/2021   Abnormal LFTs 40/76/8088   Toxic metabolic encephalopathy 02/17/1593   Thrombocytopenia ([place]) 12/04/2021   Seasonal and perennial allergic rhinitis 12/02/2020   Chronic coughing 12/02/2020   Sjogren's syndrome ([place]) 12/02/2020   Leukopenia 12/02/2020   Adverse reaction to food, subsequent encounter 06/23/2020   Heartburn 06/23/2020   Bruising 06/23/2020   Abrasion of right ankle 02/13/2017   Rheumatoid factor positive 09/01/2016   Leucopenia 09/01/2016   Autoimmune disease ([place])  02/14/2016   Fatigue 02/14/2016   DDD (degenerative disc disease), lumbar 02/14/2016   Osteoarthritis of both knees 02/14/2016   Osteoarthritis of both hands 02/14/2016   Asthma 02/14/2016   Migraines 02/14/2016   Gastritis 02/14/2016   Vitamin D deficiency 02/14/2016   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 Yes; 8/10 left hip, hot/cold    OBJECTIVE:   TODAY'S TREATMENT:  01/25/22: Megan Reid enters wit hoarse voice. She denies coughing this morning. She completed SOVTE with mod I. Reinforced need to complete this before and during school day. She had short coughing episode she was able to stop with abdominal sniff blow with occasional min A. [name] verbalized 5 strategies for cognition/accommodations for return to work she can employ with rare min A. She would like to return for session next week prior to returning to work on 02/06/22.   01/18/22: Megan Reid reports carrying over strategies of keeping her keys on a hook, her laptop on the table and meds in the same spot in her bathroom  and she has used self talk to make sure she doesn't set the items down in  a random spot. She has not lost items this past week. She is reading successfully and has been able to add in some background distractions and continues to recall details from multi paragraph articles. Today, she generated strategy for word finding and slow processing she may experience in IEP meetings, staff meetings and conferences to self advocate for extra  response time, ask for repetitions and have a teacher partner take notes during [place] so she can focus on auditory processing. Coughing episode after multiple sneezes, with usual mod A of verbal cues and modeling abdominal sniff-blows, Megan Reid reduced and the stopped coughing.   01/12/22: Megan Reid reports that she has used VCD breathing to reduce duration of VCD - he husband commented "That was over so quick" as she was successful using the abdominal sniff/blow. Trigger was the mint smell of her toothpaste. Instructed her to "pre-treat" the VCD using the abdominal breathing before she encounters a trigger, with goal to eliminate cough. She demonstrated abdominal sniff blow with occasional min A for shoulders down and to relax neck and shoulders 3x. Instructed her to put a sign on her computer and in her class desk for shoulders down - she is working on this with OT as well. Megan Reid verbalizes carryover of strategies for attention and memory including reducing background noise, advocating for no interruptions, breaking tasks into smaller parts. She is aware of strategies and accommodations for attention and memory upon return to work.   01/10/22: Megan Reid reports success attending to and responding to 2 conversations at once, her family noted the success. She reports she successfully used abdominal sniff-blow to control a coughing episode on an airplane this past week. She continues to practice VCD breathing tx. We generated 2 activities to do at home to target multi tasking for return to classroom, including listening to podcasts with background noises and "working" on practice task on computer with TV. We generated strategy of using visuals to keep classroom noise to a minimum. 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 Megan Reid verbalized 3 strategies to compensation for reduced attention in the  classroom with rare min A.   9/21/223: Initiated training in compensatory strategies for attention, including external strategies of reducing back ground noise, getting important information in writing, repeating back what she has heard. Jannessa generated strategy of taking notes during staff meetings then comparing her notes to the secretary's to ensure she has all of the information she needs. 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 Amiri reports she has began to take notes on important conversations and information from HR, MD, insurance - I instructed her to read them back to the other person to ensure she has all details correct. During session, Ennifer had coughing attack triggered by a smell. She reports this has been going on for 3 years, she is on reflux meds. 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  Briefly  demonstrated breathing techniques for VCD and provided name of Dr. Chase Caller should she want to pursue work up for this.   PATIENT EDUCATION: Education details: compensations for attention and memory, accommodations for return to work, see Patient Instructions Person educated: Patient Education method: Explanation, Verbal cues, and Handouts Education comprehension: verbal cues required and needs further education         GOALS: Goals reviewed with patient? Yes   SHORT TERM GOALS: Target date: 02/01/2022   Pt will use compensations for attention and memory to report 50% reduction in loosing household items  (subjectively) Baseline: Goal status: MET   2.  Pt will carryover 3 strategies to recall 4 details from paragraph over 2 sessions Baseline:  Goal status: MET   3.  Pt will carryover 2 strategies to recall pertinent information from work, insurance, healthcare providers over 1 week Baseline:  Goal status: ONGOING   4.  Pt will verbalize 3 accommodations she can request upon return to work Baseline:  Goal status: Goal Met       LONG TERM GOALS: Target date:03/01/2022   Pt will carryover 4 strategies for attention and memory to recall pertinent details from 2 paragraphs and from phone calls/conversations over 2 sessions Baseline:  Goal status: ONGOING   2.  Pt will verbalize 4 accommodations she can use upon return to work with rare min A Baseline:  Goal status: MET   3.  Pt will improve score on cognitive function PROM -  Baseline: 67 Goal status: ONGOING  4. Pt will report 50% (subjectively) reduced coughing episodes.                Goal Status: ONGOING   ASSESSMENT:   CLINICAL IMPRESSION: Patient is a 56 y.o. female who was seen today for impairments in memory and attention. she presents with mild high level cognitive linguistic impairment. Ongoing training for compensatory strategies for attention/processing, memory. 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 She also has s/s of VCD/Inspiratory Laryngeal Obstruction. I recommend skilled ST to maximize cognition and train in compensatory strategies for cognitive communication impairments to maximize successful return to work. Added VCD goal.   OBJECTIVE IMPAIRMENTS include attention, memory, and expressive language. These impairments are limiting patient from return to work and effectively communicating at home and in community. Factors affecting potential to achieve goals and functional outcome are none.. Patient will benefit from skilled SLP services to  address above impairments and improve overall function.   REHAB POTENTIAL: Good   PLAN: SLP FREQUENCY: 2x/week   SLP DURATION: 8 weeks   PLANNED INTERVENTIONS: Language facilitation, Environmental controls, Cueing hierachy, Cognitive reorganization, and Internal/external aids         Breven Guidroz, Annye Rusk, CCC-SLP 01/25/2022, 1:44 PM

## 2022-01-25 NOTE — Patient Instructions (Signed)
Do your voice warm ups before school, during planning or lunch and on your way home  Gargle in the shower  Great job with meal prep the week you go back   Turn down any noise in the environment such as the TV, walk away from loud appliances, air conditioners, fans, dish washers etc   Repeat back what you have heard   Ask for information in writing (email, texts etc)   Write down important information   Have specific times for checking emails, texts, voice mails   Try to defer duty until after Christmas (2nd Semester duty only if possible) and switch duty to [place] a quiet space to rest and re-set your brain in between classes - let supervisor know you will need a few 15 minute breaks at first in quiet space - this won't be forever - 1st semester   Let parents know you will get back with them in 48 hours to give yourself a buffer   After work you may have more fatigue - don't plan on going out in the evenings or doing too much on the weekends at first   In large gatherings, sit or stay on the side not the center of the room   Try to sit with a wall behind you or in a corner so noise isn't coming at you from all directions when dining out or attending gatherings    Cell phone policy    Think about ways to muffle noise - carpets, drapes, fabric on the walls   When there is quiet working time, think about air pods or ear plugs   Consider taking a break in a quiet, dark space to focus on your breath and let your brain re-set    Compare you notes in meetings    Your brain is working hard to filter out all the noise, lights, other sensory things - if you are feeling agitated or out of sorts, think about how you can reduce your sensory input   Ask parent to not wear perfume to teacher conferences   Be aware of how loud the classroom is that you are trying to talk over - loud AC, music, appliances, band, bus    Try to rest your voice as much as possible - limit long phone  calls  IEP meetings    Extra time to respond in IEP meetings - provide extra response time in conversations, meetings, conferences    Review the IEP's before hand and have accommodations  written out    Meetings in quiet rooms, have slides or notes provided before the meeting, see if some kind soul will take notes during IEP for you   If you have any questions or concerns you would like to discuss during this meeting - please let me know or please e mail me any questions or concerns so you have a heads up   Have a time to keep the accommodations - through June or August   Good idea to buddy up with the SLP or OT for Behavior specialist or back up   When you are confronted with an aggressive or upset parent or student you will need to keep yourself as calm as possible - if you get stressed, you will be extra distracted and have less focus and your words may get bottled up more   Dr. Brand Males - chronic cough

## 2022-01-27 ENCOUNTER — Encounter: Payer: Self-pay | Admitting: Physical Medicine and Rehabilitation

## 2022-01-27 ENCOUNTER — Encounter: Payer: Self-pay | Admitting: Speech Pathology

## 2022-01-27 NOTE — Therapy (Signed)
OUTPATIENT PHYSICAL THERAPY NEURO TREATMENT   Patient Name: Megan Reid MRN: [id number] DOB:[birth year], 56 y.o., female Today's Date: 01/30/2022   PCP: Leeroy Cha  REFERRING PROVIDER: Gertie Gowda, DO    PT End of Session - 01/30/22 1325     Visit Number 6    PT Start Time 9485    PT Stop Time 1401    PT Time Calculation (min) 38 min    Activity Tolerance Patient tolerated treatment well;Patient limited by fatigue    Behavior During Therapy [facility] for tasks assessed/performed                 Past Medical History:  Diagnosis Date   Arthralgia 02/14/2016   Asthma    Autoimmune disease ([place]) 02/14/2016   Positive RF 139 CCP Negative  Positive ANA 1:640 Positive Ro Positive La Elevated ESR 105 - Sjorgen's Disease   DDD (degenerative disc disease), lumbar 02/14/2016   Fatigue 02/14/2016   Gastritis 02/14/2016   Hot flashes    Migraines 02/14/2016   Osteoarthritis of both hands 02/14/2016   Osteoarthritis of both knees 02/14/2016   Vitamin D deficiency 02/14/2016   Past Surgical History:  Procedure Laterality Date   BREAST BIOPSY Left    ROTATOR CUFF REPAIR Right 06/16/2019   TUBAL LIGATION     Patient Active Problem List   Diagnosis Date Noted   Decreased mobility and endurance 01/25/2022   Difficulty sleeping 01/25/2022   Dairy product intolerance 01/25/2022   Mild cognitive impairment 01/25/2022   Paresthesia of left leg 01/25/2022   Anemia 12/20/2021   Abnormal LFTs 46/27/0350   Toxic metabolic encephalopathy 09/38/1829   Thrombocytopenia ([place]) 12/04/2021   Seasonal and perennial allergic rhinitis 12/02/2020   Chronic coughing 12/02/2020   Sjogren's syndrome ([employer]) 12/02/2020   Leukopenia 12/02/2020   Adverse reaction to food, subsequent encounter 06/23/2020   Heartburn 06/23/2020   Bruising 06/23/2020   Abrasion of right ankle 02/13/2017   Rheumatoid factor positive 09/01/2016   Leucopenia 09/01/2016   Autoimmune disease ([place])  02/14/2016   Fatigue 02/14/2016   DDD (degenerative disc disease), lumbar 02/14/2016   Osteoarthritis of both knees 02/14/2016   Osteoarthritis of both hands 02/14/2016   Asthma 02/14/2016   Migraines 02/14/2016   Gastritis 02/14/2016   Vitamin D deficiency 02/14/2016   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[place]) 02/14/2016   High risk medication use 02/14/2016    ONSET DATE: 12/21/2021   REFERRING DIAG: G92.8 ([id number]) - Toxic metabolic encephalopathy   THERAPY DIAG:  Muscle weakness (generalized)  Other lack of coordination  Muscle weakness  Unsteadiness on feet  Rationale for Evaluation and Treatment Rehabilitation  SUBJECTIVE:  SUBJECTIVE STATEMENT: I am not in a good mood because 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   Pt accompanied by: self  PERTINENT HISTORY: Brief HPI:   Megan Reid is a 56 y.o. female with history of DDD lumbar spine, asthma, autoimmune disease, Sjogren's with recent onset of trigeminal neuralgia and was started on Tegretol.  She was also reported to have been taking ibuprofen as well as Tylenol for pain management.  She was found to have metabolic encephalopathy with acute hepatic and renal failure.  Renal failure treated with IV fluids and bicarb .  AKI felt to be due to ATN from acute illness, diarrhea and ibuprofen use.  She developed fever past admission and was treated with IV antibiotics due to concerns of sepsis.  Work-up was negative for DR ESS, tickborne illness, CMV, EBV and acute hepatitis/pancytopenia felt to be secondary to Tegretol toxicity.  She did not have pancytopenia and supportive care was recommended by heme-onc.  Renal status and abnormal LFTs were resolving however patient continued limited by weakness,  DOE as well as encephalopathy with delayed processing.  CIR was recommended due to functional decline.     Hospital Course: DANICKA HOURIHAN was admitted to rehab 12/12/2021 for inpatient therapies to consist of PT, ST and OT at least three hours five days a week. Past admission physiatrist, therapy team and rehab RN have worked together to provide customized collaborative inpatient rehab.  Her blood pressures were monitored on TID basis and has been stable.  Follow-up check of electrolytes revealed renal status as well as abnormal LFTs to be gradually improving.  She was found to have drop in magnesium to 1.3 requiring IV supplementation as well as p.o. supplement.  Repeat check showed improvement with magnesium of 2.0.  Recommend repeat check in 1 to 2 weeks to monitor for stability as well as need to continue oral supplementation.   Follow-up CBC showed improvement in white count with stable H&H and that thrombocytopenia had resolved.  Her alertness and mentation has gradually improved with improvement in activity tolerance and respiratory status.  Hydroxyzine 10 mg 3 times daily as needed was added to help manage anxiety.  Sleep-wake disruption is improved with addition of melatonin.  Constipation has resolved with augmentation of bowel regimen.  She has made steady gains during her rehab stay and is modified independent at discharge.  Supervision is recommended with cognitive task and for safety.  She will continue to receive follow-up outpatient PT, OT and ST at [facility] outpatient rehab after discharge.     Rehab course: During patient's stay in rehab weekly team conferences were held to monitor patient's progress, set goals and discuss barriers to discharge. At admission, patient required min assist with mobility and with ADL tasks.  She exhibited mild cognitive deficits affecting recall, memory and executive function with SLUMS score 22/30. She  has had improvement in activity tolerance, balance,  postural control as well as ability to compensate for deficits. She is able to complete ADL tasks at modified independent level. She is independent for transfers and is able to ambulate 150' without AD. She is able to climb 12 stairs independently. She requires supervision with functional and complex cognitive tasks. Her SLUMS score has improved to 26/30. Family education has been completed.    Disposition: Home  PAIN:  Are you having pain? No  PRECAUTIONS: Fall  WEIGHT BEARING RESTRICTIONS No  FALLS: Has patient fallen in last 6  months? Yes. Number of falls 1  LIVING ENVIRONMENT: Lives with: lives with their spouse Lives in: House/apartment Stairs: 4 STE B rails, home is handicap accessible except for bathtub  Has following equipment at home: None  PLOF: Independent and Independent with basic ADLs  PATIENT GOALS build strength and balance, endurance   OBJECTIVE:   DIAGNOSTIC FINDINGS:   COGNITION: Overall cognitive status: Impaired- memory issues after acute medical problem    SENSATION: Not tested   LOWER EXTREMITY ROM:     Active  Right Eval Left Eval  Hip flexion    Hip extension    Hip abduction    Hip adduction    Hip internal rotation    Hip external rotation    Knee flexion    Knee extension    Ankle dorsiflexion    Ankle plantarflexion    Ankle inversion    Ankle eversion     (Blank rows = not tested)  LOWER EXTREMITY MMT:    MMT Right Eval Left Eval  Hip flexion 3 3  Hip extension 2 2  Hip abduction 3 4-  Hip adduction    Hip internal rotation    Hip external rotation    Knee flexion 4 4  Knee extension 4 4  Ankle dorsiflexion 3 3  Ankle plantarflexion    Ankle inversion    Ankle eversion    (Blank rows = not tested)  BED MOBILITY:  Sit to supine Complete Independence Supine to sit Complete Independence Rolling to Right Complete Independence Rolling to Left Complete Independence  TRANSFERS: Assistive device utilized: None  Sit  to stand: Complete Independence Stand to sit: Complete Independence Chair to chair: Complete Independence Floor:    GAIT: Gait pattern: WFL and trendelenburg Distance walked: 623f  Assistive device utilized: None Level of assistance: Complete Independence Comments: WNL   FUNCTIONAL TESTs:  Berg Balance Scale: 42/56 2MWT 6747f  TODAY'S TREATMENT:  01/30/22 NuStep L5 x6m75m  Elliptical L2 x4 mins  Leg ext 10# 2x10, LLE 5# x5 Hs curls 25# 2x10, 15# x10 LLE  mini squats on BOSU 2x10  Ladder drills for coordination  Tandem walking  01/25/22 NuStep L5 x6mi66m Resisted side steps over obstacles 5x each way  Tandem on airex catch Step up on BOSU  Leg press 40# 2x10 Calf raises on leg press 2x12 Shoulder ext 10# 2x10  01/23/22 Bike L4 x5min108mWalking outdoors  Lateral step ups 6" Shoulder ext 10# 2x10 Cable rows 10# 2x10 Standing on airex hitting ball feet together, half tandem  Leg ext 5# 2x10 HS curls 25# 2x10 STS on airex 2x10   01/18/22 NuStep L6 x5mins73malking on beam narrow steps and side steps  Side steps on airex  Lateral band walks greenTB  Calf raises on black bar 2x10 Leg press 20# x10, 30# x10 Shoulder rows and ext greenTB 2x10  01/16/22 NuStep L5 x6mins 72mdges 2x10 SLR 2# 2x10 STS w/OHP x10, x10 with push out  Step ups 6"  Leg ext 5# 2x10 HS curls 25# 2x10  Resisted gait 20# 4 way x4   01/12/22 Nustep L4 x6 minutes  Bridges x5  Sidelying hip ABD x5 B   PATIENT EDUCATION: Education details: exam findings, POC, HEP  Person educated: Patient Education method: ExplanaConsulting civil engineerstration, and Handouts Education comprehension: verbalized understanding and returned demonstration   HOME EXERCISE PROGRAM: [id number]    GOALS: Goals reviewed with patient? Yes  SHORT TERM GOALS: Target date: 01/30/2022  Will be compliant  with appropriate progressive HEP  Baseline: Goal status: MET  2.  Will be able to name at least 3 ways to reduce  fall risk at home and in community  Baseline:  Goal status: MET  3.  Will tolerate standing tasks for at least 90 minutes without increase in fatigue  Baseline:  Goal status: IN PROGRESS  4.  Will be able to walk for at least 15 minutes without rest to show improved functional activity tolerance  Baseline:  Goal status: IN PROGRESS    LONG TERM GOALS: Target date: 02/27/2022  MMT to improve by at least 1 grade in all weak groups  Baseline:  Goal status: INITIAL  2.  Will score at least 50 on the Berg to show improved functional balance  Baseline:  Goal status: INITIAL  3.  Will tolerate at least 1/2 day at work without fatigue to show improved functional activity tolerance  Baseline:  Goal status: INITIAL  4.  Will be independent with appropriate gym based exercise program to maintain functional gains and continue progress on an independent basis  Baseline:  Goal status: INITIAL   ASSESSMENT:  CLINICAL IMPRESSION: Patient arrives a few minutes late to session, had to deal with some work related issues. We worked on some cardio and endurance on different machines today, she was visibly tired and SOB on the elliptical. Difficulty with tandem walking today, gets better with practice. We did some strengthening and isolated the LLE.   OBJECTIVE IMPAIRMENTS cardiopulmonary status limiting activity, decreased activity tolerance, decreased balance, decreased cognition, decreased mobility, difficulty walking, decreased strength, and improper body mechanics.   ACTIVITY LIMITATIONS standing, squatting, stairs, transfers, bed mobility, locomotion level, and caring for others  PARTICIPATION LIMITATIONS: community activity, occupation, and yard work  PERSONAL FACTORS Age, Fitness, Past/current experiences, and Time since onset of injury/illness/exacerbation are also affecting patient's functional outcome.   REHAB POTENTIAL: Good  CLINICAL DECISION MAKING: Evolving/moderate  complexity  EVALUATION COMPLEXITY: Moderate  PLAN: PT FREQUENCY: 2x/week  PT DURATION: 8 weeks  PLANNED INTERVENTIONS: Therapeutic exercises, Therapeutic activity, Neuromuscular re-education, Balance training, Gait training, Patient/Family education, Self Care, Joint mobilization, Stair training, Dry Needling, Cognitive remediation, Cryotherapy, Moist heat, Taping, Ultrasound, Ionotophoresis 62m/ml Dexamethasone, Manual therapy, and Re-evaluation  PLAN FOR NEXT SESSION:   MAndris Baumann DPT  01/30/2022, 2:02 PM

## 2022-01-30 ENCOUNTER — Ambulatory Visit: Payer: BC Managed Care – PPO

## 2022-01-30 ENCOUNTER — Ambulatory Visit: Payer: BC Managed Care – PPO | Admitting: Occupational Therapy

## 2022-01-30 DIAGNOSIS — M6281 Muscle weakness (generalized): Secondary | ICD-10-CM | POA: Diagnosis not present

## 2022-01-30 DIAGNOSIS — R2681 Unsteadiness on feet: Secondary | ICD-10-CM

## 2022-01-30 DIAGNOSIS — R278 Other lack of coordination: Secondary | ICD-10-CM

## 2022-01-31 NOTE — Therapy (Signed)
OUTPATIENT PHYSICAL THERAPY NEURO TREATMENT   Patient Name: Megan Reid MRN: [id number] DOB:[birth year], 56 y.o., female Today's Date: 02/01/2022   PCP: [name]  REFERRING PROVIDER: Angelina Sheriff, DO    PT End of Session - 02/01/22 1318     Visit Number 7    PT Start Time 1315    PT Stop Time 1400    PT Time Calculation (min) 45 min    Activity Tolerance Patient tolerated treatment well;Patient limited by fatigue    Behavior During Therapy [facility] for tasks assessed/performed                  Past Medical History:  Diagnosis Date   Arthralgia 02/14/2016   Asthma    Autoimmune disease ([facility]) 02/14/2016   Positive RF 139 CCP Negative  Positive ANA 1:640 Positive Ro Positive La Elevated ESR 105 - Sjorgen's Disease   DDD (degenerative disc disease), lumbar 02/14/2016   Fatigue 02/14/2016   Gastritis 02/14/2016   Hot flashes    Migraines 02/14/2016   Osteoarthritis of both hands 02/14/2016   Osteoarthritis of both knees 02/14/2016   Vitamin D deficiency 02/14/2016   Past Surgical History:  Procedure Laterality Date   BREAST BIOPSY Left    ROTATOR CUFF REPAIR Right 06/16/2019   TUBAL LIGATION     Patient Active Problem List   Diagnosis Date Noted   Decreased mobility and endurance 01/25/2022   Difficulty sleeping 01/25/2022   Dairy product intolerance 01/25/2022   Mild cognitive impairment 01/25/2022   Paresthesia of left leg 01/25/2022   Anemia 12/20/2021   Abnormal LFTs 12/20/2021   Toxic metabolic encephalopathy 12/12/2021   Thrombocytopenia ([facility]) 12/04/2021   Seasonal and perennial allergic rhinitis 12/02/2020   Chronic coughing 12/02/2020   Sjogren's syndrome ([facility]) 12/02/2020   Leukopenia 12/02/2020   Adverse reaction to food, subsequent encounter 06/23/2020   Heartburn 06/23/2020   Bruising 06/23/2020   Abrasion of right ankle 02/13/2017   Rheumatoid factor positive 09/01/2016   Leucopenia 09/01/2016   Autoimmune disease ([facility])  02/14/2016   Fatigue 02/14/2016   DDD (degenerative disc disease), lumbar 02/14/2016   Osteoarthritis of both knees 02/14/2016   Osteoarthritis of both hands 02/14/2016   Asthma 02/14/2016   Migraines 02/14/2016   Gastritis 02/14/2016   Vitamin D deficiency 02/14/2016   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[facility]) 02/14/2016   High risk medication use 02/14/2016    ONSET DATE: 12/21/2021   REFERRING DIAG: G92.8 (ICD-10-CM) - Toxic metabolic encephalopathy   THERAPY DIAG:  Muscle weakness (generalized)  Other lack of coordination  Muscle weakness  Unsteadiness on feet  Rationale for Evaluation and Treatment Rehabilitation  SUBJECTIVE:  SUBJECTIVE STATEMENT: I am doing fine, no pain   Pt accompanied by: self  PERTINENT HISTORY: Brief HPI:   PAYSLEIGH FRYMIER is a 56 y.o. female with history of DDD lumbar spine, asthma, autoimmune disease, Sjogren's with recent onset of trigeminal neuralgia and was started on Tegretol.  She was also reported to have been taking ibuprofen as well as Tylenol for pain management.  She was found to have metabolic encephalopathy with acute hepatic and renal failure.  Renal failure treated with IV fluids and bicarb .  AKI felt to be due to ATN from acute illness, diarrhea and ibuprofen use.  She developed fever past admission and was treated with IV antibiotics due to concerns of sepsis.  Work-up was negative for DR ESS, tickborne illness, CMV, EBV and acute hepatitis/pancytopenia felt to be secondary to Tegretol toxicity.  She did not have pancytopenia and supportive care was recommended by heme-onc.  Renal status and abnormal LFTs were resolving however patient continued limited by weakness, DOE as well as encephalopathy with delayed processing.  CIR was  recommended due to functional decline.     Hospital Course: JESSIC BECKLUND was admitted to rehab 12/12/2021 for inpatient therapies to consist of PT, ST and OT at least three hours five days a week. Past admission physiatrist, therapy team and rehab RN have worked together to provide customized collaborative inpatient rehab.  Her blood pressures were monitored on TID basis and has been stable.  Follow-up check of electrolytes revealed renal status as well as abnormal LFTs to be gradually improving.  She was found to have drop in magnesium to 1.3 requiring IV supplementation as well as p.o. supplement.  Repeat check showed improvement with magnesium of 2.0.  Recommend repeat check in 1 to 2 weeks to monitor for stability as well as need to continue oral supplementation.   Follow-up CBC showed improvement in white count with stable H&H and that thrombocytopenia had resolved.  Her alertness and mentation has gradually improved with improvement in activity tolerance and respiratory status.  Hydroxyzine 10 mg 3 times daily as needed was added to help manage anxiety.  Sleep-wake disruption is improved with addition of melatonin.  Constipation has resolved with augmentation of bowel regimen.  She has made steady gains during her rehab stay and is modified independent at discharge.  Supervision is recommended with cognitive task and for safety.  She will continue to receive follow-up outpatient PT, OT and ST at [facility] outpatient rehab after discharge.     Rehab course: During patient's stay in rehab weekly team conferences were held to monitor patient's progress, set goals and discuss barriers to discharge. At admission, patient required min assist with mobility and with ADL tasks.  She exhibited mild cognitive deficits affecting recall, memory and executive function with SLUMS score 22/30. She  has had improvement in activity tolerance, balance, postural control as well as ability to compensate for deficits.  She is able to complete ADL tasks at modified independent level. She is independent for transfers and is able to ambulate 150' without AD. She is able to climb 12 stairs independently. She requires supervision with functional and complex cognitive tasks. Her SLUMS score has improved to 26/30. Family education has been completed.    Disposition: Home  PAIN:  Are you having pain? No  PRECAUTIONS: Fall  WEIGHT BEARING RESTRICTIONS No  FALLS: Has patient fallen in last 6 months? Yes. Number of falls 1  LIVING ENVIRONMENT: Lives with: lives with their spouse Lives  in: House/apartment Stairs: 4 STE B rails, home is handicap accessible except for bathtub  Has following equipment at home: None  PLOF: Independent and Independent with basic ADLs  PATIENT GOALS build strength and balance, endurance   OBJECTIVE:   DIAGNOSTIC FINDINGS:   COGNITION: Overall cognitive status: Impaired- memory issues after acute medical problem    SENSATION: Not tested   LOWER EXTREMITY ROM:     Active  Right Eval Left Eval  Hip flexion    Hip extension    Hip abduction    Hip adduction    Hip internal rotation    Hip external rotation    Knee flexion    Knee extension    Ankle dorsiflexion    Ankle plantarflexion    Ankle inversion    Ankle eversion     (Blank rows = not tested)  LOWER EXTREMITY MMT:    MMT Right Eval Left Eval  Hip flexion 3 3  Hip extension 2 2  Hip abduction 3 4-  Hip adduction    Hip internal rotation    Hip external rotation    Knee flexion 4 4  Knee extension 4 4  Ankle dorsiflexion 3 3  Ankle plantarflexion    Ankle inversion    Ankle eversion    (Blank rows = not tested)  BED MOBILITY:  Sit to supine Complete Independence Supine to sit Complete Independence Rolling to Right Complete Independence Rolling to Left Complete Independence  TRANSFERS: Assistive device utilized: None  Sit to stand: Complete Independence Stand to sit: Complete  Independence Chair to chair: Complete Independence Floor:    GAIT: Gait pattern: WFL and trendelenburg Distance walked: 627ft  Assistive device utilized: None Level of assistance: Complete Independence Comments: WNL   FUNCTIONAL TESTs:  Berg Balance Scale: 42/56 632ft   TODAY'S TREATMENT:  02/01/22 Treadmill 1.5% 1. x13mins Bike L4 x  Resisted gait 30# 4 way x4  Rows and Lats 20# 2x10 Lateral step ups on BOSU SL on firm 7s at best  8" step up from airex  Leg press 40# 2x10  01/30/22 NuStep L5 x37mins  Elliptical L2 x4 mins  Leg ext 10# 2x10, LLE 5# x5 Hs curls 25# 2x10, 15# x10 LLE  mini squats on BOSU 2x10  Ladder drills for coordination  Tandem walking  01/25/22 NuStep L5 x75mins  Resisted side steps over obstacles 5x each way  Tandem on airex catch Step up on BOSU  Leg press 40# 2x10 Calf raises on leg press 2x12 Shoulder ext 10# 2x10  01/23/22 Bike L4 x11mins  Walking outdoors  Lateral step ups 6" Shoulder ext 10# 2x10 Cable rows 10# 2x10 Standing on airex hitting ball feet together, half tandem  Leg ext 5# 2x10 HS curls 25# 2x10 STS on airex 2x10   01/18/22 NuStep L6 x25mins  Walking on beam narrow steps and side steps  Side steps on airex  Lateral band walks greenTB  Calf raises on black bar 2x10 Leg press 20# x10, 30# x10 Shoulder rows and ext greenTB 2x10  01/16/22 NuStep L5 x67mins Bridges 2x10 SLR 2# 2x10 STS w/OHP x10, x10 with push out  Step ups 6"  Leg ext 5# 2x10 HS curls 25# 2x10  Resisted gait 20# 4 way x4   01/12/22 Nustep L4 x6 minutes  Bridges x5  Sidelying hip ABD x5 B   PATIENT EDUCATION: Education details: exam findings, POC, HEP  Person educated: Patient Education method: Explanation, Demonstration, and Handouts Education comprehension: verbalized understanding  and returned demonstration   HOME EXERCISE PROGRAM: [id number]    GOALS: Goals reviewed with patient? Yes  SHORT TERM GOALS: Target date:  01/30/2022  Will be compliant with appropriate progressive HEP  Baseline: Goal status: MET  2.  Will be able to name at least 3 ways to reduce fall risk at home and in community  Baseline:  Goal status: MET  3.  Will tolerate standing tasks for at least 90 minutes without increase in fatigue  Baseline:  Goal status: IN PROGRESS  4.  Will be able to walk for at least 15 minutes without rest to show improved functional activity tolerance  Baseline:  Goal status: IN PROGRESS    LONG TERM GOALS: Target date: 02/27/2022  MMT to improve by at least 1 grade in all weak groups  Baseline:  Goal status: INITIAL  2.  Will score at least 50 on the Berg to show improved functional balance  Baseline:  Goal status: INITIAL  3.  Will tolerate at least 1/2 day at work without fatigue to show improved functional activity tolerance  Baseline:  Goal status: INITIAL  4.  Will be independent with appropriate gym based exercise program to maintain functional gains and continue progress on an independent basis  Baseline:  Goal status: INITIAL   ASSESSMENT:  CLINICAL IMPRESSION: Continued to work on cardio and endurance on different machines today. Tired and needs a break after this.    OBJECTIVE IMPAIRMENTS cardiopulmonary status limiting activity, decreased activity tolerance, decreased balance, decreased cognition, decreased mobility, difficulty walking, decreased strength, and improper body mechanics.   ACTIVITY LIMITATIONS standing, squatting, stairs, transfers, bed mobility, locomotion level, and caring for others  PARTICIPATION LIMITATIONS: community activity, occupation, and yard work  PERSONAL FACTORS Age, Fitness, Past/current experiences, and Time since onset of injury/illness/exacerbation are also affecting patient's functional outcome.   REHAB POTENTIAL: Good  CLINICAL DECISION MAKING: Evolving/moderate complexity  EVALUATION COMPLEXITY: Moderate  PLAN: PT FREQUENCY:  2x/week  PT DURATION: 8 weeks  PLANNED INTERVENTIONS: Therapeutic exercises, Therapeutic activity, Neuromuscular re-education, Balance training, Gait training, Patient/Family education, Self Care, Joint mobilization, Stair training, Dry Needling, Cognitive remediation, Cryotherapy, Moist heat, Taping, Ultrasound, Ionotophoresis 4mg /ml Dexamethasone, Manual therapy, and Re-evaluation  PLAN FOR NEXT SESSION:   Cassie Freer, DPT  02/01/2022, 2:00 PM

## 2022-02-01 ENCOUNTER — Ambulatory Visit: Payer: BC Managed Care – PPO

## 2022-02-01 ENCOUNTER — Encounter: Payer: Self-pay | Admitting: Physical Medicine and Rehabilitation

## 2022-02-01 ENCOUNTER — Encounter: Payer: Self-pay | Admitting: Speech Pathology

## 2022-02-01 ENCOUNTER — Ambulatory Visit: Payer: BC Managed Care – PPO | Admitting: Speech Pathology

## 2022-02-01 DIAGNOSIS — R498 Other voice and resonance disorders: Secondary | ICD-10-CM

## 2022-02-01 DIAGNOSIS — R41841 Cognitive communication deficit: Secondary | ICD-10-CM

## 2022-02-01 DIAGNOSIS — M6281 Muscle weakness (generalized): Secondary | ICD-10-CM | POA: Diagnosis not present

## 2022-02-01 DIAGNOSIS — R2681 Unsteadiness on feet: Secondary | ICD-10-CM

## 2022-02-01 DIAGNOSIS — R278 Other lack of coordination: Secondary | ICD-10-CM

## 2022-02-01 NOTE — Therapy (Signed)
OUTPATIENT SPEECH LANGUAGE PATHOLOGY TREATMENT NOTE   Patient Name: Megan Reid MRN: [id number] DOB:1965/11/16, 56 y.o., female Today's Date: 02/01/2022  PCP: Aleda Grana MD REFERRING PROVIDER: Gertie Gowda, DO   END OF SESSION:   End of Session - 02/01/22 0950     Visit Number 8    Number of Visits 17    Date for SLP Re-Evaluation 03/01/22    SLP Start Time 0947   pt arrived late   SLP Stop Time  1015    SLP Time Calculation (min) 28 min    Activity Tolerance Patient tolerated treatment well;Patient limited by fatigue             Past Medical History:  Diagnosis Date   Arthralgia 02/14/2016   Asthma    Autoimmune disease ([place]) 02/14/2016   Positive RF 139 CCP Negative  Positive ANA 1:640 Positive Ro Positive [place] Elevated ESR 105 - Sjorgen's Disease   DDD (degenerative disc disease), lumbar 02/14/2016   Fatigue 02/14/2016   Gastritis 02/14/2016   Hot flashes    Migraines 02/14/2016   Osteoarthritis of both hands 02/14/2016   Osteoarthritis of both knees 02/14/2016   Vitamin D deficiency 02/14/2016   Past Surgical History:  Procedure Laterality Date   BREAST BIOPSY Left    ROTATOR CUFF REPAIR Right 06/16/2019   TUBAL LIGATION     Patient Active Problem List   Diagnosis Date Noted   Decreased mobility and endurance 01/25/2022   Difficulty sleeping 01/25/2022   Dairy product intolerance 01/25/2022   Mild cognitive impairment 01/25/2022   Paresthesia of left leg 01/25/2022   Anemia 12/20/2021   Abnormal LFTs 38/45/3646   Toxic metabolic encephalopathy 80/32/1224   Thrombocytopenia ([place]) 12/04/2021   Seasonal and perennial allergic rhinitis 12/02/2020   Chronic coughing 12/02/2020   Sjogren's syndrome ([place]) 12/02/2020   Leukopenia 12/02/2020   Adverse reaction to food, subsequent encounter 06/23/2020   Heartburn 06/23/2020   Bruising 06/23/2020   Abrasion of right ankle 02/13/2017   Rheumatoid factor positive 09/01/2016   Leucopenia  09/01/2016   Autoimmune disease ([place]) 02/14/2016   Fatigue 02/14/2016   DDD (degenerative disc disease), lumbar 02/14/2016   Osteoarthritis of both knees 02/14/2016   Osteoarthritis of both hands 02/14/2016   Asthma 02/14/2016   Migraines 02/14/2016   Gastritis 02/14/2016   Vitamin D deficiency 02/14/2016   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[place]) 02/14/2016   High risk medication use 02/14/2016    ONSET DATE: 12/04/2021  REFERRING DIAG: G92.8 (MGN-00-BB) - Toxic metabolic encephalopathy C48.8 ([id number]-IH) - Toxic metabolic encephalopathy   THERAPY DIAG:  Cognitive communication deficit  Other voice and resonance disorders  Rationale for Evaluation and Treatment Rehabilitation  SUBJECTIVE: "I am glad I got this before, they have to be approved" re: accommodations   PAIN:  Are you having pain? Yes; 8/10 left hip, hot/cold    OBJECTIVE:   TODAY'S TREATMENT:  02/01/22: Yanette enters with WNL voice - she had a trigger for cough (laughing) and was able to use sniff blow stop the cough with occasional min verbal cues - she coughed twice then got control and stopped coughing. Vocal hygiene education - she continues to work on eliminating throat clears and chronic cough. Makinna reports 50% reduction in coughing episodes and duration. She is concerned re: what others will think if she sniffs frequently - instructed her to self advocate and explain why she is sniffing/blowing. 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 She is concerned about walking long distance to her classroom from lot- she forgot to ask for close parking space in accommodations. Suggested she ask for temporary handicap pass - she will discuss with PT today.   01/25/22: Kemiyah enters wit hoarse voice. She denies coughing this morning. She completed SOVTE with mod I. Reinforced need to complete this before and during  school day. She had short coughing episode she was able to stop with abdominal sniff blow with occasional min A. Emaline verbalized 5 strategies for cognition/accommodations for return to work she can employ with rare min A. She would like to return for session next week prior to returning to work on 02/06/22.   01/18/22: Shaquan reports carrying over strategies of keeping her keys on a hook, her laptop on the table and meds in the same spot in her bathroom and she has used self talk to make sure she doesn't set the items down in  a random spot. She has not lost items this past week. She is reading successfully and has been able to add in some background distractions and continues to recall details from multi paragraph articles. Today, she generated strategy for word finding and slow processing she may experience in IEP meetings, staff meetings and conferences to self advocate for extra response time, ask for repetitions and have a teacher partner take notes during Leslie so she can focus on auditory processing. Coughing episode after multiple sneezes, with usual mod A of verbal cues and modeling abdominal sniff-blows, Mahati reduced and the stopped coughing.   01/12/22: Zanayah reports that she has used VCD breathing to reduce duration of VCD - he husband commented "That was over so quick" as she was successful using the abdominal sniff/blow. Trigger was the mint smell of her toothpaste. Instructed her to "pre-treat" the VCD using the abdominal breathing before she encounters a trigger, with goal to eliminate cough. She demonstrated abdominal sniff blow with occasional min A for shoulders down and to relax neck and shoulders 3x. Instructed her to put a sign on her computer and in her class desk for shoulders down - she is working on this with OT as well. Latesha verbalizes carryover of strategies for attention and memory including reducing background noise, advocating for no interruptions, breaking tasks into  smaller parts. She is aware of strategies and accommodations for attention and memory upon return to work.   01/10/22: Aleida reports success attending to and responding to 2 conversations at once, her family noted the success. She reports she successfully used abdominal sniff-blow to control a coughing episode on an airplane this past week. She continues to practice VCD breathing tx. We generated 2 activities to do at home to target multi tasking for return to classroom, including listening to podcasts with background noises and "working" on practice task on computer with TV. We generated strategy of using visuals to keep classroom noise to a minimum. Due to mildly hoarse voice, trained Trisa in throat clear alternatives and Semi-occluded vocal tract exercises to warm up before teaching and throughout the day. Toniette verbalized 3 strategies to compensation for reduced attention in the classroom with rare min A.   9/21/223: Initiated training in compensatory strategies for attention, including external strategies of reducing back ground noise, getting important information in writing, repeating back what she has heard. Palak generated strategy of taking notes during staff meetings then comparing her notes to the secretary's to ensure she has all of the  information she needs. 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 Chauncey reports she has began to take notes on important conversations and information from HR, MD, insurance - I instructed her to read them back to the other person to ensure she has all details correct. During session, Felishia had coughing attack triggered by a smell. She reports  this has been going on for 3 years, she is on reflux meds. 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  Briefly demonstrated breathing techniques for VCD and provided name of Dr. Chase Caller should she want to pursue work up for this.   PATIENT EDUCATION: Education details: compensations for attention and memory, accommodations for return to work, see Patient Instructions Person educated: Patient Education method: Explanation, Verbal cues, and Handouts Education comprehension: verbal cues required and needs further education         GOALS: Goals reviewed with patient? Yes   SHORT TERM GOALS: Target date: 02/01/2022   Pt will use compensations for attention and memory to report 50% reduction in loosing household items (subjectively) Baseline: Goal status: MET   2.  Pt will carryover 3 strategies to recall 4 details from paragraph over 2 sessions Baseline:  Goal status: MET   3.  Pt will carryover 2 strategies to recall pertinent information from work, insurance, healthcare providers over 1 week Baseline:  Goal status: MET   4.  Pt will verbalize 3 accommodations she can request upon return to work Baseline:  Goal status: Goal Met       LONG TERM GOALS: Target date:03/01/2022   Pt will carryover 4 strategies for attention and memory to recall pertinent details from 2 paragraphs and from phone calls/conversations over 2 sessions Baseline:  Goal status: MET   2.  Pt will verbalize 4 accommodations she can use upon return to work with rare min A Baseline:  Goal status: MET   3.  Pt will improve score on cognitive function PROM -  Baseline: 67 Goal status: ONGOING  4. Pt will report 50% (subjectively) reduced coughing episodes.                Goal Status: MET   ASSESSMENT:   CLINICAL IMPRESSION: Patient is a 56 y.o.  female who was seen today for impairments in memory and attention. she presents with mild high level cognitive linguistic impairment. Ongoing training for compensatory strategies for attention/processing, memory. 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 She also has s/s of VCD/Inspiratory Laryngeal Obstruction. I recommend skilled ST to maximize cognition and train in compensatory strategies for cognitive communication impairments to maximize successful return to work. Added VCD goal. She goes back to work 02/06/22 - Monday. Plan to have phone call check in ton 02/08/22 o determine if she needs to return to ST or be d/c'd next week.   OBJECTIVE IMPAIRMENTS include attention, memory, and expressive language. These impairments are limiting patient from return to work and effectively communicating at home and in community. Factors affecting potential to achieve goals and functional outcome  are none.. Patient will benefit from skilled SLP services to address above impairments and improve overall function.   REHAB POTENTIAL: Good   PLAN: SLP FREQUENCY: 2x/week   SLP DURATION: 8 weeks   PLANNED INTERVENTIONS: Language facilitation, Environmental controls, Cueing hierachy, Cognitive reorganization, and Internal/external aids         [place], Annye Rusk, CCC-SLP 02/01/2022, 3:41 PM

## 2022-02-01 NOTE — Patient Instructions (Addendum)
   Ask PT or your MD about a temporary Handicap pass through the end of school year or for  6 months - what ever your MD will do  [place] job sniffing as needed AND throughout the day - you are doing awesome  Continue your voice warm ups before and during the day  Consider using a humidifier in the winter in your bedroom  Try the Biotene gel and spray  Today, I think a cough was triggered by laughing - this is a common trigger  Dr. Yvonne Kendall or Dr. Gavin Pound are ENT's  (throat doctors) who can look at your vocal folds - your PCP can refer you to them and also to me if you need voice therapy during the summer  You can ask your ENT if can scope your voice box as well

## 2022-02-02 ENCOUNTER — Inpatient Hospital Stay: Payer: BC Managed Care – PPO | Attending: Oncology

## 2022-02-02 ENCOUNTER — Inpatient Hospital Stay (HOSPITAL_BASED_OUTPATIENT_CLINIC_OR_DEPARTMENT_OTHER): Payer: BC Managed Care – PPO | Admitting: Oncology

## 2022-02-02 ENCOUNTER — Encounter: Payer: Self-pay | Admitting: Physical Medicine and Rehabilitation

## 2022-02-02 VITALS — BP 142/87 | HR 73 | Temp 98.0°F | Resp 17 | Ht 70.0 in | Wt 164.6 lb

## 2022-02-02 DIAGNOSIS — D696 Thrombocytopenia, unspecified: Secondary | ICD-10-CM | POA: Diagnosis not present

## 2022-02-02 DIAGNOSIS — D709 Neutropenia, unspecified: Secondary | ICD-10-CM | POA: Diagnosis not present

## 2022-02-02 DIAGNOSIS — D649 Anemia, unspecified: Secondary | ICD-10-CM | POA: Insufficient documentation

## 2022-02-02 LAB — CBC WITH DIFFERENTIAL (CANCER CENTER ONLY)
Abs Immature Granulocytes: 0.01 10*3/uL (ref 0.00–0.07)
Basophils Absolute: 0 10*3/uL (ref 0.0–0.1)
Basophils Relative: 1 %
Eosinophils Absolute: 0 10*3/uL (ref 0.0–0.5)
Eosinophils Relative: 1 %
HCT: 29.9 % — ABNORMAL LOW (ref 36.0–46.0)
Hemoglobin: 9.8 g/dL — ABNORMAL LOW (ref 12.0–15.0)
Immature Granulocytes: 0 %
Lymphocytes Relative: 39 %
Lymphs Abs: 1.1 10*3/uL (ref 0.7–4.0)
MCH: 28.9 pg (ref 26.0–34.0)
MCHC: 32.8 g/dL (ref 30.0–36.0)
MCV: 88.2 fL (ref 80.0–100.0)
Monocytes Absolute: 0.4 10*3/uL (ref 0.1–1.0)
Monocytes Relative: 15 %
Neutro Abs: 1.2 10*3/uL — ABNORMAL LOW (ref 1.7–7.7)
Neutrophils Relative %: 44 %
Platelet Count: 145 10*3/uL — ABNORMAL LOW (ref 150–400)
RBC: 3.39 MIL/uL — ABNORMAL LOW (ref 3.87–5.11)
RDW: 14.6 % (ref 11.5–15.5)
Smear Review: NORMAL
WBC Count: 2.8 10*3/uL — ABNORMAL LOW (ref 4.0–10.5)
nRBC: 0 % (ref 0.0–0.2)

## 2022-02-02 NOTE — Progress Notes (Signed)
Hematology and Oncology Follow Up Visit  Megan Reid [id number] 1965/08/18 56 y.o. 02/02/2022 2:46 PM   Principle Diagnosis: 56 year old woman with neutropenia diagnosed in 2013.  Her work-up did not reveal a hematological disorder and the etiology is likely autoimmune in nature.   Prior Therapy: She is status post a bone marrow biopsy which did not show any evidence of dysplasia. PET CT scan in October 2015 did not show any evidence of lymphadenopathy.  Current therapy: Active surveillance.   Interim History:  Megan Reid returns today for a follow-up visit.  Since last visit, she was hospitalized in August 2023 after presenting with encephalopathy acute hepatitis likely related to carbamazepine.  She required ICU stay as well as acute rehab and currently discharged home.  During her hospitalization she did develop acute worsening of her thrombocytopenia with a platelet count down to 11 and spontaneously recovered up to 182.  Since her discharge, she feels reasonably well and ready to resume work related duties in the near future.  She denies any nausea, vomiting or abdominal pain.  She denies any hematochezia or melena.      Medications: Reviewed without changes.  Current Outpatient Medications  Medication Sig Dispense Refill   albuterol (PROVENTIL HFA;VENTOLIN HFA) 108 (90 Base) MCG/ACT inhaler Inhale 2 puffs into the lungs 2 (two) times daily. For shortness of breath 18 g 0   fluticasone (FLONASE) 50 MCG/ACT nasal spray Place 2 sprays into both nostrils daily. 16 g 2   hydrOXYzine (ATARAX) 10 MG tablet Take 1 tablet (10 mg total) by mouth 3 (three) times daily as needed for anxiety. 30 tablet 0   melatonin 5 MG TABS Take 1 tablet (5 mg total) by mouth at bedtime as needed. 30 tablet 0   metoprolol tartrate (LOPRESSOR) 25 MG tablet Take 1 tablet (25 mg total) by mouth 2 (two) times daily. 60 tablet 0   montelukast (SINGULAIR) 10 MG tablet Take 1 tablet (10 mg total) by mouth at  bedtime. 30 tablet 5   Multiple Vitamin (MULTIVITAMIN WITH MINERALS) TABS tablet Take 1 tablet by mouth daily.     pantoprazole (PROTONIX) 40 MG tablet Take 1 tablet (40 mg total) by mouth daily. 30 tablet 5   polyethylene glycol (MIRALAX / GLYCOLAX) 17 g packet Take 17 g by mouth every other day. 14 each 0   Spacer/Aero-Holding Chambers (AEROCHAMBER PLUS) inhaler Use as instructed 2 each 2   SYMBICORT 160-4.5 MCG/ACT inhaler INHALE 2 PUFFS INTO THE LUNGS TWICE DAILY (Patient taking differently: Inhale 2 puffs into the lungs in the morning and at bedtime.) 10.2 g 5   No current facility-administered medications for this visit.    Allergies:  Allergies  Allergen Reactions   Sulfa Antibiotics Anaphylaxis, Hives and Swelling    Swelling of the face and tongue    Tegretol [Carbamazepine] Other (See Comments)    Suspected hepatotoxicity and pancytopenia, required inpatient admission 11/2021      Physical Exam:    Blood pressure (!) 142/87, pulse 73, temperature 98 F (36.7 C), temperature source Temporal, resp. rate 17, height _0  (1.778 m), weight 164 lb 9.6 oz (74.7 kg), last menstrual period 05/22/2012, SpO2 100 %.    ECOG: 1     General appearance: Comfortable appearing without any discomfort Head: Normocephalic without any trauma Oropharynx: Mucous membranes are moist and pink without any thrush or ulcers. Eyes: Pupils are equal and round reactive to light. Lymph nodes: No cervical, supraclavicular, inguinal or axillary lymphadenopathy.  Heart:regular rate and rhythm.  S1 and S2 without leg edema. Lung: Clear without any rhonchi or wheezes.  No dullness to percussion. Abdomin: Soft, nontender, nondistended with good bowel sounds.  No hepatosplenomegaly. Musculoskeletal: No joint deformity or effusion.  Full range of motion noted. Neurological: No deficits noted on motor, sensory and deep tendon reflex exam. Skin: No petechial rash or dryness.  Appeared moist.          Lab Results: Lab Results  Component Value Date   WBC 3.1 (L) 12/14/2021   HGB 7.6 (L) 12/14/2021   HCT 23.2 (L) 12/14/2021   MCV 86.2 12/14/2021   PLT 182 12/14/2021     Chemistry      Component Value Date/Time   NA 141 12/18/2021 0549   NA 142 09/29/2015 0000   NA 139 04/13/2015 1323   K 4.0 12/18/2021 0549   K 4.2 04/13/2015 1323   CL 112 (H) 12/18/2021 0549   CL 104 03/23/2013 0101   CL 105 01/11/2012 1349   CO2 23 12/18/2021 0549   CO2 26 04/13/2015 1323   BUN 19 12/18/2021 0549   BUN 18 09/29/2015 0000   BUN 15.9 04/13/2015 1323   CREATININE 1.15 (H) 12/18/2021 0549   CREATININE 0.89 10/30/2016 1046   CREATININE 0.9 04/13/2015 1323   GLU 92 09/29/2015 0000      Component Value Date/Time   CALCIUM 8.6 (L) 12/18/2021 0549   CALCIUM 9.7 04/13/2015 1323   ALKPHOS 118 12/13/2021 0520   ALKPHOS 65 04/13/2015 1323   AST 144 (H) 12/13/2021 0520   AST 26 04/13/2015 1323   ALT 237 (H) 12/13/2021 0520   ALT 13 04/13/2015 1323   BILITOT 1.3 (H) 12/13/2021 0520   BILITOT 0.30 04/13/2015 1323         Impression and Plan:  56 year old woman with:   1.  Neutropenia diagnosed in 2013.  She had fluctuating absolute neutrophil count without any need for treatment.  Felt to be autoimmune in nature.    Laboratory data during her hospitalization were personally reviewed and showed absolute neutrophil count of 1700 on August 30.  At this time, I have recommended no further intervention or need for growth factor support.  Her previous hematology work-up did not show any evidence of a bone marrow process.  Her repeat a white cell count showed count of 2.8 which is consistent with her baseline.   2.  Anemia: Likely related to her recent hospitalization and illness.  Her hemoglobin continues to improve and currently up to 9.8.  No additional intervention is needed at this time and I anticipate spontaneous recovery.  3.  Follow-up: In 6 months for repeat  follow-up.   30 minutes were spent on this visit.  The time was dedicated to reviewing laboratory data, disease status update and outlining future plan of care discussion.   Zola Button, MD 10/19/20232:46 PM

## 2022-02-06 ENCOUNTER — Ambulatory Visit: Payer: BC Managed Care – PPO | Admitting: Occupational Therapy

## 2022-02-07 ENCOUNTER — Ambulatory Visit: Payer: BC Managed Care – PPO

## 2022-02-07 DIAGNOSIS — R2681 Unsteadiness on feet: Secondary | ICD-10-CM

## 2022-02-07 DIAGNOSIS — M6281 Muscle weakness (generalized): Secondary | ICD-10-CM | POA: Diagnosis not present

## 2022-02-07 DIAGNOSIS — G8929 Other chronic pain: Secondary | ICD-10-CM

## 2022-02-07 NOTE — Therapy (Signed)
OUTPATIENT PHYSICAL THERAPY NEURO TREATMENT   Patient Name: Megan Reid MRN: [id number] DOB:1965/12/30, 56 y.o., female Today's Date: 02/07/2022   PCP: Leeroy Cha  REFERRING PROVIDER: Gertie Gowda, DO    PT End of Session - 02/07/22 1804     Visit Number 8    PT Start Time 1324    PT Stop Time 4010    PT Time Calculation (min) 41 min    Activity Tolerance Patient tolerated treatment well;Patient limited by fatigue    Behavior During Therapy [facility] for tasks assessed/performed                   Past Medical History:  Diagnosis Date   Arthralgia 02/14/2016   Asthma    Autoimmune disease ([place]) 02/14/2016   Positive RF 139 CCP Negative  Positive ANA 1:640 Positive Ro Positive La Elevated ESR 105 - Sjorgen's Disease   DDD (degenerative disc disease), lumbar 02/14/2016   Fatigue 02/14/2016   Gastritis 02/14/2016   Hot flashes    Migraines 02/14/2016   Osteoarthritis of both hands 02/14/2016   Osteoarthritis of both knees 02/14/2016   Vitamin D deficiency 02/14/2016   Past Surgical History:  Procedure Laterality Date   BREAST BIOPSY Left    ROTATOR CUFF REPAIR Right 06/16/2019   TUBAL LIGATION     Patient Active Problem List   Diagnosis Date Noted   Decreased mobility and endurance 01/25/2022   Difficulty sleeping 01/25/2022   Dairy product intolerance 01/25/2022   Mild cognitive impairment 01/25/2022   Paresthesia of left leg 01/25/2022   Anemia 12/20/2021   Abnormal LFTs 27/25/3664   Toxic metabolic encephalopathy 40/34/7425   Thrombocytopenia ([place]) 12/04/2021   Seasonal and perennial allergic rhinitis 12/02/2020   Chronic coughing 12/02/2020   Sjogren's syndrome ([place]) 12/02/2020   Leukopenia 12/02/2020   Adverse reaction to food, subsequent encounter 06/23/2020   Heartburn 06/23/2020   Bruising 06/23/2020   Abrasion of right ankle 02/13/2017   Rheumatoid factor positive 09/01/2016   Leucopenia 09/01/2016   Autoimmune disease ([place])  02/14/2016   Fatigue 02/14/2016   DDD (degenerative disc disease), lumbar 02/14/2016   Osteoarthritis of both knees 02/14/2016   Osteoarthritis of both hands 02/14/2016   Asthma 02/14/2016   Migraines 02/14/2016   Gastritis 02/14/2016   Vitamin D deficiency 02/14/2016   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[place]) 02/14/2016   High risk medication use 02/14/2016    ONSET DATE: 12/21/2021   REFERRING DIAG: G92.8 ([id number]) - Toxic metabolic encephalopathy   THERAPY DIAG:  Muscle weakness (generalized)  Unsteadiness on feet  Chronic bilateral low back pain, unspecified whether sciatica present  Rationale for Evaluation and Treatment Rehabilitation  SUBJECTIVE:  SUBJECTIVE STATEMENT: Endurance is still hard, going back to work was really hard yesterday and I was super tired. My back has been hurting since being back at work. I wore tennis shoes instead of flats and that helped with the pain.   Pt accompanied by: self  PERTINENT HISTORY: Brief HPI:   Megan Reid is a 56 y.o. female with history of DDD lumbar spine, asthma, autoimmune disease, Sjogren's with recent onset of trigeminal neuralgia and was started on Tegretol.  She was also reported to have been taking ibuprofen as well as Tylenol for pain management.  She was found to have metabolic encephalopathy with acute hepatic and renal failure.  Renal failure treated with IV fluids and bicarb .  AKI felt to be due to ATN from acute illness, diarrhea and ibuprofen use.  She developed fever past admission and was treated with IV antibiotics due to concerns of sepsis.  Work-up was negative for DR ESS, tickborne illness, CMV, EBV and acute hepatitis/pancytopenia felt to be secondary to Tegretol toxicity.  She did not have pancytopenia  and supportive care was recommended by heme-onc.  Renal status and abnormal LFTs were resolving however patient continued limited by weakness, DOE as well as encephalopathy with delayed processing.  CIR was recommended due to functional decline.     Hospital Course: Megan Reid was admitted to rehab 12/12/2021 for inpatient therapies to consist of PT, ST and OT at least three hours five days a week. Past admission physiatrist, therapy team and rehab RN have worked together to provide customized collaborative inpatient rehab.  Her blood pressures were monitored on TID basis and has been stable.  Follow-up check of electrolytes revealed renal status as well as abnormal LFTs to be gradually improving.  She was found to have drop in magnesium to 1.3 requiring IV supplementation as well as p.o. supplement.  Repeat check showed improvement with magnesium of 2.0.  Recommend repeat check in 1 to 2 weeks to monitor for stability as well as need to continue oral supplementation.   Follow-up CBC showed improvement in white count with stable H&H and that thrombocytopenia had resolved.  Her alertness and mentation has gradually improved with improvement in activity tolerance and respiratory status.  Hydroxyzine 10 mg 3 times daily as needed was added to help manage anxiety.  Sleep-wake disruption is improved with addition of melatonin.  Constipation has resolved with augmentation of bowel regimen.  She has made steady gains during her rehab stay and is modified independent at discharge.  Supervision is recommended with cognitive task and for safety.  She will continue to receive follow-up outpatient PT, OT and ST at [facility] outpatient rehab after discharge.     Rehab course: During patient's stay in rehab weekly team conferences were held to monitor patient's progress, set goals and discuss barriers to discharge. At admission, patient required min assist with mobility and with ADL tasks.  She exhibited mild  cognitive deficits affecting recall, memory and executive function with SLUMS score 22/30. She  has had improvement in activity tolerance, balance, postural control as well as ability to compensate for deficits. She is able to complete ADL tasks at modified independent level. She is independent for transfers and is able to ambulate 150' without AD. She is able to climb 12 stairs independently. She requires supervision with functional and complex cognitive tasks. Her SLUMS score has improved to 26/30. Family education has been completed.    Disposition: Home  PAIN:  Are you having  pain? No  PRECAUTIONS: Fall  WEIGHT BEARING RESTRICTIONS No  FALLS: Has patient fallen in last 6 months? Yes. Number of falls 1  LIVING ENVIRONMENT: Lives with: lives with their spouse Lives in: House/apartment Stairs: 4 STE B rails, home is handicap accessible except for bathtub  Has following equipment at home: None  PLOF: Independent and Independent with basic ADLs  PATIENT GOALS build strength and balance, endurance   OBJECTIVE:   DIAGNOSTIC FINDINGS:   COGNITION: Overall cognitive status: Impaired- memory issues after acute medical problem    SENSATION: Not tested   LOWER EXTREMITY ROM:     Active  Right Eval Left Eval  Hip flexion    Hip extension    Hip abduction    Hip adduction    Hip internal rotation    Hip external rotation    Knee flexion    Knee extension    Ankle dorsiflexion    Ankle plantarflexion    Ankle inversion    Ankle eversion     (Blank rows = not tested)  LOWER EXTREMITY MMT:    MMT Right Eval Left Eval  Hip flexion 3 3  Hip extension 2 2  Hip abduction 3 4-  Hip adduction    Hip internal rotation    Hip external rotation    Knee flexion 4 4  Knee extension 4 4  Ankle dorsiflexion 3 3  Ankle plantarflexion    Ankle inversion    Ankle eversion    (Blank rows = not tested)  BED MOBILITY:  Sit to supine Complete Independence Supine to sit  Complete Independence Rolling to Right Complete Independence Rolling to Left Complete Independence  TRANSFERS: Assistive device utilized: None  Sit to stand: Complete Independence Stand to sit: Complete Independence Chair to chair: Complete Independence Floor:    GAIT: Gait pattern: WFL and trendelenburg Distance walked: 630f  Assistive device utilized: None Level of assistance: Complete Independence Comments: WNL   FUNCTIONAL TESTs:  Berg Balance Scale: 42/56 2MWT 6728f  TODAY'S TREATMENT:  02/07/22 Ask about return to work, and goals Endurance training 5 mins on each machine NuStep L5, UBE L2, treadmill 2% 72m34mStanding on airex feet together hitting ball S2S on airex 2x10 Mini squats on BOSO on blue side 2x10    02/01/22 Treadmill 1.5% 1.5mp19m5min35mike L4 x 5mins34mesisted gait 30# 4 way x4  Rows and Lats 20# 2x10 Lateral step ups on BOSU SL on firm 7s at best  8" step up from airex  Leg press 40# 2x10  01/30/22 NuStep L5 x6mins 35mliptical L2 x4 mins  Leg ext 10# 2x10, LLE 5# x5 Hs curls 25# 2x10, 15# x10 LLE  mini squats on BOSU 2x10  Ladder drills for coordination  Tandem walking  01/25/22 NuStep L5 x6mins  51misted side steps over obstacles 5x each way  Tandem on airex catch Step up on BOSU  Leg press 40# 2x10 Calf raises on leg press 2x12 Shoulder ext 10# 2x10  01/23/22 Bike L4 x5mins  W60ming outdoors  Lateral step ups 6" Shoulder ext 10# 2x10 Cable rows 10# 2x10 Standing on airex hitting ball feet together, half tandem  Leg ext 5# 2x10 HS curls 25# 2x10 STS on airex 2x10   01/18/22 NuStep L6 x5mins  Wa66mng on beam narrow steps and side steps  Side steps on airex  Lateral band walks greenTB  Calf raises on black bar 2x10 Leg press 20# x10, 30# x10 Shoulder rows and ext greenTB  2x10  01/16/22 NuStep L5 x71mns Bridges 2x10 SLR 2# 2x10 STS w/OHP x10, x10 with push out  Step ups 6"  Leg ext 5# 2x10 HS curls 25# 2x10   Resisted gait 20# 4 way x4   01/12/22 Nustep L4 x6 minutes  Bridges x5  Sidelying hip ABD x5 B   PATIENT EDUCATION: Education details: exam findings, POC, HEP  Person educated: Patient Education method: EConsulting civil engineer DMedia planner and Handouts Education comprehension: verbalized understanding and returned demonstration   HOME EXERCISE PROGRAM: [email or web address]    GOALS: Goals reviewed with patient? Yes  SHORT TERM GOALS: Target date: 01/30/2022  Will be compliant with appropriate progressive HEP  Baseline: Goal status: MET  2.  Will be able to name at least 3 ways to reduce fall risk at home and in community  Baseline:  Goal status: MET  3.  Will tolerate standing tasks for at least 90 minutes without increase in fatigue  Baseline:  Goal status: IN PROGRESS  4.  Will be able to walk for at least 15 minutes without rest to show improved functional activity tolerance  Baseline:  Goal status: IN PROGRESS    LONG TERM GOALS: Target date: 02/27/2022  MMT to improve by at least 1 grade in all weak groups  Baseline:  Goal status: INITIAL  2.  Will score at least 50 on the Berg to show improved functional balance  Baseline:  Goal status: INITIAL  3.  Will tolerate at least 1/2 day at work without fatigue to show improved functional activity tolerance  Baseline:  Goal status: INITIAL  4.  Will be independent with appropriate gym based exercise program to maintain functional gains and continue progress on an independent basis  Baseline:  Goal status: INITIAL   ASSESSMENT:  CLINICAL IMPRESSION: We worked on endurance today on cardio machines today. Started working again yesterday and is having to do a lot of walking and is on her feet all day. She is having difficulty with this because she is fatiguing quick. She does well with endurance training today, has gotten better with her breathing pattern using techniques she was taught by her SLP.   OBJECTIVE IMPAIRMENTS  cardiopulmonary status limiting activity, decreased activity tolerance, decreased balance, decreased cognition, decreased mobility, difficulty walking, decreased strength, and improper body mechanics.   ACTIVITY LIMITATIONS standing, squatting, stairs, transfers, bed mobility, locomotion level, and caring for others  PARTICIPATION LIMITATIONS: community activity, occupation, and yard work  PERSONAL FACTORS Age, Fitness, Past/current experiences, and Time since onset of injury/illness/exacerbation are also affecting patient's functional outcome.   REHAB POTENTIAL: Good  CLINICAL DECISION MAKING: Evolving/moderate complexity  EVALUATION COMPLEXITY: Moderate  PLAN: PT FREQUENCY: 2x/week  PT DURATION: 8 weeks  PLANNED INTERVENTIONS: Therapeutic exercises, Therapeutic activity, Neuromuscular re-education, Balance training, Gait training, Patient/Family education, Self Care, Joint mobilization, Stair training, Dry Needling, Cognitive remediation, Cryotherapy, Moist heat, Taping, Ultrasound, Ionotophoresis 439mml Dexamethasone, Manual therapy, and Re-evaluation  PLAN FOR NEXT SESSION:   MoAndris BaumannDPT  02/07/2022, 6:46 PM

## 2022-02-09 ENCOUNTER — Encounter: Payer: Self-pay | Admitting: Occupational Therapy

## 2022-02-09 ENCOUNTER — Ambulatory Visit: Payer: BC Managed Care – PPO | Admitting: Occupational Therapy

## 2022-02-09 ENCOUNTER — Ambulatory Visit: Payer: BC Managed Care – PPO

## 2022-02-09 DIAGNOSIS — M6281 Muscle weakness (generalized): Secondary | ICD-10-CM

## 2022-02-09 DIAGNOSIS — R278 Other lack of coordination: Secondary | ICD-10-CM

## 2022-02-09 NOTE — Therapy (Signed)
OUTPATIENT OCCUPATIONAL THERAPY TREATMENT NOTE & DISCHARGE SUMMARY  Patient Name: Megan Reid MRN: [id number] DOB:[birth year], 56 y.o., female Today's Date: 02/09/2022  PCP: Megan Cha, MD REFERRING PROVIDER: Gertie Gowda, DO   OT End of Session - 02/09/22 1705     Visit Number 5    Number of Visits 7    Date for OT Re-Evaluation 02/10/22    Authorization Type [employer]    Authorization Time Period VL: 40    OT Start Time 1702    OT Stop Time 1744    OT Time Calculation (min) 42 min    Activity Tolerance Patient tolerated treatment well    Behavior During Therapy WFL for tasks assessed/performed            OCCUPATIONAL THERAPY DISCHARGE SUMMARY  Visits from Start of Care: 5  Current functional level related to goals / functional outcomes: Pt report she is currently able to complete all BADLs and most IADLs w/ at least Mod Ind; see goals below for detail   Remaining deficits: Decreased overall endurance and activity tolerance, some limitations w/ cognitive fatigue   Education / Equipment: Condition-specific education, energy conservation strategies, body mechanics, pt-specific HEP  Patient agrees to discharge. Patient goals were met. Patient is being discharged due to meeting the stated rehab goals.   Past Medical History:  Diagnosis Date   Arthralgia 02/14/2016   Asthma    Autoimmune disease ([place]) 02/14/2016   Positive RF 139 CCP Negative  Positive ANA 1:640 Positive Ro Positive La Elevated ESR 105 - Sjorgen's Disease   DDD (degenerative disc disease), lumbar 02/14/2016   Fatigue 02/14/2016   Gastritis 02/14/2016   Hot flashes    Migraines 02/14/2016   Osteoarthritis of both hands 02/14/2016   Osteoarthritis of both knees 02/14/2016   Vitamin D deficiency 02/14/2016   Past Surgical History:  Procedure Laterality Date   BREAST BIOPSY Left    ROTATOR CUFF REPAIR Right 06/16/2019   TUBAL LIGATION     Patient Active Problem List   Diagnosis  Date Noted   Decreased mobility and endurance 01/25/2022   Difficulty sleeping 01/25/2022   Dairy product intolerance 01/25/2022   Mild cognitive impairment 01/25/2022   Paresthesia of left leg 01/25/2022   Anemia 12/20/2021   Abnormal LFTs 40/98/1191   Toxic metabolic encephalopathy 47/82/9562   Thrombocytopenia ([place]) 12/04/2021   Seasonal and perennial allergic rhinitis 12/02/2020   Chronic coughing 12/02/2020   Sjogren's syndrome ([place]) 12/02/2020   Leukopenia 12/02/2020   Adverse reaction to food, subsequent encounter 06/23/2020   Heartburn 06/23/2020   Bruising 06/23/2020   Abrasion of right ankle 02/13/2017   Rheumatoid factor positive 09/01/2016   Leucopenia 09/01/2016   Autoimmune disease ([place]) 02/14/2016   Fatigue 02/14/2016   DDD (degenerative disc disease), lumbar 02/14/2016   Osteoarthritis of both knees 02/14/2016   Osteoarthritis of both hands 02/14/2016   Asthma 02/14/2016   Migraines 02/14/2016   Gastritis 02/14/2016   Vitamin D deficiency 02/14/2016   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[place]) 02/14/2016   High risk medication use 02/14/2016    ONSET DATE: 12/21/2021 (date of OT order)  REFERRING DIAG: G92.8 ([id number]) - Toxic metabolic encephalopathy  THERAPY DIAG:  Muscle weakness (generalized)  Other lack of coordination  Rationale for Evaluation and Treatment Rehabilitation  SUBJECTIVE:   SUBJECTIVE STATEMENT: Pt reports things have been going well; returning to work was tough at the start of this week, but much better yesterday and today Pt accompanied by: self  PERTINENT HISTORY: Megan Reid is a 56 y.o. female with history of DDD lumbar spine, asthma, autoimmune disease, Sjogren's with recent onset of trigeminal neuralgia and was started on Tegretol. She was also reported to have been taking ibuprofen as well as Tylenol for pain management. She was found to have metabolic encephalopathy with acute hepatic and renal failure.  Renal failure treated with IV fluids and bicarb. AKI felt to be due to ATN from acute illness, diarrhea and ibuprofen use. She developed fever past admission and was treated with IV antibiotics due to concerns of sepsis. Work-up was negative for DR ESS, tickborne illness, CMV, EBV and acute hepatitis/pancytopenia felt to be secondary to Tegretol toxicity. She did not have pancytopenia and supportive care was recommended by heme-onc. Renal status and abnormal LFTs were resolving however patient continued limited by weakness, DOE as well as encephalopathy with delayed processing. CIR was recommended due to functional decline.  PRECAUTIONS: Fall; no lifting more than 15 lbs, care w/ strenuous activity  PAIN: Are you having pain? No  PLOF: Independent, Vocation/Vocational requirements: high Education officer, museum (full-time), and Leisure: running  PATIENT GOALS: Get back to running; return to "everyday living;" improve reaching w/ RUE   OBJECTIVE:   HAND DOMINANCE: Right  UPPER EXTREMITY ROM     Active ROM Right Eval - 9/19 Right 10/2 Right 10/9  Shoulder flexion 69* 106* 124*  Shoulder abduction 72*    Shoulder adduction     Shoulder extension     Shoulder internal rotation     Shoulder external rotation 56*    Elbow flexion WFL    Elbow extension     (Blank rows = not tested)   TODAY'S TREATMENT:  02/09/22 ADLs/IADLs Reviewed participation in IADLs, problem-solving w/ pt prn to identify potentially beneficial compensatory strategies related to previously discussed energy conservation principles: focus on return-to-work and returning to driving  Therapeutic Exercise R shoulder ext against anchored red theraband x15 w/ good technique  R shoulder ER against anchored red theraband x15 after initial demo and occ verbal/tactile cues to decr compensatory trunk movements  Low-mid range R shoulder flexion against anchored red theraband and OT providing consistent verbal/tactile cues to decr  compensatory R shoulder hiking and/or elbow flexion  Closed-chain R shoulder flex to 90 deg w/ elbow ext using PVC glider held at an angle to decrease impact of gravity for increased ROM; completed 10x while seated w/ mod verbal/tactile cues to decrease shoulder hiking  AAROM of shoulder flex, holding PVC square w/ forearms in neutral while seated. OT provided min cues/facilitation to decrease compensatory hiking and maintain elbow ext    PATIENT EDUCATION: Education complete Person educated: Patient Education method: Explanation Education comprehension: verbalized understanding   HOME EXERCISE PROGRAM: Coordination activities;  Access Code: [email or web address] URL: [email or web address]  Exercises - Single Arm Chest Press with Resistance  - 2-3 x daily - 10-15 reps - Standing Shoulder Flexion with Resistance  - 2-3 sets - 10-15 reps - Shoulder External Rotation with Anchored Resistance with Towel Under Elbow  - 2-3 sets - 10-15 reps - Standing Low Shoulder Row with Anchored Resistance  - 2-3 sets - 10-15 reps   GOALS: Goals reviewed with patient? 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  Met - 01/12/22  2 Pt will demonstrate independence  w/ HEP designed for RUE strength, endurance, and coordination  Met - 02/09/22     LONG TERM GOALS: Target date: 02/10/22    Status:  1 Pt will improve efficiency during functional fine motor skills (e.g., buttons, ties, handwriting) by improving time to complete 9-HPT w/ dominant R hand to at least 25.5 sec  Baseline: Right: 26.18 sec; Left: 25.49 sec Met - 01/23/22; 19.8 sec  2 Pt will improve ROM of R shoulder flexion to at least 110 degrees w/out pain to improve participation in work-related tasks (e.g., writing on whiteboard  for class)  Baseline: 69 deg R shoulder flexion Met - 01/16/22: 106 deg     ASSESSMENT:  CLINICAL IMPRESSION: Megan Reid is a 56 y/o who has been seen in OP OT for decreased endurance, limited RUE ROM and strength, and limitations w/ participation in functional activities s/p toxic metabolic encephalopathy. Pt has shown improvements in areas addressed in therapy w/ both STGs and both LTGs met. Session today w/ focus on reviewing and updating HEP for RUE ROM and strength, reviewing previously discussed education, and discussing current functional status and progress toward goals. Pt is appropriate for d/c from skilled OT to HEP at this time, reports she is satisfied with progress, and is currently agreeable to discharge plan.   PLAN: OT FREQUENCY: 1x/week  OT DURATION: 6 weeks  PLANNED INTERVENTIONS: self care/ADL training, therapeutic exercise, therapeutic activity, manual therapy, functional mobility training, aquatic therapy, moist heat, cryotherapy, patient/family education, energy conservation, and DME and/or AE instructions  RECOMMENDED OTHER SERVICES: Currently receiving PT and SLP services  CONSULTED AND AGREED WITH PLAN OF CARE: Patient  PLAN FOR NEXT SESSION: D/C   Kathrine Cords, MSOT, OTR/L 02/09/2022, 6:02 PM

## 2022-02-13 ENCOUNTER — Ambulatory Visit: Payer: BC Managed Care – PPO | Admitting: Physical Therapy

## 2022-02-13 ENCOUNTER — Ambulatory Visit: Payer: BC Managed Care – PPO | Admitting: Occupational Therapy

## 2022-02-13 ENCOUNTER — Encounter: Payer: Self-pay | Admitting: Physical Therapy

## 2022-02-13 DIAGNOSIS — M6281 Muscle weakness (generalized): Secondary | ICD-10-CM | POA: Diagnosis not present

## 2022-02-13 DIAGNOSIS — R2681 Unsteadiness on feet: Secondary | ICD-10-CM

## 2022-02-13 DIAGNOSIS — R278 Other lack of coordination: Secondary | ICD-10-CM

## 2022-02-13 DIAGNOSIS — G8929 Other chronic pain: Secondary | ICD-10-CM

## 2022-02-13 NOTE — Therapy (Signed)
OUTPATIENT PHYSICAL THERAPY NEURO TREATMENT   Patient Name: Megan Reid MRN: [id number] DOB:07-Jan-1966, 56 y.o., female Today's Date: 02/13/2022   PCP: Megan Reid  REFERRING PROVIDER: Gertie Gowda, DO    PT End of Session - 02/13/22 1850     Visit Number 9    Date for PT Re-Evaluation 02/27/22    PT Start Time 1807    PT Stop Time 1845    PT Time Calculation (min) 38 min    Activity Tolerance Patient tolerated treatment well;Patient limited by fatigue    Behavior During Therapy [facility] for tasks assessed/performed                    Past Medical History:  Diagnosis Date   Arthralgia 02/14/2016   Asthma    Autoimmune disease ([place]) 02/14/2016   Positive RF 139 CCP Negative  Positive ANA 1:640 Positive Ro Positive [place] Elevated ESR 105 - Sjorgen's Disease   DDD (degenerative disc disease), lumbar 02/14/2016   Fatigue 02/14/2016   Gastritis 02/14/2016   Hot flashes    Migraines 02/14/2016   Osteoarthritis of both hands 02/14/2016   Osteoarthritis of both knees 02/14/2016   Vitamin D deficiency 02/14/2016   Past Surgical History:  Procedure Laterality Date   BREAST BIOPSY Left    ROTATOR CUFF REPAIR Right 06/16/2019   TUBAL LIGATION     Patient Active Problem List   Diagnosis Date Noted   Decreased mobility and endurance 01/25/2022   Difficulty sleeping 01/25/2022   Dairy product intolerance 01/25/2022   Mild cognitive impairment 01/25/2022   Paresthesia of left leg 01/25/2022   Anemia 12/20/2021   Abnormal LFTs 50/56/9794   Toxic metabolic encephalopathy 80/16/5537   Thrombocytopenia ([place]) 12/04/2021   Seasonal and perennial allergic rhinitis 12/02/2020   Chronic coughing 12/02/2020   Sjogren's syndrome ([place]) 12/02/2020   Leukopenia 12/02/2020   Adverse reaction to food, subsequent encounter 06/23/2020   Heartburn 06/23/2020   Bruising 06/23/2020   Abrasion of right ankle 02/13/2017   Rheumatoid factor positive 09/01/2016    Leucopenia 09/01/2016   Autoimmune disease ([place]) 02/14/2016   Fatigue 02/14/2016   DDD (degenerative disc disease), lumbar 02/14/2016   Osteoarthritis of both knees 02/14/2016   Osteoarthritis of both hands 02/14/2016   Asthma 02/14/2016   Migraines 02/14/2016   Gastritis 02/14/2016   Vitamin D deficiency 02/14/2016   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[place]) 02/14/2016   High risk medication use 02/14/2016    ONSET DATE: 12/21/2021   REFERRING DIAG: G92.8 (SMO-70-BE) - Toxic metabolic encephalopathy   THERAPY DIAG:  Muscle weakness (generalized)  Other lack of coordination  Unsteadiness on feet  Chronic bilateral low back pain, unspecified whether sciatica present  Rationale for Evaluation and Treatment Rehabilitation  SUBJECTIVE:  SUBJECTIVE STATEMENT: Patient reports she has to do a lot of walking at work, gets very fatigued.  Pt accompanied by: self  PERTINENT HISTORY: Brief HPI:   Megan Reid is a 56 y.o. female with history of DDD lumbar spine, asthma, autoimmune disease, Sjogren's with recent onset of trigeminal neuralgia and was started on Tegretol.  She was also reported to have been taking ibuprofen as well as Tylenol for pain management.  She was found to have metabolic encephalopathy with acute hepatic and renal failure.  Renal failure treated with IV fluids and bicarb .  AKI felt to be due to ATN from acute illness, diarrhea and ibuprofen use.  She developed fever past admission and was treated with IV antibiotics due to concerns of sepsis.  Work-up was negative for DR ESS, tickborne illness, CMV, EBV and acute hepatitis/pancytopenia felt to be secondary to Tegretol toxicity.  She did not have pancytopenia and supportive care was recommended by heme-onc.  Renal  status and abnormal LFTs were resolving however patient continued limited by weakness, DOE as well as encephalopathy with delayed processing.  CIR was recommended due to functional decline.     Hospital Course: Megan Reid was admitted to rehab 12/12/2021 for inpatient therapies to consist of PT, ST and OT at least three hours five days a week. Past admission physiatrist, therapy team and rehab RN have worked together to provide customized collaborative inpatient rehab.  Her blood pressures were monitored on TID basis and has been stable.  Follow-up check of electrolytes revealed renal status as well as abnormal LFTs to be gradually improving.  She was found to have drop in magnesium to 1.3 requiring IV supplementation as well as p.o. supplement.  Repeat check showed improvement with magnesium of 2.0.  Recommend repeat check in 1 to 2 weeks to monitor for stability as well as need to continue oral supplementation.   Follow-up CBC showed improvement in white count with stable H&H and that thrombocytopenia had resolved.  Her alertness and mentation has gradually improved with improvement in activity tolerance and respiratory status.  Hydroxyzine 10 mg 3 times daily as needed was added to help manage anxiety.  Sleep-wake disruption is improved with addition of melatonin.  Constipation has resolved with augmentation of bowel regimen.  She has made steady gains during her rehab stay and is modified independent at discharge.  Supervision is recommended with cognitive task and for safety.  She will continue to receive follow-up outpatient PT, OT and ST at [facility] outpatient rehab after discharge.     Rehab course: During patient's stay in rehab weekly team conferences were held to monitor patient's progress, set goals and discuss barriers to discharge. At admission, patient required min assist with mobility and with ADL tasks.  She exhibited mild cognitive deficits affecting recall, memory and executive  function with SLUMS score 22/30. She  has had improvement in activity tolerance, balance, postural control as well as ability to compensate for deficits. She is able to complete ADL tasks at modified independent level. She is independent for transfers and is able to ambulate 150' without AD. She is able to climb 12 stairs independently. She requires supervision with functional and complex cognitive tasks. Her SLUMS score has improved to 26/30. Family education has been completed.    Disposition: Home  PAIN:  Are you having pain? No  PRECAUTIONS: Fall  WEIGHT BEARING RESTRICTIONS No  FALLS: Has patient fallen in last 6 months? Yes. Number of falls 1  LIVING  ENVIRONMENT: Lives with: lives with their spouse Lives in: House/apartment Stairs: 4 STE B rails, home is handicap accessible except for bathtub  Has following equipment at home: None  PLOF: Independent and Independent with basic ADLs  PATIENT GOALS build strength and balance, endurance   OBJECTIVE:   DIAGNOSTIC FINDINGS:   COGNITION: Overall cognitive status: Impaired- memory issues after acute medical problem    SENSATION: Not tested   LOWER EXTREMITY ROM:     Active  Right Eval Left Eval  Hip flexion    Hip extension    Hip abduction    Hip adduction    Hip internal rotation    Hip external rotation    Knee flexion    Knee extension    Ankle dorsiflexion    Ankle plantarflexion    Ankle inversion    Ankle eversion     (Blank rows = not tested)  LOWER EXTREMITY MMT:    MMT Right Eval Left Eval  Hip flexion 3 3  Hip extension 2 2  Hip abduction 3 4-  Hip adduction    Hip internal rotation    Hip external rotation    Knee flexion 4 4  Knee extension 4 4  Ankle dorsiflexion 3 3  Ankle plantarflexion    Ankle inversion    Ankle eversion    (Blank rows = not tested)  BED MOBILITY:  Sit to supine Complete Independence Supine to sit Complete Independence Rolling to Right Complete  Independence Rolling to Left Complete Independence  TRANSFERS: Assistive device utilized: None  Sit to stand: Complete Independence Stand to sit: Complete Independence Chair to chair: Complete Independence Floor:    GAIT: Gait pattern: WFL and trendelenburg Distance walked: 674f  Assistive device utilized: None Level of assistance: Complete Independence Comments: WNL   FUNCTIONAL TESTs:  Berg Balance Scale: 42/56 2MWT 6779f  TODAY'S TREATMENT:  02/13/22 NuStep L5 x 6 minutes. Supine bridge with pelvic tilt, SLR on R, rolling supine<> L sidelie, holding R arm and leg in the air to engage trunk. 10 reps Sit to stand with red Tband around knees, 2# weight, 10 reps B side step against Red Tband at knees, 2 x 10 each direction Heel raises on step with BUE support 2 x 10 reps  02/07/22 Ask about return to work, and goals Endurance training 5 mins on each machine NuStep L5, UBE L2, treadmill 2% 27m44mStanding on airex feet together hitting ball S2S on airex 2x10 Mini squats on BOSO on blue side 2x10    02/01/22 Treadmill 1.5% [id number] L4 x 5mins3mesisted gait 30# 4 way x4  Rows and Lats 20# 2x10 Lateral step ups on BOSU SL on firm 7s at best  8" step up from airex  Leg press 40# 2x10  01/30/22 NuStep L5 x6mins 66mliptical L2 x4 mins  Leg ext 10# 2x10, LLE 5# x5 Hs curls 25# 2x10, 15# x10 LLE  mini squats on BOSU 2x10  Ladder drills for coordination  Tandem walking  01/25/22 NuStep L5 x6mins  85misted side steps over obstacles 5x each way  Tandem on airex catch Step up on BOSU  Leg press 40# 2x10 Calf raises on leg press 2x12 Shoulder ext 10# 2x10  01/23/22 Bike L4 x5mins  W19ming outdoors  Lateral step ups 6" Shoulder ext 10# 2x10 Cable rows 10# 2x10 Standing on airex hitting ball feet together, half tandem  Leg ext 5# 2x10 HS curls 25# 2x10 STS on airex 2x10  01/18/22 NuStep L6 x43mns  Walking on beam narrow steps and side steps   Side steps on airex  Lateral band walks greenTB  Calf raises on black bar 2x10 Leg press 20# x10, 30# x10 Shoulder rows and ext greenTB 2x10  01/16/22 NuStep L5 x651ms Bridges 2x10 SLR 2# 2x10 STS w/OHP x10, x10 with push out  Step ups 6"  Leg ext 5# 2x10 HS curls 25# 2x10  Resisted gait 20# 4 way x4   01/12/22 Nustep L4 x6 minutes  Bridges x5  Sidelying hip ABD x5 B   PATIENT EDUCATION: Education details: exam findings, POC, HEP  Person educated: Patient Education method: ExConsulting civil engineerDeMedia plannerand Handouts Education comprehension: verbalized understanding and returned demonstration   HOME EXERCISE PROGRAM: [id number]    GOALS: Goals reviewed with patient? Yes  SHORT TERM GOALS: Target date: 01/30/2022  Will be compliant with appropriate progressive HEP  Baseline: Goal status: MET  2.  Will be able to name at least 3 ways to reduce fall risk at home and in community  Baseline:  Goal status: MET  3.  Will tolerate standing tasks for at least 90 minutes without increase in fatigue  Baseline:  Goal status: IN PROGRESS  4.  Will be able to walk for at least 15 minutes without rest to show improved functional activity tolerance  Baseline:  Goal status: IN PROGRESS    LONG TERM GOALS: Target date: 02/27/2022  MMT to improve by at least 1 grade in all weak groups  Baseline:  Goal status: ongoing  2.  Will score at least 50 on the Berg to show improved functional balance  Baseline:  Goal status: INITIAL  3.  Will tolerate at least 1/2 day at work without fatigue to show improved functional activity tolerance  Baseline:  Goal status: INITIAL  4.  Will be independent with appropriate gym based exercise program to maintain functional gains and continue progress on an independent basis  Baseline:  Goal status: INITIAL   ASSESSMENT:  CLINICAL IMPRESSION: Patient arrived a bit late. She reports continued fatigue at work and SOB with activity.  Updated HEP to include additional trunk and LE strength and stabilization exercises. She reports she is a runner and understands concept of increasing cardiovascular fitness. Instructed to perform 2-4 exercises per day and cycle through program. She tolerated all activities, but was fatigued at the end of treatment.  OBJECTIVE IMPAIRMENTS cardiopulmonary status limiting activity, decreased activity tolerance, decreased balance, decreased cognition, decreased mobility, difficulty walking, decreased strength, and improper body mechanics.   ACTIVITY LIMITATIONS standing, squatting, stairs, transfers, bed mobility, locomotion level, and caring for others  PARTICIPATION LIMITATIONS: community activity, occupation, and yard work  PERSONAL FACTORS Age, Fitness, Past/current experiences, and Time since onset of injury/illness/exacerbation are also affecting patient's functional outcome.   REHAB POTENTIAL: Good  CLINICAL DECISION MAKING: Evolving/moderate complexity  EVALUATION COMPLEXITY: Moderate  PLAN: PT FREQUENCY: 2x/week  PT DURATION: 8 weeks  PLANNED INTERVENTIONS: Therapeutic exercises, Therapeutic activity, Neuromuscular re-education, Balance training, Gait training, Patient/Family education, Self Care, Joint mobilization, Stair training, Dry Needling, Cognitive remediation, Cryotherapy, Moist heat, Taping, Ultrasound, Ionotophoresis 74m56ml Dexamethasone, Manual therapy, and Re-evaluation  PLAN FOR NEXT SESSION: Assess tolerance to HEP, PN-re-assessment  SusEthel RanaT 02/13/22 6:52 PM  02/13/2022, 6:52 PM

## 2022-02-15 ENCOUNTER — Encounter: Payer: Self-pay | Admitting: Physical Medicine and Rehabilitation

## 2022-02-16 ENCOUNTER — Ambulatory Visit: Payer: BC Managed Care – PPO | Attending: Physical Medicine and Rehabilitation

## 2022-02-16 DIAGNOSIS — M6281 Muscle weakness (generalized): Secondary | ICD-10-CM

## 2022-02-16 DIAGNOSIS — R2681 Unsteadiness on feet: Secondary | ICD-10-CM

## 2022-02-16 DIAGNOSIS — R278 Other lack of coordination: Secondary | ICD-10-CM

## 2022-02-16 NOTE — Therapy (Signed)
OUTPATIENT PHYSICAL THERAPY NEURO TREATMENT   Patient Name: Megan Reid MRN: [id number] DOB:Apr 07, 1966, 56 y.o., female Today's Date: 02/16/2022   PCP: Megan Reid  REFERRING PROVIDER: Gertie Gowda, DO    PT End of Session - 02/16/22 1726     Visit Number 10    Date for PT Re-Evaluation 02/27/22    PT Start Time 1505    PT Stop Time 1800    PT Time Calculation (min) 35 min    Activity Tolerance Patient tolerated treatment well;Patient limited by fatigue    Behavior During Therapy [facility] for tasks assessed/performed              Progress Note Reporting Period 01/02/22 to 02/16/22  See note below for Objective Data and Assessment of Progress/Goals.           Past Medical History:  Diagnosis Date   Arthralgia 02/14/2016   Asthma    Autoimmune disease ([place]) 02/14/2016   Positive RF 139 CCP Negative  Positive ANA 1:640 Positive Ro Positive [place] Elevated ESR 105 - Sjorgen's Disease   DDD (degenerative disc disease), lumbar 02/14/2016   Fatigue 02/14/2016   Gastritis 02/14/2016   Hot flashes    Migraines 02/14/2016   Osteoarthritis of both hands 02/14/2016   Osteoarthritis of both knees 02/14/2016   Vitamin D deficiency 02/14/2016   Past Surgical History:  Procedure Laterality Date   BREAST BIOPSY Left    ROTATOR CUFF REPAIR Right 06/16/2019   TUBAL LIGATION     Patient Active Problem List   Diagnosis Date Noted   Decreased mobility and endurance 01/25/2022   Difficulty sleeping 01/25/2022   Dairy product intolerance 01/25/2022   Mild cognitive impairment 01/25/2022   Paresthesia of left leg 01/25/2022   Anemia 12/20/2021   Abnormal LFTs 69/79/4801   Toxic metabolic encephalopathy 65/53/7482   Thrombocytopenia ([place]) 12/04/2021   Seasonal and perennial allergic rhinitis 12/02/2020   Chronic coughing 12/02/2020   Sjogren's syndrome ([place]) 12/02/2020   Leukopenia 12/02/2020   Adverse reaction to food, subsequent encounter 06/23/2020    Heartburn 06/23/2020   Bruising 06/23/2020   Abrasion of right ankle 02/13/2017   Rheumatoid factor positive 09/01/2016   Leucopenia 09/01/2016   Autoimmune disease ([place]) 02/14/2016   Fatigue 02/14/2016   DDD (degenerative disc disease), lumbar 02/14/2016   Osteoarthritis of both knees 02/14/2016   Osteoarthritis of both hands 02/14/2016   Asthma 02/14/2016   Migraines 02/14/2016   Gastritis 02/14/2016   Vitamin D deficiency 02/14/2016   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[place]) 02/14/2016   High risk medication use 02/14/2016    ONSET DATE: 12/21/2021   REFERRING DIAG: G92.8 ([id number]) - Toxic metabolic encephalopathy   THERAPY DIAG:  Other lack of coordination  Unsteadiness on feet  Muscle weakness (generalized)  Rationale for Evaluation and Treatment Rehabilitation  SUBJECTIVE:  SUBJECTIVE STATEMENT: I am doing alright, still getting fatigued during work.   Pt accompanied by: self  PERTINENT HISTORY: Brief HPI:   [name] is a 56 y.o. female with history of DDD lumbar spine, asthma, autoimmune disease, Sjogren's with recent onset of trigeminal neuralgia and was started on Tegretol.  She was also reported to have been taking ibuprofen as well as Tylenol for pain management.  She was found to have metabolic encephalopathy with acute hepatic and renal failure.  Renal failure treated with IV fluids and bicarb .  AKI felt to be due to ATN from acute illness, diarrhea and ibuprofen use.  She developed fever past admission and was treated with IV antibiotics due to concerns of sepsis.  Work-up was negative for DR ESS, tickborne illness, CMV, EBV and acute hepatitis/pancytopenia felt to be secondary to Tegretol toxicity.  She did not have pancytopenia and supportive care was  recommended by heme-onc.  Renal status and abnormal LFTs were resolving however patient continued limited by weakness, DOE as well as encephalopathy with delayed processing.  CIR was recommended due to functional decline.     Hospital Course: [name] was admitted to rehab 12/12/2021 for inpatient therapies to consist of PT, ST and OT at least three hours five days a week. Past admission physiatrist, therapy team and rehab RN have worked together to provide customized collaborative inpatient rehab.  Her blood pressures were monitored on TID basis and has been stable.  Follow-up check of electrolytes revealed renal status as well as abnormal LFTs to be gradually improving.  She was found to have drop in magnesium to 1.3 requiring IV supplementation as well as p.o. supplement.  Repeat check showed improvement with magnesium of 2.0.  Recommend repeat check in 1 to 2 weeks to monitor for stability as well as need to continue oral supplementation.   Follow-up CBC showed improvement in white count with stable H&H and that thrombocytopenia had resolved.  Her alertness and mentation has gradually improved with improvement in activity tolerance and respiratory status.  Hydroxyzine 10 mg 3 times daily as needed was added to help manage anxiety.  Sleep-wake disruption is improved with addition of melatonin.  Constipation has resolved with augmentation of bowel regimen.  She has made steady gains during her rehab stay and is modified independent at discharge.  Supervision is recommended with cognitive task and for safety.  She will continue to receive follow-up outpatient PT, OT and ST at [facility] outpatient rehab after discharge.     Rehab course: During patient's stay in rehab weekly team conferences were held to monitor patient's progress, set goals and discuss barriers to discharge. At admission, patient required min assist with mobility and with ADL tasks.  She exhibited mild cognitive deficits affecting  recall, memory and executive function with SLUMS score 22/30. She  has had improvement in activity tolerance, balance, postural control as well as ability to compensate for deficits. She is able to complete ADL tasks at modified independent level. She is independent for transfers and is able to ambulate 150' without AD. She is able to climb 12 stairs independently. She requires supervision with functional and complex cognitive tasks. Her SLUMS score has improved to 26/30. Family education has been completed.    Disposition: Home  PAIN:  Are you having pain? No  PRECAUTIONS: Fall  WEIGHT BEARING RESTRICTIONS No  FALLS: Has patient fallen in last 6 months? Yes. Number of falls 1  LIVING ENVIRONMENT: Lives with: lives with  their spouse Lives in: House/apartment Stairs: 4 STE B rails, home is handicap accessible except for bathtub  Has following equipment at home: None  PLOF: Independent and Independent with basic ADLs  PATIENT GOALS build strength and balance, endurance   OBJECTIVE:   DIAGNOSTIC FINDINGS:   COGNITION: Overall cognitive status: Impaired- memory issues after acute medical problem    SENSATION: Not tested   LOWER EXTREMITY ROM:     Active  Right Eval Left Eval  Hip flexion    Hip extension    Hip abduction    Hip adduction    Hip internal rotation    Hip external rotation    Knee flexion    Knee extension    Ankle dorsiflexion    Ankle plantarflexion    Ankle inversion    Ankle eversion     (Blank rows = not tested)  LOWER EXTREMITY MMT:    MMT Right Eval Left Eval  Hip flexion 3 3  Hip extension 2 2  Hip abduction 3 4-  Hip adduction    Hip internal rotation    Hip external rotation    Knee flexion 4 4  Knee extension 4 4  Ankle dorsiflexion 3 3  Ankle plantarflexion    Ankle inversion    Ankle eversion    (Blank rows = not tested)  BED MOBILITY:  Sit to supine Complete Independence Supine to sit Complete Independence Rolling to  Right Complete Independence Rolling to Left Complete Independence  TRANSFERS: Assistive device utilized: None  Sit to stand: Complete Independence Stand to sit: Complete Independence Chair to chair: Complete Independence Floor:    GAIT: Gait pattern: WFL and trendelenburg Distance walked: 659f  Assistive device utilized: None Level of assistance: Complete Independence Comments: WNL   FUNCTIONAL TESTs:  Berg Balance Scale: 42/56 2MWT [id number]  TODAY'S TREATMENT:  02/16/22 NuStep L5 x6m55m Progress note- rechecked goals, BERG  Elliptical L2 x4 mins Resisted gait with step up 6"  Leg ext 15# 2x10 HS curls 35# 2x10  02/13/22 NuStep L5 x 6 minutes. Supine bridge with pelvic tilt, SLR on R, rolling supine<> L sidelie, holding R arm and leg in the air to engage trunk. 10 reps Sit to stand with red Tband around knees, 2# weight, 10 reps B side step against Red Tband at knees, 2 x 10 each direction Heel raises on step with BUE support 2 x 10 reps  02/07/22 Ask about return to work, and goals Endurance training 5 mins on each machine NuStep L5, UBE L2, treadmill 2% 2mp77mtanding on airex feet together hitting ball S2S on airex 2x10 Mini squats on BOSO on blue side 2x10    02/01/22 Treadmill 1.5% 1.5mph39mmins56mke L4 x 5mins 78msisted gait 30# 4 way x4  Rows and Lats 20# 2x10 Lateral step ups on BOSU SL on firm 7s at best  8" step up from airex  Leg press 40# 2x10  01/30/22 NuStep L5 x6mins  29miptical L2 x4 mins  Leg ext 10# 2x10, LLE 5# x5 Hs curls 25# 2x10, 15# x10 LLE  mini squats on BOSU 2x10  Ladder drills for coordination  Tandem walking  01/25/22 NuStep L5 x6mins  R66msted side steps over obstacles 5x each way  Tandem on airex catch Step up on BOSU  Leg press 40# 2x10 Calf raises on leg press 2x12 Shoulder ext 10# 2x10  01/23/22 Bike L4 x5mins  Wa87mng outdoors  Lateral step ups 6" Shoulder ext 10# 2x10 Cable rows  10# 2x10 Standing on airex  hitting ball feet together, half tandem  Leg ext 5# 2x10 HS curls 25# 2x10 STS on airex 2x10   01/18/22 NuStep L6 x41mns  Walking on beam narrow steps and side steps  Side steps on airex  Lateral band walks greenTB  Calf raises on black bar 2x10 Leg press 20# x10, 30# x10 Shoulder rows and ext greenTB 2x10  01/16/22 NuStep L5 x669ms Bridges 2x10 SLR 2# 2x10 STS w/OHP x10, x10 with push out  Step ups 6"  Leg ext 5# 2x10 HS curls 25# 2x10  Resisted gait 20# 4 way x4   01/12/22 Nustep L4 x6 minutes  Bridges x5  Sidelying hip ABD x5 B   PATIENT EDUCATION: Education details: exam findings, POC, HEP  Person educated: Patient Education method: ExConsulting civil engineerDeMedia plannerand Handouts Education comprehension: verbalized understanding and returned demonstration   HOME EXERCISE PROGRAM: [id number]    GOALS: Goals reviewed with patient? Yes  SHORT TERM GOALS: Target date: 01/30/2022  Will be compliant with appropriate progressive HEP  Baseline: Goal status: MET  2.  Will be able to name at least 3 ways to reduce fall risk at home and in community  Baseline:  Goal status: MET  3.  Will tolerate standing tasks for at least 90 minutes without increase in fatigue  Baseline:  Goal status: IN PROGRESS  4.  Will be able to walk for at least 15 minutes without rest to show improved functional activity tolerance  Baseline: 10 mins- 11/2 Goal status: IN PROGRESS    LONG TERM GOALS: Target date: 02/27/2022  MMT to improve by at least 1 grade in all weak groups  Baseline:  Goal status: ongoing  2.  Will score at least 50 on the Berg to show improved functional balance  Baseline: 53- 11/2 Goal status: MET  3.  Will tolerate at least 1/2 day at work without fatigue to show improved functional activity tolerance  Goal status: MET  4.  Will be independent with appropriate gym based exercise program to maintain functional gains and continue progress on an independent  basis  Baseline:  Goal status: INITIAL   ASSESSMENT:  CLINICAL IMPRESSION: Patient arrived late to appt. She reports continued fatigue at work and SOB with activity. Progress note complete, she has met most of her goals. She tolerated all activities, but was fatigued at the end of treatment.  OBJECTIVE IMPAIRMENTS cardiopulmonary status limiting activity, decreased activity tolerance, decreased balance, decreased cognition, decreased mobility, difficulty walking, decreased strength, and improper body mechanics.   ACTIVITY LIMITATIONS standing, squatting, stairs, transfers, bed mobility, locomotion level, and caring for others  PARTICIPATION LIMITATIONS: community activity, occupation, and yard work  PERSONAL FACTORS Age, Fitness, Past/current experiences, and Time since onset of injury/illness/exacerbation are also affecting patient's functional outcome.   REHAB POTENTIAL: Good  CLINICAL DECISION MAKING: Evolving/moderate complexity  EVALUATION COMPLEXITY: Moderate  PLAN: PT FREQUENCY: 2x/week  PT DURATION: 8 weeks  PLANNED INTERVENTIONS: Therapeutic exercises, Therapeutic activity, Neuromuscular re-education, Balance training, Gait training, Patient/Family education, Self Care, Joint mobilization, Stair training, Dry Needling, Cognitive remediation, Cryotherapy, Moist heat, Taping, Ultrasound, Ionotophoresis 35m36ml Dexamethasone, Manual therapy, and Re-evaluation  PLAN FOR NEXT SESSION: Assess tolerance to HEP, PN-re-assessment  SusEthel RanaT 02/16/22 6:02 PM  02/16/2022, 6:02 PM

## 2022-02-20 ENCOUNTER — Encounter: Payer: BC Managed Care – PPO | Admitting: Physical Medicine and Rehabilitation

## 2022-02-21 ENCOUNTER — Ambulatory Visit: Payer: BC Managed Care – PPO

## 2022-02-21 DIAGNOSIS — R278 Other lack of coordination: Secondary | ICD-10-CM | POA: Diagnosis not present

## 2022-02-21 DIAGNOSIS — R2681 Unsteadiness on feet: Secondary | ICD-10-CM

## 2022-02-21 DIAGNOSIS — M6281 Muscle weakness (generalized): Secondary | ICD-10-CM

## 2022-02-21 NOTE — Therapy (Signed)
OUTPATIENT PHYSICAL THERAPY NEURO TREATMENT   Patient Name: Megan Reid MRN: [id number] DOB:04-25-1965, 56 y.o., female Today's Date: 02/21/2022   PCP: Leeroy Cha  REFERRING PROVIDER: Gertie Gowda, DO    PT End of Session - 02/21/22 1716     Visit Number 11    Date for PT Re-Evaluation 02/27/22    PT Start Time 1716    PT Stop Time 1800    PT Time Calculation (min) 44 min    Activity Tolerance Patient tolerated treatment well;Patient limited by fatigue    Behavior During Therapy [facility] for tasks assessed/performed               Past Medical History:  Diagnosis Date   Arthralgia 02/14/2016   Asthma    Autoimmune disease ([place]) 02/14/2016   Positive RF 139 CCP Negative  Positive ANA 1:640 Positive Ro Positive La Elevated ESR 105 - Sjorgen's Disease   DDD (degenerative disc disease), lumbar 02/14/2016   Fatigue 02/14/2016   Gastritis 02/14/2016   Hot flashes    Migraines 02/14/2016   Osteoarthritis of both hands 02/14/2016   Osteoarthritis of both knees 02/14/2016   Vitamin D deficiency 02/14/2016   Past Surgical History:  Procedure Laterality Date   BREAST BIOPSY Left    ROTATOR CUFF REPAIR Right 06/16/2019   TUBAL LIGATION     Patient Active Problem List   Diagnosis Date Noted   Decreased mobility and endurance 01/25/2022   Difficulty sleeping 01/25/2022   Dairy product intolerance 01/25/2022   Mild cognitive impairment 01/25/2022   Paresthesia of left leg 01/25/2022   Anemia 12/20/2021   Abnormal LFTs 84/06/7541   Toxic metabolic encephalopathy 60/67/7034   Thrombocytopenia ([place]) 12/04/2021   Seasonal and perennial allergic rhinitis 12/02/2020   Chronic coughing 12/02/2020   Sjogren's syndrome ([place]) 12/02/2020   Leukopenia 12/02/2020   Adverse reaction to food, subsequent encounter 06/23/2020   Heartburn 06/23/2020   Bruising 06/23/2020   Abrasion of right ankle 02/13/2017   Rheumatoid factor positive 09/01/2016   Leucopenia  09/01/2016   Autoimmune disease ([place]) 02/14/2016   Fatigue 02/14/2016   DDD (degenerative disc disease), lumbar 02/14/2016   Osteoarthritis of both knees 02/14/2016   Osteoarthritis of both hands 02/14/2016   Asthma 02/14/2016   Migraines 02/14/2016   Gastritis 02/14/2016   Vitamin D deficiency 02/14/2016   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[place]) 02/14/2016   High risk medication use 02/14/2016    ONSET DATE: 12/21/2021   REFERRING DIAG: G92.8 (KBT-24-EL) - Toxic metabolic encephalopathy   THERAPY DIAG:  Other lack of coordination  Unsteadiness on feet  Muscle weakness (generalized)  Rationale for Evaluation and Treatment Rehabilitation  SUBJECTIVE:  SUBJECTIVE STATEMENT: Doing fine, still getting tired at work but slowly getting better.  Pt accompanied by: self  PERTINENT HISTORY: Brief HPI:   Megan Reid is a 56 y.o. female with history of DDD lumbar spine, asthma, autoimmune disease, Sjogren's with recent onset of trigeminal neuralgia and was started on Tegretol.  She was also reported to have been taking ibuprofen as well as Tylenol for pain management.  She was found to have metabolic encephalopathy with acute hepatic and renal failure.  Renal failure treated with IV fluids and bicarb .  AKI felt to be due to ATN from acute illness, diarrhea and ibuprofen use.  She developed fever past admission and was treated with IV antibiotics due to concerns of sepsis.  Work-up was negative for DR ESS, tickborne illness, CMV, EBV and acute hepatitis/pancytopenia felt to be secondary to Tegretol toxicity.  She did not have pancytopenia and supportive care was recommended by heme-onc.  Renal status and abnormal LFTs were resolving however patient continued limited by weakness, DOE as  well as encephalopathy with delayed processing.  CIR was recommended due to functional decline.     Hospital Course: ANALEE MONTEE was admitted to rehab 12/12/2021 for inpatient therapies to consist of PT, ST and OT at least three hours five days a week. Past admission physiatrist, therapy team and rehab RN have worked together to provide customized collaborative inpatient rehab.  Her blood pressures were monitored on TID basis and has been stable.  Follow-up check of electrolytes revealed renal status as well as abnormal LFTs to be gradually improving.  She was found to have drop in magnesium to 1.3 requiring IV supplementation as well as p.o. supplement.  Repeat check showed improvement with magnesium of 2.0.  Recommend repeat check in 1 to 2 weeks to monitor for stability as well as need to continue oral supplementation.   Follow-up CBC showed improvement in white count with stable H&H and that thrombocytopenia had resolved.  Her alertness and mentation has gradually improved with improvement in activity tolerance and respiratory status.  Hydroxyzine 10 mg 3 times daily as needed was added to help manage anxiety.  Sleep-wake disruption is improved with addition of melatonin.  Constipation has resolved with augmentation of bowel regimen.  She has made steady gains during her rehab stay and is modified independent at discharge.  Supervision is recommended with cognitive task and for safety.  She will continue to receive follow-up outpatient PT, OT and ST at [facility] outpatient rehab after discharge.     Rehab course: During patient's stay in rehab weekly team conferences were held to monitor patient's progress, set goals and discuss barriers to discharge. At admission, patient required min assist with mobility and with ADL tasks.  She exhibited mild cognitive deficits affecting recall, memory and executive function with SLUMS score 22/30. She  has had improvement in activity tolerance, balance, postural  control as well as ability to compensate for deficits. She is able to complete ADL tasks at modified independent level. She is independent for transfers and is able to ambulate 150' without AD. She is able to climb 12 stairs independently. She requires supervision with functional and complex cognitive tasks. Her SLUMS score has improved to 26/30. Family education has been completed.    Disposition: Home  PAIN:  Are you having pain? No  PRECAUTIONS: Fall  WEIGHT BEARING RESTRICTIONS No  FALLS: Has patient fallen in last 6 months? Yes. Number of falls 1  LIVING ENVIRONMENT: Lives with: lives  with their spouse Lives in: House/apartment Stairs: 4 STE B rails, home is handicap accessible except for bathtub  Has following equipment at home: None  PLOF: Independent and Independent with basic ADLs  PATIENT GOALS build strength and balance, endurance   OBJECTIVE:   DIAGNOSTIC FINDINGS:   COGNITION: Overall cognitive status: Impaired- memory issues after acute medical problem    SENSATION: Not tested   LOWER EXTREMITY ROM:     Active  Right Eval Left Eval  Hip flexion    Hip extension    Hip abduction    Hip adduction    Hip internal rotation    Hip external rotation    Knee flexion    Knee extension    Ankle dorsiflexion    Ankle plantarflexion    Ankle inversion    Ankle eversion     (Blank rows = not tested)  LOWER EXTREMITY MMT:    MMT Right Eval Left Eval  Hip flexion 3 3  Hip extension 2 2  Hip abduction 3 4-  Hip adduction    Hip internal rotation    Hip external rotation    Knee flexion 4 4  Knee extension 4 4  Ankle dorsiflexion 3 3  Ankle plantarflexion    Ankle inversion    Ankle eversion    (Blank rows = not tested)  BED MOBILITY:  Sit to supine Complete Independence Supine to sit Complete Independence Rolling to Right Complete Independence Rolling to Left Complete Independence  TRANSFERS: Assistive device utilized: None  Sit to stand:  Complete Independence Stand to sit: Complete Independence Chair to chair: Complete Independence Floor:    GAIT: Gait pattern: WFL and trendelenburg Distance walked: 651f  Assistive device utilized: None Level of assistance: Complete Independence Comments: WNL   FUNCTIONAL TESTs:  Berg Balance Scale: 42/56 2MWT 6724f  TODAY'S TREATMENT:  02/21/22 Walk outside Bike L4 x4 mins with power bursts x2  Hip abd/ext 5# 2x10 Standing on airex feet together catch red ball, tried SL but too difficult pt states it is bc she is tired Rows and Lats 20# 2x10  02/16/22 NuStep L5 x6m12m Progress note- rechecked goals, BERG  Elliptical L2 x4 mins Resisted gait with step up 6"  Leg ext 15# 2x10 HS curls 35# 2x10  02/13/22 NuStep L5 x 6 minutes. Supine bridge with pelvic tilt, SLR on R, rolling supine<> L sidelie, holding R arm and leg in the air to engage trunk. 10 reps Sit to stand with red Tband around knees, 2# weight, 10 reps B side step against Red Tband at knees, 2 x 10 each direction Heel raises on step with BUE support 2 x 10 reps  02/07/22 Ask about return to work, and goals Endurance training 5 mins on each machine NuStep L5, UBE L2, treadmill 2% 2mp56mtanding on airex feet together hitting ball S2S on airex 2x10 Mini squats on BOSO on blue side 2x10    02/01/22 Treadmill 1.5% 1.5mph30mmins64mke L4 x 5mins 48msisted gait 30# 4 way x4  Rows and Lats 20# 2x10 Lateral step ups on BOSU SL on firm 7s at best  8" step up from airex  Leg press 40# 2x10  01/30/22 NuStep L5 x6mins  1miptical L2 x4 mins  Leg ext 10# 2x10, LLE 5# x5 Hs curls 25# 2x10, 15# x10 LLE  mini squats on BOSU 2x10  Ladder drills for coordination  Tandem walking  01/25/22 NuStep L5 x6mins  R2msted side steps over obstacles 5x each  way  Tandem on airex catch Step up on BOSU  Leg press 40# 2x10 Calf raises on leg press 2x12 Shoulder ext 10# 2x10  01/23/22 Bike L4 x65mns  Walking outdoors   Lateral step ups 6" Shoulder ext 10# 2x10 Cable rows 10# 2x10 Standing on airex hitting ball feet together, half tandem  Leg ext 5# 2x10 HS curls 25# 2x10 STS on airex 2x10   01/18/22 NuStep L6 x572ms  Walking on beam narrow steps and side steps  Side steps on airex  Lateral band walks greenTB  Calf raises on black bar 2x10 Leg press 20# x10, 30# x10 Shoulder rows and ext greenTB 2x10  01/16/22 NuStep L5 x6m5m Bridges 2x10 SLR 2# 2x10 STS w/OHP x10, x10 with push out  Step ups 6"  Leg ext 5# 2x10 HS curls 25# 2x10  Resisted gait 20# 4 way x4   01/12/22 Nustep L4 x6 minutes  Bridges x5  Sidelying hip ABD x5 B   PATIENT EDUCATION: Education details: exam findings, POC, HEP  Person educated: Patient Education method: ExpConsulting civil engineeremMedia plannernd Handouts Education comprehension: verbalized understanding and returned demonstration   HOME EXERCISE PROGRAM: [id number]    GOALS: Goals reviewed with patient? Yes  SHORT TERM GOALS: Target date: 01/30/2022  Will be compliant with appropriate progressive HEP  Baseline: Goal status: MET  2.  Will be able to name at least 3 ways to reduce fall risk at home and in community  Baseline:  Goal status: MET  3.  Will tolerate standing tasks for at least 90 minutes without increase in fatigue  Baseline:  Goal status: IN PROGRESS  4.  Will be able to walk for at least 15 minutes without rest to show improved functional activity tolerance  Baseline: 10 mins- 11/2 Goal status: IN PROGRESS    LONG TERM GOALS: Target date: 02/27/2022  MMT to improve by at least 1 grade in all weak groups  Baseline:  Goal status: ongoing  2.  Will score at least 50 on the Berg to show improved functional balance  Baseline: 53- 11/2 Goal status: MET  3.  Will tolerate at least 1/2 day at work without fatigue to show improved functional activity tolerance  Goal status: MET  4.  Will be independent with appropriate gym based  exercise program to maintain functional gains and continue progress on an independent basis  Baseline:  Goal status: INITIAL   ASSESSMENT:  CLINICAL IMPRESSION: Patient reports continued fatigue at work and SOB with activity. Able to walk around parking lot and up hill with less SOB, still fatigued but able to talk the entire time which she was unable to do in previous visits. Continued with LE strengthening and endurance, some difficulty with hip strengthening.   OBJECTIVE IMPAIRMENTS cardiopulmonary status limiting activity, decreased activity tolerance, decreased balance, decreased cognition, decreased mobility, difficulty walking, decreased strength, and improper body mechanics.   ACTIVITY LIMITATIONS standing, squatting, stairs, transfers, bed mobility, locomotion level, and caring for others  PARTICIPATION LIMITATIONS: community activity, occupation, and yard work  PERSONAL FACTORS Age, Fitness, Past/current experiences, and Time since onset of injury/illness/exacerbation are also affecting patient's functional outcome.   REHAB POTENTIAL: Good  CLINICAL DECISION MAKING: Evolving/moderate complexity  EVALUATION COMPLEXITY: Moderate  PLAN: PT FREQUENCY: 2x/week  PT DURATION: 8 weeks  PLANNED INTERVENTIONS: Therapeutic exercises, Therapeutic activity, Neuromuscular re-education, Balance training, Gait training, Patient/Family education, Self Care, Joint mobilization, Stair training, Dry Needling, Cognitive remediation, Cryotherapy, Moist heat, Taping, Ultrasound, Ionotophoresis 4mg92m Dexamethasone, Manual therapy, and Re-evaluation  PLAN FOR NEXT SESSION: Assess tolerance to HEP, PN-re-assessment  Ethel Rana DPT 02/21/22 5:59 PM  02/21/2022, 5:59 PM

## 2022-02-22 ENCOUNTER — Encounter
Payer: BC Managed Care – PPO | Attending: Physical Medicine and Rehabilitation | Admitting: Physical Medicine and Rehabilitation

## 2022-02-22 ENCOUNTER — Ambulatory Visit: Payer: BC Managed Care – PPO | Admitting: Physical Medicine and Rehabilitation

## 2022-02-22 ENCOUNTER — Encounter: Payer: Self-pay | Admitting: Physical Medicine and Rehabilitation

## 2022-02-22 VITALS — BP 148/89 | HR 84 | Ht 70.0 in | Wt 166.6 lb

## 2022-02-22 DIAGNOSIS — G3184 Mild cognitive impairment, so stated: Secondary | ICD-10-CM | POA: Diagnosis not present

## 2022-02-22 DIAGNOSIS — G928 Other toxic encephalopathy: Secondary | ICD-10-CM | POA: Diagnosis not present

## 2022-02-22 DIAGNOSIS — R202 Paresthesia of skin: Secondary | ICD-10-CM | POA: Diagnosis not present

## 2022-02-22 MED ORDER — GABAPENTIN 100 MG PO CAPS: 100.0000 mg | ORAL_CAPSULE | Freq: Every day | ORAL | 5 refills | Status: DC
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
## 2022-02-22 NOTE — Assessment & Plan Note (Signed)
If you need additional documentation for work accomodations, message me through [place] and we will get those submitted.   You are doing very well with adaptive strategies returning to work and dealing with stressors that come with that. Keep it up!  You can follow up with me in 3 months to re-evaluate work needs, or sooner with one of my colleagues.

## 2022-02-22 NOTE — Patient Instructions (Signed)
Today, you were prescribed gabapentin 100 mg capsules. Take one capsule at nighttime for your neuropathic pains and left leg sensitivity. If it makes you groggy in the morning, you can stop the medication and call our office to discuss different options.   Talk to your PT about incorporating stretches for your neuropathy.   If you need additional documentation for work accomodations, message me through [place] and we will get those submitted.   You are doing very well with adaptive strategies returning to work and dealing with stressors that come with that. Keep it up!  You can follow up with me in 3 months to re-evaluate work needs, or sooner with one of my colleagues.

## 2022-02-22 NOTE — Assessment & Plan Note (Signed)
Today, you were prescribed gabapentin 100 mg capsules. Take one capsule at nighttime for your neuropathic pains and left leg sensitivity. If it makes you groggy in the morning, you can stop the medication and call our office to discuss different options.   Talk to your PT about incorporating stretches for your neuropathy.   Repeat EMG discussed; will defer at this time

## 2022-02-22 NOTE — Assessment & Plan Note (Signed)
Improving; continue OP SLP and work accommodations as above.   Provided disability documentation; available at front desk for pickup.

## 2022-02-23 ENCOUNTER — Ambulatory Visit: Payer: BC Managed Care – PPO

## 2022-02-23 DIAGNOSIS — R2681 Unsteadiness on feet: Secondary | ICD-10-CM

## 2022-02-23 DIAGNOSIS — R278 Other lack of coordination: Secondary | ICD-10-CM

## 2022-02-23 DIAGNOSIS — M6281 Muscle weakness (generalized): Secondary | ICD-10-CM

## 2022-02-23 NOTE — Therapy (Signed)
OUTPATIENT PHYSICAL THERAPY NEURO TREATMENT   Patient Name: Megan Reid MRN: [id number] DOB:[birth year], 56 y.o., female Today's Date: 02/21/2022   PCP: Leeroy Cha  REFERRING PROVIDER: Gertie Gowda, DO    PT End of Session - 02/21/22 1716     Visit Number 11    Date for PT Re-Evaluation 02/27/22    PT Start Time 1716    PT Stop Time 1800    PT Time Calculation (min) 44 min    Activity Tolerance Patient tolerated treatment well;Patient limited by fatigue    Behavior During Therapy [facility] for tasks assessed/performed               Past Medical History:  Diagnosis Date   Arthralgia 02/14/2016   Asthma    Autoimmune disease ([place]) 02/14/2016   Positive RF 139 CCP Negative  Positive ANA 1:640 Positive Ro Positive [place] Elevated ESR 105 - Sjorgen's Disease   DDD (degenerative disc disease), lumbar 02/14/2016   Fatigue 02/14/2016   Gastritis 02/14/2016   Hot flashes    Migraines 02/14/2016   Osteoarthritis of both hands 02/14/2016   Osteoarthritis of both knees 02/14/2016   Vitamin D deficiency 02/14/2016   Past Surgical History:  Procedure Laterality Date   BREAST BIOPSY Left    ROTATOR CUFF REPAIR Right 06/16/2019   TUBAL LIGATION     Patient Active Problem List   Diagnosis Date Noted   Decreased mobility and endurance 01/25/2022   Difficulty sleeping 01/25/2022   Dairy product intolerance 01/25/2022   Mild cognitive impairment 01/25/2022   Paresthesia of left leg 01/25/2022   Anemia 12/20/2021   Abnormal LFTs 02/13/1313   Toxic metabolic encephalopathy 38/88/7579   Thrombocytopenia ([place]) 12/04/2021   Seasonal and perennial allergic rhinitis 12/02/2020   Chronic coughing 12/02/2020   Sjogren's syndrome ([place]) 12/02/2020   Leukopenia 12/02/2020   Adverse reaction to food, subsequent encounter 06/23/2020   Heartburn 06/23/2020   Bruising 06/23/2020   Abrasion of right ankle 02/13/2017   Rheumatoid factor positive 09/01/2016   Leucopenia  09/01/2016   Autoimmune disease ([place]) 02/14/2016   Fatigue 02/14/2016   DDD (degenerative disc disease), lumbar 02/14/2016   Osteoarthritis of both knees 02/14/2016   Osteoarthritis of both hands 02/14/2016   Asthma 02/14/2016   Migraines 02/14/2016   Gastritis 02/14/2016   Vitamin D deficiency 02/14/2016   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[place]) 02/14/2016   High risk medication use 02/14/2016    ONSET DATE: 12/21/2021   REFERRING DIAG: G92.8 ([id number]) - Toxic metabolic encephalopathy   THERAPY DIAG:  Other lack of coordination  Unsteadiness on feet  Muscle weakness (generalized)  Rationale for Evaluation and Treatment Rehabilitation  SUBJECTIVE:  SUBJECTIVE STATEMENT: I am good, 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   Pt accompanied by: self  PERTINENT HISTORY: Brief HPI:   Megan Reid is a 57 y.o. female with history of DDD lumbar spine, asthma, autoimmune disease, Sjogren's with recent onset of trigeminal neuralgia and was started on Tegretol.  She was also reported to have been taking ibuprofen as well as Tylenol for pain management.  She was found to have metabolic encephalopathy with acute hepatic and renal failure.  Renal failure treated with IV fluids and bicarb .  AKI felt to be due to ATN from acute illness, diarrhea and ibuprofen use.  She developed fever past admission and was treated with IV antibiotics due to concerns of sepsis.  Work-up was negative for DR ESS, tickborne illness, CMV, EBV and acute hepatitis/pancytopenia felt to be secondary to Tegretol toxicity.  She did not have pancytopenia and supportive care was recommended by heme-onc.  Renal status and abnormal LFTs were resolving however patient continued limited by  weakness, DOE as well as encephalopathy with delayed processing.  CIR was recommended due to functional decline.     Hospital Course: Megan Reid was admitted to rehab 12/12/2021 for inpatient therapies to consist of PT, ST and OT at least three hours five days a week. Past admission physiatrist, therapy team and rehab RN have worked together to provide customized collaborative inpatient rehab.  Her blood pressures were monitored on TID basis and has been stable.  Follow-up check of electrolytes revealed renal status as well as abnormal LFTs to be gradually improving.  She was found to have drop in magnesium to 1.3 requiring IV supplementation as well as p.o. supplement.  Repeat check showed improvement with magnesium of 2.0.  Recommend repeat check in 1 to 2 weeks to monitor for stability as well as need to continue oral supplementation.   Follow-up CBC showed improvement in white count with stable H&H and that thrombocytopenia had resolved.  Her alertness and mentation has gradually improved with improvement in activity tolerance and respiratory status.  Hydroxyzine 10 mg 3 times daily as needed was added to help manage anxiety.  Sleep-wake disruption is improved with addition of melatonin.  Constipation has resolved with augmentation of bowel regimen.  She has made steady gains during her rehab stay and is modified independent at discharge.  Supervision is recommended with cognitive task and for safety.  She will continue to receive follow-up outpatient PT, OT and ST at [facility] outpatient rehab after discharge.     Rehab course: During patient's stay in rehab weekly team conferences were held to monitor patient's progress, set goals and discuss barriers to discharge. At admission, patient required min assist with mobility and with ADL tasks.  She exhibited mild cognitive deficits affecting recall, memory and executive function with SLUMS score 22/30. She  has had improvement in activity tolerance,  balance, postural control as well as ability to compensate for deficits. She is able to complete ADL tasks at modified independent level. She is independent for transfers and is able to ambulate 150' without AD. She is able to climb 12 stairs independently. She requires supervision with functional and complex cognitive tasks. Her SLUMS score has improved to 26/30. Family education has been completed.    Disposition: Home  PAIN:  Are you having pain? No  PRECAUTIONS: Fall  WEIGHT BEARING RESTRICTIONS No  FALLS: Has patient fallen in last 6 months? Yes. Number of falls 1  LIVING ENVIRONMENT: Lives with: lives with their spouse Lives in: House/apartment Stairs: 4 STE B rails, home is handicap accessible except for bathtub  Has following equipment at home: None  PLOF: Independent and Independent with basic ADLs  PATIENT GOALS build strength and balance, endurance   OBJECTIVE:   DIAGNOSTIC FINDINGS:   COGNITION: Overall cognitive status: Impaired- memory issues after acute medical problem    SENSATION: Not tested   LOWER EXTREMITY ROM:     Active  Right Eval Left Eval  Hip flexion    Hip extension    Hip abduction    Hip adduction    Hip internal rotation    Hip external rotation    Knee flexion    Knee extension    Ankle dorsiflexion    Ankle plantarflexion    Ankle inversion    Ankle eversion     (Blank rows = not tested)  LOWER EXTREMITY MMT:    MMT Right Eval Left Eval  Hip flexion 3 3  Hip extension 2 2  Hip abduction 3 4-  Hip adduction    Hip internal rotation    Hip external rotation    Knee flexion 4 4  Knee extension 4 4  Ankle dorsiflexion 3 3  Ankle plantarflexion    Ankle inversion    Ankle eversion    (Blank rows = not tested)  BED MOBILITY:  Sit to supine Complete Independence Supine to sit Complete Independence Rolling to Right Complete Independence Rolling to Left Complete Independence  TRANSFERS: Assistive device utilized:  None  Sit to stand: Complete Independence Stand to sit: Complete Independence Chair to chair: Complete Independence Floor:    GAIT: Gait pattern: WFL and trendelenburg Distance walked: 613f  Assistive device utilized: None Level of assistance: Complete Independence Comments: WNL   FUNCTIONAL TESTs:  Berg Balance Scale: 42/56 2MWT 6729f  TODAY'S TREATMENT:  1162/2/29ecert Nustep [id number] on airex with red ball toss 2x10  Leg ext 15# 2x10 HS curls 35# 2x10 Lumbar traction 85# 1065m   02/21/22 Walk outside Bike L4 x4 mins with power bursts x2  Hip abd/ext 5# 2x10 Standing on airex feet together catch red ball, tried SL but too difficult pt states it is bc she is tired Rows and Lats 20# 2x10  02/16/22 NuStep L5 x6mi89mProgress note- rechecked goals, BERG  Elliptical L2 x4 mins Resisted gait with step up 6"  Leg ext 15# 2x10 HS curls 35# 2x10  02/13/22 NuStep L5 x 6 minutes. Supine bridge with pelvic tilt, SLR on R, rolling supine<> L sidelie, holding R arm and leg in the air to engage trunk. 10 reps Sit to stand with red Tband around knees, 2# weight, 10 reps B side step against Red Tband at knees, 2 x 10 each direction Heel raises on step with BUE support 2 x 10 reps  02/07/22 Ask about return to work, and goals Endurance training 5 mins on each machine NuStep L5, UBE L2, treadmill 2% 2mph59manding on airex feet together hitting ball S2S on airex 2x10 Mini squats on BOSO on blue side 2x10    02/01/22 Treadmill 1.5% 1.5mph 81mins 72me L4 x 5mins  22misted gait 30# 4 way x4  Rows and Lats 20# 2x10 Lateral step ups on BOSU SL on firm 7s at best  8" step up from airex  Leg press 40# 2x10  01/30/22 NuStep L5 x6mins  E2mptical L2 x4 mins  Leg ext 10# 2x10, LLE 5# x5 Hs  curls 25# 2x10, 15# x10 LLE  mini squats on BOSU 2x10  Ladder drills for coordination  Tandem walking  01/25/22 NuStep L5 x35mns  Resisted side steps over obstacles 5x each way   Tandem on airex catch Step up on BOSU  Leg press 40# 2x10 Calf raises on leg press 2x12 Shoulder ext 10# 2x10  01/23/22 Bike L4 x548ms  Walking outdoors  Lateral step ups 6" Shoulder ext 10# 2x10 Cable rows 10# 2x10 Standing on airex hitting ball feet together, half tandem  Leg ext 5# 2x10 HS curls 25# 2x10 STS on airex 2x10   01/18/22 NuStep L6 x5m34m  Walking on beam narrow steps and side steps  Side steps on airex  Lateral band walks greenTB  Calf raises on black bar 2x10 Leg press 20# x10, 30# x10 Shoulder rows and ext greenTB 2x10  01/16/22 NuStep L5 x6mi95mBridges 2x10 SLR 2# 2x10 STS w/OHP x10, x10 with push out  Step ups 6"  Leg ext 5# 2x10 HS curls 25# 2x10  Resisted gait 20# 4 way x4   01/12/22 Nustep L4 x6 minutes  Bridges x5  Sidelying hip ABD x5 B   PATIENT EDUCATION: Education details: exam findings, POC, HEP  Person educated: Patient Education method: ExplConsulting civil engineermoMedia plannerd Handouts Education comprehension: verbalized understanding and returned demonstration   HOME EXERCISE PROGRAM: [id number]    GOALS: Goals reviewed with patient? Yes  SHORT TERM GOALS: Target date: 01/30/2022  Will be compliant with appropriate progressive HEP  Baseline: Goal status: MET  2.  Will be able to name at least 3 ways to reduce fall risk at home and in community  Baseline:  Goal status: MET  3.  Will tolerate standing tasks for at least 90 minutes without increase in fatigue  Baseline:  Goal status: IN PROGRESS  4.  Will be able to walk for at least 15 minutes without rest to show improved functional activity tolerance  Baseline: 10 mins- 11/2 Goal status: IN PROGRESS    LONG TERM GOALS: Target date: 02/27/2022  MMT to improve by at least 1 grade in all weak groups  Baseline:  Goal status: ongoing  2.  Will score at least 50 on the Berg to show improved functional balance  Baseline: 53- 11/2 Goal status: MET  3.  Will tolerate at  least 1/2 day at work without fatigue to show improved functional activity tolerance  Goal status: MET  4.  Will be independent with appropriate gym based exercise program to maintain functional gains and continue progress on an independent basis  Baseline:  Goal status: IN PROGRESS   ASSESSMENT:  CLINICAL IMPRESSION: Patient arrives a few mins late. States she saw her doctor yesterday and they told her she had neuropathy. She was seeing PT for this before her hospital encounter and states her PT did lots of good stretches and manual traction and it helped with her numbness. We tried lumbar traction today to see if that has a similar effect. Recert for a few more weeks to keep working on her endurance and decreasing fatigue.   OBJECTIVE IMPAIRMENTS cardiopulmonary status limiting activity, decreased activity tolerance, decreased balance, decreased cognition, decreased mobility, difficulty walking, decreased strength, and improper body mechanics.   ACTIVITY LIMITATIONS standing, squatting, stairs, transfers, bed mobility, locomotion level, and caring for others  PARTICIPATION LIMITATIONS: community activity, occupation, and yard work  PERSONAL FACTORS Age, Fitness, Past/current experiences, and Time since onset of injury/illness/exacerbation are also affecting patient's functional outcome.   REHAB POTENTIAL:  Good  CLINICAL DECISION MAKING: Evolving/moderate complexity  EVALUATION COMPLEXITY: Moderate  PLAN: PT FREQUENCY: 2x/week  PT DURATION: 8 weeks  PLANNED INTERVENTIONS: Therapeutic exercises, Therapeutic activity, Neuromuscular re-education, Balance training, Gait training, Patient/Family education, Self Care, Joint mobilization, Stair training, Dry Needling, Cognitive remediation, Cryotherapy, Moist heat, Taping, Ultrasound, Ionotophoresis 1m/ml Dexamethasone, Manual therapy, and Re-evaluation  PLAN FOR NEXT SESSION: Assess tolerance to HEP, PN-re-assessment  MAndris Baumann DPT   02/21/22 5:59 PM  02/21/2022, 5:59 PM

## 2022-02-28 ENCOUNTER — Ambulatory Visit: Payer: BC Managed Care – PPO

## 2022-03-02 ENCOUNTER — Ambulatory Visit: Payer: BC Managed Care – PPO

## 2022-03-02 DIAGNOSIS — R2681 Unsteadiness on feet: Secondary | ICD-10-CM

## 2022-03-02 DIAGNOSIS — R278 Other lack of coordination: Secondary | ICD-10-CM

## 2022-03-02 DIAGNOSIS — M6281 Muscle weakness (generalized): Secondary | ICD-10-CM

## 2022-03-02 NOTE — Progress Notes (Deleted)
Follow Up Note  RE: EYMI LIPUMA MRN: [id number] DOB: 06/14/1965 Date of Office Visit: 03/03/2022  Referring provider: [name],* Primary care provider: [name], MD  Chief Complaint: No chief complaint on file.  History of Present Illness: I had the pleasure of seeing Megan Reid for a follow up visit at the [facility] on 03/02/2022. She is a 56 y.o. female, who is being followed for allergic rhinitis, heartburn, chronic coughing, asthma. Her previous allergy office visit was on 06/27/2021 with Dr. Selena Batten. Today is a regular follow up visit.  Seasonal and perennial allergic rhinitis Past history - Perennial rhinitis symptoms for 2 years with worsening in the winter.  Tried Flonase, Singulair and Zyrtec with some benefit.  Testing over 10 years ago was positive to grass per patient report.  No prior allergy immunotherapy. Elevated IgE level - repeat higher but drawn while on Xolair. 2022 skin testing showed: Negative to indoor/outdoor allergens. 2022 bloodwork positive to dust mites. Interim history - saw ENT once but had to cancel follow up due to Covid-19 infection. Unable to tolerate full dose Flonase.  Continue environmental control measures.  Use Flonase (fluticasone) nasal spray 1 spray per nostril once a day as needed for nasal congestion.  Continue Singulair (montelukast) 10mg  daily at night. No antihistamines due to patient's Sjogren's disease.    Heartburn No worsening symptoms with once a day PPI. Continue Protonix 40mg  once a day and nothing to eat/drink for 30 minutes afterwards. Continue lifestyle and dietary modifications as below.   Chronic coughing Past history - Medical history significant for Sjogren's, asthma, reflux, and allergic rhinitis.  Interim history - unchanged, had Covid-19 in November 2022, saw ENT once - ? Nodule on throat? Not sure what's causing the coughing - most likely multifactorial.  Follow up with  pulmonologist as scheduled. Follow up with your ENT - regarding vocal cords.  Recommend referral to speech therapy for coughing.    Asthma Past history - Managed by pulmonology and on Xolair 375mg  every 2 weeks. Interim history - unchanged.  Today's spirometry showed moderate obstruction.  Continue follow up with pulmonology. Daily controller medication(s): Symbicort 2 puffs twice a day with spacer and rinse mouth afterwards. Continue Spiriva 2.83mcg 2 puffs once a day. Continue Xolair 375mg  every 2 week injections as per pulmonology.  Think about switching to Tezspire - prefers at home injection.  May use albuterol rescue inhaler 2 puffs every 4 to 6 hours as needed for shortness of breath, chest tightness, coughing, and wheezing. May use albuterol rescue inhaler 2 puffs 5 to 15 minutes prior to strenuous physical activities. Monitor frequency of use.    Return in about 6 months (around 12/28/2021).  Assessment and Plan: Megan Reid is a 56 y.o. female with: No problem-specific Assessment & Plan notes found for this encounter.  No follow-ups on file.  No orders of the defined types were placed in this encounter.  Lab Orders  No laboratory test(s) ordered today    Diagnostics: Spirometry:  Tracings reviewed. Her effort: {Blank single:19197::"Good reproducible efforts.","It was hard to get consistent efforts and there is a question as to whether this reflects a maximal maneuver.","Poor effort, data can not be interpreted."} FVC: ***L FEV1: ***L, ***% predicted FEV1/FVC ratio: ***% Interpretation: {Blank single:19197::"Spirometry consistent with mild obstructive disease","Spirometry consistent with moderate obstructive disease","Spirometry consistent with severe obstructive disease","Spirometry consistent with possible restrictive disease","Spirometry consistent with mixed obstructive and restrictive disease","Spirometry uninterpretable due to technique","Spirometry consistent with  normal  pattern","No overt abnormalities noted given today's efforts"}.  Please see scanned spirometry results for details.  Skin Testing: {Blank single:19197::"Select foods","Environmental allergy panel","Environmental allergy panel and select foods","Food allergy panel","None","Deferred due to recent antihistamines use"}. *** Results discussed with patient/family.   Medication List:  Current Outpatient Medications  Medication Sig Dispense Refill  . albuterol (PROVENTIL HFA;VENTOLIN HFA) 108 (90 Base) MCG/ACT inhaler Inhale 2 puffs into the lungs 2 (two) times daily. For shortness of breath 18 g 0  . fluticasone (FLONASE) 50 MCG/ACT nasal spray Place 2 sprays into both nostrils daily. 16 g 2  . gabapentin (NEURONTIN) 100 MG capsule Take 1 capsule (100 mg total) by mouth at bedtime. 30 capsule 5  . hydrOXYzine (ATARAX) 10 MG tablet Take 1 tablet (10 mg total) by mouth 3 (three) times daily as needed for anxiety. 30 tablet 0  . melatonin 5 MG TABS Take 1 tablet (5 mg total) by mouth at bedtime as needed. 30 tablet 0  . metoprolol tartrate (LOPRESSOR) 25 MG tablet Take 1 tablet (25 mg total) by mouth 2 (two) times daily. 60 tablet 0  . montelukast (SINGULAIR) 10 MG tablet Take 1 tablet (10 mg total) by mouth at bedtime. 30 tablet 5  . Multiple Vitamin (MULTIVITAMIN WITH MINERALS) TABS tablet Take 1 tablet by mouth daily.    . pantoprazole (PROTONIX) 40 MG tablet Take 1 tablet (40 mg total) by mouth daily. 30 tablet 5  . polyethylene glycol (MIRALAX / GLYCOLAX) 17 g packet Take 17 g by mouth every other day. 14 each 0  . Spacer/Aero-Holding Chambers (AEROCHAMBER PLUS) inhaler Use as instructed 2 each 2  . SYMBICORT 160-4.5 MCG/ACT inhaler INHALE 2 PUFFS INTO THE LUNGS TWICE DAILY (Patient taking differently: Inhale 2 puffs into the lungs in the morning and at bedtime.) 10.2 g 5   No current facility-administered medications for this visit.   Allergies: Allergies  Allergen Reactions  . Sulfa  Antibiotics Anaphylaxis, Hives and Swelling    Swelling of the face and tongue   . Tegretol [Carbamazepine] Other (See Comments)    Suspected hepatotoxicity and pancytopenia, required inpatient admission 11/2021   I reviewed her past medical history, social history, family history, and environmental history and no significant changes have been reported from her previous visit.  Review of Systems  Constitutional:  Negative for appetite change, fever and unexpected weight change.  HENT:  Positive for rhinorrhea. Negative for congestion.   Eyes:  Negative for itching.  Respiratory:  Positive for cough. Negative for chest tightness, shortness of breath and wheezing.   Skin:  Negative for rash.  Allergic/Immunologic: Positive for environmental allergies.  Neurological:  Negative for headaches.   Objective: LMP 05/22/2012  There is no height or weight on file to calculate BMI. Physical Exam Vitals and nursing note reviewed.  Constitutional:      Appearance: Normal appearance. She is well-developed.  HENT:     Head: Normocephalic and atraumatic.     Right Ear: Tympanic membrane and external ear normal.     Left Ear: Tympanic membrane and external ear normal.     Nose: Nose normal.     Mouth/Throat:     Mouth: Mucous membranes are dry.     Pharynx: Oropharynx is clear.  Eyes:     Conjunctiva/sclera: Conjunctivae normal.  Cardiovascular:     Rate and Rhythm: Normal rate and regular rhythm.     Heart sounds: Normal heart sounds. No murmur heard. Pulmonary:     Effort: Pulmonary effort is  normal.     Breath sounds: Normal breath sounds. No wheezing, rhonchi or rales.  Musculoskeletal:     Cervical back: Neck supple.  Skin:    General: Skin is warm.     Findings: No rash.  Neurological:     Mental Status: She is alert and oriented to person, place, and time.  Psychiatric:        Behavior: Behavior normal.  Previous notes and tests were reviewed. The plan was reviewed with the  patient/family, and all questions/concerned were addressed.  It was my pleasure to see Oretta today and participate in her care. Please feel free to contact me with any questions or concerns.  Sincerely,  Wyline Mood, DO Allergy & Immunology  [facility] office: [phone] [facility] office: [phone]

## 2022-03-02 NOTE — Therapy (Signed)
OUTPATIENT PHYSICAL THERAPY NEURO TREATMENT   Patient Name: Megan Reid MRN: [id number] DOB:November 20, 1965, 56 y.o., female Today's Date: 03/02/2022   PCP: Leeroy Cha  REFERRING PROVIDER: Gertie Gowda, DO    PT End of Session - 03/02/22 1722     Visit Number 13    Date for PT Re-Evaluation 04/20/22    PT Start Time 1721    PT Stop Time 1800    PT Time Calculation (min) 39 min    Activity Tolerance Patient tolerated treatment well;Patient limited by fatigue    Behavior During Therapy [facility] for tasks assessed/performed               Past Medical History:  Diagnosis Date   Arthralgia 02/14/2016   Asthma    Autoimmune disease ([place]) 02/14/2016   Positive RF 139 CCP Negative  Positive ANA 1:640 Positive Ro Positive [place] Elevated ESR 105 - Sjorgen's Disease   DDD (degenerative disc disease), lumbar 02/14/2016   Fatigue 02/14/2016   Gastritis 02/14/2016   Hot flashes    Migraines 02/14/2016   Osteoarthritis of both hands 02/14/2016   Osteoarthritis of both knees 02/14/2016   Vitamin D deficiency 02/14/2016   Past Surgical History:  Procedure Laterality Date   BREAST BIOPSY Left    ROTATOR CUFF REPAIR Right 06/16/2019   TUBAL LIGATION     Patient Active Problem List   Diagnosis Date Noted   Decreased mobility and endurance 01/25/2022   Difficulty sleeping 01/25/2022   Dairy product intolerance 01/25/2022   Mild cognitive impairment 01/25/2022   Paresthesia of left leg 01/25/2022   Anemia 12/20/2021   Abnormal LFTs 44/62/8638   Toxic metabolic encephalopathy 17/71/1657   Thrombocytopenia ([place]) 12/04/2021   Seasonal and perennial allergic rhinitis 12/02/2020   Chronic coughing 12/02/2020   Sjogren's syndrome ([place]) 12/02/2020   Leukopenia 12/02/2020   Adverse reaction to food, subsequent encounter 06/23/2020   Heartburn 06/23/2020   Bruising 06/23/2020   Abrasion of right ankle 02/13/2017   Rheumatoid factor positive 09/01/2016   Leucopenia  09/01/2016   Autoimmune disease ([place]) 02/14/2016   Fatigue 02/14/2016   DDD (degenerative disc disease), lumbar 02/14/2016   Osteoarthritis of both knees 02/14/2016   Osteoarthritis of both hands 02/14/2016   Asthma 02/14/2016   Migraines 02/14/2016   Gastritis 02/14/2016   Vitamin D deficiency 02/14/2016   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[place]) 02/14/2016   High risk medication use 02/14/2016    ONSET DATE: 12/21/2021   REFERRING DIAG: G92.8 ([id number]) - Toxic metabolic encephalopathy   THERAPY DIAG:  Other lack of coordination  Unsteadiness on feet  Muscle weakness (generalized)  Rationale for Evaluation and Treatment Rehabilitation  SUBJECTIVE:  SUBJECTIVE STATEMENT: I am alright, I was tired today because I had to walk a lot.   Pt accompanied by: self  PERTINENT HISTORY: Brief HPI:   Megan Reid is a 56 y.o. female with history of DDD lumbar spine, asthma, autoimmune disease, Sjogren's with recent onset of trigeminal neuralgia and was started on Tegretol.  She was also reported to have been taking ibuprofen as well as Tylenol for pain management.  She was found to have metabolic encephalopathy with acute hepatic and renal failure.  Renal failure treated with IV fluids and bicarb .  AKI felt to be due to ATN from acute illness, diarrhea and ibuprofen use.  She developed fever past admission and was treated with IV antibiotics due to concerns of sepsis.  Work-up was negative for DR ESS, tickborne illness, CMV, EBV and acute hepatitis/pancytopenia felt to be secondary to Tegretol toxicity.  She did not have pancytopenia and supportive care was recommended by heme-onc.  Renal status and abnormal LFTs were resolving however patient continued limited by weakness, DOE as well  as encephalopathy with delayed processing.  CIR was recommended due to functional decline.     Hospital Course: Megan Reid was admitted to rehab 12/12/2021 for inpatient therapies to consist of PT, ST and OT at least three hours five days a week. Past admission physiatrist, therapy team and rehab RN have worked together to provide customized collaborative inpatient rehab.  Her blood pressures were monitored on TID basis and has been stable.  Follow-up check of electrolytes revealed renal status as well as abnormal LFTs to be gradually improving.  She was found to have drop in magnesium to 1.3 requiring IV supplementation as well as p.o. supplement.  Repeat check showed improvement with magnesium of 2.0.  Recommend repeat check in 1 to 2 weeks to monitor for stability as well as need to continue oral supplementation.   Follow-up CBC showed improvement in white count with stable H&H and that thrombocytopenia had resolved.  Her alertness and mentation has gradually improved with improvement in activity tolerance and respiratory status.  Hydroxyzine 10 mg 3 times daily as needed was added to help manage anxiety.  Sleep-wake disruption is improved with addition of melatonin.  Constipation has resolved with augmentation of bowel regimen.  She has made steady gains during her rehab stay and is modified independent at discharge.  Supervision is recommended with cognitive task and for safety.  She will continue to receive follow-up outpatient PT, OT and ST at [facility] outpatient rehab after discharge.     Rehab course: During patient's stay in rehab weekly team conferences were held to monitor patient's progress, set goals and discuss barriers to discharge. At admission, patient required min assist with mobility and with ADL tasks.  She exhibited mild cognitive deficits affecting recall, memory and executive function with SLUMS score 22/30. She  has had improvement in activity tolerance, balance, postural  control as well as ability to compensate for deficits. She is able to complete ADL tasks at modified independent level. She is independent for transfers and is able to ambulate 150' without AD. She is able to climb 12 stairs independently. She requires supervision with functional and complex cognitive tasks. Her SLUMS score has improved to 26/30. Family education has been completed.    Disposition: Home  PAIN:  Are you having pain? No  PRECAUTIONS: Fall  WEIGHT BEARING RESTRICTIONS No  FALLS: Has patient fallen in last 6 months? Yes. Number of falls 1  LIVING  ENVIRONMENT: Lives with: lives with their spouse Lives in: House/apartment Stairs: 4 STE B rails, home is handicap accessible except for bathtub  Has following equipment at home: None  PLOF: Independent and Independent with basic ADLs  PATIENT GOALS build strength and balance, endurance   OBJECTIVE:   DIAGNOSTIC FINDINGS:   COGNITION: Overall cognitive status: Impaired- memory issues after acute medical problem    SENSATION: Not tested   LOWER EXTREMITY ROM:     Active  Right Eval Left Eval  Hip flexion    Hip extension    Hip abduction    Hip adduction    Hip internal rotation    Hip external rotation    Knee flexion    Knee extension    Ankle dorsiflexion    Ankle plantarflexion    Ankle inversion    Ankle eversion     (Blank rows = not tested)  LOWER EXTREMITY MMT:    MMT Right Eval Left Eval  Hip flexion 3 3  Hip extension 2 2  Hip abduction 3 4-  Hip adduction    Hip internal rotation    Hip external rotation    Knee flexion 4 4  Knee extension 4 4  Ankle dorsiflexion 3 3  Ankle plantarflexion    Ankle inversion    Ankle eversion    (Blank rows = not tested)  BED MOBILITY:  Sit to supine Complete Independence Supine to sit Complete Independence Rolling to Right Complete Independence Rolling to Left Complete Independence  TRANSFERS: Assistive device utilized: None  Sit to stand:  Complete Independence Stand to sit: Complete Independence Chair to chair: Complete Independence Floor:    GAIT: Gait pattern: WFL and trendelenburg Distance walked: 661f  Assistive device utilized: None Level of assistance: Complete Independence Comments: WNL   FUNCTIONAL TESTs:  Berg Balance Scale: 42/56 2MWT 6733f  TODAY'S TREATMENT:  03/02/22 Bike L4 x4m104m  UBE L4 x4 mins  Stretching HS, piriformis, glutes, SK2C 30s each  Manual traction of BLE  Standing on BOSU blue side catch 5 catches at best  Leg press 40# 2x10  Alternating arm and leg lifts x10A211/30/8/65cert Nustep [id number] on airex with red ball toss 2x10  Leg ext 15# 2x10 HS curls 35# 2x10 Lumbar traction 85# 45m61m  02/21/22 Walk outside Bike L4 x4 mins with power bursts x2  Hip abd/ext 5# 2x10 Standing on airex feet together catch red ball, tried SL but too difficult pt states it is bc she is tired Rows and Lats 20# 2x10  02/16/22 NuStep L5 x6min39mrogress note- rechecked goals, BERG  Elliptical L2 x4 mins Resisted gait with step up 6"  Leg ext 15# 2x10 HS curls 35# 2x10  02/13/22 NuStep L5 x 6 minutes. Supine bridge with pelvic tilt, SLR on R, rolling supine<> L sidelie, holding R arm and leg in the air to engage trunk. 10 reps Sit to stand with red Tband around knees, 2# weight, 10 reps B side step against Red Tband at knees, 2 x 10 each direction Heel raises on step with BUE support 2 x 10 reps  02/07/22 Ask about return to work, and goals Endurance training 5 mins on each machine NuStep L5, UBE L2, treadmill 2% 2mph 22mnding on airex feet together hitting ball S2S on airex 2x10 Mini squats on BOSO on blue side 2x10    02/01/22 Treadmill 1.5% 1.5mph x49mns B33m L4 x 5mins  R50msted gait 30# 4 way x4  Rows  and Lats 20# 2x10 Lateral step ups on BOSU SL on firm 7s at best  8" step up from airex  Leg press 40# 2x10  01/30/22 NuStep L5 x28mns  Elliptical L2 x4 mins  Leg  ext 10# 2x10, LLE 5# x5 Hs curls 25# 2x10, 15# x10 LLE  mini squats on BOSU 2x10  Ladder drills for coordination  Tandem walking  01/25/22 NuStep L5 x661ms  Resisted side steps over obstacles 5x each way  Tandem on airex catch Step up on BOSU  Leg press 40# 2x10 Calf raises on leg press 2x12 Shoulder ext 10# 2x10  01/23/22 Bike L4 x5m73m  Walking outdoors  Lateral step ups 6" Shoulder ext 10# 2x10 Cable rows 10# 2x10 Standing on airex hitting ball feet together, half tandem  Leg ext 5# 2x10 HS curls 25# 2x10 STS on airex 2x10   01/18/22 NuStep L6 x5mi14m Walking on beam narrow steps and side steps  Side steps on airex  Lateral band walks greenTB  Calf raises on black bar 2x10 Leg press 20# x10, 30# x10 Shoulder rows and ext greenTB 2x10  01/16/22 NuStep L5 x6min56mridges 2x10 SLR 2# 2x10 STS w/OHP x10, x10 with push out  Step ups 6"  Leg ext 5# 2x10 HS curls 25# 2x10  Resisted gait 20# 4 way x4   01/12/22 Nustep L4 x6 minutes  Bridges x5  Sidelying hip ABD x5 B   PATIENT EDUCATION: Education details: exam findings, POC, HEP  Person educated: Patient Education method: ExplaConsulting civil engineeronMedia planner Handouts Education comprehension: verbalized understanding and returned demonstration   HOME EXERCISE PROGRAM: [id number]    GOALS: Goals reviewed with patient? Yes  SHORT TERM GOALS: Target date: 01/30/2022  Will be compliant with appropriate progressive HEP  Baseline: Goal status: MET  2.  Will be able to name at least 3 ways to reduce fall risk at home and in community  Baseline:  Goal status: MET  3.  Will tolerate standing tasks for at least 90 minutes without increase in fatigue  Baseline:  Goal status: IN PROGRESS  4.  Will be able to walk for at least 15 minutes without rest to show improved functional activity tolerance  Baseline: 10 mins- 11/2 Goal status: IN PROGRESS    LONG TERM GOALS: Target date: 02/27/2022  MMT to improve by  at least 1 grade in all weak groups  Baseline:  Goal status: ongoing  2.  Will score at least 50 on the Berg to show improved functional balance  Baseline: 53- 11/2 Goal status: MET  3.  Will tolerate at least 1/2 day at work without fatigue to show improved functional activity tolerance  Goal status: MET  4.  Will be independent with appropriate gym based exercise program to maintain functional gains and continue progress on an independent basis  Baseline:  Goal status: IN PROGRESS   ASSESSMENT:  CLINICAL IMPRESSION: Patient arrives a few mins late. We worked on some LE stretching and manual traction. She is very tight in hamstrings and piriformis, that could be contributing to some of the numbness and tingling in her feet. Difficulty with coordination activity.    OBJECTIVE IMPAIRMENTS cardiopulmonary status limiting activity, decreased activity tolerance, decreased balance, decreased cognition, decreased mobility, difficulty walking, decreased strength, and improper body mechanics.   ACTIVITY LIMITATIONS standing, squatting, stairs, transfers, bed mobility, locomotion level, and caring for others  PARTICIPATION LIMITATIONS: community activity, occupation, and yard work  PERSONAL FACTORS Age, Fitness, Past/current experiences, and Time since onset  of injury/illness/exacerbation are also affecting patient's functional outcome.   REHAB POTENTIAL: Good  CLINICAL DECISION MAKING: Evolving/moderate complexity  EVALUATION COMPLEXITY: Moderate  PLAN: PT FREQUENCY: 2x/week  PT DURATION: 8 weeks  PLANNED INTERVENTIONS: Therapeutic exercises, Therapeutic activity, Neuromuscular re-education, Balance training, Gait training, Patient/Family education, Self Care, Joint mobilization, Stair training, Dry Needling, Cognitive remediation, Cryotherapy, Moist heat, Taping, Ultrasound, Ionotophoresis 27m/ml Dexamethasone, Manual therapy, and Re-evaluation  PLAN FOR NEXT SESSION: Assess  tolerance to HEP, PN-re-assessment  MAndris Baumann DPT  03/02/22 6:00 PM  03/02/2022, 6:00 PM

## 2022-03-03 ENCOUNTER — Ambulatory Visit: Payer: BC Managed Care – PPO | Admitting: Allergy

## 2022-03-03 DIAGNOSIS — R053 Chronic cough: Secondary | ICD-10-CM

## 2022-03-03 DIAGNOSIS — R12 Heartburn: Secondary | ICD-10-CM

## 2022-03-03 DIAGNOSIS — J4550 Severe persistent asthma, uncomplicated: Secondary | ICD-10-CM

## 2022-03-03 DIAGNOSIS — J302 Other seasonal allergic rhinitis: Secondary | ICD-10-CM

## 2022-03-03 DIAGNOSIS — J309 Allergic rhinitis, unspecified: Secondary | ICD-10-CM

## 2022-03-06 ENCOUNTER — Telehealth: Payer: Self-pay | Admitting: Oncology

## 2022-03-06 NOTE — Telephone Encounter (Signed)
Called patient regarding providers departure, patient has been rescheduled with new provider in April. Patient is notified.

## 2022-03-06 NOTE — Therapy (Signed)
OUTPATIENT PHYSICAL THERAPY NEURO TREATMENT   Patient Name: Megan Reid MRN: [id number] DOB:01/27/1966, 56 y.o., female Today's Date: 03/07/2022   PCP: Rupashree, Varadarajan  REFERRING PROVIDER: Engler, Morgan C, DO    PT End of Session - 03/07/22 1716     Visit Number 14    Date for PT Re-Evaluation 04/20/22    PT Start Time 1716    PT Stop Time 1800    PT Time Calculation (min) 44 min    Activity Tolerance Patient tolerated treatment well;Patient limited by fatigue    Behavior During Therapy WFL for tasks assessed/performed               Past Medical History:  Diagnosis Date   Arthralgia 02/14/2016   Asthma    Autoimmune disease (HCC) 02/14/2016   Positive RF 139 CCP Negative  Positive ANA 1:640 Positive Ro Positive [place] Elevated ESR 105 - Sjorgen's Disease   DDD (degenerative disc disease), lumbar 02/14/2016   Fatigue 02/14/2016   Gastritis 02/14/2016   Hot flashes    Migraines 02/14/2016   Osteoarthritis of both hands 02/14/2016   Osteoarthritis of both knees 02/14/2016   Vitamin D deficiency 02/14/2016   Past Surgical History:  Procedure Laterality Date   BREAST BIOPSY Left    ROTATOR CUFF REPAIR Right 06/16/2019   TUBAL LIGATION     Patient Active Problem List   Diagnosis Date Noted   Decreased mobility and endurance 01/25/2022   Difficulty sleeping 01/25/2022   Dairy product intolerance 01/25/2022   Mild cognitive impairment 01/25/2022   Paresthesia of left leg 01/25/2022   Anemia 12/20/2021   Abnormal LFTs 12/20/2021   Toxic metabolic encephalopathy 12/12/2021   Thrombocytopenia (HCC) 12/04/2021   Seasonal and perennial allergic rhinitis 12/02/2020   Chronic coughing 12/02/2020   Sjogren's syndrome (HCC) 12/02/2020   Leukopenia 12/02/2020   Adverse reaction to food, subsequent encounter 06/23/2020   Heartburn 06/23/2020   Bruising 06/23/2020   Abrasion of right ankle 02/13/2017   Rheumatoid factor positive 09/01/2016   Leucopenia  09/01/2016   Autoimmune disease (HCC) 02/14/2016   Fatigue 02/14/2016   DDD (degenerative disc disease), lumbar 02/14/2016   Osteoarthritis of both knees 02/14/2016   Osteoarthritis of both hands 02/14/2016   Asthma 02/14/2016   Migraines 02/14/2016   Gastritis 02/14/2016   Vitamin D deficiency 02/14/2016   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HCC) 02/14/2016   High risk medication use 02/14/2016    ONSET DATE: 12/21/2021   REFERRING DIAG: G92.8 (ICD-10-CM) - Toxic metabolic encephalopathy   THERAPY DIAG:  Other lack of coordination  Unsteadiness on feet  Muscle weakness (generalized)  Rationale for Evaluation and Treatment Rehabilitation  SUBJECTIVE:                                                                                                                                                                                                SUBJECTIVE STATEMENT: I am good, my left knee felt like it kept giving out today.   Pt accompanied by: self  PERTINENT HISTORY: Brief HPI:   Megan Reid is a 56 y.o. female with history of DDD lumbar spine, asthma, autoimmune disease, Sjogren's with recent onset of trigeminal neuralgia and was started on Tegretol.  She was also reported to have been taking ibuprofen as well as Tylenol for pain management.  She was found to have metabolic encephalopathy with acute hepatic and renal failure.  Renal failure treated with IV fluids and bicarb .  AKI felt to be due to ATN from acute illness, diarrhea and ibuprofen use.  She developed fever past admission and was treated with IV antibiotics due to concerns of sepsis.  Work-up was negative for DR ESS, tickborne illness, CMV, EBV and acute hepatitis/pancytopenia felt to be secondary to Tegretol toxicity.  She did not have pancytopenia and supportive care was recommended by heme-onc.  Renal status and abnormal LFTs were resolving however patient continued limited by weakness, DOE as well as  encephalopathy with delayed processing.  CIR was recommended due to functional decline.     Hospital Course: Megan Reid was admitted to rehab 12/12/2021 for inpatient therapies to consist of PT, ST and OT at least three hours five days a week. Past admission physiatrist, therapy team and rehab RN have worked together to provide customized collaborative inpatient rehab.  Her blood pressures were monitored on TID basis and has been stable.  Follow-up check of electrolytes revealed renal status as well as abnormal LFTs to be gradually improving.  She was found to have drop in magnesium to 1.3 requiring IV supplementation as well as p.o. supplement.  Repeat check showed improvement with magnesium of 2.0.  Recommend repeat check in 1 to 2 weeks to monitor for stability as well as need to continue oral supplementation.   Follow-up CBC showed improvement in white count with stable H&H and that thrombocytopenia had resolved.  Her alertness and mentation has gradually improved with improvement in activity tolerance and respiratory status.  Hydroxyzine 10 mg 3 times daily as needed was added to help manage anxiety.  Sleep-wake disruption is improved with addition of melatonin.  Constipation has resolved with augmentation of bowel regimen.  She has made steady gains during her rehab stay and is modified independent at discharge.  Supervision is recommended with cognitive task and for safety.  She will continue to receive follow-up outpatient PT, OT and ST at [facility] outpatient rehab after discharge.     Rehab course: During patient's stay in rehab weekly team conferences were held to monitor patient's progress, set goals and discuss barriers to discharge. At admission, patient required min assist with mobility and with ADL tasks.  She exhibited mild cognitive deficits affecting recall, memory and executive function with SLUMS score 22/30. She  has had improvement in activity tolerance, balance, postural control  as well as ability to compensate for deficits. She is able to complete ADL tasks at modified independent level. She is independent for transfers and is able to ambulate 150' without AD. She is able to climb 12 stairs independently. She requires supervision with functional and complex cognitive tasks. Her SLUMS score has improved to 26/30. Family education has been completed.    Disposition: Home  PAIN:  Are you having pain? No  PRECAUTIONS: Fall  WEIGHT BEARING RESTRICTIONS No  FALLS: Has patient fallen in last 6 months? Yes. Number of falls 1  LIVING ENVIRONMENT:  Lives with: lives with their spouse Lives in: House/apartment Stairs: 4 STE B rails, home is handicap accessible except for bathtub  Has following equipment at home: None  PLOF: Independent and Independent with basic ADLs  PATIENT GOALS build strength and balance, endurance   OBJECTIVE:   DIAGNOSTIC FINDINGS:   COGNITION: Overall cognitive status: Impaired- memory issues after acute medical problem    SENSATION: Not tested   LOWER EXTREMITY ROM:     Active  Right Eval Left Eval  Hip flexion    Hip extension    Hip abduction    Hip adduction    Hip internal rotation    Hip external rotation    Knee flexion    Knee extension    Ankle dorsiflexion    Ankle plantarflexion    Ankle inversion    Ankle eversion     (Blank rows = not tested)  LOWER EXTREMITY MMT:    MMT Right Eval Left Eval  Hip flexion 3 3  Hip extension 2 2  Hip abduction 3 4-  Hip adduction    Hip internal rotation    Hip external rotation    Knee flexion 4 4  Knee extension 4 4  Ankle dorsiflexion 3 3  Ankle plantarflexion    Ankle inversion    Ankle eversion    (Blank rows = not tested)  BED MOBILITY:  Sit to supine Complete Independence Supine to sit Complete Independence Rolling to Right Complete Independence Rolling to Left Complete Independence  TRANSFERS: Assistive device utilized: None  Sit to stand:  Complete Independence Stand to sit: Complete Independence Chair to chair: Complete Independence Floor:    GAIT: Gait pattern: WFL and trendelenburg Distance walked: 675ft  Assistive device utilized: None Level of assistance: Complete Independence Comments: WNL   FUNCTIONAL TESTs:  Berg Balance Scale: 42/56 2MWT 675ft   TODAY'S TREATMENT:  03/07/22 Elliptical L2 x 4mins  NuStep L5 x5mins  Stretching HS, glutes, IT band 30s each  Leg ext 15# 2x10 HS curls 35# 2x10 Manual traction to BLE, pt states it relieves her N/T   03/02/22 Bike L4 x4mins  UBE L4 x4 mins  Stretching HS, piriformis, glutes, SK2C 30s each  Manual traction of BLE  Standing on BOSU blue side catch 5 catches at best  Leg press 40# 2x10  Alternating arm and leg lifts x10  02/23/22 Recert Nustep L5x6mins  STS on airex with red ball toss 2x10  Leg ext 15# 2x10 HS curls 35# 2x10 Lumbar traction 85# 10mins   02/21/22 Walk outside Bike L4 x4 mins with power bursts x2  Hip abd/ext 5# 2x10 Standing on airex feet together catch red ball, tried SL but too difficult pt states it is bc she is tired Rows and Lats 20# 2x10   PATIENT EDUCATION: Education details: exam findings, POC, HEP  Person educated: Patient Education method: Explanation, Demonstration, and Handouts Education comprehension: verbalized understanding and returned demonstration   HOME EXERCISE PROGRAM: [id number]    GOALS: Goals reviewed with patient? Yes  SHORT TERM GOALS: Target date: 01/30/2022  Will be compliant with appropriate progressive HEP  Baseline: Goal status: MET  2.  Will be able to name at least 3 ways to reduce fall risk at home and in community  Baseline:  Goal status: MET  3.  Will tolerate standing tasks for at least 90 minutes without increase in fatigue  Baseline:  Goal status: IN PROGRESS  4.  Will be able to walk for at least 15   minutes without rest to show improved functional activity tolerance   Baseline: 10 mins- 11/2 Goal status: IN PROGRESS    LONG TERM GOALS: Target date: 04/20/21  MMT to improve by at least 1 grade in all weak groups  Baseline:  Goal status: ongoing  2.  Will score at least 50 on the Berg to show improved functional balance  Baseline: 53- 11/2 Goal status: MET  3.  Will tolerate at least 1/2 day at work without fatigue to show improved functional activity tolerance  Goal status: MET  4.  Will be independent with appropriate gym based exercise program to maintain functional gains and continue progress on an independent basis  Baseline:  Goal status: IN PROGRESS   ASSESSMENT:  CLINICAL IMPRESSION: We worked on some LE stretching and manual traction. She is very tight in hamstrings and piriformis, that could be contributing to some of the numbness and tingling in her feet. She states IT band stretch helped relieved her numbness.    OBJECTIVE IMPAIRMENTS cardiopulmonary status limiting activity, decreased activity tolerance, decreased balance, decreased cognition, decreased mobility, difficulty walking, decreased strength, and improper body mechanics.   ACTIVITY LIMITATIONS standing, squatting, stairs, transfers, bed mobility, locomotion level, and caring for others  PARTICIPATION LIMITATIONS: community activity, occupation, and yard work  PERSONAL FACTORS Age, Fitness, Past/current experiences, and Time since onset of injury/illness/exacerbation are also affecting patient's functional outcome.   REHAB POTENTIAL: Good  CLINICAL DECISION MAKING: Evolving/moderate complexity  EVALUATION COMPLEXITY: Moderate  PLAN: PT FREQUENCY: 2x/week  PT DURATION: 8 weeks  PLANNED INTERVENTIONS: Therapeutic exercises, Therapeutic activity, Neuromuscular re-education, Balance training, Gait training, Patient/Family education, Self Care, Joint mobilization, Stair training, Dry Needling, Cognitive remediation, Cryotherapy, Moist heat, Taping, Traction,  Ultrasound, Ionotophoresis 4mg/ml Dexamethasone, Manual therapy, and Re-evaluation  PLAN FOR NEXT SESSION: Assess tolerance to HEP, PN-re-assessment   , DPT  03/07/22 5:55 PM  03/07/2022, 5:55 PM 

## 2022-03-07 ENCOUNTER — Ambulatory Visit: Payer: BC Managed Care – PPO

## 2022-03-07 DIAGNOSIS — R278 Other lack of coordination: Secondary | ICD-10-CM | POA: Diagnosis not present

## 2022-03-07 DIAGNOSIS — R2681 Unsteadiness on feet: Secondary | ICD-10-CM

## 2022-03-07 DIAGNOSIS — M6281 Muscle weakness (generalized): Secondary | ICD-10-CM

## 2022-03-07 NOTE — Progress Notes (Signed)
Follow Up Note  RE: Megan Reid MRN: [id number] DOB: 22-Jan-1966 Date of Office Visit: 03/08/2022  Referring provider: Lorenda Reid,* Primary care provider: [name], MD  Chief Complaint: Asthma (6 mth f/u - All over the place), Seasonal and Perennial Allergic rhinitis (6 mth f/u - Fine), Food Allergy (6 mth f/u - Patient states she has avoided all food allergens), and Cough (X 1 mth on/off - dry due to weather change)  History of Present Illness: I had the pleasure of seeing Megan Reid for a follow up visit at the [facility] on 03/08/2022. She is a 56 y.o. female, who is being followed for allergic rhinitis, GERD, asthma on Xolair, chronic cough. Her previous allergy office visit was on 06/27/2021 with Dr. Selena Reid. Today is a regular follow up visit.  Seasonal and perennial allergic rhinitis Currently not taking any antihistamines due to Sjogren's disease and stopped taking Singulair after she got hospitalized in August.  Not taking any nasal sprays but has some nasal congestion. Flonase caused change in taste.  No recent ENT evaluation.    Heartburn Only taking Protonix as needed now. She used to take it twice a day at some point.  Patient saw GI and had normal EGD.    Chronic coughing Patient is having coughing fits in the morning and at night.  She is also having some drainage with this.   She was hospitalized in August. She was taking tegretol but it caused issues with her liver and kidneys - acute hepatitis and was in the ICU.   Now back to baseline with her liver and kidneys but coughing unchanged.   Asthma Patient has not seen the pulmonologist. Patient restarted Xolair 375mg  every 2 weeks at home last week. She has been on it for 1+ year. She has another dose in the fridge and would like to finish that before switching. Willing to try Tezspire.   Currently on Symbicort 2 puffs BID.  Stopped Spiriva as she ran out.    Using albuterol 2-3 times per week with good benefit.   She is having some shortness of breath with exertion which improved after a few minutes rest. She was in the ICU for 14 days.   Denies any ER/urgent care visits or prednisone use since the last visit.  Patient is going to speech therapy (after her hospitalization it was recommended) and doing some coughing exercises.   Assessment and Plan: Megan Reid is a 57 y.o. female with: Asthma Past history - Managed by pulmonology and on Xolair 375mg  every 2 weeks. Interim history - patient stopped Spiriva and Xolair with no change in symptoms. She just recently restarted Xolair. No recent pulmonology visit. Patient wants [place] to take over asthma management.  Today's spirometry was normal.  Daily controller medication(s): Symbicort 2 puffs twice a day with spacer and rinse mouth afterwards. Continue Xolair injections every 2 weeks - take the medications you still have at home.  Will start PA process for Tezspire every 4 weeks at home - handout given.  Stop Spiriva.  May use albuterol rescue inhaler 2 puffs every 4 to 6 hours as needed for shortness of breath, chest tightness, coughing, and wheezing. May use albuterol rescue inhaler 2 puffs 5 to 15 minutes prior to strenuous physical activities. Monitor frequency of use.  Get CT chest as scheduled - will need pulmonology follow up if abnormalities noted. Dyspnea on exertion most likely due to physical deconditioning after 14 day ICU  stay.  Chronic coughing Past history - Medical history significant for Sjogren's, asthma, reflux, and allergic rhinitis. Covid-19 in November 2022 Interim history - unchanged and maybe worse since she was hospitalized in August. Had normal EGD.  Discussed with patient that she had many changes and medial issues since August that it is unclear what's the etiology of her coughing. Will start with treating GERD and PND.   Gastroesophageal reflux disease Off  medications now and only taking prn.  Restart Protonix 20mg  once a day x 2 weeks and if no improvement then increase to 40mg  once a day and nothing to eat/drink for 30 minutes afterwards. Continue lifestyle and dietary modifications as below.  Seasonal and perennial allergic rhinitis Past history - Perennial rhinitis symptoms for 2 years with worsening in the winter.  Tried Flonase, Singulair and Zyrtec with some benefit.  Testing over 10 years ago was positive to grass per patient report.  No prior allergy immunotherapy. Elevated IgE level - repeat higher but drawn while on Xolair. 2022 skin testing showed: Negative to indoor/outdoor allergens. 2022 bloodwork positive to dust mites. Interim history - did not like Flonase.  Continue environmental control measures.  Use Nasacort (triamcinolone) nasal spray 1 spray per nostril twice a day as needed for nasal congestion. Samples given. Demonstrated proper use.  No antihistamines due to patient's Sjogren's disease.   Return in about 2 months (around 05/08/2022).  Continue to follow up with all your other specialist.   Meds ordered this encounter  Medications   triamcinolone (NASACORT) 55 MCG/ACT AERO nasal inhaler    Sig: Place 1 spray into the nose 2 (two) times daily as needed (nasal symptoms).    Dispense:  1 each    Refill:  5   Lab Orders  No laboratory test(s) ordered today    Diagnostics: Spirometry:  Tracings reviewed. Her effort: Good reproducible efforts. FVC: 3.60L FEV1: 2.45L, 94% predicted FEV1/FVC ratio: 68% Interpretation: Spirometry consistent with normal pattern.  Please see scanned spirometry results for details.  Medication List:  Current Outpatient Medications  Medication Sig Dispense Refill   albuterol (PROVENTIL HFA;VENTOLIN HFA) 108 (90 Base) MCG/ACT inhaler Inhale 2 puffs into the lungs 2 (two) times daily. For shortness of breath 18 g 0   gabapentin (NEURONTIN) 100 MG capsule Take 1 capsule (100 mg total) by  mouth at bedtime. 30 capsule 5   hydrOXYzine (ATARAX) 10 MG tablet Take 1 tablet (10 mg total) by mouth 3 (three) times daily as needed for anxiety. 30 tablet 0   melatonin 5 MG TABS Take 1 tablet (5 mg total) by mouth at bedtime as needed. 30 tablet 0   montelukast (SINGULAIR) 10 MG tablet Take 1 tablet (10 mg total) by mouth at bedtime. 30 tablet 5   Multiple Vitamin (MULTIVITAMIN WITH MINERALS) TABS tablet Take 1 tablet by mouth daily.     pantoprazole (PROTONIX) 40 MG tablet Take 1 tablet (40 mg total) by mouth daily. 30 tablet 5   SYMBICORT 160-4.5 MCG/ACT inhaler INHALE 2 PUFFS INTO THE LUNGS TWICE DAILY (Patient taking differently: Inhale 2 puffs into the lungs in the morning and at bedtime.) 10.2 g 5   triamcinolone (NASACORT) 55 MCG/ACT AERO nasal inhaler Place 1 spray into the nose 2 (two) times daily as needed (nasal symptoms). 1 each 5   No current facility-administered medications for this visit.   Allergies: Allergies  Allergen Reactions   Sulfa Antibiotics Anaphylaxis, Hives and Swelling    Swelling of the face and tongue  Tegretol [Carbamazepine] Other (See Comments)    Suspected hepatotoxicity and pancytopenia, required inpatient admission 11/2021   I reviewed her past medical history, social history, family history, and environmental history and no significant changes have been reported from her previous visit.  Review of Systems  Constitutional:  Positive for fatigue. Negative for appetite change, fever and unexpected weight change.  HENT:  Positive for congestion. Negative for rhinorrhea.   Eyes:  Negative for itching.  Respiratory:  Positive for cough and shortness of breath. Negative for chest tightness and wheezing.   Skin:  Negative for rash.  Allergic/Immunologic: Positive for environmental allergies.  Neurological:  Negative for headaches.    Objective: BP 114/78   Pulse 86   Temp 97.6 F (36.4 C)   Resp 14   Ht 5\' 9"  (1.753 m)   Wt 166 lb 8 oz (75.5  kg)   LMP 05/22/2012   SpO2 100%   BMI 24.59 kg/m  Body mass index is 24.59 kg/m. Physical Exam Vitals and nursing note reviewed.  Constitutional:      Appearance: Normal appearance. She is well-developed.  HENT:     Head: Normocephalic and atraumatic.     Right Ear: Tympanic membrane and external ear normal.     Left Ear: Tympanic membrane and external ear normal.     Nose: Congestion present.     Mouth/Throat:     Pharynx: Oropharynx is clear.  Eyes:     Conjunctiva/sclera: Conjunctivae normal.  Cardiovascular:     Rate and Rhythm: Normal rate and regular rhythm.     Heart sounds: Normal heart sounds. No murmur heard. Pulmonary:     Effort: Pulmonary effort is normal.     Breath sounds: Normal breath sounds. No wheezing, rhonchi or rales.  Musculoskeletal:     Cervical back: Neck supple.  Skin:    General: Skin is warm.     Findings: No rash.  Neurological:     Mental Status: She is alert and oriented to person, place, and time.  Psychiatric:        Behavior: Behavior normal.    Previous notes and tests were reviewed. The plan was reviewed with the patient/family, and all questions/concerned were addressed.  It was my pleasure to see Megan Reid today and participate in her care. Please feel free to contact me with any questions or concerns.  Sincerely,  Wyline Mood, DO Allergy & Immunology  [facility] office: [phone] [facility] office: [phone]

## 2022-03-08 ENCOUNTER — Encounter: Payer: Self-pay | Admitting: Allergy

## 2022-03-08 ENCOUNTER — Ambulatory Visit (INDEPENDENT_AMBULATORY_CARE_PROVIDER_SITE_OTHER): Payer: BC Managed Care – PPO | Admitting: Allergy

## 2022-03-08 ENCOUNTER — Other Ambulatory Visit: Payer: Self-pay

## 2022-03-08 VITALS — BP 114/78 | HR 86 | Temp 97.6°F | Resp 14 | Ht 69.0 in | Wt 166.5 lb

## 2022-03-08 DIAGNOSIS — R053 Chronic cough: Secondary | ICD-10-CM | POA: Diagnosis not present

## 2022-03-08 DIAGNOSIS — J4550 Severe persistent asthma, uncomplicated: Secondary | ICD-10-CM | POA: Diagnosis not present

## 2022-03-08 DIAGNOSIS — K219 Gastro-esophageal reflux disease without esophagitis: Secondary | ICD-10-CM

## 2022-03-08 DIAGNOSIS — J3089 Other allergic rhinitis: Secondary | ICD-10-CM

## 2022-03-08 DIAGNOSIS — J302 Other seasonal allergic rhinitis: Secondary | ICD-10-CM

## 2022-03-08 MED ORDER — TRIAMCINOLONE ACETONIDE 55 MCG/ACT NA AERO
1.0000 | INHALATION_SPRAY | Freq: Two times a day (BID) | NASAL | 5 refills | Status: AC | PRN
Start: 2022-03-08 — End: ?

## 2022-03-08 NOTE — Assessment & Plan Note (Signed)
Off medications now and only taking prn.  Restart Protonix 20mg  once a day x 2 weeks and if no improvement then increase to 40mg  once a day and nothing to eat/drink for 30 minutes afterwards. Continue lifestyle and dietary modifications as below.

## 2022-03-08 NOTE — Assessment & Plan Note (Signed)
Past history - Medical history significant for Sjogren's, asthma, reflux, and allergic rhinitis. Covid-19 in November 2022 Interim history - unchanged and maybe worse since she was hospitalized in August. Had normal EGD.  Discussed with patient that she had many changes and medial issues since August that it is unclear what's the etiology of her coughing. Will start with treating GERD and PND.

## 2022-03-08 NOTE — Assessment & Plan Note (Addendum)
Past history - Managed by pulmonology and on Xolair 375mg  every 2 weeks. Interim history - patient stopped Spiriva and Xolair with no change in symptoms. She just recently restarted Xolair. No recent pulmonology visit. Patient wants to take over asthma management.  Today's spirometry was normal.  Daily controller medication(s): Symbicort [place] 2 puffs twice a day with spacer and rinse mouth afterwards. Continue Xolair injections every 2 weeks - take the medications you still have at home.  Will start PA process for Tezspire every 4 weeks at home - handout given.  Stop Spiriva.  May use albuterol rescue inhaler 2 puffs every 4 to 6 hours as needed for shortness of breath, chest tightness, coughing, and wheezing. May use albuterol rescue inhaler 2 puffs 5 to 15 minutes prior to strenuous physical activities. Monitor frequency of use.  Get CT chest as scheduled - will need pulmonology follow up if abnormalities noted. Dyspnea on exertion most likely due to physical deconditioning after 14 day ICU stay.

## 2022-03-08 NOTE — Patient Instructions (Addendum)
Coughing  Not sure what's causing your coughing - most likely multifactorial.  The shortness of breath is most likely due to physical deconditioning.  Asthma: Normal breathing test.  Daily controller medication(s): Symbicort 2 puffs twice a day with spacer and rinse mouth afterwards. Continue Xolair injections every 2 weeks - take the medications you still have at home.  Think about switching to Tezspire every 4 weeks. Handout given.  Will start PA process.  May use albuterol rescue inhaler 2 puffs every 4 to 6 hours as needed for shortness of breath, chest tightness, coughing, and wheezing. May use albuterol rescue inhaler 2 puffs 5 to 15 minutes prior to strenuous physical activities. Monitor frequency of use.  Asthma control goals:  Full participation in all desired activities (may need albuterol before activity) Albuterol use two times or less a week on average (not counting use with activity) Cough interfering with sleep two times or less a month Oral steroids no more than once a year No hospitalizations  Reflux: Restart Protonix 20mg  once a day x 2 weeks and if no improvement then increase to 40mg  once a day and nothing to eat/drink for 30 minutes afterwards. Continue lifestyle and dietary modifications as below.  Rhinitis: Continue environmental control measures.  Use Nasacort (triamcinolone) nasal spray 1 spray per nostril twice a day as needed for nasal congestion. Samples given.  No antihistamines due to patient's Sjogren's disease.   Follow up in 2 months or sooner if needed.   Follow up with all your specialist.   Control of House Dust Mite Allergen Dust mite allergens are a common trigger of allergy and asthma symptoms. While they can be found throughout the house, these microscopic creatures thrive in warm, humid environments such as bedding, upholstered furniture and carpeting. Because so much time is spent in the bedroom, it is essential to reduce mite levels  there.  Encase pillows, mattresses, and box springs in special allergen-proof fabric covers or airtight, zippered plastic covers.  Bedding should be washed weekly in hot water (130 F) and dried in a hot dryer. Allergen-proof covers are available for comforters and pillows that can't be regularly washed.  Wash the allergy-proof covers every few months. Minimize clutter in the bedroom. Keep pets out of the bedroom.  Keep humidity less than 50% by using a dehumidifier or air conditioning. You can buy a humidity measuring device called a hygrometer to monitor this.  If possible, replace carpets with hardwood, linoleum, or washable area rugs. If that's not possible, vacuum frequently with a vacuum that has a HEPA filter. Remove all upholstered furniture and non-washable window drapes from the bedroom. Remove all non-washable stuffed toys from the bedroom.  Wash stuffed toys weekly.  Pet Allergen Avoidance: Contrary to popular opinion, there are no "hypoallergenic" breeds of dogs or cats. That is because people are not allergic to an animal's hair, but to an allergen found in the animal's saliva, dander (dead skin flakes) or urine. Pet allergy symptoms typically occur within minutes. For some people, symptoms can build up and become most severe 8 to 12 hours after contact with the animal. People with severe allergies can experience reactions in public places if dander has been transported on the pet owners' clothing. Keeping an animal outdoors is only a partial solution, since homes with pets in the yard still have higher concentrations of animal allergens. Before getting a pet, ask your allergist to determine if you are allergic to animals. If your pet is already considered part  of your family, try to minimize contact and keep the pet out of the bedroom and other rooms where you spend a great deal of time. As with dust mites, vacuum carpets often or replace carpet with a hardwood floor, tile or  linoleum. High-efficiency particulate air (HEPA) cleaners can reduce allergen levels over time. While dander and saliva are the source of cat and dog allergens, urine is the source of allergens from rabbits, hamsters, mice and [place] pigs; so ask a non-allergic family member to clean the animal's cage. If you have a pet allergy, talk to your allergist about the potential for allergy immunotherapy (allergy shots). This strategy can often provide long-term relief.

## 2022-03-08 NOTE — Assessment & Plan Note (Deleted)
Past history - Managed by pulmonology and on Xolair 375mg  every 2 weeks. Interim history - patient stopped Spiriva and Xolair with no change in symptoms. She just recently restarted Xolair. No recent pulmonology visit. Patient wants to take over asthma management.  Today's spirometry was normal.  Daily controller medication(s): Symbicort [place] 2 puffs twice a day with spacer and rinse mouth afterwards. Continue Xolair injections every 2 weeks - take the medications you still have at home.  Will start PA process for Tezspire every 4 weeks at home - handout given.  Stop Spiriva.  May use albuterol rescue inhaler 2 puffs every 4 to 6 hours as needed for shortness of breath, chest tightness, coughing, and wheezing. May use albuterol rescue inhaler 2 puffs 5 to 15 minutes prior to strenuous physical activities. Monitor frequency of use.  Get CT chest as scheduled - will need pulmonology follow up if abnormalities noted.

## 2022-03-08 NOTE — Assessment & Plan Note (Signed)
Past history - Perennial rhinitis symptoms for 2 years with worsening in the winter.  Tried Flonase, Singulair and Zyrtec with some benefit.  Testing over 10 years ago was positive to grass per patient report.  No prior allergy immunotherapy. Elevated IgE level - repeat higher but drawn while on Xolair. 2022 skin testing showed: Negative to indoor/outdoor allergens. 2022 bloodwork positive to dust mites. Interim history - did not like Flonase.  Continue environmental control measures.  Use Nasacort (triamcinolone) nasal spray 1 spray per nostril twice a day as needed for nasal congestion. Samples given. Demonstrated proper use.  No antihistamines due to patient's Sjogren's disease.

## 2022-03-13 ENCOUNTER — Telehealth: Payer: Self-pay | Admitting: *Deleted

## 2022-03-13 NOTE — Telephone Encounter (Signed)
-----   Message from Ellamae Sia, DO sent at 03/08/2022  1:13 PM EST ----- Please start PA for Tezspire for at home injections. Currently on Xolair 375mg  every 2 weeks.

## 2022-03-13 NOTE — Therapy (Signed)
OUTPATIENT PHYSICAL THERAPY NEURO TREATMENT   Patient Name: Megan Reid MRN: [id number] DOB:09/12/1965, 56 y.o., female Today's Date: 03/14/2022   PCP: Leeroy Cha  REFERRING PROVIDER: Gertie Gowda, DO    PT End of Session - 03/14/22 1727     Visit Number 15    Date for PT Re-Evaluation 04/20/22    PT Start Time 1726    PT Stop Time 1800    PT Time Calculation (min) 34 min    Activity Tolerance Patient tolerated treatment well;Patient limited by fatigue    Behavior During Therapy [facility] for tasks assessed/performed                Past Medical History:  Diagnosis Date   Arthralgia 02/14/2016   Asthma    Autoimmune disease ([place]) 02/14/2016   Positive RF 139 CCP Negative  Positive ANA 1:640 Positive Ro Positive [place] Elevated ESR 105 - Sjorgen's Disease   DDD (degenerative disc disease), lumbar 02/14/2016   Fatigue 02/14/2016   Gastritis 02/14/2016   Hot flashes    Migraines 02/14/2016   Osteoarthritis of both hands 02/14/2016   Osteoarthritis of both knees 02/14/2016   Vitamin D deficiency 02/14/2016   Past Surgical History:  Procedure Laterality Date   BREAST BIOPSY Left    ROTATOR CUFF REPAIR Right 06/16/2019   TUBAL LIGATION     Patient Active Problem List   Diagnosis Date Noted   Gastroesophageal reflux disease 03/08/2022   Decreased mobility and endurance 01/25/2022   Difficulty sleeping 01/25/2022   Dairy product intolerance 01/25/2022   Mild cognitive impairment 01/25/2022   Paresthesia of left leg 01/25/2022   Anemia 12/20/2021   Abnormal LFTs 92/49/3241   Toxic metabolic encephalopathy 99/14/4458   Thrombocytopenia ([place]) 12/04/2021   Seasonal and perennial allergic rhinitis 12/02/2020   Chronic coughing 12/02/2020   Sjogren's syndrome ([place]) 12/02/2020   Leukopenia 12/02/2020   Adverse reaction to food, subsequent encounter 06/23/2020   Heartburn 06/23/2020   Bruising 06/23/2020   Abrasion of right ankle 02/13/2017   Rheumatoid  factor positive 09/01/2016   Leucopenia 09/01/2016   Autoimmune disease ([place]) 02/14/2016   Fatigue 02/14/2016   DDD (degenerative disc disease), lumbar 02/14/2016   Osteoarthritis of both knees 02/14/2016   Osteoarthritis of both hands 02/14/2016   Asthma 02/14/2016   Migraines 02/14/2016   Gastritis 02/14/2016   Vitamin D deficiency 02/14/2016   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 I am good.  Pt accompanied by: self  PERTINENT HISTORY: Brief HPI:   Megan Reid is a 56 y.o. female with history of DDD lumbar spine, asthma, autoimmune disease, Sjogren's with recent onset of trigeminal neuralgia and was started on Tegretol.  She was also reported to have been taking ibuprofen as well as Tylenol for pain management.  She was found to have metabolic encephalopathy with acute hepatic and renal failure.  Renal failure treated with IV fluids and bicarb .  AKI felt to be due to ATN from acute illness, diarrhea and ibuprofen use.  She developed fever past admission and was treated with IV antibiotics due to concerns of sepsis.  Work-up was negative for DR ESS, tickborne illness, CMV, EBV and acute hepatitis/pancytopenia felt to be secondary to Tegretol toxicity.  She did not have pancytopenia and supportive care was recommended by heme-onc.  Renal status and abnormal LFTs were resolving  however patient continued limited by weakness, DOE as well as encephalopathy with delayed processing.  CIR was recommended due to functional decline.     Hospital Course: Megan Reid was admitted to rehab 12/12/2021 for inpatient therapies to consist of PT, ST and OT at least three hours five days a week. Past admission physiatrist, therapy team and rehab RN have worked together to provide customized collaborative inpatient rehab.  Her blood pressures were monitored on TID basis and has been stable.  Follow-up check of electrolytes revealed renal status as well as abnormal LFTs to be gradually improving.  She was found to have drop in magnesium to 1.3 requiring IV supplementation as well as p.o. supplement.  Repeat check showed improvement with magnesium of 2.0.  Recommend repeat check in 1 to 2 weeks to monitor for stability as well as need to continue oral supplementation.   Follow-up CBC showed improvement in white count with stable H&H and that thrombocytopenia had resolved.  Her alertness and mentation has gradually improved with improvement in activity tolerance and respiratory status.  Hydroxyzine 10 mg 3 times daily as needed was added to help manage anxiety.  Sleep-wake disruption is improved with addition of melatonin.  Constipation has resolved with augmentation of bowel regimen.  She has made steady gains during her rehab stay and is modified independent at discharge.  Supervision is recommended with cognitive task and for safety.  She will continue to receive follow-up outpatient PT, OT and ST at [facility] outpatient rehab after discharge.     Rehab course: During patient's stay in rehab weekly team conferences were held to monitor patient's progress, set goals and discuss barriers to discharge. At admission, patient required min assist with mobility and with ADL tasks.  She exhibited mild cognitive deficits affecting recall, memory and executive function with SLUMS score 22/30. She  has had  improvement in activity tolerance, balance, postural control as well as ability to compensate for deficits. She is able to complete ADL tasks at modified independent level. She is independent for transfers and is able to ambulate 150' without AD. She is able to climb 12 stairs independently. She requires supervision with functional and complex cognitive tasks. Her SLUMS score has improved to 26/30. Family education has been completed.    Disposition: Home  PAIN:  Are you having pain? No  PRECAUTIONS: Fall  WEIGHT BEARING RESTRICTIONS No  FALLS: Has patient fallen in last 6 months? Yes. Number of falls 1  LIVING  ENVIRONMENT: Lives with: lives with their spouse Lives in: House/apartment Stairs: 4 STE B rails, home is handicap accessible except for bathtub  Has following equipment at home: None  PLOF: Independent and Independent with basic ADLs  PATIENT GOALS build strength and balance, endurance   OBJECTIVE:   DIAGNOSTIC FINDINGS:   COGNITION: Overall cognitive status: Impaired- memory issues after acute medical problem    SENSATION: Not tested   LOWER EXTREMITY ROM:     Active  Right Eval Left Eval  Hip flexion    Hip extension    Hip abduction    Hip adduction    Hip internal rotation    Hip external rotation    Knee flexion    Knee extension    Ankle dorsiflexion    Ankle plantarflexion    Ankle inversion    Ankle eversion     (Blank rows = not tested)  LOWER EXTREMITY MMT:    MMT Right Eval Left Eval  Hip flexion 3 3  Hip extension 2 2  Hip abduction 3 4-  Hip adduction    Hip internal rotation    Hip external rotation    Knee flexion 4 4  Knee extension 4 4  Ankle dorsiflexion 3 3  Ankle plantarflexion    Ankle inversion    Ankle eversion    (Blank rows = not tested)  BED MOBILITY:  Sit to supine Complete Independence Supine to sit Complete Independence Rolling to Right Complete Independence Rolling to Left Complete  Independence  TRANSFERS: Assistive device utilized: None  Sit to stand: Complete Independence Stand to sit: Complete Independence Chair to chair: Complete Independence Floor:    GAIT: Gait pattern: WFL and trendelenburg Distance walked: 665f  Assistive device utilized: None Level of assistance: Complete Independence Comments: WNL   FUNCTIONAL TESTs:  Berg Balance Scale: 42/56 2MWT 6760f  TODAY'S TREATMENT:  03/14/22 NuStep L5x6m49m Resisted gait with step up 6" 30# x5 each side Resisted gait with side step up 30# x5 each side  Leg press 40# 2x10 SL stance 8s at best on L, 10s on R  SL catch-- unable to hold for more than 3-4s STS with OHP 2x10   03/07/22 Elliptical L2 x 4mi63m NuStep L5 x5min35mStretching HS, glutes, IT band 30s each  Leg ext 15# 2x10 HS curls 35# 2x10 Manual traction to BLE, pt states it relieves her N/T   03/02/22 Bike L4 x4mins2mBE L4 x4 mins  Stretching HS, piriformis, glutes, SK2C 30s each  Manual traction of BLE  Standing on BOSU blue side catch 5 catches at best  Leg press 40# 2x10  Alternating arm and leg lifts x10  1Q82/250/0/37t Nustep [id number] airex with red ball toss 2x10  Leg ext 15# 2x10 HS curls 35# 2x10 Lumbar traction 85# 10mins35m1/7/23 Walk outside Bike L4 x4 mins with power bursts x2  Hip abd/ext 5# 2x10 Standing on airex feet together catch red ball, tried SL but too difficult pt states it is bc she is tired Rows and Lats 20# 2x10   PATIENT EDUCATION: Education details: exam findings, POC, HEP  Person educated: Patient Education method: Explanation, Demonstration, and Handouts Education comprehension: verbalized understanding and returned demonstration   HOME EXERCISE PROGRAM: [id number]    GOALS: Goals reviewed with patient? Yes  SHORT TERM GOALS: Target date: 01/30/2022  Will be compliant with appropriate progressive HEP  Baseline: Goal status: MET  2.  Will be able to  name at least  3 ways to reduce fall risk at home and in community  Baseline:  Goal status: MET  3.  Will tolerate standing tasks for at least 90 minutes without increase in fatigue  Baseline:  Goal status: IN PROGRESS  4.  Will be able to walk for at least 15 minutes without rest to show improved functional activity tolerance  Baseline: 10 mins- 11/2 Goal status: IN PROGRESS    LONG TERM GOALS: Target date: 04/20/21  MMT to improve by at least 1 grade in all weak groups  Baseline:  Goal status: ongoing  2.  Will score at least 50 on the Berg to show improved functional balance  Baseline: 53- 11/2 Goal status: MET  3.  Will tolerate at least 1/2 day at work without fatigue to show improved functional activity tolerance  Goal status: MET  4.  Will be independent with appropriate gym based exercise program to maintain functional gains and continue progress on an independent basis  Baseline:  Goal status: IN PROGRESS   ASSESSMENT:  CLINICAL IMPRESSION: Patient enters late to appt due to meeting at school. We worked on some functional strengthening with the time we had.    OBJECTIVE IMPAIRMENTS cardiopulmonary status limiting activity, decreased activity tolerance, decreased balance, decreased cognition, decreased mobility, difficulty walking, decreased strength, and improper body mechanics.   ACTIVITY LIMITATIONS standing, squatting, stairs, transfers, bed mobility, locomotion level, and caring for others  PARTICIPATION LIMITATIONS: community activity, occupation, and yard work  PERSONAL FACTORS Age, Fitness, Past/current experiences, and Time since onset of injury/illness/exacerbation are also affecting patient's functional outcome.   REHAB POTENTIAL: Good  CLINICAL DECISION MAKING: Evolving/moderate complexity  EVALUATION COMPLEXITY: Moderate  PLAN: PT FREQUENCY: 2x/week  PT DURATION: 8 weeks  PLANNED INTERVENTIONS: Therapeutic exercises, Therapeutic activity, Neuromuscular  re-education, Balance training, Gait training, Patient/Family education, Self Care, Joint mobilization, Stair training, Dry Needling, Cognitive remediation, Cryotherapy, Moist heat, Taping, Traction, Ultrasound, Ionotophoresis 49m/ml Dexamethasone, Manual therapy, and Re-evaluation  PLAN FOR NEXT SESSION: Assess tolerance to HEP, PN-re-assessment  MAndris Baumann DPT  03/14/22 6:00 PM  03/14/2022, 6:00 PM

## 2022-03-13 NOTE — Telephone Encounter (Signed)
Called patient and advised change from [place] to [place] and approval, copay card and submit to [employer]. Reviewed instructions with patient for shipping, storage, dosing and initial injection in clinic

## 2022-03-14 ENCOUNTER — Ambulatory Visit: Payer: BC Managed Care – PPO

## 2022-03-14 DIAGNOSIS — R2681 Unsteadiness on feet: Secondary | ICD-10-CM

## 2022-03-14 DIAGNOSIS — R278 Other lack of coordination: Secondary | ICD-10-CM

## 2022-03-14 DIAGNOSIS — M6281 Muscle weakness (generalized): Secondary | ICD-10-CM

## 2022-03-15 NOTE — Therapy (Signed)
OUTPATIENT PHYSICAL THERAPY NEURO TREATMENT   Patient Name: Megan Reid MRN: [id number] DOB:[birth year], 56 y.o., female Today's Date: 03/16/2022   PCP: Megan Reid PROVIDER: Gertie Gowda, DO    PT End of Session - 03/16/22 1722     Visit Number 16    Date for PT Re-Evaluation 04/20/22    PT Start Time 1720    PT Stop Time 1800    PT Time Calculation (min) 40 min    Activity Tolerance Patient tolerated treatment well;Patient limited by fatigue    Behavior During Therapy Megan Reid for tasks assessed/performed                Past Medical History:  Diagnosis Date   Arthralgia 02/14/2016   Asthma    Autoimmune disease ([place]) 02/14/2016   Positive RF 139 CCP Negative  Positive ANA 1:640 Positive Ro Positive [place] Elevated ESR 105 - Sjorgen's Disease   DDD (degenerative disc disease), lumbar 02/14/2016   Fatigue 02/14/2016   Gastritis 02/14/2016   Hot flashes    Migraines 02/14/2016   Osteoarthritis of both hands 02/14/2016   Osteoarthritis of both knees 02/14/2016   Vitamin D deficiency 02/14/2016   Past Surgical History:  Procedure Laterality Date   BREAST BIOPSY Left    ROTATOR CUFF REPAIR Right 06/16/2019   TUBAL LIGATION     Patient Active Problem List   Diagnosis Date Noted   Gastroesophageal reflux disease 03/08/2022   Decreased mobility and endurance 01/25/2022   Difficulty sleeping 01/25/2022   Dairy product intolerance 01/25/2022   Mild cognitive impairment 01/25/2022   Paresthesia of left leg 01/25/2022   Anemia 12/20/2021   Abnormal LFTs 89/16/9450   Toxic metabolic encephalopathy 38/88/2800   Thrombocytopenia ([place]) 12/04/2021   Seasonal and perennial allergic rhinitis 12/02/2020   Chronic coughing 12/02/2020   Sjogren's syndrome ([place]) 12/02/2020   Leukopenia 12/02/2020   Adverse reaction to food, subsequent encounter 06/23/2020   Heartburn 06/23/2020   Bruising 06/23/2020   Abrasion of right ankle 02/13/2017   Rheumatoid  factor positive 09/01/2016   Leucopenia 09/01/2016   Autoimmune disease ([place]) 02/14/2016   Fatigue 02/14/2016   DDD (degenerative disc disease), lumbar 02/14/2016   Osteoarthritis of both knees 02/14/2016   Osteoarthritis of both hands 02/14/2016   Asthma 02/14/2016   Migraines 02/14/2016   Gastritis 02/14/2016   Vitamin D deficiency 02/14/2016   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[place]) 02/14/2016   High risk medication use 02/14/2016    ONSET DATE: 12/21/2021   REFERRING DIAG: G92.8 (LKJ-[id number]) - Toxic metabolic encephalopathy   THERAPY DIAG:  Other lack of coordination  Unsteadiness on feet  Muscle weakness (generalized)  Rationale for Evaluation and Treatment Rehabilitation  SUBJECTIVE:  SUBJECTIVE STATEMENT: I am alright, a little frustrated because of wor.   Pt accompanied by: self  PERTINENT HISTORY: Brief HPI:   [name] is a 56 y.o. female with history of DDD lumbar spine, asthma, autoimmune disease, Sjogren's with recent onset of trigeminal neuralgia and was started on Tegretol.  She was also reported to have been taking ibuprofen as well as Tylenol for pain management.  She was found to have metabolic encephalopathy with acute hepatic and renal failure.  Renal failure treated with IV fluids and bicarb .  AKI felt to be due to ATN from acute illness, diarrhea and ibuprofen use.  She developed fever past admission and was treated with IV antibiotics due to concerns of sepsis.  Work-up was negative for DR ESS, tickborne illness, CMV, EBV and acute hepatitis/pancytopenia felt to be secondary to Tegretol toxicity.  She did not have pancytopenia and supportive care was recommended by heme-onc.  Renal status and abnormal LFTs were resolving however patient continued  limited by weakness, DOE as well as encephalopathy with delayed processing.  CIR was recommended due to functional decline.     Hospital Course: Megan Reid was admitted to rehab 12/12/2021 for inpatient therapies to consist of PT, ST and OT at least three hours five days a week. Past admission physiatrist, therapy team and rehab RN have worked together to provide customized collaborative inpatient rehab.  Her blood pressures were monitored on TID basis and has been stable.  Follow-up check of electrolytes revealed renal status as well as abnormal LFTs to be gradually improving.  She was found to have drop in magnesium to 1.3 requiring IV supplementation as well as p.o. supplement.  Repeat check showed improvement with magnesium of 2.0.  Recommend repeat check in 1 to 2 weeks to monitor for stability as well as need to continue oral supplementation.   Follow-up CBC showed improvement in white count with stable H&H and that thrombocytopenia had resolved.  Her alertness and mentation has gradually improved with improvement in activity tolerance and respiratory status.  Hydroxyzine 10 mg 3 times daily as needed was added to help manage anxiety.  Sleep-wake disruption is improved with addition of melatonin.  Constipation has resolved with augmentation of bowel regimen.  She has made steady gains during her rehab stay and is modified independent at discharge.  Supervision is recommended with cognitive task and for safety.  She will continue to receive follow-up outpatient PT, OT and ST at [facility] outpatient rehab after discharge.     Rehab course: During patient's stay in rehab weekly team conferences were held to monitor patient's progress, set goals and discuss barriers to discharge. At admission, patient required min assist with mobility and with ADL tasks.  She exhibited mild cognitive deficits affecting recall, memory and executive function with SLUMS score 22/30. She  has had improvement in activity  tolerance, balance, postural control as well as ability to compensate for deficits. She is able to complete ADL tasks at modified independent level. She is independent for transfers and is able to ambulate 150' without AD. She is able to climb 12 stairs independently. She requires supervision with functional and complex cognitive tasks. Her SLUMS score has improved to 26/30. Family education has been completed.    Disposition: Home  PAIN:  Are you having pain? No  PRECAUTIONS: Fall  WEIGHT BEARING RESTRICTIONS No  FALLS: Has patient fallen in last 6 months? Yes. Number of falls 1  LIVING ENVIRONMENT: Lives with: lives with  their spouse Lives in: House/apartment Stairs: 4 STE B rails, home is handicap accessible except for bathtub  Has following equipment at home: None  PLOF: Independent and Independent with basic ADLs  PATIENT GOALS build strength and balance, endurance   OBJECTIVE:   DIAGNOSTIC FINDINGS:   COGNITION: Overall cognitive status: Impaired- memory issues after acute medical problem    SENSATION: Not tested   LOWER EXTREMITY ROM:     Active  Right Eval Left Eval  Hip flexion    Hip extension    Hip abduction    Hip adduction    Hip internal rotation    Hip external rotation    Knee flexion    Knee extension    Ankle dorsiflexion    Ankle plantarflexion    Ankle inversion    Ankle eversion     (Blank rows = not tested)  LOWER EXTREMITY MMT:    MMT Right Eval Left Eval  Hip flexion 3 3  Hip extension 2 2  Hip abduction 3 4-  Hip adduction    Hip internal rotation    Hip external rotation    Knee flexion 4 4  Knee extension 4 4  Ankle dorsiflexion 3 3  Ankle plantarflexion    Ankle inversion    Ankle eversion    (Blank rows = not tested)  BED MOBILITY:  Sit to supine Complete Independence Supine to sit Complete Independence Rolling to Right Complete Independence Rolling to Left Complete Independence  TRANSFERS: Assistive device  utilized: None  Sit to stand: Complete Independence Stand to sit: Complete Independence Chair to chair: Complete Independence Floor:    GAIT: Gait pattern: WFL and trendelenburg Distance walked: 685f  Assistive device utilized: None Level of assistance: Complete Independence Comments: WNL   FUNCTIONAL TESTs:  Berg Balance Scale: 42/56 2MWT 6742f  TODAY'S TREATMENT:  03/16/22 Bike with power bursts every 66m73m- L4x6mi566m 10" step ups x10 each side  4 way square x5 clockwise and then counterclockwise---then with direction changes  Side steps on BOSU Mini squats on BOSU 2x10 Side steps on beam with red ball catch Tandem walking on beam   03/14/22 NuStep L5x6min43mesisted gait with step up 6" 30# x5 each side Resisted gait with side step up 30# x5 each side  Leg press 40# 2x10 SL stance 8s at best on L, 10s on R  SL catch-- unable to hold for more than 3-4s STS with OHP 2x10   03/07/22 Elliptical L2 x 4mins78muStep L5 x5mins 46mretching HS, glutes, IT band 30s each  Leg ext 15# 2x10 HS curls 35# 2x10 Manual traction to BLE, pt states it relieves her N/T   03/02/22 Bike L4 x4mins  77m L4 x4 mins  Stretching HS, piriformis, glutes, SK2C 30s each  Manual traction of BLE  Standing on BOSU blue side catch 5 catches at best  Leg press 40# 2x10  Alternating arm and leg lifts x10  11/W103 27/2/53Nustep L5x6minsG6Y4IHKV airex with red ball toss 2x10  Leg ext 15# 2x10 HS curls 35# 2x10 Lumbar traction 85# 10mins  94m7/23 Walk outside Bike L4 x4 mins with power bursts x2  Hip abd/ext 5# 2x10 Standing on airex feet together catch red ball, tried SL but too difficult pt states it is bc she is tired Rows and Lats 20# 2x10   PATIENT EDUCATION: Education details: exam findings, POC, HEP  Person educated: Patient Education method: Explanation, Demonstration, and Handouts Education comprehension: verbalized  understanding and returned demonstration   HOME  EXERCISE PROGRAM: [id number]    GOALS: Goals reviewed with patient? Yes  SHORT TERM GOALS: Target date: 01/30/2022  Will be compliant with appropriate progressive HEP  Baseline: Goal status: MET  2.  Will be able to name at least 3 ways to reduce fall risk at home and in community  Baseline:  Goal status: MET  3.  Will tolerate standing tasks for at least 90 minutes without increase in fatigue  Baseline:  Goal status: IN PROGRESS  4.  Will be able to walk for at least 15 minutes without rest to show improved functional activity tolerance  Baseline: 10 mins- 11/2 Goal status: IN PROGRESS    LONG TERM GOALS: Target date: 04/20/21  MMT to improve by at least 1 grade in all weak groups  Baseline:  Goal status: ongoing  2.  Will score at least 50 on the Berg to show improved functional balance  Baseline: 53- 11/2 Goal status: MET  3.  Will tolerate at least 1/2 day at work without fatigue to show improved functional activity tolerance  Goal status: MET  4.  Will be independent with appropriate gym based exercise program to maintain functional gains and continue progress on an independent basis  Baseline:  Goal status: IN PROGRESS   ASSESSMENT:  CLINICAL IMPRESSION: Patient is doing well overall. Continued to work on balance and general overall endurance. Was able to walk tandem on beam for the first time today without support.   OBJECTIVE IMPAIRMENTS cardiopulmonary status limiting activity, decreased activity tolerance, decreased balance, decreased cognition, decreased mobility, difficulty walking, decreased strength, and improper body mechanics.   ACTIVITY LIMITATIONS standing, squatting, stairs, transfers, bed mobility, locomotion level, and caring for others  PARTICIPATION LIMITATIONS: community activity, occupation, and yard work  PERSONAL FACTORS Age, Fitness, Past/current experiences, and Time since onset of injury/illness/exacerbation are also affecting  patient's functional outcome.   REHAB POTENTIAL: Good  CLINICAL DECISION MAKING: Evolving/moderate complexity  EVALUATION COMPLEXITY: Moderate  PLAN: PT FREQUENCY: 2x/week  PT DURATION: 8 weeks  PLANNED INTERVENTIONS: Therapeutic exercises, Therapeutic activity, Neuromuscular re-education, Balance training, Gait training, Patient/Family education, Self Care, Joint mobilization, Stair training, Dry Needling, Cognitive remediation, Cryotherapy, Moist heat, Taping, Traction, Ultrasound, Ionotophoresis 78m/ml Dexamethasone, Manual therapy, and Re-evaluation  PLAN FOR NEXT SESSION: Assess tolerance to HEP, PN-re-assessment  MAndris Baumann DPT  03/16/22 6:36 PM  03/16/2022, 6:36 PM

## 2022-03-16 ENCOUNTER — Ambulatory Visit: Payer: BC Managed Care – PPO

## 2022-03-16 DIAGNOSIS — R278 Other lack of coordination: Secondary | ICD-10-CM

## 2022-03-16 DIAGNOSIS — M6281 Muscle weakness (generalized): Secondary | ICD-10-CM

## 2022-03-16 DIAGNOSIS — R2681 Unsteadiness on feet: Secondary | ICD-10-CM

## 2022-03-20 ENCOUNTER — Ambulatory Visit (HOSPITAL_COMMUNITY)
Admission: RE | Admit: 2022-03-20 | Discharge: 2022-03-20 | Disposition: A | Discharge status: Home / Self Care | Payer: BC Managed Care – PPO | Source: Ambulatory Visit | Attending: Internal Medicine | Admitting: Internal Medicine

## 2022-03-20 DIAGNOSIS — R911 Solitary pulmonary nodule: Secondary | ICD-10-CM | POA: Diagnosis present

## 2022-03-22 NOTE — Therapy (Signed)
OUTPATIENT PHYSICAL THERAPY NEURO TREATMENT   Patient Name: Megan Reid MRN: [id number] DOB:Jun 29, 1965, 56 y.o., female Today's Date: 03/23/2022   PCP: Leeroy Cha  REFERRING PROVIDER: Gertie Gowda, DO    PT End of Session - 03/23/22 1718     Visit Number 17    Date for PT Re-Evaluation 04/20/22    PT Start Time 1717    PT Stop Time 1800    PT Time Calculation (min) 43 min    Activity Tolerance Patient tolerated treatment well;Patient limited by fatigue    Behavior During Therapy [facility] for tasks assessed/performed                 Past Medical History:  Diagnosis Date   Arthralgia 02/14/2016   Asthma    Autoimmune disease ([place]) 02/14/2016   Positive RF 139 CCP Negative  Positive ANA 1:640 Positive Ro Positive [place] Elevated ESR 105 - Sjorgen's Disease   DDD (degenerative disc disease), lumbar 02/14/2016   Fatigue 02/14/2016   Gastritis 02/14/2016   Hot flashes    Migraines 02/14/2016   Osteoarthritis of both hands 02/14/2016   Osteoarthritis of both knees 02/14/2016   Vitamin D deficiency 02/14/2016   Past Surgical History:  Procedure Laterality Date   BREAST BIOPSY Left    ROTATOR CUFF REPAIR Right 06/16/2019   TUBAL LIGATION     Patient Active Problem List   Diagnosis Date Noted   Gastroesophageal reflux disease 03/08/2022   Decreased mobility and endurance 01/25/2022   Difficulty sleeping 01/25/2022   Dairy product intolerance 01/25/2022   Mild cognitive impairment 01/25/2022   Paresthesia of left leg 01/25/2022   Anemia 12/20/2021   Abnormal LFTs 76/16/0737   Toxic metabolic encephalopathy 10/62/6948   Thrombocytopenia ([place]) 12/04/2021   Seasonal and perennial allergic rhinitis 12/02/2020   Chronic coughing 12/02/2020   Sjogren's syndrome ([place]) 12/02/2020   Leukopenia 12/02/2020   Adverse reaction to food, subsequent encounter 06/23/2020   Heartburn 06/23/2020   Bruising 06/23/2020   Abrasion of right ankle 02/13/2017    Rheumatoid factor positive 09/01/2016   Leucopenia 09/01/2016   Autoimmune disease ([place]) 02/14/2016   Fatigue 02/14/2016   DDD (degenerative disc disease), lumbar 02/14/2016   Osteoarthritis of both knees 02/14/2016   Osteoarthritis of both hands 02/14/2016   Asthma 02/14/2016   Migraines 02/14/2016   Gastritis 02/14/2016   Vitamin D deficiency 02/14/2016   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[place]) 02/14/2016   High risk medication use 02/14/2016    ONSET DATE: 12/21/2021   REFERRING DIAG: G92.8 (NIO-27-OJ) - Toxic metabolic encephalopathy   THERAPY DIAG:  Other lack of coordination  Unsteadiness on feet  Muscle weakness (generalized)  Muscle weakness  Rationale for Evaluation and Treatment Rehabilitation  SUBJECTIVE:  SUBJECTIVE STATEMENT: I am tired were at the mall with the kids all day so did a a lot of walking. Fell yesterday bc I tripped on the corner of a box.   Pt accompanied by: self  PERTINENT HISTORY: Brief HPI:   Megan Reid is a 56 y.o. female with history of DDD lumbar spine, asthma, autoimmune disease, Sjogren's with recent onset of trigeminal neuralgia and was started on Tegretol.  She was also reported to have been taking ibuprofen as well as Tylenol for pain management.  She was found to have metabolic encephalopathy with acute hepatic and renal failure.  Renal failure treated with IV fluids and bicarb .  AKI felt to be due to ATN from acute illness, diarrhea and ibuprofen use.  She developed fever past admission and was treated with IV antibiotics due to concerns of sepsis.  Work-up was negative for DR ESS, tickborne illness, CMV, EBV and acute hepatitis/pancytopenia felt to be secondary to Tegretol toxicity.  She did not have pancytopenia and supportive  care was recommended by heme-onc.  Renal status and abnormal LFTs were resolving however patient continued limited by weakness, DOE as well as encephalopathy with delayed processing.  CIR was recommended due to functional decline.     Hospital Course: Megan Reid was admitted to rehab 12/12/2021 for inpatient therapies to consist of PT, ST and OT at least three hours five days a week. Past admission physiatrist, therapy team and rehab RN have worked together to provide customized collaborative inpatient rehab.  Her blood pressures were monitored on TID basis and has been stable.  Follow-up check of electrolytes revealed renal status as well as abnormal LFTs to be gradually improving.  She was found to have drop in magnesium to 1.3 requiring IV supplementation as well as p.o. supplement.  Repeat check showed improvement with magnesium of 2.0.  Recommend repeat check in 1 to 2 weeks to monitor for stability as well as need to continue oral supplementation.   Follow-up CBC showed improvement in white count with stable H&H and that thrombocytopenia had resolved.  Her alertness and mentation has gradually improved with improvement in activity tolerance and respiratory status.  Hydroxyzine 10 mg 3 times daily as needed was added to help manage anxiety.  Sleep-wake disruption is improved with addition of melatonin.  Constipation has resolved with augmentation of bowel regimen.  She has made steady gains during her rehab stay and is modified independent at discharge.  Supervision is recommended with cognitive task and for safety.  She will continue to receive follow-up outpatient PT, OT and ST at [facility] outpatient rehab after discharge.     Rehab course: During patient's stay in rehab weekly team conferences were held to monitor patient's progress, set goals and discuss barriers to discharge. At admission, patient required min assist with mobility and with ADL tasks.  She exhibited mild cognitive deficits  affecting recall, memory and executive function with SLUMS score 22/30. She  has had improvement in activity tolerance, balance, postural control as well as ability to compensate for deficits. She is able to complete ADL tasks at modified independent level. She is independent for transfers and is able to ambulate 150' without AD. She is able to climb 12 stairs independently. She requires supervision with functional and complex cognitive tasks. Her SLUMS score has improved to 26/30. Family education has been completed.    Disposition: Home  PAIN:  Are you having pain? No  PRECAUTIONS: Fall  WEIGHT BEARING RESTRICTIONS No  FALLS: Has patient fallen in last 6 months? Yes. Number of falls 1  LIVING ENVIRONMENT: Lives with: lives with their spouse Lives in: House/apartment Stairs: 4 STE B rails, home is handicap accessible except for bathtub  Has following equipment at home: None  PLOF: Independent and Independent with basic ADLs  PATIENT GOALS build strength and balance, endurance   OBJECTIVE:   DIAGNOSTIC FINDINGS:   COGNITION: Overall cognitive status: Impaired- memory issues after acute medical problem    SENSATION: Not tested   LOWER EXTREMITY ROM:     Active  Right Eval Left Eval  Hip flexion    Hip extension    Hip abduction    Hip adduction    Hip internal rotation    Hip external rotation    Knee flexion    Knee extension    Ankle dorsiflexion    Ankle plantarflexion    Ankle inversion    Ankle eversion     (Blank rows = not tested)  LOWER EXTREMITY MMT:    MMT Right Eval Left Eval  Hip flexion 3 3  Hip extension 2 2  Hip abduction 3 4-  Hip adduction    Hip internal rotation    Hip external rotation    Knee flexion 4 4  Knee extension 4 4  Ankle dorsiflexion 3 3  Ankle plantarflexion    Ankle inversion    Ankle eversion    (Blank rows = not tested)  BED MOBILITY:  Sit to supine Complete Independence Supine to sit Complete  Independence Rolling to Right Complete Independence Rolling to Left Complete Independence  TRANSFERS: Assistive device utilized: None  Sit to stand: Complete Independence Stand to sit: Complete Independence Chair to chair: Complete Independence Floor:    GAIT: Gait pattern: WFL and trendelenburg Distance walked: 658f  Assistive device utilized: None Level of assistance: Complete Independence Comments: WNL   FUNCTIONAL TESTs:  Berg Balance Scale: 42/56 2MWT [id number]  TODAY'S TREATMENT:  03/23/22 NuStep L5x6m81m  Leg ext 10# 2x10 HS curls 25# 2x10 Step ups on double airex x10 each side Tandem stance on foam hitting ball  Standing on BOSU catching ball  STS with blue ball push out 2x10 on airex   03/16/22 Bike with power bursts every 2mi90m L4x6min74m10" step ups x10 each side  4 way square x5 clockwise and then counterclockwise---then with direction changes  Side steps on BOSU Mini squats on BOSU 2x10 Side steps on beam with red ball catch Tandem walking on beam   03/14/22 NuStep L5x6mins59msisted gait with step up 6" 30# x5 each side Resisted gait with side step up 30# x5 each side  Leg press 40# 2x10 SL stance 8s at best on L, 10s on R  SL catch-- unable to hold for more than 3-4s STS with OHP 2x10   03/07/22 Elliptical L2 x 4mins 24mStep L5 x5mins  19metching HS, glutes, IT band 30s each  Leg ext 15# 2x10 HS curls 35# 2x10 Manual traction to BLE, pt states it relieves her N/T   03/02/22 Bike L4 x4mins  U53mL4 x4 mins  Stretching HS, piriformis, glutes, SK2C 30s each  Manual traction of BLE  Standing on BOSU blue side catch 5 catches at best  Leg press 40# 2x10  Alternating arm and leg lifts x10  11/9C37 R62/8/31ustep L5x6mins [id number] with red ball toss 2x10  Leg ext 15# 2x10 HS curls 35# 2x10 Lumbar traction 85# 10mins   79m/23  Walk outside Bike L4 x4 mins with power bursts x2  Hip abd/ext 5# 2x10 Standing on airex feet together  catch red ball, tried SL but too difficult pt states it is bc she is tired Rows and Lats 20# 2x10   PATIENT EDUCATION: Education details: exam findings, POC, HEP  Person educated: Patient Education method: Explanation, Demonstration, and Handouts Education comprehension: verbalized understanding and returned demonstration   HOME EXERCISE PROGRAM: [id number]    GOALS: Goals reviewed with patient? Yes  SHORT TERM GOALS: Target date: 01/30/2022  Will be compliant with appropriate progressive HEP  Baseline: Goal status: MET  2.  Will be able to name at least 3 ways to reduce fall risk at home and in community  Baseline:  Goal status: MET  3.  Will tolerate standing tasks for at least 90 minutes without increase in fatigue  Baseline:  Goal status: IN PROGRESS  4.  Will be able to walk for at least 15 minutes without rest to show improved functional activity tolerance  Baseline: 10 mins- 11/2 Goal status: IN PROGRESS    LONG TERM GOALS: Target date: 04/20/21  MMT to improve by at least 1 grade in all weak groups  Baseline:  Goal status: ongoing  2.  Will score at least 50 on the Berg to show improved functional balance  Baseline: 53- 11/2 Goal status: MET  3.  Will tolerate at least 1/2 day at work without fatigue to show improved functional activity tolerance  Goal status: MET  4.  Will be independent with appropriate gym based exercise program to maintain functional gains and continue progress on an independent basis  Baseline:  Goal status: IN PROGRESS   ASSESSMENT:  CLINICAL IMPRESSION: Patient is doing well overall. Continued to work on balance and general overall endurance. Some difficulty with balance on uneven surfaces but continues to show good progress with high level balance tasks.  OBJECTIVE IMPAIRMENTS cardiopulmonary status limiting activity, decreased activity tolerance, decreased balance, decreased cognition, decreased mobility, difficulty walking,  decreased strength, and improper body mechanics.   ACTIVITY LIMITATIONS standing, squatting, stairs, transfers, bed mobility, locomotion level, and caring for others  PARTICIPATION LIMITATIONS: community activity, occupation, and yard work  PERSONAL FACTORS Age, Fitness, Past/current experiences, and Time since onset of injury/illness/exacerbation are also affecting patient's functional outcome.   REHAB POTENTIAL: Good  CLINICAL DECISION MAKING: Evolving/moderate complexity  EVALUATION COMPLEXITY: Moderate  PLAN: PT FREQUENCY: 2x/week  PT DURATION: 8 weeks  PLANNED INTERVENTIONS: Therapeutic exercises, Therapeutic activity, Neuromuscular re-education, Balance training, Gait training, Patient/Family education, Self Care, Joint mobilization, Stair training, Dry Needling, Cognitive remediation, Cryotherapy, Moist heat, Taping, Traction, Ultrasound, Ionotophoresis 31m/ml Dexamethasone, Manual therapy, and Re-evaluation  PLAN FOR NEXT SESSION: Assess tolerance to HEP, PN-re-assessment  MAndris Baumann DPT  03/23/22 6:03 PM  03/23/2022, 6:03 PM

## 2022-03-23 ENCOUNTER — Ambulatory Visit: Payer: BC Managed Care – PPO | Attending: Physical Medicine and Rehabilitation

## 2022-03-23 DIAGNOSIS — M545 Low back pain, unspecified: Secondary | ICD-10-CM | POA: Diagnosis present

## 2022-03-23 DIAGNOSIS — G8929 Other chronic pain: Secondary | ICD-10-CM | POA: Insufficient documentation

## 2022-03-23 DIAGNOSIS — R2681 Unsteadiness on feet: Secondary | ICD-10-CM | POA: Insufficient documentation

## 2022-03-23 DIAGNOSIS — R278 Other lack of coordination: Secondary | ICD-10-CM | POA: Insufficient documentation

## 2022-03-23 DIAGNOSIS — M6281 Muscle weakness (generalized): Secondary | ICD-10-CM | POA: Insufficient documentation

## 2022-03-24 ENCOUNTER — Other Ambulatory Visit (HOSPITAL_COMMUNITY): Payer: BC Managed Care – PPO

## 2022-03-28 ENCOUNTER — Ambulatory Visit: Payer: BC Managed Care – PPO

## 2022-03-28 DIAGNOSIS — R2681 Unsteadiness on feet: Secondary | ICD-10-CM

## 2022-03-28 DIAGNOSIS — R278 Other lack of coordination: Secondary | ICD-10-CM | POA: Diagnosis not present

## 2022-03-28 DIAGNOSIS — M6281 Muscle weakness (generalized): Secondary | ICD-10-CM

## 2022-03-28 NOTE — Therapy (Signed)
OUTPATIENT PHYSICAL THERAPY NEURO TREATMENT   Patient Name: Megan Reid MRN: [id number] DOB:[birth year], 56 y.o., female Today's Date: 03/28/2022   PCP: Leeroy Cha  REFERRING PROVIDER: Gertie Gowda, DO    PT End of Session - 03/28/22 1809     Visit Number 18    Date for PT Re-Evaluation 04/20/22    PT Start Time 1809    PT Stop Time 1845    PT Time Calculation (min) 36 min    Activity Tolerance Patient tolerated treatment well;Patient limited by fatigue    Behavior During Therapy [facility] for tasks assessed/performed                 Past Medical History:  Diagnosis Date   Arthralgia 02/14/2016   Asthma    Autoimmune disease ([place]) 02/14/2016   Positive RF 139 CCP Negative  Positive ANA 1:640 Positive Ro Positive [place] Elevated ESR 105 - Sjorgen's Disease   DDD (degenerative disc disease), lumbar 02/14/2016   Fatigue 02/14/2016   Gastritis 02/14/2016   Hot flashes    Migraines 02/14/2016   Osteoarthritis of both hands 02/14/2016   Osteoarthritis of both knees 02/14/2016   Vitamin D deficiency 02/14/2016   Past Surgical History:  Procedure Laterality Date   BREAST BIOPSY Left    ROTATOR CUFF REPAIR Right 06/16/2019   TUBAL LIGATION     Patient Active Problem List   Diagnosis Date Noted   Gastroesophageal reflux disease 03/08/2022   Decreased mobility and endurance 01/25/2022   Difficulty sleeping 01/25/2022   Dairy product intolerance 01/25/2022   Mild cognitive impairment 01/25/2022   Paresthesia of left leg 01/25/2022   Anemia 12/20/2021   Abnormal LFTs 56/25/6389   Toxic metabolic encephalopathy 37/34/2876   Thrombocytopenia ([place]) 12/04/2021   Seasonal and perennial allergic rhinitis 12/02/2020   Chronic coughing 12/02/2020   Sjogren's syndrome ([place]) 12/02/2020   Leukopenia 12/02/2020   Adverse reaction to food, subsequent encounter 06/23/2020   Heartburn 06/23/2020   Bruising 06/23/2020   Abrasion of right ankle 02/13/2017    Rheumatoid factor positive 09/01/2016   Leucopenia 09/01/2016   Autoimmune disease ([place]) 02/14/2016   Fatigue 02/14/2016   DDD (degenerative disc disease), lumbar 02/14/2016   Osteoarthritis of both knees 02/14/2016   Osteoarthritis of both hands 02/14/2016   Asthma 02/14/2016   Migraines 02/14/2016   Gastritis 02/14/2016   Vitamin D deficiency 02/14/2016   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[place]) 02/14/2016   High risk medication use 02/14/2016    ONSET DATE: 12/21/2021   REFERRING DIAG: G92.8 (OTL-57-[place]) - Toxic metabolic encephalopathy   THERAPY DIAG:  Other lack of coordination  Unsteadiness on feet  Muscle weakness (generalized)  Muscle weakness  Rationale for Evaluation and Treatment Rehabilitation  SUBJECTIVE:  SUBJECTIVE STATEMENT: I am doing good.  Pt accompanied by: self  PERTINENT HISTORY: Brief HPI:   Megan Reid is a 56 y.o. female with history of DDD lumbar spine, asthma, autoimmune disease, Sjogren's with recent onset of trigeminal neuralgia and was started on Tegretol.  She was also reported to have been taking ibuprofen as well as Tylenol for pain management.  She was found to have metabolic encephalopathy with acute hepatic and renal failure.  Renal failure treated with IV fluids and bicarb .  AKI felt to be due to ATN from acute illness, diarrhea and ibuprofen use.  She developed fever past admission and was treated with IV antibiotics due to concerns of sepsis.  Work-up was negative for DR ESS, tickborne illness, CMV, EBV and acute hepatitis/pancytopenia felt to be secondary to Tegretol toxicity.  She did not have pancytopenia and supportive care was recommended by heme-onc.  Renal status and abnormal LFTs were resolving however patient continued limited  by weakness, DOE as well as encephalopathy with delayed processing.  CIR was recommended due to functional decline.     Hospital Course: Megan Reid was admitted to rehab 12/12/2021 for inpatient therapies to consist of PT, ST and OT at least three hours five days a week. Past admission physiatrist, therapy team and rehab RN have worked together to provide customized collaborative inpatient rehab.  Her blood pressures were monitored on TID basis and has been stable.  Follow-up check of electrolytes revealed renal status as well as abnormal LFTs to be gradually improving.  She was found to have drop in magnesium to 1.3 requiring IV supplementation as well as p.o. supplement.  Repeat check showed improvement with magnesium of 2.0.  Recommend repeat check in 1 to 2 weeks to monitor for stability as well as need to continue oral supplementation.   Follow-up CBC showed improvement in white count with stable H&H and that thrombocytopenia had resolved.  Her alertness and mentation has gradually improved with improvement in activity tolerance and respiratory status.  Hydroxyzine 10 mg 3 times daily as needed was added to help manage anxiety.  Sleep-wake disruption is improved with addition of melatonin.  Constipation has resolved with augmentation of bowel regimen.  She has made steady gains during her rehab stay and is modified independent at discharge.  Supervision is recommended with cognitive task and for safety.  She will continue to receive follow-up outpatient PT, OT and ST at [facility] outpatient rehab after discharge.     Rehab course: During patient's stay in rehab weekly team conferences were held to monitor patient's progress, set goals and discuss barriers to discharge. At admission, patient required min assist with mobility and with ADL tasks.  She exhibited mild cognitive deficits affecting recall, memory and executive function with SLUMS score 22/30. She  has had improvement in activity  tolerance, balance, postural control as well as ability to compensate for deficits. She is able to complete ADL tasks at modified independent level. She is independent for transfers and is able to ambulate 150' without AD. She is able to climb 12 stairs independently. She requires supervision with functional and complex cognitive tasks. Her SLUMS score has improved to 26/30. Family education has been completed.    Disposition: Home  PAIN:  Are you having pain? No  PRECAUTIONS: Fall  WEIGHT BEARING RESTRICTIONS No  FALLS: Has patient fallen in last 6 months? Yes. Number of falls 1  LIVING ENVIRONMENT: Lives with: lives with their spouse Lives in: House/apartment Stairs:  4 STE B rails, home is handicap accessible except for bathtub  Has following equipment at home: None  PLOF: Independent and Independent with basic ADLs  PATIENT GOALS build strength and balance, endurance   OBJECTIVE:   DIAGNOSTIC FINDINGS:   COGNITION: Overall cognitive status: Impaired- memory issues after acute medical problem    SENSATION: Not tested   LOWER EXTREMITY ROM:     Active  Right Eval Left Eval  Hip flexion    Hip extension    Hip abduction    Hip adduction    Hip internal rotation    Hip external rotation    Knee flexion    Knee extension    Ankle dorsiflexion    Ankle plantarflexion    Ankle inversion    Ankle eversion     (Blank rows = not tested)  LOWER EXTREMITY MMT:    MMT Right Eval Left Eval  Hip flexion 3 3  Hip extension 2 2  Hip abduction 3 4-  Hip adduction    Hip internal rotation    Hip external rotation    Knee flexion 4 4  Knee extension 4 4  Ankle dorsiflexion 3 3  Ankle plantarflexion    Ankle inversion    Ankle eversion    (Blank rows = not tested)  BED MOBILITY:  Sit to supine Complete Independence Supine to sit Complete Independence Rolling to Right Complete Independence Rolling to Left Complete Independence  TRANSFERS: Assistive device  utilized: None  Sit to stand: Complete Independence Stand to sit: Complete Independence Chair to chair: Complete Independence Floor:    GAIT: Gait pattern: WFL and trendelenburg Distance walked: 677f  Assistive device utilized: None Level of assistance: Complete Independence Comments: WNL   FUNCTIONAL TESTs:  Berg Balance Scale: 42/56 2MWT [id number]  TODAY'S TREATMENT:  03/28/22 Treadmill 3% 13m3m5 mins  Step ups holding 5# dumbbells 6" x10 each side, and then with high knee x10  Leg press 50# 3x10  Side steps into box and onto airex  Obstacle course walking on beam narrow, step up 10", walking on beam side steps SLS 10s at best  03/23/22 NuStep L5x6mi44m Leg ext 10# 2x10 HS curls 25# 2x10 Step ups on double airex x10 each side Tandem stance on foam hitting ball  Standing on BOSU catching ball  STS with blue ball push out 2x10 on airex   03/16/22 Bike with power bursts every [id number]" step ups x10 each side  4 way square x5 clockwise and then counterclockwise---then with direction changes  Side steps on BOSU Mini squats on BOSU 2x10 Side steps on beam with red ball catch Tandem walking on beam   03/14/22 NuStep L5x6mins 8misted gait with step up 6" 30# x5 each side Resisted gait with side step up 30# x5 each side  Leg press 40# 2x10 SL stance 8s at best on L, 10s on R  SL catch-- unable to hold for more than 3-4s STS with OHP 2x10   03/07/22 Elliptical L2 x 4mins  73mtep L5 x5mins  S613mtching HS, glutes, IT band 30s each  Leg ext 15# 2x10 HS curls 35# 2x10 Manual traction to BLE, pt states it relieves her N/T   03/02/22 Bike L4 x4mins  UB91m4 x4 mins  Stretching HS, piriformis, glutes, SK2C 30s each  Manual traction of BLE  Standing on BOSU blue side catch 5 catches at best  Leg press 40# 2x10  Alternating arm and leg lifts x10  95/6/21 Recert Nustep [id number]  STS on airex with red ball toss 2x10  Leg ext 15# 2x10 HS curls 35#  2x10 Lumbar traction 85# 15mns   02/21/22 Walk outside Bike L4 x4 mins with power bursts x2  Hip abd/ext 5# 2x10 Standing on airex feet together catch red ball, tried SL but too difficult pt states it is bc she is tired Rows and Lats 20# 2x10   PATIENT EDUCATION: Education details: exam findings, POC, HEP  Person educated: Patient Education method: Explanation, Demonstration, and Handouts Education comprehension: verbalized understanding and returned demonstration   HOME EXERCISE PROGRAM: [id number]    GOALS: Goals reviewed with patient? Yes  SHORT TERM GOALS: Target date: 01/30/2022  Will be compliant with appropriate progressive HEP  Baseline: Goal status: MET  2.  Will be able to name at least 3 ways to reduce fall risk at home and in community  Baseline:  Goal status: MET  3.  Will tolerate standing tasks for at least 90 minutes without increase in fatigue  Baseline:  Goal status: IN PROGRESS  4.  Will be able to walk for at least 15 minutes without rest to show improved functional activity tolerance  Baseline: 10 mins- 11/2 Goal status: IN PROGRESS    LONG TERM GOALS: Target date: 04/20/21  MMT to improve by at least 1 grade in all weak groups  Baseline:  Goal status: ongoing  2.  Will score at least 50 on the Berg to show improved functional balance  Baseline: 53- 11/2 Goal status: MET  3.  Will tolerate at least 1/2 day at work without fatigue to show improved functional activity tolerance  Goal status: MET  4.  Will be independent with appropriate gym based exercise program to maintain functional gains and continue progress on an independent basis  Baseline:  Goal status: IN PROGRESS   ASSESSMENT:  CLINICAL IMPRESSION: Patient is doing well overall, arrive ~10 mins late so limited time today. Continued to work on balance and general overall endurance. We worked on some single leg balance tasks and obstacle course. Tolerates more weight and reps  on leg press today.  OBJECTIVE IMPAIRMENTS cardiopulmonary status limiting activity, decreased activity tolerance, decreased balance, decreased cognition, decreased mobility, difficulty walking, decreased strength, and improper body mechanics.   ACTIVITY LIMITATIONS standing, squatting, stairs, transfers, bed mobility, locomotion level, and caring for others  PARTICIPATION LIMITATIONS: community activity, occupation, and yard work  PERSONAL FACTORS Age, Fitness, Past/current experiences, and Time since onset of injury/illness/exacerbation are also affecting patient's functional outcome.   REHAB POTENTIAL: Good  CLINICAL DECISION MAKING: Evolving/moderate complexity  EVALUATION COMPLEXITY: Moderate  PLAN: PT FREQUENCY: 2x/week  PT DURATION: 8 weeks  PLANNED INTERVENTIONS: Therapeutic exercises, Therapeutic activity, Neuromuscular re-education, Balance training, Gait training, Patient/Family education, Self Care, Joint mobilization, Stair training, Dry Needling, Cognitive remediation, Cryotherapy, Moist heat, Taping, Traction, Ultrasound, Ionotophoresis 424mml Dexamethasone, Manual therapy, and Re-evaluation  PLAN FOR NEXT SESSION: Assess tolerance to HEP, PN-re-assessment  MoAndris BaumannDPT  03/28/22 6:45 PM  03/28/2022, 6:45 PM

## 2022-03-29 ENCOUNTER — Ambulatory Visit: Payer: BC Managed Care – PPO

## 2022-03-29 DIAGNOSIS — R278 Other lack of coordination: Secondary | ICD-10-CM

## 2022-03-29 DIAGNOSIS — R2681 Unsteadiness on feet: Secondary | ICD-10-CM

## 2022-03-29 DIAGNOSIS — M6281 Muscle weakness (generalized): Secondary | ICD-10-CM

## 2022-03-29 NOTE — Therapy (Signed)
OUTPATIENT PHYSICAL THERAPY NEURO TREATMENT   Patient Name: Megan Reid MRN: [id number] DOB:07/02/1965, 56 y.o., female Today's Date: 03/29/2022   PCP: Leeroy Cha  REFERRING PROVIDER: Gertie Gowda, DO    PT End of Session - 03/29/22 1702     Visit Number 19    Date for PT Re-Evaluation 04/20/22    PT Start Time 1700    PT Stop Time 1745    PT Time Calculation (min) 45 min    Activity Tolerance Patient tolerated treatment well;Patient limited by fatigue    Behavior During Therapy [facility] for tasks assessed/performed                  Past Medical History:  Diagnosis Date   Arthralgia 02/14/2016   Asthma    Autoimmune disease ([place]) 02/14/2016   Positive RF 139 CCP Negative  Positive ANA 1:640 Positive Ro Positive [place] Elevated ESR 105 - Sjorgen's Disease   DDD (degenerative disc disease), lumbar 02/14/2016   Fatigue 02/14/2016   Gastritis 02/14/2016   Hot flashes    Migraines 02/14/2016   Osteoarthritis of both hands 02/14/2016   Osteoarthritis of both knees 02/14/2016   Vitamin D deficiency 02/14/2016   Past Surgical History:  Procedure Laterality Date   BREAST BIOPSY Left    ROTATOR CUFF REPAIR Right 06/16/2019   TUBAL LIGATION     Patient Active Problem List   Diagnosis Date Noted   Gastroesophageal reflux disease 03/08/2022   Decreased mobility and endurance 01/25/2022   Difficulty sleeping 01/25/2022   Dairy product intolerance 01/25/2022   Mild cognitive impairment 01/25/2022   Paresthesia of left leg 01/25/2022   Anemia 12/20/2021   Abnormal LFTs 28/31/5176   Toxic metabolic encephalopathy 16/10/3708   Thrombocytopenia ([place]) 12/04/2021   Seasonal and perennial allergic rhinitis 12/02/2020   Chronic coughing 12/02/2020   Sjogren's syndrome ([place]) 12/02/2020   Leukopenia 12/02/2020   Adverse reaction to food, subsequent encounter 06/23/2020   Heartburn 06/23/2020   Bruising 06/23/2020   Abrasion of right ankle 02/13/2017    Rheumatoid factor positive 09/01/2016   Leucopenia 09/01/2016   Autoimmune disease ([place]) 02/14/2016   Fatigue 02/14/2016   DDD (degenerative disc disease), lumbar 02/14/2016   Osteoarthritis of both knees 02/14/2016   Osteoarthritis of both hands 02/14/2016   Asthma 02/14/2016   Migraines 02/14/2016   Gastritis 02/14/2016   Vitamin D deficiency 02/14/2016   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[place]) 02/14/2016   High risk medication use 02/14/2016    ONSET DATE: 12/21/2021   REFERRING DIAG: G92.8 ([id number]) - Toxic metabolic encephalopathy   THERAPY DIAG:  Other lack of coordination  Unsteadiness on feet  Muscle weakness (generalized)  Muscle weakness  Rationale for Evaluation and Treatment Rehabilitation  SUBJECTIVE:  SUBJECTIVE STATEMENT: Doing fine, tired.   Pt accompanied by: self  PERTINENT HISTORY: Brief HPI:   Megan Reid is a 56 y.o. female with history of DDD lumbar spine, asthma, autoimmune disease, Sjogren's with recent onset of trigeminal neuralgia and was started on Tegretol.  She was also reported to have been taking ibuprofen as well as Tylenol for pain management.  She was found to have metabolic encephalopathy with acute hepatic and renal failure.  Renal failure treated with IV fluids and bicarb .  AKI felt to be due to ATN from acute illness, diarrhea and ibuprofen use.  She developed fever past admission and was treated with IV antibiotics due to concerns of sepsis.  Work-up was negative for DR ESS, tickborne illness, CMV, EBV and acute hepatitis/pancytopenia felt to be secondary to Tegretol toxicity.  She did not have pancytopenia and supportive care was recommended by heme-onc.  Renal status and abnormal LFTs were resolving however patient continued  limited by weakness, DOE as well as encephalopathy with delayed processing.  CIR was recommended due to functional decline.     Hospital Course: AZUREE MINISH was admitted to rehab 12/12/2021 for inpatient therapies to consist of PT, ST and OT at least three hours five days a week. Past admission physiatrist, therapy team and rehab RN have worked together to provide customized collaborative inpatient rehab.  Her blood pressures were monitored on TID basis and has been stable.  Follow-up check of electrolytes revealed renal status as well as abnormal LFTs to be gradually improving.  She was found to have drop in magnesium to 1.3 requiring IV supplementation as well as p.o. supplement.  Repeat check showed improvement with magnesium of 2.0.  Recommend repeat check in 1 to 2 weeks to monitor for stability as well as need to continue oral supplementation.   Follow-up CBC showed improvement in white count with stable H&H and that thrombocytopenia had resolved.  Her alertness and mentation has gradually improved with improvement in activity tolerance and respiratory status.  Hydroxyzine 10 mg 3 times daily as needed was added to help manage anxiety.  Sleep-wake disruption is improved with addition of melatonin.  Constipation has resolved with augmentation of bowel regimen.  She has made steady gains during her rehab stay and is modified independent at discharge.  Supervision is recommended with cognitive task and for safety.  She will continue to receive follow-up outpatient PT, OT and ST at [facility] outpatient rehab after discharge.     Rehab course: During patient's stay in rehab weekly team conferences were held to monitor patient's progress, set goals and discuss barriers to discharge. At admission, patient required min assist with mobility and with ADL tasks.  She exhibited mild cognitive deficits affecting recall, memory and executive function with SLUMS score 22/30. She  has had improvement in activity  tolerance, balance, postural control as well as ability to compensate for deficits. She is able to complete ADL tasks at modified independent level. She is independent for transfers and is able to ambulate 150' without AD. She is able to climb 12 stairs independently. She requires supervision with functional and complex cognitive tasks. Her SLUMS score has improved to 26/30. Family education has been completed.    Disposition: Home  PAIN:  Are you having pain? No  PRECAUTIONS: Fall  WEIGHT BEARING RESTRICTIONS No  FALLS: Has patient fallen in last 6 months? Yes. Number of falls 1  LIVING ENVIRONMENT: Lives with: lives with their spouse Lives in: House/apartment Stairs:  4 STE B rails, home is handicap accessible except for bathtub  Has following equipment at home: None  PLOF: Independent and Independent with basic ADLs  PATIENT GOALS build strength and balance, endurance   OBJECTIVE:   DIAGNOSTIC FINDINGS:   COGNITION: Overall cognitive status: Impaired- memory issues after acute medical problem    SENSATION: Not tested   LOWER EXTREMITY ROM:     Active  Right Eval Left Eval  Hip flexion    Hip extension    Hip abduction    Hip adduction    Hip internal rotation    Hip external rotation    Knee flexion    Knee extension    Ankle dorsiflexion    Ankle plantarflexion    Ankle inversion    Ankle eversion     (Blank rows = not tested)  LOWER EXTREMITY MMT:    MMT Right Eval Left Eval  Hip flexion 3 3  Hip extension 2 2  Hip abduction 3 4-  Hip adduction    Hip internal rotation    Hip external rotation    Knee flexion 4 4  Knee extension 4 4  Ankle dorsiflexion 3 3  Ankle plantarflexion    Ankle inversion    Ankle eversion    (Blank rows = not tested)  BED MOBILITY:  Sit to supine Complete Independence Supine to sit Complete Independence Rolling to Right Complete Independence Rolling to Left Complete Independence  TRANSFERS: Assistive device  utilized: None  Sit to stand: Complete Independence Stand to sit: Complete Independence Chair to chair: Complete Independence Floor:    GAIT: Gait pattern: WFL and trendelenburg Distance walked: 67f  Assistive device utilized: None Level of assistance: Complete Independence Comments: WNL   FUNCTIONAL TESTs:  Berg Balance Scale: 42/56 2MWT 6728f  TODAY'S TREATMENT:  03/29/22 Elliptical L2 x4 mins  UBE L3 x4 mins Calf stretch 30s  STS on airex w/shoulder flexion 3# 2x10  Alternating leg press jumps 20# 2x10 switches  SL cone taps  Stretching HS, SKTC, glutes 30s each  03/28/22 Treadmill 3% 21m59m5 mins  Step ups holding 5# dumbbells 6" x10 each side, and then with high knee x10  Leg press 50# 3x10  Side steps into box and onto airex  Obstacle course walking on beam narrow, step up 10", walking on beam side steps SLS 10s at best  03/23/22 NuStep L5x6mi78m Leg ext 10# 2x10 HS curls 25# 2x10 Step ups on double airex x10 each side Tandem stance on foam hitting ball  Standing on BOSU catching ball  STS with blue ball push out 2x10 on airex   03/16/22 Bike with power bursts every 21min16mL4x6mins28m0" step ups x10 each side  4 way square x5 clockwise and then counterclockwise---then with direction changes  Side steps on BOSU Mini squats on BOSU 2x10 Side steps on beam with red ball catch Tandem walking on beam   03/14/22 NuStep L5x6mins 69misted gait with step up 6" 30# x5 each side Resisted gait with side step up 30# x5 each side  Leg press 40# 2x10 SL stance 8s at best on L, 10s on R  SL catch-- unable to hold for more than 3-4s STS with OHP 2x10   03/07/22 Elliptical L2 x 4mins  15mtep L5 x5mins  S621mtching HS, glutes, IT band 30s each  Leg ext 15# 2x10 HS curls 35# 2x10 Manual traction to BLE, pt states it relieves her N/T   03/02/22 Bike L4 x4mins37m  UBE L4 x4 mins  Stretching HS, piriformis, glutes, SK2C 30s each  Manual traction of BLE   Standing on BOSU blue side catch 5 catches at best  Leg press 40# 2x10  Alternating arm and leg lifts Z61  12/22/02 Recert Nustep [id number]  STS on airex with red ball toss 2x10  Leg ext 15# 2x10 HS curls 35# 2x10 Lumbar traction 85# 74mns   02/21/22 Walk outside Bike L4 x4 mins with power bursts x2  Hip abd/ext 5# 2x10 Standing on airex feet together catch red ball, tried SL but too difficult pt states it is bc she is tired Rows and Lats 20# 2x10   PATIENT EDUCATION: Education details: exam findings, POC, HEP  Person educated: Patient Education method: Explanation, Demonstration, and Handouts Education comprehension: verbalized understanding and returned demonstration   HOME EXERCISE PROGRAM: [id number]    GOALS: Goals reviewed with patient? Yes  SHORT TERM GOALS: Target date: 01/30/2022  Will be compliant with appropriate progressive HEP  Baseline: Goal status: MET  2.  Will be able to name at least 3 ways to reduce fall risk at home and in community  Baseline:  Goal status: MET  3.  Will tolerate standing tasks for at least 90 minutes without increase in fatigue  Baseline:  Goal status: IN PROGRESS  4.  Will be able to walk for at least 15 minutes without rest to show improved functional activity tolerance  Baseline: 10 mins- 11/2 Goal status: IN PROGRESS    LONG TERM GOALS: Target date: 04/20/21  MMT to improve by at least 1 grade in all weak groups  Baseline:  Goal status: ongoing  2.  Will score at least 50 on the Berg to show improved functional balance  Baseline: 53- 11/2 Goal status: MET  3.  Will tolerate at least 1/2 day at work without fatigue to show improved functional activity tolerance  Goal status: MET  4.  Will be independent with appropriate gym based exercise program to maintain functional gains and continue progress on an independent basis  Baseline:  Goal status: IN PROGRESS   ASSESSMENT:  CLINICAL IMPRESSION: Difficulty  and frustration with single leg cone taps, modified to make it easier by moving cones closer. Able to do one time on each leg. Lots of tightness with stretches today.    OBJECTIVE IMPAIRMENTS cardiopulmonary status limiting activity, decreased activity tolerance, decreased balance, decreased cognition, decreased mobility, difficulty walking, decreased strength, and improper body mechanics.   ACTIVITY LIMITATIONS standing, squatting, stairs, transfers, bed mobility, locomotion level, and caring for others  PARTICIPATION LIMITATIONS: community activity, occupation, and yard work  PERSONAL FACTORS Age, Fitness, Past/current experiences, and Time since onset of injury/illness/exacerbation are also affecting patient's functional outcome.   REHAB POTENTIAL: Good  CLINICAL DECISION MAKING: Evolving/moderate complexity  EVALUATION COMPLEXITY: Moderate  PLAN: PT FREQUENCY: 2x/week  PT DURATION: 8 weeks  PLANNED INTERVENTIONS: Therapeutic exercises, Therapeutic activity, Neuromuscular re-education, Balance training, Gait training, Patient/Family education, Self Care, Joint mobilization, Stair training, Dry Needling, Cognitive remediation, Cryotherapy, Moist heat, Taping, Traction, Ultrasound, Ionotophoresis 421mml Dexamethasone, Manual therapy, and Re-evaluation  PLAN FOR NEXT SESSION: Assess tolerance to HEP, PN-re-assessment  MoAndris BaumannDPT  03/29/22 5:46 PM  03/29/2022, 5:46 PM

## 2022-04-05 ENCOUNTER — Ambulatory Visit: Payer: BC Managed Care – PPO

## 2022-04-05 DIAGNOSIS — R278 Other lack of coordination: Secondary | ICD-10-CM | POA: Diagnosis not present

## 2022-04-05 DIAGNOSIS — M6281 Muscle weakness (generalized): Secondary | ICD-10-CM

## 2022-04-05 DIAGNOSIS — R2681 Unsteadiness on feet: Secondary | ICD-10-CM

## 2022-04-05 NOTE — Therapy (Signed)
OUTPATIENT PHYSICAL THERAPY NEURO TREATMENT   Patient Name: Megan Reid MRN: [id number] DOB:19-Nov-1965, 56 y.o., female Today's Date: 04/05/2022   PCP: Leeroy Cha  REFERRING PROVIDER: Gertie Gowda, DO    PT End of Session - 04/05/22 1622     Visit Number 20    Date for PT Re-Evaluation 04/20/22    PT Start Time 1622    PT Stop Time 1700    PT Time Calculation (min) 38 min    Activity Tolerance Patient tolerated treatment well;Patient limited by fatigue    Behavior During Therapy [facility] for tasks assessed/performed                  Past Medical History:  Diagnosis Date   Arthralgia 02/14/2016   Asthma    Autoimmune disease ([place]) 02/14/2016   Positive RF 139 CCP Negative  Positive ANA 1:640 Positive Ro Positive [place] Elevated ESR 105 - Sjorgen's Disease   DDD (degenerative disc disease), lumbar 02/14/2016   Fatigue 02/14/2016   Gastritis 02/14/2016   Hot flashes    Migraines 02/14/2016   Osteoarthritis of both hands 02/14/2016   Osteoarthritis of both knees 02/14/2016   Vitamin D deficiency 02/14/2016   Past Surgical History:  Procedure Laterality Date   BREAST BIOPSY Left    ROTATOR CUFF REPAIR Right 06/16/2019   TUBAL LIGATION     Patient Active Problem List   Diagnosis Date Noted   Gastroesophageal reflux disease 03/08/2022   Decreased mobility and endurance 01/25/2022   Difficulty sleeping 01/25/2022   Dairy product intolerance 01/25/2022   Mild cognitive impairment 01/25/2022   Paresthesia of left leg 01/25/2022   Anemia 12/20/2021   Abnormal LFTs 47/15/9539   Toxic metabolic encephalopathy 67/28/9791   Thrombocytopenia ([place]) 12/04/2021   Seasonal and perennial allergic rhinitis 12/02/2020   Chronic coughing 12/02/2020   Sjogren's syndrome ([place]) 12/02/2020   Leukopenia 12/02/2020   Adverse reaction to food, subsequent encounter 06/23/2020   Heartburn 06/23/2020   Bruising 06/23/2020   Abrasion of right ankle 02/13/2017    Rheumatoid factor positive 09/01/2016   Leucopenia 09/01/2016   Autoimmune disease ([place]) 02/14/2016   Fatigue 02/14/2016   DDD (degenerative disc disease), lumbar 02/14/2016   Osteoarthritis of both knees 02/14/2016   Osteoarthritis of both hands 02/14/2016   Asthma 02/14/2016   Migraines 02/14/2016   Gastritis 02/14/2016   Vitamin D deficiency 02/14/2016   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[place]) 02/14/2016   High risk medication use 02/14/2016    ONSET DATE: 12/21/2021   REFERRING DIAG: G92.8 ([facility]) - Toxic metabolic encephalopathy   THERAPY DIAG:  Other lack of coordination  Unsteadiness on feet  Muscle weakness (generalized)  Muscle weakness  Rationale for Evaluation and Treatment Rehabilitation  SUBJECTIVE:  SUBJECTIVE STATEMENT: Doing fine, tired.   Pt accompanied by: self  PERTINENT HISTORY: Brief HPI:   Megan Reid is a 56 y.o. female with history of DDD lumbar spine, asthma, autoimmune disease, Sjogren's with recent onset of trigeminal neuralgia and was started on Tegretol.  She was also reported to have been taking ibuprofen as well as Tylenol for pain management.  She was found to have metabolic encephalopathy with acute hepatic and renal failure.  Renal failure treated with IV fluids and bicarb .  AKI felt to be due to ATN from acute illness, diarrhea and ibuprofen use.  She developed fever past admission and was treated with IV antibiotics due to concerns of sepsis.  Work-up was negative for DR ESS, tickborne illness, CMV, EBV and acute hepatitis/pancytopenia felt to be secondary to Tegretol toxicity.  She did not have pancytopenia and supportive care was recommended by heme-onc.  Renal status and abnormal LFTs were resolving however patient continued  limited by weakness, DOE as well as encephalopathy with delayed processing.  CIR was recommended due to functional decline.     Hospital Course: Megan Reid was admitted to rehab 12/12/2021 for inpatient therapies to consist of PT, ST and OT at least three hours five days a week. Past admission physiatrist, therapy team and rehab RN have worked together to provide customized collaborative inpatient rehab.  Her blood pressures were monitored on TID basis and has been stable.  Follow-up check of electrolytes revealed renal status as well as abnormal LFTs to be gradually improving.  She was found to have drop in magnesium to 1.3 requiring IV supplementation as well as p.o. supplement.  Repeat check showed improvement with magnesium of 2.0.  Recommend repeat check in 1 to 2 weeks to monitor for stability as well as need to continue oral supplementation.   Follow-up CBC showed improvement in white count with stable H&H and that thrombocytopenia had resolved.  Her alertness and mentation has gradually improved with improvement in activity tolerance and respiratory status.  Hydroxyzine 10 mg 3 times daily as needed was added to help manage anxiety.  Sleep-wake disruption is improved with addition of melatonin.  Constipation has resolved with augmentation of bowel regimen.  She has made steady gains during her rehab stay and is modified independent at discharge.  Supervision is recommended with cognitive task and for safety.  She will continue to receive follow-up outpatient PT, OT and ST at [facility] outpatient rehab after discharge.     Rehab course: During patient's stay in rehab weekly team conferences were held to monitor patient's progress, set goals and discuss barriers to discharge. At admission, patient required min assist with mobility and with ADL tasks.  She exhibited mild cognitive deficits affecting recall, memory and executive function with SLUMS score 22/30. She  has had improvement in activity  tolerance, balance, postural control as well as ability to compensate for deficits. She is able to complete ADL tasks at modified independent level. She is independent for transfers and is able to ambulate 150' without AD. She is able to climb 12 stairs independently. She requires supervision with functional and complex cognitive tasks. Her SLUMS score has improved to 26/30. Family education has been completed.    Disposition: Home  PAIN:  Are you having pain? No  PRECAUTIONS: Fall  WEIGHT BEARING RESTRICTIONS No  FALLS: Has patient fallen in last 6 months? Yes. Number of falls 1  LIVING ENVIRONMENT: Lives with: lives with their spouse Lives in: House/apartment Stairs:  4 STE B rails, home is handicap accessible except for bathtub  Has following equipment at home: None  PLOF: Independent and Independent with basic ADLs  PATIENT GOALS build strength and balance, endurance   OBJECTIVE:   DIAGNOSTIC FINDINGS:   COGNITION: Overall cognitive status: Impaired- memory issues after acute medical problem    SENSATION: Not tested   LOWER EXTREMITY ROM:     Active  Right Eval Left Eval  Hip flexion    Hip extension    Hip abduction    Hip adduction    Hip internal rotation    Hip external rotation    Knee flexion    Knee extension    Ankle dorsiflexion    Ankle plantarflexion    Ankle inversion    Ankle eversion     (Blank rows = not tested)  LOWER EXTREMITY MMT:    MMT Right Eval Left Eval Right 04/05/22 Left 04/05/22  Hip flexion _0 Hip extension _1 Hip abduction 3 4- 4 4  Hip adduction      Hip internal rotation      Hip external rotation      Knee flexion _2 4+  Knee extension _3 Ankle dorsiflexion _4 Ankle plantarflexion      Ankle inversion      Ankle eversion      (Blank rows = not tested)  BED MOBILITY:  Sit to supine Complete Independence Supine to sit Complete Independence Rolling to Right Complete  Independence Rolling to Left Complete Independence  TRANSFERS: Assistive device utilized: None  Sit to stand: Complete Independence Stand to sit: Complete Independence Chair to chair: Complete Independence Floor:    GAIT: Gait pattern: WFL and trendelenburg Distance walked: 674f  Assistive device utilized: None Level of assistance: Complete Independence Comments: WNL   FUNCTIONAL TESTs:  Berg Balance Scale: 42/56 2MWT 6738f  TODAY'S TREATMENT:  04/05/22 Recheck goals and MMT  Bike  L3 with power bursts 37m73m UBE L3 with power bursts 4mi24mStep up on 6" + airex 2 way hip 5# 2x10  03/29/22 Elliptical L2 x4 mins  UBE L3 x4 mins Calf stretch 30s  STS on airex w/shoulder flexion 3# 2x10  Alternating leg press jumps 20# 2x10 switches  SL cone taps  Stretching HS, SKTC, glutes 30s each  03/28/22 Treadmill 3% 2mph44mmins  Step ups holding 5# dumbbells 6" x10 each side, and then with high knee x10  Leg press 50# 3x10  Side steps into box and onto airex  Obstacle course walking on beam narrow, step up 10", walking on beam side steps SLS 10s at best  03/23/22 NuStep L5x6mins42meg ext 10# 2x10 HS curls 25# 2x10 Step ups on double airex x10 each side Tandem stance on foam hitting ball  Standing on BOSU catching ball  STS with blue ball push out 2x10 on airex   03/16/22 Bike with power bursts every 2mins-88mx6mins  26m step ups x10 each side  4 way square x5 clockwise and then counterclockwise---then with direction changes  Side steps on BOSU Mini squats on BOSU 2x10 Side steps on beam with red ball catch Tandem walking on beam   03/14/22 NuStep L5x6mins Re53mted gait with step up 6" 30# x5 each side Resisted gait with side step up 30# x5 each side  Leg press 40# 2x10 SL stance 8s at best on L, 10s on R  SL catch-- unable to hold for more than 3-4s STS with OHP 2x10   03/07/22 Elliptical L2 x 74mns  NuStep L5 x551ms  Stretching HS, glutes, IT band 30s  each  Leg ext 15# 2x10 HS curls 35# 2x10 Manual traction to BLE, pt states it relieves her N/T   03/02/22 Bike L4 x4m71m  UBE L4 x4 mins  Stretching HS, piriformis, glutes, SK2C 30s each  Manual traction of BLE  Standing on BOSU blue side catch 5 catches at best  Leg press 40# 2x10  Alternating arm and leg lifts [id number]/40/9/81cert Nustep [id number] on airex with red ball toss 2x10  Leg ext 15# 2x10 HS curls 35# 2x10 Lumbar traction 85# 34m78m  02/21/22 Walk outside Bike L4 x4 mins with power bursts x2  Hip abd/ext 5# 2x10 Standing on airex feet together catch red ball, tried SL but too difficult pt states it is bc she is tired Rows and Lats 20# 2x10   PATIENT EDUCATION: Education details: exam findings, POC, HEP  Person educated: Patient Education method: Explanation, Demonstration, and Handouts Education comprehension: verbalized understanding and returned demonstration   HOME EXERCISE PROGRAM: [id number]    GOALS: Goals reviewed with patient? Yes  SHORT TERM GOALS: Target date: 01/30/2022  Will be compliant with appropriate progressive HEP  Baseline: Goal status: MET  2.  Will be able to name at least 3 ways to reduce fall risk at home and in community  Baseline:  Goal status: MET  3.  Will tolerate standing tasks for at least 90 minutes without increase in fatigue  Baseline:  Goal status: IN PROGRESS  4.  Will be able to walk for at least 15 minutes without rest to show improved functional activity tolerance  Baseline: 10 mins- 11/2, 15 mins-12/20 Goal status: MET    LONG TERM GOALS: Target date: 04/20/21  MMT to improve by at least 1 grade in all weak groups  Baseline:  Goal status: MET  2.  Will score at least 50 on the Berg to show improved functional balance  Baseline: 53- 11/2 Goal status: MET  3.  Will tolerate at least 1/2 day at work without fatigue to show improved functional activity tolerance  Goal status: MET  4.  Will be  independent with appropriate gym based exercise program to maintain functional gains and continue progress on an independent basis  Baseline:  Goal status: IN PROGRESS   ASSESSMENT:  CLINICAL IMPRESSION: Progress note complete, she has met almost all of her goals. She still has some weakness in her hips most notably in her hip extensors. Continued to work on strengthening and endurance. We talked about slowly transition into an independent program as we get close to wrapping up PT.     OBJECTIVE IMPAIRMENTS cardiopulmonary status limiting activity, decreased activity tolerance, decreased balance, decreased cognition, decreased mobility, difficulty walking, decreased strength, and improper body mechanics.   ACTIVITY LIMITATIONS standing, squatting, stairs, transfers, bed mobility, locomotion level, and caring for others  PARTICIPATION LIMITATIONS: community activity, occupation, and yard work  PERSONAL FACTORS Age, Fitness, Past/current experiences, and Time since onset of injury/illness/exacerbation are also affecting patient's functional outcome.   REHAB POTENTIAL: Good  CLINICAL DECISION MAKING: Evolving/moderate complexity  EVALUATION COMPLEXITY: Moderate  PLAN: PT FREQUENCY: 2x/week  PT DURATION: 8 weeks  PLANNED INTERVENTIONS: Therapeutic exercises, Therapeutic activity, Neuromuscular re-education, Balance training, Gait training, Patient/Family education, Self Care, Joint mobilization, Stair training, Dry Needling, Cognitive remediation, Cryotherapy, Moist heat, Taping, Traction, Ultrasound,  Ionotophoresis 67m/ml Dexamethasone, Manual therapy, and Re-evaluation  PLAN FOR NEXT SESSION: Assess tolerance to HEP, PN-re-assessment  MAndris Baumann DPT  04/05/22 5:00 PM  04/05/2022, 5:00 PM

## 2022-04-07 ENCOUNTER — Other Ambulatory Visit: Payer: Self-pay | Admitting: *Deleted

## 2022-04-07 ENCOUNTER — Ambulatory Visit: Payer: BC Managed Care – PPO | Admitting: *Deleted

## 2022-04-07 DIAGNOSIS — R599 Enlarged lymph nodes, unspecified: Secondary | ICD-10-CM

## 2022-04-07 DIAGNOSIS — J4550 Severe persistent asthma, uncomplicated: Secondary | ICD-10-CM

## 2022-04-07 MED ORDER — TEZEPELUMAB-EKKO 210 MG/1.91ML ~~LOC~~ SOSY
210.0000 mg | PREFILLED_SYRINGE | Freq: Once | SUBCUTANEOUS | Status: AC
  Administered 2022-04-07: 210 mg via SUBCUTANEOUS

## 2022-04-07 NOTE — Progress Notes (Signed)
Myrle brought her Tezspire auto-injector in the office to self inject her first dose. I demonstrated and she then self-administered her Tezspire. She had no questions or concerns. Consent signed. No issues after observed wait time.

## 2022-04-11 ENCOUNTER — Ambulatory Visit: Payer: BC Managed Care – PPO

## 2022-04-11 NOTE — Therapy (Incomplete)
OUTPATIENT PHYSICAL THERAPY NEURO TREATMENT   Patient Name: Megan Reid MRN: [id number] DOB:09/28/1965, 56 y.o., female Today's Date: 04/11/2022   PCP: Leeroy Cha  REFERRING PROVIDER: Gertie Gowda, DO           Past Medical History:  Diagnosis Date   Arthralgia 02/14/2016   Asthma    Autoimmune disease ([place]) 02/14/2016   Positive RF 139 CCP Negative  Positive ANA 1:640 Positive Ro Positive [place] Elevated ESR 105 - Sjorgen's Disease   DDD (degenerative disc disease), lumbar 02/14/2016   Fatigue 02/14/2016   Gastritis 02/14/2016   Hot flashes    Migraines 02/14/2016   Osteoarthritis of both hands 02/14/2016   Osteoarthritis of both knees 02/14/2016   Vitamin D deficiency 02/14/2016   Past Surgical History:  Procedure Laterality Date   BREAST BIOPSY Left    ROTATOR CUFF REPAIR Right 06/16/2019   TUBAL LIGATION     Patient Active Problem List   Diagnosis Date Noted   Gastroesophageal reflux disease 03/08/2022   Decreased mobility and endurance 01/25/2022   Difficulty sleeping 01/25/2022   Dairy product intolerance 01/25/2022   Mild cognitive impairment 01/25/2022   Paresthesia of left leg 01/25/2022   Anemia 12/20/2021   Abnormal LFTs 40/98/1191   Toxic metabolic encephalopathy 47/82/9562   Thrombocytopenia ([place]) 12/04/2021   Seasonal and perennial allergic rhinitis 12/02/2020   Chronic coughing 12/02/2020   Sjogren's syndrome ([place]) 12/02/2020   Leukopenia 12/02/2020   Adverse reaction to food, subsequent encounter 06/23/2020   Heartburn 06/23/2020   Bruising 06/23/2020   Abrasion of right ankle 02/13/2017   Rheumatoid factor positive 09/01/2016   Leucopenia 09/01/2016   Autoimmune disease ([place]) 02/14/2016   Fatigue 02/14/2016   DDD (degenerative disc disease), lumbar 02/14/2016   Osteoarthritis of both knees 02/14/2016   Osteoarthritis of both hands 02/14/2016   Asthma 02/14/2016   Migraines 02/14/2016   Gastritis 02/14/2016    Vitamin D deficiency 02/14/2016   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[place]) 02/14/2016   High risk medication use 02/14/2016    ONSET DATE: 12/21/2021   REFERRING DIAG: G92.8 ([id number]) - Toxic metabolic encephalopathy   THERAPY DIAG:  No diagnosis found.  Rationale for Evaluation and Treatment Rehabilitation  SUBJECTIVE:                                                                                                                                                                                              SUBJECTIVE STATEMENT: Doing fine, tired.   Pt accompanied by: self  PERTINENT HISTORY: Brief HPI:   Megan Reid is a 56 y.o. female with history of  DDD lumbar spine, asthma, autoimmune disease, Sjogren's with recent onset of trigeminal neuralgia and was started on Tegretol.  She was also reported to have been taking ibuprofen as well as Tylenol for pain management.  She was found to have metabolic encephalopathy with acute hepatic and renal failure.  Renal failure treated with IV fluids and bicarb .  AKI felt to be due to ATN from acute illness, diarrhea and ibuprofen use.  She developed fever past admission and was treated with IV antibiotics due to concerns of sepsis.  Work-up was negative for DR ESS, tickborne illness, CMV, EBV and acute hepatitis/pancytopenia felt to be secondary to Tegretol toxicity.  She did not have pancytopenia and supportive care was recommended by heme-onc.  Renal status and abnormal LFTs were resolving however patient continued limited by weakness, DOE as well as encephalopathy with delayed processing.  CIR was recommended due to functional decline.     Hospital Course: Megan Reid was admitted to rehab 12/12/2021 for inpatient therapies to consist of PT, ST and OT at least three hours five days a week. Past admission physiatrist, therapy team and rehab RN have worked together to provide customized collaborative inpatient rehab.  Her  blood pressures were monitored on TID basis and has been stable.  Follow-up check of electrolytes revealed renal status as well as abnormal LFTs to be gradually improving.  She was found to have drop in magnesium to 1.3 requiring IV supplementation as well as p.o. supplement.  Repeat check showed improvement with magnesium of 2.0.  Recommend repeat check in 1 to 2 weeks to monitor for stability as well as need to continue oral supplementation.   Follow-up CBC showed improvement in white count with stable H&H and that thrombocytopenia had resolved.  Her alertness and mentation has gradually improved with improvement in activity tolerance and respiratory status.  Hydroxyzine 10 mg 3 times daily as needed was added to help manage anxiety.  Sleep-wake disruption is improved with addition of melatonin.  Constipation has resolved with augmentation of bowel regimen.  She has made steady gains during her rehab stay and is modified independent at discharge.  Supervision is recommended with cognitive task and for safety.  She will continue to receive follow-up outpatient PT, OT and ST at [facility] outpatient rehab after discharge.     Rehab course: During patient's stay in rehab weekly team conferences were held to monitor patient's progress, set goals and discuss barriers to discharge. At admission, patient required min assist with mobility and with ADL tasks.  She exhibited mild cognitive deficits affecting recall, memory and executive function with SLUMS score 22/30. She  has had improvement in activity tolerance, balance, postural control as well as ability to compensate for deficits. She is able to complete ADL tasks at modified independent level. She is independent for transfers and is able to ambulate 150' without AD. She is able to climb 12 stairs independently. She requires supervision with functional and complex cognitive tasks. Her SLUMS score has improved to 26/30. Family education has been completed.     Disposition: Home  PAIN:  Are you having pain? No  PRECAUTIONS: Fall  WEIGHT BEARING RESTRICTIONS No  FALLS: Has patient fallen in last 6 months? Yes. Number of falls 1  LIVING ENVIRONMENT: Lives with: lives with their spouse Lives in: House/apartment Stairs: 4 STE B rails, home is handicap accessible except for bathtub  Has following equipment at home: None  PLOF: Independent and Independent with basic ADLs  PATIENT GOALS  build strength and balance, endurance   OBJECTIVE:   DIAGNOSTIC FINDINGS:   COGNITION: Overall cognitive status: Impaired- memory issues after acute medical problem    SENSATION: Not tested   LOWER EXTREMITY ROM:     Active  Right Eval Left Eval  Hip flexion    Hip extension    Hip abduction    Hip adduction    Hip internal rotation    Hip external rotation    Knee flexion    Knee extension    Ankle dorsiflexion    Ankle plantarflexion    Ankle inversion    Ankle eversion     (Blank rows = not tested)  LOWER EXTREMITY MMT:    MMT Right Eval Left Eval Right 04/05/22 Left 04/05/22  Hip flexion _0 Hip extension _1 Hip abduction 3 4- 4 4  Hip adduction      Hip internal rotation      Hip external rotation      Knee flexion _2 4+  Knee extension _3 Ankle dorsiflexion _4 Ankle plantarflexion      Ankle inversion      Ankle eversion      (Blank rows = not tested)  BED MOBILITY:  Sit to supine Complete Independence Supine to sit Complete Independence Rolling to Right Complete Independence Rolling to Left Complete Independence  TRANSFERS: Assistive device utilized: None  Sit to stand: Complete Independence Stand to sit: Complete Independence Chair to chair: Complete Independence Floor:    GAIT: Gait pattern: WFL and trendelenburg Distance walked: 667f  Assistive device utilized: None Level of assistance: Complete Independence Comments: WNL   FUNCTIONAL TESTs:  Berg Balance Scale:  42/56 2MWT 6727f  TODAY'S TREATMENT:  04/11/22 Elliptical Nustep Resisted side steps  Leg press  Side steps on double airex  04/05/22 Recheck goals and MMT  Bike  L3 with power bursts 8m8m UBE L3 with power bursts 4mi21mStep up on 6" + airex 2 way hip 5# 2x10  03/29/22 Elliptical L2 x4 mins  UBE L3 x4 mins Calf stretch 30s  STS on airex w/shoulder flexion 3# 2x10  Alternating leg press jumps 20# 2x10 switches  SL cone taps  Stretching HS, SKTC, glutes 30s each  03/28/22 Treadmill 3% 2mph48mmins  Step ups holding 5# dumbbells 6" x10 each side, and then with high knee x10  Leg press 50# 3x10  Side steps into box and onto airex  Obstacle course walking on beam narrow, step up 10", walking on beam side steps SLS 10s at best  03/23/22 NuStep L5x6mins61meg ext 10# 2x10 HS curls 25# 2x10 Step ups on double airex x10 each side Tandem stance on foam hitting ball  Standing on BOSU catching ball  STS with blue ball push out 2x10 on airex   03/16/22 Bike with power bursts every 2mins-16mx6mins  53m step ups x10 each side  4 way square x5 clockwise and then counterclockwise---then with direction changes  Side steps on BOSU Mini squats on BOSU 2x10 Side steps on beam with red ball catch Tandem walking on beam   03/14/22 NuStep L5x6mins Re47mted gait with step up 6" 30# x5 each side Resisted gait with side step up 30# x5 each side  Leg press 40# 2x10 SL stance 8s at best on L, 10s on R  SL catch-- unable to hold for more than 3-4s STS with OHP  2x10   03/07/22 Elliptical L2 x 56mns  NuStep L5 x567ms  Stretching HS, glutes, IT band 30s each  Leg ext 15# 2x10 HS curls 35# 2x10 Manual traction to BLE, pt states it relieves her N/T   03/02/22 Bike L4 x4m45m  UBE L4 x4 mins  Stretching HS, piriformis, glutes, SK2C 30s each  Manual traction of BLE  Standing on BOSU blue side catch 5 catches at best  Leg press 40# 2x10  Alternating arm and leg lifts  [id number]/21/3/08cert Nustep [id number] on airex with red ball toss 2x10  Leg ext 15# 2x10 HS curls 35# 2x10 Lumbar traction 85# 48m26m  02/21/22 Walk outside Bike L4 x4 mins with power bursts x2  Hip abd/ext 5# 2x10 Standing on airex feet together catch red ball, tried SL but too difficult pt states it is bc she is tired Rows and Lats 20# 2x10   PATIENT EDUCATION: Education details: exam findings, POC, HEP  Person educated: Patient Education method: Explanation, Demonstration, and Handouts Education comprehension: verbalized understanding and returned demonstration   HOME EXERCISE PROGRAM: [id number]    GOALS: Goals reviewed with patient? Yes  SHORT TERM GOALS: Target date: 01/30/2022  Will be compliant with appropriate progressive HEP  Baseline: Goal status: MET  2.  Will be able to name at least 3 ways to reduce fall risk at home and in community  Baseline:  Goal status: MET  3.  Will tolerate standing tasks for at least 90 minutes without increase in fatigue  Baseline:  Goal status: IN PROGRESS  4.  Will be able to walk for at least 15 minutes without rest to show improved functional activity tolerance  Baseline: 10 mins- 11/2, 15 mins-12/20 Goal status: MET    LONG TERM GOALS: Target date: 04/20/21  MMT to improve by at least 1 grade in all weak groups  Baseline:  Goal status: MET  2.  Will score at least 50 on the Berg to show improved functional balance  Baseline: 53- 11/2 Goal status: MET  3.  Will tolerate at least 1/2 day at work without fatigue to show improved functional activity tolerance  Goal status: MET  4.  Will be independent with appropriate gym based exercise program to maintain functional gains and continue progress on an independent basis  Baseline:  Goal status: IN PROGRESS   ASSESSMENT:  CLINICAL IMPRESSION: Progress note complete, she has met almost all of her goals. She still has some weakness in her hips most notably in  her hip extensors. Continued to work on strengthening and endurance. We talked about slowly transition into an independent program as we get close to wrapping up PT.     OBJECTIVE IMPAIRMENTS cardiopulmonary status limiting activity, decreased activity tolerance, decreased balance, decreased cognition, decreased mobility, difficulty walking, decreased strength, and improper body mechanics.   ACTIVITY LIMITATIONS standing, squatting, stairs, transfers, bed mobility, locomotion level, and caring for others  PARTICIPATION LIMITATIONS: community activity, occupation, and yard work  PERSONAL FACTORS Age, Fitness, Past/current experiences, and Time since onset of injury/illness/exacerbation are also affecting patient's functional outcome.   REHAB POTENTIAL: Good  CLINICAL DECISION MAKING: Evolving/moderate complexity  EVALUATION COMPLEXITY: Moderate  PLAN: PT FREQUENCY: 2x/week  PT DURATION: 8 weeks  PLANNED INTERVENTIONS: Therapeutic exercises, Therapeutic activity, Neuromuscular re-education, Balance training, Gait training, Patient/Family education, Self Care, Joint mobilization, Stair training, Dry Needling, Cognitive remediation, Cryotherapy, Moist heat, Taping, Traction, Ultrasound, Ionotophoresis 4mg/29mDexamethasone, Manual therapy, and Re-evaluation  PLAN FOR NEXT SESSION:  Assess tolerance to HEP, PN-re-assessment  Andris Baumann, DPT  04/11/22 9:10 AM  04/11/2022, 9:10 AM

## 2022-04-13 ENCOUNTER — Ambulatory Visit: Payer: BC Managed Care – PPO

## 2022-04-13 DIAGNOSIS — R278 Other lack of coordination: Secondary | ICD-10-CM | POA: Diagnosis not present

## 2022-04-13 DIAGNOSIS — M6281 Muscle weakness (generalized): Secondary | ICD-10-CM

## 2022-04-13 DIAGNOSIS — G8929 Other chronic pain: Secondary | ICD-10-CM

## 2022-04-13 DIAGNOSIS — R2681 Unsteadiness on feet: Secondary | ICD-10-CM

## 2022-04-13 NOTE — Therapy (Signed)
OUTPATIENT PHYSICAL THERAPY NEURO TREATMENT   Patient Name: Megan Reid MRN: [id number] DOB:[birth year], 56 y.o., female Today's Date: 04/13/2022   PCP: [place], Kimberly PROVIDER: Gertie Gowda, DO    PT End of Session - 04/13/22 1114     Visit Number 21    Date for PT Re-Evaluation 04/20/22    PT Start Time 1114    PT Stop Time 1145    PT Time Calculation (min) 31 min    Activity Tolerance Patient tolerated treatment well;Patient limited by fatigue    Behavior During Therapy [facility] for tasks assessed/performed                   Past Medical History:  Diagnosis Date   Arthralgia 02/14/2016   Asthma    Autoimmune disease ([place]) 02/14/2016   Positive RF 139 CCP Negative  Positive ANA 1:640 Positive Ro Positive [place] Elevated ESR 105 - Sjorgen's Disease   DDD (degenerative disc disease), lumbar 02/14/2016   Fatigue 02/14/2016   Gastritis 02/14/2016   Hot flashes    Migraines 02/14/2016   Osteoarthritis of both hands 02/14/2016   Osteoarthritis of both knees 02/14/2016   Vitamin D deficiency 02/14/2016   Past Surgical History:  Procedure Laterality Date   BREAST BIOPSY Left    ROTATOR CUFF REPAIR Right 06/16/2019   TUBAL LIGATION     Patient Active Problem List   Diagnosis Date Noted   Gastroesophageal reflux disease 03/08/2022   Decreased mobility and endurance 01/25/2022   Difficulty sleeping 01/25/2022   Dairy product intolerance 01/25/2022   Mild cognitive impairment 01/25/2022   Paresthesia of left leg 01/25/2022   Anemia 12/20/2021   Abnormal LFTs 01/21/1218   Toxic metabolic encephalopathy 75/88/3254   Thrombocytopenia ([place]) 12/04/2021   Seasonal and perennial allergic rhinitis 12/02/2020   Chronic coughing 12/02/2020   Sjogren's syndrome ([place]) 12/02/2020   Leukopenia 12/02/2020   Adverse reaction to food, subsequent encounter 06/23/2020   Heartburn 06/23/2020   Bruising 06/23/2020   Abrasion of right ankle 02/13/2017    Rheumatoid factor positive 09/01/2016   Leucopenia 09/01/2016   Autoimmune disease ([place]) 02/14/2016   Fatigue 02/14/2016   DDD (degenerative disc disease), lumbar 02/14/2016   Osteoarthritis of both knees 02/14/2016   Osteoarthritis of both hands 02/14/2016   Asthma 02/14/2016   Migraines 02/14/2016   Gastritis 02/14/2016   Vitamin D deficiency 02/14/2016   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[place]) 02/14/2016   High risk medication use 02/14/2016    ONSET DATE: 12/21/2021   REFERRING DIAG: G92.8 (DIY-64-BR) - Toxic metabolic encephalopathy   THERAPY DIAG:  Other lack of coordination  Unsteadiness on feet  Muscle weakness (generalized)  Muscle weakness  Chronic bilateral low back pain, unspecified whether sciatica present  Rationale for Evaluation and Treatment Rehabilitation  SUBJECTIVE:  SUBJECTIVE STATEMENT: I am alright, running late. Nothing new.  Pt accompanied by: self  PERTINENT HISTORY: Brief HPI:   Megan Reid is a 56 y.o. female with history of DDD lumbar spine, asthma, autoimmune disease, Sjogren's with recent onset of trigeminal neuralgia and was started on Tegretol.  She was also reported to have been taking ibuprofen as well as Tylenol for pain management.  She was found to have metabolic encephalopathy with acute hepatic and renal failure.  Renal failure treated with IV fluids and bicarb .  AKI felt to be due to ATN from acute illness, diarrhea and ibuprofen use.  She developed fever past admission and was treated with IV antibiotics due to concerns of sepsis.  Work-up was negative for DR ESS, tickborne illness, CMV, EBV and acute hepatitis/pancytopenia felt to be secondary to Tegretol toxicity.  She did not have pancytopenia and supportive care was  recommended by heme-onc.  Renal status and abnormal LFTs were resolving however patient continued limited by weakness, DOE as well as encephalopathy with delayed processing.  CIR was recommended due to functional decline.     Hospital Course: Megan Reid was admitted to rehab 12/12/2021 for inpatient therapies to consist of PT, ST and OT at least three hours five days a week. Past admission physiatrist, therapy team and rehab RN have worked together to provide customized collaborative inpatient rehab.  Her blood pressures were monitored on TID basis and has been stable.  Follow-up check of electrolytes revealed renal status as well as abnormal LFTs to be gradually improving.  She was found to have drop in magnesium to 1.3 requiring IV supplementation as well as p.o. supplement.  Repeat check showed improvement with magnesium of 2.0.  Recommend repeat check in 1 to 2 weeks to monitor for stability as well as need to continue oral supplementation.   Follow-up CBC showed improvement in white count with stable H&H and that thrombocytopenia had resolved.  Her alertness and mentation has gradually improved with improvement in activity tolerance and respiratory status.  Hydroxyzine 10 mg 3 times daily as needed was added to help manage anxiety.  Sleep-wake disruption is improved with addition of melatonin.  Constipation has resolved with augmentation of bowel regimen.  She has made steady gains during her rehab stay and is modified independent at discharge.  Supervision is recommended with cognitive task and for safety.  She will continue to receive follow-up outpatient PT, OT and ST at [facility] outpatient rehab after discharge.     Rehab course: During patient's stay in rehab weekly team conferences were held to monitor patient's progress, set goals and discuss barriers to discharge. At admission, patient required min assist with mobility and with ADL tasks.  She exhibited mild cognitive deficits affecting  recall, memory and executive function with SLUMS score 22/30. She  has had improvement in activity tolerance, balance, postural control as well as ability to compensate for deficits. She is able to complete ADL tasks at modified independent level. She is independent for transfers and is able to ambulate 150' without AD. She is able to climb 12 stairs independently. She requires supervision with functional and complex cognitive tasks. Her SLUMS score has improved to 26/30. Family education has been completed.    Disposition: Home  PAIN:  Are you having pain? No  PRECAUTIONS: Fall  WEIGHT BEARING RESTRICTIONS No  FALLS: Has patient fallen in last 6 months? Yes. Number of falls 1  LIVING ENVIRONMENT: Lives with: lives with their spouse Lives  in: House/apartment Stairs: 4 STE B rails, home is handicap accessible except for bathtub  Has following equipment at home: None  PLOF: Independent and Independent with basic ADLs  PATIENT GOALS build strength and balance, endurance   OBJECTIVE:   DIAGNOSTIC FINDINGS:   COGNITION: Overall cognitive status: Impaired- memory issues after acute medical problem    SENSATION: Not tested   LOWER EXTREMITY ROM:     Active  Right Eval Left Eval  Hip flexion    Hip extension    Hip abduction    Hip adduction    Hip internal rotation    Hip external rotation    Knee flexion    Knee extension    Ankle dorsiflexion    Ankle plantarflexion    Ankle inversion    Ankle eversion     (Blank rows = not tested)  LOWER EXTREMITY MMT:    MMT Right Eval Left Eval Right 04/05/22 Left 04/05/22  Hip flexion _0 Hip extension _1 Hip abduction 3 4- 4 4  Hip adduction      Hip internal rotation      Hip external rotation      Knee flexion _2 4+  Knee extension _3 Ankle dorsiflexion _4 Ankle plantarflexion      Ankle inversion      Ankle eversion      (Blank rows = not tested)  BED MOBILITY:  Sit to supine  Complete Independence Supine to sit Complete Independence Rolling to Right Complete Independence Rolling to Left Complete Independence  TRANSFERS: Assistive device utilized: None  Sit to stand: Complete Independence Stand to sit: Complete Independence Chair to chair: Complete Independence Floor:    GAIT: Gait pattern: WFL and trendelenburg Distance walked: 658f  Assistive device utilized: None Level of assistance: Complete Independence Comments: WNL   FUNCTIONAL TESTs:  Berg Balance Scale: 42/56 2MWT 6729f  TODAY'S TREATMENT:  04/13/22 Elliptical L2 x4m39m Lateral step up and diagonal step down Standing on BOSU throwing and catching 2 tennis balls Leg press 50# 3x10 Side steps on double airex Jumps on tramp in the middle and out and then 2 jumps in middle and out and then scissoring forwards and backs   04/05/22 Recheck goals and MMT  Bike  L3 with power bursts [id number] L3 with power bursts 4min54mtep up on 6" + airex 2 way hip 5# 2x10  03/29/22 Elliptical L2 x4 mins  UBE L3 x4 mins Calf stretch 30s  STS on airex w/shoulder flexion 3# 2x10  Alternating leg press jumps 20# 2x10 switches  SL cone taps  Stretching HS, SKTC, glutes 30s each  03/28/22 Treadmill 3% 2mph 22mins  Step ups holding 5# dumbbells 6" x10 each side, and then with high knee x10  Leg press 50# 3x10  Side steps into box and onto airex  Obstacle course walking on beam narrow, step up 10", walking on beam side steps SLS 10s at best  03/23/22 NuStep L5x6mins 23mg ext 10# 2x10 HS curls 25# 2x10 Step ups on double airex x10 each side Tandem stance on foam hitting ball  Standing on BOSU catching ball  STS with blue ball push out 2x10 on airex   03/16/22 Bike with power bursts every 2mins- 72m6mins  179mstep ups x10 each side  4 way square x5 clockwise and then counterclockwise---then with direction changes  Side steps on BOSU Mini  squats on BOSU 2x10 Side steps on beam with red ball  catch Tandem walking on beam   03/14/22 NuStep L5x71mns Resisted gait with step up 6" 30# x5 each side Resisted gait with side step up 30# x5 each side  Leg press 40# 2x10 SL stance 8s at best on L, 10s on R  SL catch-- unable to hold for more than 3-4s STS with OHP 2x10   03/07/22 Elliptical L2 x 47ms  NuStep L5 x5m38m  Stretching HS, glutes, IT band 30s each  Leg ext 15# 2x10 HS curls 35# 2x10 Manual traction to BLE, pt states it relieves her N/T   03/02/22 Bike L4 x4mi24m UBE L4 x4 mins  Stretching HS, piriformis, glutes, SK2C 30s each  Manual traction of BLE  Standing on BOSU blue side catch 5 catches at best  Leg press 40# 2x10  Alternating arm and leg lifts x10 A45/936/4/68ert Nustep [id number] on airex with red ball toss 2x10  Leg ext 15# 2x10 HS curls 35# 2x10 Lumbar traction 85# 10mi4m 02/21/22 Walk outside Bike L4 x4 mins with power bursts x2  Hip abd/ext 5# 2x10 Standing on airex feet together catch red ball, tried SL but too difficult pt states it is bc she is tired Rows and Lats 20# 2x10   PATIENT EDUCATION: Education details: exam findings, POC, HEP  Person educated: Patient Education method: Explanation, Demonstration, and Handouts Education comprehension: verbalized understanding and returned demonstration   HOME EXERCISE PROGRAM: [id number]    GOALS: Goals reviewed with patient? Yes  SHORT TERM GOALS: Target date: 01/30/2022  Will be compliant with appropriate progressive HEP  Baseline: Goal status: MET  2.  Will be able to name at least 3 ways to reduce fall risk at home and in community  Baseline:  Goal status: MET  3.  Will tolerate standing tasks for at least 90 minutes without increase in fatigue  Baseline:  Goal status: IN PROGRESS  4.  Will be able to walk for at least 15 minutes without rest to show improved functional activity tolerance  Baseline: 10 mins- 11/2, 15 mins-12/20 Goal status: MET    LONG TERM  GOALS: Target date: 04/20/21  MMT to improve by at least 1 grade in all weak groups  Baseline:  Goal status: MET  2.  Will score at least 50 on the Berg to show improved functional balance  Baseline: 53- 11/2 Goal status: MET  3.  Will tolerate at least 1/2 day at work without fatigue to show improved functional activity tolerance  Goal status: MET  4.  Will be independent with appropriate gym based exercise program to maintain functional gains and continue progress on an independent basis  Baseline:  Goal status: IN PROGRESS   ASSESSMENT:  CLINICAL IMPRESSION: Patient arrives ~14 mins late to appt. We continued with some endurance and balance/coordination exercises. Does well with high level balance on BOSU and multitasking today.    OBJECTIVE IMPAIRMENTS cardiopulmonary status limiting activity, decreased activity tolerance, decreased balance, decreased cognition, decreased mobility, difficulty walking, decreased strength, and improper body mechanics.   ACTIVITY LIMITATIONS standing, squatting, stairs, transfers, bed mobility, locomotion level, and caring for others  PARTICIPATION LIMITATIONS: community activity, occupation, and yard work  PERSONAL FACTORS Age, Fitness, Past/current experiences, and Time since onset of injury/illness/exacerbation are also affecting patient's functional outcome.   REHAB POTENTIAL: Good  CLINICAL DECISION MAKING: Evolving/moderate complexity  EVALUATION COMPLEXITY: Moderate  PLAN: PT FREQUENCY: 2x/week  PT DURATION:  8 weeks  PLANNED INTERVENTIONS: Therapeutic exercises, Therapeutic activity, Neuromuscular re-education, Balance training, Gait training, Patient/Family education, Self Care, Joint mobilization, Stair training, Dry Needling, Cognitive remediation, Cryotherapy, Moist heat, Taping, Traction, Ultrasound, Ionotophoresis 20m/ml Dexamethasone, Manual therapy, and Re-evaluation  PLAN FOR NEXT SESSION: Assess tolerance to HEP,  PN-re-assessment  MAndris Baumann DPT  04/13/22 11:53 AM  04/13/2022, 11:53 AM

## 2022-04-18 NOTE — Therapy (Signed)
OUTPATIENT PHYSICAL THERAPY NEURO TREATMENT   Patient Name: Megan Reid MRN: [id number] DOB:1965/06/20, 57 y.o., female Today's Date: 04/20/2022   PCP: Leeroy Cha  REFERRING PROVIDER: Gertie Gowda, DO    PT End of Session - 04/20/22 1631     Visit Number 22    Date for PT Re-Evaluation 04/20/22    PT Start Time 1630    PT Stop Time 1715    PT Time Calculation (min) 45 min    Activity Tolerance Patient tolerated treatment well;Patient limited by fatigue    Behavior During Therapy [facility] for tasks assessed/performed                    Past Medical History:  Diagnosis Date   Arthralgia 02/14/2016   Asthma    Autoimmune disease ([place]) 02/14/2016   Positive RF 139 CCP Negative  Positive ANA 1:640 Positive Ro Positive [place] Elevated ESR 105 - Sjorgen's Disease   DDD (degenerative disc disease), lumbar 02/14/2016   Fatigue 02/14/2016   Gastritis 02/14/2016   Hot flashes    Migraines 02/14/2016   Osteoarthritis of both hands 02/14/2016   Osteoarthritis of both knees 02/14/2016   Vitamin D deficiency 02/14/2016   Past Surgical History:  Procedure Laterality Date   BREAST BIOPSY Left    ROTATOR CUFF REPAIR Right 06/16/2019   TUBAL LIGATION     Patient Active Problem List   Diagnosis Date Noted   Gastroesophageal reflux disease 03/08/2022   Decreased mobility and endurance 01/25/2022   Difficulty sleeping 01/25/2022   Dairy product intolerance 01/25/2022   Mild cognitive impairment 01/25/2022   Paresthesia of left leg 01/25/2022   Anemia 12/20/2021   Abnormal LFTs 06/09/3610   Toxic metabolic encephalopathy 24/49/7530   Thrombocytopenia ([place]) 12/04/2021   Seasonal and perennial allergic rhinitis 12/02/2020   Chronic coughing 12/02/2020   Sjogren's syndrome ([place]) 12/02/2020   Leukopenia 12/02/2020   Adverse reaction to food, subsequent encounter 06/23/2020   Heartburn 06/23/2020   Bruising 06/23/2020   Abrasion of right ankle 02/13/2017    Rheumatoid factor positive 09/01/2016   Leucopenia 09/01/2016   Autoimmune disease ([place]) 02/14/2016   Fatigue 02/14/2016   DDD (degenerative disc disease), lumbar 02/14/2016   Osteoarthritis of both knees 02/14/2016   Osteoarthritis of both hands 02/14/2016   Asthma 02/14/2016   Migraines 02/14/2016   Gastritis 02/14/2016   Vitamin D deficiency 02/14/2016   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[place]) 02/14/2016   High risk medication use 02/14/2016    ONSET DATE: 12/21/2021   REFERRING DIAG: G92.8 (YFR-10-YT) - Toxic metabolic encephalopathy   THERAPY DIAG:  Other lack of coordination  Unsteadiness on feet  Muscle weakness (generalized)  Muscle weakness  Rationale for Evaluation and Treatment Rehabilitation  SUBJECTIVE:  SUBJECTIVE STATEMENT: I am alright, running late. Nothing new.  Pt accompanied by: self  PERTINENT HISTORY: Brief HPI:   Megan Reid is a 57 y.o. female with history of DDD lumbar spine, asthma, autoimmune disease, Sjogren's with recent onset of trigeminal neuralgia and was started on Tegretol.  She was also reported to have been taking ibuprofen as well as Tylenol for pain management.  She was found to have metabolic encephalopathy with acute hepatic and renal failure.  Renal failure treated with IV fluids and bicarb .  AKI felt to be due to ATN from acute illness, diarrhea and ibuprofen use.  She developed fever past admission and was treated with IV antibiotics due to concerns of sepsis.  Work-up was negative for DR ESS, tickborne illness, CMV, EBV and acute hepatitis/pancytopenia felt to be secondary to Tegretol toxicity.  She did not have pancytopenia and supportive care was recommended by heme-onc.  Renal status and abnormal LFTs were resolving however  patient continued limited by weakness, DOE as well as encephalopathy with delayed processing.  CIR was recommended due to functional decline.     Hospital Course: Megan Reid was admitted to rehab 12/12/2021 for inpatient therapies to consist of PT, ST and OT at least three hours five days a week. Past admission physiatrist, therapy team and rehab RN have worked together to provide customized collaborative inpatient rehab.  Her blood pressures were monitored on TID basis and has been stable.  Follow-up check of electrolytes revealed renal status as well as abnormal LFTs to be gradually improving.  She was found to have drop in magnesium to 1.3 requiring IV supplementation as well as p.o. supplement.  Repeat check showed improvement with magnesium of 2.0.  Recommend repeat check in 1 to 2 weeks to monitor for stability as well as need to continue oral supplementation.   Follow-up CBC showed improvement in white count with stable H&H and that thrombocytopenia had resolved.  Her alertness and mentation has gradually improved with improvement in activity tolerance and respiratory status.  Hydroxyzine 10 mg 3 times daily as needed was added to help manage anxiety.  Sleep-wake disruption is improved with addition of melatonin.  Constipation has resolved with augmentation of bowel regimen.  She has made steady gains during her rehab stay and is modified independent at discharge.  Supervision is recommended with cognitive task and for safety.  She will continue to receive follow-up outpatient PT, OT and ST at [facility] outpatient rehab after discharge.     Rehab course: During patient's stay in rehab weekly team conferences were held to monitor patient's progress, set goals and discuss barriers to discharge. At admission, patient required min assist with mobility and with ADL tasks.  She exhibited mild cognitive deficits affecting recall, memory and executive function with SLUMS score 22/30. She  has had  improvement in activity tolerance, balance, postural control as well as ability to compensate for deficits. She is able to complete ADL tasks at modified independent level. She is independent for transfers and is able to ambulate 150' without AD. She is able to climb 12 stairs independently. She requires supervision with functional and complex cognitive tasks. Her SLUMS score has improved to 26/30. Family education has been completed.    Disposition: Home  PAIN:  Are you having pain? No  PRECAUTIONS: Fall  WEIGHT BEARING RESTRICTIONS No  FALLS: Has patient fallen in last 6 months? Yes. Number of falls 1  LIVING ENVIRONMENT: Lives with: lives with their spouse Lives  in: House/apartment Stairs: 4 STE B rails, home is handicap accessible except for bathtub  Has following equipment at home: None  PLOF: Independent and Independent with basic ADLs  PATIENT GOALS build strength and balance, endurance   OBJECTIVE:   DIAGNOSTIC FINDINGS:   COGNITION: Overall cognitive status: Impaired- memory issues after acute medical problem    SENSATION: Not tested   LOWER EXTREMITY ROM:     Active  Right Eval Left Eval  Hip flexion    Hip extension    Hip abduction    Hip adduction    Hip internal rotation    Hip external rotation    Knee flexion    Knee extension    Ankle dorsiflexion    Ankle plantarflexion    Ankle inversion    Ankle eversion     (Blank rows = not tested)  LOWER EXTREMITY MMT:    MMT Right Eval Left Eval Right 04/05/22 Left 04/05/22  Hip flexion _0 Hip extension _1 Hip abduction 3 4- 4 4  Hip adduction      Hip internal rotation      Hip external rotation      Knee flexion _2 4+  Knee extension _3 Ankle dorsiflexion _4 Ankle plantarflexion      Ankle inversion      Ankle eversion      (Blank rows = not tested)  BED MOBILITY:  Sit to supine Complete Independence Supine to sit Complete Independence Rolling to Right  Complete Independence Rolling to Left Complete Independence  TRANSFERS: Assistive device utilized: None  Sit to stand: Complete Independence Stand to sit: Complete Independence Chair to chair: Complete Independence Floor:    GAIT: Gait pattern: WFL and trendelenburg Distance walked: 665f  Assistive device utilized: None Level of assistance: Complete Independence Comments: WNL   FUNCTIONAL TESTs:  Berg Balance Scale: 42/56 2MWT 6724f  TODAY'S TREATMENT:  04/20/22 NuStep L5 x6m41m Treadmill pushes 30s x3 Resisted gait 30# 4way x5 SL on BOSU 5s  STS on airex yellow ball press out and OHP 2x10 Leg press 40# 2x10  04/13/22 Elliptical L2 x4mi67mLateral step up and diagonal step down Standing on BOSU throwing and catching 2 tennis balls Leg press 50# 3x10 Side steps on double airex Jumps on tramp in the middle and out and then 2 jumps in middle and out and then scissoring forwards and backs   04/05/22 Recheck goals and MMT  Bike  L3 with power bursts 5min109mBE L3 with power bursts 4mins102mep up on 6" + airex 2 way hip 5# 2x10  03/29/22 Elliptical L2 x4 mins  UBE L3 x4 mins Calf stretch 30s  STS on airex w/shoulder flexion 3# 2x10  Alternating leg press jumps 20# 2x10 switches  SL cone taps  Stretching HS, SKTC, glutes 30s each  03/28/22 Treadmill 3% 2mph 592mns  Step ups holding 5# dumbbells 6" x10 each side, and then with high knee x10  Leg press 50# 3x10  Side steps into box and onto airex  Obstacle course walking on beam narrow, step up 10", walking on beam side steps SLS 10s at best  03/23/22 NuStep L5x6mins  32m ext 10# 2x10 HS curls 25# 2x10 Step ups on double airex x10 each side Tandem stance on foam hitting ball  Standing on BOSU catching ball  STS with blue ball push out 2x10 on airex  03/16/22 Bike with power bursts every 31mns- L4x644ms  10" step ups x10 each side  4 way square x5 clockwise and then counterclockwise---then with direction  changes  Side steps on BOSU Mini squats on BOSU 2x10 Side steps on beam with red ball catch Tandem walking on beam   03/14/22 NuStep L5x6m43m Resisted gait with step up 6" 30# x5 each side Resisted gait with side step up 30# x5 each side  Leg press 40# 2x10 SL stance 8s at best on L, 10s on R  SL catch-- unable to hold for more than 3-4s STS with OHP 2x10   03/07/22 Elliptical L2 x 4mi75m NuStep L5 x5min99mStretching HS, glutes, IT band 30s each  Leg ext 15# 2x10 HS curls 35# 2x10 Manual traction to BLE, pt states it relieves her N/T   03/02/22 Bike L4 x4mins44mBE L4 x4 mins  Stretching HS, piriformis, glutes, SK2C 30s each  Manual traction of BLE  Standing on BOSU blue side catch 5 catches at best  Leg press 40# 2x10  Alternating arm and leg lifts x10  1K09/238/1/82t Nustep [id number] airex with red ball toss 2x10  Leg ext 15# 2x10 HS curls 35# 2x10 Lumbar traction 85# 10mins29m1/7/23 Walk outside Bike L4 x4 mins with power bursts x2  Hip abd/ext 5# 2x10 Standing on airex feet together catch red ball, tried SL but too difficult pt states it is bc she is tired Rows and Lats 20# 2x10   PATIENT EDUCATION: Education details: exam findings, POC, HEP  Person educated: Patient Education method: Explanation, Demonstration, and Handouts Education comprehension: verbalized understanding and returned demonstration   HOME EXERCISE PROGRAM: [id number]    GOALS: Goals reviewed with patient? Yes  SHORT TERM GOALS: Target date: 01/30/2022  Will be compliant with appropriate progressive HEP  Baseline: Goal status: MET  2.  Will be able to name at least 3 ways to reduce fall risk at home and in community  Baseline:  Goal status: MET  3.  Will tolerate standing tasks for at least 60 minutes without increase in fatigue  Baseline:  Goal status: MET  4.  Will be able to walk for at least 15 minutes without rest to show improved functional activity  tolerance  Baseline: 10 mins- 11/2, 15 mins-12/20 Goal status: MET    LONG TERM GOALS: Target date: 04/20/21  MMT to improve by at least 1 grade in all weak groups  Baseline:  Goal status: MET  2.  Will score at least 50 on the Berg to show improved functional balance  Baseline: 53- 11/2 Goal status: MET  3.  Will tolerate at least 1/2 day at work without fatigue to show improved functional activity tolerance  Goal status: MET  4.  Will be independent with appropriate gym based exercise program to maintain functional gains and continue progress on an independent basis  Baseline:  Goal status: MET   ASSESSMENT:  CLINICAL IMPRESSION:  Patient continues to have SOB with endurance activities which could be from severe persistent asthma. She will continue to need breaks at work due to lingering fatigue. Will continue exercising independently on her own to work on her health, wellness, and endurance.  OBJECTIVE IMPAIRMENTS cardiopulmonary status limiting activity, decreased activity tolerance, decreased balance, decreased cognition, decreased mobility, difficulty walking, decreased strength, and improper body mechanics.   ACTIVITY LIMITATIONS standing, squatting, stairs, transfers, bed mobility, locomotion level, and caring for others  PARTICIPATION LIMITATIONS: community activity,  occupation, and yard work  PERSONAL FACTORS Age, Fitness, Past/current experiences, and Time since onset of injury/illness/exacerbation are also affecting patient's functional outcome.   REHAB POTENTIAL: Good  CLINICAL DECISION MAKING: Evolving/moderate complexity  EVALUATION COMPLEXITY: Moderate  PLAN: PT FREQUENCY: 2x/week  PT DURATION: 8 weeks  PLANNED INTERVENTIONS: Therapeutic exercises, Therapeutic activity, Neuromuscular re-education, Balance training, Gait training, Patient/Family education, Self Care, Joint mobilization, Stair training, Dry Needling, Cognitive remediation, Cryotherapy,  Moist heat, Taping, Traction, Ultrasound, Ionotophoresis 13m/ml Dexamethasone, Manual therapy, and Re-evaluation  PLAN FOR NEXT SESSION: Assess tolerance to HEP, PN-re-assessment  PHYSICAL THERAPY DISCHARGE SUMMARY  Visits from Start of Care: 22  Patient agrees to discharge. Patient goals were met. Patient is being discharged due to meeting the stated rehab goals.    MAndris Baumann DPT  04/20/22 5:13 PM  04/20/2022, 5:13 PM

## 2022-04-20 ENCOUNTER — Ambulatory Visit: Payer: BC Managed Care – PPO | Attending: Physical Medicine and Rehabilitation

## 2022-04-20 DIAGNOSIS — R2681 Unsteadiness on feet: Secondary | ICD-10-CM | POA: Diagnosis present

## 2022-04-20 DIAGNOSIS — M6281 Muscle weakness (generalized): Secondary | ICD-10-CM | POA: Insufficient documentation

## 2022-04-20 DIAGNOSIS — R278 Other lack of coordination: Secondary | ICD-10-CM | POA: Insufficient documentation

## 2022-04-27 NOTE — Progress Notes (Signed)
Follow-up Visit   Date: 04/27/22   Megan Reid MRN: [id number] DOB: [birth year]   Interim History: Megan Reid is a 58 y.o. right-handed female with asthma, Sjogren's disease, cytopenia, and lumbar canal stenosis  returning to the clinic with complaints of right facial pain.  The patient was accompanied to the clinic by self.  IMPRESSION/PLAN: Lumbar canal stenosis at L5-S1 causing bilateral feet paresthesias  - NCS/EMG of the legs shows chronic L5 radiculopathy, no evidence of neuropathy   - Start physical therapy for low back strengthening  - Start gabapentin 100mg  at bedtime  2.  Right trigeminal nerve pain secondary to localized nerve irritation due to proximity of the parotid gland secondary to Sjogren's disease  - Previously on carbamazepine 200mg  daily which was stopped due to hospitalization of acute hepatitis, acute renal failure, and encephaloathy  - Hopefully low dose gabapentin 100mg  at bedtime will help  - She is seeing her PCP today, labs requested for review  3.  Elevated blood pressure  - Follow-up with PCP today and monitor at home  Return to clinic in 3 months -------------------------------------------------------------- History of present illness:  In June 2023, she began having right side ear pain and diagnosed with swimmer's ear. She has numbness over the right side over the cheek, jaw, upper and lower lips. No pain involving the nose or forehead.  She complains of skin hypersensitivity on the right cheek.  She denies shooting pain over the right side of the face. She was treated with antibiotics which would temporarily relieve her pain and discomfort.  She saw ENT who ordered CT which showed heterogeneity of the parotid and submandibular glands, consistent with Sjogren's syndrome.  She has been treated with several cycles of antibiotics and steroids, which resolves symptoms when she is taking it, but once the course has been completed, her right parotid  fullness and facial pain returned.    UPDATE 04/28/2022:   At her last visit in August, she was started on carbamazpine for her facial pain.  A month later, she was hospitalized with acute hepatitis, encephalopathy, and pancytopenia thought to be carbamazepine-induced.  She was briefly in the ICU and treated supportively after which she was discharged to rehab. She is here because her bilateral feet tingling and pain has became worse since her hospitalization. She also continues to have low back pain.  She was given gabapentin 100mg  at bedtime, but has not started this until she was seen by me. She continues to have right jaw sensitivity.    Medications:  Current Outpatient Medications on File Prior to Visit  Medication Sig Dispense Refill   albuterol (PROVENTIL HFA;VENTOLIN HFA) 108 (90 Base) MCG/ACT inhaler Inhale 2 puffs into the lungs 2 (two) times daily. For shortness of breath 18 g 0   gabapentin (NEURONTIN) 100 MG capsule Take 1 capsule (100 mg total) by mouth at bedtime. 30 capsule 5   hydrOXYzine (ATARAX) 10 MG tablet Take 1 tablet (10 mg total) by mouth 3 (three) times daily as needed for anxiety. 30 tablet 0   melatonin 5 MG TABS Take 1 tablet (5 mg total) by mouth at bedtime as needed. 30 tablet 0   montelukast (SINGULAIR) 10 MG tablet Take 1 tablet (10 mg total) by mouth at bedtime. 30 tablet 5   Multiple Vitamin (MULTIVITAMIN WITH MINERALS) TABS tablet Take 1 tablet by mouth daily.     pantoprazole (PROTONIX) 40 MG tablet Take 1 tablet (40 mg total) by mouth daily. 30 tablet  5   SYMBICORT 160-4.5 MCG/ACT inhaler INHALE 2 PUFFS INTO THE LUNGS TWICE DAILY (Patient taking differently: Inhale 2 puffs into the lungs in the morning and at bedtime.) 10.2 g 5   triamcinolone (NASACORT) 55 MCG/ACT AERO nasal inhaler Place 1 spray into the nose 2 (two) times daily as needed (nasal symptoms). 1 each 5   No current facility-administered medications on file prior to visit.    Allergies:   Allergies  Allergen Reactions   Sulfa Antibiotics Anaphylaxis, Hives and Swelling    Swelling of the face and tongue    Tegretol [Carbamazepine] Other (See Comments)    Suspected hepatotoxicity and pancytopenia, required inpatient admission 11/2021    Vital Signs:  LMP 05/22/2012   Neurological Exam: MENTAL STATUS including orientation to time, place, person, recent and remote memory, attention span and concentration, language, and fund of knowledge is normal.  Speech is not dysarthric.  CRANIAL NERVES:  Pupils equal round and reactive to light.  Normal conjugate, extra-ocular eye movements in all directions of gaze.  No ptosis.  Hyperesthesia to light touch, temperature, and pin prick over the right V3 distribution.  There is asymmetric fullness of the right parotid gland as compared to the left.  Palate elevates symmetrically.  Tongue is midline.  MOTOR:  Motor strength is 5/5 in all extremities.  No atrophy, fasciculations or abnormal movements.  No pronator drift.  Tone is normal.    MSRs:  Reflexes are 2+/4 throughout, except 3+/4 at bilateral patellas  SENSORY:  Intact to vibration throughout.  COORDINATION/GAIT:  Normal finger-to- nose-finger. Gait narrow based and stable.   Data: MRI lumbar spine wo contrast 04/08/2021: No change demonstrated since the study of 2015. Markedly exaggerated lumbosacral lordosis. Chronic degenerative anterolisthesis at L5-S1 of 8 mm. Note that lumbosacral anatomy is transitional, and the same numbering terminology was used as on the previous examination.   L3-4: Disc bulge. Mild facet and ligamentous hypertrophy. Mild lateral recess narrowing without visible neural compression.   L4-5: Disc bulge. Facet and ligamentous hypertrophy. Mild stenosis of both lateral recesses but without visible neural compression.   L5-S1: Chronic degenerative anterolisthesis of 8 mm. Chronic disc degeneration with pseudo disc herniation. Stenosis of the canal  and neural foramina which could possibly cause neural compression.  CT soft tissue neck w contrast 11/08/2021: 1. Heterogeneity of the parotid and submandibular glands, consistent with the patient's diagnosis of Sjogren syndrome, without sialolithiasis or evidence of inflammatory changes or ductal dilatation to suggest sialadenitis. 2. 1.5 cm hypoenhancing nodule in the left thyroid lobe, previously evaluated with ultrasound on 05/12/2021.  NCS/EMG of the legs 05/05/2021: Chronic L5 radiculopathy affecting the right lower extremity, mild-to-moderate.  There has been mild improvement as compared to prior study on 04/24/2017.   In particular, there is no evidence of a sensorimotor polyneuropathy affecting the lower extremities.    Thank you for allowing me to participate in patient's care.  If I can answer any additional questions, I would be pleased to do so.    Sincerely,    Winta Barcelo K. Posey Pronto, DO

## 2022-04-28 ENCOUNTER — Encounter: Payer: Self-pay | Admitting: Neurology

## 2022-04-28 ENCOUNTER — Ambulatory Visit (INDEPENDENT_AMBULATORY_CARE_PROVIDER_SITE_OTHER): Payer: BC Managed Care – PPO | Admitting: Neurology

## 2022-04-28 VITALS — BP 157/102 | HR 75 | Ht 69.0 in | Wt 168.0 lb

## 2022-04-28 DIAGNOSIS — M48061 Spinal stenosis, lumbar region without neurogenic claudication: Secondary | ICD-10-CM

## 2022-04-28 DIAGNOSIS — G509 Disorder of trigeminal nerve, unspecified: Secondary | ICD-10-CM

## 2022-04-28 NOTE — Patient Instructions (Addendum)
Start physical therapy for low back strengthening  Start gabapentin 100mg  at bedtime  Please request that your labs are faxed to my office  Follow-up with rheumatology   Return to clinic in 3 months

## 2022-05-04 ENCOUNTER — Encounter (HOSPITAL_COMMUNITY): Payer: BC Managed Care – PPO

## 2022-05-09 ENCOUNTER — Ambulatory Visit: Payer: BC Managed Care – PPO | Admitting: Pulmonary Disease

## 2022-05-17 ENCOUNTER — Encounter (HOSPITAL_COMMUNITY): Payer: Self-pay

## 2022-05-17 ENCOUNTER — Ambulatory Visit (HOSPITAL_COMMUNITY): Payer: BC Managed Care – PPO

## 2022-05-24 ENCOUNTER — Encounter (HOSPITAL_COMMUNITY)
Admission: RE | Admit: 2022-05-24 | Discharge: 2022-05-24 | Disposition: A | Discharge status: Home / Self Care | Payer: BC Managed Care – PPO | Source: Ambulatory Visit | Attending: Pulmonary Disease | Admitting: Pulmonary Disease

## 2022-05-24 DIAGNOSIS — R599 Enlarged lymph nodes, unspecified: Secondary | ICD-10-CM

## 2022-05-24 LAB — GLUCOSE, CAPILLARY: Glucose-Capillary: 83 mg/dL (ref 70–99)

## 2022-05-24 MED ORDER — FLUDEOXYGLUCOSE F - 18 (FDG) INJECTION
8.4000 | Freq: Once | INTRAVENOUS | Status: AC
  Administered 2022-05-24: 8.4 via INTRAVENOUS

## 2022-05-26 ENCOUNTER — Ambulatory Visit: Payer: BC Managed Care – PPO | Admitting: Pulmonary Disease

## 2022-05-26 ENCOUNTER — Encounter: Payer: Self-pay | Admitting: Pulmonary Disease

## 2022-05-26 VITALS — BP 128/80 | HR 63 | Temp 98.2°F | Ht 69.0 in | Wt 170.2 lb

## 2022-05-26 DIAGNOSIS — R590 Localized enlarged lymph nodes: Secondary | ICD-10-CM | POA: Diagnosis not present

## 2022-05-26 DIAGNOSIS — R911 Solitary pulmonary nodule: Secondary | ICD-10-CM

## 2022-05-26 DIAGNOSIS — J4550 Severe persistent asthma, uncomplicated: Secondary | ICD-10-CM

## 2022-05-26 NOTE — Progress Notes (Signed)
Megan Reid    PC:[id number]    1965/09/19  Primary Care Physician:Megan Reid  Referring Physician: Leeroy Cha, Reid [address] [place],  [place] [address]  Chief complaint:  Follow up for Asthma [facility], November 2021  HPI: 57 y.o.  with history of asthma, autoimmune disease with rheumatoid arthritis, Sjogren's syndrome  Previously followed by [facility] and rheumatology at [facility].  She was on plaquenil, methotrexate and folic acid, prednisone.  She has not followed up with rheumatology since 2018 and is currently not on treatment.  Last rheumatology note was from [place] in 2018 where they discussed possibly needing leflunomide or CellCept.  But she has not followed back since the co-pay was high at [facility] She had a high-res CT in 2018 with no evidence of interstitial lung disease.  CTA in December 2019 showed some groundglass opacities at the bases but no follow-up CT was done  Has history of asthma for which she is on Symbicort but is using it 2 puffs once a day.  Also has significant allergic rhinitis, GERD Started on Xolair in 2021 but with improvement in the frequency of asthma exacerbations continues to have high symptom burden with persistent dyspnea, needing to use rescue medication several times a day and persistent cough We discussed changing Biologics to tezspire but she is reluctant to make the switch  Follows with [facility] for food allergies She is being followed by Megan [facility] for thyroid nodules and ultrasound has been ordered  Pets: Has a cat.  No birds, farm animal Occupation: Pharmacist, hospital for fourth grade Exposures: Reports mold exposure at school but currently she is working remotely from home.  She also has a comfortable.  No hot tubs, Jacuzzi's, humidifier Smoking history: Remote smoking history as a teenager Travel history: Previously lived in [place], [place].  She has been in [place] for the past 7  years.  No significant recent travel Relevant family history: Father had emphysema.  He was a smoker.  Interim history: Continues on Xolair, inhalers, Singulair  She had CT scan and PET scan for evaluation of mediastinal lymphadenopathy and is here for review of results.  Outpatient Encounter Medications as of 05/26/2022  Medication Sig   albuterol (PROVENTIL HFA;VENTOLIN HFA) 108 (90 Base) MCG/ACT inhaler Inhale 2 puffs into the lungs 2 (two) times daily. For shortness of breath   gabapentin (NEURONTIN) 100 MG capsule Take 1 capsule (100 mg total) by mouth at bedtime.   hydrOXYzine (ATARAX) 10 MG tablet Take 1 tablet (10 mg total) by mouth 3 (three) times daily as needed for anxiety.   melatonin 5 MG TABS Take 1 tablet (5 mg total) by mouth at bedtime as needed.   montelukast (SINGULAIR) 10 MG tablet Take 1 tablet (10 mg total) by mouth at bedtime.   Multiple Vitamin (MULTIVITAMIN WITH MINERALS) TABS tablet Take 1 tablet by mouth daily.   pantoprazole (PROTONIX) 40 MG tablet Take 1 tablet (40 mg total) by mouth daily.   SYMBICORT 160-4.5 MCG/ACT inhaler INHALE 2 PUFFS INTO THE LUNGS TWICE DAILY (Patient taking differently: Inhale 2 puffs into the lungs in the morning and at bedtime.)   triamcinolone (NASACORT) 55 MCG/ACT AERO nasal inhaler Place 1 spray into the nose 2 (two) times daily as needed (nasal symptoms).   No facility-administered encounter medications on file as of 05/26/2022.   Physical Exam: Blood pressure 128/80, pulse 63, temperature 98.2 F (36.8 C), temperature source  Oral, height 5' 9"$  (1.753 m), weight 170 lb 3.2 oz (77.2 kg), last menstrual period 05/22/2012, SpO2 97 %. Gen:      No acute distress HEENT:  EOMI, sclera anicteric Neck:     No masses; no thyromegaly Lungs:    Clear to auscultation bilaterally; normal respiratory effort CV:         Regular rate and rhythm; no murmurs Abd:      + bowel sounds; soft, non-tender; no palpable masses, no distension Ext:    No  edema; adequate peripheral perfusion Skin:      Warm and dry; no rash Neuro: alert and oriented x 3 Psych: normal mood and affect   Data Reviewed: Imaging: CT high-resolution 09/21/2016-biapical scarring.  No evidence of interstitial lung disease.  4 mm nodule in the left major fissure. CTA 04/14/2018 no PE, patchy groundglass opacities and reticulation in the mid to lower lungs. Chest x-ray 04/26/2018-no active cardiopulmonary disease CTA 12/05/2019-no PE, subsegmental atelectasis, stable reticular changes in the right middle lobe and right lower lobe.  CT high-resolution 01/14/2020-interstitial opacity in the right middle lobe, minimal scarring.  No evidence of ILD CT 03/18/2021-mild patchy groundglass opacities which are unchanged, interval resolution of left lower lobe lung nodule, new 3 mm nodule in the left lower lobe. CT scan 03/20/2022-no interstitial lung disease, increase in mediastinal axillary and upper abdominal lymph node.  3 mm lung nodule PET scan 05/24/2022-small bilateral axillary and mediastinal lymphadenopathy with no uptake I have reviewed the images personally.  PFTs  04/09/2019 FVC 3.60 [102%], FEV1 2.73 [97%], F/F 76, TLC 5.09 [5%], DLCO 17.46 [69%]  02/04/2019 FVC 3.40 [97%], FEV1 2.67 [96%], F/F 79, TLC 5.31 [89%], DLCO 17.65 [70%] Minimal obstructive airway disease with minimal diffusion defect  02/04/2020 FVC 3.40 [19%], FEV1 2.67 [96%], F/F 79, TLC 5.31 [9%], DLCO 17.65 [90%], Minimal obstruction, minimal diffusion defect  ACT score 01/09/2020-10 ACT score 03/10/2020-11 ACT score 08/06/2020-12 ACT score 12/03/20- 15  Labs: CBC 10/09/2018-WBC 3.6, hemoglobin 9.5, platelets 129, eos 0% CBC 02/04/2020-WBC 2.2, eos 0% IgE 12/30/2019-1726  Hypersensitivity panel 02/07/2019-negative  Assessment:  Mediastinal lymphadenopathy CT and PET scan reviewed.  Reassuringly there is no uptake on PET scan and suspect that findings are reactive in nature.  Asthma Xolair has  made improvement in frequency of exacerbations. But she continues to have symptoms of chronic cough, occasional dyspnea She is not interested in making the switch to [employer]. She does not qualify for other biologics as her peripheral eosinophil cell count is low  Continue Symbicort, Singulair and Spiriva  Allergic rhinitis, GERD Has significant issues with rhinitis, GERD Continue Protonix once a day.  Does not want to use twice a day for fear of side effects I will asked her to resume Flonase, she will use chlorpheniramine over-the-counter for postnasal drip  She has been treated already referred by allergy for ENT referral to evaluate chronic cough  Autoimmune disease, arthritis, Sjogren's syndrome Previously followed by Megan [facility], rheumatology and at [facility] rheumatology since she has not been seen since 2019 Exposure history notable for down comforters.  She has gotten rid of them earlier in 2020 No evidence of ILD on high-res CT  She has raised the concern for sarcoidosisbut I do not see any evidence of this as his CT scan is not typical.   Plan/Recommendations: - Continue inhalers, Singulair, Xolair - Follow-up high-res CT in 1 year Follow-up in 6 months  Marshell Garfinkel [facility] 05/26/2022, 9:28 AM  CC: Megan Cha

## 2022-05-26 NOTE — Patient Instructions (Signed)
I am glad you are stable with your breathing Your PET scan does not show any findings of concern Will continue to follow the small lung nodules Order follow-up high-res CT in 1 year Return to clinic in 6 months
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
## 2022-05-29 ENCOUNTER — Encounter
Payer: BC Managed Care – PPO | Attending: Physical Medicine and Rehabilitation | Admitting: Physical Medicine and Rehabilitation

## 2022-05-29 VITALS — BP 143/92 | HR 91 | Ht 69.0 in

## 2022-05-29 DIAGNOSIS — M24852 Other specific joint derangements of left hip, not elsewhere classified: Secondary | ICD-10-CM | POA: Insufficient documentation

## 2022-05-29 DIAGNOSIS — M5417 Radiculopathy, lumbosacral region: Secondary | ICD-10-CM | POA: Diagnosis present

## 2022-05-29 DIAGNOSIS — F411 Generalized anxiety disorder: Secondary | ICD-10-CM | POA: Diagnosis present

## 2022-05-29 DIAGNOSIS — G3184 Mild cognitive impairment, so stated: Secondary | ICD-10-CM | POA: Diagnosis present

## 2022-05-29 MED ORDER — BUSPIRONE HCL 5 MG PO TABS: 5.0000 mg | ORAL_TABLET | Freq: Two times a day (BID) | ORAL | 3 refills | Status: AC

## 2022-05-29 NOTE — Patient Instructions (Signed)
I have started you on Buspar 5 mg tablets. Take 1 tablet twice daily for anxiety; if you have any side effects or concerns please let me know.  I will see you in 1-2 weeks for an injection of your left greater trochanteric bursa. Please tell your PT your doctor is concerned for snapping hip syndrome so they can tailor your therapies.  I will refer you to Dr. Letta Pate for L L5 epidural steroid injection  Follow up with me in 3 months

## 2022-06-02 NOTE — Assessment & Plan Note (Signed)
If things not improving or getting worse despite Tx anxiety, may consider MRI brain at subsequent visits

## 2022-06-02 NOTE — Assessment & Plan Note (Signed)
I have started you on Buspar 5 mg tablets. Take 1 tablet twice daily for anxiety; if you have any side effects or concerns please let me know.  Follow up with me in 3 months

## 2022-06-02 NOTE — Assessment & Plan Note (Signed)
I will refer you to Dr. Letta Pate for L L5 epidural steroid injection

## 2022-06-02 NOTE — Assessment & Plan Note (Signed)
I will see you in 1-2 weeks for an injection of your left greater trochanteric bursa. Please tell your PT your doctor is concerned for snapping hip syndrome so they can tailor your therapies.

## 2022-06-12 ENCOUNTER — Encounter: Payer: BC Managed Care – PPO | Admitting: Physical Medicine and Rehabilitation

## 2022-07-01 ENCOUNTER — Other Ambulatory Visit: Payer: Self-pay

## 2022-07-01 DIAGNOSIS — D709 Neutropenia, unspecified: Secondary | ICD-10-CM

## 2022-07-01 DIAGNOSIS — R221 Localized swelling, mass and lump, neck: Secondary | ICD-10-CM

## 2022-07-04 ENCOUNTER — Encounter
Payer: BC Managed Care – PPO | Attending: Physical Medicine and Rehabilitation | Admitting: Physical Medicine & Rehabilitation

## 2022-07-04 DIAGNOSIS — G3184 Mild cognitive impairment, so stated: Secondary | ICD-10-CM | POA: Insufficient documentation

## 2022-07-04 DIAGNOSIS — M5417 Radiculopathy, lumbosacral region: Secondary | ICD-10-CM | POA: Insufficient documentation

## 2022-07-04 DIAGNOSIS — M24852 Other specific joint derangements of left hip, not elsewhere classified: Secondary | ICD-10-CM | POA: Insufficient documentation

## 2022-07-04 DIAGNOSIS — F411 Generalized anxiety disorder: Secondary | ICD-10-CM | POA: Insufficient documentation

## 2022-07-17 ENCOUNTER — Encounter: Payer: Self-pay | Admitting: Physical Medicine and Rehabilitation

## 2022-07-17 ENCOUNTER — Encounter
Payer: BC Managed Care – PPO | Attending: Physical Medicine and Rehabilitation | Admitting: Physical Medicine and Rehabilitation

## 2022-07-17 VITALS — BP 124/84 | HR 81 | Temp 98.3°F | Ht 69.0 in | Wt 174.0 lb

## 2022-07-17 DIAGNOSIS — M24852 Other specific joint derangements of left hip, not elsewhere classified: Secondary | ICD-10-CM | POA: Diagnosis not present

## 2022-07-17 DIAGNOSIS — M7062 Trochanteric bursitis, left hip: Secondary | ICD-10-CM | POA: Insufficient documentation

## 2022-07-17 MED ORDER — LIDOCAINE HCL 1 % IJ SOLN
3.0000 mL | Freq: Once | INTRAMUSCULAR | Status: AC
  Administered 2022-07-17: 3 mL

## 2022-07-17 MED ORDER — TRIAMCINOLONE ACETONIDE 40 MG/ML IJ SUSP
40.0000 mg | Freq: Once | INTRAMUSCULAR | Status: AC
  Administered 2022-07-17: 40 mg

## 2022-07-17 NOTE — Patient Instructions (Addendum)
-   Resume Usual Activities. Notify Physician of any unusual bleeding, erythema or concern for side effects as reviewed above. - Apply ice prn for pain - Tylenol prn for pain - I have sent you to PT at [place] your appointment with Dr. Letta Pate if your back and and numbness/tingling gets worse - Follow up with me as needed

## 2022-07-17 NOTE — Progress Notes (Signed)
Megan Reid is a 57 y.o. year old female  who  has a past medical history of Arthralgia (02/14/2016), Asthma, Autoimmune disease (02/14/2016), DDD (degenerative disc disease), lumbar (02/14/2016), Fatigue (02/14/2016), Gastritis (02/14/2016), Hot flashes, Migraines (02/14/2016), Osteoarthritis of both hands (02/14/2016), Osteoarthritis of both knees (02/14/2016), and Vitamin D deficiency (02/14/2016).   They are presenting to PM&R clinic for follow up related to L hip greater trochanteric bursitis .   HPI: CECILLIA Reid is a 57 y.o. female with PMHx has Autoimmune disease; Fatigue; DDD (degenerative disc disease), lumbar; Osteoarthritis of both knees; Osteoarthritis of both hands; Asthma; Migraines; Gastritis; Vitamin D deficiency; Sjogren's syndrome with keratoconjunctivitis sicca; High risk medication use; Rheumatoid factor positive; Leucopenia; Abrasion of right ankle; Adverse reaction to food, subsequent encounter; Heartburn; Bruising; Seasonal and perennial allergic rhinitis; Chronic coughing; Sjogren's syndrome; Leukopenia; Thrombocytopenia; Toxic metabolic encephalopathy; Anemia; Abnormal LFTs; Decreased mobility and endurance; Difficulty sleeping; Dairy product intolerance; Mild cognitive impairment; Paresthesia of left leg; Gastroesophageal reflux disease; Snapping hip syndrome, left; Lumbosacral radiculopathy at L5; and Anxiety state on their problem list. who presents to clinic for treatment of pain related to L greater trochanteric bursa  via injection as described below.    No new concerns or complaints. No major changes in medical history since last visit.   Physical Exam:  General: Appropriate appearance for age.  Mental Status: Appropriate mood and affect.  Cardiovascular: RRR, no m/r/g.  Respiratory: CTAB, no rales/rhonchi/wheezing.  Skin: No apparent rashes or lesions.  Neuro: Awake, alert, and oriented x3. No apparent deficits.  MSK:  Moving all 4 limbs antigravity and  against resistance.  + TTP L>R GTB  PROCEDURE:  Left   greater trochanteric bursa  injection Diagnosis:    ICD-10-CM   1. Greater trochanteric bursitis of left hip  M70.62 Ambulatory referral to Physical Therapy    triamcinolone acetonide (KENALOG-40) injection 40 mg    lidocaine (XYLOCAINE) 1 % (with pres) injection 3 mL    2. Snapping hip syndrome, left  M24.852 Ambulatory referral to Physical Therapy    triamcinolone acetonide (KENALOG-40) injection 40 mg    lidocaine (XYLOCAINE) 1 % (with pres) injection 3 mL      Goals with treatment: [ x ] Decrease pain [   ] Improve Active / Passive ROM [   ] Improve ADLs [ x ] Improve functional mobility  MEDICATION:  [ x ] Kenalog 40 mg/mL  [ X ] Lidocaine 1%    CONSENT: Obtained in writing per policy. Consent uploaded to chart.  Benefits discussed.  Risks discussed included, but were not limited to, pain and discomfort, bleeding, bruising, allergic reaction, infection. All questions answered to patient/family member/guardian/ caregiver satisfaction. They would like to proceed with procedure. There are no noted contraindications to procedure.  PROCEDURE Time out was preformed No heat sources No antibiotics  The patient was explained about both the benefits and risks of a  Left  greater trochanteric bursa  injection. After the patient acknowledged an understanding of the risks and benefits, the patient agreed to proceed. The area was first marked and then prepped in an aseptic fashion with betadine / alcohol. A 27 g, 1.5 inch needle was directed via a lateral/direct approach into the  Left  greater trochanteric bursa. The injection was completed with Kenalog 40 mg/ml 1 cc mixed with 3 cc of 1% lidocaine after no blood was aspirated on pull back.   No complications were encountered. The patient tolerated the procedure well.  Impression: HPI:  Megan Reid is a 57 y.o. female with PMHx has Autoimmune disease; Fatigue; DDD  (degenerative disc disease), lumbar; Osteoarthritis of both knees; Osteoarthritis of both hands; Asthma; Migraines; Gastritis; Vitamin D deficiency; Sjogren's syndrome with keratoconjunctivitis sicca; High risk medication use; Rheumatoid factor positive; Leucopenia; Abrasion of right ankle; Adverse reaction to food, subsequent encounter; Heartburn; Bruising; Seasonal and perennial allergic rhinitis; Chronic coughing; Sjogren's syndrome; Leukopenia; Thrombocytopenia; Toxic metabolic encephalopathy; Anemia; Abnormal LFTs; Decreased mobility and endurance; Difficulty sleeping; Dairy product intolerance; Mild cognitive impairment; Paresthesia of left leg; Gastroesophageal reflux disease; Snapping hip syndrome, left; Lumbosacral radiculopathy at L5; and Anxiety state on their problem list. who presents to clinic for treatment of L Greater trochanterric bursitis pain . They received a   Left  greater trochanteric bursa  injection as above.   PLAN: - Resume Usual Activities. Notify Physician of any unusual bleeding, erythema or concern for side effects as reviewed above. - Apply ice prn for pain - Tylenol prn for pain - I have sent you to PT at [facility]  - Reschedule your appointment with Dr. Wynn Banker if your back and and numbness/tingling gets worse - Follow up with me as needed  Patient/Care Megan Reid was ready to learn without apparent learning barriers. Education was provided on diagnosis, treatment options/plan according to patient's preferred learning style. Patient/Care Giver verbalized understanding and agreement with the above plan.   Megan Sheriff, DO 07/17/2022

## 2022-07-23 DIAGNOSIS — M7062 Trochanteric bursitis, left hip: Secondary | ICD-10-CM | POA: Insufficient documentation

## 2022-07-27 ENCOUNTER — Encounter: Payer: Self-pay | Admitting: Physical Therapy

## 2022-07-27 ENCOUNTER — Ambulatory Visit: Payer: BC Managed Care – PPO | Attending: Physical Medicine and Rehabilitation | Admitting: Physical Therapy

## 2022-07-27 DIAGNOSIS — M7062 Trochanteric bursitis, left hip: Secondary | ICD-10-CM | POA: Diagnosis not present

## 2022-07-27 DIAGNOSIS — M25552 Pain in left hip: Secondary | ICD-10-CM | POA: Insufficient documentation

## 2022-07-27 DIAGNOSIS — G629 Polyneuropathy, unspecified: Secondary | ICD-10-CM | POA: Diagnosis present

## 2022-07-27 DIAGNOSIS — M5459 Other low back pain: Secondary | ICD-10-CM | POA: Insufficient documentation

## 2022-07-27 DIAGNOSIS — M24852 Other specific joint derangements of left hip, not elsewhere classified: Secondary | ICD-10-CM | POA: Diagnosis not present

## 2022-07-27 NOTE — Therapy (Signed)
OUTPATIENT PHYSICAL THERAPY NEURO EVALUATION   Patient Name: Megan Reid MRN: [id number] DOB:01/24/1966, 57 y.o., female Today's Date: 07/27/2022   PCP: Lorenda Ishiharaupashree, Varadarajan  REFERRING PROVIDER: Angelina SheriffEngler, Morgan C, DO      Past Medical History:  Diagnosis Date   Arthralgia 02/14/2016   Asthma    Autoimmune disease 02/14/2016   Positive RF 139 CCP Negative  Positive ANA 1:640 Positive Ro Positive La Elevated ESR 105 - Sjorgen's Disease   DDD (degenerative disc disease), lumbar 02/14/2016   Fatigue 02/14/2016   Gastritis 02/14/2016   Hot flashes    Migraines 02/14/2016   Osteoarthritis of both hands 02/14/2016   Osteoarthritis of both knees 02/14/2016   Vitamin D deficiency 02/14/2016   Past Surgical History:  Procedure Laterality Date   BREAST BIOPSY Left    ROTATOR CUFF REPAIR Right 06/16/2019   TUBAL LIGATION     Patient Active Problem List   Diagnosis Date Noted   Greater trochanteric bursitis of left hip 07/23/2022   Snapping hip syndrome, 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   Lumbosacral radiculopathy at L5 05/29/2022   Anxiety state 05/29/2022   Gastroesophageal reflux disease 03/08/2022   Decreased mobility and endurance 01/25/2022   Difficulty sleeping 01/25/2022   Dairy product intolerance 01/25/2022   Mild cognitive impairment 01/25/2022   Paresthesia of left leg 01/25/2022   Anemia 12/20/2021   Abnormal LFTs 12/20/2021   Toxic metabolic encephalopathy 12/12/2021   Thrombocytopenia 12/04/2021   Seasonal and perennial allergic rhinitis 12/02/2020   Chronic coughing 12/02/2020   Sjogren's syndrome 12/02/2020   Leukopenia 12/02/2020   Adverse reaction to food, subsequent encounter 06/23/2020   Heartburn 06/23/2020   Bruising 06/23/2020   Abrasion of right ankle 02/13/2017   Rheumatoid factor positive 09/01/2016   Leucopenia 09/01/2016   Autoimmune disease 02/14/2016   Fatigue 02/14/2016   DDD (degenerative disc disease), lumbar 02/14/2016   Osteoarthritis of both  knees 02/14/2016   Osteoarthritis of both hands 02/14/2016   Asthma 02/14/2016   Migraines 02/14/2016   Gastritis 02/14/2016   Vitamin D deficiency 02/14/2016   Sjogren's syndrome with keratoconjunctivitis sicca 02/14/2016   High risk medication use 02/14/2016    ONSET DATE: 12/21/2021   REFERRING DIAG: left hip pain and lumbar stenosis  THERAPY DIAG:  No diagnosis found.  Rationale for Evaluation and Treatment Rehabilitation  SUBJECTIVE:                                                                                                                                                                                              SUBJECTIVE STATEMENT: Patient reports that she has had back and hip pain  since about August 2024, she has been diagnosed with lumbar stenosis with some neuropathy and hip bursitis    Pt accompanied by: self  PERTINENT HISTORY: Brief HPI:   Megan Reid is a 58 y.o. female with history of DDD lumbar spine, asthma, autoimmune disease, Sjogren's with recent onset of trigeminal neuralgia and was started on Tegretol.  She was also reported to have been taking ibuprofen as well as Tylenol for pain management.  She was found to have metabolic encephalopathy with acute hepatic and renal failure.  Renal failure treated with IV fluids and bicarb .  AKI felt to be due to ATN from acute illness, diarrhea and ibuprofen use.  She developed fever past admission and was treated with IV antibiotics due to concerns of sepsis.  Work-up was negative for DR ESS, tickborne illness, CMV, EBV and acute hepatitis/pancytopenia felt to be secondary to Tegretol toxicity.  She did not have pancytopenia and supportive care was recommended by heme-onc.  Renal status and abnormal LFTs were resolving however patient continued limited by weakness, DOE as well as encephalopathy with delayed processing.  CIR was recommended due to functional decline.     Hospital Course: Megan Reid was admitted  to rehab 12/12/2021 for inpatient therapies to consist of PT, ST and OT at least three hours five days a week. Past admission physiatrist, therapy team and rehab RN have worked together to provide customized collaborative inpatient rehab.  Her blood pressures were monitored on TID basis and has been stable.  Follow-up check of electrolytes revealed renal status as well as abnormal LFTs to be gradually improving.  She was found to have drop in magnesium to 1.3 requiring IV supplementation as well as p.o. supplement.  Repeat check showed improvement with magnesium of 2.0.  Recommend repeat check in 1 to 2 weeks to monitor for stability as well as need to continue oral supplementation.   Follow-up CBC showed improvement in white count with stable H&H and that thrombocytopenia had resolved.  Her alertness and mentation has gradually improved with improvement in activity tolerance and respiratory status.  Hydroxyzine 10 mg 3 times daily as needed was added to help manage anxiety.  Sleep-wake disruption is improved with addition of melatonin.  Constipation has resolved with augmentation of bowel regimen.  She has made steady gains during her rehab stay and is modified independent at discharge.  Supervision is recommended with cognitive task and for safety.  She will continue to receive follow-up outpatient PT, OT and ST at [facility] outpatient rehab after discharge.     Rehab course: During patient's stay in rehab weekly team conferences were held to monitor patient's progress, set goals and discuss barriers to discharge. At admission, patient required min assist with mobility and with ADL tasks.  She exhibited mild cognitive deficits affecting recall, memory and executive function with SLUMS score 22/30. She  has had improvement in activity tolerance, balance, postural control as well as ability to compensate for deficits. She is able to complete ADL tasks at modified independent level. She is independent for  transfers and is able to ambulate 150' without AD. She is able to climb 12 stairs independently. She requires supervision with functional and complex cognitive tasks. Her SLUMS score has improved to 26/30. Family education has been completed.    Disposition: Home  PAIN:  Are you having pain? Yes, pain in the left hip a 6/10, back pain is a 5/10 Aggravating:  sit, lie down pain can increase Relieving:  stretch helps, reports that she stopped the medication due to wanting to not be on it. She also is having an increase pain in the left calf, she had a negative Homan's test, no temperature  PRECAUTIONS: Fall  WEIGHT BEARING RESTRICTIONS No  FALLS: Has patient fallen in last 6 months? No falls LIVING ENVIRONMENT: Lives with: lives with their spouse Lives in: House/apartment Stairs: 4 STE B rails, home is handicap accessible except for bathtub  Has following equipment at home: None  PLOF: Independent and Independent with basic ADLs  Teacher special education  PATIENT GOALS build strength and balance, have less pain  OBJECTIVE:   DIAGNOSTIC FINDINGS:  lumbar stenosis  COGNITION: WNL    SENSATION: Not tested  MUSCLE LENGTH:  tight HS, calf, piriformis SLR 45 degrees with pain, very tight and tender in the ITB  POSTURE: decreased lordosis   LUMBAR ROM: decreased 50% with pain  LOWER EXTREMITY ROM:     Active  Right Eval Left Eval  Hip flexion    Hip extension 10 10  Hip abduction 15 15  Hip adduction    Hip internal rotation    Hip external rotation    Knee flexion    Knee extension    Ankle dorsiflexion    Ankle plantarflexion    Ankle inversion    Ankle eversion     (Blank rows = not tested)  LOWER EXTREMITY MMT:    MMT Right Eval Left Eval  Hip flexion 4- 3+  Hip extension 3+ 3+  Hip abduction 3+ 3+ P!  Hip adduction    Hip internal rotation    Hip external rotation    Knee flexion 4 4  Knee extension 4 4  Ankle dorsiflexion 3 3  Ankle  plantarflexion    Ankle inversion    Ankle eversion    (Blank rows = not tested)  TRANSFERS: Assistive device utilized: None  Sit to stand: Complete Independence Stand to sit: Complete Independence Chair to chair: Complete Independence Floor:   STAIRS: does one at a time due to pain    GAIT: Gait pattern: WFL and trendelenburg left Distance walked: 100 feet Assistive device utilized: None Level of assistance: Complete Independence Comments: sore and tired   FUNCTIONAL TESTs:  TUG 16 seconds 5XSTS 22 seconds with pain PATIENT SURVEYS:  FOTO = 44  TODAY'S TREATMENT:  07/27/22   Ionto 28mA dose left GT area    PATIENT EDUCATION: Education details: exam findings, POC, HEP  Person educated: Patient Education method: Explanation, Demonstration, and Handouts Education comprehension: verbalized understanding and returned demonstration   HOME EXERCISE PROGRAM: Access Code: [email or web address] URL: [email or web address] Date: 07/27/2022 Prepared by: Stacie Glaze  Exercises - Seated Hamstring Stretch with Chair  - 2 x daily - 7 x weekly - 1 sets - 5 reps - 30 hold - Supine Piriformis Stretch Pulling Heel to Hip  - 2 x daily - 7 x weekly - 1 sets - 5 reps - 30 hold - Supine ITB Stretch with Strap  - 2 x daily - 7 x weekly - 1 sets - 5 reps - 30 hold - Supine Lower Trunk Rotation  - 2 x daily - 7 x weekly - 1 sets - 10 reps - 10 hold - Hooklying Single Knee to Chest  - 2 x daily - 7 x weekly - 1 sets - 10 reps - 10 hold    GOALS: Goals reviewed with patient? Yes  SHORT TERM GOALS: Target date: 08/18/22  Will be  compliant with appropriate progressive HEP  Goal status: INITIAL   LONG TERM GOALS: Target date: 10/26/22  MMT to improve by at least 1 grade in all weak groups  Goal status: INITIAL  2.  Decrease pain 50% Goal status: INITIAL  3. Increase lumbar ROM 25% Goal status: INITIAL  4.  Will be independent with appropriate gym based exercise program to  maintain functional gains and continue progress on an independent basis  Goal status: INITIAL   5:  go up and down stairs step over step ASSESSMENT:  CLINICAL IMPRESSION: Patient is a 57 y.o. F who was seen today for physical therapy evaluation and treatment for care lumbar stenosis and hip GT bursitis.  She reports that she has had this for quite some time but it is really hurting more and causing limitations to her ability to walk, go up and down stairs.  She also reports neuropathy in her feet with decreasing balance.  She is very tight in the HS, ITB and piriformis mms, she does have some left medial calf pain as well  Will benefit from skilled PT services to return to PLOF.    OBJECTIVE IMPAIRMENTS cardiopulmonary status limiting activity, decreased activity tolerance, decreased balance, decreased cognition, decreased mobility, difficulty walking, decreased strength, and improper body mechanics.   REHAB POTENTIAL: Good  CLINICAL DECISION MAKING: Stable/uncomplicated  EVALUATION COMPLEXITY: Low  PLAN: PT FREQUENCY: 1-2x/week  PT DURATION: 12 weeks  PLANNED INTERVENTIONS: Therapeutic exercises, Therapeutic activity, Neuromuscular re-education, Balance training, Gait training, Patient/Family education, Self Care, Joint mobilization, Stair training, Dry Needling, Cognitive remediation, Electrical stimulation, Cryotherapy, Moist heat, Taping, Ultrasound, Ionotophoresis 4mg /ml Dexamethasone, and Manual therapy  PLAN FOR NEXT SESSION: ionto, traction, flexibility and stability  Megan Reid, PT 07/27/2022, 1:24 PM

## 2022-08-03 ENCOUNTER — Other Ambulatory Visit: Payer: BC Managed Care – PPO

## 2022-08-03 ENCOUNTER — Ambulatory Visit: Payer: BC Managed Care – PPO | Admitting: Oncology

## 2022-08-04 ENCOUNTER — Encounter: Payer: Self-pay | Admitting: Hematology

## 2022-08-04 ENCOUNTER — Inpatient Hospital Stay: Payer: BC Managed Care – PPO | Attending: Hematology

## 2022-08-04 ENCOUNTER — Inpatient Hospital Stay (HOSPITAL_BASED_OUTPATIENT_CLINIC_OR_DEPARTMENT_OTHER): Payer: BC Managed Care – PPO | Admitting: Hematology

## 2022-08-04 VITALS — BP 133/88 | HR 76 | Temp 98.8°F | Resp 18 | Ht 69.0 in | Wt 174.0 lb

## 2022-08-04 DIAGNOSIS — D709 Neutropenia, unspecified: Secondary | ICD-10-CM | POA: Diagnosis present

## 2022-08-04 DIAGNOSIS — R21 Rash and other nonspecific skin eruption: Secondary | ICD-10-CM | POA: Diagnosis not present

## 2022-08-04 DIAGNOSIS — R600 Localized edema: Secondary | ICD-10-CM | POA: Diagnosis not present

## 2022-08-04 DIAGNOSIS — D696 Thrombocytopenia, unspecified: Secondary | ICD-10-CM | POA: Diagnosis not present

## 2022-08-04 DIAGNOSIS — D72818 Other decreased white blood cell count: Secondary | ICD-10-CM

## 2022-08-04 DIAGNOSIS — R221 Localized swelling, mass and lump, neck: Secondary | ICD-10-CM

## 2022-08-04 LAB — CBC WITH DIFFERENTIAL (CANCER CENTER ONLY)
Abs Immature Granulocytes: 0 10*3/uL (ref 0.00–0.07)
Basophils Absolute: 0 10*3/uL (ref 0.0–0.1)
Basophils Relative: 0 %
Eosinophils Absolute: 0 10*3/uL (ref 0.0–0.5)
Eosinophils Relative: 0 %
HCT: 33.5 % — ABNORMAL LOW (ref 36.0–46.0)
Hemoglobin: 10.9 g/dL — ABNORMAL LOW (ref 12.0–15.0)
Immature Granulocytes: 0 %
Lymphocytes Relative: 47 %
Lymphs Abs: 1.2 10*3/uL (ref 0.7–4.0)
MCH: 28.9 pg (ref 26.0–34.0)
MCHC: 32.5 g/dL (ref 30.0–36.0)
MCV: 88.9 fL (ref 80.0–100.0)
Monocytes Absolute: 0.3 10*3/uL (ref 0.1–1.0)
Monocytes Relative: 12 %
Neutro Abs: 1.1 10*3/uL — ABNORMAL LOW (ref 1.7–7.7)
Neutrophils Relative %: 41 %
Platelet Count: 120 10*3/uL — ABNORMAL LOW (ref 150–400)
RBC: 3.77 MIL/uL — ABNORMAL LOW (ref 3.87–5.11)
RDW: 15.5 % (ref 11.5–15.5)
WBC Count: 2.6 10*3/uL — ABNORMAL LOW (ref 4.0–10.5)
nRBC: 0 % (ref 0.0–0.2)

## 2022-08-04 NOTE — Progress Notes (Signed)
[facility]   [phone] [phone]   Clinic Follow up Note   Patient Care Team: [name], MD as PCP - General (Internal Medicine) Pollyann Savoy, MD as Consulting Physician (Rheumatology) Gerald Leitz, MD as Consulting Physician (Obstetrics and Gynecology) Glendale Chard, DO as Consulting Physician (Neurology) [place]ale Chard, DO as Consulting Physician (Neurology) Malachy Mood, MD as Consulting Physician (Hematology and Oncology)  Date of Service:  08/04/2022  CHIEF COMPLAINT: f/u of neutropenia  CURRENT THERAPY:  Active surveillance  ASSESSMENT:  Megan Reid is a 57 y.o. 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 -Diagnosed in 2013.  She had a bone marrow biopsy which showed no evidence of dysplasia or other malignancy.  PET scan in October 2015 was also negative for adenopathy or malignancy. -She has not had recurrent infections, or required any treatment, given the mild degree of neutropenia. -She was hospitalized in August 2023 after presenting with encephalopathy and acute hepatitis likely related to carbamazepine.  She developed severe neutropenia and thrombocytopenia during hospital stay, but recovered very well.  -We discussed the risk of infection, and preventive strategies such as antibiotics for dental procedure etc. -Lab reviewed, overall stable, will continue monitoring.  Left lower extremity edema and skin discoloration -She complains of chronic left lower extremity edema for 2 years, intermittent rash, skin discoloration. -Exam reviewed mild pitting edema in left lower extremity below knee, with diagnosis in her feet, normal skin temperature and pulses. -I recommend Doppler of left lower extremity to rule out DVT next week -If DVT negative, this is likely venous stasis.  I recommend her to wear compression stocks, and elevate her legs.   PLAN: -Lab reviewed,  ANC 1.1, platelet count 120, overall stable -Doppler of left lower extremity to rule out DVT next week -Lab and follow-up in 1 year.  She will repeat a CBC with her primary care physician every 6 months.  She knows to call me if she has any concerns.   INTERVAL HISTORY:  Megan Reid is here for a follow up of neutropenia. She was last seen by Dr. Clelia Croft on February 02, 2022. She presents to the clinic alone. She is clinically doing well overall. She told me how sick she was in hospital in 11/2021. She still has mild residual cognitive deficiency. She also complains of left leg and intermittent rash for 2 years, which she has discussed with other providers, but no workup has been done.   All other systems were reviewed with the patient and are negative.  MEDICAL HISTORY:  Past Medical History:  Diagnosis Date   Arthralgia 02/14/2016   Asthma    Autoimmune disease 02/14/2016   Positive RF 139 CCP Negative  Positive ANA 1:640 Positive Ro Positive La Elevated ESR 105 - Sjorgen's Disease   DDD (degenerative disc disease), lumbar 02/14/2016   Fatigue 02/14/2016   Gastritis 02/14/2016   Hot flashes    Migraines 02/14/2016   Osteoarthritis of both hands 02/14/2016   Osteoarthritis of both knees 02/14/2016   Vitamin D deficiency 02/14/2016    SURGICAL HISTORY: Past Surgical History:  Procedure Laterality Date   BREAST BIOPSY Left    ROTATOR CUFF REPAIR Right 06/16/2019   TUBAL LIGATION      I have reviewed the social history and family history with the patient and they are unchanged from previous note.  ALLERGIES:  is allergic to misc. sulfonamide containing compounds, strawberry extract, sulfa antibiotics, and tegretol [carbamazepine].  MEDICATIONS:  Current Outpatient Medications  Medication Sig Dispense Refill   albuterol (PROVENTIL HFA;VENTOLIN HFA) 108 (90 Base) MCG/ACT inhaler Inhale 2 puffs into the lungs 2 (two) times daily. For shortness of breath 18 g 0   busPIRone  (BUSPAR) 5 MG tablet Take 1 tablet (5 mg total) by mouth 2 (two) times daily. 60 tablet 3   chlorpheniramine-HYDROcodone (TUSSIONEX) 10-8 MG/5ML Take by mouth.     fluticasone (FLONASE) 50 MCG/ACT nasal spray 2 sprays.     fluticasone-salmeterol (ADVAIR) 100-50 MCG/ACT AEPB Inhale into the lungs.     gabapentin (NEURONTIN) 100 MG capsule Take 1 capsule (100 mg total) by mouth at bedtime. 30 capsule 5   hydrochlorothiazide (HYDRODIURIL) 12.5 MG tablet Take 12.5 mg by mouth every morning.     hydrOXYzine (ATARAX) 10 MG tablet Take 1 tablet (10 mg total) by mouth 3 (three) times daily as needed for anxiety. 30 tablet 0   levalbuterol (XOPENEX HFA) 45 MCG/ACT inhaler Inhale into the lungs.     melatonin 5 MG TABS Take 1 tablet (5 mg total) by mouth at bedtime as needed. 30 tablet 0   montelukast (SINGULAIR) 10 MG tablet Take by mouth.     Multiple Vitamin (MULTIVITAMIN WITH MINERALS) TABS tablet Take 1 tablet by mouth daily.     naproxen (NAPROSYN) 500 MG tablet Take 500 mg by mouth 2 (two) times daily. (Patient not taking: Reported on 07/17/2022)     Naproxen Sodium 220 MG CAPS Take by mouth.     pantoprazole (PROTONIX) 40 MG tablet Take 1 tablet (40 mg total) by mouth daily. 30 tablet 5   SYMBICORT 160-4.5 MCG/ACT inhaler INHALE 2 PUFFS INTO THE LUNGS TWICE DAILY (Patient taking differently: Inhale 2 puffs into the lungs in the morning and at bedtime.) 10.2 g 5   TEZSPIRE 210 MG/1. SOAJ Inject into the skin.     triamcinolone (NASACORT) 55 MCG/ACT AERO nasal inhaler Place 1 spray into the nose 2 (two) times daily as needed (nasal symptoms). 1 each 5   valACYclovir (VALTREX) 1000 MG tablet Take 1,000 mg by mouth daily.     No current facility-administered medications for this visit.    PHYSICAL EXAMINATION: ECOG PERFORMANCE STATUS: 0 - Asymptomatic  Vitals:   08/04/22 1541  BP: 133/88  Pulse: 76  Resp: 18  Temp: 98.8 F (37.1 C)  SpO2: 100%   Wt Readings from Last 3 Encounters:   08/04/22 174 lb (78.9 kg)  07/17/22 174 lb (78.9 kg)  05/26/22 170 lb 3.2 oz (77.2 kg)     GENERAL:alert, no distress and comfortable SKIN: skin color, texture, turgor are normal, no rashes or significant lesions except skin tightness in left lower extremities especially in her feet EYES: normal, Conjunctiva are pink and non-injected, sclera clear NECK: supple, thyroid normal size, non-tender, without nodularity LYMPH:  no palpable lymphadenopathy in the cervical, axillary  LUNGS: clear to auscultation and percussion with normal breathing effort HEART: regular rate & rhythm and no murmurs and no lower extremity edema ABDOMEN:abdomen soft, non-tender and normal bowel sounds Musculoskeletal:no cyanosis of digits and no clubbing  NEURO: alert & oriented x 3 with fluent speech, no focal motor/sensory deficits  LABORATORY DATA:  I have reviewed the data as listed    Latest Ref Rng & Units 08/04/2022    3:29 PM 02/02/2022    2:55 PM 12/14/2021    9:38 AM  CBC  WBC 4.0 - 10.5 K/uL 2.6  2.8  3.1   Hemoglobin 12.0 -  15.0 g/dL 96.0  9.8  7.6   Hematocrit 36.0 - 46.0 % 33.5  29.9  23.2   Platelets 150 - 400 K/uL 120  145  182         Latest Ref Rng & Units 12/18/2021    5:49 AM 12/13/2021    5:20 AM 12/12/2021    3:39 AM  CMP  Glucose 70 - 99 mg/dL 90  96  99   BUN 6 - 20 mg/dL 19  30  38   Creatinine 0.44 - 1.00 mg/dL 4.54  0.98  1.19   Sodium 135 - 145 mmol/L 141  140  137   Potassium 3.5 - 5.1 mmol/L 4.0  3.9  4.0   Chloride 98 - 111 mmol/L 112  109  110   CO2 22 - 32 mmol/L Calcium 8.9 - 10.3 mg/dL 8.6  8.3  8.1   Total Protein 6.5 - 8.1 g/dL  6.2  6.2   Total Bilirubin 0.3 - 1.2 mg/dL  1.3  1.4   Alkaline Phos 38 - 126 U/L  118  119   AST 15 - 41 U/L  144  240   ALT 0 - 44 U/L  237  313       RADIOGRAPHIC STUDIES: I have personally reviewed the radiological images as listed and agreed with the findings in the report. No results found.    No orders of the  defined types were placed in this encounter.  All questions were answered. The patient knows to call the clinic with any problems, questions or concerns. No barriers to learning was detected. The total time spent in the appointment was 30 minutes.     Malachy Mood, MD 08/04/2022

## 2022-08-08 ENCOUNTER — Other Ambulatory Visit: Payer: Self-pay

## 2022-08-08 DIAGNOSIS — R600 Localized edema: Secondary | ICD-10-CM

## 2022-08-08 DIAGNOSIS — D696 Thrombocytopenia, unspecified: Secondary | ICD-10-CM

## 2022-08-09 ENCOUNTER — Ambulatory Visit: Payer: BC Managed Care – PPO | Admitting: Neurology

## 2022-08-09 ENCOUNTER — Ambulatory Visit (HOSPITAL_COMMUNITY)
Admission: RE | Admit: 2022-08-09 | Discharge: 2022-08-09 | Disposition: A | Discharge status: Home / Self Care | Payer: BC Managed Care – PPO | Source: Ambulatory Visit | Attending: Internal Medicine | Admitting: Internal Medicine

## 2022-08-09 ENCOUNTER — Encounter: Payer: Self-pay | Admitting: Physical Therapy

## 2022-08-09 ENCOUNTER — Ambulatory Visit: Payer: BC Managed Care – PPO | Admitting: Physical Therapy

## 2022-08-09 ENCOUNTER — Encounter: Payer: Self-pay | Admitting: Neurology

## 2022-08-09 VITALS — BP 122/69 | HR 63 | Ht 62.0 in | Wt 172.0 lb

## 2022-08-09 DIAGNOSIS — M5416 Radiculopathy, lumbar region: Secondary | ICD-10-CM

## 2022-08-09 DIAGNOSIS — G509 Disorder of trigeminal nerve, unspecified: Secondary | ICD-10-CM

## 2022-08-09 DIAGNOSIS — M5459 Other low back pain: Secondary | ICD-10-CM

## 2022-08-09 DIAGNOSIS — R202 Paresthesia of skin: Secondary | ICD-10-CM | POA: Diagnosis not present

## 2022-08-09 DIAGNOSIS — D696 Thrombocytopenia, unspecified: Secondary | ICD-10-CM | POA: Diagnosis present

## 2022-08-09 DIAGNOSIS — G629 Polyneuropathy, unspecified: Secondary | ICD-10-CM

## 2022-08-09 DIAGNOSIS — R600 Localized edema: Secondary | ICD-10-CM | POA: Diagnosis present

## 2022-08-09 DIAGNOSIS — M25552 Pain in left hip: Secondary | ICD-10-CM

## 2022-08-09 NOTE — Progress Notes (Signed)
Follow-up Visit   Date: 08/09/22   Megan Reid MRN: [id number] DOB: 1965/12/18   Interim History: Megan Reid is a 57 y.o. right-handed female with asthma, Sjogren's disease, cytopenia, and lumbar canal stenosis  returning to the clinic with complaints of right facial pain and feet paresthesias.  The patient was accompanied to the clinic by self.  IMPRESSION/PLAN: Lumbar canal stenosis at L5-S1 causing bilateral feet paresthesias. NCS/EMG of the legs shows chronic L5 radiculopathy, no evidence of neuropathy   - Continue physical therapy for low back strengthening - Continue gabapentin 100mg  at bedtime as needed  2.  Right trigeminal nerve pain secondary to localized nerve irritation due to proximity of the parotid gland secondary to Sjogren's disease.  Pain has overall significantly improved. She continues to have hyperesthesia over the right side of the face.  Previously on carbamazepine 200mg  daily which was stopped due to hospitalization of acute hepatitis, acute renal failure, and encephaloathy  - Continue gabapentin 100mg  at bedtime as needed  Return to clinic in 6 months -------------------------------------------------------------- History of present illness:  In June 2023, she began having right side ear pain and diagnosed with swimmer's ear. She has numbness over the right side over the cheek, jaw, upper and lower lips. No pain involving the nose or forehead.  She complains of skin hypersensitivity on the right cheek.  She denies shooting pain over the right side of the face. She was treated with antibiotics which would temporarily relieve her pain and discomfort.  She saw ENT who ordered CT which showed heterogeneity of the parotid and submandibular glands, consistent with Sjogren's syndrome.  She has been treated with several cycles of antibiotics and steroids, which resolves symptoms when she is taking it, but once the course has been completed, her right parotid  fullness and facial pain returned.   UPDATE 04/28/2022:  At her last visit in August, she was started on carbamazpine for her facial pain.  A month later, she was hospitalized with acute hepatitis, encephalopathy, and pancytopenia thought to be carbamazepine-induced.  She was briefly in the ICU and treated supportively after which she was discharged to rehab. She is here because her bilateral feet tingling and pain has became worse since her hospitalization. She also continues to have low back pain.  She was given gabapentin 100mg  at bedtime, but has not started this until she was seen by me. She continues to have right jaw sensitivity.    UPDATE 08/09/2022:  She started PT which has helped with feet sensation and low back pain.  Right facial pain improved so now she takes gabapentin 100mg  only as needed, usually 2-3 times per week. Overall, she has been doing better than before.   Recently, she has been having left lower leg pain, soreness, and tenderness.  She is scheduled to have US of the leg to evaluate for DVT.    Medications:  Current Outpatient Medications on File Prior to Visit  Medication Sig Dispense Refill   albuterol (PROVENTIL HFA;VENTOLIN HFA) 108 (90 Base) MCG/ACT inhaler Inhale 2 puffs into the lungs 2 (two) times daily. For shortness of breath 18 g 0   fluticasone-salmeterol (ADVAIR) 100-50 MCG/ACT AEPB Inhale into the lungs.     gabapentin (NEURONTIN) 100 MG capsule Take 1 capsule (100 mg total) by mouth at bedtime. 30 capsule 5   hydrochlorothiazide (HYDRODIURIL) 12.5 MG tablet Take 12.5 mg by mouth every morning.     hydrOXYzine (ATARAX) 10 MG tablet Take 1 tablet (10 mg  total) by mouth 3 (three) times daily as needed for anxiety. 30 tablet 0   melatonin 5 MG TABS Take 1 tablet (5 mg total) by mouth at bedtime as needed. 30 tablet 0   montelukast (SINGULAIR) 10 MG tablet Take by mouth.     Multiple Vitamin (MULTIVITAMIN WITH MINERALS) TABS tablet Take 1 tablet by mouth daily.      pantoprazole (PROTONIX) 40 MG tablet Take 1 tablet (40 mg total) by mouth daily. 30 tablet 5   SYMBICORT 160-4.5 MCG/ACT inhaler INHALE 2 PUFFS INTO THE LUNGS TWICE DAILY (Patient taking differently: Inhale 2 puffs into the lungs in the morning and at bedtime.) 10.2 g 5   TEZSPIRE 210 MG/1. SOAJ Inject into the skin.     triamcinolone (NASACORT) 55 MCG/ACT AERO nasal inhaler Place 1 spray into the nose 2 (two) times daily as needed (nasal symptoms). 1 each 5   valACYclovir (VALTREX) 1000 MG tablet Take 1,000 mg by mouth daily.     busPIRone (BUSPAR) 5 MG tablet Take 1 tablet (5 mg total) by mouth 2 (two) times daily. (Patient not taking: Reported on 08/09/2022) 60 tablet 3   chlorpheniramine-HYDROcodone (TUSSIONEX) 10-8 MG/5ML Take by mouth. (Patient not taking: Reported on 08/09/2022)     fluticasone (FLONASE) 50 MCG/ACT nasal spray 2 sprays. (Patient not taking: Reported on 08/09/2022)     levalbuterol (XOPENEX HFA) 45 MCG/ACT inhaler Inhale into the lungs. (Patient not taking: Reported on 08/09/2022)     naproxen (NAPROSYN) 500 MG tablet Take 500 mg by mouth 2 (two) times daily. (Patient not taking: Reported on 07/17/2022)     Naproxen Sodium 220 MG CAPS Take by mouth. (Patient not taking: Reported on 08/09/2022)     No current facility-administered medications on file prior to visit.    Allergies:  Allergies  Allergen Reactions   Misc. Sulfonamide Containing Compounds Anaphylaxis, Hives, Rash and Swelling    Swelling of the face and tongue , Swelling of the face and tongue   Strawberry Extract Other (See Comments), Itching and Rash    Unknown reaction  Unknown reaction, Unknown reaction, Unknown reaction   Sulfa Antibiotics Anaphylaxis, Hives and Swelling    Swelling of the face and tongue    Tegretol [Carbamazepine] Other (See Comments)    Suspected hepatotoxicity and pancytopenia, required inpatient admission 11/2021    Vital Signs:  BP 122/69   Pulse 63   Ht  (1.575 m)   Wt  172 lb (78 kg)   LMP 05/22/2012   SpO2 99%   BMI 31.46 kg/m   Neurological Exam: MENTAL STATUS including orientation to time, place, person, recent and remote memory, attention span and concentration, language, and fund of knowledge is normal.  Speech is not dysarthric.  CRANIAL NERVES:  Pupils equal round and reactive to light.  Normal conjugate, extra-ocular eye movements in all directions of gaze.  No ptosis.  Hyperesthesia to light touch, temperature, and pin prick over the right V3 distribution.  There is asymmetric fullness of the right parotid gland as compared to the left.  Palate elevates symmetrically.  Tongue is midline.  MOTOR:  Motor strength is 5/5 in all extremities.  No atrophy, fasciculations or abnormal movements.  No pronator drift.  Tone is normal.    MSRs:  Reflexes are 2+/4 throughout, except 3+/4 at bilateral patellas  SENSORY:  Intact to vibration throughout.  COORDINATION/GAIT:  Normal finger-to- nose-finger. Gait narrow based and stable.   Data: MRI lumbar spine wo contrast 04/08/2021: No  change demonstrated since the study of 2015. Markedly exaggerated lumbosacral lordosis. Chronic degenerative anterolisthesis at L5-S1 of 8 mm. Note that lumbosacral anatomy is transitional, and the same numbering terminology was used as on the previous examination.   L3-4: Disc bulge. Mild facet and ligamentous hypertrophy. Mild lateral recess narrowing without visible neural compression.   L4-5: Disc bulge. Facet and ligamentous hypertrophy. Mild stenosis of both lateral recesses but without visible neural compression.   L5-S1: Chronic degenerative anterolisthesis of 8 mm. Chronic disc degeneration with pseudo disc herniation. Stenosis of the canal and neural foramina which could possibly cause neural compression.  CT soft tissue neck w contrast 11/08/2021: 1. Heterogeneity of the parotid and submandibular glands, consistent with the patient's diagnosis of Sjogren  syndrome, without sialolithiasis or evidence of inflammatory changes or ductal dilatation to suggest sialadenitis. 2. 1.5 cm hypoenhancing nodule in the left thyroid lobe, previously evaluated with ultrasound on 05/12/2021.  NCS/EMG of the legs 05/05/2021: Chronic L5 radiculopathy affecting the right lower extremity, mild-to-moderate.  There has been mild improvement as compared to prior study on 04/24/2017.   In particular, there is no evidence of a sensorimotor polyneuropathy affecting the lower extremities.   Total time spent reviewing records, interview, history/exam, documentation, and coordination of care on day of encounter:  20 min    Thank you for allowing me to participate in patient's care.  If I can answer any additional questions, I would be pleased to do so.    Sincerely,    Samuel Rittenhouse K. Allena Katz, DO

## 2022-08-09 NOTE — Therapy (Signed)
OUTPATIENT PHYSICAL THERAPY NEURO EVALUATION   Patient Name: Megan Reid MRN: [id number] DOB:[birth year], 57 y.o., female Today's Date: 08/09/2022   PCP: Megan Reid  REFERRING PROVIDER: Angelina Sheriff, DO    PT End of Session - 08/09/22 1748     Visit Number 2    Date for PT Re-Evaluation 10/26/22    Authorization Type BC[employer]    PT Start Time 1745    PT Stop Time 1840    PT Time Calculation (min) 55 min    Activity Tolerance Patient tolerated treatment well    Behavior During Therapy [facility] for tasks assessed/performed              Past Medical History:  Diagnosis Date   Arthralgia 02/14/2016   Asthma    Autoimmune disease 02/14/2016   Positive RF 139 CCP Negative  Positive ANA 1:640 Positive Ro Positive [place] Elevated ESR 105 - Sjorgen's Disease   DDD (degenerative disc disease), lumbar 02/14/2016   Fatigue 02/14/2016   Gastritis 02/14/2016   Hot flashes    Migraines 02/14/2016   Osteoarthritis of both hands 02/14/2016   Osteoarthritis of both knees 02/14/2016   Vitamin D deficiency 02/14/2016   Past Surgical History:  Procedure Laterality Date   BREAST BIOPSY Left    ROTATOR CUFF REPAIR Right 06/16/2019   TUBAL LIGATION     Patient Active Problem List   Diagnosis Date Noted   Greater trochanteric bursitis of left hip 07/23/2022   Snapping hip syndrome, 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   Lumbosacral radiculopathy at L5 05/29/2022   Anxiety state 05/29/2022   Gastroesophageal reflux disease 03/08/2022   Decreased mobility and endurance 01/25/2022   Difficulty sleeping 01/25/2022   Dairy product intolerance 01/25/2022   Mild cognitive impairment 01/25/2022   Paresthesia of left leg 01/25/2022   Anemia 12/20/2021   Abnormal LFTs 12/20/2021   Toxic metabolic encephalopathy 12/12/2021   Thrombocytopenia 12/04/2021   Seasonal and perennial allergic rhinitis 12/02/2020   Chronic coughing 12/02/2020   Sjogren's syndrome 12/02/2020   Leukopenia 12/02/2020    Adverse reaction to food, subsequent encounter 06/23/2020   Heartburn 06/23/2020   Bruising 06/23/2020   Abrasion of right ankle 02/13/2017   Rheumatoid factor positive 09/01/2016   Leucopenia 09/01/2016   Autoimmune disease 02/14/2016   Fatigue 02/14/2016   DDD (degenerative disc disease), lumbar 02/14/2016   Osteoarthritis of both knees 02/14/2016   Osteoarthritis of both hands 02/14/2016   Asthma 02/14/2016   Migraines 02/14/2016   Gastritis 02/14/2016   Vitamin D deficiency 02/14/2016   Sjogren's syndrome with keratoconjunctivitis sicca 02/14/2016   High risk medication use 02/14/2016    ONSET DATE: 12/21/2021   REFERRING DIAG: left hip pain and lumbar stenosis  THERAPY DIAG:  Other low back pain  Pain in left hip  Neuropathy  Rationale for Evaluation and Treatment Rehabilitation  SUBJECTIVE:  SUBJECTIVE STATEMENT: Patient reports that she has been doing well but having some increased pain the past day or so.  Pain in the hip 4/10.  Thought the ionto helped  Pt accompanied by: self  PERTINENT HISTORY: Brief HPI:   Megan Reid is a 57 y.o. female with history of DDD lumbar spine, asthma, autoimmune disease, Sjogren's with recent onset of trigeminal neuralgia and was started on Tegretol.  She was also reported to have been taking ibuprofen as well as Tylenol for pain management.  She was found to have metabolic encephalopathy with acute hepatic and renal failure.  Renal failure treated with IV fluids and bicarb .  AKI felt to be due to ATN from acute illness, diarrhea and ibuprofen use.  She developed fever past admission and was treated with IV antibiotics due to concerns of sepsis.  Work-up was negative for DR ESS, tickborne illness, CMV, EBV and acute hepatitis/pancytopenia felt to  be secondary to Tegretol toxicity.  She did not have pancytopenia and supportive care was recommended by heme-onc.  Renal status and abnormal LFTs were resolving however patient continued limited by weakness, DOE as well as encephalopathy with delayed processing.  CIR was recommended due to functional decline.     Hospital Course: Megan Reid was admitted to rehab 12/12/2021 for inpatient therapies to consist of PT, ST and OT at least three hours five days a week. Past admission physiatrist, therapy team and rehab RN have worked together to provide customized collaborative inpatient rehab.  Her blood pressures were monitored on TID basis and has been stable.  Follow-up check of electrolytes revealed renal status as well as abnormal LFTs to be gradually improving.  She was found to have drop in magnesium to 1.3 requiring IV supplementation as well as p.o. supplement.  Repeat check showed improvement with magnesium of 2.0.  Recommend repeat check in 1 to 2 weeks to monitor for stability as well as need to continue oral supplementation.   Follow-up CBC showed improvement in white count with stable H&H and that thrombocytopenia had resolved.  Her alertness and mentation has gradually improved with improvement in activity tolerance and respiratory status.  Hydroxyzine 10 mg 3 times daily as needed was added to help manage anxiety.  Sleep-wake disruption is improved with addition of melatonin.  Constipation has resolved with augmentation of bowel regimen.  She has made steady gains during her rehab stay and is modified independent at discharge.  Supervision is recommended with cognitive task and for safety.  She will continue to receive follow-up outpatient PT, OT and ST at [facility] outpatient rehab after discharge.     Rehab course: During patient's stay in rehab weekly team conferences were held to monitor patient's progress, set goals and discuss barriers to discharge. At admission, patient required min  assist with mobility and with ADL tasks.  She exhibited mild cognitive deficits affecting recall, memory and executive function with SLUMS score 22/30. She  has had improvement in activity tolerance, balance, postural control as well as ability to compensate for deficits. She is able to complete ADL tasks at modified independent level. She is independent for transfers and is able to ambulate 150' without AD. She is able to climb 12 stairs independently. She requires supervision with functional and complex cognitive tasks. Her SLUMS score has improved to 26/30. Family education has been completed.    Disposition: Home  PAIN:  Are you having pain? Yes, pain in the left hip a 6/10, back pain is  a 5/10 Aggravating:  sit, lie down pain can increase Relieving:  stretch helps, reports that she stopped the medication due to wanting to not be on it. She also is having an increase pain in the left calf, she had a negative Homan's test, no temperature  PRECAUTIONS: Fall  WEIGHT BEARING RESTRICTIONS No  FALLS: Has patient fallen in last 6 months? No falls LIVING ENVIRONMENT: Lives with: lives with their spouse Lives in: House/apartment Stairs: 4 STE B rails, home is handicap accessible except for bathtub  Has following equipment at home: None  PLOF: Independent and Independent with basic ADLs  Teacher special education  PATIENT GOALS build strength and balance, have less pain  OBJECTIVE:   DIAGNOSTIC FINDINGS:  lumbar stenosis  COGNITION: WNL    SENSATION: Not tested  MUSCLE LENGTH:  tight HS, calf, piriformis SLR 45 degrees with pain, very tight and tender in the ITB  POSTURE: decreased lordosis   LUMBAR ROM: decreased 50% with pain  LOWER EXTREMITY ROM:     Active  Right Eval Left Eval  Hip flexion    Hip extension 10 10  Hip abduction 15 15  Hip adduction    Hip internal rotation    Hip external rotation    Knee flexion    Knee extension    Ankle dorsiflexion    Ankle  plantarflexion    Ankle inversion    Ankle eversion     (Blank rows = not tested)  LOWER EXTREMITY MMT:    MMT Right Eval Left Eval  Hip flexion 4- 3+  Hip extension 3+ 3+  Hip abduction 3+ 3+ P!  Hip adduction    Hip internal rotation    Hip external rotation    Knee flexion 4 4  Knee extension 4 4  Ankle dorsiflexion 3 3  Ankle plantarflexion    Ankle inversion    Ankle eversion    (Blank rows = not tested)  TRANSFERS: Assistive device utilized: None  Sit to stand: Complete Independence Stand to sit: Complete Independence Chair to chair: Complete Independence Floor:   STAIRS: does one at a time due to pain    GAIT: Gait pattern: WFL and trendelenburg left Distance walked: 100 feet Assistive device utilized: None Level of assistance: Complete Independence Comments: sore and tired   FUNCTIONAL TESTs:  TUG 16 seconds 5XSTS 22 seconds with pain PATIENT SURVEYS:  FOTO = 44  TODAY'S TREATMENT:  08/09/22 Nustep level 5 x 6 minutes Leg press 20# Passive ROM LE's  On airex 10# straight arm pulls 5# hip extension had to do sets of 5 due to fatigue and weakness Feet on ball K2C, trunk rotation, bridge and iso abs Static lumbar traction 65# Ionto left GT area 80mA doese  07/27/22   Ionto 80mA dose left GT area    PATIENT EDUCATION: Education details: exam findings, POC, HEP  Person educated: Patient Education method: Explanation, Demonstration, and Handouts Education comprehension: verbalized understanding and returned demonstration   HOME EXERCISE PROGRAM: Access Code: [email or web address] URL: [email or web address] Date: 07/27/2022 Prepared by: Stacie Glaze  Exercises - Seated Hamstring Stretch with Chair  - 2 x daily - 7 x weekly - 1 sets - 5 reps - 30 hold - Supine Piriformis Stretch Pulling Heel to Hip  - 2 x daily - 7 x weekly - 1 sets - 5 reps - 30 hold - Supine ITB Stretch with Strap  - 2 x daily - 7 x weekly - 1 sets - 5 reps - 30  hold - Supine Lower Trunk Rotation  - 2 x daily - 7 x weekly - 1 sets - 10 reps - 10 hold - Hooklying Single Knee to Chest  - 2 x daily - 7 x weekly - 1 sets - 10 reps - 10 hold    GOALS: Goals reviewed with patient? Yes  SHORT TERM GOALS: Target date: 08/18/22  Will be compliant with appropriate progressive HEP  Goal status: met 08/09/22   LONG TERM GOALS: Target date: 10/26/22  MMT to improve by at least 1 grade in all weak groups  Goal status: INITIAL  2.  Decrease pain 50% Goal status: INITIAL  3. Increase lumbar ROM 25% Goal status: INITIAL  4.  Will be independent with appropriate gym based exercise program to maintain functional gains and continue progress on an independent basis  Goal status: INITIAL   5:  go up and down stairs step over step ASSESSMENT:  CLINICAL IMPRESSION: Progressed some strength, flexibility, she felt that the ionto helped last time so repeated this and added traction.  She fatigued easily with the hip extension exercises and did not attempt the hip abduction due to pain I the GT area  OBJECTIVE IMPAIRMENTS cardiopulmonary status limiting activity, decreased activity tolerance, decreased balance, decreased cognition, decreased mobility, difficulty walking, decreased strength, and improper body mechanics.   REHAB POTENTIAL: Good  CLINICAL DECISION MAKING: Stable/uncomplicated  EVALUATION COMPLEXITY: Low  PLAN: PT FREQUENCY: 1-2x/week  PT DURATION: 12 weeks  PLANNED INTERVENTIONS: Therapeutic exercises, Therapeutic activity, Neuromuscular re-education, Balance training, Gait training, Patient/Family education, Self Care, Joint mobilization, Stair training, Dry Needling, Cognitive remediation, Electrical stimulation, Cryotherapy, Moist heat, Taping, Ultrasound, Ionotophoresis 4mg /ml Dexamethasone, and Manual therapy  PLAN FOR NEXT SESSION: ionto, traction, flexibility and stability  Micahel Eliab Closson, PT 08/09/2022, 6:18 PM

## 2022-08-09 NOTE — Patient Instructions (Signed)
Continue physical therapy  You may take gabapentin  at bedtime as needed  I will see you back in 6 months

## 2022-08-17 ENCOUNTER — Ambulatory Visit: Payer: BC Managed Care – PPO | Attending: Physical Medicine and Rehabilitation | Admitting: Physical Therapy

## 2022-08-17 DIAGNOSIS — M5459 Other low back pain: Secondary | ICD-10-CM | POA: Insufficient documentation

## 2022-08-17 DIAGNOSIS — M25552 Pain in left hip: Secondary | ICD-10-CM | POA: Insufficient documentation

## 2022-08-17 DIAGNOSIS — G629 Polyneuropathy, unspecified: Secondary | ICD-10-CM | POA: Insufficient documentation

## 2022-08-17 DIAGNOSIS — M6281 Muscle weakness (generalized): Secondary | ICD-10-CM | POA: Insufficient documentation

## 2022-08-22 ENCOUNTER — Encounter: Payer: Self-pay | Admitting: Physical Therapy

## 2022-08-22 ENCOUNTER — Ambulatory Visit: Payer: BC Managed Care – PPO | Admitting: Physical Therapy

## 2022-08-22 DIAGNOSIS — M6281 Muscle weakness (generalized): Secondary | ICD-10-CM | POA: Diagnosis present

## 2022-08-22 DIAGNOSIS — G629 Polyneuropathy, unspecified: Secondary | ICD-10-CM | POA: Diagnosis present

## 2022-08-22 DIAGNOSIS — M5459 Other low back pain: Secondary | ICD-10-CM | POA: Diagnosis present

## 2022-08-22 DIAGNOSIS — M25552 Pain in left hip: Secondary | ICD-10-CM

## 2022-08-22 NOTE — Therapy (Signed)
OUTPATIENT PHYSICAL THERAPY NEURO TREATMENT   Patient Name: Megan Reid MRN: [id number] DOB:[birth year], 57 y.o., female Today's Date: 08/22/2022   PCP: [name]  REFERRING PROVIDER: Angelina Sheriff, DO    PT End of Session - 08/22/22 1708     Visit Number 3    Date for PT Re-Evaluation 10/26/22    Authorization Type BCBS    PT Start Time 1700    PT Stop Time 1800    PT Time Calculation (min) 60 min    Activity Tolerance Patient tolerated treatment well    Behavior During Therapy [facility] for tasks assessed/performed              Past Medical History:  Diagnosis Date   Arthralgia 02/14/2016   Asthma    Autoimmune disease (HCC) 02/14/2016   Positive RF 139 CCP Negative  Positive ANA 1:640 Positive Ro Positive La Elevated ESR 105 - Sjorgen's Disease   DDD (degenerative disc disease), lumbar 02/14/2016   Fatigue 02/14/2016   Gastritis 02/14/2016   Hot flashes    Migraines 02/14/2016   Osteoarthritis of both hands 02/14/2016   Osteoarthritis of both knees 02/14/2016   Vitamin D deficiency 02/14/2016   Past Surgical History:  Procedure Laterality Date   BREAST BIOPSY Left    ROTATOR CUFF REPAIR Right 06/16/2019   TUBAL LIGATION     Patient Active Problem List   Diagnosis Date Noted   Greater trochanteric bursitis of left hip 07/23/2022   Snapping hip syndrome, 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   Lumbosacral radiculopathy at L5 05/29/2022   Anxiety state 05/29/2022   Gastroesophageal reflux disease 03/08/2022   Decreased mobility and endurance 01/25/2022   Difficulty sleeping 01/25/2022   Dairy product intolerance 01/25/2022   Mild cognitive impairment 01/25/2022   Paresthesia of left leg 01/25/2022   Anemia 12/20/2021   Abnormal LFTs 12/20/2021   Toxic metabolic encephalopathy 12/12/2021   Thrombocytopenia (HCC) 12/04/2021   Seasonal and perennial allergic rhinitis 12/02/2020   Chronic coughing 12/02/2020   Sjogren's syndrome (HCC) 12/02/2020   Leukopenia  12/02/2020   Adverse reaction to food, subsequent encounter 06/23/2020   Heartburn 06/23/2020   Bruising 06/23/2020   Abrasion of right ankle 02/13/2017   Rheumatoid factor positive 09/01/2016   Leucopenia 09/01/2016   Autoimmune disease (HCC) 02/14/2016   Fatigue 02/14/2016   DDD (degenerative disc disease), lumbar 02/14/2016   Osteoarthritis of both knees 02/14/2016   Osteoarthritis of both hands 02/14/2016   Asthma 02/14/2016   Migraines 02/14/2016   Gastritis 02/14/2016   Vitamin D deficiency 02/14/2016   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HCC) 02/14/2016   High risk medication use 02/14/2016    ONSET DATE: 12/21/2021   REFERRING DIAG: left hip pain and lumbar stenosis  THERAPY DIAG:  Other low back pain  Pain in left hip  Neuropathy  Muscle weakness (generalized)  Rationale for Evaluation and Treatment Rehabilitation  SUBJECTIVE:  SUBJECTIVE STATEMENT: Patient reports feeling pretty good.  Thought the ionto helped  Pt accompanied by: self  PERTINENT HISTORY: Brief HPI:   Megan Reid is a 57 y.o. female with history of DDD lumbar spine, asthma, autoimmune disease, Sjogren's with recent onset of trigeminal neuralgia and was started on Tegretol.  She was also reported to have been taking ibuprofen as well as Tylenol for pain management.  She was found to have metabolic encephalopathy with acute hepatic and renal failure.  Renal failure treated with IV fluids and bicarb .  AKI felt to be due to ATN from acute illness, diarrhea and ibuprofen use.  She developed fever past admission and was treated with IV antibiotics due to concerns of sepsis.  Work-up was negative for DR ESS, tickborne illness, CMV, EBV and acute hepatitis/pancytopenia felt to be secondary to Tegretol  toxicity.  She did not have pancytopenia and supportive care was recommended by heme-onc.  Renal status and abnormal LFTs were resolving however patient continued limited by weakness, DOE as well as encephalopathy with delayed processing.  CIR was recommended due to functional decline.     Hospital Course: Megan Reid was admitted to rehab 12/12/2021 for inpatient therapies to consist of PT, ST and OT at least three hours five days a week. Past admission physiatrist, therapy team and rehab RN have worked together to provide customized collaborative inpatient rehab.  Her blood pressures were monitored on TID basis and has been stable.  Follow-up check of electrolytes revealed renal status as well as abnormal LFTs to be gradually improving.  She was found to have drop in magnesium to 1.3 requiring IV supplementation as well as p.o. supplement.  Repeat check showed improvement with magnesium of 2.0.  Recommend repeat check in 1 to 2 weeks to monitor for stability as well as need to continue oral supplementation.   Follow-up CBC showed improvement in white count with stable H&H and that thrombocytopenia had resolved.  Her alertness and mentation has gradually improved with improvement in activity tolerance and respiratory status.  Hydroxyzine 10 mg 3 times daily as needed was added to help manage anxiety.  Sleep-wake disruption is improved with addition of melatonin.  Constipation has resolved with augmentation of bowel regimen.  She has made steady gains during her rehab stay and is modified independent at discharge.  Supervision is recommended with cognitive task and for safety.  She will continue to receive follow-up outpatient PT, OT and ST at [facility] outpatient rehab after discharge.     Rehab course: During patient's stay in rehab weekly team conferences were held to monitor patient's progress, set goals and discuss barriers to discharge. At admission, patient required min assist with mobility and  with ADL tasks.  She exhibited mild cognitive deficits affecting recall, memory and executive function with SLUMS score 22/30. She  has had improvement in activity tolerance, balance, postural control as well as ability to compensate for deficits. She is able to complete ADL tasks at modified independent level. She is independent for transfers and is able to ambulate 150' without AD. She is able to climb 12 stairs independently. She requires supervision with functional and complex cognitive tasks. Her SLUMS score has improved to 26/30. Family education has been completed.    Disposition: Home  PAIN:  Are you having pain? Yes, pain in the left hip a 6/10, back pain is a 5/10 Aggravating:  sit, lie down pain can increase Relieving:  stretch helps, reports that she stopped the  medication due to wanting to not be on it. She also is having an increase pain in the left calf, she had a negative Homan's test, no temperature  PRECAUTIONS: Fall  WEIGHT BEARING RESTRICTIONS No  FALLS: Has patient fallen in last 6 months? No falls LIVING ENVIRONMENT: Lives with: lives with their spouse Lives in: House/apartment Stairs: 4 STE B rails, home is handicap accessible except for bathtub  Has following equipment at home: None  PLOF: Independent and Independent with basic ADLs  Teacher special education  PATIENT GOALS build strength and balance, have less pain  OBJECTIVE:   DIAGNOSTIC FINDINGS:  lumbar stenosis  COGNITION: WNL    SENSATION: Not tested  MUSCLE LENGTH:  tight HS, calf, piriformis SLR 45 degrees with pain, very tight and tender in the ITB  POSTURE: decreased lordosis   LUMBAR ROM: decreased 50% with pain  LOWER EXTREMITY ROM:     Active  Right Eval Left Eval  Hip flexion    Hip extension 10 10  Hip abduction 15 15  Hip adduction    Hip internal rotation    Hip external rotation    Knee flexion    Knee extension    Ankle dorsiflexion    Ankle plantarflexion    Ankle  inversion    Ankle eversion     (Blank rows = not tested)  LOWER EXTREMITY MMT:    MMT Right Eval Left Eval  Hip flexion 4- 3+  Hip extension 3+ 3+  Hip abduction 3+ 3+ P!  Hip adduction    Hip internal rotation    Hip external rotation    Knee flexion 4 4  Knee extension 4 4  Ankle dorsiflexion 3 3  Ankle plantarflexion    Ankle inversion    Ankle eversion    (Blank rows = not tested)  TRANSFERS: Assistive device utilized: None  Sit to stand: Complete Independence Stand to sit: Complete Independence Chair to chair: Complete Independence Floor:   STAIRS: does one at a time due to pain    GAIT: Gait pattern: WFL and trendelenburg left Distance walked: 100 feet Assistive device utilized: None Level of assistance: Complete Independence Comments: sore and tired   FUNCTIONAL TESTs:  TUG 16 seconds 5XSTS 22 seconds with pain PATIENT SURVEYS:  FOTO = 44  TODAY'S TREATMENT:  08/22/22 Nustep level 5 x 6 minutes Calf stretches On airex 10# straight arm pulls Leg press 20# 2x10 25# leg curls 2x10 5# leg extension 2x10 5# hip abduction and extension x 10 each Feet on ball K2C, rotation, bridge and iso abs Passive stretch LE's Traction lumbar 60# static  08/09/22 Nustep level 5 x 6 minutes Leg press 20# Passive ROM LE's  On airex 10# straight arm pulls 5# hip extension had to do sets of 5 due to fatigue and weakness Feet on ball K2C, trunk rotation, bridge and iso abs Static lumbar traction 65# Ionto left GT area 80mA doese  07/27/22   Ionto 80mA dose left GT area    PATIENT EDUCATION: Education details: exam findings, POC, HEP  Person educated: Patient Education method: Explanation, Demonstration, and Handouts Education comprehension: verbalized understanding and returned demonstration   HOME EXERCISE PROGRAM: Access Code: [email or web address] URL: [email or web address] Date: 07/27/2022 Prepared by: Stacie Glaze  Exercises - Seated  Hamstring Stretch with Chair  - 2 x daily - 7 x weekly - 1 sets - 5 reps - 30 hold - Supine Piriformis Stretch Pulling Heel to Hip  - 2 x daily - 7  x weekly - 1 sets - 5 reps - 30 hold - Supine ITB Stretch with Strap  - 2 x daily - 7 x weekly - 1 sets - 5 reps - 30 hold - Supine Lower Trunk Rotation  - 2 x daily - 7 x weekly - 1 sets - 10 reps - 10 hold - Hooklying Single Knee to Chest  - 2 x daily - 7 x weekly - 1 sets - 10 reps - 10 hold    GOALS: Goals reviewed with patient? Yes  SHORT TERM GOALS: Target date: 08/18/22  Will be compliant with appropriate progressive HEP  Goal status: met 08/09/22   LONG TERM GOALS: Target date: 10/26/22  MMT to improve by at least 1 grade in all weak groups  Goal status: ongoing 08/22/22  2.  Decrease pain 50% Goal status: progressing 08/22/22  3. Increase lumbar ROM 25% Goal status: INITIAL  4.  Will be independent with appropriate gym based exercise program to maintain functional gains and continue progress on an independent basis  Goal status: INITIAL   5:  go up and down stairs step over step ASSESSMENT:  CLINICAL IMPRESSION: Doing well, less pain, she remains with tight LE mms, she also was very weak with the hip exercises, and the bridging, we may need to continue to work on this, overall she is having less pain today  OBJECTIVE IMPAIRMENTS cardiopulmonary status limiting activity, decreased activity tolerance, decreased balance, decreased cognition, decreased mobility, difficulty walking, decreased strength, and improper body mechanics.   REHAB POTENTIAL: Good  CLINICAL DECISION MAKING: Stable/uncomplicated  EVALUATION COMPLEXITY: Low  PLAN: PT FREQUENCY: 1-2x/week  PT DURATION: 12 weeks  PLANNED INTERVENTIONS: Therapeutic exercises, Therapeutic activity, Neuromuscular re-education, Balance training, Gait training, Patient/Family education, Self Care, Joint mobilization, Stair training, Dry Needling, Cognitive remediation,  Electrical stimulation, Cryotherapy, Moist heat, Taping, Ultrasound, Ionotophoresis 4mg /ml Dexamethasone, and Manual therapy  PLAN FOR NEXT SESSION: ionto, traction, flexibility and stability  Toney Sang, PT 08/22/2022, 6:19 PM

## 2022-08-24 ENCOUNTER — Encounter (HOSPITAL_COMMUNITY): Payer: BC Managed Care – PPO

## 2022-08-29 ENCOUNTER — Ambulatory Visit: Payer: BC Managed Care – PPO | Admitting: Physical Therapy

## 2022-08-29 ENCOUNTER — Encounter: Payer: Self-pay | Admitting: Physical Therapy

## 2022-08-29 DIAGNOSIS — M6281 Muscle weakness (generalized): Secondary | ICD-10-CM

## 2022-08-29 DIAGNOSIS — M5459 Other low back pain: Secondary | ICD-10-CM

## 2022-08-29 DIAGNOSIS — M25552 Pain in left hip: Secondary | ICD-10-CM

## 2022-08-29 NOTE — Therapy (Signed)
OUTPATIENT PHYSICAL THERAPY NEURO TREATMENT   Patient Name: Megan Reid MRN: [id number] DOB:[birth year], 57 y.o., female Today's Date: 08/29/2022   PCP: [name]  REFERRING PROVIDER: Angelina Sheriff, DO    PT End of Session - 08/29/22 1713     Visit Number 4    Date for PT Re-Evaluation 10/26/22    Authorization Type BCBS    PT Start Time 1708    PT Stop Time 1750    PT Time Calculation (min) 42 min    Activity Tolerance Patient tolerated treatment well    Behavior During Therapy WFL for tasks assessed/performed              Past Medical History:  Diagnosis Date   Arthralgia 02/14/2016   Asthma    Autoimmune disease (HCC) 02/14/2016   Positive RF 139 CCP Negative  Positive ANA 1:640 Positive Ro Positive La Elevated ESR 105 - Sjorgen's Disease   DDD (degenerative disc disease), lumbar 02/14/2016   Fatigue 02/14/2016   Gastritis 02/14/2016   Hot flashes    Migraines 02/14/2016   Osteoarthritis of both hands 02/14/2016   Osteoarthritis of both knees 02/14/2016   Vitamin D deficiency 02/14/2016   Past Surgical History:  Procedure Laterality Date   BREAST BIOPSY Left    ROTATOR CUFF REPAIR Right 06/16/2019   TUBAL LIGATION     Patient Active Problem List   Diagnosis Date Noted   Greater trochanteric bursitis of left hip 07/23/2022   Snapping hip syndrome, 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   Lumbosacral radiculopathy at L5 05/29/2022   Anxiety state 05/29/2022   Gastroesophageal reflux disease 03/08/2022   Decreased mobility and endurance 01/25/2022   Difficulty sleeping 01/25/2022   Dairy product intolerance 01/25/2022   Mild cognitive impairment 01/25/2022   Paresthesia of left leg 01/25/2022   Anemia 12/20/2021   Abnormal LFTs 12/20/2021   Toxic metabolic encephalopathy 12/12/2021   Thrombocytopenia (HCC) 12/04/2021   Seasonal and perennial allergic rhinitis 12/02/2020   Chronic coughing 12/02/2020   Sjogren's syndrome (HCC) 12/02/2020   Leukopenia  12/02/2020   Adverse reaction to food, subsequent encounter 06/23/2020   Heartburn 06/23/2020   Bruising 06/23/2020   Abrasion of right ankle 02/13/2017   Rheumatoid factor positive 09/01/2016   Leucopenia 09/01/2016   Autoimmune disease (HCC) 02/14/2016   Fatigue 02/14/2016   DDD (degenerative disc disease), lumbar 02/14/2016   Osteoarthritis of both knees 02/14/2016   Osteoarthritis of both hands 02/14/2016   Asthma 02/14/2016   Migraines 02/14/2016   Gastritis 02/14/2016   Vitamin D deficiency 02/14/2016   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HCC) 02/14/2016   High risk medication use 02/14/2016    ONSET DATE: 12/21/2021   REFERRING DIAG: left hip pain and lumbar stenosis  THERAPY DIAG:  Other low back pain  Pain in left hip  Muscle weakness (generalized)  Rationale for Evaluation and Treatment Rehabilitation  SUBJECTIVE:  SUBJECTIVE STATEMENT: Doing well.  I think it is helping.  Pt accompanied by: self  PERTINENT HISTORY: Brief HPI:   Megan Reid is a 57 y.o. female with history of DDD lumbar spine, asthma, autoimmune disease, Sjogren's with recent onset of trigeminal neuralgia and was started on Tegretol.  She was also reported to have been taking ibuprofen as well as Tylenol for pain management.  She was found to have metabolic encephalopathy with acute hepatic and renal failure.  Renal failure treated with IV fluids and bicarb .  AKI felt to be due to ATN from acute illness, diarrhea and ibuprofen use.  She developed fever past admission and was treated with IV antibiotics due to concerns of sepsis.  Work-up was negative for DR ESS, tickborne illness, CMV, EBV and acute hepatitis/pancytopenia felt to be secondary to Tegretol toxicity.  She did not have pancytopenia  and supportive care was recommended by heme-onc.  Renal status and abnormal LFTs were resolving however patient continued limited by weakness, DOE as well as encephalopathy with delayed processing.  CIR was recommended due to functional decline.     Hospital Course: Megan Reid was admitted to rehab 12/12/2021 for inpatient therapies to consist of PT, ST and OT at least three hours five days a week. Past admission physiatrist, therapy team and rehab RN have worked together to provide customized collaborative inpatient rehab.  Her blood pressures were monitored on TID basis and has been stable.  Follow-up check of electrolytes revealed renal status as well as abnormal LFTs to be gradually improving.  She was found to have drop in magnesium to 1.3 requiring IV supplementation as well as p.o. supplement.  Repeat check showed improvement with magnesium of 2.0.  Recommend repeat check in 1 to 2 weeks to monitor for stability as well as need to continue oral supplementation.   Follow-up CBC showed improvement in white count with stable H&H and that thrombocytopenia had resolved.  Her alertness and mentation has gradually improved with improvement in activity tolerance and respiratory status.  Hydroxyzine 10 mg 3 times daily as needed was added to help manage anxiety.  Sleep-wake disruption is improved with addition of melatonin.  Constipation has resolved with augmentation of bowel regimen.  She has made steady gains during her rehab stay and is modified independent at discharge.  Supervision is recommended with cognitive task and for safety.  She will continue to receive follow-up outpatient PT, OT and ST at [facility] outpatient rehab after discharge.     Rehab course: During patient's stay in rehab weekly team conferences were held to monitor patient's progress, set goals and discuss barriers to discharge. At admission, patient required min assist with mobility and with ADL tasks.  She exhibited mild  cognitive deficits affecting recall, memory and executive function with SLUMS score 22/30. She  has had improvement in activity tolerance, balance, postural control as well as ability to compensate for deficits. She is able to complete ADL tasks at modified independent level. She is independent for transfers and is able to ambulate 150' without AD. She is able to climb 12 stairs independently. She requires supervision with functional and complex cognitive tasks. Her SLUMS score has improved to 26/30. Family education has been completed.    Disposition: Home  PAIN:  Are you having pain? Yes, pain in the left hip a 6/10, back pain is a 5/10 Aggravating:  sit, lie down pain can increase Relieving:  stretch helps, reports that she stopped the medication due  to wanting to not be on it. She also is having an increase pain in the left calf, she had a negative Homan's test, no temperature  PRECAUTIONS: Fall  WEIGHT BEARING RESTRICTIONS No  FALLS: Has patient fallen in last 6 months? No falls LIVING ENVIRONMENT: Lives with: lives with their spouse Lives in: House/apartment Stairs: 4 STE B rails, home is handicap accessible except for bathtub  Has following equipment at home: None  PLOF: Independent and Independent with basic ADLs  Teacher special education  PATIENT GOALS build strength and balance, have less pain  OBJECTIVE:   DIAGNOSTIC FINDINGS:  lumbar stenosis  COGNITION: WNL    SENSATION: Not tested  MUSCLE LENGTH:  tight HS, calf, piriformis SLR 45 degrees with pain, very tight and tender in the ITB  POSTURE: decreased lordosis   LUMBAR ROM: decreased 50% with pain  LOWER EXTREMITY ROM:     Active  Right Eval Left Eval  Hip flexion    Hip extension 10 10  Hip abduction 15 15  Hip adduction    Hip internal rotation    Hip external rotation    Knee flexion    Knee extension    Ankle dorsiflexion    Ankle plantarflexion    Ankle inversion    Ankle eversion      (Blank rows = not tested)  LOWER EXTREMITY MMT:    MMT Right Eval Left Eval  Hip flexion 4- 3+  Hip extension 3+ 3+  Hip abduction 3+ 3+ P!  Hip adduction    Hip internal rotation    Hip external rotation    Knee flexion 4 4  Knee extension 4 4  Ankle dorsiflexion 3 3  Ankle plantarflexion    Ankle inversion    Ankle eversion    (Blank rows = not tested)  TRANSFERS: Assistive device utilized: None  Sit to stand: Complete Independence Stand to sit: Complete Independence Chair to chair: Complete Independence Floor:   STAIRS: does one at a time due to pain    GAIT: Gait pattern: WFL and trendelenburg left Distance walked: 100 feet Assistive device utilized: None Level of assistance: Complete Independence Comments: sore and tired   FUNCTIONAL TESTs:  TUG 16 seconds 5XSTS 22 seconds with pain PATIENT SURVEYS:  FOTO = 44  TODAY'S TREATMENT:  08/29/22 Nustep level 5 x 6 minutes 25# leg curls 5# leg extension Leg press 20# 2x10 20# rows 20# lats STM to the left buttock and left ITB  08/22/22 Nustep level 5 x 6 minutes Calf stretches On airex 10# straight arm pulls Leg press 20# 2x10 25# leg curls 2x10 5# leg extension 2x10 5# hip abduction and extension x 10 each Feet on ball K2C, rotation, bridge and iso abs Passive stretch LE's Traction lumbar 60# static  08/09/22 Nustep level 5 x 6 minutes Leg press 20# Passive ROM LE's  On airex 10# straight arm pulls 5# hip extension had to do sets of 5 due to fatigue and weakness Feet on ball K2C, trunk rotation, bridge and iso abs Static lumbar traction 65# Ionto left GT area 80mA doese  07/27/22   Ionto 80mA dose left GT area    PATIENT EDUCATION: Education details: exam findings, POC, HEP  Person educated: Patient Education method: Explanation, Demonstration, and Handouts Education comprehension: verbalized understanding and returned demonstration   HOME EXERCISE PROGRAM: Access Code: [email or web address] URL:  [email or web address] Date: 07/27/2022 Prepared by: Stacie Glaze  Exercises - Seated Hamstring Stretch with Chair  - 2 x daily -  7 x weekly - 1 sets - 5 reps - 30 hold - Supine Piriformis Stretch Pulling Heel to Hip  - 2 x daily - 7 x weekly - 1 sets - 5 reps - 30 hold - Supine ITB Stretch with Strap  - 2 x daily - 7 x weekly - 1 sets - 5 reps - 30 hold - Supine Lower Trunk Rotation  - 2 x daily - 7 x weekly - 1 sets - 10 reps - 10 hold - Hooklying Single Knee to Chest  - 2 x daily - 7 x weekly - 1 sets - 10 reps - 10 hold    GOALS: Goals reviewed with patient? Yes  SHORT TERM GOALS: Target date: 08/18/22  Will be compliant with appropriate progressive HEP  Goal status: met 08/09/22   LONG TERM GOALS: Target date: 10/26/22  MMT to improve by at least 1 grade in all weak groups  Goal status: ongoing 08/22/22  2.  Decrease pain 50% Goal status: progressing 08/22/22  3. Increase lumbar ROM 25% Goal status: ongoing 08/29/22  4.  Will be independent with appropriate gym based exercise program to maintain functional gains and continue progress on an independent basis  Goal status: ongoing 08/29/22   5:  go up and down stairs step over step Goal Status:  ongoing 08/29/22  ASSESSMENT:  CLINICAL IMPRESSION: Patient has been doing well, she is still c/o cramps in the legs, some in the HS, the calves and in the anterior tibialis, she reports that she had a recent US study that ruled out blood clots.  She had a coughing fit today due to the smell of cologne.  This seemed to cause some cramping.    OBJECTIVE IMPAIRMENTS cardiopulmonary status limiting activity, decreased activity tolerance, decreased balance, decreased cognition, decreased mobility, difficulty walking, decreased strength, and improper body mechanics.   REHAB POTENTIAL: Good  CLINICAL DECISION MAKING: Stable/uncomplicated  EVALUATION COMPLEXITY: Low  PLAN: PT FREQUENCY: 1-2x/week  PT DURATION: 12  weeks  PLANNED INTERVENTIONS: Therapeutic exercises, Therapeutic activity, Neuromuscular re-education, Balance training, Gait training, Patient/Family education, Self Care, Joint mobilization, Stair training, Dry Needling, Cognitive remediation, Electrical stimulation, Cryotherapy, Moist heat, Taping, Ultrasound, Ionotophoresis 4mg /ml Dexamethasone, and Manual therapy  PLAN FOR NEXT SESSION: ionto, traction, flexibility and stability  Toney Sang, PT 08/29/2022, 5:14 PM

## 2022-08-30 ENCOUNTER — Encounter
Payer: BC Managed Care – PPO | Attending: Physical Medicine and Rehabilitation | Admitting: Physical Medicine and Rehabilitation

## 2022-08-30 ENCOUNTER — Encounter: Payer: Self-pay | Admitting: Physical Medicine and Rehabilitation

## 2022-08-30 VITALS — BP 123/83 | HR 81 | Ht 62.0 in | Wt 176.8 lb

## 2022-08-30 DIAGNOSIS — F411 Generalized anxiety disorder: Secondary | ICD-10-CM | POA: Diagnosis present

## 2022-08-30 DIAGNOSIS — G928 Other toxic encephalopathy: Secondary | ICD-10-CM | POA: Diagnosis present

## 2022-08-30 DIAGNOSIS — M5417 Radiculopathy, lumbosacral region: Secondary | ICD-10-CM | POA: Diagnosis present

## 2022-08-30 DIAGNOSIS — M79662 Pain in left lower leg: Secondary | ICD-10-CM | POA: Diagnosis present

## 2022-08-30 DIAGNOSIS — R252 Cramp and spasm: Secondary | ICD-10-CM | POA: Insufficient documentation

## 2022-08-30 MED ORDER — BACLOFEN 10 MG PO TABS: 5.0000 mg | ORAL_TABLET | Freq: Every evening | ORAL | 5 refills | Status: DC | PRN

## 2022-08-30 MED ORDER — GABAPENTIN 100 MG PO CAPS: 100.0000 mg | ORAL_CAPSULE | Freq: Two times a day (BID) | ORAL | 5 refills | Status: DC

## 2022-08-30 NOTE — Progress Notes (Signed)
Subjective:    Patient ID: Megan Reid, female    DOB: 04/02/1966, 57 y.o.   MRN: [id number]  HPI  Megan Reid is a 57 y.o. year old female  who  has a past medical history of Arthralgia (02/14/2016), Asthma, Autoimmune disease (HCC) (02/14/2016), DDD (degenerative disc disease), lumbar (02/14/2016), Fatigue (02/14/2016), Gastritis (02/14/2016), Hot flashes, Migraines (02/14/2016), Osteoarthritis of both hands (02/14/2016), Osteoarthritis of both knees (02/14/2016), and Vitamin D deficiency (02/14/2016).   They are presenting to PM&R clinic for follow up related to metabolic encephalopathy d/t medication reaction, as well as LLE pain .  Plan from last visit: Mild cognitive impairment Assessment & Plan: If things not improving or getting worse despite Tx anxiety, may consider MRI brain at subsequent visits     Anxiety state Assessment & Plan: I have started you on Buspar 5 mg tablets. Take 1 tablet twice daily for anxiety; if you have any side effects or concerns please let me know.   Follow up with me in 3 months     Lumbosacral radiculopathy at L5 Assessment & Plan: I will refer you to Dr. Wynn Banker for L L5 epidural steroid injection       Snapping hip syndrome, left Assessment & Plan: I will see you in 1-2 weeks for an injection of your left greater trochanteric bursa. Please tell your PT your doctor is concerned for snapping hip syndrome so they can tailor your therapies.     Other orders -     busPIRone HCl; Take 1 tablet (5 mg total) by mouth 2 (two) times daily.  Dispense: 60 tablet; Refill: 3     Interval Hx:  - Therapies: Had PT yesterday; left hip has been "on fire" and anterior shin remains painful for the past 2 years. PT thinks she may have injured her shin. She had a duplex to look for a DVT, which was negative. It also occasionally swells.    - Follow ups: Missed her ESI appt with Dr. Wynn Banker due to timing. Did have an EMG in June 2023 for toe  numbness and was diagnosed with radiculopathy; not neuropathy.    - Medications: Still using gabapentin at nihgttime, with benefit.    Left hip injection + patch fm PT has been helpful; he uses a massage machine at PT and that has also been the most helpful.   She says she is unsure if she's still on Buspar; she tried 2 medications, one made her groggy and the other worked better. Has been helpful for her anxiety.    - Other concerns: Has continued her work accommodations, especially with IEP she has to type/talk on a split screen at the same time. She gets around it by pre-charting and copy-pasting things as needed. She feels this system works much better for her.    Pain Inventory Average Pain 7 Pain Right Now 4 My pain is dull  In the last 24 hours, has pain interfered with the following? General activity 3 Relation with others 3 Enjoyment of life 2 What TIME of day is your pain at its worst? evening and night Sleep (in general) Fair  Pain is worse with: bending and inactivity Pain improves with: therapy/exercise Relief from Meds:  na  Family History  Problem Relation Age of Onset   Kidney failure Mother    Heart failure Mother    Lupus Mother    COPD Father    Hypertension Sister    Acromegaly Sister    Breast  cancer Paternal Aunt 61   Social History   Socioeconomic History   Marital status: Married    Spouse name: Not on file   Number of children: 4   Years of education: 16   Highest education level: Not on file  Occupational History   Occupation: Runner, broadcasting/film/video  Tobacco Use   Smoking status: Former    Packs/day: 0.50    Years: 2.00    Additional pack years: 0.00    Total pack years: 1.00    Types: Cigarettes    Quit date: 05/16/1986    Years since quitting: 36.3    Passive exposure: Never   Smokeless tobacco: Never   Tobacco comments:    TEEN AGER  Vaping Use   Vaping Use: Never used  Substance and Sexual Activity   Alcohol use: No   Drug use: No    Sexual activity: Not on file  Other Topics Concern   Not on file  Social History Narrative   Lives with husband.  Has 4 children.  Teacher.  Education: college.       Right Handed    Lives in a one story home    Drinks caffeine   Social Determinants of Health   Financial Resource Strain: Not on file  Food Insecurity: Not on file  Transportation Needs: Not on file  Physical Activity: Not on file  Stress: Not on file  Social Connections: Not on file   Past Surgical History:  Procedure Laterality Date   BREAST BIOPSY Left    ROTATOR CUFF REPAIR Right 06/16/2019   TUBAL LIGATION     Past Surgical History:  Procedure Laterality Date   BREAST BIOPSY Left    ROTATOR CUFF REPAIR Right 06/16/2019   TUBAL LIGATION     Past Medical History:  Diagnosis Date   Arthralgia 02/14/2016   Asthma    Autoimmune disease (HCC) 02/14/2016   Positive RF 139 CCP Negative  Positive ANA 1:640 Positive Ro Positive La Elevated ESR 105 - Sjorgen's Disease   DDD (degenerative disc disease), lumbar 02/14/2016   Fatigue 02/14/2016   Gastritis 02/14/2016   Hot flashes    Migraines 02/14/2016   Osteoarthritis of both hands 02/14/2016   Osteoarthritis of both knees 02/14/2016   Vitamin D deficiency 02/14/2016   BP 123/83   Pulse 81   Ht 5\' 2"  (1.575 m)   Wt 176 lb 12.8 oz (80.2 kg)   LMP 05/22/2012   SpO2 98%   BMI 32.34 kg/m   Opioid Risk Score:   Fall Risk Score:  `1  Depression screen [facility] 2/9     08/30/2022    3:48 PM 07/17/2022    3:35 PM 05/29/2022    3:43 PM 02/22/2022    9:33 AM 01/25/2022   11:28 AM 12/21/2021    1:01 PM 02/13/2017    3:21 PM  Depression screen PHQ 2/9  Decreased Interest 0 0 0 0 0 1 0  Down, Depressed, Hopeless 0 0 0 0 0 0 0  PHQ - 2 Score 0 0 0 0 0 1 0  Altered sleeping      2   Tired, decreased energy      3   Change in appetite      3   Feeling bad or failure about yourself       0   Trouble concentrating      2   Moving slowly or fidgety/restless       2   Suicidal thoughts  0   PHQ-9 Score      13      Review of Systems  Constitutional: Negative.   HENT: Negative.    Eyes: Negative.   Respiratory: Negative.    Cardiovascular: Negative.   Gastrointestinal: Negative.   Endocrine: Negative.   Genitourinary: Negative.   Musculoskeletal:        Left leg thigh and knee  Skin: Negative.   Allergic/Immunologic: Negative.   Neurological: Negative.   Hematological: Negative.   Psychiatric/Behavioral: Negative.    All other systems reviewed and are negative.      Objective:   Physical Exam   PE: Constitution: Appropriate appearance for age. No apparent distress  Resp: No respiratory distress. No accessory muscle usage. on RA and CTAB Cardio: Well perfused appearance. No peripheral edema. Abdomen: Nondistended. Nontender.   Psych: Appropriate mood and affect. Neuro: AAOx4. No apparent cognitive deficits  Sensory exam: r+ Sensitivty to light touch over L medial anterior lower leg form mid-shin to above the ankle Motor exam: strength 5/5 throughout bilateral upper extremities, bilateral lower extremities, and with exception of left EHL 4/5 Coordination: Fine motor coordination was normal.   Gait: normal       Assessment & Plan:   Megan Reid is a 57 y.o. year old female  who  has a past medical history of Arthralgia (02/14/2016), Asthma, Autoimmune disease (HCC) (02/14/2016), DDD (degenerative disc disease), lumbar (02/14/2016), Fatigue (02/14/2016), Gastritis (02/14/2016), Hot flashes, Migraines (02/14/2016), Osteoarthritis of both hands (02/14/2016), Osteoarthritis of both knees (02/14/2016), and Vitamin D deficiency (02/14/2016).   They are presenting to PM&R clinic for follow up related to metabolic encephalopathy d/t medication reaction, as well as LLE pain and sensory deficit.   Toxic metabolic encephalopathy Anxiety state Continue Buspar for anxiety; your primary doctor may take over this medication if you wish    Continue current work accomodations; you can follow up with me before the school year resumes (3-4 months) to re-evaluate  Lumbosacral radiculopathy at L5 Pain in left shin Increase gabapentin to 100 mg twice daily Add baclofen 5 mg nightly as needed for spasms Re-schedule epidural steroid injection with Dr. Wynn Banker; if this does not help your pain/sensitivity, we may get an MRI of your leg at next visit  Other orders -     Baclofen; Take 0.5 tablets (5 mg total) by mouth at bedtime as needed for muscle spasms.  Dispense: 90 each; Refill: 5 -     Gabapentin; Take 1 capsule (100 mg total) by mouth 2 (two) times daily.  Dispense: 60 capsule; Refill: 5

## 2022-08-30 NOTE — Patient Instructions (Signed)
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 Wynn Banker; if this does not help your pain/sensitivity, we may get an MRI of your leg at next visit

## 2022-09-04 ENCOUNTER — Ambulatory Visit: Payer: BC Managed Care – PPO | Admitting: Physical Therapy

## 2022-09-04 ENCOUNTER — Encounter: Payer: Self-pay | Admitting: Physical Therapy

## 2022-09-04 DIAGNOSIS — M5459 Other low back pain: Secondary | ICD-10-CM

## 2022-09-04 DIAGNOSIS — M6281 Muscle weakness (generalized): Secondary | ICD-10-CM

## 2022-09-04 DIAGNOSIS — M25552 Pain in left hip: Secondary | ICD-10-CM

## 2022-09-04 NOTE — Therapy (Signed)
OUTPATIENT PHYSICAL THERAPY NEURO TREATMENT   Patient Name: Megan Reid MRN: [id number] DOB:20-Nov-1965, 57 y.o., female Today's Date: 09/04/2022   PCP: [name]  REFERRING PROVIDER: Angelina Sheriff, DO    PT End of Session - 09/04/22 1700     Visit Number 5    Date for PT Re-Evaluation 10/26/22    Authorization Type BC[employer]    PT Start Time 1700    PT Stop Time 1745    PT Time Calculation (min) 45 min    Activity Tolerance Patient tolerated treatment well    Behavior During Therapy [facility] for tasks assessed/performed              Past Medical History:  Diagnosis Date   Arthralgia 02/14/2016   Asthma    Autoimmune disease (HCC) 02/14/2016   Positive RF 139 CCP Negative  Positive ANA 1:640 Positive Ro Positive La Elevated ESR 105 - Sjorgen's Disease   DDD (degenerative disc disease), lumbar 02/14/2016   Fatigue 02/14/2016   Gastritis 02/14/2016   Hot flashes    Migraines 02/14/2016   Osteoarthritis of both hands 02/14/2016   Osteoarthritis of both knees 02/14/2016   Vitamin D deficiency 02/14/2016   Past Surgical History:  Procedure Laterality Date   BREAST BIOPSY Left    ROTATOR CUFF REPAIR Right 06/16/2019   TUBAL LIGATION     Patient Active Problem List   Diagnosis Date Noted   Foot spasms 08/30/2022   Pain in left shin 08/30/2022   Greater trochanteric bursitis of left hip 07/23/2022   Snapping hip syndrome, 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   Lumbosacral radiculopathy at L5 05/29/2022   Anxiety state 05/29/2022   Gastroesophageal reflux disease 03/08/2022   Decreased mobility and endurance 01/25/2022   Difficulty sleeping 01/25/2022   Dairy product intolerance 01/25/2022   Mild cognitive impairment 01/25/2022   Paresthesia of left leg 01/25/2022   Anemia 12/20/2021   Abnormal LFTs 12/20/2021   Toxic metabolic encephalopathy 12/12/2021   Thrombocytopenia (HCC) 12/04/2021   Seasonal and perennial allergic rhinitis 12/02/2020   Chronic coughing  12/02/2020   Sjogren's syndrome (HCC) 12/02/2020   Leukopenia 12/02/2020   Adverse reaction to food, subsequent encounter 06/23/2020   Heartburn 06/23/2020   Bruising 06/23/2020   Abrasion of right ankle 02/13/2017   Rheumatoid factor positive 09/01/2016   Leucopenia 09/01/2016   Autoimmune disease (HCC) 02/14/2016   Fatigue 02/14/2016   DDD (degenerative disc disease), lumbar 02/14/2016   Osteoarthritis of both knees 02/14/2016   Osteoarthritis of both hands 02/14/2016   Asthma 02/14/2016   Migraines 02/14/2016   Gastritis 02/14/2016   Vitamin D deficiency 02/14/2016   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HCC) 02/14/2016   High risk medication use 02/14/2016    ONSET DATE: 12/21/2021   REFERRING DIAG: left hip pain and lumbar stenosis  THERAPY DIAG:  Other low back pain  Pain in left hip  Muscle weakness (generalized)  Rationale for Evaluation and Treatment Rehabilitation  SUBJECTIVE:  SUBJECTIVE STATEMENT: Pretty good, it was raining this week so I stayed in and slept.  Still have some pain and tightness in the left hip at times  Pt accompanied by: self  PERTINENT HISTORY: Brief HPI:   Megan Reid is a 57 y.o. female with history of DDD lumbar spine, asthma, autoimmune disease, Sjogren's with recent onset of trigeminal neuralgia and was started on Tegretol.  She was also reported to have been taking ibuprofen as well as Tylenol for pain management.  She was found to have metabolic encephalopathy with acute hepatic and renal failure.  Renal failure treated with IV fluids and bicarb .  AKI felt to be due to ATN from acute illness, diarrhea and ibuprofen use.  She developed fever past admission and was treated with IV antibiotics due to concerns of sepsis.  Work-up was  negative for DR ESS, tickborne illness, CMV, EBV and acute hepatitis/pancytopenia felt to be secondary to Tegretol toxicity.  She did not have pancytopenia and supportive care was recommended by heme-onc.  Renal status and abnormal LFTs were resolving however patient continued limited by weakness, DOE as well as encephalopathy with delayed processing.  CIR was recommended due to functional decline.     Hospital Course: Megan Reid was admitted to rehab 12/12/2021 for inpatient therapies to consist of PT, ST and OT at least three hours five days a week. Past admission physiatrist, therapy team and rehab RN have worked together to provide customized collaborative inpatient rehab.  Her blood pressures were monitored on TID basis and has been stable.  Follow-up check of electrolytes revealed renal status as well as abnormal LFTs to be gradually improving.  She was found to have drop in magnesium to 1.3 requiring IV supplementation as well as p.o. supplement.  Repeat check showed improvement with magnesium of 2.0.  Recommend repeat check in 1 to 2 weeks to monitor for stability as well as need to continue oral supplementation.   Follow-up CBC showed improvement in white count with stable H&H and that thrombocytopenia had resolved.  Her alertness and mentation has gradually improved with improvement in activity tolerance and respiratory status.  Hydroxyzine 10 mg 3 times daily as needed was added to help manage anxiety.  Sleep-wake disruption is improved with addition of melatonin.  Constipation has resolved with augmentation of bowel regimen.  She has made steady gains during her rehab stay and is modified independent at discharge.  Supervision is recommended with cognitive task and for safety.  She will continue to receive follow-up outpatient PT, OT and ST at [facility] outpatient rehab after discharge.     Rehab course: During patient's stay in rehab weekly team conferences were held to monitor patient's  progress, set goals and discuss barriers to discharge. At admission, patient required min assist with mobility and with ADL tasks.  She exhibited mild cognitive deficits affecting recall, memory and executive function with SLUMS score 22/30. She  has had improvement in activity tolerance, balance, postural control as well as ability to compensate for deficits. She is able to complete ADL tasks at modified independent level. She is independent for transfers and is able to ambulate 150' without AD. She is able to climb 12 stairs independently. She requires supervision with functional and complex cognitive tasks. Her SLUMS score has improved to 26/30. Family education has been completed.    Disposition: Home  PAIN:  Are you having pain? Yes, pain in the left hip a 6/10, back pain is a 5/10 Aggravating:  sit, lie down pain can increase Relieving:  stretch helps, reports that she stopped the medication due to wanting to not be on it. She also is having an increase pain in the left calf, she had a negative Homan's test, no temperature  PRECAUTIONS: Fall  WEIGHT BEARING RESTRICTIONS No  FALLS: Has patient fallen in last 6 months? No falls LIVING ENVIRONMENT: Lives with: lives with their spouse Lives in: House/apartment Stairs: 4 STE B rails, home is handicap accessible except for bathtub  Has following equipment at home: None  PLOF: Independent and Independent with basic ADLs  Teacher special education  PATIENT GOALS build strength and balance, have less pain  OBJECTIVE:   DIAGNOSTIC FINDINGS:  lumbar stenosis  COGNITION: WNL    SENSATION: Not tested  MUSCLE LENGTH:  tight HS, calf, piriformis SLR 45 degrees with pain, very tight and tender in the ITB  POSTURE: decreased lordosis   LUMBAR ROM: decreased 50% with pain  LOWER EXTREMITY ROM:     Active  Right Eval Left Eval  Hip flexion    Hip extension 10 10  Hip abduction 15 15  Hip adduction    Hip internal rotation     Hip external rotation    Knee flexion    Knee extension    Ankle dorsiflexion    Ankle plantarflexion    Ankle inversion    Ankle eversion     (Blank rows = not tested)  LOWER EXTREMITY MMT:    MMT Right Eval Left Eval  Hip flexion 4- 3+  Hip extension 3+ 3+  Hip abduction 3+ 3+ P!  Hip adduction    Hip internal rotation    Hip external rotation    Knee flexion 4 4  Knee extension 4 4  Ankle dorsiflexion 3 3  Ankle plantarflexion    Ankle inversion    Ankle eversion    (Blank rows = not tested)  TRANSFERS: Assistive device utilized: None  Sit to stand: Complete Independence Stand to sit: Complete Independence Chair to chair: Complete Independence Floor:   STAIRS: does one at a time due to pain    GAIT: Gait pattern: WFL and trendelenburg left Distance walked: 100 feet Assistive device utilized: None Level of assistance: Complete Independence Comments: sore and tired   FUNCTIONAL TESTs:  TUG 16 seconds 5XSTS 22 seconds with pain PATIENT SURVEYS:  FOTO = 44  TODAY'S TREATMENT:  09/04/22 Brisk pace walk around the back building a little SOB at the end 25# rows 2x10 25# lats 2x10 Black tband lumbar extension 2x10 On airex 10# straight arm pulls 10# AR press 5# hip extension and abduction, very tough for her STM to the glutes and the ITB  08/29/22 Nustep level 5 x 6 minutes 25# leg curls 5# leg extension Leg press 20# 2x10 20# rows 20# lats STM to the left buttock and left ITB  08/22/22 Nustep level 5 x 6 minutes Calf stretches On airex 10# straight arm pulls Leg press 20# 2x10 25# leg curls 2x10 5# leg extension 2x10 5# hip abduction and extension x 10 each Feet on ball K2C, rotation, bridge and iso abs Passive stretch LE's Traction lumbar 60# static  08/09/22 Nustep level 5 x 6 minutes Leg press 20# Passive ROM LE's  On airex 10# straight arm pulls 5# hip extension had to do sets of 5 due to fatigue and weakness Feet on ball K2C,  trunk rotation, bridge and iso abs Static lumbar traction 65# Ionto left GT area 80mA  doese  07/27/22   Ionto 80mA dose left GT area    PATIENT EDUCATION: Education details: exam findings, POC, HEP  Person educated: Patient Education method: Explanation, Demonstration, and Handouts Education comprehension: verbalized understanding and returned demonstration   HOME EXERCISE PROGRAM: Access Code: [email or web address] URL: [email or web address] Date: 07/27/2022 Prepared by: Stacie Glaze  Exercises - Seated Hamstring Stretch with Chair  - 2 x daily - 7 x weekly - 1 sets - 5 reps - 30 hold - Supine Piriformis Stretch Pulling Heel to Hip  - 2 x daily - 7 x weekly - 1 sets - 5 reps - 30 hold - Supine ITB Stretch with Strap  - 2 x daily - 7 x weekly - 1 sets - 5 reps - 30 hold - Supine Lower Trunk Rotation  - 2 x daily - 7 x weekly - 1 sets - 10 reps - 10 hold - Hooklying Single Knee to Chest  - 2 x daily - 7 x weekly - 1 sets - 10 reps - 10 hold    GOALS: Goals reviewed with patient? Yes  SHORT TERM GOALS: Target date: 08/18/22  Will be compliant with appropriate progressive HEP  Goal status: met 08/09/22   LONG TERM GOALS: Target date: 10/26/22  MMT to improve by at least 1 grade in all weak groups  Goal status: ongoing 08/22/22  2.  Decrease pain 50% Goal status: progressing 09/04/22  3. Increase lumbar ROM 25% Goal status: ongoing 08/29/22  4.  Will be independent with appropriate gym based exercise program to maintain functional gains and continue progress on an independent basis  Goal status: ongoing 08/29/22   5:  go up and down stairs step over step Goal Status:  ongoing 08/29/22  ASSESSMENT:  CLINICAL IMPRESSION: Patient reports she got some rest this weekend and is feeling better.  Less pain I added some core and hip exercises, she did struggle with these, the hip abduction really got her hips burning, finished with STM using the theragun  OBJECTIVE  IMPAIRMENTS cardiopulmonary status limiting activity, decreased activity tolerance, decreased balance, decreased cognition, decreased mobility, difficulty walking, decreased strength, and improper body mechanics.   REHAB POTENTIAL: Good  CLINICAL DECISION MAKING: Stable/uncomplicated  EVALUATION COMPLEXITY: Low  PLAN: PT FREQUENCY: 1-2x/week  PT DURATION: 12 weeks  PLANNED INTERVENTIONS: Therapeutic exercises, Therapeutic activity, Neuromuscular re-education, Balance training, Gait training, Patient/Family education, Self Care, Joint mobilization, Stair training, Dry Needling, Cognitive remediation, Electrical stimulation, Cryotherapy, Moist heat, Taping, Ultrasound, Ionotophoresis 4mg /ml Dexamethasone, and Manual therapy  PLAN FOR NEXT SESSION: progress strength and flexibility as tolerated  Micahel Marieelena Bartko, PT 09/04/2022, 5:01 PM

## 2022-09-13 ENCOUNTER — Encounter: Payer: Self-pay | Admitting: Physical Therapy

## 2022-09-13 ENCOUNTER — Ambulatory Visit: Payer: BC Managed Care – PPO | Admitting: Physical Therapy

## 2022-09-13 DIAGNOSIS — M25552 Pain in left hip: Secondary | ICD-10-CM

## 2022-09-13 DIAGNOSIS — M5459 Other low back pain: Secondary | ICD-10-CM

## 2022-09-13 DIAGNOSIS — M6281 Muscle weakness (generalized): Secondary | ICD-10-CM

## 2022-09-13 NOTE — Therapy (Signed)
OUTPATIENT PHYSICAL THERAPY NEURO TREATMENT   Patient Name: Megan Reid MRN: [id number] DOB:1966-02-03, 57 y.o., female Today's Date: 09/13/2022   PCP: [name]  REFERRING PROVIDER: Angelina Sheriff, DO    PT End of Session - 09/13/22 1750     Visit Number 6    Date for PT Re-Evaluation 10/26/22    Authorization Type BC[employer]    PT Start Time 1745    PT Stop Time 1830    PT Time Calculation (min) 45 min    Activity Tolerance Patient tolerated treatment well    Behavior During Therapy [facility] for tasks assessed/performed              Past Medical History:  Diagnosis Date   Arthralgia 02/14/2016   Asthma    Autoimmune disease (HCC) 02/14/2016   Positive RF 139 CCP Negative  Positive ANA 1:640 Positive Ro Positive La Elevated ESR 105 - Sjorgen's Disease   DDD (degenerative disc disease), lumbar 02/14/2016   Fatigue 02/14/2016   Gastritis 02/14/2016   Hot flashes    Migraines 02/14/2016   Osteoarthritis of both hands 02/14/2016   Osteoarthritis of both knees 02/14/2016   Vitamin D deficiency 02/14/2016   Past Surgical History:  Procedure Laterality Date   BREAST BIOPSY Left    ROTATOR CUFF REPAIR Right 06/16/2019   TUBAL LIGATION     Patient Active Problem List   Diagnosis Date Noted   Foot spasms 08/30/2022   Pain in left shin 08/30/2022   Greater trochanteric bursitis of left hip 07/23/2022   Snapping hip syndrome, 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   Lumbosacral radiculopathy at L5 05/29/2022   Anxiety state 05/29/2022   Gastroesophageal reflux disease 03/08/2022   Decreased mobility and endurance 01/25/2022   Difficulty sleeping 01/25/2022   Dairy product intolerance 01/25/2022   Mild cognitive impairment 01/25/2022   Paresthesia of left leg 01/25/2022   Anemia 12/20/2021   Abnormal LFTs 12/20/2021   Toxic metabolic encephalopathy 12/12/2021   Thrombocytopenia (HCC) 12/04/2021   Seasonal and perennial allergic rhinitis 12/02/2020   Chronic coughing  12/02/2020   Sjogren's syndrome (HCC) 12/02/2020   Leukopenia 12/02/2020   Adverse reaction to food, subsequent encounter 06/23/2020   Heartburn 06/23/2020   Bruising 06/23/2020   Abrasion of right ankle 02/13/2017   Rheumatoid factor positive 09/01/2016   Leucopenia 09/01/2016   Autoimmune disease (HCC) 02/14/2016   Fatigue 02/14/2016   DDD (degenerative disc disease), lumbar 02/14/2016   Osteoarthritis of both knees 02/14/2016   Osteoarthritis of both hands 02/14/2016   Asthma 02/14/2016   Migraines 02/14/2016   Gastritis 02/14/2016   Vitamin D deficiency 02/14/2016   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HCC) 02/14/2016   High risk medication use 02/14/2016    ONSET DATE: 12/21/2021   REFERRING DIAG: left hip pain and lumbar stenosis  THERAPY DIAG:  Other low back pain  Pain in left hip  Muscle weakness (generalized)  Rationale for Evaluation and Treatment Rehabilitation  SUBJECTIVE:  SUBJECTIVE STATEMENT: I have been doing well, my pain is up and down, left hip, today it is bothering me  Pt accompanied by: self  PERTINENT HISTORY: Brief HPI:   [name] is a 57 y.o. female with history of DDD lumbar spine, asthma, autoimmune disease, Sjogren's with recent onset of trigeminal neuralgia and was started on Tegretol.  She was also reported to have been taking ibuprofen as well as Tylenol for pain management.  She was found to have metabolic encephalopathy with acute hepatic and renal failure.  Renal failure treated with IV fluids and bicarb .  AKI felt to be due to ATN from acute illness, diarrhea and ibuprofen use.  She developed fever past admission and was treated with IV antibiotics due to concerns of sepsis.  Work-up was negative for DR ESS, tickborne illness, CMV,  EBV and acute hepatitis/pancytopenia felt to be secondary to Tegretol toxicity.  She did not have pancytopenia and supportive care was recommended by heme-onc.  Renal status and abnormal LFTs were resolving however patient continued limited by weakness, DOE as well as encephalopathy with delayed processing.  CIR was recommended due to functional decline.     Hospital Course: YANESSA THREATS was admitted to rehab 12/12/2021 for inpatient therapies to consist of PT, ST and OT at least three hours five days a week. Past admission physiatrist, therapy team and rehab RN have worked together to provide customized collaborative inpatient rehab.  Her blood pressures were monitored on TID basis and has been stable.  Follow-up check of electrolytes revealed renal status as well as abnormal LFTs to be gradually improving.  She was found to have drop in magnesium to 1.3 requiring IV supplementation as well as p.o. supplement.  Repeat check showed improvement with magnesium of 2.0.  Recommend repeat check in 1 to 2 weeks to monitor for stability as well as need to continue oral supplementation.   Follow-up CBC showed improvement in white count with stable H&H and that thrombocytopenia had resolved.  Her alertness and mentation has gradually improved with improvement in activity tolerance and respiratory status.  Hydroxyzine 10 mg 3 times daily as needed was added to help manage anxiety.  Sleep-wake disruption is improved with addition of melatonin.  Constipation has resolved with augmentation of bowel regimen.  She has made steady gains during her rehab stay and is modified independent at discharge.  Supervision is recommended with cognitive task and for safety.  She will continue to receive follow-up outpatient PT, OT and ST at [facility] outpatient rehab after discharge.     Rehab course: During patient's stay in rehab weekly team conferences were held to monitor patient's progress, set goals and discuss barriers to  discharge. At admission, patient required min assist with mobility and with ADL tasks.  She exhibited mild cognitive deficits affecting recall, memory and executive function with SLUMS score 22/30. She  has had improvement in activity tolerance, balance, postural control as well as ability to compensate for deficits. She is able to complete ADL tasks at modified independent level. She is independent for transfers and is able to ambulate 150' without AD. She is able to climb 12 stairs independently. She requires supervision with functional and complex cognitive tasks. Her SLUMS score has improved to 26/30. Family education has been completed.    Disposition: Home  PAIN:  Are you having pain? Yes, pain in the left hip a 6/10, back pain is a 5/10 Aggravating:  sit, lie down pain can increase Relieving:  stretch helps, reports that she stopped the medication due to wanting to not be on it. She also is having an increase pain in the left calf, she had a negative Homan's test, no temperature  PRECAUTIONS: Fall  WEIGHT BEARING RESTRICTIONS No  FALLS: Has patient fallen in last 6 months? No falls LIVING ENVIRONMENT: Lives with: lives with their spouse Lives in: House/apartment Stairs: 4 STE B rails, home is handicap accessible except for bathtub  Has following equipment at home: None  PLOF: Independent and Independent with basic ADLs  Teacher special education  PATIENT GOALS build strength and balance, have less pain  OBJECTIVE:   DIAGNOSTIC FINDINGS:  lumbar stenosis  COGNITION: WNL    SENSATION: Not tested  MUSCLE LENGTH:  tight HS, calf, piriformis SLR 45 degrees with pain, very tight and tender in the ITB  POSTURE: decreased lordosis   LUMBAR ROM: decreased 50% with pain  LOWER EXTREMITY ROM:     Active  Right Eval Left Eval  Hip flexion    Hip extension 10 10  Hip abduction 15 15  Hip adduction    Hip internal rotation    Hip external rotation    Knee flexion     Knee extension    Ankle dorsiflexion    Ankle plantarflexion    Ankle inversion    Ankle eversion     (Blank rows = not tested)  LOWER EXTREMITY MMT:    MMT Right Eval Left Eval  Hip flexion 4- 3+  Hip extension 3+ 3+  Hip abduction 3+ 3+ P!  Hip adduction    Hip internal rotation    Hip external rotation    Knee flexion 4 4  Knee extension 4 4  Ankle dorsiflexion 3 3  Ankle plantarflexion    Ankle inversion    Ankle eversion    (Blank rows = not tested)  TRANSFERS: Assistive device utilized: None  Sit to stand: Complete Independence Stand to sit: Complete Independence Chair to chair: Complete Independence Floor:   STAIRS: does one at a time due to pain    GAIT: Gait pattern: WFL and trendelenburg left Distance walked: 100 feet Assistive device utilized: None Level of assistance: Complete Independence Comments: sore and tired   FUNCTIONAL TESTs:  TUG 16 seconds 5XSTS 22 seconds with pain PATIENT SURVEYS:  FOTO = 44  TODAY'S TREATMENT:  09/13/22 Nustep level 5 x 5 minutes Brisk walk around the back building SOB up slope 25# Leg curls 5# leg extension Leg press 40# 2x10 25# lats 2x10 10# AR press 5# hip abduction and extension STM with the theragun to the left buttocks Passive stretch to the LE's  09/04/22 Brisk pace walk around the back building a little SOB at the end 25# rows 2x10 25# lats 2x10 Black tband lumbar extension 2x10 On airex 10# straight arm pulls 10# AR press 5# hip extension and abduction, very tough for her STM to the glutes and the ITB  08/29/22 Nustep level 5 x 6 minutes 25# leg curls 5# leg extension Leg press 20# 2x10 20# rows 20# lats STM to the left buttock and left ITB  08/22/22 Nustep level 5 x 6 minutes Calf stretches On airex 10# straight arm pulls Leg press 20# 2x10 25# leg curls 2x10 5# leg extension 2x10 5# hip abduction and extension x 10 each Feet on ball K2C, rotation, bridge and iso abs Passive  stretch LE's Traction lumbar 60# static  08/09/22 Nustep level 5 x 6 minutes Leg press 20#  Passive ROM LE's  On airex 10# straight arm pulls 5# hip extension had to do sets of 5 due to fatigue and weakness Feet on ball K2C, trunk rotation, bridge and iso abs Static lumbar traction 65# Ionto left GT area 80mA doese  07/27/22   Ionto 80mA dose left GT area    PATIENT EDUCATION: Education details: exam findings, POC, HEP  Person educated: Patient Education method: Programmer, multimedia, Demonstration, and Handouts Education comprehension: verbalized understanding and returned demonstration   HOME EXERCISE PROGRAM: Access Code: [email or web address] URL: [email or web address] Date: 07/27/2022 Prepared by: Stacie Glaze  Exercises - Seated Hamstring Stretch with Chair  - 2 x daily - 7 x weekly - 1 sets - 5 reps - 30 hold - Supine Piriformis Stretch Pulling Heel to Hip  - 2 x daily - 7 x weekly - 1 sets - 5 reps - 30 hold - Supine ITB Stretch with Strap  - 2 x daily - 7 x weekly - 1 sets - 5 reps - 30 hold - Supine Lower Trunk Rotation  - 2 x daily - 7 x weekly - 1 sets - 10 reps - 10 hold - Hooklying Single Knee to Chest  - 2 x daily - 7 x weekly - 1 sets - 10 reps - 10 hold    GOALS: Goals reviewed with patient? Yes  SHORT TERM GOALS: Target date: 08/18/22  Will be compliant with appropriate progressive HEP  Goal status: met 08/09/22   LONG TERM GOALS: Target date: 10/26/22  MMT to improve by at least 1 grade in all weak groups  Goal status: ongoing 08/22/22  2.  Decrease pain 50% Goal status: progressing 09/04/22  3. Increase lumbar ROM 25% Goal status: progressing 09/13/22  4.  Will be independent with appropriate gym based exercise program to maintain functional gains and continue progress on an independent basis  Goal status: ongoing 08/29/22   5:  go up and down stairs step over step Goal Status:  ongoing 08/29/22  ASSESSMENT:  CLINICAL IMPRESSION: Patient Did  well, she has improved her motion and her ability to do, she is still very tight in the HS, piriformis and ITB, She is very tender in the left buttock and ITB and HS, I used the theragun here and she tolerated well but very tender OBJECTIVE IMPAIRMENTS cardiopulmonary status limiting activity, decreased activity tolerance, decreased balance, decreased cognition, decreased mobility, difficulty walking, decreased strength, and improper body mechanics.   REHAB POTENTIAL: Good  CLINICAL DECISION MAKING: Stable/uncomplicated  EVALUATION COMPLEXITY: Low  PLAN: PT FREQUENCY: 1-2x/week  PT DURATION: 12 weeks  PLANNED INTERVENTIONS: Therapeutic exercises, Therapeutic activity, Neuromuscular re-education, Balance training, Gait training, Patient/Family education, Self Care, Joint mobilization, Stair training, Dry Needling, Cognitive remediation, Electrical stimulation, Cryotherapy, Moist heat, Taping, Ultrasound, Ionotophoresis 4mg /ml Dexamethasone, and Manual therapy  PLAN FOR NEXT SESSION: progress strength and flexibility as tolerated  Micahel Diannia Hogenson, PT 09/13/2022, 5:50 PM

## 2022-09-20 ENCOUNTER — Ambulatory Visit: Payer: BC Managed Care – PPO | Attending: Physical Medicine and Rehabilitation | Admitting: Physical Therapy

## 2022-09-27 ENCOUNTER — Ambulatory Visit: Payer: BC Managed Care – PPO | Admitting: Physical Therapy

## 2022-09-29 ENCOUNTER — Encounter
Payer: BC Managed Care – PPO | Attending: Physical Medicine and Rehabilitation | Admitting: Physical Medicine & Rehabilitation

## 2022-09-29 DIAGNOSIS — M5417 Radiculopathy, lumbosacral region: Secondary | ICD-10-CM | POA: Insufficient documentation

## 2022-10-03 ENCOUNTER — Ambulatory Visit: Payer: BC Managed Care – PPO | Admitting: Physical Therapy

## 2022-10-10 ENCOUNTER — Ambulatory Visit: Payer: BC Managed Care – PPO | Admitting: Physical Therapy

## 2022-11-22 ENCOUNTER — Encounter
Payer: BC Managed Care – PPO | Attending: Physical Medicine and Rehabilitation | Admitting: Physical Medicine and Rehabilitation

## 2022-11-22 ENCOUNTER — Encounter: Payer: Self-pay | Admitting: Physical Medicine and Rehabilitation

## 2022-11-22 VITALS — BP 155/102 | HR 74 | Ht 70.0 in | Wt 181.0 lb

## 2022-11-22 DIAGNOSIS — G928 Other toxic encephalopathy: Secondary | ICD-10-CM | POA: Insufficient documentation

## 2022-11-22 DIAGNOSIS — I10 Essential (primary) hypertension: Secondary | ICD-10-CM | POA: Diagnosis present

## 2022-11-22 DIAGNOSIS — M5417 Radiculopathy, lumbosacral region: Secondary | ICD-10-CM | POA: Diagnosis present

## 2022-11-22 DIAGNOSIS — F411 Generalized anxiety disorder: Secondary | ICD-10-CM | POA: Diagnosis present

## 2022-11-22 DIAGNOSIS — M79662 Pain in left lower leg: Secondary | ICD-10-CM | POA: Diagnosis present

## 2022-11-22 MED ORDER — GABAPENTIN 100 MG PO CAPS: 100.0000 mg | ORAL_CAPSULE | Freq: Every day | ORAL | 5 refills | Status: DC

## 2022-11-22 MED ORDER — GABAPENTIN 100 MG PO CAPS: 100.0000 mg | ORAL_CAPSULE | Freq: Every day | ORAL | 5 refills | Status: AC

## 2022-11-22 NOTE — Progress Notes (Signed)
Subjective:    Patient ID: Megan Reid, female    DOB: 05/04/1965, 57 y.o.   MRN: [id number]  HPI  Megan Reid is a 57 y.o. year old female  who  has a past medical history of Arthralgia (02/14/2016), Asthma, Autoimmune disease (HCC) (02/14/2016), DDD (degenerative disc disease), lumbar (02/14/2016), Fatigue (02/14/2016), Gastritis (02/14/2016), Hot flashes, Migraines (02/14/2016), Osteoarthritis of both hands (02/14/2016), Osteoarthritis of both knees (02/14/2016), and Vitamin D deficiency (02/14/2016).   They are presenting to PM&R clinic for follow up related to metabolic encephalopathy d/t medication reaction, as well as LLE pain and sensory deficit.   Plan from last visit: Toxic metabolic encephalopathy Anxiety state Continue Buspar for anxiety; your primary doctor may take over this medication if you wish    Continue current work accomodations; you can follow up with me before the school year resumes (3-4 months) to re-evaluate   Lumbosacral radiculopathy at L5 Pain in left shin Increase gabapentin to 100 mg twice daily Add baclofen 5 mg nightly as needed for spasms Re-schedule epidural steroid injection with Dr. Wynn Banker; if this does not help your pain/sensitivity, we may get an MRI of your leg at next visit   Interval Hx:   - Follow ups: Missed her appointment for injection with Dr. Wynn Banker because the end of the school year was too busy.   - Medications: She is taking gabapentin only once daily, before bedtime which is helpful. She hasn't needed baclofen. She has an over the counter rub (voltaren) for arthritis. She is no longer taking Buspar for anxiety, as summer has been less stressful. She is planning on resuming once she goes back to school.   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 Is somewhat anxious about return to work at [employer] but is trying not to stress; "as long as I dont try to go above and beyond, I'll be  fine"  Pain Inventory Average Pain 4 Pain Right Now 4 My pain is intermittent and dull  In the last 24 hours, has pain interfered with the following? General activity 4 Relation with others 5 Enjoyment of life 9 What TIME of day is your pain at its worst? night Sleep (in general) Fair  Pain is worse with: unsure Pain improves with: rest Relief from Meds: 0  Family History  Problem Relation Age of Onset   Kidney failure Mother    Heart failure Mother    Lupus Mother    COPD Father    Hypertension Sister    Acromegaly Sister    Breast cancer Paternal Aunt 11   Social History   Socioeconomic History   Marital status: Married    Spouse name: Not on file   Number of children: 4   Years of education: 16   Highest education level: Not on file  Occupational History   Occupation: Runner, broadcasting/film/video  Tobacco Use   Smoking status: Former    Current packs/day: 0.00    Average packs/day: 0.5 packs/day for 2.0 years (1.0 ttl pk-yrs)    Types: Cigarettes    Start date: 05/16/1984    Quit date: 05/16/1986    Years since quitting: 36.5    Passive exposure: Never   Smokeless tobacco: Never   Tobacco comments:    TEEN AGER  Vaping Use   Vaping status: Never Used  Substance and Sexual Activity   Alcohol use: No   Drug use: No   Sexual activity: Not on file  Other Topics Concern   Not on file  Social History Narrative   Lives with husband.  Has 4 children.  Teacher.  Education: college.       Right Handed    Lives in a one story home    Drinks caffeine   Social Determinants of Health   Financial Resource Strain: Not on file  Food Insecurity: Not on file  Transportation Needs: Not on file  Physical Activity: Not on file  Stress: Not on file  Social Connections: Not on file   Past Surgical History:  Procedure Laterality Date   BREAST BIOPSY Left    ROTATOR CUFF REPAIR Right 06/16/2019   TUBAL LIGATION     Past Surgical History:  Procedure Laterality Date   BREAST BIOPSY  Left    ROTATOR CUFF REPAIR Right 06/16/2019   TUBAL LIGATION     Past Medical History:  Diagnosis Date   Arthralgia 02/14/2016   Asthma    Autoimmune disease (HCC) 02/14/2016   Positive RF 139 CCP Negative  Positive ANA 1:640 Positive Ro Positive La Elevated ESR 105 - Sjorgen's Disease   DDD (degenerative disc disease), lumbar 02/14/2016   Fatigue 02/14/2016   Gastritis 02/14/2016   Hot flashes    Migraines 02/14/2016   Osteoarthritis of both hands 02/14/2016   Osteoarthritis of both knees 02/14/2016   Vitamin D deficiency 02/14/2016   LMP 05/22/2012   Opioid Risk Score:   Fall Risk Score:  `1  Depression screen [facility] 2/9     08/30/2022    3:48 PM 07/17/2022    3:35 PM 05/29/2022    3:43 PM 02/22/2022    9:33 AM 01/25/2022   11:28 AM 12/21/2021    1:01 PM 02/13/2017    3:21 PM  Depression screen PHQ 2/9  Decreased Interest 0 0 0 0 0 1 0  Down, Depressed, Hopeless 0 0 0 0 0 0 0  PHQ - 2 Score 0 0 0 0 0 1 0  Altered sleeping      2   Tired, decreased energy      3   Change in appetite      3   Feeling bad or failure about yourself       0   Trouble concentrating      2   Moving slowly or fidgety/restless      2   Suicidal thoughts      0   PHQ-9 Score      13       Review of Systems  Musculoskeletal:        LT leg pain  All other systems reviewed and are negative.      Objective:   Physical Exam   PE: Constitution: Appropriate appearance for age. No apparent distress   Resp: No respiratory distress. No accessory muscle usage. on RA Cardio: Well perfused appearance.  1+ edema L foot Pulable pulses Capillary refill brisk  Abdomen: Nondistended. Nontender.   Psych: Appropriate mood and affect. Neuro: AAOx4. No apparent cognitive deficits   Neurologic Exam:   DTRs: Reflexes were 2+ in bilateral achilles, patella, biceps, BR and triceps. Babinsky: flexor responses b/l.   Hoffmans: negative b/l Sensory exam: revealed normal sensation in all dermatomal  regions except Altered sesation to minimal palpation anterior mid-left shin and calf, not extending into feet or past knee.  Motor exam: strength 5/5 throughout bilateral upper extremities and bilateral lower extremities Coordination: Fine motor coordination was normal.   Gait: normal  Assessment & Plan:   Megan Reid is a 57 y.o. year old female  who  has a past medical history of Arthralgia (02/14/2016), Asthma, Autoimmune disease (HCC) (02/14/2016), DDD (degenerative disc disease), lumbar (02/14/2016), Fatigue (02/14/2016), Gastritis (02/14/2016), Hot flashes, Migraines (02/14/2016), Osteoarthritis of both hands (02/14/2016), Osteoarthritis of both knees (02/14/2016), and Vitamin D deficiency (02/14/2016).   They are presenting to PM&R clinic as a new patient for treatment of NTBI d/t toxic metabolic encephalopathy from tegratol .    Toxic metabolic encephalopathy Anxiety state Getting better handle on stressors, doing well overall. 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 No longer on Buspar. Stop baclofen  Essential Hypertension Follow up with your PCP for blood pressure management  Lumbosacral radiculopathy at L5 Pain in left shin Follow up for EMG of left leg for ongoing numbness/tingling Follow up with me 2 weeks after procedure to review results Try capsaicin cream over the counter for nerve pain in your shin I recommend rescheduling appointment with Dr. Wynn Banker for injection I refilled your gabapentin  Other orders -     Gabapentin; Take 1 capsule (100 mg total) by mouth daily.  Dispense: 90 capsule; Refill: 5

## 2022-11-22 NOTE — Patient Instructions (Addendum)
Follow up for EMG of left leg for ongoing numbness/tingling Follow up with me 2 weeks after procedure to review results  Try capsaicin cream over the counter for nerve pain in your shin I recommend rescheduling appointment with Dr. Wynn Banker for injection I refilled your gabapentin Stop balcofen Follow up with your PCP for blood pressure manageement

## 2022-11-28 NOTE — Progress Notes (Unsigned)
  PROCEDURE RECORD [facility]   Name: Megan Reid DOB:1965/05/23 MRN: [id number]  Date:11/28/2022  Physician: Claudette Laws, MD    Nurse/CMA: Charise Carwin MA  Allergies:  Allergies  Allergen Reactions   Misc. Sulfonamide Containing Compounds Anaphylaxis, Hives, Rash and Swelling    Swelling of the face and tongue , Swelling of the face and tongue   Strawberry Extract Other (See Comments), Itching and Rash    Unknown reaction  Unknown reaction, Unknown reaction, Unknown reaction   Sulfa Antibiotics Anaphylaxis, Hives and Swelling    Swelling of the face and tongue    Tegretol [Carbamazepine] Other (See Comments)    Suspected hepatotoxicity and pancytopenia, required inpatient admission 11/2021    Consent Signed: {yes IO:[id number]}  Is patient diabetic? {yes no:314532}  CBG today? ***  Pregnant: {yes no:314532} LMP: Patient's last menstrual period was 05/22/2012. (age 18-55)  Anticoagulants: {Yes/No:19989} Anti-inflammatory: {Yes/No:19989} Antibiotics: {Yes/No:19989}  Procedure: Epidural Steroid Injection Left L5  Position: Prone Start Time: ***  End Time: ***  Fluoro Time: ***  RN/CMA *** ***    Time *** ***    BP *** ***    Pulse *** ***    Respirations *** ***    O2 Sat *** ***    S/S *** ***    Pain Level *** ***     D/C home with ***, patient A & O X 3, D/C instructions reviewed, and sits independently.

## 2022-11-29 DIAGNOSIS — I10 Essential (primary) hypertension: Secondary | ICD-10-CM | POA: Insufficient documentation

## 2022-12-01 ENCOUNTER — Encounter: Payer: BC Managed Care – PPO | Admitting: Physical Medicine & Rehabilitation

## 2023-01-12 ENCOUNTER — Encounter: Payer: BC Managed Care – PPO | Admitting: Physical Medicine & Rehabilitation

## 2023-01-16 ENCOUNTER — Encounter: Payer: BC Managed Care – PPO | Admitting: Physical Medicine & Rehabilitation

## 2023-01-16 ENCOUNTER — Encounter: Payer: Self-pay | Admitting: Physical Medicine & Rehabilitation

## 2023-01-29 NOTE — Progress Notes (Signed)
Subjective:    Patient ID: Megan Reid, female    DOB: February 18, 1966, 57 y.o.   MRN: [id number]  HPI  Megan Reid is a 57 y.o. year old female  who  has a past medical history of Arthralgia (02/14/2016), Asthma, Autoimmune disease (HCC) (02/14/2016), DDD (degenerative disc disease), lumbar (02/14/2016), Fatigue (02/14/2016), Gastritis (02/14/2016), Hot flashes, Migraines (02/14/2016), Osteoarthritis of both hands (02/14/2016), Osteoarthritis of both knees (02/14/2016), and Vitamin D deficiency (02/14/2016).   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   Plan from last visit:  Toxic metabolic encephalopathy Anxiety state Getting better handle on stressors, doing well overall. 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 No longer on Buspar. Stop baclofen   Essential Hypertension Follow up with your PCP for blood pressure management   Lumbosacral radiculopathy at L5 Pain in left shin Follow up for EMG of left leg for ongoing numbness/tingling Follow up with me 2 weeks after procedure to review results Try capsaicin cream over the counter for nerve pain in your shin I recommend rescheduling appointment with Dr. Wynn Banker for injection I refilled your gabapentin   Interval Hx:  - Therapies: minimal Hep at this point.    - Follow ups: Was late for her EMG appointment, which was rescheduled to 10/31. No side effects with this.    - Falls:none   - WUJ:WJXB   - Medications: gabapentin 100 mg PRN, using approximately 2-3x per week with releif.    - Other concerns: Has been having issues with parking at work; she is able to get the walk from the parking lot to the classroom but it does make her short of breath.   Says she has been getting   Pain Inventory Average Pain 7 Pain Right Now 6 My pain is dull and tingling  In the last 24 hours, has pain interfered with the  following? General activity 3 Relation with others 3 Enjoyment of life 5 What TIME of day is your pain at its worst? evening Sleep (in general) Fair  Pain is worse with: walking Pain improves with: rest and therapy/exercise Relief from Meds:  fair  Family History  Problem Relation Age of Onset   Kidney failure Mother    Heart failure Mother    Lupus Mother    COPD Father    Hypertension Sister    Acromegaly Sister    Breast cancer Paternal Aunt 55   Social History   Socioeconomic History   Marital status: Married    Spouse name: Not on file   Number of children: 4   Years of education: 16   Highest education level: Not on file  Occupational History   Occupation: Runner, broadcasting/film/video  Tobacco Use   Smoking status: Former    Current packs/day: 0.00    Average packs/day: 0.5 packs/day for 2.0 years (1.0 ttl pk-yrs)    Types: Cigarettes    Start date: 05/16/1984    Quit date: 05/16/1986    Years since quitting: 36.7    Passive exposure: Never   Smokeless tobacco: Never   Tobacco comments:    TEEN AGER  Vaping Use   Vaping status: Never Used  Substance and Sexual Activity   Alcohol use: No   Drug use: No   Sexual activity: Not on file  Other Topics Concern   Not on file  Social History Narrative   Lives with husband.  Has 4 children.  Teacher.  Education: college.       Right Handed    Lives in a one story home    Drinks caffeine   Social Determinants of Health   Financial Resource Strain: Not on file  Food Insecurity: Not on file  Transportation Needs: Not on file  Physical Activity: Not on file  Stress: Not on file  Social Connections: Not on file   Past Surgical History:  Procedure Laterality Date   BREAST BIOPSY Left    ROTATOR CUFF REPAIR Right 06/16/2019   TUBAL LIGATION     Past Surgical History:  Procedure Laterality Date   BREAST BIOPSY Left    ROTATOR CUFF REPAIR Right 06/16/2019   TUBAL LIGATION     Past Medical History:  Diagnosis Date    Arthralgia 02/14/2016   Asthma    Autoimmune disease (HCC) 02/14/2016   Positive RF 139 CCP Negative  Positive ANA 1:640 Positive Ro Positive La Elevated ESR 105 - Sjorgen's Disease   DDD (degenerative disc disease), lumbar 02/14/2016   Fatigue 02/14/2016   Gastritis 02/14/2016   Hot flashes    Migraines 02/14/2016   Osteoarthritis of both hands 02/14/2016   Osteoarthritis of both knees 02/14/2016   Vitamin D deficiency 02/14/2016   LMP 05/22/2012   Opioid Risk Score:   Fall Risk Score:  `1  Depression screen [facility] 2/9     11/22/2022   10:30 AM 08/30/2022    3:48 PM 07/17/2022    3:35 PM 05/29/2022    3:43 PM 02/22/2022    9:33 AM 01/25/2022   11:28 AM 12/21/2021    1:01 PM  Depression screen PHQ 2/9  Decreased Interest 0 0 0 0 0 0 1  Down, Depressed, Hopeless 0 0 0 0 0 0 0  PHQ - 2 Score 0 0 0 0 0 0 1  Altered sleeping       2  Tired, decreased energy       3  Change in appetite       3  Feeling bad or failure about yourself        0  Trouble concentrating       2  Moving slowly or fidgety/restless       2  Suicidal thoughts       0  PHQ-9 Score       13    Review of Systems  Musculoskeletal:        Pain in both calves    All other systems reviewed and are negative.      Objective:   Physical Exam  PE: Constitution: Appropriate appearance for age. No apparent distress   Resp: No respiratory distress. No accessory muscle usage. on RA Cardio: Well perfused appearance. 1+ edema L>R ankles   Abdomen: Nondistended. Nontender.   Psych: Appropriate mood and affect. Neuro: AAOx4. No apparent cognitive deficits    Neurologic Exam:   DTRs: Reflexes were 2+ in bilateral achilles, patella, biceps, BR and triceps. Babinsky: flexor responses b/l.   Hoffmans: negative b/l Sensory exam: revealed normal sensation in all dermatomal regions in bilateral upper extremities; Bilateral lower extremities altered to light touch in the medial calf to medial malleolus, with absent  sensation to light touch on R 1st toe; sensation intact at 1st interweb space.   Motor exam: strength 5/5 throughout bilateral upper extremities and bilateral lower extremities Coordination: Fine motor coordination was normal.   Gait: normal      Assessment &  Plan:   Megan Reid is a 57 y.o. year old female  who  has a past medical history of Arthralgia (02/14/2016), Asthma, Autoimmune disease (HCC) (02/14/2016), DDD (degenerative disc disease), lumbar (02/14/2016), Fatigue (02/14/2016), Gastritis (02/14/2016), Hot flashes, Migraines (02/14/2016), Osteoarthritis of both hands (02/14/2016), Osteoarthritis of both knees (02/14/2016), and Vitamin D deficiency (02/14/2016).   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   Toxic metabolic encephalopathy Mild cognitive impairment Doing much better overall; work still providing some reasonable accomodations for cognitive fatigue and anxiety, sensory stimuli issues, but she is fully returned as a Runner, broadcasting/film/video and enjoys her work  Lumbosacral radiculopathy at L5 Pain in left shin  EMG with myself to be rescheduled sooner; has ESI schedule with Dr. Wynn Banker day after EMG study currently.   Symptoms increasing but patient wishes to continue current regimen of gabapentin 100 mg at bedtime PRN only.      Follow up with me 2 weeks after injection and EMG to review findings and assess effects.

## 2023-01-31 ENCOUNTER — Encounter: Payer: Self-pay | Admitting: Physical Medicine and Rehabilitation

## 2023-01-31 ENCOUNTER — Encounter
Payer: BC Managed Care – PPO | Attending: Physical Medicine and Rehabilitation | Admitting: Physical Medicine and Rehabilitation

## 2023-01-31 VITALS — BP 135/91 | HR 79 | Ht 70.0 in | Wt 186.0 lb

## 2023-01-31 DIAGNOSIS — R202 Paresthesia of skin: Secondary | ICD-10-CM | POA: Diagnosis present

## 2023-01-31 DIAGNOSIS — M5417 Radiculopathy, lumbosacral region: Secondary | ICD-10-CM | POA: Insufficient documentation

## 2023-01-31 DIAGNOSIS — M79662 Pain in left lower leg: Secondary | ICD-10-CM | POA: Diagnosis present

## 2023-01-31 DIAGNOSIS — G3184 Mild cognitive impairment, so stated: Secondary | ICD-10-CM | POA: Diagnosis present

## 2023-01-31 DIAGNOSIS — G609 Hereditary and idiopathic neuropathy, unspecified: Secondary | ICD-10-CM | POA: Insufficient documentation

## 2023-01-31 DIAGNOSIS — G928 Other toxic encephalopathy: Secondary | ICD-10-CM | POA: Diagnosis present

## 2023-02-07 ENCOUNTER — Encounter: Payer: Self-pay | Admitting: Physical Medicine and Rehabilitation

## 2023-02-07 ENCOUNTER — Encounter: Payer: BC Managed Care – PPO | Admitting: Physical Medicine and Rehabilitation

## 2023-02-07 VITALS — BP 148/100 | HR 81 | Ht 70.0 in | Wt 184.8 lb

## 2023-02-07 DIAGNOSIS — G928 Other toxic encephalopathy: Secondary | ICD-10-CM | POA: Diagnosis not present

## 2023-02-07 DIAGNOSIS — M5417 Radiculopathy, lumbosacral region: Secondary | ICD-10-CM

## 2023-02-07 DIAGNOSIS — R202 Paresthesia of skin: Secondary | ICD-10-CM

## 2023-02-07 DIAGNOSIS — G609 Hereditary and idiopathic neuropathy, unspecified: Secondary | ICD-10-CM

## 2023-02-12 ENCOUNTER — Ambulatory Visit: Payer: Self-pay | Admitting: Internal Medicine

## 2023-02-12 DIAGNOSIS — J309 Allergic rhinitis, unspecified: Secondary | ICD-10-CM

## 2023-02-12 NOTE — Progress Notes (Deleted)
Subjective:    Patient ID: Megan Reid, female    DOB: 02/06/1966, 57 y.o.   MRN: [id number]  HPI   PROCEDURE RECORD [facility]   Name: MAKAILA CRISAN DOB:[birth year] MRN: [id number]  Date:02/12/2023  Physician: Claudette Laws, MD    Nurse/CMA: Charise Carwin MA  Allergies:  Allergies  Allergen Reactions   Carbamazepine Other (See Comments)    Suspected hepatotoxicity and pancytopenia, required inpatient admission 11/2021  Liver and kidney failure   Misc. Sulfonamide Containing Compounds Anaphylaxis, Hives, Rash and Swelling    Swelling of the face and tongue , Swelling of the face and tongue   Other Hives, Itching, Shortness Of Breath and Swelling   Strawberry Extract Other (See Comments), Itching and Rash    Unknown reaction  Unknown reaction, Unknown reaction, Unknown reaction   Sulfa Antibiotics Anaphylaxis, Hives and Swelling    Swelling of the face and tongue     Consent Signed: {yes no:314532}  Is patient diabetic? {yes no:314532}  CBG today? ***  Pregnant: {yes no:314532} LMP: Patient's last menstrual period was 05/22/2012. (age [age])  Anticoagulants: {Yes/No:19989} Anti-inflammatory: {Yes/No:19989} Antibiotics: {Yes/No:19989}  Procedure: Epidural Steroid Injection  Position: Prone Start Time: ***  End Time: ***  Fluoro Time: ***  RN/CMA      Time      BP      Pulse      Respirations      O2 Sat      S/S      Pain Level       D/C home with ***, patient A & O X 3, D/C instructions reviewed, and sits independently.        Review of Systems     Objective:   Physical Exam        Assessment & Plan:

## 2023-02-13 DIAGNOSIS — G609 Hereditary and idiopathic neuropathy, unspecified: Secondary | ICD-10-CM | POA: Insufficient documentation

## 2023-02-13 NOTE — Progress Notes (Addendum)
Test Date:  02/07/2023  Patient: Megan Reid DOB: Jan 12, 1966 Physician: Dr. Elijah Birk  Sex: Female Height: ' " Ref Phys:   ID#: [id number] Weight:  lbs. Technician:    Patient Complaints: left leg numbness/swelling in anterior shin Some mild numbness/tingling in bilateral feet  Medications: gabapentin  Patient History / Exam: 5/5 strength throughout Sensory deficit over L anterolateral shin  NCV & EMG Findings: Evaluation of the right deep branch fibular (TA) motor nerve showed prolonged distal onset latency (Fib Head, 8.8 ms) and prolonged distal onset latency (Pop Fossa, 18.3 ms).  The right fibular (EDB) motor nerve showed no response (Ankle) and no response (Bel Fib Head).  The left fibular (EDB) motor nerve showed reduced amplitude (1.29 mV).  The left tibial (AHB) motor nerve showed reduced amplitude (3.2 mV) and decreased conduction velocity (28 m/s).  The right superficial fibular sensory, the right superficial fibular sensory, the right sural sensory, and the left sural sensory nerves showed no response.  All remaining nerves (as indicated in the following tables) were within normal limits.    Needle evaluation of the left tibialis anterior muscle showed increased insertional activity, slightly increased spontaneous activity, increased motor unit amplitude, increased motor unit duration, moderately increased polyphasic potentials, and diminished recruitment.  The left L5 paraspinal muscle showed increased insertional activity and slightly increased spontaneous activity.  All remaining muscles (as indicated in the following table) showed no evidence of electrical instability.    Impression: This an abnormal test. There is electrodiagnostic evidence of: Left L5 radiculopathy, with signs of active denervation Bilateral sensorimotor polyneuropathy   Recommendations:  Follow up with Dr. Wynn [facility]er for [facility] as planned Continue gabapentin for peripheral neuropathy; can consider  Qutenza in the future  ___________________________ Dr. Elijah Birk    Nerve Conduction Studies Motor Nerve Results    Latency Amplitude F-Lat Segment Distance CV Comment  Site (ms) Norm (mV) Norm (ms)  (cm) (m/s) Norm   Right Fibular (EDB)  Ankle *NR  < 6.1 *NR  > 2.0        Bel Fib Head *NR - *NR -  Bel Fib Head-Ankle - *NR  > 38   Right Fibular (Tib Anterior)  Fib Head *8.8  < 4.2 3.2 -        Pop Fossa *18.3  < 5.7 - -  Pop Fossa-Fib Head 14 15 -   Left Fibular (EDB)  Ankle 4.5  < 6.1 *1.29  > 2.0        Bel Fib Head 13.6 - 0.94 -  Bel Fib Head-Ankle 36 40  > 38   Pop Fossa 15.5 - 0.76 -  Pop Fossa-Bel Fib Head 9 47  > 42   Right Tibial (AHB)  Ankle 5.5  < 6.1 4.9  > 4.4        Knee 17.7 - 0.10 -  Knee-Ankle 44 36  > 35   Left Tibial (AHB)  Ankle 4.5  < 6.1 *3.2  > 4.4        Knee 19.4 - 0.20 -  Knee-Ankle 41 *28  > 35    Sensory Nerve Results    Latency (Peak) Amplitude (P-P) Segment Distance CV Comment  Site (ms) Norm (V) Norm  (cm) (m/s) Norm   Left Sural  Calf-Lat Mall *NR  < 4.0 *NR  > 5 Calf-Lat Mall 14 *NR  > 35   Right Sural  Calf-Lat Mall *NR  < 4.0 *NR  > 5 Calf-Lat Mall 14 *NR  >  35   Left Superficial Fibular  14 cm-Ankle *NR - *NR  > 5 14 cm-Ankle 14 *NR  > 32   Right Superficial Fibular  14 cm-Ankle *NR - *NR  > 5 14 cm-Ankle 14 *NR  > 32     Electromyography   Side Muscle Nerve Root Ins Act Fibs Psw Amp Dur Poly Recrt Int Pat Comment  Left Tib Anterior Fibular,  Deep Fibula... L4-L5 *Incr *1+ *1+ *Incr *>47ms *2+ *Reduced Nml   Left Gastroc Tibial S1-S2 Nml Nml Nml Nml Nml 0 Nml Nml   Left Vastus Med Femoral L2-L4 Nml Nml Nml Nml Nml 0 Nml Nml   Left Gluteus Med Sup Gluteal L5-S1 Nml Nml Nml Nml Nml 0 Nml Nml   Left L3 Parasp Rami L3 Nml Nml Nml        Left L5 Parasp Rami L5 *Incr Nml *1+        Right Tib Anterior Fibular,  Deep Fibula... L4-L5 Nml Nml Nml Nml Nml 0 Nml Nml   Right Gastroc Tibial S1-S2 Nml Nml Nml Nml Nml 0 Nml Nml   Right L3  Parasp Rami L3 Nml Nml Nml        Right L5 Parasp Rami L5 Nml Nml Nml           NCS Waveforms (note: left and right laterality mislabeled ):  Motor           Sensory

## 2023-02-14 ENCOUNTER — Ambulatory Visit: Payer: BC Managed Care – PPO | Admitting: Neurology

## 2023-02-14 ENCOUNTER — Ambulatory Visit: Payer: BC Managed Care – PPO | Admitting: Physical Medicine and Rehabilitation

## 2023-02-14 ENCOUNTER — Encounter: Payer: Self-pay | Admitting: Neurology

## 2023-02-14 VITALS — BP 157/94 | HR 71 | Ht 70.0 in | Wt 185.0 lb

## 2023-02-14 DIAGNOSIS — R202 Paresthesia of skin: Secondary | ICD-10-CM

## 2023-02-14 DIAGNOSIS — M5416 Radiculopathy, lumbar region: Secondary | ICD-10-CM | POA: Diagnosis not present

## 2023-02-14 NOTE — Patient Instructions (Signed)
Start physical therapy  I will see you back in 6 months

## 2023-02-14 NOTE — Progress Notes (Signed)
Follow-up Visit   Date: 02/14/23   Megan Reid MRN: [id number] DOB: [birth year]   Interim History: Megan Reid is a 57 y.o. right-handed female with asthma, Sjogren's disease, cytopenia, and lumbar canal stenosis  returning to the clinic with complaints of right facial pain and feet paresthesias.  The patient was accompanied to the clinic by self.  IMPRESSION/PLAN: Lumbar canal stenosis at L5-S1 causing bilateral feet paresthesias.  NCS/EMG of the legs from Jan 2023 shows chronic L5 radiculopathy, no evidence of neuropathy.  She had NCS/EMG repeated recently with PM&R, however I am unable to see those results to review.   Neurological exam does not have signs of neuropathy.  - Continue gabapentin 100mg  at bedtime  - Start physical therapy for low back strengthening  2.  Right trigeminal nerve pain secondary to localized nerve irritation due to proximity of the parotid gland secondary to Sjogren's disease.  Pain has significantly improved and she no longer has hyperesthesia over the face. Previously on carbamazepine 200mg  daily which was stopped due to hospitalization of acute hepatitis, acute renal failure, and encephaloathy  - Continue gabapentin 100mg  at bedtime as needed  3.  Bilateral medial leg pain and erythema is not related to primary neurological condition.   Return to clinic in 6 months  -------------------------------------------------------------- History of present illness:  In June 2023, she began having right side ear pain and diagnosed with swimmer's ear. She has numbness over the right side over the cheek, jaw, upper and lower lips. No pain involving the nose or forehead.  She complains of skin hypersensitivity on the right cheek.  She denies shooting pain over the right side of the face. She was treated with antibiotics which would temporarily relieve her pain and discomfort.  She saw ENT who ordered CT which showed heterogeneity of the parotid and  submandibular glands, consistent with Sjogren's syndrome.  She has been treated with several cycles of antibiotics and steroids, which resolves symptoms when she is taking it, but once the course has been completed, her right parotid fullness and facial pain returned.   UPDATE 04/28/2022:  At her last visit in August, she was started on carbamazpine for her facial pain.  A month later, she was hospitalized with acute hepatitis, encephalopathy, and pancytopenia thought to be carbamazepine-induced.  She was briefly in the ICU and treated supportively after which she was discharged to rehab. She is here because her bilateral feet tingling and pain has became worse since her hospitalization. She also continues to have low back pain.  She was given gabapentin 100mg  at bedtime, but has not started this until she was seen by me. She continues to have right jaw sensitivity.    UPDATE 08/09/2022:  She started PT which has helped with feet sensation and low back pain.  Right facial pain improved so now she takes gabapentin 100mg  only as needed, usually 2-3 times per week. Overall, she has been doing better than before.   Recently, she has been having left lower leg pain, soreness, and tenderness.  She is scheduled to have US of the leg to evaluate for DVT.   UPDATE 02/14/2023:  She is here for follow-up.  She is complaining of tenderness and warmth in the medial lower legs. She also notes redness over the same area.  She continues to have numbness in the feet and had NCS/EMG of the legs with PM&R.  Results are not available for review.  She was not contact to do PT at  her last visit and would like to restart this.  She reports numbness in the feet, worse in the right foot.    Medications:  Current Outpatient Medications on File Prior to Visit  Medication Sig Dispense Refill   albuterol (PROVENTIL HFA;VENTOLIN HFA) 108 (90 Base) MCG/ACT inhaler Inhale 2 puffs into the lungs 2 (two) times daily. For shortness of  breath 18 g 0   busPIRone (BUSPAR) 5 MG tablet Take 1 tablet (5 mg total) by mouth 2 (two) times daily. (Patient taking differently: Take 5 mg by mouth 2 (two) times daily. prn) 60 tablet 3   estradiol (ESTRACE) 0.1 MG/GM vaginal cream Place vaginally.     fluticasone-salmeterol (ADVAIR) 100-50 MCG/ACT AEPB Inhale into the lungs.     gabapentin (NEURONTIN) 100 MG capsule Take 1 capsule (100 mg total) by mouth daily. 90 capsule 5   hydrochlorothiazide (HYDRODIURIL) 12.5 MG tablet Take 12.5 mg by mouth every morning.     hydrOXYzine (ATARAX) 10 MG tablet Take 1 tablet (10 mg total) by mouth 3 (three) times daily as needed for anxiety. 30 tablet 0   levalbuterol (XOPENEX HFA) 45 MCG/ACT inhaler Inhale into the lungs.     Magnesium 400 MG TABS 2 tablets Orally Twice a day     melatonin 5 MG TABS Take 1 tablet (5 mg total) by mouth at bedtime as needed. 30 tablet 0   montelukast (SINGULAIR) 10 MG tablet take 1 tablet by mouth every day in the evening by mouth once a day for 90 days     Multiple Vitamin (MULTIVITAMIN WITH MINERALS) TABS tablet Take 1 tablet by mouth daily.     naproxen (NAPROSYN) 500 MG tablet      pantoprazole (PROTONIX) 40 MG tablet Take 1 tablet (40 mg total) by mouth daily. 30 tablet 5   SYMBICORT 160-4.5 MCG/ACT inhaler INHALE 2 PUFFS INTO THE LUNGS TWICE DAILY (Patient taking differently: Inhale 2 puffs into the lungs in the morning and at bedtime.) 10.2 g 5   TEZSPIRE 210 MG/1. SOAJ Inject into the skin.     triamcinolone (NASACORT) 55 MCG/ACT AERO nasal inhaler Place 1 spray into the nose 2 (two) times daily as needed (nasal symptoms). 1 each 5   valACYclovir (VALTREX) 1000 MG tablet Take 1,000 mg by mouth daily.     No current facility-administered medications on file prior to visit.    Allergies:  Allergies  Allergen Reactions   Carbamazepine Other (See Comments)    Suspected hepatotoxicity and pancytopenia, required inpatient admission 11/2021  Liver and kidney  failure   Misc. Sulfonamide Containing Compounds Anaphylaxis, Hives, Rash and Swelling    Swelling of the face and tongue , Swelling of the face and tongue   Other Hives, Itching, Shortness Of Breath and Swelling   Strawberry Extract Other (See Comments), Itching and Rash    Unknown reaction  Unknown reaction, Unknown reaction, Unknown reaction   Sulfa Antibiotics Anaphylaxis, Hives and Swelling    Swelling of the face and tongue     Vital Signs:  BP (!) 157/94   Pulse 71   Ht 5\' 10"  (1.778 m)   Wt 185 lb (83.9 kg)   LMP 05/22/2012   SpO2 100%   BMI 26.54 kg/m   Neurological Exam: MENTAL STATUS including orientation to time, place, person, recent and remote memory, attention span and concentration, language, and fund of knowledge is normal.  Speech is not dysarthric.  CRANIAL NERVES:  Pupils equal round and reactive to light.  Normal conjugate, extra-ocular eye movements in all directions of gaze.  No ptosis. Sensation is intact.  Palate elevates symmetrically.  Tongue is midline.  MOTOR:  Motor strength is 5/5 in all extremities.  No atrophy, fasciculations or abnormal movements.  No pronator drift.  Tone is normal.    MSRs:  Reflexes are 2+/4 throughout, except 3+/4 at bilateral patellas  SENSORY:  Intact to vibration throughout. Rhomberg testing is negative.   COORDINATION/GAIT:  Normal finger-to- nose-finger. Gait narrow based and stable. Tandem gait intact.   Data: MRI lumbar spine wo contrast 04/08/2021: No change demonstrated since the study of 2015. Markedly exaggerated lumbosacral lordosis. Chronic degenerative anterolisthesis at L5-S1 of 8 mm. Note that lumbosacral anatomy is transitional, and the same numbering terminology was used as on the previous examination.   L3-4: Disc bulge. Mild facet and ligamentous hypertrophy. Mild lateral recess narrowing without visible neural compression.   L4-5: Disc bulge. Facet and ligamentous hypertrophy. Mild stenosis of  both lateral recesses but without visible neural compression.   L5-S1: Chronic degenerative anterolisthesis of 8 mm. Chronic disc degeneration with pseudo disc herniation. Stenosis of the canal and neural foramina which could possibly cause neural compression.  CT soft tissue neck w contrast 11/08/2021: 1. Heterogeneity of the parotid and submandibular glands, consistent with the patient's diagnosis of Sjogren syndrome, without sialolithiasis or evidence of inflammatory changes or ductal dilatation to suggest sialadenitis. 2. 1.5 cm hypoenhancing nodule in the left thyroid lobe, previously evaluated with ultrasound on 05/12/2021.  NCS/EMG of the legs 05/05/2021: Chronic L5 radiculopathy affecting the right lower extremity, mild-to-moderate. There has been mild improvement as compared to prior study on 04/24/2017.   In particular, there is no evidence of a sensorimotor polyneuropathy affecting the lower extremities.     Thank you for allowing me to participate in patient's care.  If I can answer any additional questions, I would be pleased to do so.    Sincerely,    Lavon Bothwell K. Allena Katz, DO

## 2023-02-15 ENCOUNTER — Encounter: Payer: BC Managed Care – PPO | Admitting: Physical Medicine & Rehabilitation

## 2023-02-22 ENCOUNTER — Ambulatory Visit: Payer: BC Managed Care – PPO | Admitting: Physical Medicine & Rehabilitation

## 2023-02-28 ENCOUNTER — Encounter: Payer: BC Managed Care – PPO | Admitting: Physical Medicine and Rehabilitation

## 2023-03-05 ENCOUNTER — Ambulatory Visit: Payer: BC Managed Care – PPO | Attending: Neurology | Admitting: Physical Therapy

## 2023-04-26 ENCOUNTER — Encounter: Payer: Self-pay | Admitting: Physical Medicine & Rehabilitation

## 2023-04-26 ENCOUNTER — Encounter: Payer: 59 | Attending: Physical Medicine and Rehabilitation | Admitting: Physical Medicine & Rehabilitation

## 2023-04-26 VITALS — BP 143/89 | HR 75 | Temp 98.0°F | Ht 70.0 in | Wt 178.0 lb

## 2023-04-26 DIAGNOSIS — M5417 Radiculopathy, lumbosacral region: Secondary | ICD-10-CM | POA: Diagnosis not present

## 2023-04-26 MED ORDER — LIDOCAINE HCL (PF) 1 % IJ SOLN
2.0000 mL | Freq: Once | INTRAMUSCULAR | Status: AC
  Administered 2023-04-26: 2 mL

## 2023-04-26 MED ORDER — LIDOCAINE HCL 1 % IJ SOLN
5.0000 mL | Freq: Once | INTRAMUSCULAR | Status: AC
  Administered 2023-04-26: 5 mL

## 2023-04-26 MED ORDER — DEXAMETHASONE SODIUM PHOSPHATE 10 MG/ML IJ SOLN
10.0000 mg | Freq: Once | INTRAMUSCULAR | Status: AC
  Administered 2023-04-26: 10 mg via INTRAVENOUS

## 2023-04-26 MED ORDER — IOHEXOL 180 MG/ML  SOLN
3.0000 mL | Freq: Once | INTRAMUSCULAR | Status: AC
  Administered 2023-04-26: 3 mL via INTRAVENOUS

## 2023-04-26 NOTE — Progress Notes (Signed)
  PROCEDURE RECORD [facility]   Name: Megan Reid DOB:01-09-1966 MRN: [id number]  Date:04/26/2023  Physician: Prentice Compton, MD    Nurse/CMA: Vastie Douty RMA  Allergies:  Allergies  Allergen Reactions   Carbamazepine  Other (See Comments)    Suspected hepatotoxicity and pancytopenia, required inpatient admission 11/2021  Liver and kidney failure   Misc. Sulfonamide Containing Compounds Anaphylaxis, Hives, Rash and Swelling    Swelling of the face and tongue , Swelling of the face and tongue   Other Hives, Itching, Shortness Of Breath and Swelling   Strawberry Extract Other (See Comments), Itching and Rash    Unknown reaction  Unknown reaction, Unknown reaction, Unknown reaction   Sulfa Antibiotics Anaphylaxis, Hives and Swelling    Swelling of the face and tongue     Consent Signed: Yes.    Is patient diabetic? No.  CBG today? .  Pregnant: No. LMP: Patient's last menstrual period was 05/22/2012. (age 61-55)  Anticoagulants: no Anti-inflammatory: no Antibiotics: no  Procedure: Epidural Steroid Injection  Position: Prone Start Time: 10:04 AM  End Time: 10:10AM  Fluoro Time: 36  RN/CMA Locke Barrell RMA Honestii Marton RMA    Time 9:41 10:13 AM    BP 160/110 166/107    Pulse 75 70    Respirations 16 16    O2 Sat 97% 99    S/S 6 6    Pain Level 6/10 2/10     D/C home with Husband, patient A & O X 3, D/C instructions reviewed, and sits independently.

## 2023-04-26 NOTE — Patient Instructions (Signed)

## 2023-04-26 NOTE — Progress Notes (Signed)
 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 Retroneural approach used due to significant spondylolisthesis at L5-S1 and marked lumbar lordosis  Informed consent was obtained after describing risk and benefits of the procedure with the patient, this includes bleeding, bruising, infection, paralysis and medication side effects.  The patient wishes to proceed and has given written consent.  Patient was placed in prone position.  The lumbar area was marked and prepped with Betadine.  It was entered with a 25-gauge 1-1/2 inch needle and one mL of 1% lidocaine  was injected into the skin and subcutaneous tissue.  Then a 22-gauge 3.5 in spinal needle was inserted into the Left L4-5 inferior intervertebral foramen under AP, lateral, and oblique view.  Once needle tip was within the foramen and retroneural on lateral views and not exceeding 6 o clock position on th epedical on AP viewed Isovue  200 was inected x 2ml Then a solution containing one mL of 10 mg per mL dexamethasone  and 2 mL of 1% lidocaine  was injected.  The patient tolerated procedure well.  Post procedure instructions were given.  Please see post procedure form.

## 2023-05-14 ENCOUNTER — Encounter: Payer: 59 | Admitting: Physical Medicine and Rehabilitation

## 2023-05-21 ENCOUNTER — Encounter: Payer: Self-pay | Admitting: Pulmonary Disease

## 2023-05-24 ENCOUNTER — Ambulatory Visit
Admission: RE | Admit: 2023-05-24 | Discharge: 2023-05-24 | Disposition: A | Discharge status: Home / Self Care | Payer: 59 | Source: Ambulatory Visit | Attending: Pulmonary Disease | Admitting: Pulmonary Disease

## 2023-05-24 ENCOUNTER — Other Ambulatory Visit (HOSPITAL_COMMUNITY): Payer: 59

## 2023-05-24 DIAGNOSIS — R911 Solitary pulmonary nodule: Secondary | ICD-10-CM

## 2023-05-25 ENCOUNTER — Other Ambulatory Visit: Payer: 59

## 2023-05-28 ENCOUNTER — Other Ambulatory Visit: Payer: Self-pay | Admitting: Allergy

## 2023-06-08 ENCOUNTER — Telehealth: Payer: Self-pay | Admitting: *Deleted

## 2023-06-08 DIAGNOSIS — R911 Solitary pulmonary nodule: Secondary | ICD-10-CM

## 2023-06-08 DIAGNOSIS — R590 Localized enlarged lymph nodes: Secondary | ICD-10-CM

## 2023-06-08 NOTE — Telephone Encounter (Signed)
 I am glad you are stable with your breathing Your PET scan does not show any findings of concern Will continue to follow the small lung nodules Order follow-up high-res CT in 1 year Return to clinic in 6 months        CT has been ordered.  PCC's, please notify front once CT has been schedule so f/u appt can be scheduled. Thanks

## 2023-06-08 NOTE — Telephone Encounter (Signed)
 Last OV on 05/26/2022 for follow up in one year after CT Chest.   Order for CT Chest needs to be placed as there is no active order requests.  Please place order. Routed to Triage and [facility] teams so that patient can get CT scheduled. Front desk can call to schedule follow up OV.  Thank you!

## 2023-06-11 ENCOUNTER — Encounter (HOSPITAL_BASED_OUTPATIENT_CLINIC_OR_DEPARTMENT_OTHER): Payer: Self-pay

## 2023-06-11 ENCOUNTER — Telehealth: Payer: Self-pay | Admitting: Pulmonary Disease

## 2023-06-11 ENCOUNTER — Observation Stay (HOSPITAL_BASED_OUTPATIENT_CLINIC_OR_DEPARTMENT_OTHER)
Admission: EM | Admit: 2023-06-11 | Discharge: 2023-06-12 | Disposition: A | Discharge status: Home / Self Care | Payer: 59 | Attending: Internal Medicine | Admitting: Internal Medicine

## 2023-06-11 ENCOUNTER — Other Ambulatory Visit: Payer: Self-pay

## 2023-06-11 ENCOUNTER — Emergency Department (HOSPITAL_BASED_OUTPATIENT_CLINIC_OR_DEPARTMENT_OTHER): Payer: 59

## 2023-06-11 DIAGNOSIS — Z7901 Long term (current) use of anticoagulants: Secondary | ICD-10-CM | POA: Diagnosis not present

## 2023-06-11 DIAGNOSIS — Z1152 Encounter for screening for COVID-19: Secondary | ICD-10-CM | POA: Insufficient documentation

## 2023-06-11 DIAGNOSIS — R0600 Dyspnea, unspecified: Secondary | ICD-10-CM

## 2023-06-11 DIAGNOSIS — R0789 Other chest pain: Principal | ICD-10-CM

## 2023-06-11 DIAGNOSIS — J45901 Unspecified asthma with (acute) exacerbation: Secondary | ICD-10-CM | POA: Diagnosis not present

## 2023-06-11 DIAGNOSIS — Z79899 Other long term (current) drug therapy: Secondary | ICD-10-CM | POA: Insufficient documentation

## 2023-06-11 DIAGNOSIS — I7121 Aneurysm of the ascending aorta, without rupture: Secondary | ICD-10-CM | POA: Insufficient documentation

## 2023-06-11 DIAGNOSIS — J9601 Acute respiratory failure with hypoxia: Principal | ICD-10-CM | POA: Diagnosis present

## 2023-06-11 LAB — BASIC METABOLIC PANEL
Anion gap: 7 (ref 5–15)
BUN: 13 mg/dL (ref 6–20)
CO2: 22 mmol/L (ref 22–32)
Calcium: 9.4 mg/dL (ref 8.9–10.3)
Chloride: 104 mmol/L (ref 98–111)
Creatinine, Ser: 0.91 mg/dL (ref 0.44–1.00)
GFR, Estimated: >60 mL/min (ref >60)
Glucose, Bld: 101 mg/dL — ABNORMAL HIGH (ref 70–99)
Potassium: 3.6 mmol/L (ref 3.5–5.1)
Sodium: 133 mmol/L — ABNORMAL LOW (ref 135–145)

## 2023-06-11 LAB — CBC
HCT: 35.2 % — ABNORMAL LOW (ref 36.0–46.0)
Hemoglobin: 11.4 g/dL — ABNORMAL LOW (ref 12.0–15.0)
MCH: 27.9 pg (ref 26.0–34.0)
MCHC: 32.4 g/dL (ref 30.0–36.0)
MCV: 86.3 fL (ref 80.0–100.0)
Platelets: 111 10*3/uL — ABNORMAL LOW (ref 150–400)
RBC: 4.08 MIL/uL (ref 3.87–5.11)
RDW: 14.7 % (ref 11.5–15.5)
WBC: 4.6 10*3/uL (ref 4.0–10.5)
nRBC: 0 % (ref 0.0–0.2)

## 2023-06-11 LAB — HEPATIC FUNCTION PANEL
ALT: 18 U/L (ref 0–44)
AST: 36 U/L (ref 15–41)
Albumin: 3.1 g/dL — ABNORMAL LOW (ref 3.5–5.0)
Alkaline Phosphatase: 50 U/L (ref 38–126)
Bilirubin, Direct: 0.2 mg/dL (ref 0.0–0.2)
Indirect Bilirubin: 0.6 mg/dL (ref 0.3–0.9)
Total Bilirubin: 0.8 mg/dL (ref 0.0–1.2)
Total Protein: 10.6 g/dL — ABNORMAL HIGH (ref 6.5–8.1)

## 2023-06-11 LAB — URINALYSIS, MICROSCOPIC (REFLEX)

## 2023-06-11 LAB — LIPASE, BLOOD: Lipase: 32 U/L (ref 11–51)

## 2023-06-11 LAB — URINALYSIS, ROUTINE W REFLEX MICROSCOPIC
Bilirubin Urine: NEGATIVE
Glucose, UA: NEGATIVE mg/dL
Ketones, ur: NEGATIVE mg/dL
Nitrite: NEGATIVE
Protein, ur: 30 mg/dL — AB
Specific Gravity, Urine: 1.015 (ref 1.005–1.030)
pH: 5.5 (ref 5.0–8.0)

## 2023-06-11 LAB — RESP PANEL BY RT-PCR (RSV, FLU A&B, COVID)  RVPGX2
Influenza A by PCR: NEGATIVE
Influenza B by PCR: NEGATIVE
Resp Syncytial Virus by PCR: NEGATIVE
SARS Coronavirus 2 by RT PCR: NEGATIVE

## 2023-06-11 LAB — C-REACTIVE PROTEIN: CRP: 3.9 mg/dL — ABNORMAL HIGH (ref <1.0)

## 2023-06-11 LAB — TROPONIN I (HIGH SENSITIVITY)
Troponin I (High Sensitivity): 17 ng/L (ref <18)
Troponin I (High Sensitivity): 18 ng/L — ABNORMAL HIGH (ref <18)
Troponin I (High Sensitivity): 15 ng/L (ref <18)

## 2023-06-11 LAB — LACTIC ACID, PLASMA
Lactic Acid, Venous: 1 mmol/L (ref 0.5–1.9)
Lactic Acid, Venous: 0.8 mmol/L (ref 0.5–1.9)

## 2023-06-11 LAB — SEDIMENTATION RATE: Sed Rate: 117 mm/h — ABNORMAL HIGH (ref 0–22)

## 2023-06-11 LAB — BRAIN NATRIURETIC PEPTIDE: B Natriuretic Peptide: 162.5 pg/mL — ABNORMAL HIGH (ref 0.0–100.0)

## 2023-06-11 MED ORDER — IOHEXOL 350 MG/ML SOLN
100.0000 mL | Freq: Once | INTRAVENOUS | Status: AC | PRN
  Administered 2023-06-11: 75 mL via INTRAVENOUS

## 2023-06-11 MED ORDER — METHYLPREDNISOLONE SODIUM SUCC 125 MG IJ SOLR
125.0000 mg | Freq: Once | INTRAMUSCULAR | Status: AC
Start: 2023-06-11 — End: 2023-06-11
  Administered 2023-06-11: 125 mg via INTRAVENOUS
  Filled 2023-06-11: qty 2

## 2023-06-11 MED ORDER — HYDROMORPHONE HCL 1 MG/ML IJ SOLN
1.0000 mg | Freq: Once | INTRAMUSCULAR | Status: AC
  Administered 2023-06-11: 1 mg via INTRAVENOUS
  Filled 2023-06-11: qty 1

## 2023-06-11 MED ORDER — ALBUTEROL SULFATE (2.5 MG/3ML) 0.083% IN NEBU
2.5000 mg | INHALATION_SOLUTION | Freq: Four times a day (QID) | RESPIRATORY_TRACT | Status: DC
Start: 2023-06-11 — End: 2023-06-12
  Administered 2023-06-11 – 2023-06-12 (×3): 2.5 mg via RESPIRATORY_TRACT
  Filled 2023-06-11 (×3): qty 3

## 2023-06-11 MED ORDER — ALBUTEROL SULFATE (2.5 MG/3ML) 0.083% IN NEBU: 2.5000 mg | INHALATION_SOLUTION | RESPIRATORY_TRACT | Status: DC | PRN

## 2023-06-11 MED ORDER — ASPIRIN 81 MG PO CHEW
324.0000 mg | CHEWABLE_TABLET | Freq: Once | ORAL | Status: AC
  Administered 2023-06-11: 324 mg via ORAL
  Filled 2023-06-11: qty 4

## 2023-06-11 MED ORDER — ONDANSETRON HCL 4 MG/2ML IJ SOLN
4.0000 mg | Freq: Once | INTRAMUSCULAR | Status: AC
  Administered 2023-06-11: 4 mg via INTRAVENOUS
  Filled 2023-06-11: qty 2

## 2023-06-11 MED ORDER — FENTANYL CITRATE PF 50 MCG/ML IJ SOSY
50.0000 ug | PREFILLED_SYRINGE | Freq: Once | INTRAMUSCULAR | Status: AC
  Administered 2023-06-11: 50 ug via INTRAVENOUS
  Filled 2023-06-11: qty 1

## 2023-06-11 MED ORDER — SODIUM CHLORIDE 0.9 % IV SOLN: Freq: Once | INTRAVENOUS | Status: AC

## 2023-06-11 MED ORDER — AZITHROMYCIN 250 MG PO TABS
500.0000 mg | ORAL_TABLET | Freq: Once | ORAL | Status: AC
  Administered 2023-06-11: 500 mg via ORAL
  Filled 2023-06-11: qty 2

## 2023-06-11 MED ORDER — ALBUTEROL SULFATE HFA 108 (90 BASE) MCG/ACT IN AERS
4.0000 | INHALATION_SPRAY | Freq: Once | RESPIRATORY_TRACT | Status: AC
  Administered 2023-06-11: 4 via RESPIRATORY_TRACT
  Filled 2023-06-11: qty 6.7

## 2023-06-11 NOTE — ED Triage Notes (Signed)
 Pt reports chest pain that started on Saturday. Reports that the pain has gotten worse. Today she feels pressure and like someone is on her chest. Pt reports cough, congestion, runny nose and shortness of breath at times especially with walking.

## 2023-06-11 NOTE — ED Provider Notes (Signed)
 [facility] Provider Note   CSN: [id number] Arrival date & time: 06/11/23  1119     History  Chief Complaint  Patient presents with   Chest Pain    Megan Reid is a 58 y.o. female.  HPI Patient had traveled to [place].  She reports that she was outside and there was a very cold wind.  She reports that she got chest pain on Saturday that was fairly intense and over her anterior central chest.  She attributed it to the cold air and her asthma.  She reports she went home took an inhaler and felt somewhat better.  She reports that she was able to sleep through the night and the next morning she still felt somewhat uncomfortable but not too severe.  She reports however since yesterday, the pain has been increasing and she has pressure all over her anterior chest that feels like an elephant sitting on it.  She reports it is hard for her to lie flat.  She is uncomfortable with movements and deep breaths.  She feels increased pain with deep breath is diffuse in her chest.  She denies radiation into her back shoulders or arms or jaw.  Patient reports she has had some chills and some minor cough.  No documented fever.  She had her recent airplane flight home.  Patient reports when she got home and bent over her luggage as she felt very nauseated and lightheaded.  She reports she has had nausea but has not had any active vomiting or abdominal pain.  She has had some swelling around her ankles although she notes that to be more of an intermittent persistent situation.  Patient does have history of Sjogren's and rheumatoid arthritis.  No personal history of coronary artery disease or PE.  She reports she has developed some pulmonary nodules that are being monitored but not diagnosed as malignant.    Home Medications Prior to Admission medications   Medication Sig Start Date End Date Taking? Authorizing Provider  albuterol (PROVENTIL HFA;VENTOLIN HFA) 108  (90 Base) MCG/ACT inhaler Inhale 2 puffs into the lungs 2 (two) times daily. For shortness of breath 04/15/18   Reubin Milan, MD  busPIRone (BUSPAR) 5 MG tablet Take 1 tablet (5 mg total) by mouth 2 (two) times daily. Patient taking differently: Take 5 mg by mouth 2 (two) times daily. prn 05/29/22   Angelina Sheriff, DO  estradiol (ESTRACE) 0.1 MG/GM vaginal cream Place vaginally. 01/08/23   [provider]  fluticasone-salmeterol (ADVAIR) 100-50 MCG/ACT AEPB Inhale into the lungs. 02/13/17   [provider]  gabapentin (NEURONTIN) 100 MG capsule Take 1 capsule (100 mg total) by mouth daily. 11/22/22   Angelina Sheriff, DO  gabapentin (NEURONTIN) 100 MG capsule Take 1 capsule by mouth daily. 11/22/22   [provider]  hydrochlorothiazide (HYDRODIURIL) 12.5 MG tablet Take 12.5 mg by mouth every morning. 05/14/22   [provider]  hydrOXYzine (ATARAX) 10 MG tablet Take 1 tablet (10 mg total) by mouth 3 (three) times daily as needed for anxiety. 12/16/21   Love, Evlyn Kanner, PA-C  levalbuterol (XOPENEX HFA) 45 MCG/ACT inhaler Inhale into the lungs. 04/13/18   [provider]  Magnesium 400 MG TABS 2 tablets Orally Twice a day    [provider]  melatonin 5 MG TABS Take 1 tablet (5 mg total) by mouth at bedtime as needed. 12/16/21   Love, Evlyn Kanner, PA-C  montelukast (SINGULAIR)  10 MG tablet take 1 tablet by mouth every day in the evening by mouth once a day for 90 days    [provider]  Multiple Vitamin (MULTIVITAMIN WITH MINERALS) TABS tablet Take 1 tablet by mouth daily. 12/17/21   Love, Evlyn Kanner, PA-C  mupirocin ointment (BACTROBAN) 2 % Apply in nose 3 times daily 02/05/23   [provider]  naproxen (NAPROSYN) 500 MG tablet  07/13/22   [provider]  pantoprazole (PROTONIX) 40 MG tablet Take 1 tablet (40 mg total) by mouth daily. 06/27/21   Ellamae Sia, DO  SYMBICORT 160-4.5 MCG/ACT inhaler INHALE 2 PUFFS INTO THE LUNGS TWICE  DAILY Patient taking differently: Inhale 2 puffs into the lungs in the morning and at bedtime. 04/16/19   Reubin Milan, MD  TEZSPIRE 210 MG/1. SOAJ Inject into the skin. 04/28/22   [provider]  triamcinolone (NASACORT) 55 MCG/ACT AERO nasal inhaler Place 1 spray into the nose 2 (two) times daily as needed (nasal symptoms). 03/08/22   Ellamae Sia, DO  valACYclovir (VALTREX) 1000 MG tablet Take 1,000 mg by mouth daily. 04/05/22   [provider]      Allergies    Carbamazepine, Misc. sulfonamide containing compounds, Other, Strawberry extract, and Sulfa antibiotics    Review of Systems   Review of Systems  Physical Exam Updated Vital Signs BP (!) 157/102   Pulse 93   Temp 98.1 F (36.7 C) (Oral)   Resp 20   Ht 5\' 9"  (1.753 m)   Wt 83 kg   LMP 05/22/2012   SpO2 100%   BMI 27.02 kg/m  Physical Exam Constitutional:      Comments: Alert nontoxic.  She appears uncomfortable.  Mental status is clear.  No respiratory distress at rest.  Well-nourished well-developed.  HENT:     Head: Normocephalic and atraumatic.     Mouth/Throat:     Mouth: Mucous membranes are moist.     Pharynx: Oropharynx is clear.  Eyes:     Extraocular Movements: Extraocular movements intact.     Pupils: Pupils are equal, round, and reactive to light.  Cardiovascular:     Rate and Rhythm: Normal rate and regular rhythm.     Comments: Borderline tachycardia. Pulmonary:     Effort: Pulmonary effort is normal.     Breath sounds: Normal breath sounds.  Chest:     Chest wall: No tenderness.  Abdominal:     General: There is no distension.     Palpations: Abdomen is soft.     Tenderness: There is no abdominal tenderness. There is no guarding.  Musculoskeletal:        General: No swelling. Normal range of motion.     Right lower leg: No edema.     Left lower leg: No edema.     Comments: Patient endorses some tenderness to the posterior aspect of the lower leg on the right.  No  notable swelling or edema.  The calves are soft and pliable.  Skin:    General: Skin is warm and dry.  Neurological:     General: No focal deficit present.     Mental Status: She is oriented to person, place, and time.     Motor: No weakness.     Coordination: Coordination normal.  Psychiatric:        Mood and Affect: Mood normal.     ED Results / Procedures / Treatments   Labs (all labs ordered are listed, but  only abnormal results are displayed) Labs Reviewed  CBC - Abnormal; Notable for the following components:      Result Value   Hemoglobin 11.4 (*)    HCT 35.2 (*)    Platelets 111 (*)    All other components within normal limits  HEPATIC FUNCTION PANEL - Abnormal; Notable for the following components:   Total Protein 10.6 (*)    Albumin 3.1 (*)    All other components within normal limits  BRAIN NATRIURETIC PEPTIDE - Abnormal; Notable for the following components:   B Natriuretic Peptide 162.5 (*)    All other components within normal limits  SEDIMENTATION RATE - Abnormal; Notable for the following components:   Sed Rate 117 (*)    All other components within normal limits  BASIC METABOLIC PANEL - Abnormal; Notable for the following components:   Sodium 133 (*)    Glucose, Bld 101 (*)    All other components within normal limits  TROPONIN I (HIGH SENSITIVITY) - Abnormal; Notable for the following components:   Troponin I (High Sensitivity) 18 (*)    All other components within normal limits  RESP PANEL BY RT-PCR (RSV, FLU A&B, COVID)  [id number]  LACTIC ACID, PLASMA  LACTIC ACID, PLASMA  LIPASE, BLOOD  URINALYSIS, ROUTINE W REFLEX MICROSCOPIC  C-REACTIVE PROTEIN  TROPONIN I (HIGH SENSITIVITY)    EKG EKG Interpretation Date/Time:  Monday June 11 2023 11:31:17 EST Ventricular Rate:  91 PR Interval:  153 QRS Duration:  86 QT Interval:  368 QTC Calculation: 453 R Axis:   18  Text Interpretation: Sinus rhythm Abnormal R-wave progression, early transition  agree, no acute ischemic appearance.  compared to previous, normalization of slurred ST segment Confirmed by Arby Barrette [phone]) on 06/11/2023 11:36:06 AM  Radiology DG Chest 2 View Result Date: 06/11/2023 CLINICAL DATA:  Chest pain EXAM: CHEST - 2 VIEW COMPARISON:  12/04/2021 CT 05/24/2023 FINDINGS: Normal cardiac silhouette. New bibasilar airspace densities greater on the LEFT. Small effusions. Upper lungs clear. No pneumothorax. IMPRESSION: Concern for bibasilar pneumonia Electronically Signed   By: Genevive Bi M.D.   On: 06/11/2023 13:38    Procedures Procedures    Medications Ordered in ED Medications  HYDROmorphone (DILAUDID) injection 1 mg (1 mg Intravenous Given 06/11/23 1248)  ondansetron (ZOFRAN) injection 4 mg (4 mg Intravenous Given 06/11/23 1248)  aspirin chewable tablet 324 mg (324 mg Oral Given 06/11/23 1241)  0.9 %  sodium chloride infusion ( Intravenous New Bag/Given 06/11/23 1247)  iohexol (OMNIPAQUE) 350 MG/ML injection 100 mL (75 mLs Intravenous Contrast Given 06/11/23 1429)    ED Course/ Medical Decision Making/ A&P                                 Medical Decision Making Amount and/or Complexity of Data Reviewed Labs: ordered. Radiology: ordered.  Risk OTC drugs. Prescription drug management.   Presents as outlined, patient chest pain shortness of breath.  Patient has had recent travel and airflight.  Differential diagnosis includes PE\ACS\pulmonary infection\pancreatitis or other subdiaphragmatic process.  Will proceed with broad diagnostic evaluation.  EKG reviewed by myself shows no acute ischemic pattern.  There is some normalization of what the prior study showed with a slurred ST segment.  This current tracing has relatively normal appearance.  This time no indication of STEMI.  Will proceed with aspirin with pressure chest pain.  Will administer Dilaudid and Zofran for pain control as well.  Patient is currently moderately hypertensive with  blood pressures of 160s over 100s.  Patient reports being treated for hypertension greater than 6 months ago.  She reports her blood pressures normalized and she was taking off medications.  She reports she was not having any problems with hypertension off medications.  Blood pressures were typically running 120s to 130s.  This elevation is a more acute issue for the patient.  Troponin is flat.  I have personally reviewed PE study and do not appreciate any large or central PE.  Awaiting final read by radiology.  Dr. Carmie End will follow-up on radiology interpretation for final disposition.        Final Clinical Impression(s) / ED Diagnoses Final diagnoses:  Other chest pain  Dyspnea, unspecified type    Rx / DC Orders ED Discharge Orders     None         Arby Barrette, MD 06/11/23 1531

## 2023-06-11 NOTE — Telephone Encounter (Signed)
 Patient states having symptoms of shortness of breath and asthma. Triage nurses not available. Waited over 5 minutes. Pharmacy is [facility] [place]. Patient phone number is [phone].

## 2023-06-11 NOTE — Telephone Encounter (Signed)
 Called and spoke with patient, symptoms started on Saturday, in -4 degree wind.  She did not have her nebulizer with her.  She used her nebulizer last night and got some relief, used her steroid inhalers and got some relief.  She is having to sit propped up.  Her chest is tight.  She is coughing, with no mucous.  She stated she just had a CT scan for the nodules.  She is using Symbicort 2 puffs twice a day.  She has not used her nebulizer today.  She last used her tezspire was in December.  I advised her that it has been over a year since she has been seen and that is likely the problem with her prescription.  She has had chills/body aches.  No sick exposure that she knows of.  Advised I would get a message to the provider of the day as Dr. Isaiah Serge is not available and one of [place] will call her back.  She verbalized understanding.  Katie, please advise.  Patient was last seen on 05/26/2022.  Thank you.

## 2023-06-11 NOTE — ED Provider Notes (Signed)
 Patient with new hypoxia.  History of Sjogren's rheumatoid arthritis.  CT scan of chest is pending was overall unremarkable.  No PE, no pneumonia.  She is hypoxic still having some shortness of breath cough.  I do think this could be reactive airway/inflammatory process.  Breathing treatments Zithromax Solu-Medrol given.  Troponins are flat.  Think she benefit from echocardiogram observation stay admitted to hospitalist for further care.  This chart was dictated using voice recognition software.  Despite best efforts to proofread,  errors can occur which can change the documentation meaning.    Virgina Norfolk, DO 06/11/23 1731

## 2023-06-11 NOTE — Plan of Care (Signed)

## 2023-06-11 NOTE — Telephone Encounter (Signed)
 Called patient back and scheduled her to see Rhunette Croft on 06/12/23 at 11 am, advised to arrive by 10:45 am for check in.  She verbalized understanding.  Nothing further needed.

## 2023-06-12 ENCOUNTER — Observation Stay (HOSPITAL_BASED_OUTPATIENT_CLINIC_OR_DEPARTMENT_OTHER): Payer: 59

## 2023-06-12 ENCOUNTER — Ambulatory Visit: Payer: 59 | Admitting: Nurse Practitioner

## 2023-06-12 DIAGNOSIS — J9601 Acute respiratory failure with hypoxia: Secondary | ICD-10-CM

## 2023-06-12 DIAGNOSIS — R9389 Abnormal findings on diagnostic imaging of other specified body structures: Secondary | ICD-10-CM | POA: Diagnosis not present

## 2023-06-12 DIAGNOSIS — J45901 Unspecified asthma with (acute) exacerbation: Secondary | ICD-10-CM

## 2023-06-12 DIAGNOSIS — R079 Chest pain, unspecified: Secondary | ICD-10-CM

## 2023-06-12 DIAGNOSIS — M3500 Sicca syndrome, unspecified: Secondary | ICD-10-CM | POA: Diagnosis not present

## 2023-06-12 LAB — ECHOCARDIOGRAM COMPLETE
AR max vel: 2.49 cm2
AV Area VTI: 2.34 cm2
AV Area mean vel: 2.38 cm2
AV Mean grad: 8 mmHg
AV Peak grad: 11.6 mmHg
Ao pk vel: 1.7 m/s
Area-P 1/2: 3.63 cm2
Calc EF: 74.1 %
Height: 70 in
MV VTI: 3.34 cm2
S' Lateral: 2.8 cm
Single Plane A2C EF: 71.2 %
Single Plane A4C EF: 76.3 %
Weight: 2747.81 [oz_av]

## 2023-06-12 MED ORDER — GABAPENTIN 100 MG PO CAPS
100.0000 mg | ORAL_CAPSULE | Freq: Every day | ORAL | Status: DC | PRN
  Filled 2023-06-12: qty 1

## 2023-06-12 MED ORDER — PREDNISONE 20 MG PO TABS: 40.0000 mg | ORAL_TABLET | Freq: Every day | ORAL | 0 refills | Status: AC

## 2023-06-12 MED ORDER — ORAL CARE MOUTH RINSE: 15.0000 mL | OROMUCOSAL | Status: DC | PRN

## 2023-06-12 MED ORDER — ACETAMINOPHEN 325 MG PO TABS
650.0000 mg | ORAL_TABLET | Freq: Four times a day (QID) | ORAL | Status: DC | PRN
  Administered 2023-06-12: 650 mg via ORAL
  Filled 2023-06-12: qty 2

## 2023-06-12 NOTE — H&P (Addendum)
 History and Physical    Patient: Megan Reid JXB:[id number] DOB: [birth year] DOA: 06/11/2023 DOS: the patient was seen and examined on 06/12/2023 PCP: [name], MD  Patient coming from:  work  Chief Complaint:  Chief Complaint  Patient presents with   Chest Pain   HPI: Megan Reid is a 58 y.o. female with medical history significant for asthma, Sjogren syndrome and rheumatoid arthritis who presented to the ER from work because she could not breathe.  3 days ago the patient was traveling.  She was in [place] and she said it was extremely dusty and freezing cold.  The cold wind she thinks is what caused her flare her up  Last night upon getting back into town the patient immediately got on her nebulizer machine when she woke up she felt maybe a little bit better and decided to go into work.  He did make an appointment with her pulmonologist and that appointment is actually tomorrow.  While at work she got so winded with just a short walk.  Felt like an elephant was sitting on her chest.  The patient did not want to leave by ambulance so her husband came to pick her up from work.  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  She says the walk to the car was very difficult.  Her husband took her to the emergency department in the walk from registration to her room was excruciating.  She denies any fevers or chills.  Her evaluation in the emergency department revealed that she required 2 L O2 nasal cannula to keep her sats up.  She was treated with nebs and IV steroids but continued to require oxygen so she was admitted to the hospital.   She is experiencing increased fatigue and frequent night sweats which have been going on for several years.  The patient quit taking Biologics in 2017.  She found that CBD oil rubbed on her joints did a better job in controlling her symptoms. She has been off the CBD oil since 2023 because of the change in [place]  laws.  Review of Systems: As mentioned in the history of present illness. All other systems reviewed and are negative. Past Medical History:  Diagnosis Date   Arthralgia 02/14/2016   Asthma    Autoimmune disease (HCC) 02/14/2016   Positive RF 139 CCP Negative  Positive ANA 1:640 Positive Ro Positive La Elevated ESR 105 - Sjorgen's Disease   DDD (degenerative disc disease), lumbar 02/14/2016   Fatigue 02/14/2016   Gastritis 02/14/2016   Hot flashes    Migraines 02/14/2016   Osteoarthritis of both hands 02/14/2016   Osteoarthritis of both knees 02/14/2016   Vitamin D deficiency 02/14/2016   Past Surgical History:  Procedure Laterality Date   BREAST BIOPSY Left    ROTATOR CUFF REPAIR Right 06/16/2019   TUBAL LIGATION     Social History:  reports that she quit smoking about 37 years ago. Her smoking use included cigarettes. She started smoking about 39 years ago. She has a 1 pack-year smoking history. She has never been exposed to tobacco smoke. She has never used smokeless tobacco. She reports that she does not drink alcohol and does not use drugs.  Allergies  Allergen Reactions   Carbamazepine Other (See Comments)    Suspected hepatotoxicity and pancytopenia, required inpatient admission 11/2021  Liver and kidney failure   Misc. Sulfonamide Containing Compounds Anaphylaxis, Hives, Rash and Swelling    Swelling of  the face and tongue , Swelling of the face and tongue   Other Hives, Itching, Shortness Of Breath and Swelling   Strawberry Extract Other (See Comments), Itching and Rash    Unknown reaction  Unknown reaction, Unknown reaction, Unknown reaction   Sulfa Antibiotics Anaphylaxis, Hives and Swelling    Swelling of the face and tongue     Family History  Problem Relation Age of Onset   Kidney failure Mother    Heart failure Mother    Lupus Mother    COPD Father    Hypertension Sister    Acromegaly Sister    Breast cancer Paternal Aunt 12    Prior to Admission  medications   Medication Sig Start Date End Date Taking? Authorizing Provider  albuterol (PROVENTIL HFA;VENTOLIN HFA) 108 (90 Base) MCG/ACT inhaler Inhale 2 puffs into the lungs 2 (two) times daily. For shortness of breath 04/15/18  Yes Reubin Milan, MD  busPIRone (BUSPAR) 5 MG tablet Take 1 tablet (5 mg total) by mouth 2 (two) times daily. Patient taking differently: Take 5 mg by mouth 2 (two) times daily. prn 05/29/22  Yes Elijah Birk C, DO  gabapentin (NEURONTIN) 100 MG capsule Take 1 capsule (100 mg total) by mouth daily. Patient taking differently: Take 100 mg by mouth daily as needed (knee pain). 11/22/22  Yes Elijah Birk C, DO  gabapentin (NEURONTIN) 100 MG capsule Take 1 capsule by mouth daily as needed (knee pain). 11/22/22  Yes [provider]  hydrOXYzine (ATARAX) 10 MG tablet Take 1 tablet (10 mg total) by mouth 3 (three) times daily as needed for anxiety. 12/16/21  Yes Love, Evlyn Kanner, PA-C  melatonin 5 MG TABS Take 1 tablet (5 mg total) by mouth at bedtime as needed. Patient taking differently: Take 5 mg by mouth at bedtime as needed (for sleep). 12/16/21  Yes Love, Evlyn Kanner, PA-C  montelukast (SINGULAIR) 10 MG tablet Take 10 mg by mouth daily as needed (for allergies).   Yes [provider]  Multiple Vitamin (MULTIVITAMIN WITH MINERALS) TABS tablet Take 1 tablet by mouth daily. 12/17/21  Yes Love, Evlyn Kanner, PA-C  mupirocin ointment (BACTROBAN) 2 % Apply 1 Application topically daily as needed (for rash). 02/05/23  Yes [provider]  pantoprazole (PROTONIX) 40 MG tablet Take 1 tablet (40 mg total) by mouth daily. 06/27/21  Yes Ellamae Sia, DO  SYMBICORT 160-4.5 MCG/ACT inhaler INHALE 2 PUFFS INTO THE LUNGS TWICE DAILY Patient taking differently: Inhale 2 puffs into the lungs in the morning and at bedtime. 04/16/19  Yes Reubin Milan, MD  triamcinolone (NASACORT) 55 MCG/ACT AERO nasal inhaler Place 1 spray into the nose 2 (two) times daily as needed (nasal  symptoms). 03/08/22  Yes Ellamae Sia, DO  levalbuterol [facility] HFA) 45 MCG/ACT inhaler Inhale into the lungs. Patient not taking: Reported on 06/11/2023 04/13/18   [provider]    Physical Exam: Vitals:   06/11/23 2001 06/11/23 2103 06/11/23 2244 06/12/23 0309  BP:  (!) 167/97  (!) 148/94  Pulse:  86  84  Resp:  20  18  Temp: 98 F (36.7 C)   98.5 F (36.9 C)  TempSrc: Oral   Oral  SpO2:  100% 100% 100%  Weight:  77.9 kg    Height:  5\' 10"  (1.778 m)     Physical Exam:  General: No acute distress, well developed, well nourished HEENT: Normocephalic, atraumatic, PERRL Cardiovascular: Normal rate and rhythm. Distal pulses intact. Pulmonary: Normal  pulmonary effort, normal breath sounds Gastrointestinal: Nondistended abdomen, soft, non-tender, normoactive bowel sounds Musculoskeletal:Normal ROM, no lower ext edema, no synovitis or joint deformity.   Skin: Skin is warm and dry.  Her skin is hot to the touch, especially in right lower leg Neuro: No focal deficits noted, AAOx3. PSYCH: Attentive and cooperative  Data Reviewed:  Results for orders placed or performed during the hospital encounter of 06/11/23 (from the past 24 hours)  CBC     Status: Abnormal   Collection Time: 06/11/23 11:30 AM  Result Value Ref Range   WBC 4.6 4.0 - 10.5 K/uL   RBC 4.08 3.87 - 5.11 MIL/uL   Hemoglobin 11.4 (L) 12.0 - 15.0 g/dL   HCT 84.1 (L) 32.4 - 40.1 %   MCV 86.3 80.0 - 100.0 fL   MCH 27.9 26.0 - 34.0 pg   MCHC 32.4 30.0 - 36.0 g/dL   RDW 02.7 25.3 - 66.4 %   Platelets 111 (L) 150 - 400 K/uL   nRBC 0.0 0.0 - 0.2 %  Troponin I (High Sensitivity)     Status: None   Collection Time: 06/11/23 11:30 AM  Result Value Ref Range   Troponin I (High Sensitivity) 17 <18 ng/L  Resp panel by RT-PCR (RSV, Flu A&B, Covid) Anterior Nasal Swab     Status: None   Collection Time: 06/11/23 11:30 AM   Specimen: Anterior Nasal Swab  Result Value Ref Range   SARS Coronavirus 2 by RT PCR  NEGATIVE NEGATIVE   Influenza A by PCR NEGATIVE NEGATIVE   Influenza B by PCR NEGATIVE NEGATIVE   Resp Syncytial Virus by PCR NEGATIVE NEGATIVE  Lactic acid, plasma     Status: None   Collection Time: 06/11/23 12:25 PM  Result Value Ref Range   Lactic Acid, Venous 1.0 0.5 - 1.9 mmol/L  Lipase, blood     Status: None   Collection Time: 06/11/23 12:25 PM  Result Value Ref Range   Lipase 32 11 - 51 U/L  Hepatic function panel     Status: Abnormal   Collection Time: 06/11/23 12:25 PM  Result Value Ref Range   Total Protein 10.6 (H) 6.5 - 8.1 g/dL   Albumin 3.1 (L) 3.5 - 5.0 g/dL   AST 36 15 - 41 U/L   ALT 18 0 - 44 U/L   Alkaline Phosphatase 50 38 - 126 U/L   Total Bilirubin 0.8 0.0 - 1.2 mg/dL   Bilirubin, Direct 0.2 0.0 - 0.2 mg/dL   Indirect Bilirubin 0.6 0.3 - 0.9 mg/dL  Urinalysis, Routine w reflex microscopic -Urine, Clean Catch     Status: Abnormal   Collection Time: 06/11/23 12:25 PM  Result Value Ref Range   Color, Urine YELLOW YELLOW   APPearance CLOUDY (A) CLEAR   Specific Gravity, Urine 1.015 1.005 - 1.030   pH 5.5 5.0 - 8.0   Glucose, UA NEGATIVE NEGATIVE mg/dL   Hgb urine dipstick TRACE (A) NEGATIVE   Bilirubin Urine NEGATIVE NEGATIVE   Ketones, ur NEGATIVE NEGATIVE mg/dL   Protein, ur 30 (A) NEGATIVE mg/dL   Nitrite NEGATIVE NEGATIVE   Leukocytes,Ua SMALL (A) NEGATIVE  Sedimentation rate     Status: Abnormal   Collection Time: 06/11/23 12:25 PM  Result Value Ref Range   Sed Rate 117 (H) 0 - 22 mm/hr  Urinalysis, Microscopic (reflex)     Status: Abnormal   Collection Time: 06/11/23 12:25 PM  Result Value Ref Range   RBC / HPF 0-5 0 -  5 RBC/hpf   WBC, UA 6-10 0 - 5 WBC/hpf   Bacteria, UA MANY (A) NONE SEEN   Squamous Epithelial / HPF 6-10 0 - 5 /HPF  Brain natriuretic peptide     Status: Abnormal   Collection Time: 06/11/23 12:26 PM  Result Value Ref Range   B Natriuretic Peptide 162.5 (H) 0.0 - 100.0 pg/mL  Basic metabolic panel     Status: Abnormal    Collection Time: 06/11/23 12:45 PM  Result Value Ref Range   Sodium 133 (L) 135 - 145 mmol/L   Potassium 3.6 3.5 - 5.1 mmol/L   Chloride 104 98 - 111 mmol/L   CO2 22 22 - 32 mmol/L   Glucose, Bld 101 (H) 70 - 99 mg/dL   BUN 13 6 - 20 mg/dL   Creatinine, Ser 5.78 0.44 - 1.00 mg/dL   Calcium 9.4 8.9 - 46.9 mg/dL   GFR, Estimated >62 >95 mL/min   Anion gap 7 5 - 15  Troponin I (High Sensitivity)     Status: Abnormal   Collection Time: 06/11/23  1:15 PM  Result Value Ref Range   Troponin I (High Sensitivity) 18 (H) <18 ng/L  Lactic acid, plasma     Status: None   Collection Time: 06/11/23  2:25 PM  Result Value Ref Range   Lactic Acid, Venous 0.8 0.5 - 1.9 mmol/L  C-reactive protein     Status: Abnormal   Collection Time: 06/11/23  2:52 PM  Result Value Ref Range   CRP 3.9 (H) <1.0 mg/dL  Troponin I (High Sensitivity)     Status: None   Collection Time: 06/11/23  5:35 PM  Result Value Ref Range   Troponin I (High Sensitivity) 15 <18 ng/L     Assessment and Plan: 58 year old with history of Sjogren's, rheumatoid arthritis and asthma   Asthma exacerbation and hypoxic respiratory failure - steroids, nebs. By the time of my evaluation the patient is not wheezing and she is no longer requiring 2 L O2 nasal cannula.  No wheezing was ever documented..  She just had cough plus shortness of breath but this does sound like a asthma exacerbation.   2.Fatigue / night sweats -this has been going on for years.  It is unclear if this is part of her autoimmune disease or menopause or something else.  3. Abnormal CT - 4 mm ascending aorta. Check Echo   Advance Care Planning:   Code Status: Prior CODE STATUS was not addressed.  The patient will be full code for now.  Consults: none  Family Communication: none  Severity of Illness: The appropriate patient status for this patient is OBSERVATION. Observation status is judged to be reasonable and necessary in order to provide the required  intensity of service to ensure the patient's safety. The patient's presenting symptoms, physical exam findings, and initial radiographic and laboratory data in the context of their medical condition is felt to place them at decreased risk for further clinical deterioration. Furthermore, it is anticipated that the patient will be medically stable for discharge from the hospital within 2 midnights of admission.   Author: Buena Irish, MD 06/12/2023 3:20 AM  For on call review [email or web address].

## 2023-06-12 NOTE — Progress Notes (Signed)
 AVS reviewed.   IV removed. All Questions answered.  Patient requested an ambulatory pulse ox before discharge.

## 2023-06-12 NOTE — Discharge Summary (Signed)
 Physician Discharge Summary  Megan Reid:[id number] DOB: [birth year] DOA: 06/11/2023  PCP: [name], MD  Admit date: 06/11/2023 Discharge date: 06/12/2023  Admitted From: Home Disposition: Home  Recommendations for Outpatient Follow-up:  Follow up with PCP in 1-2 weeks Will send referral to cardiothoracic surgery for subsequent follow-up.  Home Health: N/A Equipment/Devices: N/A  Discharge Condition: Stable CODE STATUS: Code Diet recommendation: Regular diet  Discharge summary: 58 year old with history of mild intermittent asthma, Sjogren's syndrome and rheumatoid arthritis presented with about 3 days of chest tightness, cough and wheezing.  She was short of breath just walking short distance.  Came to the ER.  She was treated with nebs and IV steroids but continued to initially require oxygen so was asked to be monitored in the hospital.  Acute asthma exacerbation with hypoxemia: Clinically improved.  Asymptomatic today.  On room air and mobilizing around. Prednisone 40 mg daily x 3 days Continue albuterol as needed , resume symbacort daily  Negative for pneumonia, negative for flu ,RSV and COVID.  Chest pain: Ruled out.  No evidence of acute coronary syndrome.  Ascending aortic aneurysm: CT angiogram was consistent with 4 cm ascending aortic aneurysm.  Echocardiogram with no evidence of aortic regurgitation or aortic root involvement.  Patient will need surveillance and follow-up.  Will send referral to cardiothoracic surgery.  Stable for discharge.   Discharge Diagnoses:  Principal Problem:   Acute hypoxemic respiratory failure [facility])    Discharge Instructions  Discharge Instructions     Ambulatory referral to Cardiothoracic Surgery   Complete by: As directed    Call MD for:  difficulty breathing, headache or visual disturbances   Complete by: As directed    Diet general   Complete by: As directed    Increase activity slowly   Complete by: As  directed       Allergies as of 06/12/2023       Reactions   Carbamazepine Other (See Comments)   Suspected hepatotoxicity and pancytopenia, required inpatient admission 11/2021 Liver and kidney failure   Misc. Sulfonamide Containing Compounds Anaphylaxis, Hives, Rash, Swelling   Swelling of the face and tongue , Swelling of the face and tongue   Other Hives, Itching, Shortness Of Breath, Swelling   Strawberry Extract Other (See Comments), Itching, Rash   Unknown reaction Unknown reaction, Unknown reaction, Unknown reaction   Sulfa Antibiotics Anaphylaxis, Hives, Swelling   Swelling of the face and tongue         Medication List     STOP taking these medications    levalbuterol 45 MCG/ACT inhaler Commonly known as: XOPENEX HFA       TAKE these medications    albuterol 108 (90 Base) MCG/ACT inhaler Commonly known as: VENTOLIN HFA Inhale 2 puffs into the lungs 2 (two) times daily. For shortness of breath   busPIRone 5 MG tablet Commonly known as: BUSPAR Take 1 tablet (5 mg total) by mouth 2 (two) times daily. What changed: additional instructions   gabapentin 100 MG capsule Commonly known as: Neurontin Take 1 capsule (100 mg total) by mouth daily. What changed:  when to take this reasons to take this   gabapentin 100 MG capsule Commonly known as: NEURONTIN Take 1 capsule by mouth daily as needed (knee pain). What changed: Another medication with the same name was changed. Make sure you understand how and when to take each.   hydrOXYzine 10 MG tablet Commonly known as: ATARAX Take 1 tablet (10 mg total) by mouth 3 (  three) times daily as needed for anxiety.   melatonin 5 MG Tabs Take 1 tablet (5 mg total) by mouth at bedtime as needed. What changed: reasons to take this   montelukast 10 MG tablet Commonly known as: SINGULAIR Take 10 mg by mouth daily as needed (for allergies).   multivitamin with minerals Tabs tablet Take 1 tablet by mouth daily.    mupirocin ointment 2 % Commonly known as: BACTROBAN Apply 1 Application topically daily as needed (for rash).   pantoprazole 40 MG tablet Commonly known as: Protonix Take 1 tablet (40 mg total) by mouth daily.   predniSONE 20 MG tablet Commonly known as: DELTASONE Take 2 tablets (40 mg total) by mouth daily for 3 days.   Symbicort 160-4.5 MCG/ACT inhaler Generic drug: budesonide-formoterol INHALE 2 PUFFS INTO THE LUNGS TWICE DAILY What changed: when to take this   triamcinolone 55 MCG/ACT Aero nasal inhaler Commonly known as: NASACORT Place 1 spray into the nose 2 (two) times daily as needed (nasal symptoms).        Follow-up Information     [name], MD Follow up.   Specialty: Internal Medicine Contact information: [address] [place] [address] [phone]                Allergies  Allergen Reactions   Carbamazepine Other (See Comments)    Suspected hepatotoxicity and pancytopenia, required inpatient admission 11/2021  Liver and kidney failure   Misc. Sulfonamide Containing Compounds Anaphylaxis, Hives, Rash and Swelling    Swelling of the face and tongue , Swelling of the face and tongue   Other Hives, Itching, Shortness Of Breath and Swelling   Strawberry Extract Other (See Comments), Itching and Rash    Unknown reaction  Unknown reaction, Unknown reaction, Unknown reaction   Sulfa Antibiotics Anaphylaxis, Hives and Swelling    Swelling of the face and tongue     Consultations: None   Procedures/Studies: ECHOCARDIOGRAM COMPLETE Result Date: 06/12/2023    ECHOCARDIOGRAM REPORT   Patient Name:   Megan Reid Date of Exam: 06/12/2023 Medical Rec #:  [id number]       Height:       70.0 in Accession #:    [id number]      Weight:       171.7 lb Date of Birth:  Dec 26, 1965        BSA:          1.956 m Patient Age:    58 years        BP:           133/98 mmHg Patient Gender: F               HR:           91 bpm. Exam  Location:  Inpatient Procedure: Cardiac Doppler, Color Doppler and 2D Echo (Both Spectral and Color            Flow Doppler were utilized during procedure). Indications:    Dissection of thoracic aorta  History:        Patient has prior history of Echocardiogram examinations, most                 recent 12/05/2021. Risk Factors:Hypertension.  Sonographer:    Amy Chionchio Referring Phys: Buena Irish IMPRESSIONS  1. Mild intracavitary gradient of . Left ventricular ejection fraction, by estimation, is 65 to 70%. The left ventricle has hyperdynamic function. The left ventricle has no regional wall  motion abnormalities. Left ventricular diastolic parameters were normal.  2. Right ventricular systolic function is normal. The right ventricular size is normal.  3. Left atrial size was mildly dilated.  4. The mitral valve is normal in structure. Trivial mitral valve regurgitation. No evidence of mitral stenosis.  5. The aortic valve is normal in structure. Aortic valve regurgitation is not visualized. No aortic stenosis is present.  6. The inferior vena cava is normal in size with greater than 50% respiratory variability, suggesting right atrial pressure of 3 mmHg. FINDINGS  Left Ventricle: Mild intracavitary gradient of . Left ventricular ejection fraction, by estimation, is 65 to 70%. The left ventricle has hyperdynamic function. The left ventricle has no regional wall motion abnormalities. Strain imaging was not performed. The left ventricular internal cavity size was normal in size. There is no left ventricular hypertrophy. Left ventricular diastolic parameters were normal. Right Ventricle: The right ventricular size is normal. No increase in right ventricular wall thickness. Right ventricular systolic function is normal. Left Atrium: Left atrial size was mildly dilated. Right Atrium: Right atrial size was normal in size. Pericardium: There is no evidence of pericardial effusion. Mitral Valve: The mitral  valve is normal in structure. Trivial mitral valve regurgitation. No evidence of mitral valve stenosis. MV peak gradient, 4.8 mmHg. The mean mitral valve gradient is 3.0 mmHg. Tricuspid Valve: The tricuspid valve is normal in structure. Tricuspid valve regurgitation is not demonstrated. No evidence of tricuspid stenosis. Aortic Valve: The aortic valve is normal in structure. Aortic valve regurgitation is not visualized. No aortic stenosis is present. Aortic valve mean gradient measures 8.0 mmHg. Aortic valve peak gradient measures 11.6 mmHg. Aortic valve area, by VTI measures 2.34 cm. Pulmonic Valve: The pulmonic valve was normal in structure. Pulmonic valve regurgitation is not visualized. No evidence of pulmonic stenosis. Aorta: The aortic root, ascending aorta and aortic arch are all structurally normal, with no evidence of dilitation or obstruction. Venous: The inferior vena cava is normal in size with greater than 50% respiratory variability, suggesting right atrial pressure of 3 mmHg. IAS/Shunts: No atrial level shunt detected by color flow Doppler. Additional Comments: 3D imaging was not performed.  LEFT VENTRICLE PLAX 2D LVIDd:         4.10 cm      Diastology LVIDs:         2.80 cm      LV e' medial:    7.40 cm/s LV PW:         0.90 cm      LV E/e' medial:  13.9 LV IVS:        1.10 cm      LV e' lateral:   8.27 cm/s LVOT diam:     2.00 cm      LV E/e' lateral: 12.5 LV SV:         78 LV SV Index:   40 LVOT Area:     3.14 cm  LV Volumes (MOD) LV vol d, MOD A2C: 63.6 ml LV vol d, MOD A4C: 106.0 ml LV vol s, MOD A2C: 18.3 ml LV vol s, MOD A4C: 25.1 ml LV SV MOD A2C:     45.3 ml LV SV MOD A4C:     106.0 ml LV SV MOD BP:      65.1 ml RIGHT VENTRICLE             IVC RV Basal diam:  3.50 cm     IVC diam: 2.00 cm RV S prime:  14.60 cm/s TAPSE (M-mode): 2.2 cm LEFT ATRIUM             Index        RIGHT ATRIUM           Index LA Vol (A2C):   44.8 ml 22.90 ml/m  RA Area:     20.00 cm LA Vol (A4C):   56.7 ml 28.98  ml/m  RA Volume:   58.30 ml  29.80 ml/m LA Biplane Vol: 50.2 ml 25.66 ml/m  AORTIC VALVE                     PULMONIC VALVE AV Area (Vmax):    2.49 cm      PV Vmax:          1.38 m/s AV Area (Vmean):   2.38 cm      PV Peak grad:     7.6 mmHg AV Area (VTI):     2.34 cm      PR End Diast Vel: 4.93 msec AV Vmax:           170.00 cm/s AV Vmean:          132.000 cm/s AV VTI:            0.333 m AV Peak Grad:      11.6 mmHg AV Mean Grad:      8.0 mmHg LVOT Vmax:         135.00 cm/s LVOT Vmean:        100.000 cm/s LVOT VTI:          0.248 m LVOT/AV VTI ratio: 0.74  AORTA Ao Root diam: 3.00 cm Ao Asc diam:  3.80 cm MITRAL VALVE                TRICUSPID VALVE MV Area (PHT): 3.63 cm     TR Peak grad:   21.3 mmHg MV Area VTI:   3.34 cm     TR Vmax:        231.00 cm/s MV Peak grad:  4.8 mmHg MV Mean grad:  3.0 mmHg     SHUNTS MV Vmax:       1.10 m/s     Systemic VTI:  0.25 m MV Vmean:      79.0 cm/s    Systemic Diam: 2.00 cm MV Decel Time: 209 msec MV E velocity: 103.00 cm/s MV A velocity: 114.00 cm/s MV E/A ratio:  0.90 Clearnce Hasten Electronically signed by Clearnce Hasten Signature Date/Time: 06/12/2023/11:55:00 AM    Final    CT Angio Chest PE W/Cm &/Or Wo Cm Result Date: 06/11/2023 CLINICAL DATA:  Two day history of worsening chest pain associated with cough, congestion, and shortness of breath EXAM: CT ANGIOGRAPHY CHEST WITH CONTRAST TECHNIQUE: Multidetector CT imaging of the chest was performed using the standard protocol during bolus administration of intravenous contrast. Multiplanar CT image reconstructions and MIPs were obtained to evaluate the vascular anatomy. RADIATION DOSE REDUCTION: This exam was performed according to the departmental dose-optimization program which includes automated exposure control, adjustment of the mA and/or kV according to patient size and/or use of iterative reconstruction technique. CONTRAST:  75mL OMNIPAQUE IOHEXOL 350 MG/ML SOLN COMPARISON:  Same day chest radiograph, CT  chest dated 05/24/2023 FINDINGS: Cardiovascular: The study is high quality for the evaluation of pulmonary embolism. There are no filling defects in the central, lobar, segmental or subsegmental pulmonary artery branches to suggest acute pulmonary embolism. Ascending thoracic aorta measures 4.0 x 3.9  cm. Left atrial enlargement. RV:LV ratio <1. No significant pericardial fluid/thickening. Aortic atherosclerosis. Mediastinum/Nodes: Imaged thyroid gland without nodules meeting criteria for imaging follow-up by size. Normal esophagus. No pathologically enlarged axillary, supraclavicular, mediastinal, or hilar lymph nodes. Lungs/Pleura: The central airways are patent. Lingular and bilateral lower lobe subsegmental atelectasis. No focal consolidation. No pneumothorax. No pleural effusion. Upper abdomen: Normal. Musculoskeletal: No acute or abnormal lytic or blastic osseous lesions. Review of the MIP images confirms the above findings. IMPRESSION: 1. No evidence of pulmonary embolism or other acute intrathoracic process. 2. Left atrial enlargement. 3. Ascending thoracic aorta measures 4.0 cm. Recommend annual imaging followup by CTA or MRA. This recommendation follows 2010 ACCF/AHA/AATS/ACR/ASA/SCA/SCAI/SIR/STS/SVM Guidelines for the Diagnosis and Management of Patients with Thoracic Aortic Disease. Circulation. 2010; 121: G295-M841. Aortic aneurysm NOS (ICD10-I71.9) 4.  Aortic Atherosclerosis (ICD10-I70.0). Electronically Signed   By: Agustin Cree M.D.   On: 06/11/2023 16:18   DG Chest 2 View Result Date: 06/11/2023 CLINICAL DATA:  Chest pain EXAM: CHEST - 2 VIEW COMPARISON:  12/04/2021 CT 05/24/2023 FINDINGS: Normal cardiac silhouette. New bibasilar airspace densities greater on the LEFT. Small effusions. Upper lungs clear. No pneumothorax. IMPRESSION: Concern for bibasilar pneumonia Electronically Signed   By: Genevive Bi M.D.   On: 06/11/2023 13:38   CT CHEST HIGH RESOLUTION Result Date: 06/11/2023 CLINICAL  DATA:  Chronic cough, lung nodules. EXAM: CT CHEST WITHOUT CONTRAST TECHNIQUE: Multidetector CT imaging of the chest was performed following the standard protocol without intravenous contrast. High resolution imaging of the lungs, as well as inspiratory and expiratory imaging, was performed. RADIATION DOSE REDUCTION: This exam was performed according to the departmental dose-optimization program which includes automated exposure control, adjustment of the mA and/or kV according to patient size and/or use of iterative reconstruction technique. COMPARISON:  PET 05/24/2022, CT chest 03/20/2022 and 03/17/2021. FINDINGS: Cardiovascular: Atherosclerotic calcification of the aorta. Ascending aorta measures 3.9 cm (coronal image 90). Heart is at the upper limits of normal in size to mildly enlarged. No pericardial effusion. Mediastinum/Nodes: Mediastinal, subpectoral and axillary lymph nodes are not enlarged by CT size criteria and appear similar to 03/20/2022. Hilar regions are difficult to definitively evaluate without IV contrast. Esophagus is grossly unremarkable. Lungs/Pleura: Favor bibasilar scarring, unchanged from 03/20/2022. Negative for subpleural reticulation, traction bronchiectasis/bronchiolectasis, ground glass, architectural distortion or honeycombing. Pulmonary nodules measure 3 mm or less in size, unchanged. Per Fleischner Society guidelines, no follow-up is necessary. No pleural effusion. Airway is unremarkable. No air trapping. Upper Abdomen: Punctate right renal stone. Visualized portions of the liver, gallbladder, adrenal glands, kidneys, spleen, pancreas, stomach and bowel are otherwise grossly unremarkable. No upper abdominal adenopathy. Musculoskeletal: Minimal degenerative change in the spine. IMPRESSION: 1. Favor bibasilar scarring over interstitial lung disease. 2. Small mediastinal, subpectoral and axillary lymph nodes, chronically stable and not hypermetabolic on 05/24/2022. 3. Punctate right  renal stone. 4.  Aortic atherosclerosis (ICD10-I70.0). Electronically Signed   By: Leanna Battles M.D.   On: 06/11/2023 08:57   (Echo, Carotid, EGD, Colonoscopy, ERCP)    Subjective: Patient seen in the morning rounds.  She denies any complaints.  Denies any cough wheezing or chest tightness.  On room air.  Discussed CT scan findings and echocardiogram findings with the patient.   Discharge Exam: Vitals:   06/12/23 0309 06/12/23 0800  BP: (!) 148/94 (!) 133/90  Pulse: 84 93  Resp: 18 18  Temp: 98.5 F (36.9 C) 99.2 F (37.3 C)  SpO2: 100% 100%   Vitals:   06/11/23 2103 06/11/23  2244 06/12/23 0309 06/12/23 0800  BP: (!) 167/97  (!) 148/94 (!) 133/90  Pulse: 86  84 93  Resp: 20  18 18   Temp:   98.5 F (36.9 C) 99.2 F (37.3 C)  TempSrc:   Oral Oral  SpO2: 100% 100% 100% 100%  Weight: 77.9 kg     Height: 5\' 10"  (1.778 m)       General: Pt is alert, awake, not in acute distress Cardiovascular: RRR, S1/S2 +, no rubs, no gallops Respiratory: CTA bilaterally, no wheezing, no rhonchi Abdominal: Soft, NT, ND, bowel sounds + Extremities: no edema, no cyanosis    The results of significant diagnostics from this hospitalization (including imaging, microbiology, ancillary and laboratory) are listed below for reference.     Microbiology: Recent Results (from the past 240 hours)  Resp panel by RT-PCR (RSV, Flu A&B, Covid) Anterior Nasal Swab     Status: None   Collection Time: 06/11/23 11:30 AM   Specimen: Anterior Nasal Swab  Result Value Ref Range Status   SARS Coronavirus 2 by RT PCR NEGATIVE NEGATIVE Final    Comment: (NOTE) SARS-CoV-2 target nucleic acids are NOT DETECTED.  The SARS-CoV-2 RNA is generally detectable in upper respiratory specimens during the acute phase of infection. The lowest concentration of SARS-CoV-2 viral copies this assay can detect is 138 copies/mL. A negative result does not preclude SARS-Cov-2 infection and should not be used as the sole  basis for treatment or other patient management decisions. A negative result may occur with  improper specimen collection/handling, submission of specimen other than nasopharyngeal swab, presence of viral mutation(s) within the areas targeted by this assay, and inadequate number of viral copies(<138 copies/mL). A negative result must be combined with clinical observations, patient history, and epidemiological information. The expected result is Negative.  Fact Sheet for Patients:  BloggerCourse.com  Fact Sheet for Healthcare Providers:  SeriousBroker.it  This test is no t yet approved or cleared by the [place] FDA and  has been authorized for detection and/or diagnosis of SARS-CoV-2 by FDA under an Emergency Use Authorization (EUA). This EUA will remain  in effect (meaning this test can be used) for the duration of the COVID-19 declaration under Section 564(b)(1) of the Act, 21 U.S.C.section 360bbb-3(b)(1), unless the authorization is terminated  or revoked sooner.       Influenza A by PCR NEGATIVE NEGATIVE Final   Influenza B by PCR NEGATIVE NEGATIVE Final    Comment: (NOTE) The Xpert Xpress SARS-CoV-2/FLU/RSV plus assay is intended as an aid in the diagnosis of influenza from Nasopharyngeal swab specimens and should not be used as a sole basis for treatment. Nasal washings and aspirates are unacceptable for Xpert Xpress SARS-CoV-2/FLU/RSV testing.  Fact Sheet for Patients: BloggerCourse.com  Fact Sheet for Healthcare Providers: SeriousBroker.it  This test is not yet approved or cleared by the [place] FDA and has been authorized for detection and/or diagnosis of SARS-CoV-2 by FDA under an Emergency Use Authorization (EUA). This EUA will remain in effect (meaning this test can be used) for the duration of the COVID-19 declaration under Section 564(b)(1) of the Act,  21 U.S.C. section 360bbb-3(b)(1), unless the authorization is terminated or revoked.     Resp Syncytial Virus by PCR NEGATIVE NEGATIVE Final    Comment: (NOTE) Fact Sheet for Patients: BloggerCourse.com  Fact Sheet for Healthcare Providers: SeriousBroker.it  This test is not yet approved or cleared by the [place] FDA and has been authorized for detection and/or diagnosis of SARS-CoV-2  by FDA under an Emergency Use Authorization (EUA). This EUA will remain in effect (meaning this test can be used) for the duration of the COVID-19 declaration under Section 564(b)(1) of the Act, 21 U.S.C. section 360bbb-3(b)(1), unless the authorization is terminated or revoked.  Performed at [facility], [address] [place], [place] [address]      Labs: BNP (last 3 results) Recent Labs    06/11/23 1226  BNP 162.5*   Basic Metabolic Panel: Recent Labs  Lab 06/11/23 1245  NA 133*  K 3.6  CL 104  CO2 22  GLUCOSE 101*  BUN 13  CREATININE 0.91  CALCIUM 9.4   Liver Function Tests: Recent Labs  Lab 06/11/23 1225  AST 36  ALT 18  ALKPHOS 50  BILITOT 0.8  PROT 10.6*  ALBUMIN 3.1*   Recent Labs  Lab 06/11/23 1225  LIPASE 32   No results for input(s): "AMMONIA" in the last 168 hours. CBC: Recent Labs  Lab 06/11/23 1130  WBC 4.6  HGB 11.4*  HCT 35.2*  MCV 86.3  PLT 111*   Cardiac Enzymes: No results for input(s): "CKTOTAL", "CKMB", "CKMBINDEX", "TROPONINI" in the last 168 hours. BNP: Invalid input(s): "POCBNP" CBG: No results for input(s): "GLUCAP" in the last 168 hours. D-Dimer No results for input(s): "DDIMER" in the last 72 hours. Hgb A1c No results for input(s): "HGBA1C" in the last 72 hours. Lipid Profile No results for input(s): "CHOL", "HDL", "LDLCALC", "TRIG", "CHOLHDL", "LDLDIRECT" in the last 72 hours. Thyroid function studies No results for input(s): "TSH", "T4TOTAL", "T3FREE",  "THYROIDAB" in the last 72 hours.  Invalid input(s): "FREET3" Anemia work up No results for input(s): "VITAMINB12", "FOLATE", "FERRITIN", "TIBC", "IRON", "RETICCTPCT" in the last 72 hours. Urinalysis    Component Value Date/Time   COLORURINE YELLOW 06/11/2023 1225   APPEARANCEUR CLOUDY (A) 06/11/2023 1225   LABSPEC 1.015 06/11/2023 1225   PHURINE 5.5 06/11/2023 1225   GLUCOSEU NEGATIVE 06/11/2023 1225   HGBUR TRACE (A) 06/11/2023 1225   BILIRUBINUR NEGATIVE 06/11/2023 1225   KETONESUR NEGATIVE 06/11/2023 1225   PROTEINUR 30 (A) 06/11/2023 1225   NITRITE NEGATIVE 06/11/2023 1225   LEUKOCYTESUR SMALL (A) 06/11/2023 1225   Sepsis Labs Recent Labs  Lab 06/11/23 1130  WBC 4.6   Microbiology Recent Results (from the past 240 hours)  Resp panel by RT-PCR (RSV, Flu A&B, Covid) Anterior Nasal Swab     Status: None   Collection Time: 06/11/23 11:30 AM   Specimen: Anterior Nasal Swab  Result Value Ref Range Status   SARS Coronavirus 2 by RT PCR NEGATIVE NEGATIVE Final    Comment: (NOTE) SARS-CoV-2 target nucleic acids are NOT DETECTED.  The SARS-CoV-2 RNA is generally detectable in upper respiratory specimens during the acute phase of infection. The lowest concentration of SARS-CoV-2 viral copies this assay can detect is 138 copies/mL. A negative result does not preclude SARS-Cov-2 infection and should not be used as the sole basis for treatment or other patient management decisions. A negative result may occur with  improper specimen collection/handling, submission of specimen other than nasopharyngeal swab, presence of viral mutation(s) within the areas targeted by this assay, and inadequate number of viral copies(<138 copies/mL). A negative result must be combined with clinical observations, patient history, and epidemiological information. The expected result is Negative.  Fact Sheet for Patients:  BloggerCourse.com  Fact Sheet for Healthcare  Providers:  SeriousBroker.it  This test is no t yet approved or cleared by the [place] and  has been authorized for detection and/or diagnosis of SARS-CoV-2 by FDA under an Emergency Use Authorization (EUA). This EUA will remain  in effect (meaning this test can be used) for the duration of the COVID-19 declaration under Section 564(b)(1) of the Act, 21 U.S.C.section 360bbb-3(b)(1), unless the authorization is terminated  or revoked sooner.       Influenza A by PCR NEGATIVE NEGATIVE Final   Influenza B by PCR NEGATIVE NEGATIVE Final    Comment: (NOTE) The Xpert Xpress SARS-CoV-2/FLU/RSV plus assay is intended as an aid in the diagnosis of influenza from Nasopharyngeal swab specimens and should not be used as a sole basis for treatment. Nasal washings and aspirates are unacceptable for Xpert Xpress SARS-CoV-2/FLU/RSV testing.  Fact Sheet for Patients: BloggerCourse.com  Fact Sheet for Healthcare Providers: SeriousBroker.it  This test is not yet approved or cleared by the [place] FDA and has been authorized for detection and/or diagnosis of SARS-CoV-2 by FDA under an Emergency Use Authorization (EUA). This EUA will remain in effect (meaning this test can be used) for the duration of the COVID-19 declaration under Section 564(b)(1) of the Act, 21 U.S.C. section 360bbb-3(b)(1), unless the authorization is terminated or revoked.     Resp Syncytial Virus by PCR NEGATIVE NEGATIVE Final    Comment: (NOTE) Fact Sheet for Patients: BloggerCourse.com  Fact Sheet for Healthcare Providers: SeriousBroker.it  This test is not yet approved or cleared by the [place] FDA and has been authorized for detection and/or diagnosis of SARS-CoV-2 by FDA under an Emergency Use Authorization (EUA). This EUA will remain in effect (meaning this test can be  used) for the duration of the COVID-19 declaration under Section 564(b)(1) of the Act, 21 U.S.C. section 360bbb-3(b)(1), unless the authorization is terminated or revoked.  Performed at [facility], [address] [place], [place] [address]      Time coordinating discharge: 32 minutes  SIGNED:   Dorcas Carrow, MD  [facility] 06/12/2023, 1:41 PM

## 2023-06-12 NOTE — Progress Notes (Signed)
 Patient requested Ambulatory O2.  Patient walked entire unit and never dipped below 97%

## 2023-06-14 NOTE — Telephone Encounter (Signed)
 Patient had a cta on 06/11/23 do they still need this ct scheduled

## 2023-06-14 NOTE — Telephone Encounter (Signed)
 Got to looking at this order and the ct chest high resc was scheduled 05/24/23 and completed  The order that was placed is due in 05/2024

## 2023-06-15 NOTE — Telephone Encounter (Signed)
 Dr. Isaiah Serge, please advise if [facility] 05/2024 is needed?

## 2023-06-26 NOTE — Telephone Encounter (Signed)
 Yes. She will need a CT high res as it has been a year since last imaging. Please order. Thanks

## 2023-06-26 NOTE — Telephone Encounter (Signed)
 The HRCT was already ordered.

## 2023-07-02 ENCOUNTER — Institutional Professional Consult (permissible substitution): Payer: 59 | Admitting: Surgical

## 2023-07-02 VITALS — BP 142/92 | HR 81 | Resp 18 | Ht 70.0 in | Wt 176.0 lb

## 2023-07-02 DIAGNOSIS — I7121 Aneurysm of the ascending aorta, without rupture: Secondary | ICD-10-CM | POA: Diagnosis not present

## 2023-07-02 NOTE — Progress Notes (Signed)
 [address]       [place] [address]             [phone]        Megan Reid [id number] 1965-12-20   History of Present Illness: We are asked to see this 58 year old female in CT surgical consultation for 4.0 cm ascending thoracic aortic aneurysm found on CT angio of the chest done for PE evaluation on 06/11/2023.  She also underwent echocardiogram on 06/12/2023 and this revealed normal aortic valve without any significant findings of stenosis or regurgitation.  There is normal right and left ventricular function and the full report is as described.  She does have some mild dilation of the left atrium.  He has a history of 1 event of significant chest pain that she feels was related to a significant asthma attack.  Her cardiac history also includes shortness of breath and lower extremity swelling.  He does also complain of reflux and frequent heartburn.  She also has known Sjogren's disease with arthritic symptoms.    Past Medical History:  Diagnosis Date   Arthralgia 02/14/2016   Asthma    Autoimmune disease (HCC) 02/14/2016   Positive RF 139 CCP Negative  Positive ANA 1:640 Positive Ro Positive La Elevated ESR 105 - Sjorgen's Disease   DDD (degenerative disc disease), lumbar 02/14/2016   Fatigue 02/14/2016   Gastritis 02/14/2016   Hot flashes    Migraines 02/14/2016   Osteoarthritis of both hands 02/14/2016   Osteoarthritis of both knees 02/14/2016   Vitamin D deficiency 02/14/2016    Past Surgical History:  Procedure Laterality Date   BREAST BIOPSY Left    ROTATOR CUFF REPAIR Right 06/16/2019   TUBAL LIGATION       Allergies  Allergen Reactions   Carbamazepine Other (See Comments)    Suspected hepatotoxicity and pancytopenia, required inpatient admission 11/2021  Liver and kidney failure   Misc. Sulfonamide Containing Compounds Anaphylaxis, Hives, Rash and Swelling    Swelling of the face and tongue , Swelling of the face and tongue   Other  Hives, Itching, Shortness Of Breath and Swelling   Strawberry Extract Other (See Comments), Itching and Rash    Unknown reaction  Unknown reaction, Unknown reaction, Unknown reaction   Sulfa Antibiotics Anaphylaxis, Hives and Swelling    Swelling of the face and tongue       Social History   Occupational History   Occupation: Runner, broadcasting/film/video  Tobacco Use   Smoking status: Former    Current packs/day: 0.00    Average packs/day: 0.5 packs/day for 2.0 years (1.0 ttl pk-yrs)    Types: Cigarettes    Start date: 05/16/1984    Quit date: 05/16/1986    Years since quitting: 37.1    Passive exposure: Never   Smokeless tobacco: Never   Tobacco comments:    TEEN AGER  Vaping Use   Vaping status: Never Used  Substance and Sexual Activity   Alcohol use: No   Drug use: No   Sexual activity: Not on file      Current Outpatient Medications on File Prior to Visit  Medication Sig Dispense Refill   albuterol (PROVENTIL HFA;VENTOLIN HFA) 108 (90 Base) MCG/ACT inhaler Inhale 2 puffs into the lungs 2 (two) times daily. For shortness of breath 18 g 0   busPIRone (BUSPAR) 5 MG tablet Take 1 tablet (5 mg total) by mouth 2 (two) times daily. (Patient taking differently: Take 5 mg by mouth 2 (  two) times daily. prn) 60 tablet 3   gabapentin (NEURONTIN) 100 MG capsule Take 1 capsule (100 mg total) by mouth daily. (Patient taking differently: Take 100 mg by mouth daily as needed (knee pain).) 90 capsule 5   gabapentin (NEURONTIN) 100 MG capsule Take 1 capsule by mouth daily as needed (knee pain).     hydrOXYzine (ATARAX) 10 MG tablet Take 1 tablet (10 mg total) by mouth 3 (three) times daily as needed for anxiety. 30 tablet 0   melatonin 5 MG TABS Take 1 tablet (5 mg total) by mouth at bedtime as needed. (Patient taking differently: Take 5 mg by mouth at bedtime as needed (for sleep).) 30 tablet 0   montelukast (SINGULAIR) 10 MG tablet Take 10 mg by mouth daily as needed (for allergies).     Multiple Vitamin  (MULTIVITAMIN WITH MINERALS) TABS tablet Take 1 tablet by mouth daily.     mupirocin ointment (BACTROBAN) 2 % Apply 1 Application topically daily as needed (for rash).     pantoprazole (PROTONIX) 40 MG tablet Take 1 tablet (40 mg total) by mouth daily. 30 tablet 5   SYMBICORT 160-4.5 MCG/ACT inhaler INHALE 2 PUFFS INTO THE LUNGS TWICE DAILY (Patient taking differently: Inhale 2 puffs into the lungs in the morning and at bedtime.) 10.2 g 5   triamcinolone (NASACORT) 55 MCG/ACT AERO nasal inhaler Place 1 spray into the nose 2 (two) times daily as needed (nasal symptoms). 1 each 5   No current facility-administered medications on file prior to visit.     Ht 5\' 10"  (1.778 m)   LMP 05/22/2012   BMI 24.64 kg/m   Physical Exam Vitals reviewed.  Constitutional:      Appearance: Normal appearance.     Comments: Overweight  HENT:     Head: Normocephalic and atraumatic.     Mouth/Throat:     Mouth: Mucous membranes are moist.     Pharynx: No oropharyngeal exudate or posterior oropharyngeal erythema.  Eyes:     General: No scleral icterus.    Pupils: Pupils are equal, round, and reactive to light.  Neck:     Vascular: No carotid bruit.  Cardiovascular:     Rate and Rhythm: Normal rate and regular rhythm.     Heart sounds: Normal heart sounds.  Pulmonary:     Effort: Pulmonary effort is normal.     Breath sounds: Normal breath sounds.  Abdominal:     Palpations: Abdomen is soft.     Tenderness: There is no abdominal tenderness.  Musculoskeletal:        General: Tenderness present. No swelling.     Cervical back: Neck supple.     Right lower leg: No edema.     Left lower leg: No edema.     Comments: Left shin has tenderness to palpation Palpable left DP other pedal pulses are significantly weaker  Skin:    Capillary Refill: Capillary refill takes less than 2 seconds.     Coloration: Skin is not jaundiced.     Findings: No rash.  Neurological:     Mental Status: She is alert and  oriented to person, place, and time.  Psychiatric:        Mood and Affect: Mood normal.        Thought Content: Thought content normal.     CTA Results:  Narrative & Impression  CLINICAL DATA:  Two day history of worsening chest pain associated with cough, congestion, and shortness of breath   EXAM:  CT ANGIOGRAPHY CHEST WITH CONTRAST   TECHNIQUE: Multidetector CT imaging of the chest was performed using the standard protocol during bolus administration of intravenous contrast. Multiplanar CT image reconstructions and MIPs were obtained to evaluate the vascular anatomy.   RADIATION DOSE REDUCTION: This exam was performed according to the departmental dose-optimization program which includes automated exposure control, adjustment of the mA and/or kV according to patient size and/or use of iterative reconstruction technique.   CONTRAST:  75mL OMNIPAQUE IOHEXOL 350 MG/ML SOLN   COMPARISON:  Same day chest radiograph, CT chest dated 05/24/2023   FINDINGS: Cardiovascular: The study is high quality for the evaluation of pulmonary embolism. There are no filling defects in the central, lobar, segmental or subsegmental pulmonary artery branches to suggest acute pulmonary embolism. Ascending thoracic aorta measures 4.0 x 3.9 cm. Left atrial enlargement. RV:LV ratio <1. No significant pericardial fluid/thickening. Aortic atherosclerosis.   Mediastinum/Nodes: Imaged thyroid gland without nodules meeting criteria for imaging follow-up by size. Normal esophagus. No pathologically enlarged axillary, supraclavicular, mediastinal, or hilar lymph nodes.   Lungs/Pleura: The central airways are patent. Lingular and bilateral lower lobe subsegmental atelectasis. No focal consolidation. No pneumothorax. No pleural effusion.   Upper abdomen: Normal.   Musculoskeletal: No acute or abnormal lytic or blastic osseous lesions.   Review of the MIP images confirms the above findings.    IMPRESSION: 1. No evidence of pulmonary embolism or other acute intrathoracic process. 2. Left atrial enlargement. 3. Ascending thoracic aorta measures 4.0 cm. Recommend annual imaging followup by CTA or MRA. This recommendation follows 2010 ACCF/AHA/AATS/ACR/ASA/SCA/SCAI/SIR/STS/SVM Guidelines for the Diagnosis and Management of Patients with Thoracic Aortic Disease. Circulation. 2010; 121: Q657-Q469. Aortic aneurysm NOS (ICD10-I71.9) 4.  Aortic Atherosclerosis (ICD10-I70.0).     Electronically Signed   By: Agustin Cree M.D.   On: 06/11/2023 16:18        ECHOCARDIOGRAM REPORT       Patient Name:   Megan Reid Date of Exam: 06/12/2023  Medical Rec #:  [id number]       Height:       70.0 in  Accession #:    [id number]      Weight:       171.7 lb  Date of Birth:  [birth year]        BSA:          1.956 m  Patient Age:    57 years        BP:           133/98 mmHg  Patient Gender: F               HR:           91 bpm.  Exam Location:  Inpatient   Procedure: Cardiac Doppler, Color Doppler and 2D Echo (Both Spectral and  Color            Flow Doppler were utilized during procedure).   Indications:    Dissection of thoracic aorta    History:        Patient has prior history of Echocardiogram examinations,  most                 recent 12/05/2021. Risk Factors:Hypertension.    Sonographer:    Amy Chionchio  Referring Phys: Buena Irish   IMPRESSIONS     1. Mild intracavitary gradient of . Left ventricular ejection  fraction, by estimation, is 65 to 70%. The left ventricle has hyperdynamic  function. The left ventricle has  no regional wall motion abnormalities.  Left ventricular diastolic parameters  were normal.   2. Right ventricular systolic function is normal. The right ventricular  size is normal.   3. Left atrial size was mildly dilated.   4. The mitral valve is normal in structure. Trivial mitral valve  regurgitation. No evidence of mitral stenosis.   5.  The aortic valve is normal in structure. Aortic valve regurgitation is  not visualized. No aortic stenosis is present.   6. The inferior vena cava is normal in size with greater than 50%  respiratory variability, suggesting right atrial pressure of 3 mmHg.   FINDINGS   Left Ventricle: Mild intracavitary gradient of . Left ventricular  ejection fraction, by estimation, is 65 to 70%. The left ventricle has  hyperdynamic function. The left ventricle has no regional wall motion  abnormalities. Strain imaging was not  performed. The left ventricular internal cavity size was normal in size.  There is no left ventricular hypertrophy. Left ventricular diastolic  parameters were normal.   Right Ventricle: The right ventricular size is normal. No increase in  right ventricular wall thickness. Right ventricular systolic function is  normal.   Left Atrium: Left atrial size was mildly dilated.   Right Atrium: Right atrial size was normal in size.   Pericardium: There is no evidence of pericardial effusion.   Mitral Valve: The mitral valve is normal in structure. Trivial mitral  valve regurgitation. No evidence of mitral valve stenosis. MV peak  gradient, 4.8 mmHg. The mean mitral valve gradient is 3.0 mmHg.   Tricuspid Valve: The tricuspid valve is normal in structure. Tricuspid  valve regurgitation is not demonstrated. No evidence of tricuspid  stenosis.   Aortic Valve: The aortic valve is normal in structure. Aortic valve  regurgitation is not visualized. No aortic stenosis is present. Aortic  valve mean gradient measures 8.0 mmHg. Aortic valve peak gradient measures  11.6 mmHg. Aortic valve area, by VTI  measures 2.34 cm.   Pulmonic Valve: The pulmonic valve was normal in structure. Pulmonic valve  regurgitation is not visualized. No evidence of pulmonic stenosis.   Aorta: The aortic root, ascending aorta and aortic arch are all  structurally normal, with no evidence of  dilitation or obstruction.   Venous: The inferior vena cava is normal in size with greater than 50%  respiratory variability, suggesting right atrial pressure of 3 mmHg.   IAS/Shunts: No atrial level shunt detected by color flow Doppler.   Additional Comments: 3D imaging was not performed.     LEFT VENTRICLE  PLAX 2D  LVIDd:         4.10 cm      Diastology  LVIDs:         2.80 cm      LV e' medial:    7.40 cm/s  LV PW:         0.90 cm      LV E/e' medial:  13.9  LV IVS:        1.10 cm      LV e' lateral:   8.27 cm/s  LVOT diam:     2.00 cm      LV E/e' lateral: 12.5  LV SV:         78  LV SV Index:   40  LVOT Area:     3.14 cm    LV Volumes (MOD)  LV vol d, MOD A2C: 63.6 ml  LV vol d, MOD A4C: 106.0 ml  LV vol s, MOD A2C: 18.3 ml  LV vol s, MOD A4C: 25.1 ml  LV SV MOD A2C:     45.3 ml  LV SV MOD A4C:     106.0 ml  LV SV MOD BP:      65.1 ml   RIGHT VENTRICLE             IVC  RV Basal diam:  3.50 cm     IVC diam: 2.00 cm  RV S prime:     14.60 cm/s  TAPSE (M-mode): 2.2 cm   LEFT ATRIUM             Index        RIGHT ATRIUM           Index  LA Vol (A2C):   44.8 ml 22.90 ml/m  RA Area:     20.00 cm  LA Vol (A4C):   56.7 ml 28.98 ml/m  RA Volume:   58.30 ml  29.80 ml/m  LA Biplane Vol: 50.2 ml 25.66 ml/m   AORTIC VALVE                     PULMONIC VALVE  AV Area (Vmax):    2.49 cm      PV Vmax:          1.38 m/s  AV Area (Vmean):   2.38 cm      PV Peak grad:     7.6 mmHg  AV Area (VTI):     2.34 cm      PR End Diast Vel: 4.93 msec  AV Vmax:           170.00 cm/s  AV Vmean:          132.000 cm/s  AV VTI:            0.333 m  AV Peak Grad:      11.6 mmHg  AV Mean Grad:      8.0 mmHg  LVOT Vmax:         135.00 cm/s  LVOT Vmean:        100.000 cm/s  LVOT VTI:          0.248 m  LVOT/AV VTI ratio: 0.74    AORTA  Ao Root diam: 3.00 cm  Ao Asc diam:  3.80 cm   MITRAL VALVE                TRICUSPID VALVE  MV Area (PHT): 3.63 cm     TR Peak grad:   21.3 mmHg  MV  Area VTI:   3.34 cm     TR Vmax:        231.00 cm/s  MV Peak grad:  4.8 mmHg  MV Mean grad:  3.0 mmHg     SHUNTS  MV Vmax:       1.10 m/s     Systemic VTI:  0.25 m  MV Vmean:      79.0 cm/s    Systemic Diam: 2.00 cm  MV Decel Time: 209 msec  MV E velocity: 103.00 cm/s  MV A velocity: 114.00 cm/s  MV E/A ratio:  0.90   Clearnce Hasten  Electronically signed by Clearnce Hasten  Signature Date/Time: 06/12/2023/11:55:00 AM   A/P: 4.0 ascending thoracic aortic aneurysm.  Had a discussion related to good control of blood pressure.  He states she has no previous history of high blood pressure but it was a bit elevated today and  has been recently.  She is keeping a blood pressure diary and her primary care who she saw today also started her on medication but she was not certain the name.  Will see the patient again in 1 year with a repeat chest CTA.      Risk Modification:  Statin:  none  Smoking cessation instruction/counseling given:  remote h/o of former tobacco abuse  Patient was counseled on importance of Blood Pressure Control.  Despite Medical intervention if the patient notices persistently elevated blood pressure readings.  They are instructed to contact their Primary Care Physician  Please avoid use of Fluoroquinolones as this can potentially increase your risk of Aortic Rupture and/or Dissection  Patient educated on signs and symptoms of Aortic Dissection, handout also provided in AVS  Rowe Clack, PA-C 07/02/23

## 2023-07-02 NOTE — Patient Instructions (Signed)
 Activity and lifestyle modifications as discussed including nutrition and healthy habits.

## 2023-07-04 ENCOUNTER — Other Ambulatory Visit: Payer: Self-pay

## 2023-07-04 DIAGNOSIS — D696 Thrombocytopenia, unspecified: Secondary | ICD-10-CM

## 2023-07-05 ENCOUNTER — Inpatient Hospital Stay (HOSPITAL_BASED_OUTPATIENT_CLINIC_OR_DEPARTMENT_OTHER): Payer: BC Managed Care – PPO | Admitting: Hematology

## 2023-07-05 ENCOUNTER — Inpatient Hospital Stay: Payer: BC Managed Care – PPO | Attending: Hematology

## 2023-07-05 VITALS — BP 132/86 | HR 86 | Temp 97.3°F | Resp 22 | Ht 70.0 in | Wt 177.9 lb

## 2023-07-05 DIAGNOSIS — D709 Neutropenia, unspecified: Secondary | ICD-10-CM | POA: Diagnosis present

## 2023-07-05 DIAGNOSIS — I253 Aneurysm of heart: Secondary | ICD-10-CM | POA: Diagnosis not present

## 2023-07-05 DIAGNOSIS — Z1231 Encounter for screening mammogram for malignant neoplasm of breast: Secondary | ICD-10-CM | POA: Diagnosis not present

## 2023-07-05 DIAGNOSIS — D696 Thrombocytopenia, unspecified: Secondary | ICD-10-CM | POA: Diagnosis not present

## 2023-07-05 DIAGNOSIS — D72818 Other decreased white blood cell count: Secondary | ICD-10-CM | POA: Diagnosis not present

## 2023-07-05 DIAGNOSIS — J45909 Unspecified asthma, uncomplicated: Secondary | ICD-10-CM | POA: Insufficient documentation

## 2023-07-05 LAB — CBC WITH DIFFERENTIAL (CANCER CENTER ONLY)
Abs Immature Granulocytes: 0 10*3/uL (ref 0.00–0.07)
Basophils Absolute: 0 10*3/uL (ref 0.0–0.1)
Basophils Relative: 1 %
Eosinophils Absolute: 0 10*3/uL (ref 0.0–0.5)
Eosinophils Relative: 1 %
HCT: 35.4 % — ABNORMAL LOW (ref 36.0–46.0)
Hemoglobin: 11.4 g/dL — ABNORMAL LOW (ref 12.0–15.0)
Immature Granulocytes: 0 %
Lymphocytes Relative: 41 %
Lymphs Abs: 0.9 10*3/uL (ref 0.7–4.0)
MCH: 28.2 pg (ref 26.0–34.0)
MCHC: 32.2 g/dL (ref 30.0–36.0)
MCV: 87.6 fL (ref 80.0–100.0)
Monocytes Absolute: 0.3 10*3/uL (ref 0.1–1.0)
Monocytes Relative: 16 %
Neutro Abs: 0.8 10*3/uL — ABNORMAL LOW (ref 1.7–7.7)
Neutrophils Relative %: 41 %
Platelet Count: 118 10*3/uL — ABNORMAL LOW (ref 150–400)
RBC: 4.04 MIL/uL (ref 3.87–5.11)
RDW: 15.2 % (ref 11.5–15.5)
WBC Count: 2.1 10*3/uL — ABNORMAL LOW (ref 4.0–10.5)
nRBC: 0 % (ref 0.0–0.2)

## 2023-07-05 LAB — CMP (CANCER CENTER ONLY)
ALT: 14 U/L (ref 0–44)
AST: 27 U/L (ref 15–41)
Albumin: 3.5 g/dL (ref 3.5–5.0)
Alkaline Phosphatase: 59 U/L (ref 38–126)
Anion gap: 2 — ABNORMAL LOW (ref 5–15)
BUN: 22 mg/dL — ABNORMAL HIGH (ref 6–20)
CO2: 30 mmol/L (ref 22–32)
Calcium: 10 mg/dL (ref 8.9–10.3)
Chloride: 107 mmol/L (ref 98–111)
Creatinine: 1.07 mg/dL — ABNORMAL HIGH (ref 0.44–1.00)
GFR, Estimated: >60 mL/min (ref >60)
Glucose, Bld: 72 mg/dL (ref 70–99)
Potassium: 3.7 mmol/L (ref 3.5–5.1)
Sodium: 139 mmol/L (ref 135–145)
Total Bilirubin: 0.3 mg/dL (ref 0.0–1.2)
Total Protein: 10 g/dL — ABNORMAL HIGH (ref 6.5–8.1)

## 2023-07-05 NOTE — Assessment & Plan Note (Signed)
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  She had a bone marrow biopsy which showed no evidence of dysplasia or other malignancy.  PET scan in October 2015 was also negative for adenopathy or malignancy. -She has not had recurrent infections, or required any treatment, given the mild degree of neutropenia.

## 2023-07-06 NOTE — Progress Notes (Signed)
 [facility]   [phone] [phone]   Clinic Follow up Note   Patient Care Team: [name], MD as PCP - General (Internal Medicine) Pollyann Savoy, MD as Consulting Physician (Rheumatology) Gerald Leitz, MD as Consulting Physician (Obstetrics and Gynecology) Glendale Chard, DO as Consulting Physician (Neurology) [place] Chard, DO as Consulting Physician (Neurology) Malachy Mood, MD as Consulting Physician (Hematology and Oncology)  Date of Service:  07/06/2023  CHIEF COMPLAINT: f/u of leukopenia  CURRENT THERAPY:  Observation  Oncology History   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  She had a bone marrow biopsy which showed no evidence of dysplasia or other malignancy.  PET scan in October 2015 was also negative for adenopathy or malignancy. -She has not had recurrent infections, or required any treatment, given the mild degree of neutropenia.  Assessment and Plan    Neutropenia Chronic neutropenia with current neutrophil count at 0.8, lower than usual levels of 1.1-1.2. Previous hospitalization showed normal white count but low neutrophil count of 1.1. No significant infections in the past year except for a possible pneumonia treated with antibiotics during hospitalization for asthma. Ongoing since 2013. Fluctuation in neutrophil levels likely stress-related, as seen during emergency department visit. - Monitor neutrophil levels with primary care physician in two weeks - Continue annual follow-up with hematology  Thrombocytopenia Mild chronic thrombocytopenia with current platelet count at 118. Previous counts above 100, lowest recorded at 62 in August 2023. No significant bleeding issues reported except for an epistaxis in November 2024, requiring ER visit. Suspected autoimmune-related. No treatment necessary as thrombocytopenia is mild and not causing significant bleeding. Low risk of significant bleeding  unless trauma or injury occurs.  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 Under care of a vascular surgeon.  Asthma Recent exacerbation leading to hospitalization for one day. Under care of a pulmonologist.  Preventive Care Mammogram overdue since last performed in 2022. Colonoscopy completed in 2023. - Order mammogram - Provide contact number for scheduling mammogram      Plan -Lab reviewed, mild neutropenia and thrombocytopenia are overall stable -Lab and follow-up in 1 year    Discussed the use of AI scribe software for clinical note transcription with the patient, who gave verbal consent to proceed.  History of Present Illness   Megan Reid is a 58 year old female with neutropenia who presents for follow-up.  She has chronic neutropenia with neutrophil counts typically ranging from 1.1 to 1.2, currently decreased to 0.8 from 1.1 last month. Despite this, she has not experienced frequent infections, except for a suspected pneumonia during a recent hospitalization for asthma, treated with antibiotics.  Her platelet count is mildly low at 118, with a previous low of 62 in August 2023. It has remained above 100 in the past year. She experienced significant epistaxis in November 2024, requiring an ER visit, but has had no recent episodes.  She was hospitalized for a day due to an asthma exacerbation, during which a 4 cm aneurysm in her heart was discovered. She is under the care of a vascular surgeon and follows up with a pulmonologist for asthma management.         All other systems were reviewed with the patient and are negative.  MEDICAL HISTORY:  Past Medical History:  Diagnosis Date   Arthralgia 02/14/2016   Asthma    Autoimmune disease (HCC) 02/14/2016   Positive RF 139 CCP Negative  Positive ANA 1:640  Positive Ro Positive [place] Elevated ESR 105 - Sjorgen's Disease   DDD (degenerative disc disease), lumbar 02/14/2016    Fatigue 02/14/2016   Gastritis 02/14/2016   Hot flashes    Migraines 02/14/2016   Osteoarthritis of both hands 02/14/2016   Osteoarthritis of both knees 02/14/2016   Vitamin D deficiency 02/14/2016    SURGICAL HISTORY: Past Surgical History:  Procedure Laterality Date   BREAST BIOPSY Left    ROTATOR CUFF REPAIR Right 06/16/2019   TUBAL LIGATION      I have reviewed the social history and family history with the patient and they are unchanged from previous note.  ALLERGIES:  is allergic to carbamazepine, misc. sulfonamide containing compounds, other, strawberry extract, and sulfa antibiotics.  MEDICATIONS:  Current Outpatient Medications  Medication Sig Dispense Refill   albuterol (PROVENTIL HFA;VENTOLIN HFA) 108 (90 Base) MCG/ACT inhaler Inhale 2 puffs into the lungs 2 (two) times daily. For shortness of breath 18 g 0   busPIRone (BUSPAR) 5 MG tablet Take 1 tablet (5 mg total) by mouth 2 (two) times daily. (Patient taking differently: Take 5 mg by mouth 2 (two) times daily. prn) 60 tablet 3   CARDIZEM 60 MG tablet 1 tablet Orally twice a day for 30 days     gabapentin (NEURONTIN) 100 MG capsule Take 1 capsule (100 mg total) by mouth daily. (Patient taking differently: Take 100 mg by mouth daily as needed (knee pain).) 90 capsule 5   gabapentin (NEURONTIN) 100 MG capsule Take 1 capsule by mouth daily as needed (knee pain).     hydrOXYzine (ATARAX) 10 MG tablet Take 1 tablet (10 mg total) by mouth 3 (three) times daily as needed for anxiety. 30 tablet 0   melatonin 5 MG TABS Take 1 tablet (5 mg total) by mouth at bedtime as needed. (Patient taking differently: Take 5 mg by mouth at bedtime as needed (for sleep).) 30 tablet 0   montelukast (SINGULAIR) 10 MG tablet Take 10 mg by mouth daily as needed (for allergies).     Multiple Vitamin (MULTIVITAMIN WITH MINERALS) TABS tablet Take 1 tablet by mouth daily.     mupirocin ointment (BACTROBAN) 2 % Apply 1 Application topically daily as  needed (for rash).     pantoprazole (PROTONIX) 40 MG tablet Take 1 tablet (40 mg total) by mouth daily. 30 tablet 5   SYMBICORT 160-4.5 MCG/ACT inhaler INHALE 2 PUFFS INTO THE LUNGS TWICE DAILY (Patient taking differently: Inhale 2 puffs into the lungs in the morning and at bedtime.) 10.2 g 5   triamcinolone (NASACORT) 55 MCG/ACT AERO nasal inhaler Place 1 spray into the nose 2 (two) times daily as needed (nasal symptoms). 1 each 5   valsartan (DIOVAN) 80 MG tablet Take 80 mg by mouth daily.     No current facility-administered medications for this visit.    PHYSICAL EXAMINATION: ECOG PERFORMANCE STATUS: 0 - Asymptomatic  Vitals:   07/05/23 1442 07/05/23 1449  BP: (!) 150/92 132/86  Pulse: 86   Resp: (!) 22   Temp: (!) 97.3 F (36.3 C)   SpO2: 98%    Wt Readings from Last 3 Encounters:  07/05/23 177 lb 14.4 oz (80.7 kg)  07/02/23 176 lb (79.8 kg)  06/11/23 171 lb 11.8 oz (77.9 kg)     GENERAL:alert, no distress and comfortable SKIN: skin color, texture, turgor are normal, no rashes or significant lesions EYES: normal, Conjunctiva are pink and non-injected, sclera clear Musculoskeletal:no cyanosis of digits and no clubbing  NEURO: alert &  oriented x 3 with fluent speech, no focal motor/sensory deficits   LABORATORY DATA:  I have reviewed the data as listed    Latest Ref Rng & Units 07/05/2023    2:25 PM 06/11/2023   11:30 AM 08/04/2022    3:29 PM  CBC  WBC 4.0 - 10.5 K/uL 2.1  4.6  2.6   Hemoglobin 12.0 - 15.0 g/dL 57.8  46.9  62.9   Hematocrit 36.0 - 46.0 % 35.4  35.2  33.5   Platelets 150 - 400 K/uL 118  111  120         Latest Ref Rng & Units 07/05/2023    2:25 PM 06/11/2023   12:45 PM 06/11/2023   12:25 PM  CMP  Glucose 70 - 99 mg/dL 72  528    BUN 6 - 20 mg/dL 22  13    Creatinine 4.13 - 1.00 mg/dL 2.44  0.10    Sodium 272 - 145 mmol/L 139  133    Potassium 3.5 - 5.1 mmol/L 3.7  3.6    Chloride 98 - 111 mmol/L 107  104    CO2 22 - 32 mmol/L 30  22     Calcium 8.9 - 10.3 mg/dL 53.6  9.4    Total Protein 6.5 - 8.1 g/dL 64.4   03.4   Total Bilirubin 0.0 - 1.2 mg/dL 0.3   0.8   Alkaline Phos 38 - 126 U/L 59   50   AST 15 - 41 U/L 27   36   ALT 0 - 44 U/L 14   18       RADIOGRAPHIC STUDIES: I have personally reviewed the radiological images as listed and agreed with the findings in the report. No results found.    Orders Placed This Encounter  Procedures   MM Digital Screening    Standing Status:   Future    Expected Date:   07/19/2023    Expiration Date:   07/04/2024    Reason for Exam (SYMPTOM  OR DIAGNOSIS REQUIRED):   screening    Is the patient pregnant?:   No    Preferred imaging location?:   [facility]   All questions were answered. The patient knows to call the clinic with any problems, questions or concerns. No barriers to learning was detected. The total time spent in the appointment was 15 minutes.     Malachy Mood, MD 07/06/2023

## 2023-07-12 ENCOUNTER — Ambulatory Visit
Admission: RE | Admit: 2023-07-12 | Discharge: 2023-07-12 | Disposition: A | Discharge status: Home / Self Care | Source: Ambulatory Visit | Attending: Hematology | Admitting: Hematology

## 2023-07-12 DIAGNOSIS — Z1231 Encounter for screening mammogram for malignant neoplasm of breast: Secondary | ICD-10-CM

## 2023-07-19 ENCOUNTER — Ambulatory Visit: Payer: 59 | Admitting: Pulmonary Disease

## 2023-07-19 ENCOUNTER — Encounter: Payer: Self-pay | Admitting: Pulmonary Disease

## 2023-07-19 ENCOUNTER — Telehealth: Payer: Self-pay | Admitting: Pharmacist

## 2023-07-19 VITALS — BP 132/78 | HR 76 | Temp 98.2°F | Ht 69.0 in | Wt 175.6 lb

## 2023-07-19 DIAGNOSIS — J4550 Severe persistent asthma, uncomplicated: Secondary | ICD-10-CM

## 2023-07-19 DIAGNOSIS — Z87891 Personal history of nicotine dependence: Secondary | ICD-10-CM

## 2023-07-19 DIAGNOSIS — R590 Localized enlarged lymph nodes: Secondary | ICD-10-CM

## 2023-07-19 DIAGNOSIS — I712 Thoracic aortic aneurysm, without rupture, unspecified: Secondary | ICD-10-CM | POA: Diagnosis not present

## 2023-07-19 DIAGNOSIS — K219 Gastro-esophageal reflux disease without esophagitis: Secondary | ICD-10-CM

## 2023-07-19 DIAGNOSIS — M3500 Sicca syndrome, unspecified: Secondary | ICD-10-CM | POA: Diagnosis not present

## 2023-07-19 MED ORDER — PANTOPRAZOLE SODIUM 40 MG PO TBEC
40.0000 mg | DELAYED_RELEASE_TABLET | Freq: Every day | ORAL | 5 refills | Status: AC
Start: 2023-07-19 — End: ?

## 2023-07-19 MED ORDER — SPIRIVA RESPIMAT 2.5 MCG/ACT IN AERS: 2.0000 | INHALATION_SPRAY | Freq: Every day | RESPIRATORY_TRACT | 5 refills | Status: AC

## 2023-07-19 NOTE — Telephone Encounter (Addendum)
 Submitted a Prior Authorization request to [facility] for TEZSPIRE via CoverMyMeds. Will update once we receive a response.  Key: [id number]   Please reach out to the Pharmacy Help Desk for further assistance [phone]  [facility] help desk to initiate PA for Tezspire pen. Rep will fax PA form  Phone: [phone] Case # [id number]  Chesley Mires, PharmD, MPH, BCPS, CPP Clinical Pharmacist (Rheumatology and Pulmonology)  ----- Message from [facility] sent at 07/19/2023 12:17 PM EDT ----- Can you please check her medication list.  She is telling me that she has been taking tezspire for the past year but I do not see any notes from our office.  Her prescription stopped in December and will need to restart as she has asthma exacerbation.

## 2023-07-19 NOTE — Progress Notes (Signed)
 We have not prescribed Tezspire because patient was not interested in making the switch from Xolair based on notes that I am seeing. There is no documentation of our clinic sending a prescription for this medication. We will have to start a new benefits investigation

## 2023-07-19 NOTE — Progress Notes (Signed)
 Megan Reid    [id number]    [birth year]  Primary Care Physician:Varadarajan, Soyla Murphy, MD  Referring Physician: [name], MD [address] [place],  [place] [address]  Chief complaint:  Follow up for severe persistent asthma Started Xolair, November 2021 Switched to [place] in 2024  HPI: 58 y.o.  with history of asthma, autoimmune disease with rheumatoid arthritis, Sjogren's syndrome  Previously followed by Dr. Corliss Skains and rheumatology at [facility].  She was on plaquenil, methotrexate and folic acid, prednisone.  She has not followed up with rheumatology since 2018 and is currently not on treatment.  Last rheumatology note was from [facility] in 2018 where they discussed possibly needing leflunomide or CellCept.  But she has not followed back since the co-pay was high at [facility] She had a high-res CT in 2018 with no evidence of interstitial lung disease.  CTA in December 2019 showed some groundglass opacities at the bases but no follow-up CT was done  Has history of asthma for which she is on Symbicort but is using it 2 puffs once a day.  Also has significant allergic rhinitis, GERD Started on Xolair in 2021 but with improvement in the frequency of asthma exacerbations continues to have high symptom burden with persistent dyspnea, needing to use rescue medication several times a day and persistent cough We discussed changing Biologics to tezspire but she is reluctant to make the switch  Follows with Dr. Selena Batten for food allergies She is being followed by Dr. Clelia Croft for thyroid nodules and ultrasound has been ordered  Pets: Has a cat.  No birds, farm animal Occupation: Runner, broadcasting/film/video for fourth grade Exposures: Reports mold exposure at school but currently she is working remotely from home.  She also has a comfortable.  No hot tubs, Jacuzzi's, humidifier Smoking history: Remote smoking history as a teenager Travel history: Previously lived in [place], [place].   She has been in [place] for the past 7 years.  No significant recent travel Relevant family history: Father had emphysema.  He was a smoker.  Interim history: Discussed the use of AI scribe software for clinical note transcription with the patient, who gave verbal consent to proceed.  History of Present Illness Megan Reid is a 58 year old female with severe asthma who presents with worsening asthma symptoms.  She has a history of severe asthma and was hospitalized in February for an exacerbation, during which she received oxygen, steroids, and antibiotics, leading to improvement. Since discharge, her breathing has been unstable, and she perceives a significant decline in asthma control. She was on Xolair and switched to Tezspire over a year ago but has not received it since December due to a prescription lapse, which she believes may be contributing to her current symptoms. She is currently taking Symbicort and Singulair for asthma management. She was previously on Spiriva but has not used it recently. She administers her asthma medication at home and receives it by mail.  She also has a history of allergic rhinitis and acid reflux. Her acid reflux has been exacerbating, and she recently ran out of her Protonix prescription, which she takes once daily. Her sinus issues are under control, and she is not currently using Flonase or any antihistamines.  She has a family history of aneurysm; her mother had an aneurysm and passed away at the age of 8 after complications with fluid on her lungs. She was recently diagnosed with a 4 cm aneurysm  and was advised that no immediate intervention is necessary. She is concerned about fluid on her lungs, as one doctor mentioned seeing fluid, but subsequent CT scans did not confirm this finding.    Outpatient Encounter Medications as of 07/19/2023  Medication Sig   albuterol (PROVENTIL HFA;VENTOLIN HFA) 108 (90 Base) MCG/ACT inhaler Inhale 2 puffs into  the lungs 2 (two) times daily. For shortness of breath   busPIRone (BUSPAR) 5 MG tablet Take 1 tablet (5 mg total) by mouth 2 (two) times daily. (Patient taking differently: Take 5 mg by mouth 2 (two) times daily. prn)   CARDIZEM 60 MG tablet 1 tablet Orally twice a day for 30 days   gabapentin (NEURONTIN) 100 MG capsule Take 1 capsule by mouth daily as needed (knee pain).   hydrOXYzine (ATARAX) 10 MG tablet Take 1 tablet (10 mg total) by mouth 3 (three) times daily as needed for anxiety.   melatonin 5 MG TABS Take 1 tablet (5 mg total) by mouth at bedtime as needed. (Patient taking differently: Take 5 mg by mouth at bedtime as needed (for sleep).)   montelukast (SINGULAIR) 10 MG tablet Take 10 mg by mouth daily as needed (for allergies).   Multiple Vitamin (MULTIVITAMIN WITH MINERALS) TABS tablet Take 1 tablet by mouth daily.   mupirocin ointment (BACTROBAN) 2 % Apply 1 Application topically daily as needed (for rash).   SYMBICORT 160-4.5 MCG/ACT inhaler INHALE 2 PUFFS INTO THE LUNGS TWICE DAILY (Patient taking differently: Inhale 2 puffs into the lungs in the morning and at bedtime.)   triamcinolone (NASACORT) 55 MCG/ACT AERO nasal inhaler Place 1 spray into the nose 2 (two) times daily as needed (nasal symptoms).   valsartan (DIOVAN) 80 MG tablet Take 80 mg by mouth daily.   gabapentin (NEURONTIN) 100 MG capsule Take 1 capsule (100 mg total) by mouth daily. (Patient taking differently: Take 100 mg by mouth daily as needed (knee pain).)   pantoprazole (PROTONIX) 40 MG tablet Take 1 tablet (40 mg total) by mouth daily. (Patient not taking: Reported on 07/19/2023)   No facility-administered encounter medications on file as of 07/19/2023.   Physical Exam: Blood pressure 132/78, pulse 76, temperature 98.2 F (36.8 C), temperature source Oral, height 5\' 9"  (1.753 m), weight 175 lb 9.6 oz (79.7 kg), last menstrual period 05/22/2012, SpO2 96%. Gen:      No acute distress HEENT:  EOMI, sclera  anicteric Neck:     No masses; no thyromegaly Lungs:    Clear to auscultation bilaterally; normal respiratory effort CV:         Regular rate and rhythm; no murmurs Abd:      + bowel sounds; soft, non-tender; no palpable masses, no distension Ext:    No edema; adequate peripheral perfusion Skin:      Warm and dry; no rash Neuro: alert and oriented x 3 Psych: normal mood and affect   Data Reviewed: Imaging: CT high-resolution 09/21/2016-biapical scarring.  No evidence of interstitial lung disease.  4 mm nodule in the left major fissure. CTA 04/14/2018 no PE, patchy groundglass opacities and reticulation in the mid to lower lungs. Chest x-ray 04/26/2018-no active cardiopulmonary disease CTA 12/05/2019-no PE, subsegmental atelectasis, stable reticular changes in the right middle lobe and right lower lobe.  CT high-resolution 01/14/2020-interstitial opacity in the right middle lobe, minimal scarring.  No evidence of ILD CT 03/18/2021-mild patchy groundglass opacities which are unchanged, interval resolution of left lower lobe lung nodule, new 3 mm nodule in the left lower  lobe. CT scan 03/20/2022-no interstitial lung disease, increase in mediastinal axillary and upper abdominal lymph node.  3 mm lung nodule PET scan 05/24/2022-small bilateral axillary and mediastinal lymphadenopathy with no uptake High-res CT 05/24/2023-bibasal scarring, small mediastinal subpectoral and axillary lymph nodes which are stable CTA 06/11/2023-no pulm embolism, 4 cm ascending thoracic aortic aneurysm I have reviewed the images personally.  PFTs  04/09/2019 FVC 3.60 [102%], FEV1 2.73 [97%], F/F 76, TLC 5.09 [5%], DLCO 17.46 [69%]  02/04/2019 FVC 3.40 [97%], FEV1 2.67 [96%], F/F 79, TLC 5.31 [89%], DLCO 17.65 [70%] Minimal obstructive airway disease with minimal diffusion defect  02/04/2020 FVC 3.40 [19%], FEV1 2.67 [96%], F/F 79, TLC 5.31 [9%], DLCO 17.65 [90%], Minimal obstruction, minimal diffusion defect  ACT score  01/09/2020-10 ACT score 03/10/2020-11 ACT score 08/06/2020-12 ACT score 12/03/20- 15  Labs: CBC 10/09/2018-WBC 3.6, hemoglobin 9.5, platelets 129, eos 0% CBC 02/04/2020-WBC 2.2, eos 0% IgE 12/30/2019-1726  Hypersensitivity panel 02/07/2019-negative Assessment & Plan Asthma exacerbation Severe asthma with recent exacerbation requiring hospitalization in February. Difficulty managing asthma and symptom decline potentially due to a lapse in tezspire since December. Current medications include Symbicort and Singulair. Previously on Spiriva, which is not currently being taken. Need to verify Tezspire prescription status as it may contribute to exacerbation.  It is not clear why she is getting it from as there are no pharmacy notes from the pulmonary office.  - Check with pharmacy team to restart Teszpire - Prescribe Spiriva. - Continue Symbicort and Singulair.  Acid reflux (GERD) Exacerbation of acid reflux symptoms with depletion of Protonix. Previously on twice daily dosing, now taking once daily in the morning. - Prescribe Protonix once daily. - Send prescription to [facility] in [place].  Allergic rhinitis Allergic rhinitis is well-controlled. Not currently taking Flonase or any antihistamines.  Thoracic aortic aneurysm 4 cm thoracic aortic aneurysm with no immediate intervention necessary per previous surgical consultation. Concern due to family history of aneurysm and fluid on lungs, but recent CT scans show no fluid accumulation. CT scan is more accurate than initial chest x-ray, which suggested possible fluid. - Monitor aneurysm size and symptoms. - Reassure regarding CT findings showing no fluid on lungs.  Sjogren's syndrome Following up at [facility] for Sjogren's syndrome. Opting for natural management. No evidence of interstitial lung disease on high-res CT this year  Mediastinal lymphadenopathy Previous lymph node enlargement noted, but recent scans show no significant change,  suggesting benign condition.  PET scan in 2024 was unremarkable.  Follow-up Scheduled for follow-up in three months.    Plan/Recommendations: Symbicort, Singulair.  Add Spiriva Check status of tezspire Protonix for acid reflux  Chilton Greathouse MD [facility] 07/19/2023, 11:49 AM  CC: [name],*

## 2023-07-19 NOTE — Patient Instructions (Addendum)
 VISIT SUMMARY:  Today, we discussed your worsening asthma symptoms, acid reflux, and other health concerns. We reviewed your current medications and made some adjustments to help manage your conditions more effectively.  YOUR PLAN:  -ASTHMA EXACERBATION: Asthma exacerbation means your asthma symptoms have worsened. We believe this may be due to a lapse in your Tezspire medication. We will check with the pharmacy team to restart your Tezspire, prescribe Spiriva, and continue your current medications, Symbicort and Singulair.  -ACID REFLUX (GERD): Acid reflux, also known as GERD, occurs when stomach acid frequently flows back into the tube connecting your mouth and stomach. We will prescribe Protonix once daily to help manage your symptoms and send the prescription to [facility] in [place].  -ALLERGIC RHINITIS: Allergic rhinitis is an allergic reaction that causes sneezing, congestion, and a runny nose. Your condition is well-controlled, and you are not currently taking Flonase or any antihistamines.  -THORACIC AORTIC ANEURYSM: A thoracic aortic aneurysm is an abnormal bulge in the wall of the aorta in the chest. Your aneurysm is 4 cm in size, and no immediate intervention is necessary. We will monitor its size and symptoms, and recent CT scans show no fluid on your lungs.  -SJOGREN'S SYNDROME: Sjogren's syndrome is an immune system disorder characterized by dry eyes and dry mouth. You are following up at [facility] and opting for natural management.  -LYMPHADENOPATHY: Lymphadenopathy refers to the enlargement of lymph nodes. Recent scans show no significant enlargement, suggesting a benign condition.  INSTRUCTIONS:  Please schedule a follow-up appointment in three months. We will monitor your conditions and adjust your treatment plan as needed.

## 2023-07-19 NOTE — Telephone Encounter (Signed)
 Submitted a Prior Authorization request to [facility] for TEZSPIRE via FAX. Will update once we receive a response.  Fax: [phone] Phone: [phone] Case # [id number]

## 2023-07-22 NOTE — Telephone Encounter (Signed)
 Received notification from [facility] regarding a prior authorization for TEZSPIRE. Authorization has been APPROVED from 07/19/2023 to 01/18/2024. Approval letter sent to scan center.  Patient must fill through [facility]: [phone]  Authorization # [id number]  Patient has Tezspire copay card. She will need to call to collect ID, BIN, PCN, Group to provide to specialty pharmacy  Phone: [phone]

## 2023-07-26 MED ORDER — TEZSPIRE 210 MG/1.91ML ~~LOC~~ SOAJ: 210.0000 mg | SUBCUTANEOUS | 5 refills | Status: AC

## 2023-07-26 NOTE — Telephone Encounter (Signed)
 Spoke with patient - she states that she is now unsure who prescribed Tezspire (may have been allergy and asthma). She states she was on Xolair and at some point switched to [place] once it became available as an autoinjector.  Regardless, her last dose was in Dec 2024. She confirms it was an autoinjector ("like an Epipen"). [facility]. Patient advised to call [facility] and set up shipment  Chesley Mires, PharmD, MPH, BCPS, CPP Clinical Pharmacist (Rheumatology and Pulmonology)

## 2023-08-14 NOTE — Progress Notes (Deleted)
 Follow-up Visit   Date: 08/14/23   Megan Reid MRN: [id number] DOB: 11/17/1965   Interim History: Megan Reid is a 58 y.o. right-handed female with asthma, Sjogren's disease, cytopenia, and lumbar canal stenosis  returning to the clinic with complaints of right facial pain and feet paresthesias.  The patient was accompanied to the clinic by self.  IMPRESSION/PLAN: Lumbar canal stenosis at L5-S1 causing bilateral feet paresthesias.  NCS/EMG of the legs from Jan 2023 shows chronic L5 radiculopathy, no evidence of neuropathy.  She had NCS/EMG repeated recently with PM&R, however I am unable to see those results to review.   Neurological exam does not have signs of neuropathy.  - Continue gabapentin  100mg  at bedtime  - Start physical therapy for low back strengthening  2.  Right trigeminal nerve pain secondary to localized nerve irritation due to proximity of the parotid gland secondary to Sjogren's disease.  Pain has significantly improved and she no longer has hyperesthesia over the face. Previously on carbamazepine  200mg  daily which was stopped due to hospitalization of acute hepatitis, acute renal failure, and encephaloathy  - Continue gabapentin  100mg  at bedtime as needed  3.  Bilateral medial leg pain and erythema is not related to primary neurological condition.   Return to clinic in 6 months  -------------------------------------------------------------- History of present illness:  In June 2023, she began having right side ear pain and diagnosed with swimmer's ear. She has numbness over the right side over the cheek, jaw, upper and lower lips. No pain involving the nose or forehead.  She complains of skin hypersensitivity on the right cheek.  She denies shooting pain over the right side of the face. She was treated with antibiotics which would temporarily relieve her pain and discomfort.  She saw ENT who ordered CT which showed heterogeneity of the parotid and  submandibular glands, consistent with Sjogren's syndrome.  She has been treated with several cycles of antibiotics and steroids, which resolves symptoms when she is taking it, but once the course has been completed, her right parotid fullness and facial pain returned.   UPDATE 04/28/2022:  At her last visit in August, she was started on carbamazpine for her facial pain.  A month later, she was hospitalized with acute hepatitis, encephalopathy, and pancytopenia thought to be carbamazepine -induced.  She was briefly in the ICU and treated supportively after which she was discharged to rehab. She is here because her bilateral feet tingling and pain has became worse since her hospitalization. She also continues to have low back pain.  She was given gabapentin  100mg  at bedtime, but has not started this until she was seen by me. She continues to have right jaw sensitivity.    UPDATE 08/09/2022:  She started PT which has helped with feet sensation and low back pain.  Right facial pain improved so now she takes gabapentin  100mg  only as needed, usually 2-3 times per week. Overall, she has been doing better than before.   Recently, she has been having left lower leg pain, soreness, and tenderness.  She is scheduled to have US  of the leg to evaluate for DVT.   UPDATE 02/14/2023:  She is here for follow-up.  She is complaining of tenderness and warmth in the medial lower legs. She also notes redness over the same area.  She continues to have numbness in the feet and had NCS/EMG of the legs with PM&R.  Results are not available for review.  She was not contact to do PT at  her last visit and would like to restart this.  She reports numbness in the feet, worse in the right foot.    Medications:  Current Outpatient Medications on File Prior to Visit  Medication Sig Dispense Refill   albuterol  (PROVENTIL  HFA;VENTOLIN  HFA) 108 (90 Base) MCG/ACT inhaler Inhale 2 puffs into the lungs 2 (two) times daily. For shortness of  breath 18 g 0   busPIRone  (BUSPAR ) 5 MG tablet Take 1 tablet (5 mg total) by mouth 2 (two) times daily. (Patient taking differently: Take 5 mg by mouth 2 (two) times daily. prn) 60 tablet 3   CARDIZEM 60 MG tablet 1 tablet Orally twice a day for 30 days     gabapentin  (NEURONTIN ) 100 MG capsule Take 1 capsule (100 mg total) by mouth daily. (Patient taking differently: Take 100 mg by mouth daily as needed (knee pain).) 90 capsule 5   gabapentin  (NEURONTIN ) 100 MG capsule Take 1 capsule by mouth daily as needed (knee pain).     hydrOXYzine  (ATARAX ) 10 MG tablet Take 1 tablet (10 mg total) by mouth 3 (three) times daily as needed for anxiety. 30 tablet 0   melatonin 5 MG TABS Take 1 tablet (5 mg total) by mouth at bedtime as needed. (Patient taking differently: Take 5 mg by mouth at bedtime as needed (for sleep).) 30 tablet 0   montelukast  (SINGULAIR ) 10 MG tablet Take 10 mg by mouth daily as needed (for allergies).     Multiple Vitamin (MULTIVITAMIN WITH MINERALS) TABS tablet Take 1 tablet by mouth daily.     mupirocin  ointment (BACTROBAN ) 2 % Apply 1 Application topically daily as needed (for rash).     pantoprazole  (PROTONIX ) 40 MG tablet Take 1 tablet (40 mg total) by mouth daily. 30 tablet 5   SYMBICORT  160-4.5 MCG/ACT inhaler INHALE 2 PUFFS INTO THE LUNGS TWICE DAILY (Patient taking differently: Inhale 2 puffs into the lungs in the morning and at bedtime.) 10.2 g 5   Tezepelumab -ekko (TEZSPIRE ) 210 MG/1. SOAJ Inject 210 mg into the skin every 28 (twenty-eight) days. 1.91 mL 5   Tiotropium Bromide Monohydrate  (SPIRIVA  RESPIMAT) 2.5 MCG/ACT AERS Inhale 2 puffs into the lungs daily. 1 g 5   triamcinolone  (NASACORT ) 55 MCG/ACT AERO nasal inhaler Place 1 spray into the nose 2 (two) times daily as needed (nasal symptoms). 1 each 5   valsartan (DIOVAN) 80 MG tablet Take 80 mg by mouth daily.     No current facility-administered medications on file prior to visit.    Allergies:  Allergies   Allergen Reactions   Carbamazepine  Other (See Comments)    Suspected hepatotoxicity and pancytopenia, required inpatient admission 11/2021  Liver and kidney failure   Misc. Sulfonamide Containing Compounds Anaphylaxis, Hives, Rash and Swelling    Swelling of the face and tongue , Swelling of the face and tongue   Other Hives, Itching, Shortness Of Breath and Swelling   Strawberry Extract Other (See Comments), Itching and Rash    Unknown reaction  Unknown reaction, Unknown reaction, Unknown reaction   Sulfa Antibiotics Anaphylaxis, Hives and Swelling    Swelling of the face and tongue     Vital Signs:  LMP 05/22/2012   Neurological Exam: MENTAL STATUS including orientation to time, place, person, recent and remote memory, attention span and concentration, language, and fund of knowledge is normal.  Speech is not dysarthric.  CRANIAL NERVES:  Pupils equal round and reactive to light.  Normal conjugate, extra-ocular eye movements in all directions of gaze.  No ptosis. Sensation is intact.  Palate elevates symmetrically.  Tongue is midline.  MOTOR:  Motor strength is 5/5 in all extremities.  No atrophy, fasciculations or abnormal movements.  No pronator drift.  Tone is normal.    MSRs:  Reflexes are 2+/4 throughout, except 3+/4 at bilateral patellas  SENSORY:  Intact to vibration throughout. Rhomberg testing is negative.   COORDINATION/GAIT:  Normal finger-to- nose-finger. Gait narrow based and stable. Tandem gait intact.   Data: MRI lumbar spine wo contrast 04/08/2021: No change demonstrated since the study of 2015. Markedly exaggerated lumbosacral lordosis. Chronic degenerative anterolisthesis at L5-S1 of 8 mm. Note that lumbosacral anatomy is transitional, and the same numbering terminology was used as on the previous examination.   L3-4: Disc bulge. Mild facet and ligamentous hypertrophy. Mild lateral recess narrowing without visible neural compression.   L4-5: Disc bulge.  Facet and ligamentous hypertrophy. Mild stenosis of both lateral recesses but without visible neural compression.   L5-S1: Chronic degenerative anterolisthesis of 8 mm. Chronic disc degeneration with pseudo disc herniation. Stenosis of the canal and neural foramina which could possibly cause neural compression.  CT soft tissue neck w contrast 11/08/2021: 1. Heterogeneity of the parotid and submandibular glands, consistent with the patient's diagnosis of Sjogren syndrome, without sialolithiasis or evidence of inflammatory changes or ductal dilatation to suggest sialadenitis. 2. 1.5 cm hypoenhancing nodule in the left thyroid lobe, previously evaluated with ultrasound on 05/12/2021.  NCS/EMG of the legs 05/05/2021: Chronic L5 radiculopathy affecting the right lower extremity, mild-to-moderate. There has been mild improvement as compared to prior study on 04/24/2017.   In particular, there is no evidence of a sensorimotor polyneuropathy affecting the lower extremities.     Thank you for allowing me to participate in patient's care.  If I can answer any additional questions, I would be pleased to do so.    Sincerely,    Wynee Matarazzo K. Lydia Sams, DO

## 2023-08-15 ENCOUNTER — Ambulatory Visit: Payer: BC Managed Care – PPO | Admitting: Neurology

## 2023-08-15 ENCOUNTER — Encounter: Payer: Self-pay | Admitting: Neurology

## 2023-08-15 DIAGNOSIS — Z029 Encounter for administrative examinations, unspecified: Secondary | ICD-10-CM

## 2023-12-25 ENCOUNTER — Telehealth: Payer: Self-pay

## 2023-12-25 NOTE — Telephone Encounter (Signed)
 Received faxed PA renewal form for Tezspire  from [facility]. Pt has not been seen by our clinic since April of this year and has since been seen by Dr. Diana with [facility] and is not scheduled any future appointments with Dr. Theophilus.  Contacted pt and discussed, she informs me that she has fully transitioned care to [facility]. I informed her that her current PA will expire on 01/18/24, however her most recent chart notes do not contain documentation that would support positive clinical response (as to be expected, considering that she had only just restarted less than two months prior) and that this would likely result in her PA renewal being denied.  I advised pt to make every effort to refill medication again prior to 10/3 to ensure that she does not experience a lapse in treatment leading up to her 01/30/24 appointment, during which documentation can hopefully be obtained that supports PA renewal. She states she just recently received a shipment, so the timing for the refill should work out as long as she is proactive about it. She verbalized understanding.  Will fax PA renewal form to Dr. Anibal office for follow through.

## 2024-01-25 ENCOUNTER — Other Ambulatory Visit: Payer: Self-pay | Admitting: Obstetrics and Gynecology

## 2024-01-25 ENCOUNTER — Other Ambulatory Visit (HOSPITAL_COMMUNITY)
Admission: RE | Admit: 2024-01-25 | Discharge: 2024-01-25 | Disposition: A | Discharge status: Home / Self Care | Source: Ambulatory Visit | Attending: Obstetrics and Gynecology | Admitting: Obstetrics and Gynecology

## 2024-01-25 DIAGNOSIS — Z01419 Encounter for gynecological examination (general) (routine) without abnormal findings: Secondary | ICD-10-CM | POA: Diagnosis present

## 2024-01-29 ENCOUNTER — Other Ambulatory Visit: Payer: Self-pay | Admitting: Pulmonary Disease

## 2024-01-29 DIAGNOSIS — J4550 Severe persistent asthma, uncomplicated: Secondary | ICD-10-CM

## 2024-01-30 LAB — CYTOLOGY - PAP
Comment: NEGATIVE
Diagnosis: NEGATIVE
High risk HPV: NEGATIVE

## 2024-01-30 NOTE — Telephone Encounter (Signed)
 Pt requesting refill of specialty medication - routing to Rx team to advise.

## 2024-01-30 NOTE — Telephone Encounter (Signed)
 Received refill request for Tezspire .   From chart review, patient is now seen at [facility] with Dr. Diana.   Called patient to discuss - she confirms she has transitioned care to Dr. Diana. Headed to a pulmonary appointment with Dr. Diana today. Reports her next dose of Tezspire  is due in 28 days as she just used her last dose.   Advised patient to discuss refill with Dr. Diana. If she has questions, can call [phone] if she has further needs from our office.   Patient verbalizes understanding and agreement with plan.  Aleck Puls, PharmD, BCPS, CPP Clinical Pharmacist  [facility]

## 2024-03-24 ENCOUNTER — Other Ambulatory Visit: Payer: Self-pay | Admitting: Nurse Practitioner

## 2024-03-24 DIAGNOSIS — R102 Pelvic and perineal pain unspecified side: Secondary | ICD-10-CM

## 2024-04-07 ENCOUNTER — Inpatient Hospital Stay: Admission: RE | Admit: 2024-04-07 | Discharge: 2024-04-07 | Attending: Nurse Practitioner

## 2024-04-07 DIAGNOSIS — R102 Pelvic and perineal pain unspecified side: Secondary | ICD-10-CM

## 2024-06-06 ENCOUNTER — Other Ambulatory Visit (HOSPITAL_COMMUNITY)

## 2024-06-30 ENCOUNTER — Ambulatory Visit: Admitting: Neurology

## 2024-07-03 ENCOUNTER — Other Ambulatory Visit

## 2024-07-03 ENCOUNTER — Ambulatory Visit: Admitting: Hematology

## 2024-09-24 ENCOUNTER — Ambulatory Visit: Admitting: Neurology
